# Patient Record
Sex: Male | Born: 1935
Health system: Southern US, Community
[De-identification: ages and names within clinical notes are randomized; demographics above are authoritative.]

## PROBLEM LIST (undated history)

## (undated) DIAGNOSIS — N183 Chronic kidney disease, stage 3 (moderate): Secondary | ICD-10-CM

## (undated) DIAGNOSIS — C61 Malignant neoplasm of prostate: Secondary | ICD-10-CM

## (undated) DIAGNOSIS — J189 Pneumonia, unspecified organism: Secondary | ICD-10-CM

## (undated) DIAGNOSIS — Z8719 Personal history of other diseases of the digestive system: Secondary | ICD-10-CM

## (undated) DIAGNOSIS — K8050 Calculus of bile duct without cholangitis or cholecystitis without obstruction: Secondary | ICD-10-CM

## (undated) DIAGNOSIS — I2510 Atherosclerotic heart disease of native coronary artery without angina pectoris: Secondary | ICD-10-CM

## (undated) DIAGNOSIS — K219 Gastro-esophageal reflux disease without esophagitis: Secondary | ICD-10-CM

## (undated) DIAGNOSIS — I4892 Unspecified atrial flutter: Secondary | ICD-10-CM

## (undated) DIAGNOSIS — I4891 Unspecified atrial fibrillation: Secondary | ICD-10-CM

## (undated) DIAGNOSIS — E039 Hypothyroidism, unspecified: Secondary | ICD-10-CM

## (undated) DIAGNOSIS — M1990 Unspecified osteoarthritis, unspecified site: Secondary | ICD-10-CM

## (undated) DIAGNOSIS — E785 Hyperlipidemia, unspecified: Secondary | ICD-10-CM

## (undated) DIAGNOSIS — I499 Cardiac arrhythmia, unspecified: Secondary | ICD-10-CM

## (undated) DIAGNOSIS — I10 Essential (primary) hypertension: Secondary | ICD-10-CM

## (undated) DIAGNOSIS — N189 Chronic kidney disease, unspecified: Secondary | ICD-10-CM

## (undated) HISTORY — PX: PROSTATE SURGERY: SHX751

## (undated) HISTORY — DX: Unspecified osteoarthritis, unspecified site: M19.90

## (undated) HISTORY — PX: THORACOTOMY: SUR1349

## (undated) HISTORY — DX: Essential (primary) hypertension: I10

## (undated) HISTORY — DX: Gastro-esophageal reflux disease without esophagitis: K21.9

## (undated) HISTORY — PX: COLONOSCOPY: SHX174

## (undated) HISTORY — PX: CATARACT EXTRACTION: SUR2

## (undated) HISTORY — DX: Malignant neoplasm of prostate: C61

## (undated) HISTORY — PX: HERNIA REPAIR: SHX51

## (undated) HISTORY — PX: EYE SURGERY: SHX253

## (undated) HISTORY — DX: Hyperlipidemia, unspecified: E78.5

---

## 1998-11-09 ENCOUNTER — Inpatient Hospital Stay (HOSPITAL_COMMUNITY): Admission: EM | Admit: 1998-11-09 | Discharge: 1998-11-10 | Payer: Self-pay | Admitting: Emergency Medicine

## 1998-11-10 ENCOUNTER — Encounter: Payer: Self-pay | Admitting: *Deleted

## 2002-07-29 ENCOUNTER — Ambulatory Visit (HOSPITAL_COMMUNITY): Admission: RE | Admit: 2002-07-29 | Discharge: 2002-07-29 | Payer: Self-pay | Admitting: Gastroenterology

## 2002-07-29 ENCOUNTER — Encounter: Payer: Self-pay | Admitting: Gastroenterology

## 2011-12-07 ENCOUNTER — Other Ambulatory Visit: Payer: Self-pay | Admitting: Family Medicine

## 2011-12-07 ENCOUNTER — Encounter: Payer: Self-pay | Admitting: Gastroenterology

## 2011-12-07 DIAGNOSIS — R109 Unspecified abdominal pain: Secondary | ICD-10-CM

## 2011-12-07 DIAGNOSIS — R102 Pelvic and perineal pain: Secondary | ICD-10-CM

## 2011-12-13 ENCOUNTER — Ambulatory Visit (HOSPITAL_COMMUNITY)
Admission: RE | Admit: 2011-12-13 | Discharge: 2011-12-13 | Disposition: A | Discharge status: Home / Self Care | Payer: Medicare PPO | Source: Ambulatory Visit | Attending: Family Medicine | Admitting: Family Medicine

## 2011-12-13 DIAGNOSIS — R102 Pelvic and perineal pain: Secondary | ICD-10-CM

## 2011-12-13 DIAGNOSIS — R109 Unspecified abdominal pain: Secondary | ICD-10-CM

## 2011-12-13 DIAGNOSIS — K8020 Calculus of gallbladder without cholecystitis without obstruction: Secondary | ICD-10-CM | POA: Insufficient documentation

## 2012-01-07 ENCOUNTER — Encounter: Payer: Self-pay | Admitting: Gastroenterology

## 2012-01-15 ENCOUNTER — Ambulatory Visit (INDEPENDENT_AMBULATORY_CARE_PROVIDER_SITE_OTHER): Payer: Medicare PPO | Admitting: Gastroenterology

## 2012-01-15 ENCOUNTER — Encounter: Payer: Self-pay | Admitting: Gastroenterology

## 2012-01-15 VITALS — BP 124/60 | HR 62 | Ht 70.0 in | Wt 161.0 lb

## 2012-01-15 DIAGNOSIS — C61 Malignant neoplasm of prostate: Secondary | ICD-10-CM

## 2012-01-15 DIAGNOSIS — Z1211 Encounter for screening for malignant neoplasm of colon: Secondary | ICD-10-CM

## 2012-01-15 DIAGNOSIS — R634 Abnormal weight loss: Secondary | ICD-10-CM | POA: Insufficient documentation

## 2012-01-15 MED ORDER — PEG-KCL-NACL-NASULF-NA ASC-C 100 G PO SOLR: 1.0000 | Freq: Once | ORAL | Status: DC

## 2012-01-15 NOTE — Progress Notes (Signed)
History of Present Illness: [name] is a pleasant 78 year old white male referred at the request of Dr. Christell Constant for evaluation of weight loss. Over the past 3 months he's lost about 12 pounds. This is coincident with the loss of his wife. He was complaining of severe anorexia which  has improved.  He's had mild pyrosis. Recent lab work in April, 2013 was pertinent for normal liver tests and CBC. Last colonoscopy was 10 years ago.    Past Medical History  Diagnosis Date  . Prostate cancer   . GERD (gastroesophageal reflux disease)   . Hypertension   . Hyperlipemia   . Arthritis    Past Surgical History  Procedure Date  . Hernia repair   . Prostate surgery    family history is negative for Colon cancer. Current Outpatient Prescriptions  Medication Sig Dispense Refill  . amLODipine (NORVASC) 5 MG tablet One tablet by mouth once daily      . DEPO-TESTOSTERONE 200 MG/ML injection As directed      . diazepam (VALIUM) 5 MG tablet Take 5 mg by mouth as needed.      . hydrochlorothiazide (HYDRODIURIL) 25 MG tablet Takes one tablet by mouth once daily      . HYDROcodone-acetaminophen (NORCO) 5-325 MG per tablet Takes as directed      . lisinopril (PRINIVIL,ZESTRIL) 40 MG tablet One tablet by mouth once daily      . metoprolol tartrate (LOPRESSOR) 25 MG tablet One tablet by mouth once daily      . omeprazole (PRILOSEC) 40 MG capsule Takes one capsule by mouth once daily      . pramipexole (MIRAPEX) 1 MG tablet Takes as directed      . pravastatin (PRAVACHOL) 40 MG tablet Takes one tablet by mouth once daily      . sertraline (ZOLOFT) 50 MG tablet Takes as directed      . zolpidem (AMBIEN) 10 MG tablet Take 10 mg by mouth at bedtime as needed.       Allergies as of 01/15/2012 - Review Complete 01/15/2012  Allergen Reaction Noted  . Lipitor (atorvastatin)  01/15/2012    reports that he has quit smoking. He has never used smokeless tobacco. He reports that he does not drink alcohol or use  illicit drugs.     Review of Systems: Pertinent positive and negative review of systems were noted in the above HPI section. All other review of systems were otherwise negative.  Vital signs were reviewed in today's medical record Physical Exam: General: Well developed , well nourished, no acute distress Head: Normocephalic and atraumatic Eyes:  sclerae anicteric, EOMI Ears: Normal auditory acuity Mouth: No deformity or lesions Neck: Supple, no masses or thyromegaly Lungs: There were scattered inspiratory and expiratory wheezes Heart: Regular rate and rhythm; no murmurs, rubs or bruits Abdomen: Soft, non tender and non distended. No masses, hepatosplenomegaly or hernias noted. Normal Bowel sounds Rectal:deferred Musculoskeletal: Symmetrical with no gross deformities  Skin: No lesions on visible extremities Pulses:  Normal pulses noted Extremities: No clubbing, cyanosis, edema or deformities noted Neurological: Alert oriented x 4, grossly nonfocal Cervical Nodes:  No significant cervical adenopathy Inguinal Nodes: No significant inguinal adenopathy Psychological:  Alert and cooperative. Normal mood and affect

## 2012-01-15 NOTE — Assessment & Plan Note (Signed)
Weight loss likely is due to 2 depression. He has no specific symptoms that suggest a particular disorder.  Recommendations #1 no further workup for weight loss at this time.

## 2012-01-15 NOTE — Patient Instructions (Signed)
Colonoscopy A colonoscopy is an exam to evaluate your entire colon. In this exam, your colon is cleansed. A long fiberoptic tube is inserted through your rectum and into your colon. The fiberoptic scope (endoscope) is a long bundle of enclosed and very flexible fibers. These fibers transmit light to the area examined and send images from that area to your caregiver. Discomfort is usually minimal. You may be given a drug to help you sleep (sedative) during or prior to the procedure. This exam helps to detect lumps (tumors), polyps, inflammation, and areas of bleeding. Your caregiver may also take a small piece of tissue (biopsy) that will be examined under a microscope. LET YOUR CAREGIVER KNOW ABOUT:   Allergies to food or medicine.   Medicines taken, including vitamins, herbs, eyedrops, over-the-counter medicines, and creams.   Use of steroids (by mouth or creams).   Previous problems with anesthetics or numbing medicines.   History of bleeding problems or blood clots.   Previous surgery.   Other health problems, including diabetes and kidney problems.   Possibility of pregnancy, if this applies.  BEFORE THE PROCEDURE   A clear liquid diet may be required for 2 days before the exam.   Ask your caregiver about changing or stopping your regular medications.   Liquid injections (enemas) or laxatives may be required.   A large amount of electrolyte solution may be given to you to drink over a short period of time. This solution is used to clean out your colon.   You should be present 60 minutes prior to your procedure or as directed by your caregiver.  AFTER THE PROCEDURE   If you received a sedative or pain relieving medication, you will need to arrange for someone to drive you home.   Occasionally, there is a little blood passed with the first bowel movement. Do not be concerned.  FINDING OUT THE RESULTS OF YOUR TEST Not all test results are available during your visit. If your test  results are not back during the visit, make an appointment with your caregiver to find out the results. Do not assume everything is normal if you have not heard from your caregiver or the medical facility. It is important for you to follow up on all of your test results. HOME CARE INSTRUCTIONS   It is not unusual to pass moderate amounts of gas and experience mild abdominal cramping following the procedure. This is due to air being used to inflate your colon during the exam. Walking or a warm pack on your belly (abdomen) may help.   You may resume all normal meals and activities after sedatives and medicines have worn off.   Only take over-the-counter or prescription medicines for pain, discomfort, or fever as directed by your caregiver. Do not use aspirin or blood thinners if a biopsy was taken. Consult your caregiver for medicine usage if biopsies were taken.  SEEK IMMEDIATE MEDICAL CARE IF:   You have a fever.   You pass large blood clots or fill a toilet with blood following the procedure. This may also occur 10 to 14 days following the procedure. This is more likely if a biopsy was taken.   You develop abdominal pain that keeps getting worse and cannot be relieved with medicine.  Document Released: 08/03/2000 Document Revised: 07/26/2011 Document Reviewed: 03/18/2008 [employer] Patient Information 2012 [employer]. 

## 2012-01-15 NOTE — Assessment & Plan Note (Signed)
Plan screening colonoscopy 

## 2012-01-19 HISTORY — PX: ESOPHAGOGASTRODUODENOSCOPY: SHX1529

## 2012-01-21 ENCOUNTER — Telehealth: Payer: Self-pay | Admitting: Gastroenterology

## 2012-01-21 NOTE — Telephone Encounter (Signed)
Patient to come in the office to get free moviprepcoupin

## 2012-01-22 ENCOUNTER — Telehealth: Payer: Self-pay | Admitting: Gastroenterology

## 2012-01-22 NOTE — Telephone Encounter (Signed)
Spoke with patient. Will make coupon to patient today

## 2012-01-29 ENCOUNTER — Encounter: Payer: Self-pay | Admitting: Gastroenterology

## 2012-01-29 ENCOUNTER — Ambulatory Visit (AMBULATORY_SURGERY_CENTER): Payer: Medicare PPO | Admitting: Gastroenterology

## 2012-01-29 VITALS — BP 116/75 | HR 58 | Temp 97.9°F | Resp 15 | Ht 70.0 in | Wt 161.0 lb

## 2012-01-29 DIAGNOSIS — Z1211 Encounter for screening for malignant neoplasm of colon: Secondary | ICD-10-CM

## 2012-01-29 DIAGNOSIS — K5730 Diverticulosis of large intestine without perforation or abscess without bleeding: Secondary | ICD-10-CM

## 2012-01-29 DIAGNOSIS — K5520 Angiodysplasia of colon without hemorrhage: Secondary | ICD-10-CM

## 2012-01-29 DIAGNOSIS — Q2733 Arteriovenous malformation of digestive system vessel: Secondary | ICD-10-CM

## 2012-01-29 DIAGNOSIS — R634 Abnormal weight loss: Secondary | ICD-10-CM

## 2012-01-29 MED ORDER — SODIUM CHLORIDE 0.9 % IV SOLN: 500.0000 mL | INTRAVENOUS | Status: DC

## 2012-01-29 NOTE — Progress Notes (Signed)
Patient did not have preoperative order for IV antibiotic SSI prophylaxis. (G8918)  Patient did not experience any of the following events: a burn prior to discharge; a fall within the facility; wrong site/side/patient/procedure/implant event; or a hospital transfer or hospital admission upon discharge from the facility. (G8907)  

## 2012-01-29 NOTE — Op Note (Signed)
[facility] [address] [place], [place]  [address]  COLONOSCOPY PROCEDURE REPORT  PATIENT:  [name], [name]  MR#:  [id number] BIRTHDATE:  09/24/1926, 84 yrs. old  GENDER:  male ENDOSCOPIST:  Scott Hair. Arlyce Dice, MD REF. BY:  Rudi Heap, M.D. PROCEDURE DATE:  01/29/2012 PROCEDURE:  Diagnostic Colonoscopy ASA CLASS:  Class II INDICATIONS:  weight loss MEDICATIONS:   MAC sedation, administered by CRNA propofol 150mg IV  DESCRIPTION OF PROCEDURE:   After the risks benefits and alternatives of the procedure were thoroughly explained, informed consent was obtained.  Digital rectal exam was performed and revealed no abnormalities.   The LB [id number] endoscope was introduced through the anus and advanced to the cecum, which was identified by both the appendix and ileocecal valve, without limitations.  The quality of the prep was excellent, using MoviPrep.  The instrument was then slowly withdrawn as the colon was fully examined. <<PROCEDUREIMAGES>>  FINDINGS:  Arteriovenous malformations were seen in the in the cecum (see image2). 3mm nonbleeding AVM  Mild diverticulosis was found in the sigmoid colon (see image6).  This was otherwise a normal examination of the colon (see image1 and image7). Retroflexed views in the rectum revealed no abnormalities.    The time to cecum =  1) 4.50  minutes. The scope was then withdrawn in 1) 6.25  minutes from the cecum and the procedure completed. 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 Arlyce Dice, MD  CC:  n. eSIGNED:   [name] at 01/29/2012 10:44 AM  Kandice Moos, [phone]

## 2012-01-29 NOTE — Patient Instructions (Signed)
YOU HAD AN ENDOSCOPIC PROCEDURE TODAY AT THE [facility]: Refer to the procedure report that was given to you for any specific questions about what was found during the examination.  If the procedure report does not answer your questions, please call your gastroenterologist to clarify.  If you requested that your care partner not be given the details of your procedure findings, then the procedure report has been included in a sealed envelope for you to review at your convenience later.  YOU SHOULD EXPECT: Some feelings of bloating in the abdomen. Passage of more gas than usual.  Walking can help get rid of the air that was put into your GI tract during the procedure and reduce the bloating. If you had a lower endoscopy (such as a colonoscopy or flexible sigmoidoscopy) you may notice spotting of blood in your stool or on the toilet paper. If you underwent a bowel prep for your procedure, then you may not have a normal bowel movement for a few days.  DIET: Your first meal following the procedure should be a light meal and then it is ok to progress to your normal diet.  A half-sandwich or bowl of soup is an example of a good first meal.  Heavy or fried foods are harder to digest and may make you feel nauseous or bloated.  Likewise meals heavy in dairy and vegetables can cause extra gas to form and this can also increase the bloating.  Drink plenty of fluids but you should avoid alcoholic beverages for 24 hours.  ACTIVITY: Your care partner should take you home directly after the procedure.  You should plan to take it easy, moving slowly for the rest of the day.  You can resume normal activity the day after the procedure however you should NOT DRIVE or use heavy machinery for 24 hours (because of the sedation medicines used during the test).    SYMPTOMS TO REPORT IMMEDIATELY: A gastroenterologist can be reached at any hour.  During normal business hours, 8:30 AM to 5:00 PM Monday through Friday,  call [phone].  After hours and on weekends, please call the GI answering service at [phone] who will take a message and have the physician on call contact you.   Following lower endoscopy (colonoscopy or flexible sigmoidoscopy):  Excessive amounts of blood in the stool  Significant tenderness or worsening of abdominal pains  Swelling of the abdomen that is new, acute  Fever of 100F or higher    FOLLOW UP: If any biopsies were taken you will be contacted by phone or by letter within the next 1-3 weeks.  Call your gastroenterologist if you have not heard about the biopsies in 3 weeks.  Our staff will call the home number listed on your records the next business day following your procedure to check on you and address any questions or concerns that you may have at that time regarding the information given to you following your procedure. This is a courtesy call and so if there is no answer at the home number and we have not heard from you through the emergency physician on call, we will assume that you have returned to your regular daily activities without incident.  SIGNATURES/CONFIDENTIALITY: You and/or your care partner have signed paperwork which will be entered into your electronic medical record.  These signatures attest to the fact that that the information above on your After Visit Summary has been reviewed and is understood.  Full responsibility of the confidentiality   of this discharge information lies with you and/or your care-partner.     

## 2012-01-30 ENCOUNTER — Telehealth: Payer: Self-pay

## 2012-01-30 NOTE — Telephone Encounter (Signed)
  Follow up Call-  Call back number 01/29/2012  Post procedure Call Back phone  # [phone]  Permission to leave phone message Yes     Patient questions:  Do you have a fever, pain , or abdominal swelling? no Pain Score  0 *  Have you tolerated food without any problems? yes  Have you been able to return to your normal activities? yes  Do you have any questions about your discharge instructions: Diet   no Medications  no Follow up visit  no  Do you have questions or concerns about your Care? no  Actions: * If pain score is 4 or above: No action needed, pain <4.

## 2012-02-04 ENCOUNTER — Ambulatory Visit (AMBULATORY_SURGERY_CENTER): Payer: Medicare PPO | Admitting: *Deleted

## 2012-02-04 ENCOUNTER — Encounter: Payer: Self-pay | Admitting: Gastroenterology

## 2012-02-04 VITALS — Ht 72.0 in | Wt 164.0 lb

## 2012-02-04 DIAGNOSIS — K219 Gastro-esophageal reflux disease without esophagitis: Secondary | ICD-10-CM

## 2012-02-04 DIAGNOSIS — R634 Abnormal weight loss: Secondary | ICD-10-CM

## 2012-02-04 NOTE — Progress Notes (Signed)
Patient c/o dizziness since colonoscopy last week. BP checked 138/70. Instructed patient to contact his primary care physician, he understands.

## 2012-02-06 ENCOUNTER — Encounter: Payer: Self-pay | Admitting: Gastroenterology

## 2012-02-06 ENCOUNTER — Other Ambulatory Visit: Payer: Self-pay | Admitting: *Deleted

## 2012-02-06 ENCOUNTER — Ambulatory Visit (AMBULATORY_SURGERY_CENTER): Payer: Medicare PPO | Admitting: Gastroenterology

## 2012-02-06 VITALS — BP 139/75 | HR 72 | Temp 97.3°F | Resp 15 | Ht 72.0 in | Wt 164.0 lb

## 2012-02-06 DIAGNOSIS — K219 Gastro-esophageal reflux disease without esophagitis: Secondary | ICD-10-CM

## 2012-02-06 DIAGNOSIS — R109 Unspecified abdominal pain: Secondary | ICD-10-CM

## 2012-02-06 DIAGNOSIS — R634 Abnormal weight loss: Secondary | ICD-10-CM

## 2012-02-06 DIAGNOSIS — K449 Diaphragmatic hernia without obstruction or gangrene: Secondary | ICD-10-CM

## 2012-02-06 MED ORDER — SODIUM CHLORIDE 0.9 % IV SOLN: 500.0000 mL | INTRAVENOUS | Status: DC

## 2012-02-06 NOTE — Patient Instructions (Addendum)
YOU HAD AN ENDOSCOPIC PROCEDURE TODAY AT THE [facility]: Refer to the procedure report that was given to you for any specific questions about what was found during the examination.  If the procedure report does not answer your questions, please call your gastroenterologist to clarify.  If you requested that your care partner not be given the details of your procedure findings, then the procedure report has been included in a sealed envelope for you to review at your convenience later.  YOU SHOULD EXPECT: Some feelings of bloating in the abdomen. Passage of more gas than usual.  Walking can help get rid of the air that was put into your GI tract during the procedure and reduce the bloating. If you had a lower endoscopy (such as a colonoscopy or flexible sigmoidoscopy) you may notice spotting of blood in your stool or on the toilet paper. If you underwent a bowel prep for your procedure, then you may not have a normal bowel movement for a few days.  DIET: Your first meal following the procedure should be a light meal and then it is ok to progress to your normal diet.  A half-sandwich or bowl of soup is an example of a good first meal.  Heavy or fried foods are harder to digest and may make you feel nauseous or bloated.  Likewise meals heavy in dairy and vegetables can cause extra gas to form and this can also increase the bloating.  Drink plenty of fluids but you should avoid alcoholic beverages for 24 hours.  ACTIVITY: Your care partner should take you home directly after the procedure.  You should plan to take it easy, moving slowly for the rest of the day.  You can resume normal activity the day after the procedure however you should NOT DRIVE or use heavy machinery for 24 hours (because of the sedation medicines used during the test).    SYMPTOMS TO REPORT IMMEDIATELY: A gastroenterologist can be reached at any hour.  During normal business hours, 8:30 AM to 5:00 PM Monday through Friday,  call [phone].  After hours and on weekends, please call the GI answering service at [phone] who will take a message and have the physician on call contact you.     Following upper endoscopy (EGD)  Vomiting of blood or coffee ground material  New chest pain or pain under the shoulder blades  Painful or persistently difficult swallowing  New shortness of breath  Fever of 100F or higher  Black, tarry-looking stools  FOLLOW UP: If any biopsies were taken you will be contacted by phone or by letter within the next 1-3 weeks.  Call your gastroenterologist if you have not heard about the biopsies in 3 weeks.  Our staff will call the home number listed on your records the next business day following your procedure to check on you and address any questions or concerns that you may have at that time regarding the information given to you following your procedure. This is a courtesy call and so if there is no answer at the home number and we have not heard from you through the emergency physician on call, we will assume that you have returned to your regular daily activities without incident.    Hiatal Hernia information given to you today.  Office visit with Dr. Arlyce Dice in 6 weeks. --August 5th 2013 @ 08:45    SIGNATURES/CONFIDENTIALITY: You and/or your care partner have signed paperwork which will be entered into your electronic  medical record.  These signatures attest to the fact that that the information above on your After Visit Summary has been reviewed and is understood.  Full responsibility of the confidentiality of this discharge information lies with you and/or your care-partner.    Upper gi series to be ordered by r stallings per order dr Armando Reichert June 21st @ 9:30 ,arrive 9:15 @ [facility] . Nothing to eat or drink after midnight June 20th.

## 2012-02-06 NOTE — Progress Notes (Signed)
Patient did not experience any of the following events: a burn prior to discharge; a fall within the facility; wrong site/side/patient/procedure/implant event; or a hospital transfer or hospital admission upon discharge from the facility. (G8907) Patient did not have preoperative order for IV antibiotic SSI prophylaxis. (G8918)  

## 2012-02-06 NOTE — Op Note (Addendum)
[facility] [address] [place], [place]  [address]  ENDOSCOPY PROCEDURE REPORT  PATIENT:  [name], [name]  MR#:  [id number] BIRTHDATE:  1927-04-07, 84 yrs. old  GENDER:  male  ENDOSCOPIST:  Barbette Hair. Arlyce Dice, MD Referred by:  Rudi Heap, M.D.  PROCEDURE DATE:  02/06/2012 PROCEDURE:  EGD, diagnostic 43235 ASA CLASS:  Class III INDICATIONS:  GERD, weight loss  MEDICATIONS:   MAC sedation, administered by CRNA propofol 70mg IV TOPICAL ANESTHETIC:  DESCRIPTION OF PROCEDURE:   After the risks and benefits of the procedure were explained, informed consent was obtained.  The endoscope was introduced through the mouth and advanced to the third portion of the duodenum.  The instrument was slowly withdrawn as the mucosa was fully examined. <<PROCEDUREIMAGES>>  A hiatal hernia was found. 11cm sliding hiatal hernia  Otherwise the examination was normal (see image1, image2, image3, and image4).    Retroflexed views revealed no abnormalities.    The scope was then withdrawn from the patient and the procedure completed.  COMPLICATIONS:  None  ENDOSCOPIC IMPRESSION: 1) Large Hiatal hernia 2) Otherwise normal examination RECOMMENDATIONS: 1) OV in 6 weeks. 2) UGI series to help delineate anatomy  ______________________________ Barbette Hair. Arlyce Dice, MD  CC:  n. REVISED:  02/11/2012 12:37 PM eSIGNED:   Barbette Hair. Rease Swinson at 02/11/2012 12:37 PM  Kandice Moos, [phone]

## 2012-02-07 ENCOUNTER — Telehealth: Payer: Self-pay | Admitting: *Deleted

## 2012-02-07 NOTE — Telephone Encounter (Signed)
  Follow up Call-  Call back number 02/06/2012 01/29/2012  Post procedure Call Back phone  # [phone]  Permission to leave phone message Yes Yes     Patient questions:  Do you have a fever, pain , or abdominal swelling? no Pain Score  0 *  Have you tolerated food without any problems? yes  Have you been able to return to your normal activities? yes  Do you have any questions about your discharge instructions: Diet   no Medications  no Follow up visit  no  Do you have questions or concerns about your Care? yes Pt. Asked about upper GI series tomorrow.  Advised to Report to [facility] as  Scheduled  02/08/12.  Actions: * If pain score is 4 or above: No action needed, pain <4.

## 2012-02-08 ENCOUNTER — Ambulatory Visit (HOSPITAL_COMMUNITY)
Admission: RE | Admit: 2012-02-08 | Discharge: 2012-02-08 | Disposition: A | Discharge status: Home / Self Care | Payer: Medicare PPO | Source: Ambulatory Visit | Attending: Gastroenterology | Admitting: Gastroenterology

## 2012-02-08 DIAGNOSIS — R109 Unspecified abdominal pain: Secondary | ICD-10-CM | POA: Insufficient documentation

## 2012-02-08 DIAGNOSIS — K219 Gastro-esophageal reflux disease without esophagitis: Secondary | ICD-10-CM | POA: Insufficient documentation

## 2012-02-08 DIAGNOSIS — K449 Diaphragmatic hernia without obstruction or gangrene: Secondary | ICD-10-CM | POA: Insufficient documentation

## 2012-02-14 ENCOUNTER — Encounter: Payer: Medicare PPO | Admitting: Gastroenterology

## 2012-02-25 ENCOUNTER — Ambulatory Visit: Payer: Medicare PPO | Admitting: Gastroenterology

## 2012-03-24 ENCOUNTER — Encounter: Payer: Self-pay | Admitting: Gastroenterology

## 2012-03-24 ENCOUNTER — Ambulatory Visit (INDEPENDENT_AMBULATORY_CARE_PROVIDER_SITE_OTHER): Payer: Medicare PPO | Admitting: Gastroenterology

## 2012-03-24 ENCOUNTER — Ambulatory Visit: Payer: Medicare PPO | Admitting: Gastroenterology

## 2012-03-24 VITALS — BP 100/64 | HR 68 | Ht 70.0 in | Wt 163.4 lb

## 2012-03-24 DIAGNOSIS — K449 Diaphragmatic hernia without obstruction or gangrene: Secondary | ICD-10-CM

## 2012-03-24 NOTE — Patient Instructions (Addendum)
If you develop acute chest pain please contact the office immediatly

## 2012-03-24 NOTE — Assessment & Plan Note (Addendum)
He has a large paraesophageal hiatal hernia which currently is asymptomatic. I had alengthy discussion with [name] explaining the risk for incarceration of the stomach and subsequent infarction which could be a medical emergency. Since he's had no symptoms he  does not wish to proceed with hiatal hernia repair,  which was recommended. I carefully instructed him to contact the office immediately if he develops chest pain, or problems with indigestion or dysphagia develop.

## 2012-03-24 NOTE — Progress Notes (Signed)
History of Present Illness:  Mr. Keeter has returned following upper and lower endoscopy. The latter demonstrated an AVM. A large  hiatal hernia measuring over 11 cm was seen at endoscopy. Upper GI series revealed this to be a paraesophageal hiatal hernia. He has no GI complaints including dysphagia, chest pain or pyrosis.  He's gained weight since his last visit.    Review of Systems: Pertinent positive and negative review of systems were noted in the above HPI section. All other review of systems were otherwise negative.    Current Medications, Allergies, Past Medical History, Past Surgical History, Family History and Social History were reviewed in [employer] electronic medical record  Vital signs were reviewed in today's medical record. Physical Exam: General: Well developed , well nourished, no acute distress

## 2012-04-04 ENCOUNTER — Ambulatory Visit (INDEPENDENT_AMBULATORY_CARE_PROVIDER_SITE_OTHER): Payer: Medicare PPO | Admitting: Surgery

## 2012-04-04 VITALS — BP 130/76 | HR 72 | Temp 98.4°F | Resp 18 | Ht 71.0 in | Wt 164.0 lb

## 2012-04-04 DIAGNOSIS — K4090 Unilateral inguinal hernia, without obstruction or gangrene, not specified as recurrent: Secondary | ICD-10-CM

## 2012-04-04 NOTE — Progress Notes (Signed)
Re:   [name] DOB:   1927/01/29 MRN:   [id number]  ASSESSMENT AND PLAN: 1.  Left inguinal hernia  I discussed the indications and complications of hernia surgery with the patient.  I discussed both the laparoscopic and open approach to hernia repair. Because of her prostate surgery, he is not a candidate for laparoscopic surgery. The potential risks of hernia surgery include, but are not limited to, bleeding, infection, open surgery, nerve injury, and recurrence of the hernia.  I provided the patient literature about hernia surgery.  We will schedule this at a time convenient for him.   2.  Hypertension 3.  Bilateral knee problems - has seen Dr. Marlyne Beards in [place] 4.  Prostate cancer 5.  Old right chest wound - healed. 6.  GERD  [Note:  Epic was acting up during this entry.]  REFERRING PHYSICIAN: Rudi Heap, MD  HISTORY OF PRESENT ILLNESS: [name] is a 77 y.o. (DOB: 03/28/1927)  white male whose primary care physician is Rudi Heap, MD and comes to me today for left inguinal hernia.  He has noticed a buldge in his left groin about 1 month ago.  He said it comes out and stings.  It reduces when he lays down.  He thinks I did his right side many years ago.  His prior abdominal surgery was a prostatectomy by Dr. Retta Diones for prostate cancer in about 2001.  He has done well since that time.  He just had a colonoscopy by Dr. Arlyce Dice about 10 days ago. [We have requested the old chart which is not available at this time.]  Past Medical History  Diagnosis Date  . Prostate cancer   . GERD (gastroesophageal reflux disease)   . Hypertension   . Hyperlipemia   . Arthritis   . Inguinal hernia     left     Past Surgical History  Procedure Date  . Hernia repair   . Prostate surgery   . Colonoscopy     Current Outpatient Prescriptions  Medication Sig Dispense Refill  . amLODipine (NORVASC) 5 MG tablet One tablet by mouth once daily      . DEPO-TESTOSTERONE 200 MG/ML  injection As directed      . hydrochlorothiazide (HYDRODIURIL) 25 MG tablet Take 25 mg by mouth daily.      Marland Kitchen lisinopril (PRINIVIL,ZESTRIL) 40 MG tablet One tablet by mouth once daily      . metoprolol tartrate (LOPRESSOR) 25 MG tablet One tablet by mouth once daily      . omeprazole (PRILOSEC) 40 MG capsule Takes one capsule by mouth once daily      . pramipexole (MIRAPEX) 1 MG tablet Takes as directed      . pravastatin (PRAVACHOL) 40 MG tablet Takes one tablet by mouth once daily      . sertraline (ZOLOFT) 50 MG tablet Takes as directed      . zolpidem (AMBIEN) 10 MG tablet Take 10 mg by mouth at bedtime as needed.         Allergies  Allergen Reactions  . Lipitor (Atorvastatin) Other (See Comments)    "feel bad"    REVIEW OF SYSTEMS: Skin:  No history of rash.  No history of abnormal moles. Infection:  No history of hepatitis or HIV.  No history of MRSA. Neurologic:  No history of stroke.  No history of seizure.  No history of headaches. Cardiac:  Hypertension x 10 years. Pulmonary:  Had pneumonia as a child, has  right thoracotomy scar.  Endocrine:  No diabetes. No thyroid disease. Gastrointestinal:  No history of stomach disease.  No history of liver disease.  No history of gall bladder disease.  No history of pancreas disease.  Colonoscopy by Dr. Arlyce Dice just within the last 10 days. Urologic:  Prostate Ca - treated with prostatectomy by Dr. Retta Diones - 2001.  He says that his PSA has been 0. Musculoskeletal:  Bilateral knee pain.  He has seen Dr. Marlyne Beards - [place].  He has decided against surgery so far. Hematologic:  No bleeding disorder.  No history of anemia.  Not anticoagulated. Psycho-social:  The patient is oriented.   The patient has no obvious psychologic or social impairment to understanding our conversation and plan.  SOCIAL and FAMILY HISTORY: Widowed.  His wife died in 11/12/11. He has one son, 59.  Lives in [place].  PHYSICAL EXAM: BP 130/76  Pulse 72  Temp  98.4 F (36.9 C)  Resp 18  Ht 5\' 11"  (1.803 m)  Wt 164 lb (74.39 kg)  BMI 22.87 kg/m2  General: WN WM who is alert and generally healthy appearing.  HEENT: Normal. Pupils equal. Neck: Supple. No mass.  No thyroid mass. Lymph Nodes:  No supraclavicular or cervical nodes. Lungs: Clear to auscultation and symmetric breath sounds.  Right posterior lateral chest wound. Heart:  RRR. No murmur or rub.  Abdomen: Soft. No mass. No tenderness. No hernia. Normal bowel sounds.  Scar right groin.  Medium reducible left inguinal hernia.  Both testicles are unremarkable. Rectal: Not done. Extremities:  Good strength and ROM  in upper and lower extremities. Neurologic:  Grossly intact to motor and sensory function. Psychiatric: Has normal mood and affect. Behavior is normal.   DATA REVIEWED: Notes from Dr. Christell Constant.  Ovidio Kin, MD,  [facility] [address] [place]  [address]   [place], [place]    [address] Phone:  [phone] FAX:  [phone]

## 2012-04-15 DIAGNOSIS — K4090 Unilateral inguinal hernia, without obstruction or gangrene, not specified as recurrent: Secondary | ICD-10-CM

## 2012-04-18 ENCOUNTER — Ambulatory Visit (INDEPENDENT_AMBULATORY_CARE_PROVIDER_SITE_OTHER): Payer: Medicare PPO | Admitting: Surgery

## 2012-04-18 ENCOUNTER — Telehealth (INDEPENDENT_AMBULATORY_CARE_PROVIDER_SITE_OTHER): Payer: Self-pay | Admitting: General Surgery

## 2012-04-18 NOTE — Telephone Encounter (Signed)
Pt called to ask if OK to cover incision, because his clothes are rubbing it.  Instructed him to cover area as needed for comfort.

## 2012-04-29 ENCOUNTER — Ambulatory Visit (INDEPENDENT_AMBULATORY_CARE_PROVIDER_SITE_OTHER): Payer: Medicare PPO | Admitting: Surgery

## 2012-04-29 VITALS — BP 120/82 | HR 60 | Temp 97.0°F | Resp 18 | Wt 165.0 lb

## 2012-04-29 DIAGNOSIS — K4090 Unilateral inguinal hernia, without obstruction or gangrene, not specified as recurrent: Secondary | ICD-10-CM

## 2012-04-29 NOTE — Progress Notes (Signed)
Post Op Note  (see full note 04/04/2012)  He has done well open left inguinal hernia repair on 04/15/2012 at [facility]..  Sore the first week, but better now.  Incision looks good.  Wants to mow his lawn, which is okay.  No lifting > 15 pounds for 2 more weeks.  Return appt PRN.  Ovidio Kin, MD, [facility] Pager: [phone] Office phone:  [phone]

## 2012-05-14 ENCOUNTER — Encounter (INDEPENDENT_AMBULATORY_CARE_PROVIDER_SITE_OTHER): Payer: Self-pay

## 2012-11-11 ENCOUNTER — Ambulatory Visit (INDEPENDENT_AMBULATORY_CARE_PROVIDER_SITE_OTHER): Payer: Medicare PPO | Admitting: *Deleted

## 2012-11-11 DIAGNOSIS — E291 Testicular hypofunction: Secondary | ICD-10-CM

## 2012-11-11 MED ORDER — TESTOSTERONE CYPIONATE 200 MG/ML IM SOLN
300.0000 mg | INTRAMUSCULAR | Status: AC
  Administered 2012-11-11 – 2013-06-25 (×10): 300 mg via INTRAMUSCULAR

## 2012-11-11 NOTE — Progress Notes (Signed)
Pt tolerated testosterone with no problems.

## 2012-11-28 ENCOUNTER — Ambulatory Visit (INDEPENDENT_AMBULATORY_CARE_PROVIDER_SITE_OTHER): Payer: Medicare PPO | Admitting: *Deleted

## 2012-11-28 ENCOUNTER — Other Ambulatory Visit (INDEPENDENT_AMBULATORY_CARE_PROVIDER_SITE_OTHER): Payer: Medicare PPO

## 2012-11-28 DIAGNOSIS — Z1212 Encounter for screening for malignant neoplasm of rectum: Secondary | ICD-10-CM

## 2012-11-28 DIAGNOSIS — E291 Testicular hypofunction: Secondary | ICD-10-CM

## 2012-11-28 NOTE — Progress Notes (Signed)
Patient dropped off fobt 

## 2012-11-29 LAB — FECAL OCCULT BLOOD, IMMUNOCHEMICAL: Fecal Occult Blood: NEGATIVE

## 2012-12-19 ENCOUNTER — Ambulatory Visit (INDEPENDENT_AMBULATORY_CARE_PROVIDER_SITE_OTHER): Payer: Medicare PPO | Admitting: Family Medicine

## 2012-12-19 DIAGNOSIS — E291 Testicular hypofunction: Secondary | ICD-10-CM

## 2013-01-13 ENCOUNTER — Encounter: Payer: Self-pay | Admitting: *Deleted

## 2013-01-20 ENCOUNTER — Ambulatory Visit (INDEPENDENT_AMBULATORY_CARE_PROVIDER_SITE_OTHER): Payer: Medicare PPO | Admitting: Family Medicine

## 2013-01-20 DIAGNOSIS — E291 Testicular hypofunction: Secondary | ICD-10-CM

## 2013-02-04 ENCOUNTER — Ambulatory Visit: Payer: Self-pay | Admitting: Family Medicine

## 2013-02-17 ENCOUNTER — Ambulatory Visit (INDEPENDENT_AMBULATORY_CARE_PROVIDER_SITE_OTHER): Payer: Medicare PPO | Admitting: Family Medicine

## 2013-02-17 ENCOUNTER — Encounter: Payer: Self-pay | Admitting: Family Medicine

## 2013-02-17 ENCOUNTER — Ambulatory Visit (INDEPENDENT_AMBULATORY_CARE_PROVIDER_SITE_OTHER): Payer: Medicare PPO

## 2013-02-17 VITALS — BP 141/75 | HR 55 | Temp 97.5°F | Ht 69.5 in | Wt 163.2 lb

## 2013-02-17 DIAGNOSIS — R5383 Other fatigue: Secondary | ICD-10-CM

## 2013-02-17 DIAGNOSIS — H6120 Impacted cerumen, unspecified ear: Secondary | ICD-10-CM

## 2013-02-17 DIAGNOSIS — M25569 Pain in unspecified knee: Secondary | ICD-10-CM

## 2013-02-17 DIAGNOSIS — H6121 Impacted cerumen, right ear: Secondary | ICD-10-CM

## 2013-02-17 DIAGNOSIS — E349 Endocrine disorder, unspecified: Secondary | ICD-10-CM

## 2013-02-17 DIAGNOSIS — N400 Enlarged prostate without lower urinary tract symptoms: Secondary | ICD-10-CM

## 2013-02-17 DIAGNOSIS — M25562 Pain in left knee: Secondary | ICD-10-CM

## 2013-02-17 DIAGNOSIS — W57XXXA Bitten or stung by nonvenomous insect and other nonvenomous arthropods, initial encounter: Secondary | ICD-10-CM

## 2013-02-17 DIAGNOSIS — E291 Testicular hypofunction: Secondary | ICD-10-CM

## 2013-02-17 DIAGNOSIS — E559 Vitamin D deficiency, unspecified: Secondary | ICD-10-CM

## 2013-02-17 DIAGNOSIS — T148 Other injury of unspecified body region: Secondary | ICD-10-CM

## 2013-02-17 DIAGNOSIS — I10 Essential (primary) hypertension: Secondary | ICD-10-CM

## 2013-02-17 DIAGNOSIS — R5381 Other malaise: Secondary | ICD-10-CM

## 2013-02-17 DIAGNOSIS — E785 Hyperlipidemia, unspecified: Secondary | ICD-10-CM

## 2013-02-17 LAB — POCT CBC
Granulocyte percent: 84 %G — AB (ref 37–80)
Lymph, poc: 1 (ref 0.6–3.4)
MCHC: 34.4 g/dL (ref 31.8–35.4)
MCV: 92.6 fL (ref 80–97)
POC Granulocyte: 6.9 (ref 2–6.9)
Platelet Count, POC: 219 10*3/uL (ref 142–424)
RDW, POC: 13.4 %
WBC: 8.2 10*3/uL (ref 4.6–10.2)

## 2013-02-17 LAB — BASIC METABOLIC PANEL WITH GFR
BUN: 17 mg/dL (ref 6–23)
Calcium: 10.4 mg/dL (ref 8.4–10.5)
Creat: 1.33 mg/dL (ref 0.50–1.35)
GFR, Est African American: 56 mL/min — ABNORMAL LOW
GFR, Est Non African American: 49 mL/min — ABNORMAL LOW
Glucose, Bld: 87 mg/dL (ref 70–99)
Potassium: 4.5 mEq/L (ref 3.5–5.3)

## 2013-02-17 LAB — HEPATIC FUNCTION PANEL
ALT: 14 U/L (ref 0–53)
AST: 21 U/L (ref 0–37)
Albumin: 4.1 g/dL (ref 3.5–5.2)
Bilirubin, Direct: 0.2 mg/dL (ref 0.0–0.3)
Total Bilirubin: 1.1 mg/dL (ref 0.3–1.2)

## 2013-02-17 MED ORDER — DOXYCYCLINE HYCLATE 100 MG PO TABS: 100.0000 mg | ORAL_TABLET | Freq: Two times a day (BID) | ORAL | Status: DC

## 2013-02-17 MED ORDER — OXYBUTYNIN CHLORIDE ER 10 MG PO TB24: 10.0000 mg | ORAL_TABLET | Freq: Every day | ORAL | Status: DC

## 2013-02-17 NOTE — Progress Notes (Signed)
Subjective:    Patient ID: [name], male    DOB: [birth year], 79 y.o.   MRN: [id number]  HPI Patient returns to clinic today for followup of chronic medical problems. These include hypertension, hyperlipidemia, GERD, and history of coronary artery disease. He only thing outstanding on his health maintenance is his need for a shingles shot. He has also seen a urologist, and was started on oxybutynin. This is helped with bladder control. He is he sees the urologist once yearly. He has a form for a right knee brace. We will probably encourage him to see the orthopedist before he gets a brace. He has a history of a prostatectomy for prostate cancer about 16 years ago.   Review of Systems  Constitutional: Positive for fatigue (no energy).  HENT: Negative.   Respiratory: Positive for cough (slightly prod, clear).   Cardiovascular: Negative.   Gastrointestinal: Negative.   Endocrine: Negative.   Genitourinary: Positive for urgency (treated by Dr Retta Diones).  Musculoskeletal: Positive for back pain (LBP) and arthralgias (bilateral knees, L hip).  Skin: Positive for wound (LL leg,tick bite on 6/23, continued redness).  Allergic/Immunologic: Negative.   Neurological: Negative.   Psychiatric/Behavioral: Negative.    Patient also    Objective:   Physical Exam BP 141/75  Pulse 55  Temp(Src) 97.5 F (36.4 C) (Oral)  Ht 5' 9.5" (1.765 m)  Wt 163 lb 3.2 oz (74.027 kg)  BMI 23.76 kg/m2  The patient appeared well nourished and normally developed for his age, alert and oriented to time and place. Speech, behavior and judgement appear normal. Vital signs as documented.  Head exam is unremarkable. No scleral icterus or pallor noted. He has impacted ear cerumen on the right. Throat mouth and nasal passages are clear.  Neck is without jugular venous distension, thyromegally, or carotid bruits. Carotid upstrokes are brisk bilaterally. No cervical adenopathy. Lungs are clear anteriorly and  posteriorly to auscultation. Normal respiratory effort. Cardiac exam reveals regular rate and rhythm at 60 per minute. First and second heart sounds normal.  No murmurs, rubs or gallops.  Abdominal exam reveals normal bowl sounds, no masses, no organomegaly and no aortic enlargement. No inguinal adenopathy. Extremities are nonedematous and both femoral and pedal pulses are normal. There is grating with flexion and extension of the right knee. The left knee seems to be more mobile. Skin without pallor or jaundice.  Warm and dry, without rash except for atopic dermatitis around a tick bite on the left calf posteriorly. Neurologic exam reveals normal deep tendon reflexes and normal sensation.  [facility] reading (PRIMARY) by  Dr. Christell Constant: Right knee  : Severe degenerative changes                                      Assessment & Plan:  1. Fatigue - POCT CBC; Standing - Vitamin D 25 hydroxy; Standing - BASIC METABOLIC PANEL WITH GFR; Standing - POCT CBC - Vitamin D 25 hydroxy - BASIC METABOLIC PANEL WITH GFR  2. Vitamin D deficiency - Vitamin D 25 hydroxy; Standing - Vitamin D 25 hydroxy  3. Hyperlipemia - Hepatic function panel; Standing - NMR Lipoprofile with Lipids; Standing - Hepatic function panel - NMR Lipoprofile with Lipids  4. Hypertension - POCT CBC; Standing - BASIC METABOLIC PANEL WITH GFR; Standing - POCT CBC - BASIC METABOLIC PANEL WITH GFR  5. Knee pain, acute, right - Ambulatory referral to Orthopedic  Surgery -X-ray of right knee  6. Right ear impacted cerumen -Debrox  7. Tick bite - doxycycline (VIBRA-TABS) 100 MG tablet; Take 1 tablet (100 mg total) by mouth 2 (two) times daily.  Dispense: 20 tablet; Refill: 0  8. Testosterone deficiency -He will receive his testosterone injection today  Patient Instructions  Fall precautions discussed Continue current meds and therapeutic lifestyle changes Use Debrox to help soft and ear wax in right ear  [place] Cortisone 10 over-the-counter applied sparingly to area of tick bite a couple times a day for 7-10 day Take meds as directed We will arrange an appointment with orthopedic surgeon to get your right knee   Wood Tick Bite Ticks are insects that attach themselves to the skin. Most tick bites are harmless, but sometimes ticks carry diseases that can make a person quite ill. The chance of getting ill depends on:  The kind of tick that bites you.  Time of year.  How long the tick is attached.  Geographic location. Wood ticks are also called dog ticks. They are generally black. They can have white markings. They live in shrubs and grassy areas. They are larger than deer ticks. Wood ticks are about the size of a watermelon seed. They have a hard body. The most common places for ticks to attach themselves are the scalp, neck, armpits, waist, and groin. Wood ticks may stay attached for up to 2 weeks. TICKS MUST BE REMOVED AS SOON AS POSSIBLE TO HELP PREVENT DISEASES CAUSED BY TICK BITES.  TO REMOVE A TICK: 1. If available, put on latex gloves before trying to remove a tick. 2. Grasp the tick as close to the skin as possible, with curved forceps, fine tweezers or a special tick removal tool. 3. Pull gently with steady pressure until the tick lets go. Do not twist the tick or jerk it suddenly. This may break off the tick's head or mouth parts. 4. Do not crush the tick's body. This could force disease-carrying fluids from the tick into your body. 5. After the tick is removed, wash the bite area and your hands with soap and water or other disinfectant. 6. Apply a small amount of antiseptic cream or ointment to the bite site. 7. Wash and disinfect any instruments that were used. 8. Save the tick in a jar or plastic bag for later identification. Preserve the tick with a bit of alcohol or put it in the freezer. 9. Do not apply a hot match, petroleum jelly, or fingernail polish to the tick. This does  not work and may increase the chances of disease from the tick bite. YOU MAY NEED TO SEE YOUR CAREGIVER FOR A TETANUS SHOT NOW IF:  You have no idea when you had the last one.  You have never had a tetanus shot before. If you need a tetanus shot, and you decide not to get one, there is a rare chance of getting tetanus. Sickness from tetanus can be serious. If you get a tetanus shot, your arm may swell, get red and warm to the touch at the shot site. This is common and not a problem. PREVENTION  Wear protective clothing. Long sleeves and pants are best.  Wear white clothes to see ticks more easily  Tuck your pant legs into your socks.  If walking on trail, stay in the middle of the trail to avoid brushing against bushes.  Put insect repellent on all exposed skin and along boot tops, pant legs and sleeve cuffs  Check clothing, hair and skin repeatedly and before coming inside.  Brush off any ticks that are not attached. SEEK MEDICAL CARE IF:   You cannot remove a tick or part of the tick that is left in the skin.  Unexplained fever.  Redness and swelling in the area of the tick bite.  Tender, swollen lymph glands.  Diarrhea.  Weight loss.  Cough.  Fatigue.  Muscle, joint or bone pain.  Belly pain.  Headache.  Rash. SEEK IMMEDIATE MEDICAL CARE IF:   You develop an oral temperature above 102 F (38.9 C).  You are having trouble walking or moving your legs.  Numbness in the legs.  Shortness of breath.  Confusion.  Repeated vomiting. Document Released: 08/03/2000 Document Revised: 10/29/2011 Document Reviewed: 07/12/2008 [facility] Patient Information 2014 [place], [place].     Nyra Capes MD

## 2013-02-17 NOTE — Patient Instructions (Addendum)
Fall precautions discussed Continue current meds and therapeutic lifestyle changes Use Debrox to help soft and ear wax in right ear canal Cortisone 10 over-the-counter applied sparingly to area of tick bite a couple times a day for 7-10 day Take meds as directed We will arrange an appointment with orthopedic surgeon to get your right knee   Wood Tick Bite Ticks are insects that attach themselves to the skin. Most tick bites are harmless, but sometimes ticks carry diseases that can make a person quite ill. The chance of getting ill depends on:  The kind of tick that bites you.  Time of year.  How long the tick is attached.  Geographic location. Wood ticks are also called dog ticks. They are generally black. They can have white markings. They live in shrubs and grassy areas. They are larger than deer ticks. Wood ticks are about the size of a watermelon seed. They have a hard body. The most common places for ticks to attach themselves are the scalp, neck, armpits, waist, and groin. Wood ticks may stay attached for up to 2 weeks. TICKS MUST BE REMOVED AS SOON AS POSSIBLE TO HELP PREVENT DISEASES CAUSED BY TICK BITES.  TO REMOVE A TICK: 1. If available, put on latex gloves before trying to remove a tick. 2. Grasp the tick as close to the skin as possible, with curved forceps, fine tweezers or a special tick removal tool. 3. Pull gently with steady pressure until the tick lets go. Do not twist the tick or jerk it suddenly. This may break off the tick's head or mouth parts. 4. Do not crush the tick's body. This could force disease-carrying fluids from the tick into your body. 5. After the tick is removed, wash the bite area and your hands with soap and water or other disinfectant. 6. Apply a small amount of antiseptic cream or ointment to the bite site. 7. Wash and disinfect any instruments that were used. 8. Save the tick in a jar or plastic bag for later identification. Preserve the tick with  a bit of alcohol or put it in the freezer. 9. Do not apply a hot match, petroleum jelly, or fingernail polish to the tick. This does not work and may increase the chances of disease from the tick bite. YOU MAY NEED TO SEE YOUR CAREGIVER FOR A TETANUS SHOT NOW IF:  You have no idea when you had the last one.  You have never had a tetanus shot before. If you need a tetanus shot, and you decide not to get one, there is a rare chance of getting tetanus. Sickness from tetanus can be serious. If you get a tetanus shot, your arm may swell, get red and warm to the touch at the shot site. This is common and not a problem. PREVENTION  Wear protective clothing. Long sleeves and pants are best.  Wear white clothes to see ticks more easily  Tuck your pant legs into your socks.  If walking on trail, stay in the middle of the trail to avoid brushing against bushes.  Put insect repellent on all exposed skin and along boot tops, pant legs and sleeve cuffs  Check clothing, hair and skin repeatedly and before coming inside.  Brush off any ticks that are not attached. SEEK MEDICAL CARE IF:   You cannot remove a tick or part of the tick that is left in the skin.  Unexplained fever.  Redness and swelling in the area of the tick bite.  Tender, swollen lymph glands.  Diarrhea.  Weight loss.  Cough.  Fatigue.  Muscle, joint or bone pain.  Belly pain.  Headache.  Rash. SEEK IMMEDIATE MEDICAL CARE IF:   You develop an oral temperature above 102 F (38.9 C).  You are having trouble walking or moving your legs.  Numbness in the legs.  Shortness of breath.  Confusion.  Repeated vomiting. Document Released: 08/03/2000 Document Revised: 10/29/2011 Document Reviewed: 07/12/2008 [facility] Patient Information 2014 [place], [place].

## 2013-02-18 ENCOUNTER — Telehealth: Payer: Self-pay

## 2013-02-18 LAB — NMR LIPOPROFILE WITH LIPIDS
Cholesterol, Total: 123 mg/dL (ref <200)
HDL Size: 9.3 nm (ref >=9.2)
LDL (calc): 59 mg/dL (ref <100)
LDL Particle Number: 1062 nmol/L — ABNORMAL HIGH (ref <1000)
LP-IR Score: 39 (ref <=45)
Large VLDL-P: 1 nmol/L (ref <=2.7)
Small LDL Particle Number: 748 nmol/L — ABNORMAL HIGH (ref <=527)
VLDL Size: 52.5 nm — ABNORMAL HIGH (ref <=46.6)

## 2013-03-02 ENCOUNTER — Other Ambulatory Visit: Payer: Self-pay | Admitting: *Deleted

## 2013-03-02 ENCOUNTER — Telehealth: Payer: Self-pay | Admitting: Family Medicine

## 2013-03-02 DIAGNOSIS — M25561 Pain in right knee: Secondary | ICD-10-CM

## 2013-03-02 MED ORDER — DICLOFENAC SODIUM 75 MG PO TBEC: 75.0000 mg | DELAYED_RELEASE_TABLET | ORAL | Status: DC

## 2013-03-02 NOTE — Progress Notes (Signed)
Pt notified of results RX  for Voltaren sent to [facility] Pt to rtc for repeat labs in 4 weeks

## 2013-03-11 ENCOUNTER — Ambulatory Visit (INDEPENDENT_AMBULATORY_CARE_PROVIDER_SITE_OTHER): Payer: Medicare PPO | Admitting: *Deleted

## 2013-03-11 DIAGNOSIS — R7989 Other specified abnormal findings of blood chemistry: Secondary | ICD-10-CM

## 2013-03-11 DIAGNOSIS — E291 Testicular hypofunction: Secondary | ICD-10-CM

## 2013-03-11 NOTE — Progress Notes (Signed)
Patient tolerated well.

## 2013-03-11 NOTE — Patient Instructions (Signed)
Testosterone injection What is this medicine? TESTOSTERONE (tes TOS ter one) is the main male hormone. It supports normal male development such as muscle growth, facial hair, and deep voice. It is used in males to treat low testosterone levels. This medicine may be used for other purposes; ask your health care provider or pharmacist if you have questions. What should I tell my health care provider before I take this medicine? They need to know if you have any of these conditions: -breast cancer -diabetes -heart disease -kidney disease -liver disease -lung disease -prostate cancer, enlargement -an unusual or allergic reaction to testosterone, other medicines, foods, dyes, or preservatives -pregnant or trying to get pregnant -breast-feeding How should I use this medicine? This medicine is for injection into a muscle. It is usually given by a health care professional in a hospital or clinic setting. Contact your pediatrician regarding the use of this medicine in children. While this medicine may be prescribed for children as young as 12 years of age for selected conditions, precautions do apply. Overdosage: If you think you have taken too much of this medicine contact a poison control center or emergency room at once. NOTE: This medicine is only for you. Do not share this medicine with others. What if I miss a dose? Try not to miss a dose. Your doctor or health care professional will tell you when your next injection is due. Notify the office if you are unable to keep an appointment. What may interact with this medicine? -medicines for diabetes -medicines that treat or prevent blood clots like warfarin -oxyphenbutazone -propranolol -steroid medicines like prednisone or cortisone This list may not describe all possible interactions. Give your health care provider a list of all the medicines, herbs, non-prescription drugs, or dietary supplements you use. Also tell them if you smoke, drink  alcohol, or use illegal drugs. Some items may interact with your medicine. What should I watch for while using this medicine? Visit your doctor or health care professional for regular checks on your progress. They will need to check the level of testosterone in your blood. This medicine may affect blood sugar levels. If you have diabetes, check with your doctor or health care professional before you change your diet or the dose of your diabetic medicine. This drug is banned from use in athletes by most athletic organizations. What side effects may I notice from receiving this medicine? Side effects that you should report to your doctor or health care professional as soon as possible: -allergic reactions like skin rash, itching or hives, swelling of the face, lips, or tongue -breast enlargement -breathing problems -changes in mood, especially anger, depression, or rage -dark urine -general ill feeling or flu-like symptoms -light-colored stools -loss of appetite, nausea -nausea, vomiting -right upper belly pain -stomach pain -swelling of ankles -too frequent or persistent erections -trouble passing urine or change in the amount of urine -unusually weak or tired -yellowing of the eyes or skin Additional side effects that can occur in women include: -deep or hoarse voice -facial hair growth -irregular menstrual periods Side effects that usually do not require medical attention (report to your doctor or health care professional if they continue or are bothersome): -acne -change in sex drive or performance -hair loss -headache This list may not describe all possible side effects. Call your doctor for medical advice about side effects. You may report side effects to FDA at 1-800-FDA-1088. Where should I keep my medicine? Keep out of the reach of children. This medicine   can be abused. Keep your medicine in a safe place to protect it from theft. Do not share this medicine with anyone.  Selling or giving away this medicine is dangerous and against the law. Store at room temperature between 20 and 25 degrees C (68 and 77 degrees F). Do not freeze. Protect from light. Follow the directions for the product you are prescribed. Throw away any unused medicine after the expiration date. NOTE: This sheet is a summary. It may not cover all possible information. If you have questions about this medicine, talk to your doctor, pharmacist, or health care provider.  2012, [employer]. (10/17/2007 4:13:46 PM) 

## 2013-03-17 ENCOUNTER — Other Ambulatory Visit: Payer: Self-pay | Admitting: *Deleted

## 2013-03-17 DIAGNOSIS — M25561 Pain in right knee: Secondary | ICD-10-CM

## 2013-04-01 ENCOUNTER — Ambulatory Visit (INDEPENDENT_AMBULATORY_CARE_PROVIDER_SITE_OTHER): Payer: Medicare PPO | Admitting: Family Medicine

## 2013-04-01 DIAGNOSIS — E291 Testicular hypofunction: Secondary | ICD-10-CM

## 2013-04-22 ENCOUNTER — Ambulatory Visit (INDEPENDENT_AMBULATORY_CARE_PROVIDER_SITE_OTHER): Payer: Medicare PPO

## 2013-04-22 DIAGNOSIS — E291 Testicular hypofunction: Secondary | ICD-10-CM

## 2013-05-13 ENCOUNTER — Ambulatory Visit (INDEPENDENT_AMBULATORY_CARE_PROVIDER_SITE_OTHER): Payer: Medicare PPO | Admitting: *Deleted

## 2013-05-13 DIAGNOSIS — Z23 Encounter for immunization: Secondary | ICD-10-CM

## 2013-05-13 NOTE — Patient Instructions (Signed)
Testosterone This test is used to determine if your testosterone level is abnormal. This could be used to explain difficulty getting an erection (erectile dysfunction), inability of your partner to get pregnant (infertility), premature or delayed puberty if you are male, or the appearance of masculine physical features if you are male. PREPARATION FOR TEST A blood sample is obtained by inserting a needle into a vein in the arm. NORMAL FINDINGS  Free Testosterone: 0.3-2 pg/mL  % Free Testosterone: 0.1%-0.3% Total Testosterone:  7 mos-9 yrs (Tanner Stage I)  Male: Less than 30 ng/dL  Male: Less than 30 ng/dL  10-13 yrs (Tanner Stage II)  Male: Less than 300 ng/dL  Male: Less than 40 ng/dL  14-15 yrs (Tanner Stage III)  Male: 170-540 ng/dL  Male: Less than 60 ng/dL  16-19 yrs (Tanner Stage IV, V)  Male: 250-910 ng/dL  Male: Less than 70 ng/dL  20 yrs and over  Male: 280-1080 ng/dL  Male: Less than 70 ng/dL Ranges for normal findings may vary among different laboratories and hospitals. You should always check with your doctor after having lab work or other tests done to discuss the meaning of your test results and whether your values are considered within normal limits. MEANING OF TEST  Your caregiver will go over the test results with you and discuss the importance and meaning of your results, as well as treatment options and the need for additional tests if necessary. OBTAINING THE TEST RESULTS It is your responsibility to obtain your test results. Ask the lab or department performing the test when and how you will get your results. Document Released: 08/23/2004 Document Revised: 10/29/2011 Document Reviewed: 07/18/2008 [employer] Patient Information 2014 [employer].  

## 2013-05-21 ENCOUNTER — Other Ambulatory Visit: Payer: Self-pay | Admitting: *Deleted

## 2013-05-21 DIAGNOSIS — Z01818 Encounter for other preprocedural examination: Secondary | ICD-10-CM

## 2013-06-03 ENCOUNTER — Ambulatory Visit (INDEPENDENT_AMBULATORY_CARE_PROVIDER_SITE_OTHER): Payer: Medicare PPO | Admitting: *Deleted

## 2013-06-03 ENCOUNTER — Encounter (INDEPENDENT_AMBULATORY_CARE_PROVIDER_SITE_OTHER): Payer: Self-pay

## 2013-06-03 DIAGNOSIS — E291 Testicular hypofunction: Secondary | ICD-10-CM

## 2013-06-10 ENCOUNTER — Ambulatory Visit (INDEPENDENT_AMBULATORY_CARE_PROVIDER_SITE_OTHER): Payer: Medicare PPO | Admitting: Cardiology

## 2013-06-10 ENCOUNTER — Encounter: Payer: Self-pay | Admitting: Cardiology

## 2013-06-10 VITALS — BP 122/79 | HR 84 | Ht 71.0 in | Wt 167.1 lb

## 2013-06-10 DIAGNOSIS — R0600 Dyspnea, unspecified: Secondary | ICD-10-CM | POA: Insufficient documentation

## 2013-06-10 DIAGNOSIS — R0609 Other forms of dyspnea: Secondary | ICD-10-CM

## 2013-06-10 DIAGNOSIS — E7849 Other hyperlipidemia: Secondary | ICD-10-CM | POA: Insufficient documentation

## 2013-06-10 DIAGNOSIS — E785 Hyperlipidemia, unspecified: Secondary | ICD-10-CM

## 2013-06-10 DIAGNOSIS — R011 Cardiac murmur, unspecified: Secondary | ICD-10-CM

## 2013-06-10 DIAGNOSIS — Z136 Encounter for screening for cardiovascular disorders: Secondary | ICD-10-CM

## 2013-06-10 NOTE — Progress Notes (Signed)
Clinical Summary Mr. Bagsby is a 79 y.o.male seen today for the first time for preop evaluation prior to right knee replacement surgery.   1. CAD - diagnosis listed in prior notes - patient denies this history. Denies any history of MI, no history of prior caths. Denies any history of chest pain.  - limited physically because of knee pain. Most active thing he does is splitting wood. Cannot walk long distances because of knee pain.  - describes some DOE with activity x 6 months, example walking back and forth to the mailbox. No orthopnea, no PND, no LE edema.   2. HTN - checks at home occasionally but does not recall numbers - compliant with meds   3. HL - compliant with statin. Reports "feeling bad" on lipitor, tolerates pravastatin well.  - followed by PCP 02/2013 panel: TC 123 TG 108 HDL 29 LDL 59   Past Medical History  Diagnosis Date  . Prostate cancer   . GERD (gastroesophageal reflux disease)   . Hypertension   . Hyperlipemia   . Arthritis   . Inguinal hernia     left     Allergies  Allergen Reactions  . Lipitor [Atorvastatin] Other (See Comments)    "feel bad"     Current Outpatient Prescriptions  Medication Sig Dispense Refill  . amLODipine (NORVASC) 5 MG tablet One tablet by mouth once daily      . DEPO-TESTOSTERONE 200 MG/ML injection As directed      . diclofenac (VOLTAREN) 75 MG EC tablet Take 1 tablet (75 mg total) by mouth every other day.  15 tablet  0  . doxycycline (VIBRA-TABS) 100 MG tablet Take 1 tablet (100 mg total) by mouth 2 (two) times daily.  20 tablet  0  . hydrochlorothiazide (HYDRODIURIL) 25 MG tablet Take 25 mg by mouth daily.      Marland Kitchen lisinopril (PRINIVIL,ZESTRIL) 40 MG tablet One tablet by mouth once daily      . metoprolol tartrate (LOPRESSOR) 25 MG tablet One tablet by mouth once daily      . omeprazole (PRILOSEC) 40 MG capsule Takes one capsule by mouth once daily      . oxybutynin (DITROPAN-XL) 10 MG 24 hr tablet Take 1 tablet  (10 mg total) by mouth daily.  30 tablet  11  . pramipexole (MIRAPEX) 1 MG tablet Takes as directed      . pravastatin (PRAVACHOL) 40 MG tablet Takes one tablet by mouth once daily      . zolpidem (AMBIEN) 10 MG tablet Take 10 mg by mouth at bedtime as needed.       Current Facility-Administered Medications  Medication Dose Route Frequency Provider Last Rate Last Dose  . testosterone cypionate (DEPOTESTOTERONE CYPIONATE) injection 300 mg  300 mg Intramuscular Q21 days Ernestina Penna, MD   300 mg at 06/03/13 4098     Past Surgical History  Procedure Laterality Date  . Hernia repair    . Prostate surgery    . Colonoscopy       Allergies  Allergen Reactions  . Lipitor [Atorvastatin] Other (See Comments)    "feel bad"      Family History  Problem Relation Age of Onset  . Colon cancer Neg Hx      Social History Mr. Humphres reports that he quit smoking about 34 years ago. He has never used smokeless tobacco. Mr. Mihalik reports that he does not drink alcohol.   Review of Systems CONSTITUTIONAL: No weight  loss, fever, chills, weakness or fatigue.  HEENT: Eyes: No visual loss, blurred vision, double vision or yellow sclerae.No hearing loss, sneezing, congestion, runny nose or sore throat.  SKIN: No rash or itching.  CARDIOVASCULAR: per HPI RESPIRATORY: No shortness of breath, cough or sputum.  GASTROINTESTINAL: No anorexia, nausea, vomiting or diarrhea. No abdominal pain or blood.  GENITOURINARY: No burning on urination, no polyuria NEUROLOGICAL: No headache, dizziness, syncope, paralysis, ataxia, numbness or tingling in the extremities. No change in bowel or bladder control.  MUSCULOSKELETAL: chronic knee pain.  LYMPHATICS: No enlarged nodes. No history of splenectomy.  PSYCHIATRIC: No history of depression or anxiety.  ENDOCRINOLOGIC: No reports of sweating, cold or heat intolerance. No polyuria or polydipsia.  Marland Kitchen   Physical Examination p 84 bp 122/79 Wt 167 lbs BMI 23 Gen:  resting comfortably, no acute distress HEENT: no scleral icterus, pupils equal round and reactive, no palptable cervical adenopathy,  CV: RRR, 3/6 systolic murmur RUSB mid peaking, no JVD, no carotid bruits Resp: Clear to auscultation bilaterally GI: abdomen is soft, non-tender, non-distended, normal bowel sounds, no hepatosplenomegaly MSK: extremities are warm, no edema.  Skin: warm, no rash Neuro:  no focal deficits Psych: appropriate affect   Diagnostic Studies 06/10/13 Clinic EKG: SR, LAD, LAE, no ischemic changes   Assessment and Plan  1. Heart murmur - findings suggestive of aortic stenosis, given her symptoms of DOE this may be significant - order 2echo to further evalute  2. CAD - listed in prior clinic notes, patient denies any known history - denies any chest pain, EKG does not show evidence of ischemia - he is having some DOE over the last few months, will follow up echo and potential further ischemic workup after  3. HL - at goal, continue current statin  4. Preoperative evaluation - recommend awaiting completion of cardiac workup prior to proceeding to knee replacement surgery       Antoine Poche, M.D., F.A.C.C.

## 2013-06-10 NOTE — Patient Instructions (Signed)
Your physician recommends that you schedule a follow-up appointment in: 3 weeks after echocardiogram.   Your physician has requested that you have an echocardiogram. Echocardiography is a painless test that uses sound waves to create images of your heart. It provides your doctor with information about the size and shape of your heart and how well your heart's chambers and valves are working. This procedure takes approximately one hour. There are no restrictions for this procedure.  These appointments will be scheduled today.  Your physician recommends that you continue on your current medications as directed. Please refer to the Current Medication list given to you today.

## 2013-06-12 ENCOUNTER — Ambulatory Visit: Payer: Medicare PPO | Admitting: Cardiology

## 2013-06-17 ENCOUNTER — Other Ambulatory Visit: Payer: Self-pay

## 2013-06-17 ENCOUNTER — Other Ambulatory Visit (INDEPENDENT_AMBULATORY_CARE_PROVIDER_SITE_OTHER): Payer: Medicare PPO

## 2013-06-17 DIAGNOSIS — R011 Cardiac murmur, unspecified: Secondary | ICD-10-CM

## 2013-06-17 DIAGNOSIS — R0609 Other forms of dyspnea: Secondary | ICD-10-CM

## 2013-06-17 DIAGNOSIS — I059 Rheumatic mitral valve disease, unspecified: Secondary | ICD-10-CM

## 2013-06-17 DIAGNOSIS — I359 Nonrheumatic aortic valve disorder, unspecified: Secondary | ICD-10-CM

## 2013-06-18 ENCOUNTER — Telehealth: Payer: Self-pay | Admitting: Cardiology

## 2013-06-18 NOTE — Telephone Encounter (Signed)
LM for pt to returncall

## 2013-06-18 NOTE — Telephone Encounter (Signed)
Message copied by Burnice Logan on Thu Jun 18, 2013  2:01 PM ------      Message from: [place] F      Created: Thu Jun 18, 2013  9:40 AM       Please let patient know that the ultrasound of his heart shows normal pumping function. His aortic valve is a little bit thickened, and we will watch it over the next several years, currently it is not a problem and may never be ------

## 2013-06-18 NOTE — Telephone Encounter (Signed)
Pt informed of results.

## 2013-06-25 ENCOUNTER — Ambulatory Visit (INDEPENDENT_AMBULATORY_CARE_PROVIDER_SITE_OTHER): Payer: Medicare PPO | Admitting: *Deleted

## 2013-06-25 DIAGNOSIS — E291 Testicular hypofunction: Secondary | ICD-10-CM

## 2013-06-25 NOTE — Patient Instructions (Signed)
Testosterone injection What is this medicine? TESTOSTERONE (tes TOS ter one) is the main male hormone. It supports normal male development such as muscle growth, facial hair, and deep voice. It is used in males to treat low testosterone levels. This medicine may be used for other purposes; ask your health care provider or pharmacist if you have questions. COMMON BRAND NAME(S): Andro-L.A., Aveed, Delatestryl, Depo-Testosterone, Virilon What should I tell my health care provider before I take this medicine? They need to know if you have any of these conditions: -breast cancer -diabetes -heart disease -kidney disease -liver disease -lung disease -prostate cancer, enlargement -an unusual or allergic reaction to testosterone, other medicines, foods, dyes, or preservatives -pregnant or trying to get pregnant -breast-feeding How should I use this medicine? This medicine is for injection into a muscle. It is usually given by a health care professional in a hospital or clinic setting. Contact your pediatrician regarding the use of this medicine in children. While this medicine may be prescribed for children as young as 12 years of age for selected conditions, precautions do apply. Overdosage: If you think you have taken too much of this medicine contact a poison control center or emergency room at once. NOTE: This medicine is only for you. Do not share this medicine with others. What if I miss a dose? Try not to miss a dose. Your doctor or health care professional will tell you when your next injection is due. Notify the office if you are unable to keep an appointment. What may interact with this medicine? -medicines for diabetes -medicines that treat or prevent blood clots like warfarin -oxyphenbutazone -propranolol -steroid medicines like prednisone or cortisone This list may not describe all possible interactions. Give your health care provider a list of all the medicines, herbs,  non-prescription drugs, or dietary supplements you use. Also tell them if you smoke, drink alcohol, or use illegal drugs. Some items may interact with your medicine. What should I watch for while using this medicine? Visit your doctor or health care professional for regular checks on your progress. They will need to check the level of testosterone in your blood. This medicine may affect blood sugar levels. If you have diabetes, check with your doctor or health care professional before you change your diet or the dose of your diabetic medicine. This drug is banned from use in athletes by most athletic organizations. What side effects may I notice from receiving this medicine? Side effects that you should report to your doctor or health care professional as soon as possible: -allergic reactions like skin rash, itching or hives, swelling of the face, lips, or tongue -breast enlargement -breathing problems -changes in mood, especially anger, depression, or rage -dark urine -general ill feeling or flu-like symptoms -light-colored stools -loss of appetite, nausea -nausea, vomiting -right upper belly pain -stomach pain -swelling of ankles -too frequent or persistent erections -trouble passing urine or change in the amount of urine -unusually weak or tired -yellowing of the eyes or skin Additional side effects that can occur in women include: -deep or hoarse voice -facial hair growth -irregular menstrual periods Side effects that usually do not require medical attention (report to your doctor or health care professional if they continue or are bothersome): -acne -change in sex drive or performance -hair loss -headache This list may not describe all possible side effects. Call your doctor for medical advice about side effects. You may report side effects to FDA at 1-800-FDA-1088. Where should I keep my medicine? Keep   out of the reach of children. This medicine can be abused. Keep your  medicine in a safe place to protect it from theft. Do not share this medicine with anyone. Selling or giving away this medicine is dangerous and against the law. Store at room temperature between 20 and 25 degrees C (68 and 77 degrees F). Do not freeze. Protect from light. Follow the directions for the product you are prescribed. Throw away any unused medicine after the expiration date. NOTE: This sheet is a summary. It may not cover all possible information. If you have questions about this medicine, talk to your doctor, pharmacist, or health care provider.  2014, Elsevier/Gold Standard. (2007-10-17 16:13:46)  

## 2013-06-25 NOTE — Progress Notes (Signed)
Patient tolerated well.

## 2013-07-06 ENCOUNTER — Encounter: Payer: Self-pay | Admitting: Cardiology

## 2013-07-06 ENCOUNTER — Ambulatory Visit (INDEPENDENT_AMBULATORY_CARE_PROVIDER_SITE_OTHER): Payer: Medicare PPO | Admitting: Cardiology

## 2013-07-06 VITALS — BP 150/73 | HR 61 | Ht 71.0 in | Wt 171.8 lb

## 2013-07-06 DIAGNOSIS — R0609 Other forms of dyspnea: Secondary | ICD-10-CM

## 2013-07-06 DIAGNOSIS — R0600 Dyspnea, unspecified: Secondary | ICD-10-CM

## 2013-07-06 NOTE — Patient Instructions (Signed)
Your physician recommends that you schedule a follow-up appointment in: 1 year with Dr. Wyline Mood. You should receive a letter in the mail in 10 months. If you do not receive this letter by September 2015 call our office to schedule this appointment.   Your physician recommends that you continue on your current medications as directed. Please refer to the Current Medication list given to you today.  Your physician has requested that you have a lexiscan myoview. For further information please visit [email or web address] Please follow instruction sheet, as given.

## 2013-07-06 NOTE — Progress Notes (Signed)
Clinical Summary [name] is a 79 y.o.male   [name] is a 79 y.o.male seen today for the first time for preop evaluation prior to right knee replacement surgery.   1. Dyspnea.  - limited physically because of knee pain. Most active thing he does is splitting wood. Cannot walk long distances because of knee pain.  - describes some DOE with activity x 6 months, example walking back and forth to the mailbox. No orthopnea, no PND, no LE edema.  - he is being considered for a possible knee replacement surgery and is seeking cardiac clearance.   Past Medical History  Diagnosis Date  . Prostate cancer   . GERD (gastroesophageal reflux disease)   . Hypertension   . Hyperlipemia   . Arthritis   . Inguinal hernia     left     Allergies  Allergen Reactions  . Lipitor [Atorvastatin] Other (See Comments)    "feel bad"     Current Outpatient Prescriptions  Medication Sig Dispense Refill  . amLODipine (NORVASC) 5 MG tablet One tablet by mouth once daily      . hydrochlorothiazide (HYDRODIURIL) 25 MG tablet Take 25 mg by mouth daily.      Marland Kitchen lisinopril (PRINIVIL,ZESTRIL) 40 MG tablet One tablet by mouth once daily      . metoprolol tartrate (LOPRESSOR) 25 MG tablet One tablet by mouth once daily      . omeprazole (PRILOSEC) 40 MG capsule Takes one capsule by mouth once daily      . pramipexole (MIRAPEX) 1 MG tablet Takes as directed      . pravastatin (PRAVACHOL) 40 MG tablet Takes one tablet by mouth once daily      . zolpidem (AMBIEN) 10 MG tablet Take 10 mg by mouth at bedtime as needed.       Current Facility-Administered Medications  Medication Dose Route Frequency Provider Last Rate Last Dose  . testosterone cypionate (DEPOTESTOTERONE CYPIONATE) injection 300 mg  300 mg Intramuscular Q21 days Ernestina Penna, MD   300 mg at 06/25/13 1040     Past Surgical History  Procedure Laterality Date  . Hernia repair    . Prostate surgery    . Colonoscopy       Allergies    Allergen Reactions  . Lipitor [Atorvastatin] Other (See Comments)    "feel bad"      Family History  Problem Relation Age of Onset  . Colon cancer Neg Hx      Social History Mr. Selmon reports that he quit smoking about 34 years ago. He has never used smokeless tobacco. Mr. Lamarque reports that he does not drink alcohol.   Review of Systems CONSTITUTIONAL: No weight loss, fever, chills, weakness or fatigue.  HEENT: Eyes: No visual loss, blurred vision, double vision or yellow sclerae.No hearing loss, sneezing, congestion, runny nose or sore throat.  SKIN: No rash or itching.  CARDIOVASCULAR: per HPI RESPIRATORY: per HPI GASTROINTESTINAL: No anorexia, nausea, vomiting or diarrhea. No abdominal pain or blood.  GENITOURINARY: No burning on urination, no polyuria NEUROLOGICAL: No headache, dizziness, syncope, paralysis, ataxia, numbness or tingling in the extremities. No change in bowel or bladder control.  MUSCULOSKELETAL: chronic knee pain LYMPHATICS: No enlarged nodes. No history of splenectomy.  PSYCHIATRIC: No history of depression or anxiety.  ENDOCRINOLOGIC: No reports of sweating, cold or heat intolerance. No polyuria or polydipsia.  Marland Kitchen   Physical Examination p 61 bp 138/70 Wt 171 lbs BMI 24 Gen: resting  comfortably, no acute distress HEENT: no scleral icterus, pupils equal round and reactive, no palptable cervical adenopathy,  CV: RRR, 2/6 systolic murmur RUSB, no JVD, no carotid bruits Resp: Clear to auscultation bilaterally GI: abdomen is soft, non-tender, non-distended, normal bowel sounds, no hepatosplenomegaly MSK: extremities are warm, no edema.  Skin: warm, no rash Neuro:  no focal deficits Psych: appropriate affect   Diagnostic Studies 06/17/13 Echo: LVEF 55-60%, indeterminate diastolic function with increased LA pressures, mild aortic stenosis (AVA 1.77 by VTI), mild MR, mild RAE    Assessment and Plan  1. Dyspnea - mild AS murmur on exam with mild  stenosis by echo, does not appear to be the etiology - will obtain a lexiscan to evalute for possibel coronary ischemia as the cause of his symptoms. Patient cannot exercise due to severe chronic knee pain.   2. Preoperative evaluation for knee replacement - patient is being considered for an elective intermediate risk surgery. He has no active acute cardiac conditions. I am unable to assess his cardiac functional capacity by history because of limitations due severe knee pain. He has had a several month history of DOE. To further risk stratify him for this surgery and in the setting of DOE will obtain a lexiscan.   3. Aortic stenosis - mild by echo, continue to follow clinically.      Antoine Poche, M.D., F.A.C.C.

## 2013-07-10 ENCOUNTER — Encounter (HOSPITAL_COMMUNITY): Payer: Self-pay

## 2013-07-10 ENCOUNTER — Encounter (HOSPITAL_COMMUNITY)
Admission: RE | Admit: 2013-07-10 | Discharge: 2013-07-10 | Disposition: A | Discharge status: Home / Self Care | Payer: Medicare PPO | Source: Ambulatory Visit | Attending: Cardiology | Admitting: Cardiology

## 2013-07-10 DIAGNOSIS — R0989 Other specified symptoms and signs involving the circulatory and respiratory systems: Secondary | ICD-10-CM | POA: Insufficient documentation

## 2013-07-10 DIAGNOSIS — R0600 Dyspnea, unspecified: Secondary | ICD-10-CM

## 2013-07-10 DIAGNOSIS — R0609 Other forms of dyspnea: Secondary | ICD-10-CM | POA: Insufficient documentation

## 2013-07-10 DIAGNOSIS — Z01810 Encounter for preprocedural cardiovascular examination: Secondary | ICD-10-CM

## 2013-07-10 MED ORDER — SODIUM CHLORIDE 0.9 % IJ SOLN
INTRAMUSCULAR | Status: AC
  Administered 2013-07-10: 10 mL via INTRAVENOUS
  Filled 2013-07-10: qty 10

## 2013-07-10 MED ORDER — TECHNETIUM TC 99M SESTAMIBI - CARDIOLITE
30.0000 | Freq: Once | INTRAVENOUS | Status: AC | PRN
  Administered 2013-07-10: 09:00:00 30 via INTRAVENOUS

## 2013-07-10 MED ORDER — REGADENOSON 0.4 MG/5ML IV SOLN
INTRAVENOUS | Status: AC
  Administered 2013-07-10: 0.4 mg via INTRAVENOUS
  Filled 2013-07-10: qty 5

## 2013-07-10 MED ORDER — TECHNETIUM TC 99M SESTAMIBI - CARDIOLITE
10.0000 | Freq: Once | INTRAVENOUS | Status: AC | PRN
  Administered 2013-07-10: 08:00:00 10 via INTRAVENOUS

## 2013-07-10 NOTE — Progress Notes (Signed)
Stress Lab Nurses Notes - [name]  [name] 07/10/2013 Reason for doing test: Dyspnea and Surgical Clearance Type of test: [name] Nurse performing test: Parke Poisson, RN Nuclear Medicine Tech: Lyndel Pleasure Echo Tech: Not Applicable MD performing test: Ival Bible / K. Lawrence NP Family MD: Christell Constant Test explained and consent signed: yes IV started: 22g jelco, Saline lock flushed, No redness or edema and Saline lock started in radiology Symptoms: SOB Treatment/Intervention: None Reason test stopped: protocol completed After recovery IV was: Discontinued via X-ray tech and No redness or edema Patient to return to Nuc. Med at : 9:30 Patient discharged: Home Patient's Condition upon discharge was: stable Comments: During test BP 129/77 & HR 81.  Recovery BP 140/77 & HR 73.  Symptoms resolved in recovery. [name] T

## 2013-07-13 ENCOUNTER — Telehealth: Payer: Self-pay | Admitting: Cardiology

## 2013-07-13 NOTE — Telephone Encounter (Signed)
Pt informed and follow up appointment scheduled for 07-27-2013 in [place] office at 2:20 PM. Pt aware and verbalized understanding.

## 2013-07-13 NOTE — Telephone Encounter (Signed)
Message copied by Burnice Logan on Mon Jul 13, 2013  9:59 AM ------      Message from: [place] F      Created: Mon Jul 13, 2013  9:27 AM       The patient's stress test had some technical limitations to it, but does suggest some possible blockages. I'd like to see the patient in 1-2 weeks to discuss the results in details and the need for possible further testing. Can we add him on either to my [place] or [place] schedule sometime next week or the week after, he can be added on during my hospital week as well at [facility].                  Dina Rich MD ------

## 2013-07-24 ENCOUNTER — Other Ambulatory Visit (HOSPITAL_COMMUNITY): Payer: Medicare PPO

## 2013-07-27 ENCOUNTER — Encounter: Payer: Self-pay | Admitting: Family Medicine

## 2013-07-27 ENCOUNTER — Ambulatory Visit (INDEPENDENT_AMBULATORY_CARE_PROVIDER_SITE_OTHER): Payer: Medicare PPO

## 2013-07-27 ENCOUNTER — Encounter: Payer: Self-pay | Admitting: Cardiology

## 2013-07-27 ENCOUNTER — Ambulatory Visit (INDEPENDENT_AMBULATORY_CARE_PROVIDER_SITE_OTHER): Payer: Medicare PPO | Admitting: Cardiology

## 2013-07-27 ENCOUNTER — Telehealth: Payer: Self-pay | Admitting: *Deleted

## 2013-07-27 ENCOUNTER — Ambulatory Visit (INDEPENDENT_AMBULATORY_CARE_PROVIDER_SITE_OTHER): Payer: Medicare PPO | Admitting: Family Medicine

## 2013-07-27 VITALS — BP 130/64 | HR 61 | Temp 97.2°F | Ht 71.0 in | Wt 164.0 lb

## 2013-07-27 VITALS — BP 146/73 | HR 74 | Ht 71.0 in | Wt 166.0 lb

## 2013-07-27 DIAGNOSIS — R079 Chest pain, unspecified: Secondary | ICD-10-CM

## 2013-07-27 DIAGNOSIS — R011 Cardiac murmur, unspecified: Secondary | ICD-10-CM

## 2013-07-27 DIAGNOSIS — M1711 Unilateral primary osteoarthritis, right knee: Secondary | ICD-10-CM | POA: Insufficient documentation

## 2013-07-27 DIAGNOSIS — Z01810 Encounter for preprocedural cardiovascular examination: Secondary | ICD-10-CM

## 2013-07-27 DIAGNOSIS — E785 Hyperlipidemia, unspecified: Secondary | ICD-10-CM

## 2013-07-27 DIAGNOSIS — R0609 Other forms of dyspnea: Secondary | ICD-10-CM

## 2013-07-27 DIAGNOSIS — R0781 Pleurodynia: Secondary | ICD-10-CM

## 2013-07-27 DIAGNOSIS — C61 Malignant neoplasm of prostate: Secondary | ICD-10-CM

## 2013-07-27 DIAGNOSIS — E559 Vitamin D deficiency, unspecified: Secondary | ICD-10-CM

## 2013-07-27 DIAGNOSIS — Z23 Encounter for immunization: Secondary | ICD-10-CM

## 2013-07-27 DIAGNOSIS — R0600 Dyspnea, unspecified: Secondary | ICD-10-CM

## 2013-07-27 DIAGNOSIS — E291 Testicular hypofunction: Secondary | ICD-10-CM

## 2013-07-27 DIAGNOSIS — G4700 Insomnia, unspecified: Secondary | ICD-10-CM | POA: Insufficient documentation

## 2013-07-27 DIAGNOSIS — E349 Endocrine disorder, unspecified: Secondary | ICD-10-CM | POA: Insufficient documentation

## 2013-07-27 DIAGNOSIS — M1710 Unilateral primary osteoarthritis, unspecified knee: Secondary | ICD-10-CM

## 2013-07-27 MED ORDER — TESTOSTERONE CYPIONATE 200 MG/ML IM SOLN
300.0000 mg | INTRAMUSCULAR | Status: DC
  Administered 2013-07-27 – 2013-08-26 (×2): 300 mg via INTRAMUSCULAR

## 2013-07-27 MED ORDER — PRAMIPEXOLE DIHYDROCHLORIDE 1 MG PO TABS: 1.0000 mg | ORAL_TABLET | Freq: Every day | ORAL | Status: DC

## 2013-07-27 MED ORDER — AMLODIPINE BESYLATE 5 MG PO TABS: 5.0000 mg | ORAL_TABLET | Freq: Every day | ORAL | Status: DC

## 2013-07-27 MED ORDER — PRAVASTATIN SODIUM 40 MG PO TABS: 40.0000 mg | ORAL_TABLET | Freq: Every day | ORAL | Status: DC

## 2013-07-27 MED ORDER — TESTOSTERONE CYPIONATE 200 MG/ML IM SOLN: 300.0000 mg | INTRAMUSCULAR | Status: DC

## 2013-07-27 MED ORDER — HYDROCHLOROTHIAZIDE 25 MG PO TABS: 25.0000 mg | ORAL_TABLET | Freq: Every day | ORAL | Status: DC

## 2013-07-27 MED ORDER — LISINOPRIL 40 MG PO TABS: 40.0000 mg | ORAL_TABLET | Freq: Every day | ORAL | Status: DC

## 2013-07-27 MED ORDER — METOPROLOL TARTRATE 25 MG PO TABS: 25.0000 mg | ORAL_TABLET | Freq: Every day | ORAL | Status: DC

## 2013-07-27 MED ORDER — OMEPRAZOLE 40 MG PO CPDR: 40.0000 mg | DELAYED_RELEASE_CAPSULE | Freq: Every day | ORAL | Status: DC

## 2013-07-27 MED ORDER — METOPROLOL TARTRATE 25 MG PO TABS: 25.0000 mg | ORAL_TABLET | Freq: Two times a day (BID) | ORAL | Status: DC

## 2013-07-27 NOTE — Patient Instructions (Addendum)
Continue current medications. Continue good therapeutic lifestyle changes which include good diet and exercise. Fall precautions discussed with patient. Schedule your flu vaccine if you haven't had it yet If you are over 77 years old - you may need Prevnar 13 or the adult Pneumonia vaccine. Follow through with your visit to the cardiologist This winter, use a cool mist humidifier in your home and keep the house cooler

## 2013-07-27 NOTE — Progress Notes (Signed)
   Clinical Summary Mr. Trompeter is a 79 y.o.male seen today for follow up.   1. Dyspnea.  - limited physically because of knee pain. Most active thing he does is splitting wood. Cannot walk long distances because of knee pain.  - describes some mild DOE with activity x 6 months, example walking back and forth to the mailbox notes some dyspnea. No orthopnea, no PND, no LE edema.  - he is being considered for a possible knee replacement surgery and is seeking cardiac clearance. - since last visit notes no change in his symptoms. He has completed his stress MPI which showed evidence of ischemia, but was judged as a low risk study - he specifically denies any chest pain, only notes some mild increase in DOE in activities he used to tolerate well.    Past Medical History  Diagnosis Date  . Prostate cancer   . GERD (gastroesophageal reflux disease)   . Hypertension   . Hyperlipemia   . Arthritis   . Inguinal hernia     left     Allergies  Allergen Reactions  . Lipitor [Atorvastatin] Other (See Comments)    "feel bad"     Current Outpatient Prescriptions  Medication Sig Dispense Refill  . amLODipine (NORVASC) 5 MG tablet Take 1 tablet (5 mg total) by mouth daily. One tablet by mouth once daily  90 tablet  3  . hydrochlorothiazide (HYDRODIURIL) 25 MG tablet Take 1 tablet (25 mg total) by mouth daily.  90 tablet  3  . lisinopril (PRINIVIL,ZESTRIL) 40 MG tablet Take 1 tablet (40 mg total) by mouth daily. One tablet by mouth once daily  90 tablet  3  . metoprolol tartrate (LOPRESSOR) 25 MG tablet Take 1 tablet (25 mg total) by mouth 2 (two) times daily. One tablet by mouth once daily  90 tablet  3  . omeprazole (PRILOSEC) 40 MG capsule Take 1 capsule (40 mg total) by mouth daily. Takes one capsule by mouth once daily  90 capsule  3  . oxybutynin (DITROPAN-XL) 10 MG 24 hr tablet Take 1 tablet by mouth daily.      . pramipexole (MIRAPEX) 1 MG tablet Take 1 tablet (1 mg total) by mouth  daily. Takes as directed  90 tablet  3  . pravastatin (PRAVACHOL) 40 MG tablet Take 1 tablet (40 mg total) by mouth daily. Takes one tablet by mouth once daily  90 tablet  3  . testosterone cypionate (DEPOTESTOTERONE CYPIONATE) 200 MG/ML injection Inject 1.5 mLs (300 mg total) into the muscle every 28 (twenty-eight) days.  10 mL  1  . zolpidem (AMBIEN) 10 MG tablet Take 10 mg by mouth at bedtime as needed.       Current Facility-Administered Medications  Medication Dose Route Frequency Provider Last Rate Last Dose  . testosterone cypionate (DEPOTESTOTERONE CYPIONATE) injection 300 mg  300 mg Intramuscular Q28 days Donald W Moore, MD   300 mg at 07/27/13 1150     Past Surgical History  Procedure Laterality Date  . Hernia repair    . Prostate surgery    . Colonoscopy       Allergies  Allergen Reactions  . Lipitor [Atorvastatin] Other (See Comments)    "feel bad"      Family History  Problem Relation Age of Onset  . Colon cancer Neg Hx      Social History Mr. Rostad reports that he quit smoking about 34 years ago. He has never used smokeless tobacco. Mr.   Heathman reports that he does not drink alcohol.   Review of Systems CONSTITUTIONAL: No weight loss, fever, chills, weakness or fatigue.  HEENT: Eyes: No visual loss, blurred vision, double vision or yellow sclerae.No hearing loss, sneezing, congestion, runny nose or sore throat.  SKIN: No rash or itching.  CARDIOVASCULAR: per HPI RESPIRATORY: per HPI  GASTROINTESTINAL: No anorexia, nausea, vomiting or diarrhea. No abdominal pain or blood.  GENITOURINARY: No burning on urination, no polyuria NEUROLOGICAL: No headache, dizziness, syncope, paralysis, ataxia, numbness or tingling in the extremities. No change in bowel or bladder control.  MUSCULOSKELETAL: No muscle, back pain, joint pain or stiffness.  LYMPHATICS: No enlarged nodes. No history of splenectomy.  PSYCHIATRIC: No history of depression or anxiety.  ENDOCRINOLOGIC: No  reports of sweating, cold or heat intolerance. No polyuria or polydipsia.  .   Physical Examination p 74 bp 146/73 Wt 166 lbs BMI 23 Gen: resting comfortably, no acute distress HEENT: no scleral icterus, pupils equal round and reactive, no palptable cervical adenopathy,  CV: RRR, no m/r/g, no JVD Resp: Clear to auscultation bilaterally GI: abdomen is soft, non-tender, non-distended, normal bowel sounds, no hepatosplenomegaly MSK: extremities are warm, no edema.  Skin: warm, no rash Neuro:  no focal deficits Psych: appropriate affect   Diagnostic Studies 06/17/13 Echo: LVEF 55-60%, indeterminate diastolic function with increased LA pressures, mild aortic stenosis (AVA 1.77 by VTI), mild MR, mild RAE  06/17/13 Echo LVEF 55-60%, indet diastolic dysfunction with evidence of increased LA pressure, aortic sclerosis without stenosis, mild MR, severe LAE  07/13/13 Lexiscan MPI Small to moderate sized apical defect partially reversible, apical anteroseptal Odor partially reversible. LVEF 71%. Technically difficult study.    Assessment and Plan  1. Dyspnea/Preoperative Evaluation - patient referred for moderate risk surgery, knee replacement. I was unable to assess his functional capacity by history because he is limited by severe knee pain. He described some mild dyspnea on exertion that developed approx 6 months ago. Of note he lives a sedentary lifestyle and I suspect he would be even more symptomatic if more active. In this setting, I obtained an echo which showed normal [place] motion and mild AS. To further risk stratify him for this elective surgery I obtained a lexiscan MPI which shows 2 mild territories of ischemia.  - he likely has mild CAD based on all these findings that can be managed medically, however given plans for an upcoming elective surgery I feel its important to clarify his coronary anatomy to rule out any high risk lesions. - will arrange cath, if significant high risk  disease consider bare metal stent to lower waiting time for knee surgery. If low risk anatomy, likely can manage medically including beta blocker which he is already on.           Elias Bordner F. Henryk Ursin, M.D., F.A.C.C.  

## 2013-07-27 NOTE — Progress Notes (Signed)
Subjective:    Patient ID: [name], male    DOB: [birth year], 79 y.o.   MRN: [id number]  HPI Pt here for follow up and management of chronic medical problems. Patient was scheduled to have a knee replacement because of degenerative arthritis. This surgery has been postponed until further evaluation by the cardiologist is done. He will receive a Prevnar vaccine today. The urologist follows for the prostate cancer. All other health maintenance parameters are up-to-date. The patient's weight is stable. It was 163 in July at an office visit here. It is 164 today. The patient has a planned visit again today with the cardiologist.          Patient Active Problem List   Diagnosis Date Noted  . Hyperlipidemia 06/10/2013  . Heart murmur 06/10/2013  . Dyspnea 06/10/2013  . Inguinal hernia, left. 04/04/2012  . Paraesophageal hiatal hernia 03/24/2012  . Weight loss, abnormal 01/15/2012  . Prostate cancer 01/15/2012  . Special screening for malignant neoplasms, colon 01/15/2012   Outpatient Encounter Prescriptions as of 07/27/2013  Medication Sig  . amLODipine (NORVASC) 5 MG tablet One tablet by mouth once daily  . hydrochlorothiazide (HYDRODIURIL) 25 MG tablet Take 25 mg by mouth daily.  Marland Kitchen lisinopril (PRINIVIL,ZESTRIL) 40 MG tablet One tablet by mouth once daily  . metoprolol tartrate (LOPRESSOR) 25 MG tablet One tablet by mouth once daily  . omeprazole (PRILOSEC) 40 MG capsule Takes one capsule by mouth once daily  . oxybutynin (DITROPAN-XL) 10 MG 24 hr tablet Take 1 tablet by mouth daily.  . pramipexole (MIRAPEX) 1 MG tablet Takes as directed  . pravastatin (PRAVACHOL) 40 MG tablet Takes one tablet by mouth once daily  . testosterone cypionate (DEPOTESTOTERONE CYPIONATE) 200 MG/ML injection Inject 300 mg into the muscle every 28 (twenty-eight) days.  Marland Kitchen zolpidem (AMBIEN) 10 MG tablet Take 10 mg by mouth at bedtime as needed.       Review of Systems  Constitutional: Negative.     HENT: Negative.   Eyes: Negative.   Respiratory: Negative.   Cardiovascular: Negative.   Gastrointestinal: Negative.   Endocrine: Negative.   Genitourinary: Negative.   Musculoskeletal: Positive for arthralgias (knee pain- surgury has been postponed).       Left side rib pain- had xray in past  Skin: Negative.   Allergic/Immunologic: Negative.   Neurological: Negative.   Hematological: Negative.   Psychiatric/Behavioral: Negative.        Objective:   Physical Exam  Nursing note and vitals reviewed. Constitutional: He is oriented to person, place, and time. He appears well-developed and well-nourished. No distress.  HENT:  Head: Normocephalic and atraumatic.  Right Ear: External ear normal.  Left Ear: External ear normal.  Mouth/Throat: Oropharynx is clear and moist. No oropharyngeal exudate.  Some nasal congestion bilaterally  Eyes: Conjunctivae and EOM are normal. Pupils are equal, round, and reactive to light. Right eye exhibits no discharge. Left eye exhibits no discharge. No scleral icterus.  Neck: Normal range of motion. Neck supple. No tracheal deviation present. No thyromegaly present.  Cardiovascular: Normal rate, regular rhythm, normal heart sounds and intact distal pulses.  Exam reveals no gallop and no friction rub.   No murmur heard. At 60 per minute  Pulmonary/Chest: Effort normal. No respiratory distress. He has no wheezes. He has no rales. He exhibits no tenderness.  There is a popping cracking-like sound in the left lung base posteriorly  Abdominal: Soft. Bowel sounds are normal. He exhibits no mass. There is  no tenderness. There is no rebound and no guarding.  Patient has a midline incision from his prostatectomy and a left lower quadrant incision from a hernia repair  Musculoskeletal: Normal range of motion. He exhibits no edema and no tenderness.  Some tenderness with flexion and extension of the right knee medially  Lymphadenopathy:    He has no cervical  adenopathy.  Neurological: He is alert and oriented to person, place, and time. He has normal reflexes. No cranial nerve deficit.  Skin: Skin is warm and dry. No rash noted. No erythema. No pallor.  Psychiatric: He has a normal mood and affect. His behavior is normal. Judgment and thought content normal.   BP 130/64  Pulse 61  Temp(Src) 97.2 F (36.2 C) (Oral)  Ht 5\' 11"  (1.803 m)  Wt 164 lb (74.39 kg)  BMI 22.88 kg/m2  [facility] reading (PRIMARY) by  Dr.Moore; chest x-ray with rib detail--no acute disease changes. Patient does have a large hiatal hernia .                                     Assessment & Plan:  1. Heart murmur - POCT CBC; Future  2. Prostate cancer - POCT CBC; Future - testosterone cypionate (DEPOTESTOTERONE CYPIONATE) injection 300 mg; Inject 1.5 mLs (300 mg total) into the muscle every 28 (twenty-eight) days.  3. Hyperlipidemia - BMP8+EGFR; Future - Hepatic function panel; Future - NMR, lipoprofile; Future  4. Insomnia  5. Testosterone deficiency - testosterone cypionate (DEPOTESTOTERONE CYPIONATE) injection 300 mg; Inject 1.5 mLs (300 mg total) into the muscle every 28 (twenty-eight) days.  6. Vitamin D deficiency - Vit D  25 hydroxy (rtn osteoporosis monitoring); Future  7. Need for prophylactic vaccination against Streptococcus pneumoniae (pneumococcus) - Pneumococcal conjugate vaccine 13-valent  8. Rib pain on left side - DG Chest 2 View; Future  9. Osteoarthritis of right knee  Meds ordered this encounter  Medications  . DISCONTD: testosterone cypionate (DEPOTESTOTERONE CYPIONATE) 200 MG/ML injection    Sig: Inject 300 mg into the muscle every 28 (twenty-eight) days.  Marland Kitchen oxybutynin (DITROPAN-XL) 10 MG 24 hr tablet    Sig: Take 1 tablet by mouth daily.  Marland Kitchen amLODipine (NORVASC) 5 MG tablet    Sig: Take 1 tablet (5 mg total) by mouth daily. One tablet by mouth once daily    Dispense:  90 tablet    Refill:  3  . hydrochlorothiazide (HYDRODIURIL)  25 MG tablet    Sig: Take 1 tablet (25 mg total) by mouth daily.    Dispense:  90 tablet    Refill:  3  . lisinopril (PRINIVIL,ZESTRIL) 40 MG tablet    Sig: Take 1 tablet (40 mg total) by mouth daily. One tablet by mouth once daily    Dispense:  90 tablet    Refill:  3  . metoprolol tartrate (LOPRESSOR) 25 MG tablet    Sig: Take 1 tablet (25 mg total) by mouth 2 (two) times daily. One tablet by mouth once daily    Dispense:  90 tablet    Refill:  3  . omeprazole (PRILOSEC) 40 MG capsule    Sig: Take 1 capsule (40 mg total) by mouth daily. Takes one capsule by mouth once daily    Dispense:  90 capsule    Refill:  3  . pramipexole (MIRAPEX) 1 MG tablet    Sig: Take 1 tablet (1 mg total) by  mouth daily. Takes as directed    Dispense:  90 tablet    Refill:  3  . pravastatin (PRAVACHOL) 40 MG tablet    Sig: Take 1 tablet (40 mg total) by mouth daily. Takes one tablet by mouth once daily    Dispense:  90 tablet    Refill:  3  . testosterone cypionate (DEPOTESTOTERONE CYPIONATE) 200 MG/ML injection    Sig: Inject 1.5 mLs (300 mg total) into the muscle every 28 (twenty-eight) days.    Dispense:  10 mL    Refill:  1  . testosterone cypionate (DEPOTESTOTERONE CYPIONATE) injection 300 mg    Sig:    Patient Instructions  Continue current medications. Continue good therapeutic lifestyle changes which include good diet and exercise. Fall precautions discussed with patient. Schedule your flu vaccine if you haven't had it yet If you are over 54 years old - you may need Prevnar 13 or the adult Pneumonia vaccine. Follow through with your visit to the cardiologist This winter, use a cool mist humidifier in your home and keep the house cooler    Nyra Capes MD

## 2013-07-27 NOTE — Telephone Encounter (Signed)
Message copied by Dannielle Huh on Mon Jul 27, 2013  2:34 PM ------      Message from: Ernestina Penna      Created: Mon Jul 27, 2013  1:43 PM       As per his report ------

## 2013-07-27 NOTE — Patient Instructions (Signed)
Your physician recommends that you continue on your current medications as directed. Please refer to the Current Medication list given to you today.  Your physician has requested that you have a cardiac catheterization. Cardiac catheterization is used to diagnose and/or treat various heart conditions. Doctors may recommend this procedure for a number of different reasons. The most common reason is to evaluate chest pain. Chest pain can be a symptom of coronary artery disease (CAD), and cardiac catheterization can show whether plaque is narrowing or blocking your heart's arteries. This procedure is also used to evaluate the valves, as well as measure the blood flow and oxygen levels in different parts of your heart. For further information please visit [email or web address] Please follow instruction sheet, as given.  You will be given a follow up appointment when discharged from hospital.

## 2013-07-27 NOTE — Telephone Encounter (Signed)
Message copied by Dannielle Huh on Mon Jul 27, 2013  4:25 PM ------      Message from: [name]      Created: Mon Jul 27, 2013  1:43 PM       As per his report ------

## 2013-07-28 ENCOUNTER — Other Ambulatory Visit (INDEPENDENT_AMBULATORY_CARE_PROVIDER_SITE_OTHER): Payer: Medicare PPO

## 2013-07-28 ENCOUNTER — Encounter (HOSPITAL_COMMUNITY): Payer: Self-pay | Admitting: Respiratory Therapy

## 2013-07-28 ENCOUNTER — Telehealth: Payer: Self-pay | Admitting: *Deleted

## 2013-07-28 DIAGNOSIS — R011 Cardiac murmur, unspecified: Secondary | ICD-10-CM

## 2013-07-28 DIAGNOSIS — C61 Malignant neoplasm of prostate: Secondary | ICD-10-CM

## 2013-07-28 DIAGNOSIS — E559 Vitamin D deficiency, unspecified: Secondary | ICD-10-CM

## 2013-07-28 DIAGNOSIS — E785 Hyperlipidemia, unspecified: Secondary | ICD-10-CM

## 2013-07-28 LAB — POCT CBC
HCT, POC: 51.5 % (ref 43.5–53.7)
Hemoglobin: 16.8 g/dL (ref 14.1–18.1)
Lymph, poc: 1 (ref 0.6–3.4)
MCH, POC: 32.1 pg — AB (ref 27–31.2)
MCHC: 32.7 g/dL (ref 31.8–35.4)
MCV: 95.4 fL (ref 80–97)
MPV: 8.6 fL (ref 0–99.8)
POC LYMPH PERCENT: 11.9 %L (ref 10–50)
RBC: 5.4 M/uL (ref 4.69–6.13)
RDW, POC: 13 %

## 2013-07-28 MED ORDER — AZITHROMYCIN 250 MG PO TABS: ORAL_TABLET | ORAL | Status: DC

## 2013-07-28 NOTE — Progress Notes (Signed)
Patient came in for labs only.

## 2013-07-28 NOTE — Telephone Encounter (Signed)
Patient wants to know if we can call in a Zpack.

## 2013-07-30 LAB — NMR, LIPOPROFILE
HDL Particle Number: 28.5 umol/L — ABNORMAL LOW (ref >=30.5)
LDL Particle Number: 1103 nmol/L — ABNORMAL HIGH (ref <1000)
LDL Size: 20.2 nm — ABNORMAL LOW (ref >20.5)
LDLC SERPL CALC-MCNC: 57 mg/dL (ref <100)
LP-IR Score: 54 — ABNORMAL HIGH (ref <=45)

## 2013-07-30 LAB — BMP8+EGFR
BUN/Creatinine Ratio: 17 (ref 10–22)
Chloride: 98 mmol/L (ref 97–108)
Creatinine, Ser: 1.33 mg/dL — ABNORMAL HIGH (ref 0.76–1.27)
GFR calc Af Amer: 56 mL/min/{1.73_m2} — ABNORMAL LOW (ref >59)
Potassium: 4.5 mmol/L (ref 3.5–5.2)

## 2013-07-30 LAB — HEPATIC FUNCTION PANEL
AST: 18 IU/L (ref 0–40)
Alkaline Phosphatase: 78 IU/L (ref 39–117)

## 2013-07-30 LAB — VITAMIN D 25 HYDROXY (VIT D DEFICIENCY, FRACTURES): Vit D, 25-Hydroxy: 35.6 ng/mL (ref 30.0–100.0)

## 2013-08-03 ENCOUNTER — Ambulatory Visit (HOSPITAL_COMMUNITY)
Admission: RE | Admit: 2013-08-03 | Discharge: 2013-08-03 | Disposition: A | Discharge status: Home / Self Care | Payer: Medicare PPO | Source: Ambulatory Visit | Attending: Cardiovascular Disease | Admitting: Cardiovascular Disease

## 2013-08-03 ENCOUNTER — Encounter (HOSPITAL_COMMUNITY): Admission: RE | Payer: Self-pay | Source: Ambulatory Visit

## 2013-08-03 ENCOUNTER — Inpatient Hospital Stay (HOSPITAL_COMMUNITY): Admission: RE | Admit: 2013-08-03 | Payer: Medicare PPO | Source: Ambulatory Visit | Admitting: Orthopedic Surgery

## 2013-08-03 ENCOUNTER — Encounter (HOSPITAL_COMMUNITY)
Admission: RE | Disposition: A | Discharge status: Home / Self Care | Payer: Self-pay | Source: Ambulatory Visit | Attending: Cardiovascular Disease

## 2013-08-03 DIAGNOSIS — E785 Hyperlipidemia, unspecified: Secondary | ICD-10-CM | POA: Insufficient documentation

## 2013-08-03 DIAGNOSIS — R0989 Other specified symptoms and signs involving the circulatory and respiratory systems: Secondary | ICD-10-CM | POA: Insufficient documentation

## 2013-08-03 DIAGNOSIS — K4090 Unilateral inguinal hernia, without obstruction or gangrene, not specified as recurrent: Secondary | ICD-10-CM | POA: Insufficient documentation

## 2013-08-03 DIAGNOSIS — Z79899 Other long term (current) drug therapy: Secondary | ICD-10-CM | POA: Insufficient documentation

## 2013-08-03 DIAGNOSIS — C61 Malignant neoplasm of prostate: Secondary | ICD-10-CM | POA: Insufficient documentation

## 2013-08-03 DIAGNOSIS — I10 Essential (primary) hypertension: Secondary | ICD-10-CM | POA: Insufficient documentation

## 2013-08-03 DIAGNOSIS — R0609 Other forms of dyspnea: Secondary | ICD-10-CM | POA: Insufficient documentation

## 2013-08-03 DIAGNOSIS — K219 Gastro-esophageal reflux disease without esophagitis: Secondary | ICD-10-CM | POA: Insufficient documentation

## 2013-08-03 DIAGNOSIS — Z87891 Personal history of nicotine dependence: Secondary | ICD-10-CM | POA: Insufficient documentation

## 2013-08-03 DIAGNOSIS — M25569 Pain in unspecified knee: Secondary | ICD-10-CM | POA: Insufficient documentation

## 2013-08-03 DIAGNOSIS — I2510 Atherosclerotic heart disease of native coronary artery without angina pectoris: Secondary | ICD-10-CM

## 2013-08-03 HISTORY — PX: LEFT HEART CATHETERIZATION WITH CORONARY ANGIOGRAM: SHX5451

## 2013-08-03 LAB — PROTIME-INR
INR: 0.99 (ref 0.00–1.49)
Prothrombin Time: 12.9 seconds (ref 11.6–15.2)

## 2013-08-03 SURGERY — ARTHROPLASTY, KNEE, TOTAL
Anesthesia: General | Laterality: Right

## 2013-08-03 SURGERY — LEFT HEART CATHETERIZATION WITH CORONARY ANGIOGRAM
Anesthesia: LOCAL

## 2013-08-03 MED ORDER — NITROGLYCERIN 0.2 MG/ML ON CALL CATH LAB
INTRAVENOUS | Status: AC
  Filled 2013-08-03: qty 1

## 2013-08-03 MED ORDER — ASPIRIN 81 MG PO CHEW
324.0000 mg | CHEWABLE_TABLET | ORAL | Status: AC
  Administered 2013-08-03: 324 mg via ORAL
  Filled 2013-08-03: qty 4

## 2013-08-03 MED ORDER — HEPARIN SODIUM (PORCINE) 1000 UNIT/ML IJ SOLN
INTRAMUSCULAR | Status: AC
  Filled 2013-08-03: qty 1

## 2013-08-03 MED ORDER — ACETAMINOPHEN 325 MG PO TABS: 650.0000 mg | ORAL_TABLET | ORAL | Status: DC | PRN

## 2013-08-03 MED ORDER — SODIUM CHLORIDE 0.9 % IJ SOLN: 3.0000 mL | INTRAMUSCULAR | Status: DC | PRN

## 2013-08-03 MED ORDER — FENTANYL CITRATE 0.05 MG/ML IJ SOLN
INTRAMUSCULAR | Status: AC
  Filled 2013-08-03: qty 2

## 2013-08-03 MED ORDER — MIDAZOLAM HCL 2 MG/2ML IJ SOLN
INTRAMUSCULAR | Status: AC
  Filled 2013-08-03: qty 2

## 2013-08-03 MED ORDER — SODIUM CHLORIDE 0.9 % IV SOLN
INTRAVENOUS | Status: DC
  Administered 2013-08-03: 1000 mL via INTRAVENOUS

## 2013-08-03 MED ORDER — SODIUM CHLORIDE 0.9 % IV SOLN: 1.0000 mL/kg/h | INTRAVENOUS | Status: DC

## 2013-08-03 MED ORDER — LIDOCAINE HCL (PF) 1 % IJ SOLN
INTRAMUSCULAR | Status: AC
  Filled 2013-08-03: qty 30

## 2013-08-03 MED ORDER — HEPARIN (PORCINE) IN NACL 2-0.9 UNIT/ML-% IJ SOLN
INTRAMUSCULAR | Status: AC
  Filled 2013-08-03: qty 1500

## 2013-08-03 MED ORDER — VERAPAMIL HCL 2.5 MG/ML IV SOLN
INTRAVENOUS | Status: AC
  Filled 2013-08-03: qty 2

## 2013-08-03 MED ORDER — SODIUM CHLORIDE 0.9 % IJ SOLN: 3.0000 mL | Freq: Two times a day (BID) | INTRAMUSCULAR | Status: DC

## 2013-08-03 MED ORDER — ONDANSETRON HCL 4 MG/2ML IJ SOLN: 4.0000 mg | Freq: Four times a day (QID) | INTRAMUSCULAR | Status: DC | PRN

## 2013-08-03 MED ORDER — SODIUM CHLORIDE 0.9 % IV SOLN: 250.0000 mL | INTRAVENOUS | Status: DC | PRN

## 2013-08-03 NOTE — Interval H&P Note (Signed)
History and Physical Interval Note:  08/03/2013 8:51 AM  [name]  has presented today for surgery, with the diagnosis of Chest pain  The various methods of treatment have been discussed with the patient and family. After consideration of risks, benefits and other options for treatment, the patient has consented to  Procedure(s): LEFT HEART CATHETERIZATION WITH CORONARY ANGIOGRAM (N/A) as a surgical intervention .  The patient's history has been reviewed, patient examined, no change in status, stable for surgery.  I have reviewed the patient's chart and labs.  Questions were answered to the patient's satisfaction.    Cath Lab Visit (complete for each Cath Lab visit)  Clinical Evaluation Leading to the Procedure:   ACS: no  Non-ACS:    Anginal Classification: No Symptoms  Anti-ischemic medical therapy: Maximal Therapy (2 or more classes of medications)  Non-Invasive Test Results: Low-risk stress test findings: cardiac mortality <1%/year  Prior CABG: No previous CABG       [name]

## 2013-08-03 NOTE — H&P (View-Only) (Signed)
Clinical Summary Mr. Vanhise is a 79 y.o.male seen today for follow up.   1. Dyspnea.  - limited physically because of knee pain. Most active thing he does is splitting wood. Cannot walk long distances because of knee pain.  - describes some mild DOE with activity x 6 months, example walking back and forth to the mailbox notes some dyspnea. No orthopnea, no PND, no LE edema.  - he is being considered for a possible knee replacement surgery and is seeking cardiac clearance. - since last visit notes no change in his symptoms. He has completed his stress MPI which showed evidence of ischemia, but was judged as a low risk study - he specifically denies any chest pain, only notes some mild increase in DOE in activities he used to tolerate well.    Past Medical History  Diagnosis Date  . Prostate cancer   . GERD (gastroesophageal reflux disease)   . Hypertension   . Hyperlipemia   . Arthritis   . Inguinal hernia     left     Allergies  Allergen Reactions  . Lipitor [Atorvastatin] Other (See Comments)    "feel bad"     Current Outpatient Prescriptions  Medication Sig Dispense Refill  . amLODipine (NORVASC) 5 MG tablet Take 1 tablet (5 mg total) by mouth daily. One tablet by mouth once daily  90 tablet  3  . hydrochlorothiazide (HYDRODIURIL) 25 MG tablet Take 1 tablet (25 mg total) by mouth daily.  90 tablet  3  . lisinopril (PRINIVIL,ZESTRIL) 40 MG tablet Take 1 tablet (40 mg total) by mouth daily. One tablet by mouth once daily  90 tablet  3  . metoprolol tartrate (LOPRESSOR) 25 MG tablet Take 1 tablet (25 mg total) by mouth 2 (two) times daily. One tablet by mouth once daily  90 tablet  3  . omeprazole (PRILOSEC) 40 MG capsule Take 1 capsule (40 mg total) by mouth daily. Takes one capsule by mouth once daily  90 capsule  3  . oxybutynin (DITROPAN-XL) 10 MG 24 hr tablet Take 1 tablet by mouth daily.      . pramipexole (MIRAPEX) 1 MG tablet Take 1 tablet (1 mg total) by mouth  daily. Takes as directed  90 tablet  3  . pravastatin (PRAVACHOL) 40 MG tablet Take 1 tablet (40 mg total) by mouth daily. Takes one tablet by mouth once daily  90 tablet  3  . testosterone cypionate (DEPOTESTOTERONE CYPIONATE) 200 MG/ML injection Inject 1.5 mLs (300 mg total) into the muscle every 28 (twenty-eight) days.  10 mL  1  . zolpidem (AMBIEN) 10 MG tablet Take 10 mg by mouth at bedtime as needed.       Current Facility-Administered Medications  Medication Dose Route Frequency Provider Last Rate Last Dose  . testosterone cypionate (DEPOTESTOTERONE CYPIONATE) injection 300 mg  300 mg Intramuscular Q28 days Ernestina Penna, MD   300 mg at 07/27/13 1150     Past Surgical History  Procedure Laterality Date  . Hernia repair    . Prostate surgery    . Colonoscopy       Allergies  Allergen Reactions  . Lipitor [Atorvastatin] Other (See Comments)    "feel bad"      Family History  Problem Relation Age of Onset  . Colon cancer Neg Hx      Social History Mr. Mitschke reports that he quit smoking about 34 years ago. He has never used smokeless tobacco. Mr.  Slattery reports that he does not drink alcohol.   Review of Systems CONSTITUTIONAL: No weight loss, fever, chills, weakness or fatigue.  HEENT: Eyes: No visual loss, blurred vision, double vision or yellow sclerae.No hearing loss, sneezing, congestion, runny nose or sore throat.  SKIN: No rash or itching.  CARDIOVASCULAR: per HPI RESPIRATORY: per HPI  GASTROINTESTINAL: No anorexia, nausea, vomiting or diarrhea. No abdominal pain or blood.  GENITOURINARY: No burning on urination, no polyuria NEUROLOGICAL: No headache, dizziness, syncope, paralysis, ataxia, numbness or tingling in the extremities. No change in bowel or bladder control.  MUSCULOSKELETAL: No muscle, back pain, joint pain or stiffness.  LYMPHATICS: No enlarged nodes. No history of splenectomy.  PSYCHIATRIC: No history of depression or anxiety.  ENDOCRINOLOGIC: No  reports of sweating, cold or heat intolerance. No polyuria or polydipsia.  Marland Kitchen   Physical Examination p 74 bp 146/73 Wt 166 lbs BMI 23 Gen: resting comfortably, no acute distress HEENT: no scleral icterus, pupils equal round and reactive, no palptable cervical adenopathy,  CV: RRR, no m/r/g, no JVD Resp: Clear to auscultation bilaterally GI: abdomen is soft, non-tender, non-distended, normal bowel sounds, no hepatosplenomegaly MSK: extremities are warm, no edema.  Skin: warm, no rash Neuro:  no focal deficits Psych: appropriate affect   Diagnostic Studies 06/17/13 Echo: LVEF 55-60%, indeterminate diastolic function with increased LA pressures, mild aortic stenosis (AVA 1.77 by VTI), mild MR, mild RAE  06/17/13 Echo LVEF 55-60%, indet diastolic dysfunction with evidence of increased LA pressure, aortic sclerosis without stenosis, mild MR, severe LAE  07/13/13 Lexiscan MPI Small to moderate sized apical defect partially reversible, apical anteroseptal [place] partially reversible. LVEF 71%. Technically difficult study.    Assessment and Plan  1. Dyspnea/Preoperative Evaluation - patient referred for moderate risk surgery, knee replacement. I was unable to assess his functional capacity by history because he is limited by severe knee pain. He described some mild dyspnea on exertion that developed approx 6 months ago. Of note he lives a sedentary lifestyle and I suspect he would be even more symptomatic if more active. In this setting, I obtained an echo which showed normal [place] motion and mild AS. To further risk stratify him for this elective surgery I obtained a lexiscan MPI which shows 2 mild territories of ischemia.  - he likely has mild CAD based on all these findings that can be managed medically, however given plans for an upcoming elective surgery I feel its important to clarify his coronary anatomy to rule out any high risk lesions. - will arrange cath, if significant high risk  disease consider bare metal stent to lower waiting time for knee surgery. If low risk anatomy, likely can manage medically including beta blocker which he is already on.           Antoine Poche, M.D., F.A.C.C.

## 2013-08-03 NOTE — CV Procedure (Signed)
    Cardiac Catheterization Procedure Note  Name: DEADRICK STIDD MRN: [id number] DOB: 28-Jun-1927  Procedure: Left Heart Cath, Selective Coronary Angiography  Indication: Abnormal stress perfusion study, preoperative evaluation   Procedural Details: The right wrist was prepped, draped, and anesthetized with 1% lidocaine. Using the modified Seldinger technique, a 5 French sheath was introduced into the right radial artery. 3 mg of verapamil was administered through the sheath, weight-based unfractionated heparin was administered intravenously. Standard Judkins catheters were used for selective coronary angiography. Catheter exchanges were performed over an exchange length guidewire. There were no immediate procedural complications. A TR band was used for radial hemostasis at the completion of the procedure.  The patient was transferred to the post catheterization recovery area for further monitoring.  Procedural Findings: Hemodynamics: AO 111/54 LV 110/11  Coronary angiography: Coronary dominance: right  Left mainstem: Proximal and mid-LM are patent. There is distal LM disease involving the ostia of the LCx and LAD - appears nonobstructive but hypodense, less than 50%  Left anterior descending (LAD): The ostial LAD has 40-50% stenosis. The mid-LAD just after the first diagonal has 80-90% eccentric stenosis. The first diagonal is patent and moderate in caliber.  Left circumflex (LCx): The LCx is codominant. The entire proximal vessel is diffusely diseased with 75% stenosis. The mid-LCx at the rist OM has 80-90% stenosis. The first OM origin has 75% stenosis. The mid-LCx after the second OM has diffuse 90% stenosis prior to 3 distal OM branches.  Right coronary artery (RCA): Codominant vessel. Ostial 30-40% stenosis, mid 50% stenosis, PDA patent.  Left ventriculography: deferred  Final Conclusions:   1. Severe 2 vessel CAD involving the mid-LAD and entire proximal and mid-LCx 2.  Mild-moderate RCA stenosis 3. Known normal LV function (Myoview scan with LVEF 71%)  Recommendations: Difficult situation in patient with minimal symptoms but high-risk coronary anatomy who need knee replacement. Reviewed films with colleagues and agree his obstructive CAD   [name] 08/03/2013, 9:33 AM

## 2013-08-05 ENCOUNTER — Encounter: Payer: Self-pay | Admitting: Cardiothoracic Surgery

## 2013-08-05 ENCOUNTER — Institutional Professional Consult (permissible substitution) (INDEPENDENT_AMBULATORY_CARE_PROVIDER_SITE_OTHER): Payer: Medicare PPO | Admitting: Cardiothoracic Surgery

## 2013-08-05 VITALS — BP 130/80 | HR 70 | Resp 16 | Ht 71.0 in | Wt 161.0 lb

## 2013-08-05 DIAGNOSIS — I2510 Atherosclerotic heart disease of native coronary artery without angina pectoris: Secondary | ICD-10-CM

## 2013-08-05 NOTE — Progress Notes (Signed)
Patient ID: [name], male   DOB: 1926-11-07, 79 y.o.   MRN: [id number]      [address]       [place] [address]             [phone]        [name] [place] Medical Record #[id number] Date of Birth: 1926/09/14  Referring: Scott Bollman, MD Primary Care: Scott Heap, MD  Chief Complaint:    Chief Complaint  Patient presents with  . Coronary Artery Disease    Surgical eval on 3VD, Cardiac Cath 08/03/13, ECHO 06/17/13   79 year old patient with three-vessel disease and exertional symptoms Coronary angiograms and the echo cardiogram reviewed  History of Present Illness:     I was asked to evaluate this 79 year old Caucasian male nonsmoker for recently diagnosed severe multivessel CAD. The patient has exhibited exertional shortness of breath. The patient was scheduled for right knee total knee replacement and a cardiology clearance was performed. 2-D echocardiogram revealed EF of 50% with mild aortic sclerosis. Myocardial perfusion scan demonstrated ischemia in the anterolateral [name]. Subsequent cardiac catheterization by Dr. Excell Seltzer demonstrated 90% proximal LAD stenosis, 80% stenosis of the OM1 and distal circumflex, 50% stenosis of the RCA. LVEDP was 12 mm mercury.  The patient's knee replacement was canceled. The patient has been recommended by his cardiologist reviewed for multivessel CABG. Review of his coronary angiogram demonstrates adequate targets to the LAD diagonal, OM one distal circumflex, and potentially the distal RCA.  Although the patient is 58 he lives alone, takes care of the home, and uses a wood stove for heating his home. Approximately a  year ago the patient had a left inguinal hernia repair without cardiac complications.  The patient is status post radical prostatectomy for adenocarcinoma prostate without recurrence and 17 years.  The patient's right knee arthritis is fairly severe although he can walk with a limp. He did not use  a cane or walker  The patient denies any resting symptoms of orthopnea PND angina palpitation presyncope. The patient does get short of breath with walking although he cannot walk very far so it is difficult to determine his exact functional impairment or symptom level. Current Activity/ Functional Status: Patient is independent with mobility/ambulation, transfers, ADL's, IADL's. he does have difficulty walking   Zubrod Score: At the time of surgery this patient's most appropriate activity status/level should be described as: []  Normal activity, no symptoms [x]  Symptoms, fully ambulatory []  Symptoms, in bed less than or equal to 50% of the time []  Symptoms, in bed greater than 50% of the time but less than 100% []  Bedridden []  Moribund  Past Medical History  Diagnosis Date  . Prostate cancer   . GERD (gastroesophageal reflux disease)   . Hypertension   . Hyperlipemia   . Arthritis   . Inguinal hernia     left    Past Surgical History  Procedure Laterality Date  . Hernia repair    . Prostate surgery    . Colonoscopy      History  Smoking status  . Former Smoker -- 1.00 packs/day  . Types: Cigarettes  . Quit date: 08/20/1978  Smokeless tobacco  . Never Used   History  Alcohol Use No    History   Social History  . Marital Status: Widowed    Spouse Name: N/A    Number of Children: 1  . Years of Education: N/A   Occupational History  . Retired  Social History Main Topics  . Smoking status: Former Smoker -- 1.00 packs/day    Types: Cigarettes    Quit date: 08/20/1978  . Smokeless tobacco: Never Used  . Alcohol Use: No  . Drug Use: No  . Sexual Activity: Not on file   Other Topics Concern  . Not on file   Social History Narrative   No caffeine     Allergies  Allergen Reactions  . Lipitor [Atorvastatin] Other (See Comments)    "feel bad"    Current Outpatient Prescriptions  Medication Sig Dispense Refill  . amLODipine (NORVASC) 5 MG tablet  Take 1 tablet (5 mg total) by mouth daily. One tablet by mouth once daily  90 tablet  3  . hydrochlorothiazide (HYDRODIURIL) 25 MG tablet Take 1 tablet (25 mg total) by mouth daily.  90 tablet  3  . HYDROcodone-acetaminophen (NORCO/VICODIN) 5-325 MG per tablet Take 1 tablet by mouth every 4 (four) hours as needed.      Marland Kitchen lisinopril (PRINIVIL,ZESTRIL) 40 MG tablet Take 1 tablet (40 mg total) by mouth daily. One tablet by mouth once daily  90 tablet  3  . metoprolol tartrate (LOPRESSOR) 25 MG tablet Take 1 tablet (25 mg total) by mouth daily.  30 tablet  6  . omeprazole (PRILOSEC) 40 MG capsule Take 40 mg by mouth daily.      Marland Kitchen oxybutynin (DITROPAN-XL) 10 MG 24 hr tablet Take 1 tablet by mouth daily.      . pramipexole (MIRAPEX) 1 MG tablet Take 1 tablet (1 mg total) by mouth daily. Takes as directed  90 tablet  3  . pravastatin (PRAVACHOL) 80 MG tablet Take 80 mg by mouth daily.      Marland Kitchen testosterone cypionate (DEPOTESTOTERONE CYPIONATE) 200 MG/ML injection Inject 1.5 mLs (300 mg total) into the muscle every 28 (twenty-eight) days.  10 mL  1  . zolpidem (AMBIEN) 10 MG tablet Take 10 mg by mouth at bedtime as needed.       Current Facility-Administered Medications  Medication Dose Route Frequency Provider Last Rate Last Dose  . testosterone cypionate (DEPOTESTOTERONE CYPIONATE) injection 300 mg  300 mg Intramuscular Q28 days Ernestina Penna, MD   300 mg at 07/27/13 1150     (Not in a hospital admission)  Family History  Problem Relation Age of Onset  . Colon cancer Neg Hx      Review of Systems:   The patient stopped smoking 35 years ago. The patient denies history of thoracic trauma or abnormal chest x-ray The patient is right-hand dominant The patient denies history DVT or varicose veins The patient denies history of TIA or mini stroke The patient denies diabetes    Cardiac Review of Systems: Y or N  Chest Pain [ N.   ]  Resting SOB [   N.] Exertional SOB  [ Y. ]  Orthopnea [ N. ]     Pedal Edema [ N.  ]    Palpitations [ N. ] Syncope  [  ]   Presyncope [ N.  ]  General Review of Systems: [Y] = yes [  ]=no Constitional: recent weight change [N.  ]; anorexia [  ]; fatigue [  ]; nausea [  ]; night sweats [  ]; fever [N.  ]; or chills [  ]  Dental: poor dentition[  ]; Last Dentist visit one year:   Eye : blurred vision [  ]; diplopia [   ]; vision changes [  ];  Amaurosis fugax[  ]; Resp: cough [  ];  wheezing[  ];  hemoptysis[  ]; shortness of breath[  ]; paroxysmal nocturnal dyspnea[  ]; dyspnea on exertion[ Y. ]; or orthopnea[  ];  GI:  gallstones[  ], vomiting[  ];  dysphagia[Y.-history of hiatal hernia  ]; melena[  ];  hematochezia [  ]; heartburn[  ];   Hx of  Colonoscopy[ Y. ]; GU: kidney stones [  ]; hematuria[  ];   dysuria [  ];  nocturia[  ];  history of     obstruction [  ]; urinary frequency [  ]             Skin: rash, swelling[  ];, hair loss[  ];  peripheral edema[  ];  or itching[  ]; Musculosketetal: myalgias[  ];  joint swelling[  ];  joint erythema[  ];  joint pain[  ];  back pain[  ];  Heme/Lymph: bruising[  ];  bleeding[  ];  anemia[  ];  Neuro: TIA[  ];  headaches[  ];  stroke[  ];  vertigo[  ];  seizures[  ];   paresthesias[  ];  difficulty walking[  ];  Psych:depression[  ]; anxiety[  ];  Endocrine: diabetes[  ];  thyroid dysfunction[  ];  Immunizations: Flu [  ]; Pneumococcal[  ];  Other:  Physical Exam: BP 130/80  Pulse 70  Resp 16  Ht 5\' 11"  (1.803 m)  Wt 161 lb (73.029 kg)  BMI 22.46 kg/m2  SpO2 97%  Gen. appearance-elderly Caucasian male accompanied by her son in no acute distress-he has obvious difficulty walking HEENT-normocephalic pupils with arcus senilis, dentition adequate Neck-no JVD mass or carotid bruit Lymphatics-no palpable adenopathy the neck or supraclavicular fossa Thorax-no deformity or tenderness, breath sounds clear bilaterally Cardiac-regular rhythm without  murmur or gallop Abdomen-no pulsatile mass, no tenderness or organomegaly Extremities-no clubbing cyanosis tenderness or edema-mild swelling right knee Vascular-pulses in the extremities, no venous insufficiency Neuro-no focal motor deficit   Diagnostic Studies & Laboratory data:   Angiogram, echocardiogram reviewed  Recent Radiology Findings:   No results found.  Previous chest x-ray showed mild COPD  Recent Lab Findings: Lab Results  Component Value Date   WBC 8.8 07/28/2013   HGB 16.8 07/28/2013   HCT 51.5 07/28/2013   GLUCOSE 89 07/28/2013   CHOL 123 07/28/2013   TRIG 108 02/17/2013   LDLCALC 59 02/17/2013   ALT 20 07/28/2013   AST 18 07/28/2013   NA 142 07/28/2013   K 4.5 07/28/2013   CL 98 07/28/2013   CREATININE 1.33* 07/28/2013   BUN 23 07/28/2013   CO2 27 07/28/2013   INR 0.99 08/03/2013      Assessment / Plan:     79 year old highly functional male well with arthritis of the right knee has a positive perfusion stress test and severe multivessel CAD. I would recommend multivessel CABG with targets to the LAD diagonal, OM1 and distal circumflex, and possibly the RCA. After surgical coronary graduation he would be a candidate for total knee replacement in approximately 12 weeks.  The patient wishes to his options. We will need after the holidays and discuss his decision whether to proceed with surgery.  Return January 7 to discuss and schedule surgery for CAD     @ME1 @ 08/05/2013 12:34  PM        

## 2013-08-20 DIAGNOSIS — N189 Chronic kidney disease, unspecified: Secondary | ICD-10-CM

## 2013-08-20 HISTORY — DX: Chronic kidney disease, unspecified: N18.9

## 2013-08-26 ENCOUNTER — Ambulatory Visit (INDEPENDENT_AMBULATORY_CARE_PROVIDER_SITE_OTHER): Payer: Medicare PPO | Admitting: *Deleted

## 2013-08-26 ENCOUNTER — Encounter: Payer: Self-pay | Admitting: Cardiothoracic Surgery

## 2013-08-26 ENCOUNTER — Ambulatory Visit (INDEPENDENT_AMBULATORY_CARE_PROVIDER_SITE_OTHER): Payer: Medicare PPO | Admitting: Cardiothoracic Surgery

## 2013-08-26 ENCOUNTER — Other Ambulatory Visit: Payer: Self-pay | Admitting: *Deleted

## 2013-08-26 VITALS — BP 165/75 | HR 56 | Resp 16 | Ht 71.0 in | Wt 161.0 lb

## 2013-08-26 DIAGNOSIS — E349 Endocrine disorder, unspecified: Secondary | ICD-10-CM

## 2013-08-26 DIAGNOSIS — E291 Testicular hypofunction: Secondary | ICD-10-CM

## 2013-08-26 DIAGNOSIS — C61 Malignant neoplasm of prostate: Secondary | ICD-10-CM

## 2013-08-26 DIAGNOSIS — I2510 Atherosclerotic heart disease of native coronary artery without angina pectoris: Secondary | ICD-10-CM

## 2013-08-26 NOTE — Progress Notes (Signed)
PCP is [name], MD Referring Provider is Sherren Mocha, MD  Chief Complaint  Patient presents with  . Coronary Artery Disease    f/u to further discuss surgery    HPI: 80 year old with severe multivessel CAD and class III angina returns to discuss potential CABG. Patient was seen in consultation before the Christmas holidays and surgical coronary revascularization was discussed in depth with the patient and family. He is thought about procedure including expected benefits of improved survival and decreased risk of MI as well as the potential risks of stroke renal failure and death. He wishes to proceed with surgery later this month after he can work off the timing with his son who travels with his work. He is now [facility]  The patient denies any resting symptoms of chest pain orthopnea PND. He has dyspnea on exertion. He has fairly severe osteoarthritis of the right knee and is scheduled knee replacement was canceled after cardiology evaluation demonstrated positive stress test with severe three-vessel CAD. By echo is LVEF is well preserved and there are no significant valvular disorders. He is in a sinus rhythm.  Patient had previous suprapubic prostatectomy and a little hernia repairs under general anesthesia without complication   Past Medical History  Diagnosis Date  . Prostate cancer   . GERD (gastroesophageal reflux disease)   . Hypertension   . Hyperlipemia   . Arthritis   . Inguinal hernia     left    Past Surgical History  Procedure Laterality Date  . Hernia repair    . Prostate surgery    . Colonoscopy      Family History  Problem Relation Age of Onset  . Colon cancer Neg Hx     Social History History  Substance Use Topics  . Smoking status: Former Smoker -- 1.00 packs/day    Types: Cigarettes    Quit date: 08/20/1978  . Smokeless tobacco: Never Used  . Alcohol Use: No    Current Outpatient Prescriptions   Medication Sig Dispense Refill  . amLODipine (NORVASC) 5 MG tablet Take 1 tablet (5 mg total) by mouth daily. One tablet by mouth once daily  90 tablet  3  . hydrochlorothiazide (HYDRODIURIL) 25 MG tablet Take 1 tablet (25 mg total) by mouth daily.  90 tablet  3  . HYDROcodone-acetaminophen (NORCO/VICODIN) 5-325 MG per tablet Take 1 tablet by mouth every 4 (four) hours as needed.      Marland Kitchen lisinopril (PRINIVIL,ZESTRIL) 40 MG tablet Take 1 tablet (40 mg total) by mouth daily. One tablet by mouth once daily  90 tablet  3  . metoprolol tartrate (LOPRESSOR) 25 MG tablet Take 1 tablet (25 mg total) by mouth daily.  30 tablet  6  . omeprazole (PRILOSEC) 40 MG capsule Take 40 mg by mouth daily.      Marland Kitchen oxybutynin (DITROPAN-XL) 10 MG 24 hr tablet Take 1 tablet by mouth daily.      . pramipexole (MIRAPEX) 1 MG tablet Take 1 tablet (1 mg total) by mouth daily. Takes as directed  90 tablet  3  . pravastatin (PRAVACHOL) 80 MG tablet Take 80 mg by mouth daily.      Marland Kitchen testosterone cypionate (DEPOTESTOTERONE CYPIONATE) 200 MG/ML injection Inject 1.5 mLs (300 mg total) into the muscle every 28 (twenty-eight) days.  10 mL  1  . zolpidem (AMBIEN) 10 MG tablet Take 10 mg by mouth at bedtime as needed.  Current Facility-Administered Medications  Medication Dose Route Frequency Provider Last Rate Last Dose  . testosterone cypionate (DEPOTESTOTERONE CYPIONATE) injection 300 mg  300 mg Intramuscular Q28 days Chipper Herb, MD   300 mg at 07/27/13 1150    Allergies  Allergen Reactions  . Lipitor [Atorvastatin] Other (See Comments)    "feel bad"    Review of Systems Patient lives alone Weight has been stable No symptoms of viral illness or influenza No change in vision or bowel habits  BP 165/75  Pulse 56  Resp 16  Ht 5\' 11"  (1.803 m)  Wt 161 lb (73.029 kg)  BMI 22.46 kg/m2  SpO2 98% Physical Exam  Alert and oriented no complaints HEENT normocephalic dentition good Neck without JVD or  bruit Thorax without tenderness or deformity, breath sounds clear  Good tidal volume Cardiac regular rhythm with faint 2/6 systolic murmur of aortic sclerosis Abdomen nontender without pulsatile mass, no hernia Extremities without edema tenderness or cyanosis Neuro intact walks with a limp favoring right leg   Diagnostic Tests: Carotid duplex pending Coronary angiogram showed three-vessel disease  Impression: Severe CAD with positive stress test plan CABG January 15 at [facility]  Plan: Return for surgery next week.

## 2013-08-26 NOTE — Progress Notes (Signed)
Testosterone injection given and tolerated well.  

## 2013-08-26 NOTE — Patient Instructions (Signed)
Testosterone injection What is this medicine? TESTOSTERONE (tes TOS ter one) is the main male hormone. It supports normal male development such as muscle growth, facial hair, and deep voice. It is used in males to treat low testosterone levels. This medicine may be used for other purposes; ask your health care provider or pharmacist if you have questions. COMMON BRAND NAME(S): Andro-L.A., Aveed, Delatestryl, Depo-Testosterone, Virilon What should I tell my health care provider before I take this medicine? They need to know if you have any of these conditions: -breast cancer -diabetes -heart disease -kidney disease -liver disease -lung disease -prostate cancer, enlargement -an unusual or allergic reaction to testosterone, other medicines, foods, dyes, or preservatives -pregnant or trying to get pregnant -breast-feeding How should I use this medicine? This medicine is for injection into a muscle. It is usually given by a health care professional in a hospital or clinic setting. Contact your pediatrician regarding the use of this medicine in children. While this medicine may be prescribed for children as young as 32 years of age for selected conditions, precautions do apply. Overdosage: If you think you have taken too much of this medicine contact a poison control center or emergency room at once. NOTE: This medicine is only for you. Do not share this medicine with others. What if I miss a dose? Try not to miss a dose. Your doctor or health care professional will tell you when your next injection is due. Notify the office if you are unable to keep an appointment. What may interact with this medicine? -medicines for diabetes -medicines that treat or prevent blood clots like warfarin -oxyphenbutazone -propranolol -steroid medicines like prednisone or cortisone This list may not describe all possible interactions. Give your health care provider a list of all the medicines, herbs,  non-prescription drugs, or dietary supplements you use. Also tell them if you smoke, drink alcohol, or use illegal drugs. Some items may interact with your medicine. What should I watch for while using this medicine? Visit your doctor or health care professional for regular checks on your progress. They will need to check the level of testosterone in your blood. This medicine may affect blood sugar levels. If you have diabetes, check with your doctor or health care professional before you change your diet or the dose of your diabetic medicine. This drug is banned from use in athletes by most athletic organizations. What side effects may I notice from receiving this medicine? Side effects that you should report to your doctor or health care professional as soon as possible: -allergic reactions like skin rash, itching or hives, swelling of the face, lips, or tongue -breast enlargement -breathing problems -changes in mood, especially anger, depression, or rage -dark urine -general ill feeling or flu-like symptoms -light-colored stools -loss of appetite, nausea -nausea, vomiting -right upper belly pain -stomach pain -swelling of ankles -too frequent or persistent erections -trouble passing urine or change in the amount of urine -unusually weak or tired -yellowing of the eyes or skin Additional side effects that can occur in women include: -deep or hoarse voice -facial hair growth -irregular menstrual periods Side effects that usually do not require medical attention (report to your doctor or health care professional if they continue or are bothersome): -acne -change in sex drive or performance -hair loss -headache This list may not describe all possible side effects. Call your doctor for medical advice about side effects. You may report side effects to FDA at 1-800-FDA-1088. Where should I keep my medicine? Keep  out of the reach of children. This medicine can be abused. Keep your  medicine in a safe place to protect it from theft. Do not share this medicine with anyone. Selling or giving away this medicine is dangerous and against the law. Store at room temperature between 20 and 25 degrees C (68 and 77 degrees F). Do not freeze. Protect from light. Follow the directions for the product you are prescribed. Throw away any unused medicine after the expiration date. NOTE: This sheet is a summary. It may not cover all possible information. If you have questions about this medicine, talk to your doctor, pharmacist, or health care provider.  2014, Elsevier/Gold Standard. (2007-10-17 16:13:46)

## 2013-08-27 ENCOUNTER — Encounter (HOSPITAL_COMMUNITY): Payer: Self-pay

## 2013-09-01 ENCOUNTER — Encounter (HOSPITAL_COMMUNITY): Payer: Self-pay

## 2013-09-01 ENCOUNTER — Encounter (HOSPITAL_COMMUNITY)
Admission: RE | Admit: 2013-09-01 | Discharge: 2013-09-01 | Disposition: A | Discharge status: Home / Self Care | Payer: Medicare PPO | Source: Ambulatory Visit | Attending: Cardiothoracic Surgery | Admitting: Cardiothoracic Surgery

## 2013-09-01 ENCOUNTER — Ambulatory Visit (HOSPITAL_COMMUNITY)
Admission: RE | Admit: 2013-09-01 | Discharge: 2013-09-01 | Disposition: A | Discharge status: Home / Self Care | Payer: Medicare PPO | Source: Ambulatory Visit | Attending: Cardiothoracic Surgery | Admitting: Cardiothoracic Surgery

## 2013-09-01 VITALS — BP 134/78 | HR 64 | Temp 97.8°F | Resp 16 | Ht 71.0 in | Wt 161.0 lb

## 2013-09-01 DIAGNOSIS — I2510 Atherosclerotic heart disease of native coronary artery without angina pectoris: Secondary | ICD-10-CM | POA: Insufficient documentation

## 2013-09-01 DIAGNOSIS — Z01810 Encounter for preprocedural cardiovascular examination: Secondary | ICD-10-CM

## 2013-09-01 DIAGNOSIS — Z01811 Encounter for preprocedural respiratory examination: Secondary | ICD-10-CM | POA: Insufficient documentation

## 2013-09-01 DIAGNOSIS — J988 Other specified respiratory disorders: Secondary | ICD-10-CM | POA: Insufficient documentation

## 2013-09-01 DIAGNOSIS — Z01818 Encounter for other preprocedural examination: Secondary | ICD-10-CM | POA: Insufficient documentation

## 2013-09-01 DIAGNOSIS — Z01812 Encounter for preprocedural laboratory examination: Secondary | ICD-10-CM | POA: Insufficient documentation

## 2013-09-01 LAB — SURGICAL PCR SCREEN
MRSA, PCR: NEGATIVE
Staphylococcus aureus: NEGATIVE

## 2013-09-01 LAB — PULMONARY FUNCTION TEST
DL/VA % pred: 108 %
DL/VA: 4.81 ml/min/mmHg/L
DLCO unc % pred: 63 %
DLCO unc: 18.78 ml/min/mmHg
FEF 25-75 Post: 1.4 L/sec
FEF 25-75 Pre: 0.89 L/sec
FEF2575-%Change-Post: 56 %
FEF2575-%Pred-Post: 89 %
FEF2575-%Pred-Pre: 57 %
FEV1-%Change-Post: 10 %
FEV1-%Pred-Post: 79 %
FEV1-%Pred-Pre: 71 %
FEV1-Post: 1.93 L
FEV1-Pre: 1.74 L
FEV1FVC-%Change-Post: 11 %
FEV1FVC-%Pred-Pre: 95 %
FEV6-%Change-Post: 0 %
FEV6-%Pred-Post: 79 %
FEV6-%Pred-Pre: 78 %
FEV6-Post: 2.55 L
FEV6-Pre: 2.55 L
FEV6FVC-%Change-Post: 0 %
FEV6FVC-%Pred-Post: 108 %
FEV6FVC-%Pred-Pre: 107 %
FVC-%Change-Post: 0 %
FVC-%Pred-Post: 72 %
FVC-%Pred-Pre: 73 %
FVC-Post: 2.55 L
FVC-Pre: 2.57 L
Post FEV1/FVC ratio: 75 %
Post FEV6/FVC ratio: 100 %
Pre FEV1/FVC ratio: 68 %
Pre FEV6/FVC Ratio: 99 %
RV % pred: 104 %
RV: 2.75 L
TLC % pred: 77 %
TLC: 5.2 L

## 2013-09-01 LAB — CBC
HCT: 46 % (ref 39.0–52.0)
Hemoglobin: 15.8 g/dL (ref 13.0–17.0)
MCH: 32.6 pg (ref 26.0–34.0)
MCHC: 34.3 g/dL (ref 30.0–36.0)
MCV: 95 fL (ref 78.0–100.0)
Platelets: 218 10*3/uL (ref 150–400)
RBC: 4.84 MIL/uL (ref 4.22–5.81)
RDW: 13.9 % (ref 11.5–15.5)
WBC: 10.6 10*3/uL — ABNORMAL HIGH (ref 4.0–10.5)

## 2013-09-01 LAB — URINALYSIS, ROUTINE W REFLEX MICROSCOPIC
Bilirubin Urine: NEGATIVE
Glucose, UA: NEGATIVE mg/dL
Hgb urine dipstick: NEGATIVE
Ketones, ur: 15 mg/dL — AB
Leukocytes, UA: NEGATIVE
Nitrite: NEGATIVE
Protein, ur: NEGATIVE mg/dL
Specific Gravity, Urine: 1.022 (ref 1.005–1.030)
Urobilinogen, UA: 0.2 mg/dL (ref 0.0–1.0)
pH: 6 (ref 5.0–8.0)

## 2013-09-01 LAB — COMPREHENSIVE METABOLIC PANEL
ALT: 16 U/L (ref 0–53)
AST: 21 U/L (ref 0–37)
Albumin: 3.7 g/dL (ref 3.5–5.2)
Alkaline Phosphatase: 68 U/L (ref 39–117)
BUN: 16 mg/dL (ref 6–23)
CO2: 22 mEq/L (ref 19–32)
Calcium: 9.5 mg/dL (ref 8.4–10.5)
Chloride: 104 mEq/L (ref 96–112)
Creatinine, Ser: 1.1 mg/dL (ref 0.50–1.35)
GFR calc Af Amer: 69 mL/min — ABNORMAL LOW (ref >90)
GFR calc non Af Amer: 60 mL/min — ABNORMAL LOW (ref >90)
Glucose, Bld: 89 mg/dL (ref 70–99)
Potassium: 4 mEq/L (ref 3.7–5.3)
Sodium: 141 mEq/L (ref 137–147)
Total Bilirubin: 0.4 mg/dL (ref 0.3–1.2)
Total Protein: 6.8 g/dL (ref 6.0–8.3)

## 2013-09-01 LAB — PROTIME-INR
INR: 1.03 (ref 0.00–1.49)
Prothrombin Time: 13.3 seconds (ref 11.6–15.2)

## 2013-09-01 LAB — APTT: aPTT: 28 seconds (ref 24–37)

## 2013-09-01 LAB — HEMOGLOBIN A1C
Hgb A1c MFr Bld: 5.2 % (ref <5.7)
Mean Plasma Glucose: 103 mg/dL (ref <117)

## 2013-09-01 LAB — ABO/RH: ABO/RH(D): AB NEG

## 2013-09-01 MED ORDER — CHLORHEXIDINE GLUCONATE 4 % EX LIQD: 30.0000 mL | CUTANEOUS | Status: DC

## 2013-09-01 MED ORDER — ALBUTEROL SULFATE (2.5 MG/3ML) 0.083% IN NEBU
2.5000 mg | INHALATION_SOLUTION | Freq: Once | RESPIRATORY_TRACT | Status: AC
  Administered 2013-09-01: 2.5 mg via RESPIRATORY_TRACT

## 2013-09-01 NOTE — Pre-Procedure Instructions (Signed)
[name]  09/01/2013   Your procedure is scheduled on:  September 03, 2013  Report to [facility] (Entrance A) at 5:30 AM.  Call this number if you have problems the morning of surgery: [phone]   Remember:   Do not eat food or drink liquids after midnight.   Take these medicines the morning of surgery with A SIP OF WATER: amLODipine (NORVASC), HYDROcodone-acetaminophen (NORCO/VICODIN) as needed, metoprolol  tartrate (LOPRESSOR), omeprazole (PRILOSEC), oxybutynin (DITROPAN-XL), pramipexole (MIRAPEX)    Do not wear jewelry.  Do not wear lotions, powders, or colognes. You may NOT wear deodorant.  Men may shave face and neck only.  Do not bring valuables to the hospital.  [facility] is not responsible for any belongings or valuables.               Contacts, dentures or bridgework may not be worn into surgery.  Leave suitcase in the car. After surgery it may be brought to your room.  For patients admitted to the hospital, discharge time is determined by your                treatment team.               Patients discharged the day of surgery will not be allowed to drive  home.  Name and phone number of your driver:   Special Instructions: Shower using CHG 2 nights before surgery and the night before surgery.  If you shower the day of surgery use CHG.  Use special wash - you have one bottle of CHG for all showers.  You should use approximately 1/3 of the bottle for each shower.   Please read over the following fact sheets that you were given: Pain Booklet, Coughing and Deep Breathing, Blood Transfusion Information and Surgical Site Infection Prevention

## 2013-09-02 MED ORDER — SODIUM CHLORIDE 0.9 % IV SOLN
INTRAVENOUS | Status: AC
  Administered 2013-09-03: 69.8 mL/h via INTRAVENOUS
  Filled 2013-09-02 (×2): qty 40

## 2013-09-02 MED ORDER — PHENYLEPHRINE HCL 10 MG/ML IJ SOLN
30.0000 ug/min | INTRAVENOUS | Status: AC
  Administered 2013-09-03: 25 ug/min via INTRAVENOUS
  Filled 2013-09-02 (×2): qty 2

## 2013-09-02 MED ORDER — METOPROLOL TARTRATE 12.5 MG HALF TABLET: 12.5000 mg | ORAL_TABLET | Freq: Once | ORAL | Status: DC

## 2013-09-02 MED ORDER — POTASSIUM CHLORIDE 2 MEQ/ML IV SOLN
80.0000 meq | INTRAVENOUS | Status: DC
  Filled 2013-09-02 (×2): qty 40

## 2013-09-02 MED ORDER — DEXTROSE 5 % IV SOLN
1.5000 g | INTRAVENOUS | Status: AC
  Administered 2013-09-03: 1.5 g via INTRAVENOUS
  Administered 2013-09-03: .75 g via INTRAVENOUS
  Filled 2013-09-02 (×2): qty 1.5

## 2013-09-02 MED ORDER — MAGNESIUM SULFATE 50 % IJ SOLN
40.0000 meq | INTRAMUSCULAR | Status: DC
  Filled 2013-09-02 (×2): qty 10

## 2013-09-02 MED ORDER — PAPAVERINE HCL 30 MG/ML IJ SOLN
INTRAMUSCULAR | Status: AC
  Administered 2013-09-03: 09:00:00
  Filled 2013-09-02 (×2): qty 2.5

## 2013-09-02 MED ORDER — SODIUM CHLORIDE 0.9 % IV SOLN
INTRAVENOUS | Status: AC
  Administered 2013-09-03: .9 [IU]/h via INTRAVENOUS
  Filled 2013-09-02 (×2): qty 1

## 2013-09-02 MED ORDER — VANCOMYCIN HCL 10 G IV SOLR
1250.0000 mg | INTRAVENOUS | Status: AC
  Administered 2013-09-03: 1250 mg via INTRAVENOUS
  Filled 2013-09-02 (×2): qty 1250

## 2013-09-02 MED ORDER — EPINEPHRINE HCL 1 MG/ML IJ SOLN
0.5000 ug/min | INTRAVENOUS | Status: DC
  Filled 2013-09-02 (×2): qty 4

## 2013-09-02 MED ORDER — DEXTROSE 5 % IV SOLN
750.0000 mg | INTRAVENOUS | Status: DC
  Filled 2013-09-02 (×2): qty 750

## 2013-09-02 MED ORDER — NITROGLYCERIN IN D5W 200-5 MCG/ML-% IV SOLN
2.0000 ug/min | INTRAVENOUS | Status: AC
  Administered 2013-09-03: 5 ug/min via INTRAVENOUS
  Filled 2013-09-02 (×2): qty 250

## 2013-09-02 MED ORDER — SODIUM CHLORIDE 0.9 % IV SOLN
INTRAVENOUS | Status: DC
  Filled 2013-09-02 (×2): qty 30

## 2013-09-02 MED ORDER — DOPAMINE-DEXTROSE 3.2-5 MG/ML-% IV SOLN
2.0000 ug/kg/min | INTRAVENOUS | Status: AC
  Administered 2013-09-03: 2.5 ug/kg/min via INTRAVENOUS
  Filled 2013-09-02: qty 250

## 2013-09-02 MED ORDER — DEXMEDETOMIDINE HCL IN NACL 400 MCG/100ML IV SOLN
0.1000 ug/kg/h | INTRAVENOUS | Status: AC
  Administered 2013-09-03: 0.2 ug/kg/h via INTRAVENOUS
  Filled 2013-09-02 (×2): qty 100

## 2013-09-03 ENCOUNTER — Encounter (HOSPITAL_COMMUNITY)
Admission: RE | Disposition: A | Discharge status: Skilled Nursing Facility | Payer: Medicare PPO | Source: Ambulatory Visit | Attending: Cardiothoracic Surgery

## 2013-09-03 ENCOUNTER — Encounter (HOSPITAL_COMMUNITY): Payer: Medicare PPO | Admitting: Certified Registered Nurse Anesthetist

## 2013-09-03 ENCOUNTER — Inpatient Hospital Stay (HOSPITAL_COMMUNITY): Payer: Medicare PPO

## 2013-09-03 ENCOUNTER — Encounter (HOSPITAL_COMMUNITY): Payer: Self-pay | Admitting: Certified Registered Nurse Anesthetist

## 2013-09-03 ENCOUNTER — Inpatient Hospital Stay (HOSPITAL_COMMUNITY)
Admission: RE | Admit: 2013-09-03 | Discharge: 2013-09-08 | DRG: 236 | Disposition: A | Discharge status: Skilled Nursing Facility | Payer: Medicare PPO | Source: Ambulatory Visit | Attending: Cardiothoracic Surgery | Admitting: Cardiothoracic Surgery

## 2013-09-03 ENCOUNTER — Inpatient Hospital Stay (HOSPITAL_COMMUNITY): Payer: Medicare PPO | Admitting: Certified Registered Nurse Anesthetist

## 2013-09-03 DIAGNOSIS — Z01818 Encounter for other preprocedural examination: Secondary | ICD-10-CM

## 2013-09-03 DIAGNOSIS — Z87891 Personal history of nicotine dependence: Secondary | ICD-10-CM

## 2013-09-03 DIAGNOSIS — D62 Acute posthemorrhagic anemia: Secondary | ICD-10-CM | POA: Diagnosis not present

## 2013-09-03 DIAGNOSIS — K219 Gastro-esophageal reflux disease without esophagitis: Secondary | ICD-10-CM | POA: Diagnosis present

## 2013-09-03 DIAGNOSIS — K449 Diaphragmatic hernia without obstruction or gangrene: Secondary | ICD-10-CM | POA: Diagnosis present

## 2013-09-03 DIAGNOSIS — I10 Essential (primary) hypertension: Secondary | ICD-10-CM | POA: Diagnosis present

## 2013-09-03 DIAGNOSIS — E785 Hyperlipidemia, unspecified: Secondary | ICD-10-CM | POA: Diagnosis present

## 2013-09-03 DIAGNOSIS — G709 Myoneural disorder, unspecified: Secondary | ICD-10-CM | POA: Diagnosis present

## 2013-09-03 DIAGNOSIS — Z01810 Encounter for preprocedural cardiovascular examination: Secondary | ICD-10-CM

## 2013-09-03 DIAGNOSIS — I4891 Unspecified atrial fibrillation: Secondary | ICD-10-CM | POA: Diagnosis not present

## 2013-09-03 DIAGNOSIS — I2510 Atherosclerotic heart disease of native coronary artery without angina pectoris: Secondary | ICD-10-CM

## 2013-09-03 DIAGNOSIS — J90 Pleural effusion, not elsewhere classified: Secondary | ICD-10-CM | POA: Diagnosis not present

## 2013-09-03 DIAGNOSIS — C61 Malignant neoplasm of prostate: Secondary | ICD-10-CM

## 2013-09-03 DIAGNOSIS — E349 Endocrine disorder, unspecified: Secondary | ICD-10-CM

## 2013-09-03 DIAGNOSIS — Z951 Presence of aortocoronary bypass graft: Secondary | ICD-10-CM

## 2013-09-03 DIAGNOSIS — M1710 Unilateral primary osteoarthritis, unspecified knee: Secondary | ICD-10-CM | POA: Diagnosis present

## 2013-09-03 DIAGNOSIS — J9819 Other pulmonary collapse: Secondary | ICD-10-CM | POA: Diagnosis not present

## 2013-09-03 DIAGNOSIS — Z01812 Encounter for preprocedural laboratory examination: Secondary | ICD-10-CM

## 2013-09-03 DIAGNOSIS — E8779 Other fluid overload: Secondary | ICD-10-CM | POA: Diagnosis not present

## 2013-09-03 DIAGNOSIS — Z8546 Personal history of malignant neoplasm of prostate: Secondary | ICD-10-CM

## 2013-09-03 DIAGNOSIS — Z01811 Encounter for preprocedural respiratory examination: Secondary | ICD-10-CM

## 2013-09-03 DIAGNOSIS — I4949 Other premature depolarization: Secondary | ICD-10-CM | POA: Diagnosis not present

## 2013-09-03 DIAGNOSIS — G2581 Restless legs syndrome: Secondary | ICD-10-CM | POA: Diagnosis present

## 2013-09-03 HISTORY — PX: CORONARY ARTERY BYPASS GRAFT: SHX141

## 2013-09-03 HISTORY — PX: INTRAOPERATIVE TRANSESOPHAGEAL ECHOCARDIOGRAM: SHX5062

## 2013-09-03 LAB — POCT I-STAT 4, (NA,K, GLUC, HGB,HCT)
Glucose, Bld: 89 mg/dL (ref 70–99)
Glucose, Bld: 89 mg/dL (ref 70–99)
Glucose, Bld: 94 mg/dL (ref 70–99)
Glucose, Bld: 103 mg/dL — ABNORMAL HIGH (ref 70–99)
Glucose, Bld: 105 mg/dL — ABNORMAL HIGH (ref 70–99)
HCT: 39 % (ref 39.0–52.0)
HCT: 30 % — ABNORMAL LOW (ref 39.0–52.0)
HCT: 36 % — ABNORMAL LOW (ref 39.0–52.0)
HCT: 29 % — ABNORMAL LOW (ref 39.0–52.0)
HCT: 28 % — ABNORMAL LOW (ref 39.0–52.0)
Hemoglobin: 13.3 g/dL (ref 13.0–17.0)
Hemoglobin: 10.2 g/dL — ABNORMAL LOW (ref 13.0–17.0)
Hemoglobin: 12.2 g/dL — ABNORMAL LOW (ref 13.0–17.0)
Hemoglobin: 9.9 g/dL — ABNORMAL LOW (ref 13.0–17.0)
Hemoglobin: 9.5 g/dL — ABNORMAL LOW (ref 13.0–17.0)
Potassium: 3.5 meq/L — ABNORMAL LOW (ref 3.7–5.3)
Potassium: 3.4 meq/L — ABNORMAL LOW (ref 3.7–5.3)
Potassium: 3.6 mEq/L — ABNORMAL LOW (ref 3.7–5.3)
Potassium: 4 meq/L (ref 3.7–5.3)
Potassium: 3.6 meq/L — ABNORMAL LOW (ref 3.7–5.3)
Sodium: 141 meq/L (ref 137–147)
Sodium: 138 meq/L (ref 137–147)
Sodium: 141 mEq/L (ref 137–147)
Sodium: 139 meq/L (ref 137–147)
Sodium: 140 meq/L (ref 137–147)

## 2013-09-03 LAB — CBC
HCT: 33.9 % — ABNORMAL LOW (ref 39.0–52.0)
HCT: 33.4 % — ABNORMAL LOW (ref 39.0–52.0)
HEMOGLOBIN: 11.5 g/dL — AB (ref 13.0–17.0)
Hemoglobin: 11.3 g/dL — ABNORMAL LOW (ref 13.0–17.0)
MCH: 32 pg (ref 26.0–34.0)
MCH: 32.2 pg (ref 26.0–34.0)
MCHC: 33.9 g/dL (ref 30.0–36.0)
MCHC: 33.8 g/dL (ref 30.0–36.0)
MCV: 94.4 fL (ref 78.0–100.0)
MCV: 95.2 fL (ref 78.0–100.0)
Platelets: 187 10*3/uL (ref 150–400)
Platelets: 188 10*3/uL (ref 150–400)
RBC: 3.59 MIL/uL — AB (ref 4.22–5.81)
RBC: 3.51 MIL/uL — ABNORMAL LOW (ref 4.22–5.81)
RDW: 13.7 % (ref 11.5–15.5)
RDW: 13.9 % (ref 11.5–15.5)
WBC: 12.8 10*3/uL — ABNORMAL HIGH (ref 4.0–10.5)
WBC: 14.6 10*3/uL — ABNORMAL HIGH (ref 4.0–10.5)

## 2013-09-03 LAB — POCT I-STAT 3, ART BLOOD GAS (G3+)
Acid-Base Excess: 4 mmol/L — ABNORMAL HIGH (ref 0.0–2.0)
Acid-Base Excess: 3 mmol/L — ABNORMAL HIGH (ref 0.0–2.0)
Acid-Base Excess: 2 mmol/L (ref 0.0–2.0)
Bicarbonate: 28 meq/L — ABNORMAL HIGH (ref 20.0–24.0)
Bicarbonate: 26.9 meq/L — ABNORMAL HIGH (ref 20.0–24.0)
Bicarbonate: 26.6 mEq/L — ABNORMAL HIGH (ref 20.0–24.0)
Bicarbonate: 26.3 mEq/L — ABNORMAL HIGH (ref 20.0–24.0)
Bicarbonate: 25.9 mEq/L — ABNORMAL HIGH (ref 20.0–24.0)
Bicarbonate: 26.6 mEq/L — ABNORMAL HIGH (ref 20.0–24.0)
O2 Saturation: 100 %
O2 Saturation: 100 %
O2 Saturation: 99 %
O2 Saturation: 99 %
O2 Saturation: 98 %
O2 Saturation: 98 %
Patient temperature: 36.3
Patient temperature: 36.4
Patient temperature: 36.4
Patient temperature: 36.4
TCO2: 29 mmol/L (ref 0–100)
TCO2: 28 mmol/L (ref 0–100)
TCO2: 28 mmol/L (ref 0–100)
TCO2: 28 mmol/L (ref 0–100)
TCO2: 27 mmol/L (ref 0–100)
TCO2: 28 mmol/L (ref 0–100)
pCO2 arterial: 41 mmHg (ref 35.0–45.0)
pCO2 arterial: 39.2 mmHg (ref 35.0–45.0)
pCO2 arterial: 38.9 mmHg (ref 35.0–45.0)
pCO2 arterial: 45.7 mmHg — ABNORMAL HIGH (ref 35.0–45.0)
pCO2 arterial: 47.1 mmHg — ABNORMAL HIGH (ref 35.0–45.0)
pCO2 arterial: 50 mmHg — ABNORMAL HIGH (ref 35.0–45.0)
pH, Arterial: 7.443 (ref 7.350–7.450)
pH, Arterial: 7.444 (ref 7.350–7.450)
pH, Arterial: 7.44 (ref 7.350–7.450)
pH, Arterial: 7.366 (ref 7.350–7.450)
pH, Arterial: 7.346 — ABNORMAL LOW (ref 7.350–7.450)
pH, Arterial: 7.331 — ABNORMAL LOW (ref 7.350–7.450)
pO2, Arterial: 364 mmHg — ABNORMAL HIGH (ref 80.0–100.0)
pO2, Arterial: 322 mmHg — ABNORMAL HIGH (ref 80.0–100.0)
pO2, Arterial: 153 mmHg — ABNORMAL HIGH (ref 80.0–100.0)
pO2, Arterial: 127 mmHg — ABNORMAL HIGH (ref 80.0–100.0)
pO2, Arterial: 105 mmHg — ABNORMAL HIGH (ref 80.0–100.0)
pO2, Arterial: 108 mmHg — ABNORMAL HIGH (ref 80.0–100.0)

## 2013-09-03 LAB — PROTIME-INR
INR: 1.44 (ref 0.00–1.49)
PROTHROMBIN TIME: 17.2 s — AB (ref 11.6–15.2)

## 2013-09-03 LAB — POCT I-STAT, CHEM 8
BUN: 11 mg/dL (ref 6–23)
BUN: 12 mg/dL (ref 6–23)
Calcium, Ion: 1.2 mmol/L (ref 1.13–1.30)
Calcium, Ion: 1.18 mmol/L (ref 1.13–1.30)
Chloride: 103 mEq/L (ref 96–112)
Chloride: 101 mEq/L (ref 96–112)
Creatinine, Ser: 1 mg/dL (ref 0.50–1.35)
Creatinine, Ser: 1 mg/dL (ref 0.50–1.35)
Glucose, Bld: 101 mg/dL — ABNORMAL HIGH (ref 70–99)
Glucose, Bld: 134 mg/dL — ABNORMAL HIGH (ref 70–99)
HCT: 35 % — ABNORMAL LOW (ref 39.0–52.0)
HCT: 32 % — ABNORMAL LOW (ref 39.0–52.0)
Hemoglobin: 11.9 g/dL — ABNORMAL LOW (ref 13.0–17.0)
Hemoglobin: 10.9 g/dL — ABNORMAL LOW (ref 13.0–17.0)
Potassium: 3.3 mEq/L — ABNORMAL LOW (ref 3.7–5.3)
Potassium: 4.4 mEq/L (ref 3.7–5.3)
Sodium: 138 mEq/L (ref 137–147)
Sodium: 140 mEq/L (ref 137–147)
TCO2: 26 mmol/L (ref 0–100)
TCO2: 27 mmol/L (ref 0–100)

## 2013-09-03 LAB — CREATININE, SERUM
Creatinine, Ser: 0.97 mg/dL (ref 0.50–1.35)
GFR calc Af Amer: 85 mL/min — ABNORMAL LOW (ref >90)
GFR calc non Af Amer: 74 mL/min — ABNORMAL LOW (ref >90)

## 2013-09-03 LAB — HEMOGLOBIN AND HEMATOCRIT, BLOOD
HCT: 29.4 % — ABNORMAL LOW (ref 39.0–52.0)
Hemoglobin: 10.1 g/dL — ABNORMAL LOW (ref 13.0–17.0)

## 2013-09-03 LAB — GLUCOSE, CAPILLARY
Glucose-Capillary: 103 mg/dL — ABNORMAL HIGH (ref 70–99)
Glucose-Capillary: 100 mg/dL — ABNORMAL HIGH (ref 70–99)
Glucose-Capillary: 104 mg/dL — ABNORMAL HIGH (ref 70–99)
Glucose-Capillary: 108 mg/dL — ABNORMAL HIGH (ref 70–99)
Glucose-Capillary: 142 mg/dL — ABNORMAL HIGH (ref 70–99)

## 2013-09-03 LAB — MAGNESIUM: Magnesium: 2.6 mg/dL — ABNORMAL HIGH (ref 1.5–2.5)

## 2013-09-03 LAB — PLATELET COUNT: Platelets: 140 10*3/uL — ABNORMAL LOW (ref 150–400)

## 2013-09-03 LAB — APTT: aPTT: 37 seconds (ref 24–37)

## 2013-09-03 SURGERY — CORONARY ARTERY BYPASS GRAFTING (CABG)
Anesthesia: General | Site: Chest

## 2013-09-03 MED ORDER — DOCUSATE SODIUM 100 MG PO CAPS
200.0000 mg | ORAL_CAPSULE | Freq: Every day | ORAL | Status: DC
  Administered 2013-09-04 – 2013-09-08 (×5): 200 mg via ORAL
  Filled 2013-09-03 (×5): qty 2

## 2013-09-03 MED ORDER — ASPIRIN 81 MG PO CHEW: 324.0000 mg | CHEWABLE_TABLET | Freq: Every day | ORAL | Status: DC

## 2013-09-03 MED ORDER — POTASSIUM CHLORIDE 10 MEQ/50ML IV SOLN
10.0000 meq | Freq: Once | INTRAVENOUS | Status: AC
  Administered 2013-09-03: 10 meq via INTRAVENOUS

## 2013-09-03 MED ORDER — LACTATED RINGERS IV SOLN
INTRAVENOUS | Status: DC | PRN
  Administered 2013-09-03 (×2): via INTRAVENOUS

## 2013-09-03 MED ORDER — MIDAZOLAM HCL 2 MG/2ML IJ SOLN: 2.0000 mg | INTRAMUSCULAR | Status: DC | PRN

## 2013-09-03 MED ORDER — ZOLPIDEM TARTRATE 5 MG PO TABS: 10.0000 mg | ORAL_TABLET | Freq: Every evening | ORAL | Status: DC | PRN

## 2013-09-03 MED ORDER — ONDANSETRON HCL 4 MG/2ML IJ SOLN: 4.0000 mg | Freq: Four times a day (QID) | INTRAMUSCULAR | Status: DC | PRN

## 2013-09-03 MED ORDER — PROTAMINE SULFATE 10 MG/ML IV SOLN
INTRAVENOUS | Status: DC | PRN
  Administered 2013-09-03: 30 mg via INTRAVENOUS
  Administered 2013-09-03: 70 mg via INTRAVENOUS
  Administered 2013-09-03 (×2): 100 mg via INTRAVENOUS

## 2013-09-03 MED ORDER — HEPARIN SODIUM (PORCINE) 1000 UNIT/ML IJ SOLN
INTRAMUSCULAR | Status: DC | PRN
  Administered 2013-09-03: 5000 [IU] via INTRAVENOUS
  Administered 2013-09-03: 30000 [IU] via INTRAVENOUS

## 2013-09-03 MED ORDER — HEMOSTATIC AGENTS (NO CHARGE) OPTIME
TOPICAL | Status: DC | PRN
  Administered 2013-09-03: 1 via TOPICAL

## 2013-09-03 MED ORDER — SODIUM CHLORIDE 0.9 % IV SOLN: 250.0000 mL | INTRAVENOUS | Status: DC

## 2013-09-03 MED ORDER — BISACODYL 10 MG RE SUPP: 10.0000 mg | Freq: Every day | RECTAL | Status: DC

## 2013-09-03 MED ORDER — PRAMIPEXOLE DIHYDROCHLORIDE 1 MG PO TABS
1.0000 mg | ORAL_TABLET | Freq: Every day | ORAL | Status: DC
  Administered 2013-09-04: 1 mg via ORAL
  Filled 2013-09-03 (×2): qty 1

## 2013-09-03 MED ORDER — POTASSIUM CHLORIDE 10 MEQ/50ML IV SOLN
10.0000 meq | INTRAVENOUS | Status: AC
  Administered 2013-09-03 (×3): 10 meq via INTRAVENOUS
  Filled 2013-09-03: qty 50

## 2013-09-03 MED ORDER — LACTATED RINGERS IV SOLN: INTRAVENOUS | Status: DC

## 2013-09-03 MED ORDER — OXYCODONE HCL 5 MG PO TABS
5.0000 mg | ORAL_TABLET | ORAL | Status: DC | PRN
  Administered 2013-09-03: 5 mg via ORAL
  Administered 2013-09-04 (×2): 10 mg via ORAL
  Administered 2013-09-04: 5 mg via ORAL
  Filled 2013-09-03: qty 2
  Filled 2013-09-03 (×4): qty 1

## 2013-09-03 MED ORDER — DEXMEDETOMIDINE HCL IN NACL 200 MCG/50ML IV SOLN
0.1000 ug/kg/h | INTRAVENOUS | Status: DC
  Filled 2013-09-03: qty 50

## 2013-09-03 MED ORDER — HYDROMORPHONE HCL PF 1 MG/ML IJ SOLN: 0.2500 mg | INTRAMUSCULAR | Status: DC | PRN

## 2013-09-03 MED ORDER — ACETAMINOPHEN 650 MG RE SUPP
650.0000 mg | Freq: Once | RECTAL | Status: AC
  Administered 2013-09-03: 650 mg via RECTAL
  Filled 2013-09-03: qty 1

## 2013-09-03 MED ORDER — PHENYLEPHRINE HCL 10 MG/ML IJ SOLN
10.0000 mg | INTRAVENOUS | Status: DC | PRN
  Administered 2013-09-03: 25 ug/min via INTRAVENOUS

## 2013-09-03 MED ORDER — LIDOCAINE HCL (CARDIAC) 20 MG/ML IV SOLN
INTRAVENOUS | Status: DC | PRN
  Administered 2013-09-03: 40 mg via INTRAVENOUS

## 2013-09-03 MED ORDER — VECURONIUM BROMIDE 10 MG IV SOLR
INTRAVENOUS | Status: DC | PRN
  Administered 2013-09-03 (×4): 5 mg via INTRAVENOUS

## 2013-09-03 MED ORDER — ONDANSETRON HCL 4 MG/2ML IJ SOLN: 4.0000 mg | Freq: Once | INTRAMUSCULAR | Status: AC | PRN

## 2013-09-03 MED ORDER — MORPHINE SULFATE 2 MG/ML IJ SOLN
2.0000 mg | INTRAMUSCULAR | Status: DC | PRN
  Administered 2013-09-04 – 2013-09-05 (×5): 2 mg via INTRAVENOUS
  Filled 2013-09-03 (×6): qty 1

## 2013-09-03 MED ORDER — ZOLPIDEM TARTRATE 5 MG PO TABS
5.0000 mg | ORAL_TABLET | Freq: Every evening | ORAL | Status: DC | PRN
  Administered 2013-09-04: 5 mg via ORAL
  Filled 2013-09-03: qty 1

## 2013-09-03 MED ORDER — PROPOFOL 10 MG/ML IV BOLUS
INTRAVENOUS | Status: DC | PRN
Start: 2013-09-03 — End: 2013-09-03
  Administered 2013-09-03: 70 mg via INTRAVENOUS

## 2013-09-03 MED ORDER — SODIUM CHLORIDE 0.9 % IV SOLN
INTRAVENOUS | Status: DC | PRN
  Administered 2013-09-03: 12:00:00 via INTRAVENOUS

## 2013-09-03 MED ORDER — FENTANYL CITRATE 0.05 MG/ML IJ SOLN
INTRAMUSCULAR | Status: DC | PRN
  Administered 2013-09-03: 100 ug via INTRAVENOUS
  Administered 2013-09-03: 150 ug via INTRAVENOUS
  Administered 2013-09-03 (×3): 100 ug via INTRAVENOUS
  Administered 2013-09-03: 150 ug via INTRAVENOUS
  Administered 2013-09-03: 100 ug via INTRAVENOUS
  Administered 2013-09-03: 250 ug via INTRAVENOUS
  Administered 2013-09-03 (×2): 100 ug via INTRAVENOUS
  Administered 2013-09-03: 250 ug via INTRAVENOUS

## 2013-09-03 MED ORDER — PANTOPRAZOLE SODIUM 40 MG PO TBEC
40.0000 mg | DELAYED_RELEASE_TABLET | Freq: Every day | ORAL | Status: DC
  Administered 2013-09-05 – 2013-09-08 (×4): 40 mg via ORAL
  Filled 2013-09-03 (×4): qty 1

## 2013-09-03 MED ORDER — SIMVASTATIN 40 MG PO TABS: 40.0000 mg | ORAL_TABLET | Freq: Every day | ORAL | Status: DC

## 2013-09-03 MED ORDER — METOCLOPRAMIDE HCL 5 MG/ML IJ SOLN
10.0000 mg | Freq: Four times a day (QID) | INTRAMUSCULAR | Status: AC
  Administered 2013-09-03 – 2013-09-04 (×3): 10 mg via INTRAVENOUS
  Filled 2013-09-03 (×3): qty 2

## 2013-09-03 MED ORDER — MORPHINE SULFATE 2 MG/ML IJ SOLN
1.0000 mg | INTRAMUSCULAR | Status: AC | PRN
  Administered 2013-09-03: 1 mg via INTRAVENOUS
  Administered 2013-09-03: 2 mg via INTRAVENOUS
  Filled 2013-09-03: qty 1

## 2013-09-03 MED ORDER — METOPROLOL TARTRATE 1 MG/ML IV SOLN
2.5000 mg | INTRAVENOUS | Status: DC | PRN
  Administered 2013-09-05: 5 mg via INTRAVENOUS

## 2013-09-03 MED ORDER — BISACODYL 5 MG PO TBEC
10.0000 mg | DELAYED_RELEASE_TABLET | Freq: Every day | ORAL | Status: DC
  Administered 2013-09-04 – 2013-09-08 (×5): 10 mg via ORAL
  Filled 2013-09-03 (×5): qty 2

## 2013-09-03 MED ORDER — ROCURONIUM BROMIDE 100 MG/10ML IV SOLN
INTRAVENOUS | Status: DC | PRN
  Administered 2013-09-03: 50 mg via INTRAVENOUS

## 2013-09-03 MED ORDER — SODIUM CHLORIDE 0.9 % IV SOLN
20.0000 ug | Freq: Once | INTRAVENOUS | Status: AC
  Administered 2013-09-03: 20 ug via INTRAVENOUS
  Filled 2013-09-03: qty 5

## 2013-09-03 MED ORDER — PANTOPRAZOLE SODIUM 40 MG PO TBEC: 80.0000 mg | DELAYED_RELEASE_TABLET | Freq: Every day | ORAL | Status: DC

## 2013-09-03 MED ORDER — TESTOSTERONE CYPIONATE 200 MG/ML IM SOLN: 300.0000 mg | INTRAMUSCULAR | Status: DC

## 2013-09-03 MED ORDER — PRAVASTATIN SODIUM 40 MG PO TABS
80.0000 mg | ORAL_TABLET | Freq: Every day | ORAL | Status: DC
  Administered 2013-09-04 – 2013-09-07 (×4): 80 mg via ORAL
  Filled 2013-09-03 (×6): qty 2

## 2013-09-03 MED ORDER — ACETAMINOPHEN 160 MG/5ML PO SOLN: 650.0000 mg | Freq: Once | ORAL | Status: AC

## 2013-09-03 MED ORDER — ARTIFICIAL TEARS OP OINT
TOPICAL_OINTMENT | OPHTHALMIC | Status: DC | PRN
  Administered 2013-09-03: 1 via OPHTHALMIC

## 2013-09-03 MED ORDER — INSULIN ASPART 100 UNIT/ML ~~LOC~~ SOLN
0.0000 [IU] | SUBCUTANEOUS | Status: DC
  Administered 2013-09-03 – 2013-09-04 (×4): 2 [IU] via SUBCUTANEOUS

## 2013-09-03 MED ORDER — OXYBUTYNIN CHLORIDE ER 10 MG PO TB24
10.0000 mg | ORAL_TABLET | Freq: Every day | ORAL | Status: DC
  Administered 2013-09-04 – 2013-09-08 (×5): 10 mg via ORAL
  Filled 2013-09-03 (×5): qty 1

## 2013-09-03 MED ORDER — SODIUM CHLORIDE 0.9 % IV SOLN
INTRAVENOUS | Status: DC
  Filled 2013-09-03: qty 1

## 2013-09-03 MED ORDER — FAMOTIDINE IN NACL 20-0.9 MG/50ML-% IV SOLN
20.0000 mg | Freq: Two times a day (BID) | INTRAVENOUS | Status: AC
  Administered 2013-09-03: 20 mg via INTRAVENOUS
  Filled 2013-09-03: qty 50

## 2013-09-03 MED ORDER — 0.9 % SODIUM CHLORIDE (POUR BTL) OPTIME
TOPICAL | Status: DC | PRN
  Administered 2013-09-03: 1000 mL

## 2013-09-03 MED ORDER — DEXTROSE 5 % IV SOLN
1.5000 g | Freq: Two times a day (BID) | INTRAVENOUS | Status: AC
  Administered 2013-09-03 – 2013-09-05 (×4): 1.5 g via INTRAVENOUS
  Filled 2013-09-03 (×4): qty 1.5

## 2013-09-03 MED ORDER — PHENYLEPHRINE HCL 10 MG/ML IJ SOLN
0.0000 ug/min | INTRAVENOUS | Status: DC
  Administered 2013-09-04: 10 ug/min via INTRAVENOUS
  Filled 2013-09-03 (×2): qty 2

## 2013-09-03 MED ORDER — SODIUM CHLORIDE 0.9 % IJ SOLN: 3.0000 mL | INTRAMUSCULAR | Status: DC | PRN

## 2013-09-03 MED ORDER — NITROGLYCERIN IN D5W 200-5 MCG/ML-% IV SOLN: 0.0000 ug/min | INTRAVENOUS | Status: DC

## 2013-09-03 MED ORDER — METOPROLOL TARTRATE 12.5 MG HALF TABLET
12.5000 mg | ORAL_TABLET | Freq: Two times a day (BID) | ORAL | Status: DC
  Administered 2013-09-04 – 2013-09-08 (×9): 12.5 mg via ORAL
  Filled 2013-09-03 (×11): qty 1

## 2013-09-03 MED ORDER — ACETAMINOPHEN 500 MG PO TABS
1000.0000 mg | ORAL_TABLET | Freq: Four times a day (QID) | ORAL | Status: DC
  Administered 2013-09-03 – 2013-09-08 (×17): 1000 mg via ORAL
  Filled 2013-09-03 (×20): qty 2

## 2013-09-03 MED ORDER — VANCOMYCIN HCL IN DEXTROSE 1-5 GM/200ML-% IV SOLN
1000.0000 mg | Freq: Once | INTRAVENOUS | Status: AC
  Administered 2013-09-03: 1000 mg via INTRAVENOUS
  Filled 2013-09-03: qty 200

## 2013-09-03 MED ORDER — SODIUM CHLORIDE 0.9 % IV SOLN
INTRAVENOUS | Status: DC
  Administered 2013-09-03: 13:00:00 via INTRAVENOUS

## 2013-09-03 MED ORDER — LACTATED RINGERS IV SOLN
INTRAVENOUS | Status: DC | PRN
  Administered 2013-09-03 (×2): via INTRAVENOUS

## 2013-09-03 MED ORDER — METOPROLOL TARTRATE 25 MG/10 ML ORAL SUSPENSION
12.5000 mg | Freq: Two times a day (BID) | ORAL | Status: DC
  Filled 2013-09-03 (×5): qty 5

## 2013-09-03 MED ORDER — CALCIUM CHLORIDE 10 % IV SOLN
INTRAVENOUS | Status: DC | PRN
  Administered 2013-09-03: 200 mg via INTRAVENOUS

## 2013-09-03 MED ORDER — INSULIN REGULAR BOLUS VIA INFUSION
0.0000 [IU] | Freq: Three times a day (TID) | INTRAVENOUS | Status: DC
  Filled 2013-09-03: qty 10

## 2013-09-03 MED ORDER — MAGNESIUM SULFATE 40 MG/ML IJ SOLN
4.0000 g | Freq: Once | INTRAMUSCULAR | Status: AC
  Administered 2013-09-03: 4 g via INTRAVENOUS
  Filled 2013-09-03: qty 100

## 2013-09-03 MED ORDER — ALBUMIN HUMAN 5 % IV SOLN
250.0000 mL | INTRAVENOUS | Status: AC | PRN
  Administered 2013-09-03: 250 mL via INTRAVENOUS
  Filled 2013-09-03: qty 250

## 2013-09-03 MED ORDER — SODIUM CHLORIDE 0.45 % IV SOLN: INTRAVENOUS | Status: DC

## 2013-09-03 MED ORDER — MIDAZOLAM HCL 5 MG/5ML IJ SOLN
INTRAMUSCULAR | Status: DC | PRN
  Administered 2013-09-03 (×5): 2 mg via INTRAVENOUS

## 2013-09-03 MED ORDER — SODIUM CHLORIDE 0.9 % IJ SOLN
3.0000 mL | Freq: Two times a day (BID) | INTRAMUSCULAR | Status: DC
  Administered 2013-09-04 – 2013-09-05 (×2): 3 mL via INTRAVENOUS

## 2013-09-03 MED ORDER — LACTATED RINGERS IV SOLN: 500.0000 mL | Freq: Once | INTRAVENOUS | Status: AC | PRN

## 2013-09-03 MED ORDER — ASPIRIN EC 325 MG PO TBEC
325.0000 mg | DELAYED_RELEASE_TABLET | Freq: Every day | ORAL | Status: DC
  Administered 2013-09-04 – 2013-09-08 (×5): 325 mg via ORAL
  Filled 2013-09-03 (×5): qty 1

## 2013-09-03 MED ORDER — ACETAMINOPHEN 160 MG/5ML PO SOLN: 1000.0000 mg | Freq: Four times a day (QID) | ORAL | Status: DC

## 2013-09-03 MED ORDER — ALBUMIN HUMAN 5 % IV SOLN
INTRAVENOUS | Status: DC | PRN
  Administered 2013-09-03 (×2): via INTRAVENOUS

## 2013-09-03 MED ORDER — DOPAMINE-DEXTROSE 3.2-5 MG/ML-% IV SOLN
3.0000 ug/kg/min | INTRAVENOUS | Status: DC
  Administered 2013-09-03: 3 ug/kg/min via INTRAVENOUS

## 2013-09-03 SURGICAL SUPPLY — 106 items
ADAPTER CARDIO PERF ANTE/RETRO (ADAPTER) ×4 IMPLANT
ATTRACTOMAT 16X20 MAGNETIC DRP (DRAPES) ×4 IMPLANT
BAG DECANTER FOR FLEXI CONT (MISCELLANEOUS) ×4 IMPLANT
BANDAGE ELASTIC 4 VELCRO ST LF (GAUZE/BANDAGES/DRESSINGS) ×4 IMPLANT
BANDAGE ELASTIC 6 VELCRO ST LF (GAUZE/BANDAGES/DRESSINGS) ×4 IMPLANT
BANDAGE GAUZE ELAST BULKY 4 IN (GAUZE/BANDAGES/DRESSINGS) ×4 IMPLANT
BASKET HEART  (ORDER IN 25'S) (MISCELLANEOUS) ×1
BASKET HEART (ORDER IN 25'S) (MISCELLANEOUS) ×1
BASKET HEART (ORDER IN 25S) (MISCELLANEOUS) ×2 IMPLANT
BLADE STERNUM SYSTEM 6 (BLADE) ×4 IMPLANT
BLADE SURG 11 STRL SS (BLADE) ×4 IMPLANT
BLADE SURG 12 STRL SS (BLADE) ×4 IMPLANT
BLADE SURG ROTATE 9660 (MISCELLANEOUS) IMPLANT
BNDG GAUZE ELAST 4 BULKY (GAUZE/BANDAGES/DRESSINGS) ×4 IMPLANT
CANISTER SUCTION 2500CC (MISCELLANEOUS) ×4 IMPLANT
CANNULA GUNDRY RCSP 15FR (MISCELLANEOUS) ×4 IMPLANT
CANNULA VENOUS LOW PROF 32X40 (CANNULA) IMPLANT
CARDIAC SUCTION (MISCELLANEOUS) ×4 IMPLANT
CATH CPB KIT VANTRIGT (MISCELLANEOUS) ×4 IMPLANT
CATH ROBINSON RED A/P 18FR (CATHETERS) ×12 IMPLANT
CATH THORACIC 36FR RT ANG (CATHETERS) ×4 IMPLANT
CLIP FOGARTY SPRING 6M (CLIP) ×4 IMPLANT
CLIP TI WIDE RED SMALL 24 (CLIP) IMPLANT
COVER SURGICAL LIGHT HANDLE (MISCELLANEOUS) ×4 IMPLANT
CRADLE DONUT ADULT HEAD (MISCELLANEOUS) ×4 IMPLANT
DRAIN CHANNEL 32F RND 10.7 FF (WOUND CARE) ×4 IMPLANT
DRAPE CARDIOVASCULAR INCISE (DRAPES) ×2
DRAPE SLUSH/WARMER DISC (DRAPES) ×4 IMPLANT
DRAPE SRG 135X102X78XABS (DRAPES) ×2 IMPLANT
DRSG AQUACEL AG ADV 3.5X14 (GAUZE/BANDAGES/DRESSINGS) ×4 IMPLANT
ELECT BLADE 4.0 EZ CLEAN MEGAD (MISCELLANEOUS) ×4
ELECT BLADE 6.5 EXT (BLADE) ×4 IMPLANT
ELECT CAUTERY BLADE 6.4 (BLADE) ×4 IMPLANT
ELECT REM PT RETURN 9FT ADLT (ELECTROSURGICAL) ×8
ELECTRODE BLDE 4.0 EZ CLN MEGD (MISCELLANEOUS) ×2 IMPLANT
ELECTRODE REM PT RTRN 9FT ADLT (ELECTROSURGICAL) ×4 IMPLANT
GLOVE BIO SURGEON STRL SZ 6 (GLOVE) ×8 IMPLANT
GLOVE BIO SURGEON STRL SZ 6.5 (GLOVE) ×15 IMPLANT
GLOVE BIO SURGEON STRL SZ7 (GLOVE) ×8 IMPLANT
GLOVE BIO SURGEON STRL SZ7.5 (GLOVE) ×12 IMPLANT
GLOVE BIO SURGEONS STRL SZ 6.5 (GLOVE) ×5
GLOVE BIOGEL PI IND STRL 6.5 (GLOVE) ×8 IMPLANT
GLOVE BIOGEL PI IND STRL 7.0 (GLOVE) ×2 IMPLANT
GLOVE BIOGEL PI INDICATOR 6.5 (GLOVE) ×8
GLOVE BIOGEL PI INDICATOR 7.0 (GLOVE) ×2
GOWN STRL NON-REIN LRG LVL3 (GOWN DISPOSABLE) ×32 IMPLANT
HEMOSTAT POWDER SURGIFOAM 1G (HEMOSTASIS) ×12 IMPLANT
HEMOSTAT SURGICEL 2X14 (HEMOSTASIS) ×4 IMPLANT
INSERT FOGARTY XLG (MISCELLANEOUS) IMPLANT
KIT BASIN OR (CUSTOM PROCEDURE TRAY) ×4 IMPLANT
KIT ROOM TURNOVER OR (KITS) ×4 IMPLANT
KIT SUCTION CATH 14FR (SUCTIONS) ×4 IMPLANT
KIT VASOVIEW W/TROCAR VH 2000 (KITS) ×4 IMPLANT
LEAD PACING MYOCARDI (MISCELLANEOUS) ×4 IMPLANT
MARKER GRAFT CORONARY BYPASS (MISCELLANEOUS) ×12 IMPLANT
NS IRRIG 1000ML POUR BTL (IV SOLUTION) ×20 IMPLANT
PACK OPEN HEART (CUSTOM PROCEDURE TRAY) ×4 IMPLANT
PAD ARMBOARD 7.5X6 YLW CONV (MISCELLANEOUS) ×8 IMPLANT
PAD ELECT DEFIB RADIOL ZOLL (MISCELLANEOUS) ×4 IMPLANT
PENCIL BUTTON HOLSTER BLD 10FT (ELECTRODE) ×4 IMPLANT
PUNCH AORTIC ROTATE 4.0MM (MISCELLANEOUS) IMPLANT
PUNCH AORTIC ROTATE 4.5MM 8IN (MISCELLANEOUS) ×4 IMPLANT
PUNCH AORTIC ROTATE 5MM 8IN (MISCELLANEOUS) IMPLANT
SET CARDIOPLEGIA MPS 5001102 (MISCELLANEOUS) ×4 IMPLANT
SOLUTION ANTI FOG 6CC (MISCELLANEOUS) ×4 IMPLANT
SPONGE GAUZE 4X4 12PLY (GAUZE/BANDAGES/DRESSINGS) ×8 IMPLANT
SPONGE GAUZE 4X4 12PLY STER LF (GAUZE/BANDAGES/DRESSINGS) ×8 IMPLANT
SPONGE LAP 18X18 X RAY DECT (DISPOSABLE) ×4 IMPLANT
SPONGE LAP 4X18 X RAY DECT (DISPOSABLE) ×4 IMPLANT
SURGIFLO W/THROMBIN 8M KIT (HEMOSTASIS) ×4 IMPLANT
SUT BONE WAX W31G (SUTURE) ×4 IMPLANT
SUT MNCRL AB 4-0 PS2 18 (SUTURE) ×8 IMPLANT
SUT PROLENE 3 0 SH DA (SUTURE) IMPLANT
SUT PROLENE 3 0 SH1 36 (SUTURE) IMPLANT
SUT PROLENE 4 0 RB 1 (SUTURE) ×2
SUT PROLENE 4 0 SH DA (SUTURE) ×8 IMPLANT
SUT PROLENE 4-0 RB1 .5 CRCL 36 (SUTURE) ×2 IMPLANT
SUT PROLENE 5 0 C 1 36 (SUTURE) IMPLANT
SUT PROLENE 6 0 C 1 30 (SUTURE) ×4 IMPLANT
SUT PROLENE 6 0 CC (SUTURE) ×20 IMPLANT
SUT PROLENE 7 0 BV 1 (SUTURE) ×4 IMPLANT
SUT PROLENE 8 0 BV175 6 (SUTURE) ×8 IMPLANT
SUT PROLENE BLUE 7 0 (SUTURE) ×4 IMPLANT
SUT SILK  1 MH (SUTURE)
SUT SILK 1 MH (SUTURE) IMPLANT
SUT SILK 2 0 SH CR/8 (SUTURE) IMPLANT
SUT SILK 2 0SH CR/8 30 (SUTURE) ×4 IMPLANT
SUT SILK 3 0 SH CR/8 (SUTURE) IMPLANT
SUT STEEL 6MS V (SUTURE) ×8 IMPLANT
SUT STEEL SZ 6 DBL 3X14 BALL (SUTURE) ×4 IMPLANT
SUT VIC AB 1 CTX 36 (SUTURE) ×6
SUT VIC AB 1 CTX36XBRD ANBCTR (SUTURE) ×6 IMPLANT
SUT VIC AB 2-0 CT1 27 (SUTURE) ×4
SUT VIC AB 2-0 CT1 TAPERPNT 27 (SUTURE) ×4 IMPLANT
SUT VIC AB 2-0 CTX 27 (SUTURE) IMPLANT
SUT VIC AB 3-0 X1 27 (SUTURE) IMPLANT
SUTURE E-PAK OPEN HEART (SUTURE) ×4 IMPLANT
SYSTEM SAHARA CHEST DRAIN ATS (WOUND CARE) ×4 IMPLANT
TAPE CLOTH SURG 4X10 WHT LF (GAUZE/BANDAGES/DRESSINGS) ×4 IMPLANT
TAPE PAPER 2X10 WHT MICROPORE (GAUZE/BANDAGES/DRESSINGS) ×4 IMPLANT
TOWEL OR 17X24 6PK STRL BLUE (TOWEL DISPOSABLE) ×8 IMPLANT
TOWEL OR 17X26 10 PK STRL BLUE (TOWEL DISPOSABLE) ×8 IMPLANT
TRAY FOLEY IC TEMP SENS 14FR (CATHETERS) ×4 IMPLANT
TUBING INSUFFLATION 10FT LAP (TUBING) IMPLANT
UNDERPAD 30X30 INCONTINENT (UNDERPADS AND DIAPERS) ×4 IMPLANT
WATER STERILE IRR 1000ML POUR (IV SOLUTION) ×8 IMPLANT

## 2013-09-03 NOTE — Anesthesia Preprocedure Evaluation (Signed)
Anesthesia Evaluation  Patient identified by MRN, date of birth, ID band Patient awake    Reviewed: Allergy & Precautions, H&P , NPO status , Patient's Chart, lab work & pertinent test results  Airway       Dental   Pulmonary former smoker,          Cardiovascular hypertension, + CAD     Neuro/Psych  Neuromuscular disease    GI/Hepatic GERD-  ,  Endo/Other    Renal/GU      Musculoskeletal   Abdominal   Peds  Hematology   Anesthesia Other Findings   Reproductive/Obstetrics                           Anesthesia Physical Anesthesia Plan  ASA: III  Anesthesia Plan: General   Post-op Pain Management:    Induction: Intravenous  Airway Management Planned: Oral ETT  Additional Equipment: Arterial line, CVP, PA Cath and TEE  Intra-op Plan:   Post-operative Plan: Post-operative intubation/ventilation  Informed Consent: I have reviewed the patients History and Physical, chart, labs and discussed the procedure including the risks, benefits and alternatives for the proposed anesthesia with the patient or authorized representative who has indicated his/her understanding and acceptance.     Plan Discussed with:   Anesthesia Plan Comments:         Anesthesia Quick Evaluation

## 2013-09-03 NOTE — Anesthesia Procedure Notes (Signed)
Procedure Name: Intubation Date/Time: 09/03/2013 7:30 AM Performed by: [place] Pink Pre-anesthesia Checklist: Patient identified, Timeout performed, Emergency Drugs available, Suction available and Patient being monitored Patient Re-evaluated:Patient Re-evaluated prior to inductionOxygen Delivery Method: Circle system utilized Preoxygenation: Pre-oxygenation with 100% oxygen Intubation Type: IV induction Ventilation: Mask ventilation without difficulty Laryngoscope Size: Mac and 3 Grade View: Grade I Tube type: Oral Tube size: 8.0 mm Number of attempts: 1 Airway Equipment and Method: Stylet Placement Confirmation: ETT inserted through vocal cords under direct vision,  positive ETCO2 and breath sounds checked- equal and bilateral Secured at: 22 cm Tube secured with: Tape Dental Injury: Teeth and Oropharynx as per pre-operative assessment

## 2013-09-03 NOTE — OR Nursing (Signed)
2nd call to SICU 1250.

## 2013-09-03 NOTE — Brief Op Note (Signed)
09/03/2013  11:31 AM  PATIENT:  [name]  80 y.o. male  PRE-OPERATIVE DIAGNOSIS:  CAD  POST-OPERATIVE DIAGNOSIS:  coronay artery disease  PROCEDURE:  Procedure(s):  CORONARY ARTERY BYPASS GRAFTING x 4 -LIMA to LAD -SVG to DIAGONAL -SEQUENTIAL SVG to OM1 and OM2  ENDOSCOPIC SAPHENOUS VEIN HARVEST RIGHT LEG AND LEFT PARTIAL THIGH  INTRAOPERATIVE TRANSESOPHAGEAL ECHOCARDIOGRAM (N/A)  SURGEON:  Surgeon(s) and Role:    * Ivin Poot, MD - Primary  PHYSICIAN ASSISTANT: Ellwood Handler PA-C  ANESTHESIA:   general  EBL:  Total I/O In: 1000 [I.V.:1000] Out: 375 [Urine:375]  BLOOD ADMINISTERED: CELLSAVER, 1 FFP and 1 PLTS  DRAINS: Left Pleural Chest tubes, Mediastinal chest drains   LOCAL MEDICATIONS USED:  NONE  SPECIMEN:  No Specimen  DISPOSITION OF SPECIMEN:  N/A  COUNTS:  YES  TOURNIQUET:  * No tourniquets in log *  DICTATION: .Dragon Dictation  PLAN OF CARE: Admit to inpatient   PATIENT DISPOSITION:  ICU - intubated and hemodynamically stable.   Delay start of Pharmacological VTE agent (>24hrs) due to surgical blood loss or risk of bleeding: yes

## 2013-09-03 NOTE — Progress Notes (Signed)
The patient was examined and preop studies reviewed. There has been no change from the prior exam and the patient is ready for surgery.   Plan CABG on [name] today

## 2013-09-03 NOTE — Progress Notes (Signed)
Pt. Has spotty red,raised rash on both legs. Pt. States that he just noticed the rash yesterday it was itching yesterday but not this morning.

## 2013-09-03 NOTE — Anesthesia Postprocedure Evaluation (Signed)
  Anesthesia Post-op Note  Patient: [name]  Procedure(s) Performed: Procedure(s): CORONARY ARTERY BYPASS GRAFTING times four using left internal mammary and bilateral saphenous vein. (N/A) INTRAOPERATIVE TRANSESOPHAGEAL ECHOCARDIOGRAM (N/A)  Patient Location: SICU  Anesthesia Type:General  Level of Consciousness: sedated and Patient remains intubated per anesthesia plan  Airway and Oxygen Therapy: Patient remains intubated per anesthesia plan and Patient placed on Ventilator (see vital sign flow sheet for setting)  Post-op Pain: none  Post-op Assessment: Post-op Vital signs reviewed, Patient's Cardiovascular Status Stable, Respiratory Function Stable, Patent Airway, No signs of Nausea or vomiting and Pain level controlled  Post-op Vital Signs: stable  Complications: No apparent anesthesia complications

## 2013-09-03 NOTE — Progress Notes (Signed)
Called MD with CXR and ABG results.  Orders per MD:  Decrease PEEP to 5 Monitor for bleeding for another hour Wait one hour before weaning patient  Patient is arousble, can open eyes, patient ca squeeze hands bilaterally, can wiggle toes bilaterally, and can stick his tongue to command.  Drips: Neo @ 35, Dop @ 3, Precedex @ 0.2  Will monitor closely

## 2013-09-03 NOTE — Procedures (Signed)
Extubation Procedure Note  Patient Details:   Name: [name] DOB: Dec 13, 1926 MRN: [id number]   Airway Documentation: Patient extubated at 2130 to [facility]. Patient tolerating well at this time. No distress or complications noted at this time. Vitals within normal limits.     Evaluation  O2 sats: stable throughout Complications: No apparent complications Patient did tolerate procedure well. Bilateral Breath Sounds: Clear;Diminished   Yes  Demetria Pore 09/03/2013, 9:52 PM

## 2013-09-03 NOTE — Transfer of Care (Signed)
Immediate Anesthesia Transfer of Care Note  Patient: [name]  Procedure(s) Performed: Procedure(s): CORONARY ARTERY BYPASS GRAFTING times four using left internal mammary and bilateral saphenous vein. (N/A) INTRAOPERATIVE TRANSESOPHAGEAL ECHOCARDIOGRAM (N/A)  Patient Location: ICU  Anesthesia Type:General  Level of Consciousness: sedated and Patient remains intubated per anesthesia plan  Airway & Oxygen Therapy: Patient remains intubated per anesthesia plan and Patient placed on Ventilator (see vital sign flow sheet for setting)  Post-op Assessment: Post -op Vital signs reviewed and stable and report given to ICU RN  Post vital signs: Reviewed and stable  Complications: No apparent anesthesia complications

## 2013-09-03 NOTE — Progress Notes (Signed)
Patient's CT output past hour have been 170cc, paged MD.  Orders received:  Changed PM from AAI @ 80 to AAI @ 90 Increased PEEP to 10 1 unit FFP 1 dose of 63mcg DDAVP (Pb)  Will hold off weaning patient on vent for now, patient is still on 0.53mcg of Precedex, still not awake.  Will continue to monitor.

## 2013-09-03 NOTE — Progress Notes (Signed)
CT surgery p.m. Rounds  Patient waking up on ventilator but not yet extubated  Hemodynamic stable Chest tube output improved after administration of desmopressin 6 hour labs satisfactory Continue renal dose dopamine for renal function

## 2013-09-03 NOTE — Progress Notes (Signed)
MD called, PEEP decreased to 7, per MD will obtain STAT CXR and will get ABG as well, last hour CT output was 50cc. FFP is currently infusing, NEO at 50 mcg, DOP at 3 mcg, Precedex 0.2 mcg, patient still not awake.   Will monitor closely

## 2013-09-03 NOTE — Progress Notes (Signed)
Upon arrival to unit, noticed that precedex drip was not on, restarted drip at 0.5 mcg and then proceeded to titrate it down to 0.4, currently it is at 0.2 mcg. Will monitor

## 2013-09-03 NOTE — Preoperative (Signed)
Beta Blockers   Reason not to administer Beta Blockers:Not Applicable 

## 2013-09-04 ENCOUNTER — Inpatient Hospital Stay (HOSPITAL_COMMUNITY): Payer: Medicare PPO

## 2013-09-04 ENCOUNTER — Encounter (HOSPITAL_COMMUNITY): Payer: Self-pay | Admitting: Cardiothoracic Surgery

## 2013-09-04 LAB — PREPARE PLATELET PHERESIS: Unit division: 0

## 2013-09-04 LAB — POCT I-STAT, CHEM 8
BUN: 17 mg/dL (ref 6–23)
Calcium, Ion: 1.25 mmol/L (ref 1.13–1.30)
Chloride: 95 mEq/L — ABNORMAL LOW (ref 96–112)
Creatinine, Ser: 1.2 mg/dL (ref 0.50–1.35)
Glucose, Bld: 102 mg/dL — ABNORMAL HIGH (ref 70–99)
HCT: 33 % — ABNORMAL LOW (ref 39.0–52.0)
Hemoglobin: 11.2 g/dL — ABNORMAL LOW (ref 13.0–17.0)
Potassium: 4.5 mEq/L (ref 3.7–5.3)
Sodium: 134 mEq/L — ABNORMAL LOW (ref 137–147)
TCO2: 27 mmol/L (ref 0–100)

## 2013-09-04 LAB — MAGNESIUM
MAGNESIUM: 2 mg/dL (ref 1.5–2.5)
Magnesium: 2.2 mg/dL (ref 1.5–2.5)

## 2013-09-04 LAB — GLUCOSE, CAPILLARY
Glucose-Capillary: 100 mg/dL — ABNORMAL HIGH (ref 70–99)
Glucose-Capillary: 100 mg/dL — ABNORMAL HIGH (ref 70–99)
Glucose-Capillary: 119 mg/dL — ABNORMAL HIGH (ref 70–99)
Glucose-Capillary: 122 mg/dL — ABNORMAL HIGH (ref 70–99)
Glucose-Capillary: 134 mg/dL — ABNORMAL HIGH (ref 70–99)
Glucose-Capillary: 147 mg/dL — ABNORMAL HIGH (ref 70–99)
Glucose-Capillary: 159 mg/dL — ABNORMAL HIGH (ref 70–99)

## 2013-09-04 LAB — CBC
HCT: 32.2 % — ABNORMAL LOW (ref 39.0–52.0)
HEMATOCRIT: 32 % — AB (ref 39.0–52.0)
Hemoglobin: 11 g/dL — ABNORMAL LOW (ref 13.0–17.0)
Hemoglobin: 10.9 g/dL — ABNORMAL LOW (ref 13.0–17.0)
MCH: 32.2 pg (ref 26.0–34.0)
MCH: 32.5 pg (ref 26.0–34.0)
MCHC: 34.2 g/dL (ref 30.0–36.0)
MCHC: 34.1 g/dL (ref 30.0–36.0)
MCV: 94.2 fL (ref 78.0–100.0)
MCV: 95.5 fL (ref 78.0–100.0)
PLATELETS: 162 10*3/uL (ref 150–400)
Platelets: 167 10*3/uL (ref 150–400)
RBC: 3.42 MIL/uL — ABNORMAL LOW (ref 4.22–5.81)
RBC: 3.35 MIL/uL — AB (ref 4.22–5.81)
RDW: 13.9 % (ref 11.5–15.5)
RDW: 14 % (ref 11.5–15.5)
WBC: 10 10*3/uL (ref 4.0–10.5)
WBC: 10.8 10*3/uL — ABNORMAL HIGH (ref 4.0–10.5)

## 2013-09-04 LAB — PREPARE FRESH FROZEN PLASMA
Unit division: 0
Unit division: 0

## 2013-09-04 LAB — BASIC METABOLIC PANEL
BUN: 14 mg/dL (ref 6–23)
CALCIUM: 8.2 mg/dL — AB (ref 8.4–10.5)
CO2: 24 mEq/L (ref 19–32)
Chloride: 102 mEq/L (ref 96–112)
Creatinine, Ser: 0.92 mg/dL (ref 0.50–1.35)
GFR calc Af Amer: 87 mL/min — ABNORMAL LOW (ref >90)
GFR calc non Af Amer: 75 mL/min — ABNORMAL LOW (ref >90)
Glucose, Bld: 139 mg/dL — ABNORMAL HIGH (ref 70–99)
Potassium: 4.5 mEq/L (ref 3.7–5.3)
Sodium: 136 mEq/L — ABNORMAL LOW (ref 137–147)

## 2013-09-04 LAB — CREATININE, SERUM
Creatinine, Ser: 1.05 mg/dL (ref 0.50–1.35)
GFR calc non Af Amer: 63 mL/min — ABNORMAL LOW (ref >90)
GFR, EST AFRICAN AMERICAN: 73 mL/min — AB (ref >90)

## 2013-09-04 LAB — TYPE AND SCREEN
ABO/RH(D): AB NEG
Antibody Screen: NEGATIVE

## 2013-09-04 MED ORDER — AMIODARONE HCL IN DEXTROSE 360-4.14 MG/200ML-% IV SOLN
INTRAVENOUS | Status: AC
  Administered 2013-09-05: 30 mg/h via INTRAVENOUS
  Filled 2013-09-04: qty 200

## 2013-09-04 MED ORDER — AMIODARONE HCL IN DEXTROSE 360-4.14 MG/200ML-% IV SOLN
30.0000 mg/h | INTRAVENOUS | Status: DC
  Administered 2013-09-05 (×2): 30 mg/h via INTRAVENOUS
  Filled 2013-09-04 (×3): qty 200

## 2013-09-04 MED ORDER — INSULIN DETEMIR 100 UNIT/ML ~~LOC~~ SOLN
6.0000 [IU] | Freq: Every day | SUBCUTANEOUS | Status: DC
Start: 2013-09-04 — End: 2013-09-04
  Filled 2013-09-04: qty 0.06

## 2013-09-04 MED ORDER — AMIODARONE LOAD VIA INFUSION
150.0000 mg | Freq: Once | INTRAVENOUS | Status: AC
  Administered 2013-09-05: 150 mg via INTRAVENOUS
  Filled 2013-09-04: qty 83.34

## 2013-09-04 MED ORDER — INSULIN DETEMIR 100 UNIT/ML ~~LOC~~ SOLN
6.0000 [IU] | Freq: Every day | SUBCUTANEOUS | Status: DC
  Filled 2013-09-04: qty 0.06

## 2013-09-04 MED ORDER — FUROSEMIDE 10 MG/ML IJ SOLN
20.0000 mg | Freq: Two times a day (BID) | INTRAMUSCULAR | Status: AC
  Administered 2013-09-04 – 2013-09-05 (×4): 20 mg via INTRAVENOUS
  Filled 2013-09-04 (×5): qty 2

## 2013-09-04 MED ORDER — AMIODARONE HCL IN DEXTROSE 360-4.14 MG/200ML-% IV SOLN
60.0000 mg/h | INTRAVENOUS | Status: AC
  Administered 2013-09-05: 60 mg/h via INTRAVENOUS
  Filled 2013-09-04: qty 200

## 2013-09-04 MED ORDER — INSULIN ASPART 100 UNIT/ML ~~LOC~~ SOLN
0.0000 [IU] | SUBCUTANEOUS | Status: DC
  Administered 2013-09-04: 2 [IU] via SUBCUTANEOUS

## 2013-09-04 MED ORDER — POTASSIUM CHLORIDE CRYS ER 20 MEQ PO TBCR
20.0000 meq | EXTENDED_RELEASE_TABLET | Freq: Once | ORAL | Status: AC
  Administered 2013-09-04: 20 meq via ORAL
  Filled 2013-09-04: qty 1

## 2013-09-04 MED ORDER — INSULIN DETEMIR 100 UNIT/ML ~~LOC~~ SOLN
6.0000 [IU] | Freq: Every day | SUBCUTANEOUS | Status: DC
  Administered 2013-09-04: 6 [IU] via SUBCUTANEOUS
  Filled 2013-09-04 (×2): qty 0.06

## 2013-09-04 MED ORDER — PRAMIPEXOLE DIHYDROCHLORIDE 1 MG PO TABS
1.0000 mg | ORAL_TABLET | Freq: Every day | ORAL | Status: DC
  Administered 2013-09-04 – 2013-09-07 (×5): 1 mg via ORAL
  Filled 2013-09-04 (×6): qty 1

## 2013-09-04 MED FILL — Electrolyte-R (PH 7.4) Solution: INTRAVENOUS | Qty: 1000 | Status: AC

## 2013-09-04 MED FILL — Potassium Chloride Inj 2 mEq/ML: INTRAVENOUS | Qty: 20 | Status: AC

## 2013-09-04 MED FILL — Lidocaine HCl IV Inj 20 MG/ML: INTRAVENOUS | Qty: 5 | Status: AC

## 2013-09-04 MED FILL — Sodium Chloride IV Soln 0.9%: INTRAVENOUS | Qty: 2000 | Status: AC

## 2013-09-04 MED FILL — Heparin Sodium (Porcine) Inj 1000 Unit/ML: INTRAMUSCULAR | Qty: 30 | Status: AC

## 2013-09-04 MED FILL — Heparin Sodium (Porcine) Inj 1000 Unit/ML: INTRAMUSCULAR | Qty: 10 | Status: AC

## 2013-09-04 MED FILL — Magnesium Sulfate Inj 50%: INTRAMUSCULAR | Qty: 10 | Status: AC

## 2013-09-04 MED FILL — Mannitol IV Soln 20%: INTRAVENOUS | Qty: 500 | Status: AC

## 2013-09-04 MED FILL — Sodium Bicarbonate IV Soln 8.4%: INTRAVENOUS | Qty: 50 | Status: AC

## 2013-09-04 NOTE — Care Management Note (Signed)
    Page 1 of 1   09/08/2013     4:28:47 PM   CARE MANAGEMENT NOTE 09/08/2013  Patient:  [name], [name]   Account Number:  [id number]  Date Initiated:  09/03/2013  Documentation initiated by:  Luz Lex  Subjective/Objective Assessment:   Post op CABG x4     Action/Plan:   Anticipated DC Date:  09/08/2013   Anticipated DC Plan:  SKILLED NURSING FACILITY  In-house referral  Clinical Social Worker      DC Planning Services  CM consult      Choice offered to / List presented to:             Status of service:  Completed, signed off Medicare Important Message given?   (If response is "NO", the following Medicare IM given date fields will be blank) Date Medicare IM given:   Date Additional Medicare IM given:    Discharge Disposition:  [place]  Per UR Regulation:  Reviewed for med. necessity/level of care/duration of stay  If discussed at [place] of Stay Meetings, dates discussed:    Comments:  Contact:  [name],[name] Sister [phone]                 [name], [name] [phone]                 [name], [name]     [phone]  09/08/13 [place] Nikia Mangino,RN,BSN [phone] PT DISCHARGED TO SNF TODAY, PER CSW ARRANGEMENTS.  09-04-13 [place], RNBSN [phone] Patient sitting up in chair.  States lives at home alone. has looked into [facility] prior to admission and feels he could go there post discharge.  Also said there was an article in the [place] paper about free service with staying with the elderly.  Is going to bring that article in for [place] to look into.  We also talked about HH but would not be with him 24/7 and the there are agencies that he could pay privately to stay with him 24/7.  Wil verify PT/OT ordered, consulted SW and CM will continue to follow for any other needs.

## 2013-09-04 NOTE — Progress Notes (Addendum)
TCTS DAILY ICU PROGRESS NOTE                   [name].[address]            [name],[name] [id number]          [phone]   1 Day Post-Op Procedure(s) (LRB): CORONARY ARTERY BYPASS GRAFTING times four using left internal mammary and bilateral saphenous vein. (N/A) INTRAOPERATIVE TRANSESOPHAGEAL ECHOCARDIOGRAM (N/A)  Total Length of Stay:  LOS: 1 day   Subjective: Patient awake, alert. Requesting medicine for restless leg syndrome.  Objective: Vital signs in last 24 hours: Temp:  [97 F (36.1 C)-98.2 F (36.8 C)] 97.9 F (36.6 C) (01/16 0700) Pulse Rate:  [79-93] 83 (01/16 0700) Cardiac Rhythm:  [-] Atrial paced (01/16 0600) Resp:  [0-22] 14 (01/16 0700) BP: (86-125)/(57-76) 119/68 mmHg (01/16 0700) SpO2:  [96 %-100 %] 97 % (01/16 0700) Arterial Line BP: (77-127)/(53-72) 123/57 mmHg (01/16 0700) FiO2 (%):  [40 %-50 %] 40 % (01/15 2052) Weight:  [73 kg (160 lb 15 oz)-77 kg (169 lb 12.1 oz)] 77 kg (169 lb 12.1 oz) (01/16 0500)  Filed Weights   09/02/13 1300 09/03/13 1600 09/04/13 0500  Weight: 73.029 kg (161 lb) 73 kg (160 lb 15 oz) 77 kg (169 lb 12.1 oz)    Weight change: -0.029 kg (-1 oz)   Hemodynamic parameters for last 24 hours: PAP: (31-41)/(11-23) 37/14 mmHg CO:  [3.2 L/min-6 L/min] 6 L/min CI:  [1.7 L/min/m2-3.1 L/min/m2] 3.1 L/min/m2  Intake/Output from previous day: 01/15 0701 - 01/16 0700 In: 7302.5 [P.O.:400; I.V.:4283; Blood:1419.5; IV Piggyback:1200] Out: 4010 [Urine:2370; Blood:950; Chest Tube:690]  Intake/Output this shift:    Current Meds: Scheduled Meds: . acetaminophen  1,000 mg Oral Q6H   Or  . acetaminophen (TYLENOL) oral liquid 160 mg/5 mL  1,000 mg Per Tube Q6H  . aspirin EC  325 mg Oral Daily   Or  . aspirin  324 mg Per Tube Daily  . bisacodyl  10 mg Oral Daily   Or  . bisacodyl  10 mg Rectal Daily  . cefUROXime (ZINACEF)  IV  1.5 g Intravenous Q12H  . docusate sodium  200 mg Oral Daily  . famotidine (PEPCID) IV  20 mg  Intravenous Q12H  . insulin aspart  0-24 Units Subcutaneous Q4H  . insulin regular  0-10 Units Intravenous TID WC  . metoCLOPramide (REGLAN) injection  10 mg Intravenous Q6H  . metoprolol tartrate  12.5 mg Oral BID   Or  . metoprolol tartrate  12.5 mg Per Tube BID  . oxybutynin  10 mg Oral Daily  . [START ON 09/05/2013] pantoprazole  40 mg Oral Daily  . pantoprazole  80 mg Oral Daily  . pramipexole  1 mg Oral Daily  . pravastatin  80 mg Oral q1800  . sodium chloride  3 mL Intravenous Q12H  . [START ON 09/18/2013] testosterone cypionate  300 mg Intramuscular Q28 days   Continuous Infusions: . sodium chloride 20 mL/hr at 09/04/13 0700  . sodium chloride Stopped (09/03/13 2000)  . sodium chloride    . dexmedetomidine Stopped (09/03/13 1930)  . DOPamine 3 mcg/kg/min (09/04/13 0700)  . insulin (NOVOLIN-R) infusion 0.9 Units/hr (09/03/13 1600)  . lactated ringers Stopped (09/04/13 0600)  . nitroGLYCERIN Stopped (09/03/13 1400)  . phenylephrine (NEO-SYNEPHRINE) Adult infusion Stopped (09/04/13 0500)   PRN Meds:.albumin human, metoprolol, midazolam, morphine injection, ondansetron (ZOFRAN) IV, oxyCODONE, sodium chloride, zolpidem  General appearance: alert, cooperative and no distress Neurologic:  intact Heart: RRR, rub with chest tubes in place Lungs: Diminished at bases Abdomen: soft, non-tender; bowel sounds normal; no masses,  no organomegaly Extremities: Trace bilateral LE edema Wound: Sternal dressing intact;LE dressings are clean and dry  Lab Results: CBC: Recent Labs  09/03/13 1752 09/03/13 1754 09/04/13 0500  WBC 14.6*  --  10.0  HGB 11.3* 10.9* 11.0*  HCT 33.4* 32.0* 32.2*  PLT 188  --  162   BMET:  Recent Labs  09/01/13 1450  09/03/13 1754 09/04/13 0500  NA 141  < > 140 136*  K 4.0  < > 4.4 4.5  CL 104  < > 101 102  CO2 22  --   --  24  GLUCOSE 89  < > 134* 139*  BUN 16  < > 12 14  CREATININE 1.10  < > 1.00 0.92  CALCIUM 9.5  --   --  8.2*  < > = values  in this interval not displayed.  PT/INR:  Recent Labs  09/03/13 1340  LABPROT 17.2*  INR 1.44   Radiology: Dg Chest Portable 1 View In Am  09/04/2013   CLINICAL DATA:  CABG.  EXAM: PORTABLE CHEST - 1 VIEW  COMPARISON:  09/03/2013  FINDINGS: Endotracheal tube and NG tube have been removed. Left chest tube remains in place. No pneumothorax. Swan-Ganz catheter tip in the right lower lobe pulmonary artery is unchanged.  Improved lung volume. Bibasilar atelectasis remains. Small left effusion. Large hiatal hernia.  Negative for edema.  IMPRESSION: Bibasilar atelectasis remains with improved lung volume compared with the prior study.   Electronically Signed   By: Franchot Gallo M.D.   On: 09/04/2013 07:21   Dg Chest Port 1 View  09/03/2013   CLINICAL DATA:  On ventilator.  Postop from CABG.  Prostate cancer.  EXAM: PORTABLE CHEST - 1 VIEW  COMPARISON:  None.  FINDINGS: Support lines and tubes in appropriate position. No pneumothorax identified. Low lung volumes again seen. Left lower lobe atelectasis or infiltrate shows no significant change. Mild right pleural thickening or effusion also stable. Heart size and mediastinal contours are also unchanged. Prior CABG noted.  IMPRESSION: Stable low lung volumes with persistent left basilar atelectasis versus infiltrate. No significant change compared with prior exam.   Electronically Signed   By: Earle Gell M.D.   On: 09/03/2013 17:49   Dg Chest Portable 1 View  09/03/2013   CLINICAL DATA:  Postoperative CABG.  EXAM: PORTABLE CHEST - 1 VIEW  COMPARISON:  Chest radiograph September 01, 2013.  FINDINGS: Status post interval median sternotomy. Swan-Ganz catheter via right internal jugular venous approach with distal tip projecting in right pulmonary artery. Left chest tube with side port projecting within the chest [place]. Mediastinal drain. Endotracheal tube tip projects 3.8 cm above the carina. No pneumothorax. Large hiatal hernia.  Trace blunting of left  costophrenic angle not apparent. The cardiac silhouette appears at least mildly enlarged. Lobulated pleural thickening at right lung base unchanged. Strandy densities in left lung base. Multiple EKG lines overlie the patient and may obscure subtle underlying pathology. Soft tissue planes and included osseous structures are nonsuspicious.  IMPRESSION: Status post interval median sternotomy, with placement of Swan Ganz catheter, mediastinal and left chest tubes as well as intubation, in apparent good position.  Trace left pleural effusion suspected and left lung base atelectasis.   Electronically Signed   By: Elon Alas   On: 09/03/2013 14:02     Assessment/Plan: S/P Procedure(s) (LRB): CORONARY ARTERY  BYPASS GRAFTING times four using left internal mammary and bilateral saphenous vein. (N/A) INTRAOPERATIVE TRANSESOPHAGEAL ECHOCARDIOGRAM (N/A)  1.CV-EKG shows SR. He is having some PVCs this am and HR is 80. CO/CI very good.Wean Dopamine as tolerates. Continue Lopressor 12.5 bid. 2.Pulmonary-Chest tubes with 690 cc of output.Leave for now. CXR shows no pneumothorax, improving lung volumes, bibasilar atelectasis, small left pleural effusion, and large hiatal hernia. Encourage incentive spirometer. 3.Volume overload- 4. ABL anemia-H and H stable at 11 and 32.2 5. CBGs 142/159/147. Will start low dose Levemir to keep off insulin drip. Pre op HGA1C 5.2.  6.Mirapex for RLS 7.Please see progression orders    Arnoldo Lenis 09/04/2013 7:47 AM patient examined and medical record reviewed,agree with above note. VAN TRIGT III,Seirra Kos 09/04/2013

## 2013-09-04 NOTE — Progress Notes (Signed)
TCTS BRIEF SICU PROGRESS NOTE  1 Day Post-Op  S/P Procedure(s) (LRB): CORONARY ARTERY BYPASS GRAFTING times four using left internal mammary and bilateral saphenous vein. (N/A) INTRAOPERATIVE TRANSESOPHAGEAL ECHOCARDIOGRAM (N/A)   Stable day NSR w/ PAC's Stable BP O2 sats 95% on 2 L/min UOP adequate  Plan: Continue current plan  [name] H 09/04/2013 7:45 PM

## 2013-09-05 ENCOUNTER — Inpatient Hospital Stay (HOSPITAL_COMMUNITY): Payer: Medicare PPO

## 2013-09-05 LAB — CBC
HCT: 36.3 % — ABNORMAL LOW (ref 39.0–52.0)
Hemoglobin: 12.4 g/dL — ABNORMAL LOW (ref 13.0–17.0)
MCH: 32.5 pg (ref 26.0–34.0)
MCHC: 34.2 g/dL (ref 30.0–36.0)
MCV: 95.3 fL (ref 78.0–100.0)
PLATELETS: 182 10*3/uL (ref 150–400)
RBC: 3.81 MIL/uL — ABNORMAL LOW (ref 4.22–5.81)
RDW: 14.2 % (ref 11.5–15.5)
WBC: 13.6 10*3/uL — ABNORMAL HIGH (ref 4.0–10.5)

## 2013-09-05 LAB — GLUCOSE, CAPILLARY
Glucose-Capillary: 109 mg/dL — ABNORMAL HIGH (ref 70–99)
Glucose-Capillary: 136 mg/dL — ABNORMAL HIGH (ref 70–99)
Glucose-Capillary: 80 mg/dL (ref 70–99)
Glucose-Capillary: 110 mg/dL — ABNORMAL HIGH (ref 70–99)

## 2013-09-05 LAB — BASIC METABOLIC PANEL
BUN: 17 mg/dL (ref 6–23)
CO2: 23 meq/L (ref 19–32)
CREATININE: 1.09 mg/dL (ref 0.50–1.35)
Calcium: 9 mg/dL (ref 8.4–10.5)
Chloride: 94 mEq/L — ABNORMAL LOW (ref 96–112)
GFR calc Af Amer: 70 mL/min — ABNORMAL LOW (ref >90)
GFR calc non Af Amer: 60 mL/min — ABNORMAL LOW (ref >90)
GLUCOSE: 113 mg/dL — AB (ref 70–99)
Potassium: 4.1 mEq/L (ref 3.7–5.3)
Sodium: 131 mEq/L — ABNORMAL LOW (ref 137–147)

## 2013-09-05 MED ORDER — INSULIN ASPART 100 UNIT/ML ~~LOC~~ SOLN: 0.0000 [IU] | Freq: Every day | SUBCUTANEOUS | Status: DC

## 2013-09-05 MED ORDER — AMIODARONE HCL 200 MG PO TABS
400.0000 mg | ORAL_TABLET | Freq: Two times a day (BID) | ORAL | Status: DC
  Administered 2013-09-05 – 2013-09-06 (×3): 400 mg via ORAL
  Filled 2013-09-05 (×5): qty 2

## 2013-09-05 MED ORDER — TRAMADOL HCL 50 MG PO TABS
50.0000 mg | ORAL_TABLET | Freq: Four times a day (QID) | ORAL | Status: DC | PRN
  Administered 2013-09-06: 50 mg via ORAL
  Filled 2013-09-05: qty 1

## 2013-09-05 MED ORDER — INSULIN ASPART 100 UNIT/ML ~~LOC~~ SOLN
0.0000 [IU] | Freq: Three times a day (TID) | SUBCUTANEOUS | Status: DC
  Administered 2013-09-05: 2 [IU] via SUBCUTANEOUS

## 2013-09-05 NOTE — Progress Notes (Signed)
Upon hourly rounding assessment, pt was noted to be attempting to get OOB without assistance and pt was pulling on CVL. CVL was not out of place, but line was kinked over on itself and was no longer infusing IVF. Line was d/c and occlusive dressing was applied. MD Roxy Manns made aware- order to DC IV amio and convert to PO and DC IV dopamine.   Will monitor. Bed alarm set and sitter requested from house  Lorre Munroe

## 2013-09-05 NOTE — Op Note (Signed)
[name], [name] NO.:  [id number]  MEDICAL RECORD NO.:  [id number]  LOCATION:  2S16C                        FACILITY:  [facility]  PHYSICIAN:  Ivin Poot, M.D.  DATE OF BIRTH:  23-Dec-1926  DATE OF PROCEDURE:  09/04/2013 DATE OF DISCHARGE:                              OPERATIVE REPORT   OPERATION: 1. Coronary artery bypass grafting x4 (left internal mammary artery to     LAD, saphenous vein graft to diagonal, sequential saphenous vein     graft to OM1 and OM2). 2. Endoscopic harvest of right and left leg greater saphenous vein.  PREOPERATIVE DIAGNOSES:  Class III angina, left main stenosis and multivessel coronary artery disease.  POSTOPERATIVE DIAGNOSES:  Class III angina, left main stenosis and multivessel coronary artery disease.  SURGEON:  Ivin Poot, M.D.  ASSISTANT:  Providence Crosby, PA-C  ANESTHESIA:  General by Sharolyn Douglas, M.D.  INDICATIONS:  The patient is an 80 year old Caucasian male, who was being prepared for right total knee replacement for severe degenerative arthritis.  His preoperative screening demonstrated an abnormal EKG. Subsequent stress test was abnormal and cardiac catheterization demonstrated left main stenosis with multivessel CAD.  A 2D echocardiogram showed fairly well-preserved LV systolic function without significant valvular disease.  The patient was referred for evaluation for CABG and was felt to be an appropriate candidate.  I discussed the procedure in detail with the patient and his family including the use of general anesthesia and cardiopulmonary bypass, the location of the surgical incisions, the expected postoperative hospital recovery, and the strategy that after 3 months recovery following heart surgery, he would be in good shape for a total knee replacement.  I also reviewed the risks of CABG with the patient and family including the risks of MI, stroke, bleeding, blood transfusion requirement,  pneumonia or infection, postoperative pleural effusion, postoperative arrhythmias, and death. After reviewing these issues, he demonstrated his understanding and agreed to proceed with surgery under what I felt was an informed consent.  FINDINGS: 1. Calcification and plaque of the ascending aorta. 2. Adequate targets and conduit. 3. Intraoperative coagulopathy after reversal of heparin with     protamine requiring platelets and FFP with improved coagulation and     function.  OPERATIVE PROCEDURE:  The patient was brought to the operating room and placed supine on the operating room table, where general anesthesia was induced under invasive hemodynamic monitoring.  The chest, abdomen, and legs were prepped with Betadine and draped as a sterile field.  A sternal incision was made as the saphenous vein was harvested first from the right leg and then from the left leg with endoscopic vein technique. The left internal mammary artery was harvested as a pedicle graft from its origin at the subclavian vessels and was a good vessel with excellent flow.  The sternal retractor was placed and the pericardium was opened and suspended.  Pursestrings were placed in the ascending aorta and right atrium.  The site of cannulation and site of cross clamp on the aorta was planned and according to avoiding areas of calcification and heavy plaque, especially on the right side of the ascending aorta.  After heparin was administered and the ACT was documented as being therapeutic, the patient was cannulated and placed on cardiopulmonary bypass.  The coronary arteries were identified for grafting and the mammary artery and vein grafts were prepared for the distal anastomoses. Cardioplegic cannulas were placed both antegrade and retrograde cold blood cardioplegia and the patient was cooled to 32 degrees.  The aortic crossclamp was carefully placed in a position to minimize pressure on the plaque and calcium  in the aorta.  A 1 L of cold blood cardioplegia was delivered in split doses between the antegrade aortic and retrograde coronary sinus catheters.  There was good cardioplegic arrest and septal temperature dropped less than 12 degrees.  Cardioplegia was delivered every 20 minutes or less.  The distal coronary anastomoses were then performed.  The first distal anastomosis was to the diagonal branch to the LAD.  This was a 1.4-mm vessel proximal 90% stenosis.  A reverse saphenous vein was sewn end-to- side with running 7-0 Prolene with good flow through the graft. Cardioplegia was redosed.  The second and third distal anastomoses were then constructed using a sequential vein graft.  The second distal anastomosis was to the OM1 branch of the left circumflex.  This was 1.5-mm vessel proximal 90% stenosis.  The vein was sewn side-to-side with running 7-0 Prolene with good flow through the graft.  Cardioplegia was redosed.  The third distal anastomosis was a continuation of the saphenous vein graft to the OM2.  This was a 1.5-mm vessel with proximal 80% stenosis. The end of the vein was sewn end-to-side with running 7-0 Prolene with good flow through the graft.  Cardioplegia was redosed.  A fourth distal anastomosis was to the distal third of the LAD.  It had a proximal 90% stenosis.  The left IMA pedicle was brought through an opening created in the left lateral pericardium, was brought down onto the LAD and sewn end-to-side with running 8-0 Prolene.  There was good flow through the anastomosis after briefly releasing the pedicle bulldog and the mammary artery.  The bulldog was reapplied and the pedicle was secured of the epicardium with 6-0 Prolene.  Cardioplegia was redosed.  While the crossclamp was still in place, 2 proximal vein anastomoses were performed on the ascending aorta with a 4.5 mm punch and running 6- 0 Prolene.  Prior to tying down the final proximal anastomosis, air  was vented from the coronaries with a dose of retrograde warm blood cardioplegia.  The crossclamp was removed.  The heart resumed a spontaneous rhythm.  The vein grafts were de-aired and opened and each had good flow.  Hemostasis was documented to the proximal distal anastomoses.  The patient was rewarmed and reperfused.  Temporary pacing wires were applied.  The lungs were expanded and ventilator was resumed.  When the patient was adequately reperfused and rewarmed, he was weaned from cardiopulmonary bypass on low-dose dopamine.  He separated from bypass easily with stable hemodynamics.  Echo showed preserved LV global function.  Protamine was administered without adverse reaction.  The cannula was removed.  There was still coagulopathy and the patient was given platelets and FFP.  The superior pericardial fat was closed over the aorta.  Anterior mediastinal and left pleural chest tube were placed and brought out through separate incisions.  The sternum was closed with interrupted steel wire.  The pectoralis fascia was closed in a running #1 Vicryl. The subcutaneous and skin layers were closed in running Vicryl and sterile dressings were applied.  Total cardiopulmonary bypass time was 112 minutes.     Ivin Poot, M.D.     PV/MEDQ  D:  09/04/2013  T:  09/05/2013  Job:  [id number]

## 2013-09-05 NOTE — Evaluation (Signed)
Physical Therapy Evaluation Patient Details Name: [name] MRN: [id number] DOB: [birth year] Today's Date: 09/05/2013 Time: 2458-0998 PT Time Calculation (min): 25 min  PT Assessment / Plan / Recommendation History of Present Illness  pt s/p CABG in attempt to prepare himself for TKR  Clinical Impression  Pt admitted for CABG in preparation for TKR soon. Pt currently with functional limitations due to the deficits listed below (see PT Problem List).  Pt will benefit from skilled PT to increase their independence and safety with mobility to allow discharge to the venue listed below.       PT Assessment  Patient needs continued PT services    Follow Up Recommendations  SNF    Does the patient have the potential to tolerate intense rehabilitation      Barriers to Discharge Decreased caregiver support      Equipment Recommendations  Other (comment) (TBA)    Recommendations for Other Services     Frequency Min 3X/week    Precautions / Restrictions Precautions Precautions: Fall (minimal risk due to poor knees) Restrictions Weight Bearing Restrictions: No   Pertinent Vitals/Pain sats on 2L during amb 99%  EHR in the 80'90's      Mobility  Bed Mobility Overal bed mobility: Needs Assistance Bed Mobility: Supine to Sit Supine to sit: Min assist General bed mobility comments: cues to follow sternal precautions Transfers Overall transfer level: Needs assistance Transfers: Sit to/from Stand Sit to Stand: Min assist General transfer comment: cues to follow sternal precautions Ambulation/Gait Ambulation/Gait assistance: Min guard Ambulation Distance (Feet): 330 Feet Assistive device:  (pushing W/C) Gait Pattern/deviations: Step-through pattern Gait velocity: WFL Gait velocity interpretation: at or above normal speed for age/gender    Exercises     PT Diagnosis: Generalized weakness;Other (comment) (decr activity tolerance)  PT Problem List: Decreased strength;Decreased  activity tolerance;Decreased mobility;Decreased knowledge of use of DME;Cardiopulmonary status limiting activity PT Treatment Interventions: DME instruction;Gait training;Functional mobility training;Therapeutic activities;Patient/family education     PT Goals(Current goals can be found in the care plan section) Acute Rehab PT Goals Patient Stated Goal: back home eventually; able to get my knees done PT Goal Formulation: With patient Time For Goal Achievement: 09/12/13 Potential to Achieve Goals: Good  Visit Information  Last PT Received On: 09/05/13 Assistance Needed: +1 History of Present Illness: pt s/p CABG in attempt to prepare himself for TKR       Prior [place] expects to be discharged to:: Skilled nursing facility Living Arrangements: Alone Prior Function Level of Independence: Independent Communication Communication: No difficulties    Cognition  Cognition Arousal/Alertness: Awake/alert Behavior During Therapy: WFL for tasks assessed/performed Overall Cognitive Status: Impaired/Different from baseline    Extremity/Trunk Assessment Upper Extremity Assessment Upper Extremity Assessment: Overall WFL for tasks assessed (limited assessment) Lower Extremity Assessment Lower Extremity Assessment: Generalized weakness;Overall WFL for tasks assessed (bil poor knees)   Balance Balance Overall balance assessment: No apparent balance deficits (not formally assessed)  End of Session PT - End of Session Equipment Utilized During Treatment: Oxygen Activity Tolerance: Patient tolerated treatment well Patient left: in chair;with call bell/phone within reach;with family/visitor present Nurse Communication: Mobility status  GP     Eulon Allnutt, Tessie Fass 09/05/2013, 12:47 PM 09/05/2013  Donnella Sham, [place] [phone]  (pager)

## 2013-09-05 NOTE — Progress Notes (Addendum)
[address]       [place] [address]             [phone]        CARDIOTHORACIC SURGERY PROGRESS NOTE   R2 Days Post-Op Procedure(s) (LRB): CORONARY ARTERY BYPASS GRAFTING times four using left internal mammary and bilateral saphenous vein. (N/A) INTRAOPERATIVE TRANSESOPHAGEAL ECHOCARDIOGRAM (N/A)  Subjective: Feels pretty well.  Reportedly mildly confused earlier, currently alert, oriented and entirely appropriate.  Denies pain, SOB.  Didn't sleep well.  Appetite improving.  Objective: Vital signs: BP Readings from Last 1 Encounters:  09/05/13 121/67   Pulse Readings from Last 1 Encounters:  09/05/13 79   Resp Readings from Last 1 Encounters:  09/05/13 17   Temp Readings from Last 1 Encounters:  09/05/13 98.1 F (36.7 C) Oral    Hemodynamics:    Physical Exam:  Rhythm:   Sinus, some Afib overnight  Breath sounds: clear  Heart sounds:  RRR  Incisions:  Dressing dry, intact  Abdomen:  Soft, non-distended, non-tender  Extremities:  Warm, well-perfused    Intake/Output from previous day: 01/16 0701 - 01/17 0700 In: 1088.6 [P.O.:240; I.V.:798.6; IV Piggyback:50] Out: 2330 [Urine:2080; Chest Tube:250] Intake/Output this shift: Total I/O In: 30.8 [I.V.:30.8] Out: 50 [Urine:50]  Lab Results:  CBC: Recent Labs  09/04/13 1620 09/04/13 1650 09/05/13 0259  WBC 10.8*  --  13.6*  HGB 10.9* 11.2* 12.4*  HCT 32.0* 33.0* 36.3*  PLT 167  --  182    BMET:  Recent Labs  09/04/13 0500  09/04/13 1650 09/05/13 0259  NA 136*  --  134* 131*  K 4.5  --  4.5 4.1  CL 102  --  95* 94*  CO2 24  --   --  23  GLUCOSE 139*  --  102* 113*  BUN 14  --  17 17  CREATININE 0.92  < > 1.20 1.09  CALCIUM 8.2*  --   --  9.0  < > = values in this interval not displayed.   CBG (last 3)   Recent Labs  09/04/13 1537 09/04/13 1934 09/04/13 2304  GLUCAP 122* 100* 100*    ABG    Component Value Date/Time   PHART 7.331* 09/03/2013 2241   PCO2ART 50.0* 09/03/2013 2241   PO2ART 108.0* 09/03/2013 2241   HCO3 26.6* 09/03/2013 2241   TCO2 27 09/04/2013 1650   O2SAT 98.0 09/03/2013 2241    CXR: CLINICAL DATA: Coronary artery disease. Prostate carcinoma. Postop  from CABG.  EXAM:  PORTABLE CHEST - 1 VIEW  COMPARISON: 09/04/2013  FINDINGS:  Swan-Ganz catheter and left chest tube have been removed. No  pneumothorax identified. Left greater than right basilar atelectasis  shows no significant change. Cardiomegaly is stable. No evidence of  congestive heart failure.  IMPRESSION:  Persistent bibasilar atelectasis, left side greater than right. No  pneumothorax identified following chest tube removal.  Electronically Signed  By: Earle Gell M.D.  On: 09/05/2013 07:38    Assessment/Plan: S/P Procedure(s) (LRB): CORONARY ARTERY BYPASS GRAFTING times four using left internal mammary and bilateral saphenous vein. (N/A) INTRAOPERATIVE TRANSESOPHAGEAL ECHOCARDIOGRAM (N/A)  Overall doing well POD2 Expected post op acute blood loss anemia, very mild Expected post op atelectasis, mild Post op Afib, now NSR on IV amiodarone CBG's low with no h/o diabetes and normal Hgb a1c preop   Mobilize  Diuresis  pulm toilet  Continue amiodarone for now  D/C last chest tube  D/C foley  Change  CBG's to ac/hs and stop levemir   [name],[name] 09/05/2013 9:07 AM

## 2013-09-05 NOTE — Progress Notes (Signed)
[facility] BRIEF SICU PROGRESS NOTE  2 Days Post-Op  S/P Procedure(s) (LRB): CORONARY ARTERY BYPASS GRAFTING times four using left internal mammary and bilateral saphenous vein. (N/A) INTRAOPERATIVE TRANSESOPHAGEAL ECHOCARDIOGRAM (N/A)   Stable day although still slightly confused NSR w/ stable BP  Plan: Will d/c morphine and other sedatives, try ultram for pain  Gen Clagg H 09/05/2013 5:53 PM

## 2013-09-06 ENCOUNTER — Inpatient Hospital Stay (HOSPITAL_COMMUNITY): Payer: Medicare PPO

## 2013-09-06 LAB — BASIC METABOLIC PANEL
BUN: 18 mg/dL (ref 6–23)
CALCIUM: 8.5 mg/dL (ref 8.4–10.5)
CO2: 29 meq/L (ref 19–32)
Chloride: 93 mEq/L — ABNORMAL LOW (ref 96–112)
Creatinine, Ser: 1.05 mg/dL (ref 0.50–1.35)
GFR calc Af Amer: 73 mL/min — ABNORMAL LOW (ref >90)
GFR calc non Af Amer: 63 mL/min — ABNORMAL LOW (ref >90)
GLUCOSE: 103 mg/dL — AB (ref 70–99)
Potassium: 3.9 mEq/L (ref 3.7–5.3)
Sodium: 133 mEq/L — ABNORMAL LOW (ref 137–147)

## 2013-09-06 LAB — GLUCOSE, CAPILLARY: Glucose-Capillary: 81 mg/dL (ref 70–99)

## 2013-09-06 LAB — CBC
HCT: 29.3 % — ABNORMAL LOW (ref 39.0–52.0)
HEMOGLOBIN: 10 g/dL — AB (ref 13.0–17.0)
MCH: 32.4 pg (ref 26.0–34.0)
MCHC: 34.1 g/dL (ref 30.0–36.0)
MCV: 94.8 fL (ref 78.0–100.0)
PLATELETS: 159 10*3/uL (ref 150–400)
RBC: 3.09 MIL/uL — ABNORMAL LOW (ref 4.22–5.81)
RDW: 14.4 % (ref 11.5–15.5)
WBC: 9.2 10*3/uL (ref 4.0–10.5)

## 2013-09-06 MED ORDER — AMIODARONE HCL 200 MG PO TABS
200.0000 mg | ORAL_TABLET | Freq: Two times a day (BID) | ORAL | Status: DC
  Administered 2013-09-06 – 2013-09-08 (×4): 200 mg via ORAL
  Filled 2013-09-06 (×5): qty 1

## 2013-09-06 MED ORDER — SODIUM CHLORIDE 0.9 % IJ SOLN
3.0000 mL | INTRAMUSCULAR | Status: DC | PRN
Start: 2013-09-06 — End: 2013-09-08

## 2013-09-06 MED ORDER — MOVING RIGHT ALONG BOOK
Freq: Once | Status: AC
  Administered 2013-09-06: 18:00:00
  Filled 2013-09-06: qty 1

## 2013-09-06 MED ORDER — ENOXAPARIN SODIUM 40 MG/0.4ML ~~LOC~~ SOLN
40.0000 mg | SUBCUTANEOUS | Status: DC
  Administered 2013-09-06 – 2013-09-07 (×2): 40 mg via SUBCUTANEOUS
  Filled 2013-09-06 (×3): qty 0.4

## 2013-09-06 MED ORDER — SODIUM CHLORIDE 0.9 % IJ SOLN
3.0000 mL | Freq: Two times a day (BID) | INTRAMUSCULAR | Status: DC
  Administered 2013-09-06: 3 mL via INTRAVENOUS
  Administered 2013-09-07: 22:00:00 via INTRAVENOUS
  Administered 2013-09-07 – 2013-09-08 (×2): 3 mL via INTRAVENOUS

## 2013-09-06 MED ORDER — FUROSEMIDE 40 MG PO TABS
40.0000 mg | ORAL_TABLET | Freq: Every day | ORAL | Status: DC
  Administered 2013-09-06 – 2013-09-08 (×3): 40 mg via ORAL
  Filled 2013-09-06 (×4): qty 1

## 2013-09-06 MED ORDER — SODIUM CHLORIDE 0.9 % IV SOLN: 250.0000 mL | INTRAVENOUS | Status: DC | PRN

## 2013-09-06 MED ORDER — POTASSIUM CHLORIDE CRYS ER 20 MEQ PO TBCR
20.0000 meq | EXTENDED_RELEASE_TABLET | Freq: Every day | ORAL | Status: DC
  Administered 2013-09-06 – 2013-09-08 (×3): 20 meq via ORAL
  Filled 2013-09-06 (×3): qty 1

## 2013-09-06 NOTE — Progress Notes (Addendum)
      [address]       [place] [address]             [phone]        CARDIOTHORACIC SURGERY PROGRESS NOTE   R3 Days Post-Op Procedure(s) (LRB): CORONARY ARTERY BYPASS GRAFTING times four using left internal mammary and bilateral saphenous vein. (N/A) INTRAOPERATIVE TRANSESOPHAGEAL ECHOCARDIOGRAM (N/A)  Subjective: Looks good and feels well.  Slept well last night.  Confusion resolved, likely related to sedatives  Objective: Vital signs: BP Readings from Last 1 Encounters:  09/06/13 118/72   Pulse Readings from Last 1 Encounters:  09/06/13 72   Resp Readings from Last 1 Encounters:  09/06/13 12   Temp Readings from Last 1 Encounters:  09/06/13 97.7 F (36.5 C) Oral    Hemodynamics:    Physical Exam:  Rhythm:   sinus  Breath sounds: clear  Heart sounds:  RRR  Incisions:  Clean and dry  Abdomen:  Soft, non-distended, non-tender  Extremities:  Warm, well-perfused    Intake/Output from previous day: 01/17 0701 - 01/18 0700 In: 694.8 [P.O.:480; I.V.:214.8] Out: 2015 [Urine:2015] Intake/Output this shift:    Lab Results:  CBC: Recent Labs  09/05/13 0259 09/06/13 0250  WBC 13.6* 9.2  HGB 12.4* 10.0*  HCT 36.3* 29.3*  PLT 182 159    BMET:  Recent Labs  09/05/13 0259 09/06/13 0250  NA 131* 133*  K 4.1 3.9  CL 94* 93*  CO2 23 29  GLUCOSE 113* 103*  BUN 17 18  CREATININE 1.09 1.05  CALCIUM 9.0 8.5     CBG (last 3)   Recent Labs  09/05/13 1145 09/05/13 1644 09/05/13 2141  GLUCAP 136* 80 110*    ABG    Component Value Date/Time   PHART 7.331* 09/03/2013 2241   PCO2ART 50.0* 09/03/2013 2241   PO2ART 108.0* 09/03/2013 2241   HCO3 26.6* 09/03/2013 2241   TCO2 27 09/04/2013 1650   O2SAT 98.0 09/03/2013 2241    CXR: CLINICAL DATA: Followup atelectasis  EXAM:  CHEST 2 VIEW  COMPARISON: One hundred seventeen fifth  FINDINGS:  Large hiatal hernia. Stable mild cardiac silhouette enlargement  status post CABG. No  pneumothorax. Bilateral mild to moderate lower  lobe atelectasis is stable. Small left pleural effusion.  IMPRESSION:  Larger fluid containing structure in the middle mediastinum  consistent with hiatal hernia. Small left effusion. Moderate stable  bilateral atelectasis.  Electronically Signed  By: Skipper Cliche M.D.  On: 09/06/2013 07:43   Assessment/Plan: S/P Procedure(s) (LRB): CORONARY ARTERY BYPASS GRAFTING times four using left internal mammary and bilateral saphenous vein. (N/A) INTRAOPERATIVE TRANSESOPHAGEAL ECHOCARDIOGRAM (N/A)  Overall doing well POD3 Post op confusion, resolved, likely related to sedatives and narcotics  Expected post op acute blood loss anemia, mild, slightly worse  Expected post op atelectasis, mild  Post op Afib, now maintaining NSR on amiodarone  CBG's low normal with no h/o diabetes and normal Hgb a1c preop   Mobilize  Diuresis  pulm toilet  Continue amiodarone for now but decrease dose Stop CBG's and insulin lovenox for DVT prophylaxis Transfer step down  Medina Degraffenreid H 09/06/2013 9:54 AM

## 2013-09-06 NOTE — Progress Notes (Signed)
Patient's cardiac rhythm converted to Atrial Fibrillation at end of ambulating in hall. Pt slightly shob but recovered quickly with rest. He denies CP, lightheadedness, or palpitations. Amiodarone and metoprolol given as ordered. After almost an hour pt still in a-fib, rate 100-120.  BP 109/71.  Dr. Roxy Manns notified. MD states to monitor pt and call back if sustained HR over 120 bpm. Plan explained to pt, instructed to notify nurse for any symptoms.

## 2013-09-06 NOTE — Progress Notes (Signed)
Protocol assessment done on Mr. Spreen.  Score 5.  X rays show some atelectasis.  IS has been ordered and suggest continuing with the IS.  Pt is clear and on no O2.

## 2013-09-06 NOTE — Progress Notes (Signed)
Pt t/x to 2w22 on monitor in wheelchair without event. Pt's partial plate in mouth at time of transfer, Colletta Maryland RN at bedside for pt arrival to unit and telemetry hookup  Lorre Munroe

## 2013-09-06 NOTE — Progress Notes (Signed)
Pt requested his temp to be checked; temp 98.4; pt c/o soreness at L shoulder blade site; pt requested heat pack; heat pack applied; will cont. To monitor.

## 2013-09-07 LAB — CBC
HEMATOCRIT: 29.3 % — AB (ref 39.0–52.0)
Hemoglobin: 10.1 g/dL — ABNORMAL LOW (ref 13.0–17.0)
MCH: 32.4 pg (ref 26.0–34.0)
MCHC: 34.5 g/dL (ref 30.0–36.0)
MCV: 93.9 fL (ref 78.0–100.0)
Platelets: 185 10*3/uL (ref 150–400)
RBC: 3.12 MIL/uL — AB (ref 4.22–5.81)
RDW: 14.4 % (ref 11.5–15.5)
WBC: 9.4 10*3/uL (ref 4.0–10.5)

## 2013-09-07 LAB — BASIC METABOLIC PANEL
BUN: 21 mg/dL (ref 6–23)
CHLORIDE: 96 meq/L (ref 96–112)
CO2: 28 meq/L (ref 19–32)
Calcium: 8.6 mg/dL (ref 8.4–10.5)
Creatinine, Ser: 1.2 mg/dL (ref 0.50–1.35)
GFR calc Af Amer: 62 mL/min — ABNORMAL LOW (ref >90)
GFR, EST NON AFRICAN AMERICAN: 54 mL/min — AB (ref >90)
GLUCOSE: 88 mg/dL (ref 70–99)
Potassium: 4.3 mEq/L (ref 3.7–5.3)
SODIUM: 134 meq/L — AB (ref 137–147)

## 2013-09-07 MED ORDER — DILTIAZEM HCL 100 MG IV SOLR
5.0000 mg/h | INTRAVENOUS | Status: DC
  Filled 2013-09-07: qty 100

## 2013-09-07 MED ORDER — ZOLPIDEM TARTRATE 5 MG PO TABS
2.5000 mg | ORAL_TABLET | Freq: Every evening | ORAL | Status: DC | PRN
  Administered 2013-09-07: 2.5 mg via ORAL
  Filled 2013-09-07: qty 1

## 2013-09-07 NOTE — Progress Notes (Signed)
Ambulated in hallway with patient. Patient on RA. Ambulated pushing wheel chair. Patient walked approx. 300 feet. Tolerated walk well. This completes patients 1st walk for today. Elmarie Shiley R

## 2013-09-07 NOTE — Progress Notes (Signed)
CSW faxed over clinicals to [facility]. Patient has confirmed bed at [facility], but is awaiting insurance auth.  Jeanette Caprice, MSW, [place]

## 2013-09-07 NOTE — Progress Notes (Signed)
CARDIAC REHAB PHASE I   PRE:  Rate/Rhythm: 71 SR    BP: sitting 116/60    SaO2: 96 RA  MODE:  Ambulation: 350 ft   POST:  Rate/Rhythm: 84 sR    BP: sitting 138/77     SaO2: 92 RA   This was pts third walk today. Needed verbal reminders for sternal precautions to scoot to EOB. Stood fairly independently from raised position. Pt likes to be independent (trying to reach his bedroom slippers on floor). Used RW, quick pace. Reminders to keep feet inside RW on turns. Asked pt to stop x1 due to DOE. Sts his biggest c/o is his right knee pain. Very DOE/some wheeze after walk on EOB. SaO2 borderline. Encouraged IS. Return to bed. Instructed pt again on sternal precautions and slowing down his decision making. Will f/u in am.  [id number] Josephina Shih [place] CES, ACSM 09/07/2013 2:10 PM

## 2013-09-07 NOTE — Progress Notes (Signed)
Patient converted from A-fib to NSR at 0819. HR in 70's. MD notified of this change. Will continue to monitor. Elmarie Shiley R

## 2013-09-07 NOTE — Progress Notes (Addendum)
[address]       [place] [address]             [phone]      4 Days Post-Op  Procedure(s) (LRB): CORONARY ARTERY BYPASS GRAFTING times four using left internal mammary and bilateral saphenous vein. (N/A) INTRAOPERATIVE TRANSESOPHAGEAL ECHOCARDIOGRAM (N/A) Subjective: Feels well overall  Objective  Telemetry sinus rhythm currently, did have afib with RVR  Temp:  [97.7 F (36.5 C)-99.2 F (37.3 C)] 98 F (36.7 C) (01/19 0447) Pulse Rate:  [69-96] 96 (01/19 0447) Resp:  [18-20] 18 (01/19 0447) BP: (108-126)/(56-72) 126/69 mmHg (01/19 0447) SpO2:  [92 %-96 %] 95 % (01/19 0447) Weight:  [173 lb 4.8 oz (78.608 kg)] 173 lb 4.8 oz (78.608 kg) (01/19 0447)   Intake/Output Summary (Last 24 hours) at 09/07/13 0825 Last data filed at 09/07/13 0449  Gross per 24 hour  Intake    240 ml  Output    475 ml  Net   -235 ml       General appearance: alert, cooperative and no distress Heart: regular rate and rhythm Lungs: mildly dim in L>R base Abdomen: benign Extremities: no edema Wound: incis healig well  Lab Results:  Recent Labs  09/04/13 1620  09/06/13 0250 09/07/13 0310  NA  --   < > 133* 134*  K  --   < > 3.9 4.3  CL  --   < > 93* 96  CO2  --   < > 29 28  GLUCOSE  --   < > 103* 88  BUN  --   < > 18 21  CREATININE 1.05  < > 1.05 1.20  CALCIUM  --   < > 8.5 8.6  MG 2.2  --   --   --   < > = values in this interval not displayed. No results found for this basename: AST, ALT, ALKPHOS, BILITOT, PROT, ALBUMIN,  in the last 72 hours No results found for this basename: LIPASE, AMYLASE,  in the last 72 hours  Recent Labs  09/06/13 0250 09/07/13 0310  WBC 9.2 9.4  HGB 10.0* 10.1*  HCT 29.3* 29.3*  MCV 94.8 93.9  PLT 159 185   No results found for this basename: CKTOTAL, CKMB, TROPONINI,  in the last 72 hours No components found with this basename: POCBNP,  No results found for this basename: DDIMER,  in the last 72 hours No results  found for this basename: HGBA1C,  in the last 72 hours No results found for this basename: CHOL, HDL, LDLCALC, TRIG, CHOLHDL,  in the last 72 hours No results found for this basename: TSH, T4TOTAL, FREET3, T3FREE, THYROIDAB,  in the last 72 hours No results found for this basename: VITAMINB12, FOLATE, FERRITIN, TIBC, IRON, RETICCTPCT,  in the last 72 hours  Medications: Scheduled . acetaminophen  1,000 mg Oral Q6H  . amiodarone  200 mg Oral BID  . aspirin EC  325 mg Oral Daily  . bisacodyl  10 mg Oral Daily   Or  . bisacodyl  10 mg Rectal Daily  . docusate sodium  200 mg Oral Daily  . enoxaparin (LOVENOX) injection  40 mg Subcutaneous Q24H  . furosemide  40 mg Oral Daily  . metoprolol tartrate  12.5 mg Oral BID  . oxybutynin  10 mg Oral Daily  . pantoprazole  40 mg Oral Daily  . potassium chloride  20 mEq Oral Daily  . pramipexole  1  mg Oral Daily  . pravastatin  80 mg Oral q1800  . sodium chloride  3 mL Intravenous Q12H  . [START ON 09/18/2013] testosterone cypionate  300 mg Intramuscular Q28 days     Radiology/Studies:  Dg Chest 2 View  09/06/2013   CLINICAL DATA:  Followup atelectasis  EXAM: CHEST  2 VIEW  COMPARISON:  One hundred seventeen fifth  FINDINGS: Large hiatal hernia. Stable mild cardiac silhouette enlargement status post CABG. No pneumothorax. Bilateral mild to moderate lower lobe atelectasis is stable. Small left pleural effusion.  IMPRESSION: Larger fluid containing structure in the middle mediastinum consistent with hiatal hernia. Small left effusion. Moderate stable bilateral atelectasis.   Electronically Signed   By: Skipper Cliche M.D.   On: 09/06/2013 07:43    INR: Will add last result for INR, ABG once components are confirmed Will add last 4 CBG results once components are confirmed  Assessment/Plan: S/P Procedure(s) (LRB): CORONARY ARTERY BYPASS GRAFTING times four using left internal mammary and bilateral saphenous vein. (N/A) INTRAOPERATIVE  TRANSESOPHAGEAL ECHOCARDIOGRAM (N/A)   1 rhythm currently sinus, cont to observe on current rx, cancel cardizem order 2 routine rehab/ pulm toilet 3 labs stable/ sugars ok 4 poss home 24-48 hours, will leave epw's with afib so recent as may need further meds adjustment   LOS: 4 days    [name],Scott E 1/19/20158:25 AM  Back in NSR after brief run A fib after walking No coumadin DC EPW's tomorrow if maintains NSR

## 2013-09-07 NOTE — Evaluation (Signed)
Occupational Therapy Evaluation Patient Details Name: [name] MRN: [id number] DOB: 06-Jun-1927 Today's Date: 09/07/2013 Time: 0865-7846 OT Time Calculation (min): 26 min  OT Assessment / Plan / Recommendation History of present illness pt s/p CABG in attempt to prepare himself for TKR   Clinical Impression   Pt admitted with above.  He presents to OT with the below listed deficits and will benefit from continued OT to maximize safety and independence with BADLs.  Currently, he requires min A - supervision with BADLs.  He lives alone and does not have adequate assistance at [facility], so therefore, recommend SNF prior to discharge.    OT Assessment  Patient needs continued OT Services    Follow Up Recommendations  SNF;Supervision/Assistance - 24 hour    Barriers to Discharge Decreased caregiver support    Equipment Recommendations  3 in 1 bedside comode    Recommendations for Other Services    Frequency  Min 2X/week    Precautions / Restrictions Precautions Precautions: Fall;Sternal (minimal risk due to poor knees) Precaution Comments: Pt instructed in sternal precautions - requires min verbal cues Restrictions Weight Bearing Restrictions: No   Pertinent Vitals/Pain     ADL  Eating/Feeding: Independent Where Assessed - Eating/Feeding: Chair Grooming: Wash/dry hands;Wash/dry face;Teeth care;Brushing hair;Supervision/safety Where Assessed - Grooming: Unsupported standing Upper Body Bathing: Set up Where Assessed - Upper Body Bathing: Unsupported sitting Lower Body Bathing: Minimal assistance Where Assessed - Lower Body Bathing: Unsupported sit to stand Upper Body Dressing: Supervision/safety Where Assessed - Upper Body Dressing: Unsupported sitting Lower Body Dressing: Minimal assistance Where Assessed - Lower Body Dressing: Unsupported sit to stand Toilet Transfer: Min guard Toilet Transfer Method: Sit to stand;Stand pivot Science writer: Comfort height  toilet Toileting - Water quality scientist and Hygiene: Min guard Where Assessed - Best boy and Hygiene: Sit to stand from 3-in-1 or toilet Equipment Used: Rolling walker Transfers/Ambulation Related to ADLs: supervision with RW ADL Comments: Pt able to access feet without strain from UEs.  Pt with LOB posteriorly at times with sit to stand    OT Diagnosis: Generalized weakness  OT Problem List: Decreased strength;Decreased knowledge of use of DME or AE;Decreased knowledge of precautions OT Treatment Interventions: Self-care/ADL training;DME and/or AE instruction;Patient/family education;Balance training   OT Goals(Current goals can be found in the care plan section) Acute Rehab OT Goals Patient Stated Goal: To get back home OT Goal Formulation: With patient Time For Goal Achievement: 09/14/13 Potential to Achieve Goals: Good ADL Goals Pt Will Perform Grooming: with modified independence;standing Pt Will Perform Upper Body Bathing: with modified independence;sitting Pt Will Perform Lower Body Bathing: with modified independence;sit to/from stand Pt Will Perform Upper Body Dressing: with modified independence;sitting Pt Will Perform Lower Body Dressing: with modified independence;sit to/from stand Pt Will Transfer to Toilet: with modified independence;regular height toilet;ambulating Pt Will Perform Toileting - Clothing Manipulation and hygiene: with modified independence;sit to/from stand  Visit Information  Last OT Received On: 09/07/13 Assistance Needed: +1 History of Present Illness: pt s/p CABG in attempt to prepare himself for TKR       Prior Functioning     Home Living Family/patient expects to be discharged to:: Skilled nursing facility Living Arrangements: Alone Prior Function Level of Independence: Independent Communication Communication: No difficulties         Vision/Perception     Cognition  Cognition Arousal/Alertness:  Awake/alert Behavior During Therapy: WFL for tasks assessed/performed Overall Cognitive Status: Within Functional Limits for tasks assessed    Extremity/Trunk Assessment  Upper Extremity Assessment Upper Extremity Assessment: Overall WFL for tasks assessed Lower Extremity Assessment Lower Extremity Assessment: Defer to PT evaluation Cervical / Trunk Assessment Cervical / Trunk Assessment: Normal     Mobility Bed Mobility Overal bed mobility: Needs Assistance Bed Mobility: Sit to Sidelying Sit to sidelying: Supervision Transfers Overall transfer level: Needs assistance Equipment used: Rolling walker (2 wheeled) Transfers: Sit to/from [facility] Sit to Stand: Min guard;Min assist Stand pivot transfers: Supervision General transfer comment: Pt requires occasional min a to move sit to stand due to posterior LOB     Exercise     Balance Balance Overall balance assessment: Needs assistance Sitting-balance support: Feet unsupported Sitting balance-Leahy Scale: Normal Standing balance support: No upper extremity supported Standing balance-Leahy Scale: Good   End of Session OT - End of Session Equipment Utilized During Treatment: Rolling walker Activity Tolerance: Patient tolerated treatment well Patient left: in bed;with call bell/phone within reach Nurse Communication: Mobility status  GO     Hooria Gasparini M 09/07/2013, 11:24 AM

## 2013-09-07 NOTE — Progress Notes (Signed)
Clinical Social Work Department BRIEF PSYCHOSOCIAL ASSESSMENT 09/07/2013  Patient:  [name], [name]     Account Number:  [id number]     Admit date:  09/03/2013  Clinical Social Worker:  Megan Salon  Date/Time:  09/07/2013 10:41 AM  Referred by:  RN  Date Referred:  09/07/2013 Referred for  SNF Placement   Other Referral:   Interview type:  Patient Other interview type:    PSYCHOSOCIAL DATA Living Status:  ALONE Admitted from facility:   Level of care:   Primary support name:  Theresa Mulligan Primary support relationship to patient:  SIBLING Degree of support available:   Good, patient lives alone, but has a supportive brother and sister    CURRENT CONCERNS Current Concerns  Post-Acute Placement   Other Concerns:    SOCIAL WORK ASSESSMENT / PLAN Clinical Social Worker received referral for SNF placement at d/c. CSW introduced self and explained reason for visit. CSW explained SNF process to patient. Patient reported he is agreeable for SNF placement and has already spoken with [facility] in [place], [place] and would like to go there. CSW encouraged patient to think about additional SNF options pending availability of preferred facility. Patient appeared content with his choice of [facility]. CSW will complete FL2 for MD's signature and will update patient when bed offers are received.   Assessment/plan status:  Psychosocial Support/Ongoing Assessment of Needs Other assessment/ plan:   Information/referral to community resources:   SNF information    PATIENT'S/FAMILY'S RESPONSE TO PLAN OF CARE: Patient is agreeable to snf placement and prefers [facility].       Jeanette Caprice, MSW, [place]

## 2013-09-07 NOTE — Discharge Instructions (Signed)
Activity: 1.May walk up steps                2.No lifting more than ten pounds for four weeks.                 3.No driving for four weeks.                4.Stop any activity that causes chest pain, shortness of breath, dizziness,sweating or excessive weakness.                5.Avoid straining.                6.Continue with your breathing exercises daily.  Diet: Low fat, Low salt diet   Coronary Artery Bypass Grafting, Care After Refer to this sheet in the next few weeks. These instructions provide you with information on caring for yourself after your procedure. Your health care provider may also give you more specific instructions. Your treatment has been planned according to current medical practices, but problems sometimes occur. Call your health care provider if you have any problems or questions after your procedure. WHAT TO EXPECT AFTER THE PROCEDURE Recovery from surgery will be different for everyone. Some people feel well after 3 or 4 weeks, while for others it takes longer. After your procedure, it is typical to have the following:  Nausea and a lack of appetite.   Constipation.  Weakness and fatigue.   Depression or irritability.   Pain or discomfort at your incision site. HOME CARE INSTRUCTIONS  Only take over-the-counter or prescription medicines as directed by your health care provider. Take all medicines exactly as directed. Do not stop taking medicines or start any new medicines without first checking with your health care provider.   Take your pulse as directed by your health care provider.  Perform deep breathing as directed by your health care provider. If you were given a device called an incentive spirometer, use it to practice deep breathing several times a day. Support your chest with a pillow or your arms when you take deep breaths or cough.  Keep incision areas clean, dry, and protected. Remove or change any bandages (dressings) only as directed by your  health care provider. You may have skin adhesive strips over the incision areas. Do not take the strips off. They will fall off on their own.  Check incision areas daily for any swelling, redness, or drainage.  If incisions were made in your legs, do the following:  Avoid crossing your legs.   Avoid sitting for long periods of time. Change positions every 30 minutes.   Elevate your legs when you are sitting.   Wear compression stockings as directed by your health care provider. These stockings help keep blood clots from forming in your legs.  Take showers once your health care provider approves. Until then, only take sponge baths. Pat incisions dry. Do not rub incisions with a washcloth or towel. Do not take tub baths or go swimming until your health care provider approves.  Eat foods that are high in fiber, such as raw fruits and vegetables, whole grains, beans, and nuts. Meats should be lean cut. Avoid canned, processed, and fried foods.  Drink enough fluids to keep your urine clear or pale yellow.  Weigh yourself every day. This helps identify if you are retaining fluid that may make your heart and lungs work harder.   Rest and limit activity as directed by your health care provider. You may  be instructed to:  Stop any activity at once if you have chest pain, shortness of breath, irregular heartbeats, or dizziness. Get help right away if you have any of these symptoms.  Move around frequently for short periods or take short walks as directed by your health care provider. Increase your activities gradually. You may need physical therapy or cardiac rehabilitation to help strengthen your muscles and build your endurance.  Avoid lifting, pushing, or pulling anything heavier than 10 lb (4.5 kg) for at least 6 weeks after surgery.  Do not drive until your health care provider approves.  Ask your health care provider when you may return to work and resume sexual activity.  Follow  up with your health care provider as directed.  SEEK MEDICAL CARE IF:  You have swelling, redness, increasing pain, or drainage at the site of an incision.   You develop a fever.   You have swelling in your ankles or legs.   You have pain in your legs.   You have weight gain of 2 or more pounds a day.  You are nauseous or vomit.  You have diarrhea. SEEK IMMEDIATE MEDICAL CARE IF:  You have chest pain that goes to your jaw or arms.  You have shortness of breath.   You have a fast or irregular heartbeat.   You notice a "clicking" in your breastbone (sternum) when you move.   You have numbness or weakness in your arms or legs.  You feel dizzy or lightheaded.  MAKE SURE YOU:  Understand these instructions.  Will watch your condition.  Will get help right away if you are not doing well or get worse. Document Released: 02/23/2005 Document Revised: 04/08/2013 Document Reviewed: 01/13/2013 [facility] Patient Information 2014 [place].  Wound Care: May shower.  Clean wounds with mild soap and water daily. Contact the office at [phone] if any problems arise.

## 2013-09-07 NOTE — Progress Notes (Signed)
Clinical Social Work Department CLINICAL SOCIAL WORK PLACEMENT NOTE 09/07/2013  Patient:  [name], [name]  Account Number:  [id number] Admit date:  09/03/2013  Clinical Social Worker:  Megan Salon  Date/time:  09/07/2013 10:40 AM  Clinical Social Work is seeking post-discharge placement for this patient at the following level of care:   SKILLED NURSING   (*CSW will update this form in Epic as items are completed)   09/07/2013  Patient/family provided with [place] Department of Clinical Social Work's list of facilities offering this level of care within the geographic area requested by the patient (or if unable, by the patient's family).  09/07/2013  Patient/family informed of their freedom to choose among providers that offer the needed level of care, that participate in Medicare, Medicaid or managed care program needed by the patient, have an available bed and are willing to accept the patient.  09/07/2013  Patient/family informed of [facility]' ownership interest in [facility], as well as of the fact that they are under no obligation to receive care at this facility.  PASARR submitted to EDS on 09/07/2013 PASARR number received from EDS on 09/07/2013  FL2 transmitted to all facilities in geographic area requested by pt/family on  09/07/2013 FL2 transmitted to all facilities within larger geographic area on   Patient informed that his/her managed care company has contracts with or will negotiate with  certain facilities, including the following:     Patient/family informed of bed offers received:   Patient chooses bed at  Physician recommends and patient chooses bed at    Patient to be transferred to  on   Patient to be transferred to facility by   The following physician request were entered in Epic:   Additional Comments:  Jeanette Caprice, MSW, [place]

## 2013-09-07 NOTE — Discharge Summary (Signed)
Physician Discharge Summary       [name].[address]       [place] [address]             [phone]    Patient ID: [name] MRN: [id number] DOB/AGE: Apr 30, 1927 80 y.o.  Admit date: 09/03/2013 Discharge date: 09/07/2013  Admission Diagnoses: 1. Multivessel CAD 2. History of hypertension 3.History of hyperlipidemia 4. History of tobacco abuse  5. History of GERD 6. History of prostate cancer  Discharge Diagnoses:  1. Multivessel CAD 2. History of hypertension 3.History of hyperlipidemia 4. History of tobacco abuse  5. History of GERD 6. History of prostate cancer 7. ABL anemia 8.Post op a fib with RVR (converted to SR)  Procedure (s):  1.Coronary artery bypass grafting x4 (left internal mammary artery to LAD, saphenous vein graft to diagonal, sequential saphenous vein graft to OM1 and OM2). 2. Endoscopic harvest of right and left leg greater saphenous vein by Dr. Prescott [name] on 09/03/2013.  History of Presenting Illness: This is an 80 year old with severe multivessel CAD and class III angina who returned on 08/26/2013 to discuss potential CABG. Patient was seen in consultation before the Christmas holidays and surgical coronary revascularization was discussed in depth with the patient and family. He has thought about the procedure, including expected benefits of improved survival, decreased risk of MI, as well as the potential risks of stroke renal failure and death. He wishes to proceed with surgery in January after he can work out the timing with his son who travels with his work. The patient denies any resting symptoms of chest pain orthopnea PND. He has dyspnea on exertion. He has fairly severe osteoarthritis of the right knee. He was scheduled for a knee replacement, but this was canceled after cardiology evaluation demonstrated positive stress test with severe three-vessel CAD. By echo, is LVEF is well preserved and there are no significant valvular disorders. He  is in a sinus rhythm. Patient had previous suprapubic prostatectomy and a  hernia repair under general anesthesia without complication. He presented to [facility] on [facility]/2013 in order to undergo a CABG x 4.  Brief Hospital Course:   The patient was extubated late the evening of surgery without difficulty. He remained afebrile and hemodynamically stable. He was weaned off of Dopamine drip.[name], a line, and chest tubes were removed early in the post operative course. Foley was removed on post op day 2 .Lopressor was started and titrated accordingly. He went into a fib with RVR. He was started on an Amiodarone drip. He converted to sinus rhythm and and maintained it. He was volume over loaded and diuresed. He was weaned off the insulin drip. He did have brief confusion which was likely related to sedatives.He did have ABL anemia. He did not require a post op transfusion. His last H and H was 10.1 and 29.3. The patient's HGA1C pre op was 5.1. The patient was felt surgically stable for transfer from the ICU to PCTU for further convalescence on 09/06/2012. He continues to progress with cardiac rehab. He/she was ambulating on room air. He has been tolerating a diet and has had a bowel movement. Epicardial pacing wires and chest tube sutures will be removed prior to discharge. Provided the patient remains afebrile, hemodynamically stable, and pending morning round evaluation, hewill be surgically stable for discharge on 09/08/2012.   Latest Vital Signs: Blood pressure 126/69, pulse 73, temperature 98 F (36.7 C), temperature source Oral, resp. rate 18, weight 78.608 kg (  173 lb 4.8 oz), SpO2 95.00%.  Physical Exam: General appearance: alert, cooperative and no distress  Heart: regular rate and rhythm  Lungs: mildly dim in L>R base  Abdomen: benign  Extremities: no edema  Wound: incis healig well   Discharge Condition:Stable  Recent laboratory studies:  Lab Results  Component Value Date   WBC  9.4 09/07/2013   HGB 10.1* 09/07/2013   HCT 29.3* 09/07/2013   MCV 93.9 09/07/2013   PLT 185 09/07/2013   Lab Results  Component Value Date   NA 134* 09/07/2013   K 4.3 09/07/2013   CL 96 09/07/2013   CO2 28 09/07/2013   CREATININE 1.20 09/07/2013   GLUCOSE 88 09/07/2013      Diagnostic Studies: Dg Chest 2 View  09/06/2013   CLINICAL DATA:  Followup atelectasis  EXAM: CHEST  2 VIEW  COMPARISON:  One hundred seventeen fifth  FINDINGS: Large hiatal hernia. Stable mild cardiac silhouette enlargement status post CABG. No pneumothorax. Bilateral mild to moderate lower lobe atelectasis is stable. Small left pleural effusion.  IMPRESSION: Larger fluid containing structure in the middle mediastinum consistent with hiatal hernia. Small left effusion. Moderate stable bilateral atelectasis.   Electronically Signed   By: Skipper Cliche M.D.   On: 09/06/2013 07:43     Future Appointments Provider Department Dept Phone   09/23/2013 12:30 PM Ivin Poot, MD [facility] [phone]   11/24/2013 8:30 AM Chipper Herb, MD [facility] [phone]      Discharge Medications:  The patient has been discharged on:   1.Beta Blocker:  Yes [   ]                              No   [   ]                              If No, reason:  2.Ace Inhibitor/ARB: Yes [   ]                                     No  [    ]                                     If No, reason:  3.Statin:   Yes [   ]                  No  [   ]                  If No, reason:  4.Ecasa:  Yes  [   ]                  No   [   ]                  If No, reason:      Medication List    STOP taking these medications       amLODipine 5 MG tablet  Commonly known as:  NORVASC     aspirin 81 MG tablet  Replaced by:  aspirin 325 MG EC tablet     hydrochlorothiazide 25 MG tablet  Commonly known as:  HYDRODIURIL     HYDROcodone-acetaminophen 5-325 MG per tablet  Commonly  known as:  NORCO/VICODIN     lisinopril 40 MG tablet  Commonly known as:  PRINIVIL,ZESTRIL      TAKE these medications       amiodarone 200 MG tablet  Commonly known as:  PACERONE  Take 1 tablet (200 mg total) by mouth 2 (two) times daily. For 7 Days, then decrease to once a day     aspirin 325 MG EC tablet  Take 1 tablet (325 mg total) by mouth daily.     beta carotene w/minerals tablet  Take 1 tablet by mouth daily.     furosemide 40 MG tablet  Commonly known as:  LASIX  Take 1 tablet (40 mg total) by mouth daily. For 7 Days     metoprolol tartrate 25 MG tablet  Commonly known as:  LOPRESSOR  Take 0.5 tablets (12.5 mg total) by mouth 2 (two) times daily.     omeprazole 40 MG capsule  Commonly known as:  PRILOSEC  Take 40 mg by mouth daily.     oxybutynin 10 MG 24 hr tablet  Commonly known as:  DITROPAN-XL  Take 1 tablet by mouth daily.     potassium chloride SA 20 MEQ tablet  Commonly known as:  K-DUR,KLOR-CON  Take 1 tablet (20 mEq total) by mouth daily. For 7 Days     pramipexole 1 MG tablet  Commonly known as:  MIRAPEX  Take 1 tablet (1 mg total) by mouth daily. Takes as directed     pravastatin 80 MG tablet  Commonly known as:  PRAVACHOL  Take 80 mg by mouth daily.     testosterone cypionate 200 MG/ML injection  Commonly known as:  DEPOTESTOTERONE CYPIONATE  Inject 1.5 mLs (300 mg total) into the muscle every 28 (twenty-eight) days.     traMADol 50 MG tablet  Commonly known as:  ULTRAM  Take 1 tablet (50 mg total) by mouth every 6 (six) hours as needed for moderate pain.     zolpidem 10 MG tablet  Commonly known as:  AMBIEN  Take 10 mg by mouth at bedtime as needed.          The patient has been discharged on:   1.Beta Blocker:  Yes [x   ]                              No   [   ]                              If No, reason:  2.Ace Inhibitor/ARB: Yes [   ]                                     No  [ x   ]                                     If  No, reason: Elevated creatinine  3.Statin:   Yes [ x  ]                  No  [   ]  If No, reason:  4.Ecasa:  Yes  [ x  ]                  No   [   ]                  If No, reason:  Follow Up Appointments:     Follow-up Information   Follow up with Sherren Mocha, MD. (Appointment time is at)    Specialty:  Cardiology   Contact information:   [address] [place] [address] [phone]       Follow up with Len Childs, MD On 09/23/2013. (PA/LAT CXR to be taken (at [facility] which is in the same building as Dr. Lucianne Lei Trigt's office) on 09/23/2013 at 11:30 am;Appointment with Dr. Prescott [name] is at 12:30 pm)    Specialty:  Cardiothoracic Surgery   Contact information:   [address] [place] [address] [phone]       Signed: Lars Pinks MPA-C 09/07/2013, 9:50 AM

## 2013-09-08 ENCOUNTER — Inpatient Hospital Stay (HOSPITAL_COMMUNITY): Payer: Medicare PPO

## 2013-09-08 LAB — BASIC METABOLIC PANEL
BUN: 18 mg/dL (ref 6–23)
CO2: 29 mEq/L (ref 19–32)
Calcium: 9.1 mg/dL (ref 8.4–10.5)
Chloride: 100 mEq/L (ref 96–112)
Creatinine, Ser: 1.19 mg/dL (ref 0.50–1.35)
GFR calc Af Amer: 63 mL/min — ABNORMAL LOW (ref >90)
GFR calc non Af Amer: 54 mL/min — ABNORMAL LOW (ref >90)
Glucose, Bld: 73 mg/dL (ref 70–99)
Potassium: 4.2 mEq/L (ref 3.7–5.3)
Sodium: 140 mEq/L (ref 137–147)

## 2013-09-08 LAB — CBC
HCT: 32.5 % — ABNORMAL LOW (ref 39.0–52.0)
Hemoglobin: 11 g/dL — ABNORMAL LOW (ref 13.0–17.0)
MCH: 32.1 pg (ref 26.0–34.0)
MCHC: 33.8 g/dL (ref 30.0–36.0)
MCV: 94.8 fL (ref 78.0–100.0)
Platelets: 263 10*3/uL (ref 150–400)
RBC: 3.43 MIL/uL — ABNORMAL LOW (ref 4.22–5.81)
RDW: 14.6 % (ref 11.5–15.5)
WBC: 9.1 10*3/uL (ref 4.0–10.5)

## 2013-09-08 MED ORDER — AMIODARONE HCL 200 MG PO TABS: 200.0000 mg | ORAL_TABLET | Freq: Two times a day (BID) | ORAL | Status: DC

## 2013-09-08 MED ORDER — FUROSEMIDE 40 MG PO TABS: 40.0000 mg | ORAL_TABLET | Freq: Every day | ORAL | Status: DC

## 2013-09-08 MED ORDER — ASPIRIN 325 MG PO TBEC: 325.0000 mg | DELAYED_RELEASE_TABLET | Freq: Every day | ORAL | Status: DC

## 2013-09-08 MED ORDER — METOPROLOL TARTRATE 25 MG PO TABS: 12.5000 mg | ORAL_TABLET | Freq: Two times a day (BID) | ORAL | Status: DC

## 2013-09-08 MED ORDER — TRAMADOL HCL 50 MG PO TABS: 50.0000 mg | ORAL_TABLET | Freq: Four times a day (QID) | ORAL | Status: DC | PRN

## 2013-09-08 MED ORDER — POTASSIUM CHLORIDE CRYS ER 20 MEQ PO TBCR: 20.0000 meq | EXTENDED_RELEASE_TABLET | Freq: Every day | ORAL | Status: DC

## 2013-09-08 NOTE — Progress Notes (Signed)
Patient taken off cardiac monitor. IV d/c'd. Patient transported to [facility] facility via EMS. [name] R

## 2013-09-08 NOTE — Progress Notes (Signed)
Clinical Social Worker facilitated patient discharge by contacting the patient and facility, [facility]. Patient agreeable to this plan and arranging transport via EMS . CSW will sign off, as social work intervention is no longer needed.  Jeanette Caprice, MSW, [place]

## 2013-09-08 NOTE — Progress Notes (Signed)
Report given to nurse at [facility]. [name]

## 2013-09-08 NOTE — Progress Notes (Addendum)
      [address]       [place] [address]             [phone]      5 Days Post-Op Procedure(s) (LRB): CORONARY ARTERY BYPASS GRAFTING times four using left internal mammary and bilateral saphenous vein. (N/A) INTRAOPERATIVE TRANSESOPHAGEAL ECHOCARDIOGRAM (N/A)  Subjective:  Mr. Siddiqi has no new complaints this morning. He is ready to be discharged.   Objective: Vital signs in last 24 hours: Temp:  [97.8 F (36.6 C)-99 F (37.2 C)] 97.8 F (36.6 C) (01/20 0500) Pulse Rate:  [70-81] 75 (01/20 0500) Cardiac Rhythm:  [-] Normal sinus rhythm (01/19 2030) Resp:  [18] 18 (01/20 0500) BP: (116-131)/(60-64) 124/64 mmHg (01/20 0500) SpO2:  [93 %-99 %] 96 % (01/20 0500) Weight:  [170 lb 3.1 oz (77.2 kg)] 170 lb 3.1 oz (77.2 kg) (01/20 0500)  Intake/Output from previous day: 01/19 0701 - 01/20 0700 In: 840 [P.O.:840] Out: 1085 [Urine:1085]  General appearance: alert, cooperative and no distress Heart: regular rate and rhythm Lungs: clear to auscultation bilaterally Abdomen: soft, non-tender; bowel sounds normal; no masses,  no organomegaly Extremities: edema none appreciated Wound: clean and dry  Lab Results:  Recent Labs  09/07/13 0310 09/08/13 0450  WBC 9.4 9.1  HGB 10.1* 11.0*  HCT 29.3* 32.5*  PLT 185 263   BMET:  Recent Labs  09/07/13 0310 09/08/13 0450  NA 134* 140  K 4.3 4.2  CL 96 100  CO2 28 29  GLUCOSE 88 73  BUN 21 18  CREATININE 1.20 1.19  CALCIUM 8.6 9.1    PT/INR: No results found for this basename: LABPROT, INR,  in the last 72 hours ABG    Component Value Date/Time   PHART 7.331* 09/03/2013 2241   HCO3 26.6* 09/03/2013 2241   TCO2 27 09/04/2013 1650   O2SAT 98.0 09/03/2013 2241   CBG (last 3)   Recent Labs  09/05/13 1644 09/05/13 2141 09/06/13 0724  GLUCAP 80 110* 81    Assessment/Plan: S/P Procedure(s) (LRB): CORONARY ARTERY BYPASS GRAFTING times four using left internal mammary and bilateral saphenous vein.  (N/A) INTRAOPERATIVE TRANSESOPHAGEAL ECHOCARDIOGRAM (N/A)  1. CV- brief episode of Atrial Fibrillation, currently NSR good rate and pressure control- Amiodarone, Lopressor 2. Pulm- no acute issues, encouraged use of IS 3. Renal- creatinine stable, mild volume overload continue Lasix 4. Deconditioning- needs SNF 5. Dispo- patient stable, will d/c EPW, plan to d/c to rehab this afternoon if no further A. Fib post wire removal   LOS: 5 days    BARRETT, ERIN 09/08/2013  patient examined and medical record reviewed,agree with above note. VAN TRIGT III,PETER 09/08/2013

## 2013-09-08 NOTE — Progress Notes (Signed)
CARDIAC REHAB PHASE I   Ed completed and pt voiced understanding. Did not discuss CRPII as pt needs TKR and cannot tolerate much ex. Set up [facility] video. [phone]  Josephina Shih [place] CES, ACSM 09/08/2013 10:46 AM

## 2013-09-08 NOTE — Progress Notes (Signed)
Clinical Social Work Department CLINICAL SOCIAL WORK PLACEMENT NOTE 09/08/2013  Patient:  [name], [name]  Account Number:  [id number] Admit date:  09/03/2013  Clinical Social Worker:  Megan Salon  Date/time:  09/07/2013 10:40 AM  Clinical Social Work is seeking post-discharge placement for this patient at the following level of care:   SKILLED NURSING   (*CSW will update this form in Epic as items are completed)   09/07/2013  Patient/family provided with [place] Department of Clinical Social Work's list of facilities offering this level of care within the geographic area requested by the patient (or if unable, by the patient's family).  09/07/2013  Patient/family informed of their freedom to choose among providers that offer the needed level of care, that participate in Medicare, Medicaid or managed care program needed by the patient, have an available bed and are willing to accept the patient.  09/07/2013  Patient/family informed of [facility]' ownership interest in [facility], as well as of the fact that they are under no obligation to receive care at this facility.  PASARR submitted to EDS on 09/07/2013 PASARR number received from EDS on 09/07/2013  FL2 transmitted to all facilities in geographic area requested by pt/family on  09/07/2013 FL2 transmitted to all facilities within larger geographic area on   Patient informed that his/her managed care company has contracts with or will negotiate with  certain facilities, including the following:     Patient/family informed of bed offers received:  09/07/2013 Patient chooses bed at [facility] Physician recommends and patient chooses bed at    Patient to be transferred to [facility] on  09/08/2013 Patient to be transferred to facility by EMS  The following physician request were entered in Epic:   Additional Comments:  Jeanette Caprice, MSW, [place]

## 2013-09-08 NOTE — Progress Notes (Signed)
Removed chest tube sutures and EPW per order. No complications, at this time patient remains in NSR. Bedrest followed per protocol. Will continue to monitor.

## 2013-09-20 NOTE — Discharge Summary (Signed)
patient examined and medical record reviewed,agree with above note. VAN TRIGT III,PETER 09/20/2013   

## 2013-09-22 ENCOUNTER — Other Ambulatory Visit: Payer: Self-pay | Admitting: *Deleted

## 2013-09-22 ENCOUNTER — Encounter: Payer: Medicare PPO | Admitting: Cardiology

## 2013-09-22 DIAGNOSIS — I2510 Atherosclerotic heart disease of native coronary artery without angina pectoris: Secondary | ICD-10-CM

## 2013-09-23 ENCOUNTER — Ambulatory Visit: Payer: Medicare PPO | Admitting: Cardiothoracic Surgery

## 2013-09-28 ENCOUNTER — Ambulatory Visit (INDEPENDENT_AMBULATORY_CARE_PROVIDER_SITE_OTHER): Payer: Self-pay | Admitting: Surgical

## 2013-09-28 ENCOUNTER — Ambulatory Visit
Admission: RE | Admit: 2013-09-28 | Discharge: 2013-09-28 | Disposition: A | Discharge status: Home / Self Care | Payer: Medicare PPO | Source: Ambulatory Visit | Attending: Cardiothoracic Surgery | Admitting: Cardiothoracic Surgery

## 2013-09-28 VITALS — BP 138/76 | HR 75 | Resp 16 | Ht 70.0 in | Wt 160.0 lb

## 2013-09-28 DIAGNOSIS — I2510 Atherosclerotic heart disease of native coronary artery without angina pectoris: Secondary | ICD-10-CM

## 2013-09-28 DIAGNOSIS — Z951 Presence of aortocoronary bypass graft: Secondary | ICD-10-CM

## 2013-09-28 NOTE — Patient Instructions (Signed)
Reviewed instructions Re: lifting , ambulating, and driving

## 2013-09-28 NOTE — Progress Notes (Signed)
[name]       [name],[name]             [phone]                  [name] [facility] #[id number] Date of Birth: Jun 22, 1927  [name], [name] [name], [name]  Chief Complaint:   PostOp Follow Up Visit   History of Present Illness:     80 year old male seen in follow-up after CabgX4 for severe L main/Multivessel CAD with Class III angina. He is doing very well. He is set for discharge from [facility] today. He is asymptomatic except mild left shoulder pain.      History  Smoking status  . Former Smoker -- 1.00 packs/day  . Types: Cigarettes  . Quit date: 08/20/1978  Smokeless tobacco  . Never Used       Allergies  Allergen Reactions  . Lipitor [Atorvastatin] Other (See Comments)    "feel bad"    Current Outpatient Prescriptions  Medication Sig Dispense Refill  . amiodarone (PACERONE) 200 MG tablet Take 1 tablet (200 mg total) by mouth 2 (two) times daily. For 7 Days, then decrease to once a day      . aspirin EC 325 MG EC tablet Take 1 tablet (325 mg total) by mouth daily.  30 tablet  0  . metoprolol tartrate (LOPRESSOR) 25 MG tablet Take 0.5 tablets (12.5 mg total) by mouth 2 (two) times daily.      Marland Kitchen omeprazole (PRILOSEC) 40 MG capsule Take 40 mg by mouth daily.      Marland Kitchen oxybutynin (DITROPAN-XL) 10 MG 24 hr tablet Take 1 tablet by mouth daily.      . pramipexole (MIRAPEX) 1 MG tablet Take 1 tablet (1 mg total) by mouth daily. Takes as directed  90 tablet  3  . pravastatin (PRAVACHOL) 80 MG tablet Take 80 mg by mouth daily.      . Testosterone 25 MG/2.5GM GEL Place 200 mg onto the skin daily.      Marland Kitchen testosterone cypionate (DEPOTESTOTERONE CYPIONATE) 200 MG/ML injection Inject 1.5 mLs (300 mg total) into the muscle every 28 (twenty-eight) days.  10 mL  1  . zolpidem (AMBIEN) 10 MG tablet Take 10 mg by mouth at bedtime as needed.      . beta carotene w/minerals (OCUVITE) tablet Take 1 tablet by mouth daily.       . traMADol (ULTRAM) 50 MG tablet Take 1 tablet (50 mg total) by mouth every 6 (six) hours as needed for moderate pain.  30 tablet  0   No current facility-administered medications for this visit.       Physical Exam: BP 138/76  Pulse 75  Resp 16  Ht 5\' 10"  (1.778 m)  Wt 160 lb (72.576 kg)  BMI 22.96 kg/m2  SpO2 98%  General appearance: alert, cooperative and no distress Heart: regular rate and rhythm Lungs: clear to auscultation bilaterally Abdomen: soft, non-tender; bowel sounds normal; no masses,  no organomegaly Extremities: no edema Wound: incis well healed Wounds:  Diagnostic Studies & Laboratory data:         Recent Radiology Findings: Dg Chest 2 View  09/28/2013   CLINICAL DATA:  Shortness of breath  EXAM: CHEST  2 VIEW  COMPARISON:  09/08/2013  FINDINGS: Cardiac shadow is stable. A large hiatal hernia is again noted. Some pleural thickening is again noted on the right  but reduced in appearance when compare with the prior exam. No focal infiltrate or sizable effusion is seen. Degenerative change of thoracic spine is noted.  IMPRESSION: Pleural thickening on the right which is improved in appearance when compared with the prior exam.  Large hiatal hernia.  No acute abnormality noted.   Electronically Signed   By: Inez Catalina M.D.   On: 09/28/2013 13:28      Recent Labs: Lab Results  Component Value Date   WBC 9.1 09/08/2013   HGB 11.0* 09/08/2013   HCT 32.5* 09/08/2013   PLT 263 09/08/2013   GLUCOSE 73 09/08/2013   CHOL 123 07/28/2013   TRIG 108 02/17/2013   LDLCALC 59 02/17/2013   ALT 16 09/01/2013   AST 21 09/01/2013   NA 140 09/08/2013   K 4.2 09/08/2013   CL 100 09/08/2013   CREATININE 1.19 09/08/2013   BUN 18 09/08/2013   CO2 29 09/08/2013   INR 1.44 09/03/2013   HGBA1C 5.2 09/01/2013      Assessment / Plan:  Doing well, reviewed lifting and driving restrictions.  He has appt to see Dr Harl Bowie for cardiology follow-up. We will see again PRN           GOLD,WAYNE E 09/28/2013 1:59 PM

## 2013-09-29 ENCOUNTER — Telehealth: Payer: Self-pay | Admitting: Cardiology

## 2013-09-29 MED ORDER — ASPIRIN 325 MG PO TBEC
325.0000 mg | DELAYED_RELEASE_TABLET | Freq: Every day | ORAL | Status: DC
Start: 2013-09-29 — End: 2014-02-25

## 2013-09-29 MED ORDER — AMIODARONE HCL 200 MG PO TABS: 200.0000 mg | ORAL_TABLET | Freq: Two times a day (BID) | ORAL | Status: DC

## 2013-09-29 MED ORDER — METOPROLOL TARTRATE 25 MG PO TABS: 12.5000 mg | ORAL_TABLET | Freq: Two times a day (BID) | ORAL | Status: DC

## 2013-09-29 NOTE — Telephone Encounter (Signed)
Amiodarone, Ultram, Lopressor

## 2013-10-01 ENCOUNTER — Encounter: Payer: Self-pay | Admitting: *Deleted

## 2013-10-13 ENCOUNTER — Encounter: Payer: Medicare PPO | Admitting: Cardiology

## 2013-10-13 NOTE — Progress Notes (Signed)
Encounter opened in error

## 2013-10-14 ENCOUNTER — Encounter: Payer: Self-pay | Admitting: Family Medicine

## 2013-10-14 ENCOUNTER — Ambulatory Visit (INDEPENDENT_AMBULATORY_CARE_PROVIDER_SITE_OTHER): Payer: Medicare PPO | Admitting: Family Medicine

## 2013-10-14 VITALS — BP 177/81 | HR 58 | Temp 97.2°F | Ht 70.0 in | Wt 159.0 lb

## 2013-10-14 DIAGNOSIS — E785 Hyperlipidemia, unspecified: Secondary | ICD-10-CM

## 2013-10-14 DIAGNOSIS — E291 Testicular hypofunction: Secondary | ICD-10-CM

## 2013-10-14 DIAGNOSIS — M1990 Unspecified osteoarthritis, unspecified site: Secondary | ICD-10-CM

## 2013-10-14 DIAGNOSIS — I10 Essential (primary) hypertension: Secondary | ICD-10-CM

## 2013-10-14 DIAGNOSIS — I25810 Atherosclerosis of coronary artery bypass graft(s) without angina pectoris: Secondary | ICD-10-CM

## 2013-10-14 LAB — POCT CBC
GRANULOCYTE PERCENT: 83.1 % — AB (ref 37–80)
HEMATOCRIT: 44.5 % (ref 43.5–53.7)
Hemoglobin: 13.5 g/dL — AB (ref 14.1–18.1)
Lymph, poc: 0.9 (ref 0.6–3.4)
MCH, POC: 29 pg (ref 27–31.2)
MCHC: 30.5 g/dL — AB (ref 31.8–35.4)
MCV: 95.4 fL (ref 80–97)
MPV: 7.2 fL (ref 0–99.8)
POC Granulocyte: 6 (ref 2–6.9)
POC LYMPH PERCENT: 12 %L (ref 10–50)
Platelet Count, POC: 318 10*3/uL (ref 142–424)
RBC: 4.7 M/uL (ref 4.69–6.13)
RDW, POC: 13.7 %
WBC: 7.2 10*3/uL (ref 4.6–10.2)

## 2013-10-14 MED ORDER — TESTOSTERONE CYPIONATE 200 MG/ML IM SOLN
300.0000 mg | Freq: Once | INTRAMUSCULAR | Status: AC
  Administered 2013-10-14: 300 mg via INTRAMUSCULAR

## 2013-10-14 NOTE — Progress Notes (Signed)
Subjective:    Patient ID: [name], male    DOB: 25-Aug-1926, 80 y.o.   MRN: [id number]  HPI Patient here today for hospital follow up and rehabilitation. The bypass was done in January 2015. He had a 20 day stay at a long-term care facility. He is alert been receiving care at home by home health nurses. He is living by himself. He indicates he has been doing well. He complains of his mouth being somewhat dry.      Patient Active Problem List   Diagnosis Date Noted  . S/P CABG x 4 09/03/2013  . Insomnia 07/27/2013  . Testosterone deficiency 07/27/2013  . Osteoarthritis of right knee 07/27/2013  . Hyperlipidemia 06/10/2013  . Heart murmur 06/10/2013  . Dyspnea 06/10/2013  . Inguinal hernia, left. 04/04/2012  . Paraesophageal hiatal hernia 03/24/2012  . Prostate cancer 01/15/2012  . Special screening for malignant neoplasms, colon 01/15/2012   Outpatient Encounter Prescriptions as of 10/14/2013  Medication Sig  . amiodarone (PACERONE) 200 MG tablet Take 200 mg by mouth daily. For 7 Days, then decrease to once a day  . aspirin 325 MG EC tablet Take 1 tablet (325 mg total) by mouth daily.  . metoprolol tartrate (LOPRESSOR) 25 MG tablet Take 0.5 tablets (12.5 mg total) by mouth 2 (two) times daily.  Marland Kitchen omeprazole (PRILOSEC) 40 MG capsule Take 40 mg by mouth daily.  Marland Kitchen oxybutynin (DITROPAN-XL) 10 MG 24 hr tablet Take 2 tablets by mouth daily.   . pramipexole (MIRAPEX) 1 MG tablet Take 1 tablet (1 mg total) by mouth daily. Takes as directed  . testosterone cypionate (DEPOTESTOTERONE CYPIONATE) 200 MG/ML injection Inject 1.5 mLs (300 mg total) into the muscle every 28 (twenty-eight) days.  . traMADol (ULTRAM) 50 MG tablet Take 1 tablet (50 mg total) by mouth every 6 (six) hours as needed for moderate pain.  Marland Kitchen zolpidem (AMBIEN) 10 MG tablet Take 10 mg by mouth at bedtime as needed.  . [DISCONTINUED] amiodarone (PACERONE) 200 MG tablet Take 1 tablet (200 mg total) by mouth 2 (two) times  daily. For 7 Days, then decrease to once a day  . [DISCONTINUED] beta carotene w/minerals (OCUVITE) tablet Take 1 tablet by mouth daily.  . [DISCONTINUED] pravastatin (PRAVACHOL) 80 MG tablet Take 80 mg by mouth daily.  . [DISCONTINUED] Testosterone 25 MG/2.5GM GEL Place 200 mg onto the skin daily.    Review of Systems  Constitutional: Negative.   HENT: Positive for congestion.   Eyes: Negative.   Respiratory: Positive for cough.   Cardiovascular: Negative.   Gastrointestinal: Negative.   Endocrine: Negative.   Genitourinary: Negative.   Musculoskeletal: Negative.   Skin: Negative.   Allergic/Immunologic: Negative.   Neurological: Negative.   Hematological: Negative.   Psychiatric/Behavioral: Negative.        Objective:   Physical Exam  Nursing note and vitals reviewed. Constitutional: He is oriented to person, place, and time. He appears well-developed and well-nourished. No distress.  The patient is pleasant and looks extremely well considering having bypass surgery at little over one month ago.  HENT:  Head: Normocephalic and atraumatic.  Right Ear: External ear normal.  Left Ear: External ear normal.  Nose: Nose normal.  Mouth/Throat: Oropharynx is clear and moist. No oropharyngeal exudate.  The tongue is somewhat dry there  Eyes: Conjunctivae and EOM are normal. Pupils are equal, round, and reactive to light. Right eye exhibits no discharge. Left eye exhibits no discharge. No scleral icterus.  Neck: Normal range  of motion. Neck supple. No thyromegaly present.  No carotid bruits  Cardiovascular: Normal rate, regular rhythm, normal heart sounds and intact distal pulses.  Exam reveals no gallop and no friction rub.   No murmur heard. At 72 per minute  Pulmonary/Chest: Effort normal and breath sounds normal. No respiratory distress. He has no wheezes. He has no rales. He exhibits no tenderness.  There were a few rhonchi with initial breathing efforts at first but once he  coughed these rhonchi cleared out.  Abdominal: Soft. Bowel sounds are normal. He exhibits no mass. There is no tenderness. There is no rebound and no guarding.  Musculoskeletal: Normal range of motion. He exhibits no edema and no tenderness.  Lymphadenopathy:    He has no cervical adenopathy.  Neurological: He is alert and oriented to person, place, and time.  Skin: Skin is warm and dry. No rash noted. No erythema. No pallor.  The chest Bornemann appeared well healed and the bypass graft extraction sites also appeared well healed  Psychiatric: He has a normal mood and affect. His behavior is normal. Judgment and thought content normal.   BP 177/81  Pulse 58  Temp(Src) 97.2 F (36.2 C) (Oral)  Ht 5\' 10"  (1.778 m)  Wt 159 lb (72.122 kg)  BMI 22.81 kg/m2        Assessment & Plan:  1. Hypogonadism, male - testosterone cypionate (DEPOTESTOTERONE CYPIONATE) injection 300 mg; Inject 1.5 mLs (300 mg total) into the muscle once.  2. Hyperlipidemia  3. Hypertension  4. CAD (coronary artery disease) of artery bypass graft  5. Osteoarthritis  Patient Instructions  Drink plenty of fluids Be careful and did not put yourself at risk for falling Practice deep breathing exercises several times a day Take Mucinex maximum strength one twice daily with a large glass of water. It is blue and white in color an over-the-counter. Use a cool mist humidifier in her bedroom at nighttime Call back if you start running any fever or coughing up yellow sputum Please consider getting a Lifeline for use at home   Arrie Senate MD

## 2013-10-14 NOTE — Patient Instructions (Addendum)
Drink plenty of fluids Be careful and did not put yourself at risk for falling Practice deep breathing exercises several times a day Take Mucinex maximum strength one twice daily with a large glass of water. It is blue and white in color an over-the-counter. Use a cool mist humidifier in her bedroom at nighttime Call back if you start running any fever or coughing up yellow sputum Please consider getting a Lifeline for use at home

## 2013-10-14 NOTE — Addendum Note (Signed)
Addended by: Pollyann Kennedy F on: 10/14/2013 05:30 PM   Modules accepted: Orders

## 2013-10-15 ENCOUNTER — Ambulatory Visit: Payer: Medicare PPO | Admitting: Family Medicine

## 2013-10-18 ENCOUNTER — Other Ambulatory Visit: Payer: Self-pay | Admitting: Cardiology

## 2013-10-22 ENCOUNTER — Encounter: Payer: Self-pay | Admitting: Cardiology

## 2013-10-22 ENCOUNTER — Ambulatory Visit (INDEPENDENT_AMBULATORY_CARE_PROVIDER_SITE_OTHER): Payer: Medicare PPO | Admitting: Cardiology

## 2013-10-22 VITALS — BP 160/90 | HR 61 | Ht 71.0 in | Wt 161.0 lb

## 2013-10-22 DIAGNOSIS — I10 Essential (primary) hypertension: Secondary | ICD-10-CM

## 2013-10-22 DIAGNOSIS — E785 Hyperlipidemia, unspecified: Secondary | ICD-10-CM

## 2013-10-22 DIAGNOSIS — I4891 Unspecified atrial fibrillation: Secondary | ICD-10-CM

## 2013-10-22 DIAGNOSIS — Z951 Presence of aortocoronary bypass graft: Secondary | ICD-10-CM

## 2013-10-22 NOTE — Patient Instructions (Addendum)
Your physician recommends that you schedule a follow-up appointment in: 4 month with Dr. Harl Bowie. You should receive a letter in the mail in 2 months. If you do not receive this letter by May 2015 call our office to schedule this appointment.   Your physician has recommended you make the following change in your medication:  Stop Amiodarone  Your physician has recommended you monitor and record you blood pressure for 1 week. You may bring, mail, or call office with the results.

## 2013-10-22 NOTE — Progress Notes (Signed)
Clinical Summary [name] is a 80 y.o.male seen today for follow up of the following medical problems.  1. CAD - seen originally preoperative for knee surgery, unable to assess functional capacity by history. He had been having some dyspnea on exertion as well. Referred for stress test to further risk stratify - stress MPI which showed evidence of ischemia, but was judged as a low risk study. LVEF 55-60% by echo. - follow up cath showed 07/2013 showed sevre LAD and LCX disease, mild-mod RCA disease. Overall high risk anatomy, referred to CT surgery.  - 4 vessel CABG 08/2013 (LIMA-LAD, SVG to diag, sequential SVGs to OM1 and OM2.   - sice discharge denies any chest pain, has some SOB at times that is improving.  - compliant with meds  2. Afib - postop afib after CABG, started on amio gtt with conversion to sinus rhythm.  - denies any palpitations, he has been on amio since discharge.   3 . HTN  - checks at home occasionally but does not recall numbers  - compliant with meds   4. HL  - compliant with statin. Reports "feeling bad" on lipitor, tolerates pravastatin well.  - followed by PCP  02/2013 panel: TC 123 TG 108 HDL 29 LDL 59   Past Medical History  Diagnosis Date  . Prostate cancer   . GERD (gastroesophageal reflux disease)   . Hypertension   . Hyperlipemia   . Arthritis   . Inguinal hernia     left     Allergies  Allergen Reactions  . Lipitor [Atorvastatin] Other (See Comments)    "feel bad"     Current Outpatient Prescriptions  Medication Sig Dispense Refill  . amiodarone (PACERONE) 200 MG tablet Take 200 mg by mouth daily. For 7 Days, then decrease to once a day      . amiodarone (PACERONE) 200 MG tablet TAKE 1 TABLET (200 MG TOTAL)  BY MOUTH 2 (TWO) TIMES DAILY. FOR 7DAYS,  THEN DECREASE TO ONE TABLET DAILY  30 tablet  1  . aspirin 325 MG EC tablet Take 1 tablet (325 mg total) by mouth daily.  15 tablet  0  . metoprolol tartrate (LOPRESSOR) 25 MG tablet  Take 0.5 tablets (12.5 mg total) by mouth 2 (two) times daily.  30 tablet  0  . omeprazole (PRILOSEC) 40 MG capsule Take 40 mg by mouth daily.      Marland Kitchen oxybutynin (DITROPAN-XL) 10 MG 24 hr tablet Take 2 tablets by mouth daily.       . pramipexole (MIRAPEX) 1 MG tablet Take 1 tablet (1 mg total) by mouth daily. Takes as directed  90 tablet  3  . testosterone cypionate (DEPOTESTOTERONE CYPIONATE) 200 MG/ML injection Inject 1.5 mLs (300 mg total) into the muscle every 28 (twenty-eight) days.  10 mL  1  . traMADol (ULTRAM) 50 MG tablet Take 1 tablet (50 mg total) by mouth every 6 (six) hours as needed for moderate pain.  30 tablet  0  . zolpidem (AMBIEN) 10 MG tablet Take 10 mg by mouth at bedtime as needed.       No current facility-administered medications for this visit.     Past Surgical History  Procedure Laterality Date  . Hernia repair    . Prostate surgery    . Colonoscopy    . Coronary artery bypass graft N/A 09/03/2013    Procedure: CORONARY ARTERY BYPASS GRAFTING times four using left internal mammary and bilateral saphenous vein.;  Surgeon: Ivin Poot, MD;  Location: [facility];  Service: Open Heart Surgery;  Laterality: N/A;  . Intraoperative transesophageal echocardiogram N/A 09/03/2013    Procedure: INTRAOPERATIVE TRANSESOPHAGEAL ECHOCARDIOGRAM;  Surgeon: Ivin Poot, MD;  Location: [facility];  Service: Open Heart Surgery;  Laterality: N/A;     Allergies  Allergen Reactions  . Lipitor [Atorvastatin] Other (See Comments)    "feel bad"      Family History  Problem Relation Age of Onset  . Colon cancer Neg Hx      Social History [name] reports that he quit smoking about 35 years ago. His smoking use included Cigarettes. He smoked 1.00 pack per day. He has never used smokeless tobacco. [name] reports that he does not drink alcohol.   Review of Systems CONSTITUTIONAL: No weight loss, fever, chills, weakness or fatigue.  HEENT: Eyes: No visual loss, blurred vision,  double vision or yellow sclerae.No hearing loss, sneezing, congestion, runny nose or sore throat.  SKIN: No rash or itching.  CARDIOVASCULAR: per HPI RESPIRATORY: SOB at times GASTROINTESTINAL: No anorexia, nausea, vomiting or diarrhea. No abdominal pain or blood.  GENITOURINARY: No burning on urination, no polyuria NEUROLOGICAL: No headache, dizziness, syncope, paralysis, ataxia, numbness or tingling in the extremities. No change in bowel or bladder control.  MUSCULOSKELETAL: No muscle, back pain, joint pain or stiffness.  LYMPHATICS: No enlarged nodes. No history of splenectomy.  PSYCHIATRIC: No history of depression or anxiety.  ENDOCRINOLOGIC: No reports of sweating, cold or heat intolerance. No polyuria or polydipsia.  Marland Kitchen   Physical Examination p 61 bp 160/90 Wt 161 lbs BMI 22 Gen: resting comfortably, no acute distress HEENT: no scleral icterus, pupils equal round and reactive, no palptable cervical adenopathy,  CV: RRR, no m/r/g, no JVD, no carotid bruits Resp: Clear to auscultation bilaterally GI: abdomen is soft, non-tender, non-distended, normal bowel sounds, no hepatosplenomegaly MSK: extremities are warm, no edema.  Skin: warm, no rash Neuro:  no focal deficits Psych: appropriate affect   Diagnostic Studies 06/17/13 Echo: LVEF 55-60%, indeterminate diastolic function with increased LA pressures, mild aortic stenosis (AVA 1.77 by VTI), mild MR, mild RAE   07/13/13 Lexiscan MPI  Small to moderate sized apical defect partially reversible, apical anteroseptal [name] partially reversible. LVEF 71%. Technically difficult study.   07/2013 [facility] Procedural Findings:  Hemodynamics:  AO 111/54  LV 110/11  Coronary angiography:  Coronary dominance: right  Left mainstem: Proximal and mid-LM are patent. There is distal LM disease involving the ostia of the LCx and LAD - appears nonobstructive but hypodense, less than 50%  Left anterior descending (LAD): The ostial LAD has 40-50%  stenosis. The mid-LAD just after the first diagonal has 80-90% eccentric stenosis. The first diagonal is patent and moderate in caliber.  Left circumflex (LCx): The LCx is codominant. The entire proximal vessel is diffusely diseased with 75% stenosis. The mid-LCx at the rist OM has 80-90% stenosis. The first OM origin has 75% stenosis. The mid-LCx after the second OM has diffuse 90% stenosis prior to 3 distal OM branches.  Right coronary artery (RCA): Codominant vessel. Ostial 30-40% stenosis, mid 50% stenosis, PDA patent.  Left ventriculography: deferred  Final Conclusions:  1. Severe 2 vessel CAD involving the mid-LAD and entire proximal and mid-LCx  2. Mild-moderate RCA stenosis  3. Known normal LV function (Myoview scan with LVEF 71%)  Recommendations: Difficult situation in patient with minimal symptoms but high-risk coronary anatomy who need knee replacement. Reviewed films with colleagues and agree  his obstructive CAD      Assessment and Plan  1. CAD - recent CABG as described above, denies any current symptoms - continue current meds  2. Afib -postop afib, no prior history.  - will stop amiodarone  3. HTN - elevated in clinic today, previously well controlled - pt will keep bp log and return in 1 week.   4. Hyperlipidemia  continue current statin.       Arnoldo Lenis, M.D., F.A.C.C.

## 2013-10-30 ENCOUNTER — Telehealth: Payer: Self-pay | Admitting: Cardiology

## 2013-10-30 NOTE — Telephone Encounter (Signed)
Patient states that he has been taking Amiodarone and he is almost out.  He has no refills on the bottle.  Does he continue to take this medication. If so can we call in refills.

## 2013-10-30 NOTE — Telephone Encounter (Signed)
Called and informed pt to stop amiodarone. When MD seen pt on 10-22-13 he requested pt stop this medicine then. Pt also stated that he wants to know if he can split wood and if he can have his knee operated on?

## 2013-11-01 NOTE — Telephone Encounter (Signed)
CT surgery notes says can be considered for knee surgery 3 months after surgery, that would be April.  From CT surgery note 09/2103 activity limitations were discussed, he needs to get in touch with them if he is unclear on accepted activities.    Carlyle Dolly MD

## 2013-11-02 ENCOUNTER — Telehealth: Payer: Self-pay | Admitting: Cardiology

## 2013-11-02 NOTE — Telephone Encounter (Signed)
Pt informed of below and verbalized understanding.  

## 2013-11-02 NOTE — Telephone Encounter (Signed)
LM for pt to returncall

## 2013-11-02 NOTE — Telephone Encounter (Signed)
Pt brought in BP log. Placed BP log in MD basket

## 2013-11-05 ENCOUNTER — Ambulatory Visit: Payer: Medicare PPO

## 2013-11-11 ENCOUNTER — Telehealth: Payer: Self-pay | Admitting: Cardiology

## 2013-11-11 NOTE — Telephone Encounter (Signed)
Pt informed and verbalized understanding

## 2013-11-11 NOTE — Telephone Encounter (Signed)
Message copied by Lewayne Bunting on Wed Nov 11, 2013 12:11 PM ------      Message from: [place] F      Created: Tue Nov 10, 2013  1:59 PM       Please let patient know that bp log looks good.             Carlyle Dolly MD ------

## 2013-11-24 ENCOUNTER — Ambulatory Visit: Payer: Medicare PPO | Admitting: Family Medicine

## 2013-12-01 ENCOUNTER — Ambulatory Visit (INDEPENDENT_AMBULATORY_CARE_PROVIDER_SITE_OTHER): Payer: Medicare PPO | Admitting: Family Medicine

## 2013-12-01 ENCOUNTER — Ambulatory Visit (INDEPENDENT_AMBULATORY_CARE_PROVIDER_SITE_OTHER): Payer: Medicare PPO

## 2013-12-01 ENCOUNTER — Encounter: Payer: Self-pay | Admitting: Family Medicine

## 2013-12-01 VITALS — BP 160/82 | HR 58 | Temp 97.3°F | Ht 70.0 in | Wt 158.0 lb

## 2013-12-01 DIAGNOSIS — E349 Endocrine disorder, unspecified: Secondary | ICD-10-CM

## 2013-12-01 DIAGNOSIS — L84 Corns and callosities: Secondary | ICD-10-CM

## 2013-12-01 DIAGNOSIS — R42 Dizziness and giddiness: Secondary | ICD-10-CM

## 2013-12-01 DIAGNOSIS — R0602 Shortness of breath: Secondary | ICD-10-CM

## 2013-12-01 DIAGNOSIS — H6123 Impacted cerumen, bilateral: Secondary | ICD-10-CM

## 2013-12-01 DIAGNOSIS — IMO0002 Reserved for concepts with insufficient information to code with codable children: Secondary | ICD-10-CM

## 2013-12-01 DIAGNOSIS — R229 Localized swelling, mass and lump, unspecified: Secondary | ICD-10-CM

## 2013-12-01 DIAGNOSIS — E785 Hyperlipidemia, unspecified: Secondary | ICD-10-CM

## 2013-12-01 DIAGNOSIS — H6120 Impacted cerumen, unspecified ear: Secondary | ICD-10-CM

## 2013-12-01 DIAGNOSIS — E291 Testicular hypofunction: Secondary | ICD-10-CM

## 2013-12-01 DIAGNOSIS — C61 Malignant neoplasm of prostate: Secondary | ICD-10-CM

## 2013-12-01 DIAGNOSIS — E559 Vitamin D deficiency, unspecified: Secondary | ICD-10-CM

## 2013-12-01 MED ORDER — TESTOSTERONE CYPIONATE 200 MG/ML IM SOLN
300.0000 mg | INTRAMUSCULAR | Status: DC
  Administered 2013-12-01: 300 mg via INTRAMUSCULAR

## 2013-12-01 NOTE — Patient Instructions (Addendum)
Medicare Annual Wellness Visit  [facility] and the medical providers at [facility] strive to bring you the best medical care.  In doing so we not only want to address your current medical conditions and concerns but also to detect new conditions early and prevent illness, disease and health-related problems.    Medicare offers a yearly Wellness Visit which allows our clinical staff to assess your need for preventative services including immunizations, lifestyle education, counseling to decrease risk of preventable diseases and screening for fall risk and other medical concerns.    This visit is provided free of charge (no copay) for all Medicare recipients. The clinical pharmacists at [facility] have begun to conduct these Wellness Visits which will also include a thorough review of all your medications.    As you primary medical provider recommend that you make an appointment for your Annual Wellness Visit if you have not done so already this year.  You may set up this appointment before you leave today or you may call back ([phone]) and schedule an appointment.  Please make sure when you call that you mention that you are scheduling your Annual Wellness Visit with the clinical pharmacist so that the appointment may be made for the proper length of time.     Continue current medications. Continue good therapeutic lifestyle changes which include good diet and exercise. Fall precautions discussed with patient. If an FOBT was given today- please return it to our front desk. If you are over 55 years old - you may need Prevnar 94 or the adult Pneumonia vaccine. Use DEBROX drops OTC- -then we will wash ears out in 2 weeks  Make a special effort to try to reduce Ambien 2-1/2 of a pill nightly as this medication has a lot of side effects for people as they get older  We will attempt to arrange a visit with a cardiologist here rather  than in [place] at patient's request We will also schedule you for an evaluation of the clot in the leg and recheck this in 2 weeks Use a corn pad on the bottom of the left foot for the callus

## 2013-12-01 NOTE — Progress Notes (Signed)
Subjective:    Patient ID: [name], male    DOB: [birth year], 80 y.o.   MRN: [id number]  HPI Pt here for follow up and management of chronic medical problems. The patient does complain of some problems in his left knee. He requested that we sign a handicap placard form.: Health maintenance issues he will be due to get his lab work and a couple of weeks. He will be given FOBT to return at that time. He is also due for a cardiovascular visit recheck. He is also on testosterone injections 300 mg every 28 days. The patient does complain of some dizziness and shortness of breath. He also has a history of wax. He is scheduled to be visit the cardiologist sometime in July but would like to have a closer appointment if possible he will receive his testosterone injection today.         Patient Active Problem List   Diagnosis Date Noted  . S/P CABG x 4 09/03/2013  . Insomnia 07/27/2013  . Testosterone deficiency 07/27/2013  . Osteoarthritis of right knee 07/27/2013  . Hyperlipidemia 06/10/2013  . Heart murmur 06/10/2013  . Dyspnea 06/10/2013  . Inguinal hernia, left. 04/04/2012  . Paraesophageal hiatal hernia 03/24/2012  . Prostate cancer 01/15/2012  . Special screening for malignant neoplasms, colon 01/15/2012   Outpatient Encounter Prescriptions as of 12/01/2013  Medication Sig  . aspirin 325 MG EC tablet Take 1 tablet (325 mg total) by mouth daily.  . metoprolol tartrate (LOPRESSOR) 25 MG tablet Take 0.5 tablets (12.5 mg total) by mouth 2 (two) times daily.  Marland Kitchen omeprazole (PRILOSEC) 40 MG capsule Take 40 mg by mouth daily.  Marland Kitchen oxybutynin (DITROPAN-XL) 10 MG 24 hr tablet Take 2 tablets by mouth daily.   . pramipexole (MIRAPEX) 1 MG tablet Take 1 tablet (1 mg total) by mouth daily. Takes as directed  . testosterone cypionate (DEPOTESTOTERONE CYPIONATE) 200 MG/ML injection Inject 1.5 mLs (300 mg total) into the muscle every 28 (twenty-eight) days.  Marland Kitchen zolpidem (AMBIEN) 10 MG tablet Take 10  mg by mouth at bedtime as needed.  . [DISCONTINUED] traMADol (ULTRAM) 50 MG tablet Take 1 tablet (50 mg total) by mouth every 6 (six) hours as needed for moderate pain.    Review of Systems  Constitutional: Negative.   HENT: Negative.   Eyes: Negative.   Respiratory: Negative.   Cardiovascular: Negative.   Gastrointestinal: Negative.   Endocrine: Negative.   Genitourinary: Negative.   Musculoskeletal: Positive for arthralgias (knee pain  AND knot near incison on inner left leg).  Skin: Negative.   Allergic/Immunologic: Negative.   Neurological: Negative.   Hematological: Negative.   Psychiatric/Behavioral: Negative.        Objective:   Physical Exam  Nursing note and vitals reviewed. Constitutional: He is oriented to person, place, and time. He appears well-developed and well-nourished. No distress.  HENT:  Head: Normocephalic and atraumatic.  Mouth/Throat: Oropharynx is clear and moist. No oropharyngeal exudate.  Nasal congestion left greater than right Bilateral ear cerumen, unable to remove with curette due to the depth of the cerumen  Eyes: Conjunctivae and EOM are normal. Pupils are equal, round, and reactive to light. Right eye exhibits no discharge. Left eye exhibits no discharge. No scleral icterus.  Neck: Normal range of motion. Neck supple. No thyromegaly present.  No carotid bruits  Cardiovascular: Normal rate, regular rhythm, normal heart sounds and intact distal pulses.  Exam reveals no gallop and no friction rub.   No  murmur heard. Regular rate and rhythm at 60 per minute  Pulmonary/Chest: Effort normal and breath sounds normal. No respiratory distress. He has no wheezes. He has no rales.  No axillary nodes. Good breath sounds anteriorly and posteriorly  Abdominal: Soft. Bowel sounds are normal. He exhibits no mass. There is no tenderness. There is no rebound and no guarding.  No inguinal nodes  Musculoskeletal: Normal range of motion. He exhibits no edema and  no tenderness.  There is a 1 inch diameter lot above the medial joint line of the left knee proximal to the incision where the bypass graft was removed. This is mobile and there is minimal tenderness. There is no erythema. Homans sign is negative.  Lymphadenopathy:    He has no cervical adenopathy.  Neurological: He is alert and oriented to person, place, and time. He has normal reflexes. No cranial nerve deficit.  Skin: Skin is warm and dry. No rash noted. No erythema. No pallor.  Lump As mentioned above under musculoskeletal  Psychiatric: He has a normal mood and affect. His behavior is normal. Judgment and thought content normal.   BP 160/82  Pulse 58  Temp(Src) 97.3 F (36.3 C) (Oral)  Ht '5\' 10"'  (1.778 m)  Wt 158 lb (71.668 kg)  BMI 22.67 kg/m2   [facility] reading (PRIMARY) by  Dr.Kathyrn Warmuth-chest x-ray-  kyphosis and recent CABG surgery, otherwise no active disease                                    Assessment & Plan:  1. Hyperlipidemia - POCT CBC; Future - BMP8+EGFR; Future - Hepatic function panel; Future - NMR, lipoprofile; Future  2. Prostate cancer - POCT CBC; Future - POCT UA - Microscopic Only; Future - POCT urinalysis dipstick; Future - Testosterone,Free and Total; Future  3. Testosterone deficiency - POCT CBC; Future - POCT UA - Microscopic Only; Future - POCT urinalysis dipstick; Future - Testosterone,Free and Total; Future - testosterone cypionate (DEPOTESTOTERONE CYPIONATE) injection 300 mg; Inject 1.5 mLs (300 mg total) into the muscle every 28 (twenty-eight) days.  4. Vitamin D deficiency - Vit D  25 hydroxy (rtn osteoporosis monitoring); Future  5. SOB (shortness of breath) - Brain natriuretic peptide - DG Chest 2 View; Future  6. Callus of foot -Use corn pad  7. Dizziness and giddiness -Continue to monitor  8. Impacted cerumen of both ears -debrox  9. Lump, medial left knee -Radiological evaluation and recheck in 2 weeks  Patient Instructions                        Medicare Annual Wellness Visit  [facility] and the medical providers at [facility] strive to bring you the best medical care.  In doing so we not only want to address your current medical conditions and concerns but also to detect new conditions early and prevent illness, disease and health-related problems.    Medicare offers a yearly Wellness Visit which allows our clinical staff to assess your need for preventative services including immunizations, lifestyle education, counseling to decrease risk of preventable diseases and screening for fall risk and other medical concerns.    This visit is provided free of charge (no copay) for all Medicare recipients. The clinical pharmacists at [facility] have begun to conduct these Wellness Visits which will also include a thorough review of all your medications.  As you primary medical provider recommend that you make an appointment for your Annual Wellness Visit if you have not done so already this year.  You may set up this appointment before you leave today or you may call back ([phone]) and schedule an appointment.  Please make sure when you call that you mention that you are scheduling your Annual Wellness Visit with the clinical pharmacist so that the appointment may be made for the proper length of time.     Continue current medications. Continue good therapeutic lifestyle changes which include good diet and exercise. Fall precautions discussed with patient. If an FOBT was given today- please return it to our front desk. If you are over 41 years old - you may need Prevnar 56 or the adult Pneumonia vaccine. Use DEBROX drops OTC- -then we will wash ears out in 2 weeks  Make a special effort to try to reduce Ambien 2-1/2 of a pill nightly as this medication has a lot of side effects for people as they get older  We will attempt to arrange a visit with a cardiologist here rather  than in [place] at patient's request We will also schedule you for an evaluation of the clot in the leg and recheck this in 2 weeks Use a corn pad on the bottom of the left foot for the callus   Arrie Senate MD

## 2013-12-02 LAB — BRAIN NATRIURETIC PEPTIDE: BNP: 82.6 pg/mL (ref 0.0–100.0)

## 2013-12-04 ENCOUNTER — Telehealth: Payer: Self-pay | Admitting: Family Medicine

## 2013-12-04 NOTE — Telephone Encounter (Signed)
Aware of results. 

## 2013-12-04 NOTE — Telephone Encounter (Signed)
Message copied by Cline Crock on Fri Dec 04, 2013 10:43 AM ------      Message from: Chipper Herb      Created: Wed Dec 02, 2013  2:39 PM       The BNP is within normal limits and this basis shortness of breath is most likely not related to any congestive heart failure ------

## 2013-12-04 NOTE — Telephone Encounter (Signed)
Message copied by Shelbie Ammons on Fri Dec 04, 2013  4:04 PM ------      Message from: Chipper Herb      Created: Wed Dec 02, 2013  2:39 PM       The BNP is within normal limits and this basis shortness of breath is most likely not related to any congestive heart failure ------

## 2013-12-09 ENCOUNTER — Other Ambulatory Visit: Payer: Medicare PPO

## 2013-12-09 NOTE — Progress Notes (Signed)
Patient dropped off fobt 

## 2013-12-10 ENCOUNTER — Ambulatory Visit (HOSPITAL_COMMUNITY)
Admission: RE | Admit: 2013-12-10 | Discharge: 2013-12-10 | Disposition: A | Discharge status: Home / Self Care | Payer: Medicare PPO | Source: Ambulatory Visit | Attending: Family Medicine | Admitting: Family Medicine

## 2013-12-10 DIAGNOSIS — IMO0002 Reserved for concepts with insufficient information to code with codable children: Secondary | ICD-10-CM

## 2013-12-10 DIAGNOSIS — M7120 Synovial cyst of popliteal space [Baker], unspecified knee: Secondary | ICD-10-CM | POA: Insufficient documentation

## 2013-12-10 DIAGNOSIS — R229 Localized swelling, mass and lump, unspecified: Secondary | ICD-10-CM | POA: Insufficient documentation

## 2013-12-11 LAB — FECAL OCCULT BLOOD, IMMUNOCHEMICAL: FECAL OCCULT BLD: POSITIVE — AB

## 2013-12-14 ENCOUNTER — Other Ambulatory Visit (INDEPENDENT_AMBULATORY_CARE_PROVIDER_SITE_OTHER): Payer: Medicare PPO

## 2013-12-14 ENCOUNTER — Other Ambulatory Visit: Payer: Self-pay | Admitting: *Deleted

## 2013-12-14 DIAGNOSIS — E349 Endocrine disorder, unspecified: Secondary | ICD-10-CM

## 2013-12-14 DIAGNOSIS — E559 Vitamin D deficiency, unspecified: Secondary | ICD-10-CM

## 2013-12-14 DIAGNOSIS — E785 Hyperlipidemia, unspecified: Secondary | ICD-10-CM

## 2013-12-14 DIAGNOSIS — C61 Malignant neoplasm of prostate: Secondary | ICD-10-CM

## 2013-12-14 DIAGNOSIS — M7120 Synovial cyst of popliteal space [Baker], unspecified knee: Secondary | ICD-10-CM

## 2013-12-14 DIAGNOSIS — E291 Testicular hypofunction: Secondary | ICD-10-CM

## 2013-12-14 LAB — POCT CBC
Granulocyte percent: 85.3 %G — AB (ref 37–80)
HCT, POC: 46.6 % (ref 43.5–53.7)
Hemoglobin: 14.5 g/dL (ref 14.1–18.1)
LYMPH, POC: 1.1 (ref 0.6–3.4)
MCH, POC: 28.2 pg (ref 27–31.2)
MCHC: 31.1 g/dL — AB (ref 31.8–35.4)
MCV: 90.7 fL (ref 80–97)
MPV: 7.8 fL (ref 0–99.8)
PLATELET COUNT, POC: 271 10*3/uL (ref 142–424)
POC Granulocyte: 6.7 (ref 2–6.9)
POC LYMPH PERCENT: 13.9 %L (ref 10–50)
RBC: 5.1 M/uL (ref 4.69–6.13)
RDW, POC: 14.9 %
WBC: 7.9 10*3/uL (ref 4.6–10.2)

## 2013-12-14 LAB — POCT URINALYSIS DIPSTICK
BILIRUBIN UA: NEGATIVE
Blood, UA: NEGATIVE
GLUCOSE UA: NEGATIVE
Ketones, UA: NEGATIVE
LEUKOCYTES UA: NEGATIVE
NITRITE UA: NEGATIVE
Protein, UA: NEGATIVE
Spec Grav, UA: 1.01
Urobilinogen, UA: NEGATIVE
pH, UA: 7.5

## 2013-12-14 LAB — POCT UA - MICROSCOPIC ONLY
Bacteria, U Microscopic: NEGATIVE
Casts, Ur, LPF, POC: NEGATIVE
Crystals, Ur, HPF, POC: NEGATIVE
EPITHELIAL CELLS, URINE PER MICROSCOPY: NEGATIVE
Mucus, UA: NEGATIVE
RBC, urine, microscopic: NEGATIVE
WBC, UR, HPF, POC: NEGATIVE
YEAST UA: NEGATIVE

## 2013-12-14 NOTE — Progress Notes (Signed)
Pt came in for labs only 

## 2013-12-15 LAB — BMP8+EGFR
BUN/Creatinine Ratio: 10 (ref 10–22)
BUN: 12 mg/dL (ref 8–27)
CALCIUM: 9.8 mg/dL (ref 8.6–10.2)
CO2: 23 mmol/L (ref 18–29)
CREATININE: 1.25 mg/dL (ref 0.76–1.27)
Chloride: 98 mmol/L (ref 97–108)
GFR calc Af Amer: 61 mL/min/{1.73_m2} (ref >59)
GFR calc non Af Amer: 53 mL/min/{1.73_m2} — ABNORMAL LOW (ref >59)
GLUCOSE: 80 mg/dL (ref 65–99)
Potassium: 4.4 mmol/L (ref 3.5–5.2)
SODIUM: 137 mmol/L (ref 134–144)

## 2013-12-15 LAB — NMR, LIPOPROFILE
Cholesterol: 175 mg/dL (ref <200)
HDL CHOLESTEROL BY NMR: 42 mg/dL (ref >=40)
HDL Particle Number: 26.2 umol/L — ABNORMAL LOW (ref >=30.5)
LDL Particle Number: 1192 nmol/L — ABNORMAL HIGH (ref <1000)
LDL Size: 21 nm (ref >20.5)
LDLC SERPL CALC-MCNC: 105 mg/dL — ABNORMAL HIGH (ref <100)
LP-IR Score: <25 (ref <=45)
Small LDL Particle Number: 369 nmol/L (ref <=527)
Triglycerides by NMR: 141 mg/dL (ref <150)

## 2013-12-15 LAB — TESTOSTERONE,FREE AND TOTAL
Testosterone, Free: 21.3 pg/mL — ABNORMAL HIGH (ref 6.6–18.1)
Testosterone: 1304 ng/dL — ABNORMAL HIGH (ref 348–1197)

## 2013-12-15 LAB — HEPATIC FUNCTION PANEL
ALT: 11 IU/L (ref 0–44)
AST: 15 IU/L (ref 0–40)
Albumin: 4.2 g/dL (ref 3.5–4.7)
Alkaline Phosphatase: 100 IU/L (ref 39–117)
BILIRUBIN TOTAL: 0.6 mg/dL (ref 0.0–1.2)
Bilirubin, Direct: 0.21 mg/dL (ref 0.00–0.40)
Total Protein: 6.6 g/dL (ref 6.0–8.5)

## 2013-12-15 LAB — VITAMIN D 25 HYDROXY (VIT D DEFICIENCY, FRACTURES): Vit D, 25-Hydroxy: 27.9 ng/mL — ABNORMAL LOW (ref 30.0–100.0)

## 2013-12-16 ENCOUNTER — Ambulatory Visit (INDEPENDENT_AMBULATORY_CARE_PROVIDER_SITE_OTHER): Payer: Medicare PPO | Admitting: Family Medicine

## 2013-12-16 ENCOUNTER — Encounter: Payer: Self-pay | Admitting: Family Medicine

## 2013-12-16 ENCOUNTER — Telehealth: Payer: Self-pay | Admitting: Family Medicine

## 2013-12-16 VITALS — BP 153/82 | HR 87 | Temp 96.8°F | Ht 70.0 in | Wt 159.0 lb

## 2013-12-16 DIAGNOSIS — I10 Essential (primary) hypertension: Secondary | ICD-10-CM

## 2013-12-16 DIAGNOSIS — H6123 Impacted cerumen, bilateral: Secondary | ICD-10-CM

## 2013-12-16 DIAGNOSIS — I25810 Atherosclerosis of coronary artery bypass graft(s) without angina pectoris: Secondary | ICD-10-CM

## 2013-12-16 DIAGNOSIS — R5383 Other fatigue: Secondary | ICD-10-CM

## 2013-12-16 DIAGNOSIS — R5381 Other malaise: Secondary | ICD-10-CM

## 2013-12-16 DIAGNOSIS — R0602 Shortness of breath: Secondary | ICD-10-CM

## 2013-12-16 DIAGNOSIS — H6120 Impacted cerumen, unspecified ear: Secondary | ICD-10-CM

## 2013-12-16 NOTE — Telephone Encounter (Signed)
Message copied by Waverly Ferrari on Wed Dec 16, 2013 11:47 AM ------      Message from: Chipper Herb      Created: Tue Dec 15, 2013  6:28 PM       The blood sugar was good at 80. The creatinine, the most important kidney function test is within normal limits but at the upper end of the normal range. The GFR slightly decreased. Electrolytes including potassium are within normal limits.      All liver function tests are within normal limit      The LDL particle number is elevated at 1192. This is slightly more elevated than 4 months ago. The LDL C. is more elevated also at 105. Triglycerides are within normal limits. The good cholesterol, or the HDL particle number is low.------ continue to try to do better with exercise and diet      Testosterone and free direct testosterone levels are both elevated------ important--please confirm with patient when his last testosterone injection was done in relation to this lab work++++++ the testosterone levels should be drawn at least 2 weeks after the last testosterone injection      The vitamin D level is low at 27.9. Please make sure that the patient is taking vitamin D3, 1000 day ------

## 2013-12-16 NOTE — Telephone Encounter (Signed)
Message copied by Waverly Ferrari on Wed Dec 16, 2013 11:47 AM ------      Message from: Chipper Herb      Created: Sun Dec 13, 2013  1:42 PM       Bernie Covey 1 week and repeat the FOBT and get another CBC. If the hemoglobin remains low and the FOBT is positive he should be referred to the gastroenterologist. This is very important ++++++ ------

## 2013-12-16 NOTE — Progress Notes (Signed)
Subjective:    Patient ID: [name], male    DOB: 08/13/1927, 80 y.o.   MRN: [id number]  HPI Patient here today for 2 week recheck of SOB and ears impaction. Patient states that his SOB is about the same today. O2 level on room air today is 97%. A recent chest x-ray that was done, was reviewed, and basically showed a large hiatal hernia but no other significant findings. He did have an echocardiogram done in the late fall and his ejection fraction was good. He is scheduled for myocardial perfusion testing as noted in future orders. The patient has a knot above the medial left knee which he thinks is getting smaller. This is where the vein was harvested from to do his bypass surgery.        Patient Active Problem List   Diagnosis Date Noted  . S/P CABG x 4 09/03/2013  . Insomnia 07/27/2013  . Testosterone deficiency 07/27/2013  . Osteoarthritis of right knee 07/27/2013  . Hyperlipidemia 06/10/2013  . Heart murmur 06/10/2013  . Dyspnea 06/10/2013  . Inguinal hernia, left. 04/04/2012  . Paraesophageal hiatal hernia 03/24/2012  . Prostate cancer 01/15/2012  . Special screening for malignant neoplasms, colon 01/15/2012   Outpatient Encounter Prescriptions as of 12/16/2013  Medication Sig  . aspirin 325 MG EC tablet Take 1 tablet (325 mg total) by mouth daily.  Marland Kitchen oxybutynin (DITROPAN-XL) 10 MG 24 hr tablet Take 2 tablets by mouth daily.   . pramipexole (MIRAPEX) 1 MG tablet Take 1 tablet (1 mg total) by mouth daily. Takes as directed  . testosterone cypionate (DEPOTESTOTERONE CYPIONATE) 200 MG/ML injection Inject 1.5 mLs (300 mg total) into the muscle every 28 (twenty-eight) days.  . [DISCONTINUED] metoprolol tartrate (LOPRESSOR) 25 MG tablet Take 0.5 tablets (12.5 mg total) by mouth 2 (two) times daily.  . [DISCONTINUED] omeprazole (PRILOSEC) 40 MG capsule Take 40 mg by mouth daily.  . [DISCONTINUED] zolpidem (AMBIEN) 10 MG tablet Take 10 mg by mouth at bedtime as needed.  .  [DISCONTINUED] testosterone cypionate (DEPOTESTOTERONE CYPIONATE) injection 300 mg     Review of Systems  Constitutional: Negative.   HENT: Negative.  Ear discharge: ears stopped up.   Eyes: Negative.   Respiratory: Positive for shortness of breath.   Cardiovascular: Negative.   Gastrointestinal: Negative.   Endocrine: Negative.   Genitourinary: Negative.   Musculoskeletal: Positive for arthralgias (knee pain).  Skin: Negative.   Allergic/Immunologic: Negative.   Neurological: Negative.   Hematological: Negative.   Psychiatric/Behavioral: Negative.        Objective:   Physical Exam  Nursing note and vitals reviewed. Constitutional: He is oriented to person, place, and time. He appears well-developed and well-nourished. No distress.  HENT:  Head: Normocephalic and atraumatic.  Right Ear: External ear normal.  Left Ear: External ear normal.  Nose: Nose normal.  Mouth/Throat: Oropharynx is clear and moist. No oropharyngeal exudate.  Eyes: Conjunctivae and EOM are normal. Pupils are equal, round, and reactive to light. Right eye exhibits no discharge. Left eye exhibits no discharge. No scleral icterus.  Neck: Normal range of motion. Neck supple. No tracheal deviation present. No thyromegaly present.  No carotid bruit  Cardiovascular: Normal rate, regular rhythm, normal heart sounds and intact distal pulses.  Exam reveals no gallop and no friction rub.   No murmur heard. At 72 per minute  Pulmonary/Chest: Effort normal and breath sounds normal. No respiratory distress. He has no wheezes. He has no rales. He exhibits no tenderness.  Abdominal: Soft. Bowel sounds are normal. He exhibits no mass. There is no tenderness. There is no rebound and no guarding.  Musculoskeletal: Normal range of motion. He exhibits no edema and no tenderness.  Lymphadenopathy:    He has no cervical adenopathy.  Neurological: He is alert and oriented to person, place, and time.  Skin: Skin is warm and  dry. No rash noted. No erythema. No pallor.  There is a small knot proximal to the vein harvest medial to the left knee   Psychiatric: He has a normal mood and affect. His behavior is normal. Judgment and thought content normal.   BP 153/82  Pulse 87  Temp(Src) 96.8 F (36 C) (Oral)  Ht 5\' 10"  (1.778 m)  Wt 159 lb (72.122 kg)  BMI 22.81 kg/m2  SpO2 97%        Assessment & Plan:  1. SOB (shortness of breath)  2. Impacted cerumen of both ears -Left ear irrigation successful today  3. CAD (coronary artery disease) of artery bypass graft -Follow up with cardiologist as planned  4. Fatigue  5. Hypertension -Continue with current medication  Patient Instructions  Try to exercise regularly Drink plenty of water Monitor blood pressures and bring those for review and further to 5 week Also take a copy of the blood pressure readings on your visit to the cardiologist Watch sodium intake Shortness of breath continues we will have the pulmonologist to evaluate you further, but for now, we will wait until you see the cardiologist.   Arrie Senate MD

## 2013-12-16 NOTE — Patient Instructions (Addendum)
Try to exercise regularly Drink plenty of water Monitor blood pressures and bring those for review and further to 5 week Also take a copy of the blood pressure readings on your visit to the cardiologist Watch sodium intake Shortness of breath continues we will have the pulmonologist to evaluate you further, but for now, we will wait until you see the cardiologist.

## 2013-12-17 NOTE — Telephone Encounter (Signed)
Pt aware of lab results 

## 2013-12-28 ENCOUNTER — Telehealth: Payer: Self-pay | Admitting: Family Medicine

## 2013-12-28 ENCOUNTER — Other Ambulatory Visit (HOSPITAL_COMMUNITY): Payer: Medicare PPO

## 2013-12-28 ENCOUNTER — Other Ambulatory Visit: Payer: Medicare PPO

## 2013-12-28 DIAGNOSIS — Z1212 Encounter for screening for malignant neoplasm of rectum: Secondary | ICD-10-CM

## 2013-12-28 NOTE — Telephone Encounter (Signed)
Pt will come into clinic for rck

## 2013-12-31 LAB — FECAL OCCULT BLOOD, IMMUNOCHEMICAL: Fecal Occult Bld: NEGATIVE

## 2014-01-13 ENCOUNTER — Ambulatory Visit (INDEPENDENT_AMBULATORY_CARE_PROVIDER_SITE_OTHER): Payer: Medicare PPO | Admitting: Cardiology

## 2014-01-13 ENCOUNTER — Encounter: Payer: Self-pay | Admitting: Cardiology

## 2014-01-13 VITALS — BP 167/89 | HR 62 | Ht 70.0 in | Wt 158.0 lb

## 2014-01-13 DIAGNOSIS — Z951 Presence of aortocoronary bypass graft: Secondary | ICD-10-CM

## 2014-01-13 NOTE — Progress Notes (Signed)
HPI The patient presents as a new patient for me.  He saw Dr. Harl Bowie but he lives here.  He is s/p CABG earlier this year.  He reports that he has been doing well.  He has none of the dyspnea that he was having.  He is doing some activities but he is limited by his knees.  He is anxious to get these fixed.  The patient denies any new symptoms such as chest discomfort, neck or arm discomfort. There has been no new shortness of breath, PND or orthopnea. There have been no reported palpitations, presyncope or syncope.   Allergies  Allergen Reactions  . Lipitor [Atorvastatin] Other (See Comments)    "feel bad"    Current Outpatient Prescriptions  Medication Sig Dispense Refill  . aspirin 325 MG EC tablet Take 1 tablet (325 mg total) by mouth daily.  15 tablet  0  . Cholecalciferol (VITAMIN D3) 1000 UNITS CAPS Take by mouth.      . hydrochlorothiazide (HYDRODIURIL) 25 MG tablet Take 25 mg by mouth. Take 1/2 daily      . lisinopril (PRINIVIL,ZESTRIL) 20 MG tablet Take 20 mg by mouth daily.      . metoprolol succinate (TOPROL-XL) 25 MG 24 hr tablet Take 25 mg by mouth. Take 1/2 daily      . omeprazole (PRILOSEC) 40 MG capsule Take 40 mg by mouth daily.      Marland Kitchen oxybutynin (DITROPAN-XL) 10 MG 24 hr tablet Take 2 tablets by mouth daily.       . pramipexole (MIRAPEX) 1 MG tablet Take 1 tablet (1 mg total) by mouth daily. Takes as directed  90 tablet  3  . testosterone cypionate (DEPOTESTOTERONE CYPIONATE) 200 MG/ML injection Inject 1.5 mLs (300 mg total) into the muscle every 28 (twenty-eight) days.  10 mL  1   No current facility-administered medications for this visit.    Past Medical History  Diagnosis Date  . Prostate cancer   . GERD (gastroesophageal reflux disease)   . Hypertension   . Hyperlipemia   . Arthritis   . Inguinal hernia     left    Past Surgical History  Procedure Laterality Date  . Hernia repair    . Prostate surgery    . Colonoscopy    . Coronary artery bypass  graft N/A 09/03/2013    Procedure: CORONARY ARTERY BYPASS GRAFTING times four using left internal mammary and bilateral saphenous vein.;  Surgeon: Ivin Poot, MD;  Location: [facility];  Service: Open Heart Surgery;  Laterality: N/A;  . Intraoperative transesophageal echocardiogram N/A 09/03/2013    Procedure: INTRAOPERATIVE TRANSESOPHAGEAL ECHOCARDIOGRAM;  Surgeon: Ivin Poot, MD;  Location: [facility];  Service: Open Heart Surgery;  Laterality: N/A;    ROS:  As stated in the HPI and negative for all other systems.  PHYSICAL EXAM BP 167/89  Pulse 62  Ht 5\' 10"  (1.778 m)  Wt 158 lb (71.668 kg)  BMI 22.67 kg/m2 GENERAL:  Well appearing HEENT:  Pupils equal round and reactive, fundi not visualized, oral mucosa unremarkable NECK:  No jugular venous distention, waveform within normal limits, carotid upstroke brisk and symmetric, no bruits, no thyromegaly LYMPHATICS:  No cervical, inguinal adenopathy LUNGS:  Clear to auscultation bilaterally BACK:  No CVA tenderness CHEST: Well healed sternotomy scar. HEART:  PMI not displaced or sustained,S1 and S2 within normal limits, no S3, no S4, no clicks, no rubs, no murmurs ABD:  Flat, positive bowel sounds normal in frequency  in pitch, no bruits, no rebound, no guarding, no midline pulsatile mass, no hepatomegaly, no splenomegaly EXT:  2 plus pulses throughout, no edema, no cyanosis no clubbing SKIN:  No rashes no nodules NEURO:  Cranial nerves II through XII grossly intact, motor grossly intact throughout PSYCH:  Cognitively intact, oriented to person place and time   ASSESSMENT AND PLAN  CAD:   The patient has no new sypmtoms.  No further cardiovascular testing is indicated.  We will continue with aggressive risk reduction and meds as listed.  HTN:  His BP is elevated today.  However, it is well controlled at home.  I reviewed the BP diary.  No change in therapy is indicated.   KNEE PAIN:  I have suggested that it would be OK for him to have  surgery and he will follow up with his orthopedist.

## 2014-01-13 NOTE — Patient Instructions (Signed)
The current medical regimen is effective;  continue present plan and medications.  Follow up in 6 months with Dr Hochrein.  You will receive a letter in the mail 2 months before you are due.  Please call us when you receive this letter to schedule your follow up appointment.  

## 2014-01-14 ENCOUNTER — Telehealth: Payer: Self-pay | Admitting: Cardiology

## 2014-01-14 NOTE — Telephone Encounter (Signed)
Left message on voicemail office note that includes OK for surgery faxed to Dr Archie Endo office.

## 2014-01-14 NOTE — Telephone Encounter (Signed)
New message     Patient saw Dr Percival Spanish yesterday in [place].  He told the pt that he can have his knee fixed (knee replacement).  [facility] need clearance in writing.  Per pt, please fax clearance to them

## 2014-01-18 ENCOUNTER — Ambulatory Visit (INDEPENDENT_AMBULATORY_CARE_PROVIDER_SITE_OTHER): Payer: Medicare PPO | Admitting: *Deleted

## 2014-01-18 ENCOUNTER — Other Ambulatory Visit: Payer: Medicare PPO

## 2014-01-18 DIAGNOSIS — I10 Essential (primary) hypertension: Secondary | ICD-10-CM

## 2014-01-18 DIAGNOSIS — R5383 Other fatigue: Secondary | ICD-10-CM

## 2014-01-18 DIAGNOSIS — E785 Hyperlipidemia, unspecified: Secondary | ICD-10-CM

## 2014-01-18 DIAGNOSIS — R7989 Other specified abnormal findings of blood chemistry: Secondary | ICD-10-CM

## 2014-01-18 DIAGNOSIS — E291 Testicular hypofunction: Secondary | ICD-10-CM

## 2014-01-18 DIAGNOSIS — E559 Vitamin D deficiency, unspecified: Secondary | ICD-10-CM

## 2014-01-18 MED ORDER — TESTOSTERONE CYPIONATE 200 MG/ML IM SOLN
300.0000 mg | INTRAMUSCULAR | Status: AC
  Administered 2014-01-18 – 2014-09-21 (×8): 300 mg via INTRAMUSCULAR

## 2014-01-19 ENCOUNTER — Telehealth: Payer: Self-pay | Admitting: Family Medicine

## 2014-01-19 DIAGNOSIS — M25569 Pain in unspecified knee: Secondary | ICD-10-CM

## 2014-01-19 LAB — TESTOSTERONE,FREE AND TOTAL
TESTOSTERONE FREE: 5.3 pg/mL — AB (ref 6.6–18.1)
Testosterone: 438 ng/dL (ref 348–1197)

## 2014-01-20 ENCOUNTER — Telehealth: Payer: Self-pay | Admitting: Cardiology

## 2014-01-20 NOTE — Telephone Encounter (Signed)
Spoke with Dr. Archie Endo office.  He recognizes that Dr. Percival Spanish cleared patient even though he is only 5 month s/p cardiac surgery. Dr. Noemi Chapel is not comfortable performing the surgery until patient is 12 months post op even with the cardiac clearance.

## 2014-01-20 NOTE — Telephone Encounter (Signed)
Pt called to see if Dr. Percival Spanish know a good surgeon to do his knee surgery. His now surgeon Lilliahna Schubring/[facility] would like to wait a year from heart surgery. Pt states he needs his knee fixed soon, because his knee hurts badly.

## 2014-01-20 NOTE — Telephone Encounter (Signed)
Will contact Dr. Archie Endo office for more information.

## 2014-01-20 NOTE — Telephone Encounter (Signed)
Please refer patient to Dr. Maureen Ralphs

## 2014-01-20 NOTE — Telephone Encounter (Signed)
New message     Saw Dr Percival Spanish in [place] last week.  Dr Percival Spanish said he could get his knee fixed but [facility] said he needed to wait a year from heart surgery.  Pt said he cannot wait that long because his knee hurt---it needs to be replaced.  Do you know anyone who will fix his knee or have any suggestions?

## 2014-01-21 NOTE — Telephone Encounter (Signed)
Will forward to Dr Hochrein for recommendations. 

## 2014-01-21 NOTE — Telephone Encounter (Signed)
I will do a referral but a second opinion isn't easy to obtain.

## 2014-01-22 NOTE — Telephone Encounter (Signed)
Pt aware of names of MDs that do surgeries on knees in the [place] area

## 2014-01-25 ENCOUNTER — Other Ambulatory Visit (HOSPITAL_COMMUNITY): Payer: Medicare PPO

## 2014-02-02 ENCOUNTER — Other Ambulatory Visit: Payer: Self-pay | Admitting: Orthopaedic Surgery

## 2014-02-12 ENCOUNTER — Ambulatory Visit (INDEPENDENT_AMBULATORY_CARE_PROVIDER_SITE_OTHER): Payer: Medicare PPO | Admitting: *Deleted

## 2014-02-12 DIAGNOSIS — E291 Testicular hypofunction: Secondary | ICD-10-CM

## 2014-02-12 NOTE — Progress Notes (Signed)
Testosterone given and tolerated well

## 2014-02-12 NOTE — Patient Instructions (Signed)
Testosterone injection What is this medicine? TESTOSTERONE (tes TOS ter one) is the main male hormone. It supports normal male development such as muscle growth, facial hair, and deep voice. It is used in males to treat low testosterone levels. This medicine may be used for other purposes; ask your health care provider or pharmacist if you have questions. COMMON BRAND NAME(S): Andro-L.A., Aveed, Delatestryl, Depo-Testosterone, Virilon What should I tell my health care provider before I take this medicine? They need to know if you have any of these conditions: -breast cancer -diabetes -heart disease -kidney disease -liver disease -lung disease -prostate cancer, enlargement -an unusual or allergic reaction to testosterone, other medicines, foods, dyes, or preservatives -pregnant or trying to get pregnant -breast-feeding How should I use this medicine? This medicine is for injection into a muscle. It is usually given by a health care professional in a hospital or clinic setting. Contact your pediatrician regarding the use of this medicine in children. While this medicine may be prescribed for children as young as 32 years of age for selected conditions, precautions do apply. Overdosage: If you think you have taken too much of this medicine contact a poison control center or emergency room at once. NOTE: This medicine is only for you. Do not share this medicine with others. What if I miss a dose? Try not to miss a dose. Your doctor or health care professional will tell you when your next injection is due. Notify the office if you are unable to keep an appointment. What may interact with this medicine? -medicines for diabetes -medicines that treat or prevent blood clots like warfarin -oxyphenbutazone -propranolol -steroid medicines like prednisone or cortisone This list may not describe all possible interactions. Give your health care provider a list of all the medicines, herbs,  non-prescription drugs, or dietary supplements you use. Also tell them if you smoke, drink alcohol, or use illegal drugs. Some items may interact with your medicine. What should I watch for while using this medicine? Visit your doctor or health care professional for regular checks on your progress. They will need to check the level of testosterone in your blood. This medicine may affect blood sugar levels. If you have diabetes, check with your doctor or health care professional before you change your diet or the dose of your diabetic medicine. This drug is banned from use in athletes by most athletic organizations. What side effects may I notice from receiving this medicine? Side effects that you should report to your doctor or health care professional as soon as possible: -allergic reactions like skin rash, itching or hives, swelling of the face, lips, or tongue -breast enlargement -breathing problems -changes in mood, especially anger, depression, or rage -dark urine -general ill feeling or flu-like symptoms -light-colored stools -loss of appetite, nausea -nausea, vomiting -right upper belly pain -stomach pain -swelling of ankles -too frequent or persistent erections -trouble passing urine or change in the amount of urine -unusually weak or tired -yellowing of the eyes or skin Additional side effects that can occur in women include: -deep or hoarse voice -facial hair growth -irregular menstrual periods Side effects that usually do not require medical attention (report to your doctor or health care professional if they continue or are bothersome): -acne -change in sex drive or performance -hair loss -headache This list may not describe all possible side effects. Call your doctor for medical advice about side effects. You may report side effects to FDA at 1-800-FDA-1088. Where should I keep my medicine? Keep  out of the reach of children. This medicine can be abused. Keep your  medicine in a safe place to protect it from theft. Do not share this medicine with anyone. Selling or giving away this medicine is dangerous and against the law. Store at room temperature between 20 and 25 degrees C (68 and 77 degrees F). Do not freeze. Protect from light. Follow the directions for the product you are prescribed. Throw away any unused medicine after the expiration date. NOTE: This sheet is a summary. It may not cover all possible information. If you have questions about this medicine, talk to your doctor, pharmacist, or health care provider.  2015, Elsevier/Gold Standard. (2007-10-17 16:13:46)

## 2014-02-25 ENCOUNTER — Encounter (HOSPITAL_COMMUNITY): Payer: Self-pay | Admitting: Pharmacy Technician

## 2014-02-27 NOTE — Pre-Procedure Instructions (Signed)
Chais Fehringer Elsberry  02/27/2014   Your procedure is scheduled on:  July 23  Report to [facility] Admitting at 05:30 AM.  Call this number if you have problems the morning of surgery: [phone]   Remember:   Do not eat food or drink liquids after midnight.   Take these medicines the morning of surgery with A SIP OF WATER: Metoprolol, Omeprazole, Oxybutynin,    STOP Aspirin, Vitamin D today   STOP/ Do not take Aspirin, Aleve, Naproxen, Advil, Ibuprofen, Motrin, Vitamins, Herbs, or Supplements starting today   Do not wear jewelry, make-up or nail polish.  Do not wear lotions, powders, or perfumes. You may wear deodorant.  Do not shave 48 hours prior to surgery. Men may shave face and neck.  Do not bring valuables to the hospital.  [facility] is not responsible for any belongings or valuables.               Contacts, dentures or bridgework may not be worn into surgery.  Leave suitcase in the car. After surgery it may be brought to your room.  For patients admitted to the hospital, discharge time is determined by your treatment team.               Special Instructions: See [facility] Preparing For Surgery   Please read over the following fact sheets that you were given: Pain Booklet, Coughing and Deep Breathing, Blood Transfusion Information, Total Joint Packet and Surgical Site Infection Prevention

## 2014-03-01 ENCOUNTER — Encounter (HOSPITAL_COMMUNITY)
Admission: RE | Admit: 2014-03-01 | Discharge: 2014-03-01 | Disposition: A | Discharge status: Home / Self Care | Payer: Medicare PPO | Source: Ambulatory Visit | Attending: Orthopaedic Surgery | Admitting: Orthopaedic Surgery

## 2014-03-01 ENCOUNTER — Encounter (HOSPITAL_COMMUNITY): Payer: Self-pay

## 2014-03-01 DIAGNOSIS — Z01812 Encounter for preprocedural laboratory examination: Secondary | ICD-10-CM | POA: Insufficient documentation

## 2014-03-01 DIAGNOSIS — Z01818 Encounter for other preprocedural examination: Secondary | ICD-10-CM | POA: Insufficient documentation

## 2014-03-01 HISTORY — DX: Atherosclerotic heart disease of native coronary artery without angina pectoris: I25.10

## 2014-03-01 HISTORY — DX: Pneumonia, unspecified organism: J18.9

## 2014-03-01 HISTORY — DX: Personal history of other diseases of the digestive system: Z87.19

## 2014-03-01 HISTORY — DX: Cardiac arrhythmia, unspecified: I49.9

## 2014-03-01 LAB — CBC WITH DIFFERENTIAL/PLATELET
BASOS ABS: 0 10*3/uL (ref 0.0–0.1)
Basophils Relative: 0 % (ref 0–1)
EOS ABS: 0.2 10*3/uL (ref 0.0–0.7)
EOS PCT: 2 % (ref 0–5)
HCT: 43.6 % (ref 39.0–52.0)
Hemoglobin: 14.1 g/dL (ref 13.0–17.0)
LYMPHS ABS: 1.1 10*3/uL (ref 0.7–4.0)
LYMPHS PCT: 12 % (ref 12–46)
MCH: 29.7 pg (ref 26.0–34.0)
MCHC: 32.3 g/dL (ref 30.0–36.0)
MCV: 91.8 fL (ref 78.0–100.0)
Monocytes Absolute: 0.6 10*3/uL (ref 0.1–1.0)
Monocytes Relative: 7 % (ref 3–12)
NEUTROS PCT: 79 % — AB (ref 43–77)
Neutro Abs: 6.8 10*3/uL (ref 1.7–7.7)
PLATELETS: 277 10*3/uL (ref 150–400)
RBC: 4.75 MIL/uL (ref 4.22–5.81)
RDW: 15.5 % (ref 11.5–15.5)
WBC: 8.7 10*3/uL (ref 4.0–10.5)

## 2014-03-01 LAB — BASIC METABOLIC PANEL
Anion gap: 13 (ref 5–15)
BUN: 16 mg/dL (ref 6–23)
CO2: 27 mEq/L (ref 19–32)
Calcium: 9.8 mg/dL (ref 8.4–10.5)
Chloride: 97 mEq/L (ref 96–112)
Creatinine, Ser: 1.29 mg/dL (ref 0.50–1.35)
GFR, EST AFRICAN AMERICAN: 57 mL/min — AB (ref >90)
GFR, EST NON AFRICAN AMERICAN: 49 mL/min — AB (ref >90)
Glucose, Bld: 116 mg/dL — ABNORMAL HIGH (ref 70–99)
POTASSIUM: 3.8 meq/L (ref 3.7–5.3)
SODIUM: 137 meq/L (ref 137–147)

## 2014-03-01 LAB — TYPE AND SCREEN
ABO/RH(D): AB NEG
Antibody Screen: NEGATIVE

## 2014-03-01 LAB — URINALYSIS, ROUTINE W REFLEX MICROSCOPIC
BILIRUBIN URINE: NEGATIVE
Glucose, UA: NEGATIVE mg/dL
HGB URINE DIPSTICK: NEGATIVE
KETONES UR: NEGATIVE mg/dL
Leukocytes, UA: NEGATIVE
NITRITE: NEGATIVE
PH: 6.5 (ref 5.0–8.0)
Protein, ur: NEGATIVE mg/dL
SPECIFIC GRAVITY, URINE: 1.013 (ref 1.005–1.030)
Urobilinogen, UA: 0.2 mg/dL (ref 0.0–1.0)

## 2014-03-01 LAB — SURGICAL PCR SCREEN
MRSA, PCR: NEGATIVE
STAPHYLOCOCCUS AUREUS: NEGATIVE

## 2014-03-01 LAB — APTT: APTT: 35 s (ref 24–37)

## 2014-03-01 LAB — PROTIME-INR
INR: 1.01 (ref 0.00–1.49)
PROTHROMBIN TIME: 13.3 s (ref 11.6–15.2)

## 2014-03-01 NOTE — Progress Notes (Signed)
PCP is Dr. [name] Cardiologist is Dr Percival Spanish Echo noted in epic 06-17-13 Stress test noted in epic 01-28-14 EKG and CXR also noted in epic from March and April of this year

## 2014-03-02 NOTE — Progress Notes (Signed)
Anesthesia Chart Review:  Patient is a 80 year old male scheduled for right TKA on 03/11/14 by Dr. Rhona Raider.    History includes former smoker, CAD s/p CABG (LIMA to LAD, SVG to DIAG, Sequential SVG to OM1 and OM2) 09/04/13, dysrhythmia (not specified), mild AS by 05/2013 echo, SOB, HTN, HLD, GERD, hiatal hernia, prostate cancer s/p prostatectomy > 15 years ago, left IHR repair ~ 2013, cataract extraction. Cardiologist is Dr. Percival Spanish [facility], [facility] [place] office) who felt it would be okay for him to proceed with knee surgery without further testing. PCP is Dr. [name].  EKG on 10/22/13 showed SB, LAFB, non-specific T wave abnormality.  His last cardiac cath was pre-CABG on 08/03/13 and showed severe 2V CAD involving the mid-LAD and the proximal and mid CX. Mild to moderate RCA stenosis.  EF 71% by Myoview scan.  Echo on 06/17/13 showed:  - Study data: Technically adequate study. - Left ventricle: The cavity size was normal. Emerick thickness was normal. Systolic function was normal. The estimated ejection fraction was in the range of 55% to 60%. Indeterminate diastolic function, there is evidence of increased left atrial pressure. - Aortic valve: Mildly to moderately calcified annulus.Uncertain number of leaflets,mildly thickened, moderately calcified leaflets. There was mild stenosis. Valve area: 1.77cm^2(VTI). Valve area: 1.92cm^2 (Vmax).  - Mitral valve: Calcified annulus. Mildly thickened leaflets. Mild regurgitation. - Left atrium: The atrium was severely dilated. - Right atrium: The atrium was mildly dilated.  Carotid duplex on 09/01/13 showed: 1-39% bilateral ICA stenosis, antegrade vertebral flow.  CXR on 12/01/13 showed: No active cardiopulmonary disease.  S/P CABG. Large hiatal hernia again noted.  Preoperative labs noted.   Patient is s/p CABG within the past year, but was cleared by his cardiologist for surgery.  He will be further evaluated by his assigned anesthesiologist on the day of  surgery, but if no acute changes then I anticipate that he can proceed as planned.  George [facility] Short Stay Center/Anesthesiology Phone [phone] 03/02/2014 5:32 PM

## 2014-03-03 NOTE — H&P (Signed)
TOTAL KNEE ADMISSION H&P  Patient is being admitted for right total knee arthroplasty.  Subjective:  Chief Complaint:right knee pain.  HPI: [name], 80 y.o. male, has a history of pain and functional disability in the right knee due to arthritis and has failed non-surgical conservative treatments for greater than 12 weeks to includeNSAID's and/or analgesics, corticosteriod injections, flexibility and strengthening excercises, weight reduction as appropriate and activity modification.  Onset of symptoms was gradual, starting 5 years ago with gradually worsening course since that time. The patient noted no past surgery on the right knee(s).  Patient currently rates pain in the right knee(s) at 10 out of 10 with activity. Patient has night pain, worsening of pain with activity and weight bearing, pain that interferes with activities of daily living, crepitus and joint swelling.  Patient has evidence of subchondral cysts, subchondral sclerosis, periarticular osteophytes and joint space narrowing by imaging studies. There is no active infection.  Patient Active Problem List   Diagnosis Date Noted  . S/P CABG x 4 09/03/2013  . Insomnia 07/27/2013  . Testosterone deficiency 07/27/2013  . Osteoarthritis of right knee 07/27/2013  . Hyperlipidemia 06/10/2013  . Heart murmur 06/10/2013  . Dyspnea 06/10/2013  . Inguinal hernia, left. 04/04/2012  . Paraesophageal hiatal hernia 03/24/2012  . Prostate cancer 01/15/2012  . Special screening for malignant neoplasms, colon 01/15/2012   Past Medical History  Diagnosis Date  . Prostate cancer   . GERD (gastroesophageal reflux disease)   . Hypertension   . Hyperlipemia   . Arthritis   . Inguinal hernia     left  . Coronary artery disease   . Dysrhythmia   . Shortness of breath   . Pneumonia   . H/O hiatal hernia     Past Surgical History  Procedure Laterality Date  . Hernia repair    . Prostate surgery    . Colonoscopy    . Coronary artery  bypass graft N/A 09/03/2013    Procedure: CORONARY ARTERY BYPASS GRAFTING times four using left internal mammary and bilateral saphenous vein.;  Surgeon: Ivin Poot, MD;  Location: [place];  Service: Open Heart Surgery;  Laterality: N/A;  . Intraoperative transesophageal echocardiogram N/A 09/03/2013    Procedure: INTRAOPERATIVE TRANSESOPHAGEAL ECHOCARDIOGRAM;  Surgeon: Ivin Poot, MD;  Location: [place];  Service: Open Heart Surgery;  Laterality: N/A;  . Coronary angioplasty    . Cataract extraction Bilateral     No prescriptions prior to admission   Allergies  Allergen Reactions  . Lipitor [Atorvastatin] Other (See Comments)    "feel bad"    History  Substance Use Topics  . Smoking status: Former Smoker -- 1.00 packs/day    Types: Cigarettes    Quit date: 08/20/1978  . Smokeless tobacco: Never Used  . Alcohol Use: No    Family History  Problem Relation Age of Onset  . Colon cancer Neg Hx   . Cancer Mother     bone marrow     Review of Systems  Musculoskeletal: Positive for joint pain.       Right knee    Objective:  Physical Exam  Constitutional: He is oriented to person, place, and time. He appears well-developed and well-nourished.  HENT:  Head: Normocephalic and atraumatic.  Eyes: Pupils are equal, round, and reactive to light.  Neck: Normal range of motion.  Cardiovascular: Normal rate and regular rhythm.   Respiratory: Effort normal.  GI: Soft.  Musculoskeletal:  Right knee motion is 10-95.  Opposite knee goes about 5-95.  He has moderate varus deformities on both sides.  He has medial joint line pain and crepitation.   Hip motion is full and pain free and SLR is negative on both sides.  There is no palpable LAD behind either knee.  Sensation and motor function are intact on both sides and there are palpable pulses on both sides  Neurological: He is alert and oriented to person, place, and time.  Skin: Skin is warm and dry.  Psychiatric: He has a normal  mood and affect. His behavior is normal. Judgment and thought content normal.    Vital signs in last 24 hours:    Labs:   Estimated body mass index is 22.67 kg/(m^2) as calculated from the following:   Height as of 01/13/14: 5\' 10"  (1.778 m).   Weight as of 01/13/14: 71.668 kg (158 lb).   Imaging Review Plain radiographs demonstrate severe degenerative joint disease of the right knee(s). The overall alignment isneutral. The bone quality appears to be good for age and reported activity level.  Assessment/Plan:  End stage arthritis, right knee   The patient history, physical examination, clinical judgment of the provider and imaging studies are consistent with end stage degenerative joint disease of the right knee(s) and total knee arthroplasty is deemed medically necessary. The treatment options including medical management, injection therapy arthroscopy and arthroplasty were discussed at length. The risks and benefits of total knee arthroplasty were presented and reviewed. The risks due to aseptic loosening, infection, stiffness, patella tracking problems, thromboembolic complications and other imponderables were discussed. The patient acknowledged the explanation, agreed to proceed with the plan and consent was signed. Patient is being admitted for inpatient treatment for surgery, pain control, PT, OT, prophylactic antibiotics, VTE prophylaxis, progressive ambulation and ADL's and discharge planning. The patient is planning to be discharged home with home health services

## 2014-03-10 MED ORDER — CEFAZOLIN SODIUM-DEXTROSE 2-3 GM-% IV SOLR
2.0000 g | INTRAVENOUS | Status: AC
  Administered 2014-03-11 (×2): 2 g via INTRAVENOUS
  Filled 2014-03-10: qty 50

## 2014-03-11 ENCOUNTER — Inpatient Hospital Stay (HOSPITAL_COMMUNITY)
Admission: RE | Admit: 2014-03-11 | Discharge: 2014-03-15 | DRG: 470 | Disposition: A | Discharge status: Skilled Nursing Facility | Payer: Medicare PPO | Source: Ambulatory Visit | Attending: Orthopaedic Surgery | Admitting: Orthopaedic Surgery

## 2014-03-11 ENCOUNTER — Encounter (HOSPITAL_COMMUNITY): Payer: Self-pay | Admitting: *Deleted

## 2014-03-11 ENCOUNTER — Encounter (HOSPITAL_COMMUNITY)
Admission: RE | Disposition: A | Discharge status: Skilled Nursing Facility | Payer: Self-pay | Source: Ambulatory Visit | Attending: Orthopaedic Surgery

## 2014-03-11 ENCOUNTER — Inpatient Hospital Stay (HOSPITAL_COMMUNITY): Payer: Medicare PPO | Admitting: Certified Registered Nurse Anesthetist

## 2014-03-11 ENCOUNTER — Encounter (HOSPITAL_COMMUNITY): Payer: Medicare PPO | Admitting: Vascular Surgery

## 2014-03-11 DIAGNOSIS — E785 Hyperlipidemia, unspecified: Secondary | ICD-10-CM | POA: Diagnosis present

## 2014-03-11 DIAGNOSIS — Z9861 Coronary angioplasty status: Secondary | ICD-10-CM | POA: Diagnosis not present

## 2014-03-11 DIAGNOSIS — I2510 Atherosclerotic heart disease of native coronary artery without angina pectoris: Secondary | ICD-10-CM | POA: Diagnosis present

## 2014-03-11 DIAGNOSIS — M1710 Unilateral primary osteoarthritis, unspecified knee: Secondary | ICD-10-CM | POA: Diagnosis present

## 2014-03-11 DIAGNOSIS — Z951 Presence of aortocoronary bypass graft: Secondary | ICD-10-CM | POA: Diagnosis not present

## 2014-03-11 DIAGNOSIS — M1711 Unilateral primary osteoarthritis, right knee: Secondary | ICD-10-CM | POA: Diagnosis present

## 2014-03-11 DIAGNOSIS — K219 Gastro-esophageal reflux disease without esophagitis: Secondary | ICD-10-CM | POA: Diagnosis present

## 2014-03-11 DIAGNOSIS — Z87891 Personal history of nicotine dependence: Secondary | ICD-10-CM | POA: Diagnosis not present

## 2014-03-11 DIAGNOSIS — I10 Essential (primary) hypertension: Secondary | ICD-10-CM | POA: Diagnosis present

## 2014-03-11 DIAGNOSIS — M25569 Pain in unspecified knee: Secondary | ICD-10-CM | POA: Diagnosis present

## 2014-03-11 DIAGNOSIS — Z8546 Personal history of malignant neoplasm of prostate: Secondary | ICD-10-CM | POA: Diagnosis not present

## 2014-03-11 HISTORY — PX: TOTAL KNEE ARTHROPLASTY: SHX125

## 2014-03-11 SURGERY — ARTHROPLASTY, KNEE, TOTAL
Anesthesia: Regional | Site: Knee | Laterality: Right

## 2014-03-11 MED ORDER — ASPIRIN EC 325 MG PO TBEC
325.0000 mg | DELAYED_RELEASE_TABLET | Freq: Two times a day (BID) | ORAL | Status: DC
  Administered 2014-03-12 – 2014-03-15 (×7): 325 mg via ORAL
  Filled 2014-03-11 (×9): qty 1

## 2014-03-11 MED ORDER — HYDROCHLOROTHIAZIDE 25 MG PO TABS
12.5000 mg | ORAL_TABLET | Freq: Every day | ORAL | Status: DC
  Administered 2014-03-11 – 2014-03-14 (×4): 12.5 mg via ORAL
  Filled 2014-03-11 (×5): qty 0.5

## 2014-03-11 MED ORDER — ACETAMINOPHEN 650 MG RE SUPP: 650.0000 mg | Freq: Four times a day (QID) | RECTAL | Status: DC | PRN

## 2014-03-11 MED ORDER — BUPIVACAINE LIPOSOME 1.3 % IJ SUSP
20.0000 mL | INTRAMUSCULAR | Status: AC
  Administered 2014-03-11: 20 mL
  Filled 2014-03-11: qty 20

## 2014-03-11 MED ORDER — PROPOFOL 10 MG/ML IV BOLUS
INTRAVENOUS | Status: AC
  Filled 2014-03-11: qty 20

## 2014-03-11 MED ORDER — PHENYLEPHRINE HCL 10 MG/ML IJ SOLN
INTRAMUSCULAR | Status: DC | PRN
  Administered 2014-03-11 (×7): 100 ug via INTRAVENOUS

## 2014-03-11 MED ORDER — METOPROLOL SUCCINATE 12.5 MG HALF TABLET
12.5000 mg | ORAL_TABLET | Freq: Every day | ORAL | Status: DC
  Administered 2014-03-12 – 2014-03-15 (×4): 12.5 mg via ORAL
  Filled 2014-03-11 (×4): qty 1

## 2014-03-11 MED ORDER — LACTATED RINGERS IV SOLN
INTRAVENOUS | Status: DC | PRN
  Administered 2014-03-11 (×2): via INTRAVENOUS

## 2014-03-11 MED ORDER — ONDANSETRON HCL 4 MG/2ML IJ SOLN
INTRAMUSCULAR | Status: DC | PRN
  Administered 2014-03-11: 4 mg via INTRAVENOUS

## 2014-03-11 MED ORDER — PRAMIPEXOLE DIHYDROCHLORIDE 1 MG PO TABS
1.0000 mg | ORAL_TABLET | Freq: Every day | ORAL | Status: DC
  Administered 2014-03-11 – 2014-03-14 (×4): 1 mg via ORAL
  Filled 2014-03-11 (×5): qty 1

## 2014-03-11 MED ORDER — VITAMIN D 1000 UNITS PO TABS
1000.0000 [IU] | ORAL_TABLET | Freq: Every day | ORAL | Status: DC
  Administered 2014-03-12 – 2014-03-14 (×3): 1000 [IU] via ORAL
  Filled 2014-03-11 (×4): qty 1

## 2014-03-11 MED ORDER — DIPHENHYDRAMINE HCL 12.5 MG/5ML PO ELIX: 12.5000 mg | ORAL_SOLUTION | ORAL | Status: DC | PRN

## 2014-03-11 MED ORDER — PHENOL 1.4 % MT LIQD: 1.0000 | OROMUCOSAL | Status: DC | PRN

## 2014-03-11 MED ORDER — SODIUM CHLORIDE 0.9 % IR SOLN
Status: DC | PRN
  Administered 2014-03-11: 3000 mL

## 2014-03-11 MED ORDER — LACTATED RINGERS IV SOLN: INTRAVENOUS | Status: DC

## 2014-03-11 MED ORDER — ONDANSETRON HCL 4 MG/2ML IJ SOLN: 4.0000 mg | Freq: Four times a day (QID) | INTRAMUSCULAR | Status: DC | PRN

## 2014-03-11 MED ORDER — LACTATED RINGERS IV SOLN
INTRAVENOUS | Status: DC
  Administered 2014-03-12: 04:00:00 via INTRAVENOUS

## 2014-03-11 MED ORDER — OXYBUTYNIN CHLORIDE ER 10 MG PO TB24
10.0000 mg | ORAL_TABLET | Freq: Every day | ORAL | Status: DC
  Administered 2014-03-12 – 2014-03-15 (×4): 10 mg via ORAL
  Filled 2014-03-11 (×4): qty 1

## 2014-03-11 MED ORDER — LISINOPRIL 20 MG PO TABS
20.0000 mg | ORAL_TABLET | Freq: Every day | ORAL | Status: DC
  Administered 2014-03-11 – 2014-03-15 (×5): 20 mg via ORAL
  Filled 2014-03-11 (×5): qty 1

## 2014-03-11 MED ORDER — EPHEDRINE SULFATE 50 MG/ML IJ SOLN
INTRAMUSCULAR | Status: DC | PRN
  Administered 2014-03-11 (×2): 10 mg via INTRAVENOUS
  Administered 2014-03-11: 5 mg via INTRAVENOUS

## 2014-03-11 MED ORDER — BISACODYL 5 MG PO TBEC
5.0000 mg | DELAYED_RELEASE_TABLET | Freq: Every day | ORAL | Status: DC | PRN
  Administered 2014-03-13: 5 mg via ORAL
  Filled 2014-03-11 (×2): qty 1

## 2014-03-11 MED ORDER — MENTHOL 3 MG MT LOZG
1.0000 | LOZENGE | OROMUCOSAL | Status: DC | PRN
  Administered 2014-03-13: 3 mg via ORAL
  Filled 2014-03-11 (×4): qty 9

## 2014-03-11 MED ORDER — PROPOFOL 10 MG/ML IV BOLUS
INTRAVENOUS | Status: DC | PRN
  Administered 2014-03-11: 20 mg via INTRAVENOUS
  Administered 2014-03-11: 40 mg via INTRAVENOUS
  Administered 2014-03-11 (×3): 20 mg via INTRAVENOUS
  Administered 2014-03-11: 40 mg via INTRAVENOUS
  Administered 2014-03-11: 20 mg via INTRAVENOUS
  Administered 2014-03-11: 40 mg via INTRAVENOUS

## 2014-03-11 MED ORDER — ONDANSETRON HCL 4 MG/2ML IJ SOLN: 4.0000 mg | Freq: Once | INTRAMUSCULAR | Status: DC | PRN

## 2014-03-11 MED ORDER — DOCUSATE SODIUM 100 MG PO CAPS
100.0000 mg | ORAL_CAPSULE | Freq: Two times a day (BID) | ORAL | Status: DC
  Administered 2014-03-11 – 2014-03-15 (×8): 100 mg via ORAL
  Filled 2014-03-11 (×9): qty 1

## 2014-03-11 MED ORDER — METOCLOPRAMIDE HCL 5 MG/ML IJ SOLN: 5.0000 mg | Freq: Three times a day (TID) | INTRAMUSCULAR | Status: DC | PRN

## 2014-03-11 MED ORDER — HYDROMORPHONE HCL PF 1 MG/ML IJ SOLN
0.5000 mg | INTRAMUSCULAR | Status: DC | PRN
  Administered 2014-03-11: 0.5 mg via INTRAVENOUS
  Filled 2014-03-11: qty 1

## 2014-03-11 MED ORDER — ACETAMINOPHEN 325 MG PO TABS
650.0000 mg | ORAL_TABLET | Freq: Four times a day (QID) | ORAL | Status: DC | PRN
  Filled 2014-03-11: qty 2

## 2014-03-11 MED ORDER — ALUM & MAG HYDROXIDE-SIMETH 200-200-20 MG/5ML PO SUSP: 30.0000 mL | ORAL | Status: DC | PRN

## 2014-03-11 MED ORDER — ONDANSETRON HCL 4 MG PO TABS: 4.0000 mg | ORAL_TABLET | Freq: Four times a day (QID) | ORAL | Status: DC | PRN

## 2014-03-11 MED ORDER — HYDROCODONE-ACETAMINOPHEN 5-325 MG PO TABS
1.0000 | ORAL_TABLET | ORAL | Status: DC | PRN
  Administered 2014-03-11 – 2014-03-15 (×7): 2 via ORAL
  Administered 2014-03-15: 1 via ORAL
  Filled 2014-03-11 (×9): qty 2

## 2014-03-11 MED ORDER — CHLORHEXIDINE GLUCONATE 4 % EX LIQD
60.0000 mL | Freq: Once | CUTANEOUS | Status: DC
  Filled 2014-03-11: qty 60

## 2014-03-11 MED ORDER — METHOCARBAMOL 500 MG PO TABS
ORAL_TABLET | ORAL | Status: AC
  Filled 2014-03-11: qty 1

## 2014-03-11 MED ORDER — FENTANYL CITRATE 0.05 MG/ML IJ SOLN
INTRAMUSCULAR | Status: AC
  Filled 2014-03-11: qty 5

## 2014-03-11 MED ORDER — 0.9 % SODIUM CHLORIDE (POUR BTL) OPTIME
TOPICAL | Status: DC | PRN
  Administered 2014-03-11: 1000 mL

## 2014-03-11 MED ORDER — HYDROMORPHONE HCL PF 1 MG/ML IJ SOLN
0.2500 mg | INTRAMUSCULAR | Status: DC | PRN
  Administered 2014-03-11 (×2): 0.5 mg via INTRAVENOUS

## 2014-03-11 MED ORDER — METOCLOPRAMIDE HCL 5 MG PO TABS
5.0000 mg | ORAL_TABLET | Freq: Three times a day (TID) | ORAL | Status: DC | PRN
  Filled 2014-03-11: qty 2

## 2014-03-11 MED ORDER — METHOCARBAMOL 500 MG PO TABS
500.0000 mg | ORAL_TABLET | Freq: Four times a day (QID) | ORAL | Status: DC | PRN
  Administered 2014-03-11 – 2014-03-13 (×3): 500 mg via ORAL
  Filled 2014-03-11 (×4): qty 1

## 2014-03-11 MED ORDER — HYDROMORPHONE HCL PF 1 MG/ML IJ SOLN
INTRAMUSCULAR | Status: AC
  Filled 2014-03-11: qty 1

## 2014-03-11 MED ORDER — CEFAZOLIN SODIUM-DEXTROSE 2-3 GM-% IV SOLR
2.0000 g | Freq: Four times a day (QID) | INTRAVENOUS | Status: AC
  Administered 2014-03-11 (×2): 2 g via INTRAVENOUS
  Filled 2014-03-11 (×2): qty 50

## 2014-03-11 MED ORDER — PANTOPRAZOLE SODIUM 40 MG PO TBEC
80.0000 mg | DELAYED_RELEASE_TABLET | Freq: Every day | ORAL | Status: DC
  Administered 2014-03-12 – 2014-03-15 (×4): 80 mg via ORAL
  Filled 2014-03-11 (×4): qty 2

## 2014-03-11 MED ORDER — FENTANYL CITRATE 0.05 MG/ML IJ SOLN
INTRAMUSCULAR | Status: DC | PRN
  Administered 2014-03-11 (×2): 50 ug via INTRAVENOUS

## 2014-03-11 MED ORDER — METHOCARBAMOL 1000 MG/10ML IJ SOLN
500.0000 mg | Freq: Four times a day (QID) | INTRAMUSCULAR | Status: DC | PRN
  Filled 2014-03-11: qty 5

## 2014-03-11 SURGICAL SUPPLY — 66 items
BANDAGE ELASTIC 4 VELCRO ST LF (GAUZE/BANDAGES/DRESSINGS) IMPLANT
BANDAGE ESMARK 6X9 LF (GAUZE/BANDAGES/DRESSINGS) IMPLANT
BANDAGE GAUZE ELAST BULKY 4 IN (GAUZE/BANDAGES/DRESSINGS) IMPLANT
BENZOIN TINCTURE PRP APPL 2/3 (GAUZE/BANDAGES/DRESSINGS) ×2 IMPLANT
BLADE SAGITTAL 25.0X1.19X90 (BLADE) ×2 IMPLANT
BLADE SURG ROTATE 9660 (MISCELLANEOUS) IMPLANT
BNDG ELASTIC 6X10 VLCR STRL LF (GAUZE/BANDAGES/DRESSINGS) ×2 IMPLANT
BNDG ESMARK 6X9 LF (GAUZE/BANDAGES/DRESSINGS)
BNDG GAUZE ELAST 4 BULKY (GAUZE/BANDAGES/DRESSINGS) ×4 IMPLANT
BOWL SMART MIX CTS (DISPOSABLE) ×2 IMPLANT
CAPT RP KNEE ×2 IMPLANT
CEMENT HV SMART SET (Cement) ×4 IMPLANT
COVER SURGICAL LIGHT HANDLE (MISCELLANEOUS) ×2 IMPLANT
CUFF TOURNIQUET SINGLE 34IN LL (TOURNIQUET CUFF) ×2 IMPLANT
CUFF TOURNIQUET SINGLE 44IN (TOURNIQUET CUFF) IMPLANT
DRAPE EXTREMITY T 121X128X90 (DRAPE) ×2 IMPLANT
DRAPE PROXIMA HALF (DRAPES) ×2 IMPLANT
DRAPE U-SHAPE 47X51 STRL (DRAPES) ×2 IMPLANT
DRSG ADAPTIC 3X8 NADH LF (GAUZE/BANDAGES/DRESSINGS) ×2 IMPLANT
DRSG PAD ABDOMINAL 8X10 ST (GAUZE/BANDAGES/DRESSINGS) ×2 IMPLANT
DURAPREP 26ML APPLICATOR (WOUND CARE) ×2 IMPLANT
ELECT REM PT RETURN 9FT ADLT (ELECTROSURGICAL) ×2
ELECTRODE REM PT RTRN 9FT ADLT (ELECTROSURGICAL) ×1 IMPLANT
FLUID NSS /IRRIG 3000 ML XXX (IV SOLUTION) ×2 IMPLANT
GLOVE BIO SURGEON STRL SZ8 (GLOVE) ×4 IMPLANT
GLOVE BIOGEL PI IND STRL 7.0 (GLOVE) ×1 IMPLANT
GLOVE BIOGEL PI IND STRL 8 (GLOVE) ×2 IMPLANT
GLOVE BIOGEL PI INDICATOR 7.0 (GLOVE) ×1
GLOVE BIOGEL PI INDICATOR 8 (GLOVE) ×2
GLOVE ECLIPSE 6.5 STRL STRAW (GLOVE) ×2 IMPLANT
GLOVE ECLIPSE 7.0 STRL STRAW (GLOVE) ×2 IMPLANT
GOWN STRL REUS W/ TWL LRG LVL3 (GOWN DISPOSABLE) ×1 IMPLANT
GOWN STRL REUS W/ TWL XL LVL3 (GOWN DISPOSABLE) ×3 IMPLANT
GOWN STRL REUS W/TWL 2XL LVL3 (GOWN DISPOSABLE) IMPLANT
GOWN STRL REUS W/TWL LRG LVL3 (GOWN DISPOSABLE) ×1
GOWN STRL REUS W/TWL XL LVL3 (GOWN DISPOSABLE) ×3
HANDPIECE INTERPULSE COAX TIP (DISPOSABLE) ×1
HOOD PEEL AWAY FACE SHEILD DIS (HOOD) ×6 IMPLANT
IMMOBILIZER KNEE 20 (SOFTGOODS) IMPLANT
IMMOBILIZER KNEE 22 UNIV (SOFTGOODS) ×2 IMPLANT
IMMOBILIZER KNEE 24 THIGH 36 (MISCELLANEOUS) IMPLANT
IMMOBILIZER KNEE 24 UNIV (MISCELLANEOUS)
KIT BASIN OR (CUSTOM PROCEDURE TRAY) ×2 IMPLANT
KIT ROOM TURNOVER OR (KITS) ×2 IMPLANT
MANIFOLD NEPTUNE II (INSTRUMENTS) ×2 IMPLANT
NEEDLE HYPO 21X1 ECLIPSE (NEEDLE) ×2 IMPLANT
NS IRRIG 1000ML POUR BTL (IV SOLUTION) ×2 IMPLANT
PACK TOTAL JOINT (CUSTOM PROCEDURE TRAY) ×2 IMPLANT
PAD ARMBOARD 7.5X6 YLW CONV (MISCELLANEOUS) ×4 IMPLANT
SET HNDPC FAN SPRY TIP SCT (DISPOSABLE) ×1 IMPLANT
SPONGE GAUZE 4X4 12PLY (GAUZE/BANDAGES/DRESSINGS) ×2 IMPLANT
STAPLER VISISTAT 35W (STAPLE) IMPLANT
STRIP CLOSURE SKIN 1/2X4 (GAUZE/BANDAGES/DRESSINGS) ×2 IMPLANT
SUCTION FRAZIER TIP 10 FR DISP (SUCTIONS) IMPLANT
SUT MNCRL AB 3-0 PS2 18 (SUTURE) ×2 IMPLANT
SUT VIC AB 0 CT1 27 (SUTURE) ×1
SUT VIC AB 0 CT1 27XBRD ANBCTR (SUTURE) ×1 IMPLANT
SUT VIC AB 2-0 CT1 27 (SUTURE) ×2
SUT VIC AB 2-0 CT1 TAPERPNT 27 (SUTURE) ×2 IMPLANT
SUT VIC AB 3-0 SH 8-18 (SUTURE) ×2 IMPLANT
SUT VLOC 180 0 24IN GS25 (SUTURE) ×2 IMPLANT
SYR 50ML LL SCALE MARK (SYRINGE) ×2 IMPLANT
TOWEL OR 17X24 6PK STRL BLUE (TOWEL DISPOSABLE) ×2 IMPLANT
TOWEL OR 17X26 10 PK STRL BLUE (TOWEL DISPOSABLE) ×2 IMPLANT
TRAY FOLEY CATH 14FR (SET/KITS/TRAYS/PACK) IMPLANT
WATER STERILE IRR 1000ML POUR (IV SOLUTION) IMPLANT

## 2014-03-11 NOTE — Progress Notes (Signed)
Utilization Review Completed.Donne Anon T7/23/2015

## 2014-03-11 NOTE — Op Note (Signed)
PREOP DIAGNOSIS: DJD RIGHT KNEE POSTOP DIAGNOSIS: same PROCEDURE: RIGHT TKR ANESTHESIA: Spinal and MAC ATTENDING SURGEON: Janaiah Vetrano G ASSISTANT: Loni Dolly PA  INDICATIONS FOR PROCEDURE: [name] is a 80 y.o. male who has struggled for a long time with pain due to degenerative arthritis of the right knee.  The patient has failed many conservative non-operative measures and at this point has pain which limits the ability to sleep and walk.  The patient is offered total knee replacement.  Informed operative consent was obtained after discussion of possible risks of anesthesia, infection, neurovascular injury, DVT, and death.  The importance of the post-operative rehabilitation protocol to optimize result was stressed extensively with the patient.  SUMMARY OF FINDINGS AND PROCEDURE:  [name] was taken to the operative suite where under the above anesthesia a right knee replacement was performed.  There were advanced degenerative changes and the bone quality was excellent.  We used the DePuy LCS system and placed size large femur, 6 tibia, 38 mm all polyethylene patella, and a size 15 mm spacer.  Loni Dolly PA-C assisted throughout and was invaluable to the completion of the case in that he helped retract and maintain exposure while I placed components.  He also helped close thereby minimizing OR time.  The patient was admitted for appropriate post-op care to include perioperative antibiotics and mechanical and pharmacologic measures for DVT prophylaxis.  DESCRIPTION OF PROCEDURE:  [name] was taken to the operative suite where the above anesthesia was applied.  The patient was positioned supine and prepped and draped in normal sterile fashion.  An appropriate time out was performed.  After the administration of Kefzol pre-op antibiotic the leg was elevated and exsanguinated and a tourniquet inflated. A standard longitudinal incision was made on the anterior knee.  Dissection was carried  down to the extensor mechanism.  All appropriate anti-infective measures were used including the pre-operative antibiotic, betadine impregnated drape, and closed hooded exhaust systems for each member of the surgical team.  A medial parapatellar incision was made in the extensor mechanism and the knee cap flipped and the knee flexed.  Some residual meniscal tissues were removed along with any remaining ACL/PCL tissue.  A guide was placed on the tibia and a flat cut was made on it's superior surface.  An intramedullary guide was placed in the femur and was utilized to make anterior and posterior cuts creating an appropriate flexion gap.  A second intramedullary guide was placed in the femur to make a distal cut properly balancing the knee with an extension gap equal to the flexion gap.  The three bones sized to the above mentioned sizes and the appropriate guides were placed and utilized.  A trial reduction was done and the knee easily came to full extension and the patella tracked well on flexion.  The trial components were removed and all bones were cleaned with pulsatile lavage and then dried thoroughly.  Cement was mixed and was pressurized onto the bones followed by placement of the aforementioned components.  Excess cement was trimmed and pressure was held on the components until the cement had hardened.  The tourniquet was deflated and a small amount of bleeding was controlled with cautery and pressure.  The knee was irrigated thoroughly.  The extensor mechanism was re-approximated with V-loc suture in running fashion.  The knee was flexed and the repair was solid.  The subcutaneous tissues were re-approximated with #0 and #2-0 vicryl and the skin closed with a  subcuticular stitch and steristrips.  A sterile dressing was applied.  Intraoperative fluids, EBL, and tourniquet time can be obtained from anesthesia records.  DISPOSITION:  The patient was taken to recovery room in stable condition and admitted for  appropriate post-op care to include peri-operative antibiotic and DVT prophylaxis with mechanical and pharmacologic measures.  Stevon Gough G 03/11/2014, 9:15 AM

## 2014-03-11 NOTE — Interval H&P Note (Signed)
History and Physical Interval Note:  03/11/2014 7:17 AM  [name]  has presented today for surgery, with the diagnosis of RIGHT KNEE DEGENERATIVE JOINT DISEASE  The various methods of treatment have been discussed with the patient and family. After consideration of risks, benefits and other options for treatment, the patient has consented to  Procedure(s): TOTAL KNEE ARTHROPLASTY (Right) as a surgical intervention .  The patient's history has been reviewed, patient examined, no change in status, stable for surgery.  I have reviewed the patient's chart and labs.  Questions were answered to the patient's satisfaction.     [name]

## 2014-03-11 NOTE — Plan of Care (Signed)
Problem: Consults Goal: Diagnosis- Total Joint Replacement Outcome: Completed/Met Date Met:  03/11/14 Primary Total Knee Right

## 2014-03-11 NOTE — Anesthesia Preprocedure Evaluation (Signed)
Anesthesia Evaluation  Patient identified by MRN, date of birth, ID band Patient awake    Reviewed: Allergy & Precautions, H&P , NPO status , Patient's Chart, lab work & pertinent test results  Airway       Dental   Pulmonary former smoker,          Cardiovascular hypertension, + CAD and + CABG + dysrhythmias     Neuro/Psych    GI/Hepatic hiatal hernia, GERD-  ,  Endo/Other    Renal/GU      Musculoskeletal  (+) Arthritis -,   Abdominal   Peds  Hematology   Anesthesia Other Findings   Reproductive/Obstetrics                           Anesthesia Physical Anesthesia Plan  ASA: III  Anesthesia Plan: General and Regional   Post-op Pain Management:    Induction: Intravenous  Airway Management Planned:   Additional Equipment:   Intra-op Plan:   Post-operative Plan:   Informed Consent: I have reviewed the patients History and Physical, chart, labs and discussed the procedure including the risks, benefits and alternatives for the proposed anesthesia with the patient or authorized representative who has indicated his/her understanding and acceptance.     Plan Discussed with: Surgeon, Anesthesiologist and CRNA  Anesthesia Plan Comments:         Anesthesia Quick Evaluation

## 2014-03-11 NOTE — Anesthesia Postprocedure Evaluation (Signed)
  Anesthesia Post-op Note  Patient: [name]  Procedure(s) Performed: Procedure(s): TOTAL KNEE ARTHROPLASTY (Right)  Patient Location: PACU  Anesthesia Type:Spinal  Level of Consciousness: awake, alert , oriented and patient cooperative  Airway and Oxygen Therapy: Patient Spontanous Breathing  Post-op Pain: none  Post-op Assessment: Post-op Vital signs reviewed, Patient's Cardiovascular Status Stable, Respiratory Function Stable, Patent Airway and No signs of Nausea or vomiting  Post-op Vital Signs: stable  Last Vitals:  Filed Vitals:   03/11/14 1115  BP: 132/77  Pulse: 56  Temp:   Resp: 16    Complications: No apparent anesthesia complications

## 2014-03-11 NOTE — Anesthesia Procedure Notes (Addendum)
Anesthesia Regional Block:   Narrative:    Spinal  Patient location during procedure: OR Start time: 03/11/2014 7:15 AM End time: 03/11/2014 7:25 AM Preanesthetic Checklist Completed: patient identified, site marked, surgical consent, pre-op evaluation, timeout performed, IV checked, risks and benefits discussed and monitors and equipment checked Spinal Block Patient position: right lateral decubitus Prep: Betadine Patient monitoring: heart rate, cardiac monitor, continuous pulse ox and blood pressure Approach: right paramedian Location: L3-4 Injection technique: single-shot Needle Needle type: Quincke  Needle gauge: 22 G Needle length: 9 cm Needle insertion depth: 2.5 cm Assessment Sensory level: T8 Additional Notes Pt accepts procedure w/ risks. 7.5 mg Marcaine w/ dextrose w/o difficulty ( slow flow CSF w/o blood ). GES

## 2014-03-11 NOTE — Progress Notes (Signed)
Orthopedic Tech Progress Note Patient Details:  [name] 06-05-1927 [id number]  CPM Right Knee CPM Right Knee: On Right Knee Flexion (Degrees): 60 Right Knee Extension (Degrees): 0   [name] 03/11/2014, 11:02 AM

## 2014-03-11 NOTE — Transfer of Care (Signed)
Immediate Anesthesia Transfer of Care Note  Patient: [name]  Procedure(s) Performed: Procedure(s): TOTAL KNEE ARTHROPLASTY (Right)  Patient Location: PACU  Anesthesia Type:Spinal  Level of Consciousness: awake, alert , oriented, patient cooperative and responds to stimulation  Airway & Oxygen Therapy: Patient Spontanous Breathing and Patient connected to nasal cannula oxygen  Post-op Assessment: Post -op Vital signs reviewed and stable  Post vital signs: Reviewed and stable  Complications: No apparent anesthesia complications

## 2014-03-12 ENCOUNTER — Encounter (HOSPITAL_COMMUNITY): Payer: Self-pay | Admitting: Orthopaedic Surgery

## 2014-03-12 LAB — BASIC METABOLIC PANEL
Anion gap: 9 (ref 5–15)
BUN: 16 mg/dL (ref 6–23)
CO2: 28 meq/L (ref 19–32)
Calcium: 9 mg/dL (ref 8.4–10.5)
Chloride: 96 mEq/L (ref 96–112)
Creatinine, Ser: 1.15 mg/dL (ref 0.50–1.35)
GFR calc Af Amer: 65 mL/min — ABNORMAL LOW (ref >90)
GFR calc non Af Amer: 56 mL/min — ABNORMAL LOW (ref >90)
Glucose, Bld: 104 mg/dL — ABNORMAL HIGH (ref 70–99)
Potassium: 4.7 mEq/L (ref 3.7–5.3)
Sodium: 133 mEq/L — ABNORMAL LOW (ref 137–147)

## 2014-03-12 LAB — CBC
HCT: 36.8 % — ABNORMAL LOW (ref 39.0–52.0)
Hemoglobin: 11.9 g/dL — ABNORMAL LOW (ref 13.0–17.0)
MCH: 29.4 pg (ref 26.0–34.0)
MCHC: 32.3 g/dL (ref 30.0–36.0)
MCV: 90.9 fL (ref 78.0–100.0)
Platelets: 231 10*3/uL (ref 150–400)
RBC: 4.05 MIL/uL — AB (ref 4.22–5.81)
RDW: 14.9 % (ref 11.5–15.5)
WBC: 10 10*3/uL (ref 4.0–10.5)

## 2014-03-12 MED ORDER — METHOCARBAMOL 500 MG PO TABS: 500.0000 mg | ORAL_TABLET | Freq: Four times a day (QID) | ORAL | Status: DC | PRN

## 2014-03-12 MED ORDER — ASPIRIN EC 325 MG PO TBEC: 325.0000 mg | DELAYED_RELEASE_TABLET | Freq: Two times a day (BID) | ORAL | Status: DC

## 2014-03-12 MED ORDER — HYDROCODONE-ACETAMINOPHEN 5-325 MG PO TABS: 1.0000 | ORAL_TABLET | ORAL | Status: DC | PRN

## 2014-03-12 NOTE — Discharge Summary (Signed)
Patient ID: [name] MRN: [id number] DOB/AGE: 1927/03/18 80 y.o.  Admit date: 03/11/2014 Discharge date: 03/15/14  Admission Diagnoses:  Principal Problem:   Right knee DJD   Discharge Diagnoses:  Same  Past Medical History  Diagnosis Date  . Prostate cancer   . GERD (gastroesophageal reflux disease)   . Hypertension   . Hyperlipemia   . Arthritis   . Inguinal hernia     left  . Coronary artery disease   . Dysrhythmia   . Shortness of breath   . Pneumonia   . H/O hiatal hernia     Surgeries: Procedure(s): TOTAL KNEE ARTHROPLASTY on 03/11/2014   Consultants:    Discharged Condition: Improved  Hospital Course: RAFFAEL BUGARIN is an 80 y.o. male who was admitted 03/11/2014 for operative treatment ofRight knee DJD. Patient has severe unremitting pain that affects sleep, daily activities, and work/hobbies. After pre-op clearance the patient was taken to the operating room on 03/11/2014 and underwent  Procedure(s): TOTAL KNEE ARTHROPLASTY.    Patient was given perioperative antibiotics: Anti-infectives   Start     Dose/Rate Route Frequency Ordered Stop   03/11/14 1530  ceFAZolin (ANCEF) IVPB 2 g/50 mL premix     2 g 100 mL/hr over 30 Minutes Intravenous Every 6 hours 03/11/14 1457 03/11/14 2134   03/11/14 0600  ceFAZolin (ANCEF) IVPB 2 g/50 mL premix     2 g 100 mL/hr over 30 Minutes Intravenous On call to O.R. 03/10/14 1452 03/11/14 0740       Patient was given sequential compression devices, early ambulation, and chemoprophylaxis to prevent DVT.  Patient benefited maximally from hospital stay and there were no complications.    Recent vital signs: Patient Vitals for the past 24 hrs:  BP Temp Temp src Pulse Resp SpO2  03/12/14 1316 120/48 mmHg 97.3 F (36.3 C) Oral 62 - 95 %  03/12/14 0600 123/66 mmHg 98.2 F (36.8 C) - 73 16 94 %  03/12/14 0400 - - - - 16 95 %  03/12/14 0140 115/55 mmHg 98.2 F (36.8 C) Oral 70 16 95 %  03/12/14 0000 - - - - 16 97 %  03/11/14  2100 135/59 mmHg 98.2 F (36.8 C) Oral 81 16 97 %  03/11/14 2000 - - - - 16 94 %  03/11/14 1505 162/73 mmHg 97.3 F (36.3 C) - 74 16 98 %     Recent laboratory studies:  Recent Labs  03/12/14 0600  WBC 10.0  HGB 11.9*  HCT 36.8*  PLT 231  NA 133*  K 4.7  CL 96  CO2 28  BUN 16  CREATININE 1.15  GLUCOSE 104*  CALCIUM 9.0     Discharge Medications:     Medication List         aspirin EC 325 MG tablet  Take 1 tablet (325 mg total) by mouth 2 (two) times daily at 8 am and 10 pm.     hydrochlorothiazide 25 MG tablet  Commonly known as:  HYDRODIURIL  Take 12.5 mg by mouth daily.     HYDROcodone-acetaminophen 5-325 MG per tablet  Commonly known as:  NORCO/VICODIN  Take 1-2 tablets by mouth every 4 (four) hours as needed (breakthrough pain).     lisinopril 20 MG tablet  Commonly known as:  PRINIVIL,ZESTRIL  Take 20 mg by mouth daily.     methocarbamol 500 MG tablet  Commonly known as:  ROBAXIN  Take 1 tablet (500 mg total) by mouth every 6 (six)  hours as needed for muscle spasms.     metoprolol succinate 25 MG 24 hr tablet  Commonly known as:  TOPROL-XL  Take 12.5 mg by mouth daily.     omeprazole 40 MG capsule  Commonly known as:  PRILOSEC  Take 40 mg by mouth daily.     oxybutynin 10 MG 24 hr tablet  Commonly known as:  DITROPAN-XL  Take 10 mg by mouth daily.     pramipexole 1 MG tablet  Commonly known as:  MIRAPEX  Take 1 mg by mouth at bedtime.     testosterone cypionate 200 MG/ML injection  Commonly known as:  DEPOTESTOTERONE CYPIONATE  Inject 300 mg into the muscle every 28 (twenty-eight) days.     Vitamin D3 1000 UNITS Caps  Take 1,000 Units by mouth daily.        Diagnostic Studies: No results found.  Disposition: 03-Skilled Nursing Facility      Discharge Instructions   Call MD / Call 911    Complete by:  As directed   If you experience chest pain or shortness of breath, CALL 911 and be transported to the hospital emergency room.  If  you develope a fever above 101 F, pus (white drainage) or increased drainage or redness at the wound, or calf pain, call your surgeon's office.     Constipation Prevention    Complete by:  As directed   Drink plenty of fluids.  Prune juice may be helpful.  You may use a stool softener, such as Colace (over the counter) 100 mg twice a day.  Use MiraLax (over the counter) for constipation as needed.     Diet - low sodium heart healthy    Complete by:  As directed      Increase activity slowly as tolerated    Complete by:  As directed            Follow-up Information   Follow up with Hessie Dibble, MD. Call in 2 weeks.   Specialty:  Orthopedic Surgery   Contact information:   [address] [place] [address] [phone]        Signed: NATION, CRADLE 03/12/2014, 2:42 PM

## 2014-03-12 NOTE — Evaluation (Signed)
Physical Therapy Evaluation Patient Details Name: [name] MRN: [id number] DOB: 1926-10-13 Today's Date: 03/12/2014   History of Present Illness  RTKR with PMHx of CABG in Jan in prep for TKR  Clinical Impression  Pt very motivated to work on amb and ROM.  Plan for pt to D/C to SNF prior to returning to home to maximize independence.  Will continue to follow.      Follow Up Recommendations SNF    Equipment Recommendations   (TBD)    Recommendations for Other Services       Precautions / Restrictions Precautions Precautions: Fall;Knee Required Braces or Orthoses: Knee Immobilizer - Right Knee Immobilizer - Right: On when out of bed or walking;Discontinue once straight leg raise with < 10 degree lag Restrictions Weight Bearing Restrictions: Yes RLE Weight Bearing: Weight bearing as tolerated      Mobility  Bed Mobility Overal bed mobility: Needs Assistance Bed Mobility: Supine to Sit     Supine to sit: Min assist     General bed mobility comments: A for RLE and VCs to sequence through steps  Transfers Overall transfer level: Needs assistance Equipment used: Rolling walker (2 wheeled) Transfers: Sit to/from Stand Sit to Stand: Min assist         General transfer comment: cues to control descent to sitting.    Ambulation/Gait Ambulation/Gait assistance: Min guard Ambulation Distance (Feet): 15 Feet Assistive device: Rolling walker (2 wheeled) Gait Pattern/deviations: Step-to pattern;Decreased step length - left;Decreased stance time - right     General Gait Details: pt fatigues easily and needs cueing for safe use of RW and upright posture.    Stairs            Wheelchair Mobility    Modified Rankin (Stroke Patients Only)       Balance Overall balance assessment: Needs assistance Sitting-balance support: Feet supported;No upper extremity supported Sitting balance-Leahy Scale: Good     Standing balance support: Bilateral upper extremity  supported Standing balance-Leahy Scale: Poor                               Pertinent Vitals/Pain 6/10 with ROM.  Premedicated.      Home Living Family/patient expects to be discharged to:: Skilled nursing facility                      Prior Function Level of Independence: Independent               Hand Dominance   Dominant Hand: Right    Extremity/Trunk Assessment   Upper Extremity Assessment: Defer to OT evaluation           Lower Extremity Assessment: RLE deficits/detail RLE Deficits / Details: Post-op weakness.         Communication   Communication: No difficulties  Cognition Arousal/Alertness: Awake/alert Behavior During Therapy: WFL for tasks assessed/performed Overall Cognitive Status: Within Functional Limits for tasks assessed                      General Comments      Exercises Total Joint Exercises Ankle Circles/Pumps: AROM;Both;10 reps Quad Sets: AROM;Right;10 reps Long Arc Quad: AAROM;Right;10 reps Knee Flexion: AAROM;Right;10 reps Goniometric ROM: ~10 - 75      Assessment/Plan    PT Assessment Patient needs continued PT services  PT Diagnosis Abnormality of gait;Acute pain   PT Problem List Decreased strength;Decreased range of  motion;Decreased activity tolerance;Decreased balance;Decreased mobility;Decreased knowledge of use of DME  PT Treatment Interventions DME instruction;Gait training;Functional mobility training;Stair training;Therapeutic activities;Therapeutic exercise;Balance training;Patient/family education   PT Goals (Current goals can be found in the Care Plan section) Acute Rehab PT Goals Patient Stated Goal: to rehab then home PT Goal Formulation: With patient Time For Goal Achievement: 03/26/14 Potential to Achieve Goals: Good    Frequency 7X/week   Barriers to discharge        Co-evaluation               End of Session Equipment Utilized During Treatment: Gait belt;Right  knee immobilizer Activity Tolerance: Patient tolerated treatment well Patient left: in chair;with call bell/phone within reach Nurse Communication: Mobility status         Time: 9574-7340 PT Time Calculation (min): 28 min   Charges:   PT Evaluation $Initial PT Evaluation Tier I: 1 Procedure PT Treatments $Gait Training: 8-22 mins $Therapeutic Exercise: 8-22 mins   PT G CodesCatarina Hartshorn, [place] 03/12/2014, 1:02 PM

## 2014-03-12 NOTE — Clinical Social Work Placement (Signed)
Clinical Social Work Department CLINICAL SOCIAL WORK PLACEMENT NOTE 03/12/2014  Patient:  [name], [name]  Account Number:  [id number] Admit date:  03/11/2014  Clinical Social Worker:  Barbette Or, LCSW  Date/time:  03/12/2014 03:30 PM  Clinical Social Work is seeking post-discharge placement for this patient at the following level of care:   SKILLED NURSING   (*CSW will update this form in Epic as items are completed)   03/12/2014  Patient/family provided with [facility] Department of Clinical Social Work's list of facilities offering this level of care within the geographic area requested by the patient (or if unable, by the patient's family).  03/12/2014  Patient/family informed of their freedom to choose among providers that offer the needed level of care, that participate in Medicare, Medicaid or managed care program needed by the patient, have an available bed and are willing to accept the patient.  03/12/2014  Patient/family informed of [facility]' ownership interest in [facility], as well as of the fact that they are under no obligation to receive care at this facility.  PASARR submitted to EDS on  PASARR number received on   FL2 transmitted to all facilities in geographic area requested by pt/family on  03/12/2014 FL2 transmitted to all facilities within larger geographic area on   Patient informed that his/her managed care company has contracts with or will negotiate with  certain facilities, including the following:     Patient/family informed of bed offers received:  03/12/2014 Patient chooses bed at [place] Physician recommends and patient chooses bed at    Patient to be transferred to [facility] on  03/15/2014 Patient to be transferred to facility by  Patient and family notified of transfer on  Name of family member notified:    The following physician request were entered in Epic:   Additional Comments: 07/24 - Patient with pre  existing Pasarr

## 2014-03-12 NOTE — Evaluation (Signed)
Occupational Therapy Evaluation and Discharge Patient Details Name: [name] MRN: [id number] DOB: 1926/12/29 Today's Date: 03/12/2014    History of Present Illness RTKR with PMHx of CABG in Jan in prep for TKR   Clinical Impression   This 80 yo male admitted and underwent above presents to acute OT with decreased balance, decreased mobility, increased pain, decreased ROM RLE all affecting pt's ability to care for himself. Pt will benefit from continued OT at SNF.   Follow Up Recommendations  SNF    Equipment Recommendations   (TBD at next venue)       Precautions / Restrictions Precautions Precautions: Fall;Knee Required Braces or Orthoses: Knee Immobilizer - Right Knee Immobilizer - Right: On except when in CPM Restrictions Weight Bearing Restrictions: No RLE Weight Bearing: Weight bearing as tolerated      Mobility Bed Mobility Overal bed mobility: Needs Assistance Bed Mobility: Supine to Sit     Supine to sit: Min assist     General bed mobility comments: A for RLE and VCs to sequence through steps  Transfers Overall transfer level: Needs assistance Equipment used: Rolling walker (2 wheeled) Transfers: Sit to/from Stand Sit to Stand: Min assist         General transfer comment: VCs for safe hand placement    Balance Overall balance assessment: Needs assistance Sitting-balance support: Feet supported;No upper extremity supported Sitting balance-Leahy Scale: Good     Standing balance support: Bilateral upper extremity supported Standing balance-Leahy Scale: Poor                              ADL Overall ADL's : Needs assistance/impaired Eating/Feeding: Independent;Sitting   Grooming: Set up;Sitting   Upper Body Bathing: Set up;Sitting   Lower Body Bathing: Moderate assistance;Sit to/from stand   Upper Body Dressing : Set up;Sitting   Lower Body Dressing: Maximal assistance;Sit to/from stand   Toilet Transfer: Minimal  assistance;Ambulation;RW (Bed>couch to sit in recliner behind him)   Toileting- Water quality scientist and Hygiene: Minimal assistance;Sit to/from stand                         Pertinent Vitals/Pain 5/10 in knee; with activity; repositioned, ice applied, RN made aware     Hand Dominance Right   Extremity/Trunk Assessment Upper Extremity Assessment Upper Extremity Assessment: Overall WFL for tasks assessed   Lower Extremity Assessment Lower Extremity Assessment: Defer to PT evaluation       Communication Communication Communication: No difficulties   Cognition Arousal/Alertness: Awake/alert Behavior During Therapy: WFL for tasks assessed/performed Overall Cognitive Status: Within Functional Limits for tasks assessed                                Home Living Family/patient expects to be discharged to:: Skilled nursing facility                                        Prior Functioning/Environment Level of Independence: Independent                OT Problem List: Decreased range of motion;Decreased strength;Pain;Decreased knowledge of use of DME or AE;Impaired balance (sitting and/or standing)      OT Goals(Current goals can be found in the care plan section) Acute Rehab OT Goals  Patient Stated Goal: to rehab then home  OT Frequency:                End of Session Equipment Utilized During Treatment: Gait belt;Rolling walker;Right knee immobilizer Nurse Communication: Patient requests pain meds;Mobility status  Activity Tolerance: Patient tolerated treatment well Patient left: in chair;with call bell/phone within reach;with family/visitor present   Time: 4037-0964 OT Time Calculation (min): 27 min Charges:  OT General Charges $OT Visit: 1 Procedure OT Evaluation $Initial OT Evaluation Tier I: 1 Procedure OT Treatments $Self Care/Home Management : 8-22 mins  Almon Register [phone] 03/12/2014, 9:51 AM

## 2014-03-12 NOTE — Clinical Social Work Note (Signed)
Clinical Social Work Department BRIEF PSYCHOSOCIAL ASSESSMENT 03/12/2014  Patient:  [name], [name]     Account Number:  [id number]     Admit date:  03/11/2014  Clinical Social Worker:  Myles Lipps  Date/Time:  03/12/2014 03:30 PM  Referred by:  Care Management  Date Referred:  03/12/2014 Referred for  SNF Placement   Other Referral:   Interview type:  Patient Other interview type:   No family/friends at bedside    PSYCHOSOCIAL DATA Living Status:  ALONE Admitted from facility:   Level of care:   Primary support name:  Rhea, Thrun  [phone] Primary support relationship to patient:  CHILD, ADULT Degree of support available:   Adequate    CURRENT CONCERNS Current Concerns  Post-Acute Placement   Other Concerns:    SOCIAL WORK ASSESSMENT / PLAN Clinical Social Worker met with patient at bedside to offer support and discuss patient needs at discharge.  Patient states that he has made arrangements and pre-registered for placement at [facility] following his surgery. Patient lives at home alone and have very little local assistance.  CSW has submitted all clinical information to facility to initiate [facility], however approval not yet provided.  Patient is not safe to return home alone.  CSW offered patient the opportunity for private pay over the weekend and/or another facility choice.  Patient states that he cannot afford private pay rate and will not go to a facility in [place] away from his family and friends.    CSW to remain available for support and to facilitate patient discharge needs once insurance authorization is obtained.  Patient will likely discharge Monday (03/15/14) with [facility] approval.   Assessment/plan status:  Psychosocial Support/Ongoing Assessment of Needs Other assessment/ plan:   Information/referral to community resources:   CSW offered patient private pay opportunity and/or alternative facility choice, however neither one  acceptable to patient.    PATIENT'S/FAMILY'S RESPONSE TO PLAN OF CARE: Patient alert and oriented x3 laying in bed.  Patient is furious regarding the [employer] and inability to discharge over the weekend.  Patient requested insurance customer service numbers to file formal complaint - numbers provided.  Patient states that he made all arrangements prior to surgery for a reason and there is not excuse that he should not be able to go.  Patient very appreciative of CSW support and assistance, however remains frustrated at the situation.

## 2014-03-12 NOTE — Progress Notes (Signed)
Subjective: 1 Day Post-Op Procedure(s) (LRB): TOTAL KNEE ARTHROPLASTY (Right)  Activity level:  wbat Diet tolerance:  eating Voiding:  ok Patient reports pain as mild.    Objective: Vital signs in last 24 hours: Temp:  [97 F (36.1 C)-98.2 F (36.8 C)] 98.2 F (36.8 C) (07/24 0600) Pulse Rate:  [48-81] 73 (07/24 0600) Resp:  [11-24] 16 (07/24 0600) BP: (115-162)/(48-82) 123/66 mmHg (07/24 0600) SpO2:  [94 %-100 %] 94 % (07/24 0600)  Labs:  Recent Labs  03/12/14 0600  HGB 11.9*    Recent Labs  03/12/14 0600  WBC 10.0  RBC 4.05*  HCT 36.8*  PLT 231    Recent Labs  03/12/14 0600  NA 133*  K 4.7  CL 96  CO2 28  BUN 16  CREATININE 1.15  GLUCOSE 104*  CALCIUM 9.0   No results found for this basename: LABPT, INR,  in the last 72 hours  Physical Exam:  Neurologically intact ABD soft Neurovascular intact Sensation intact distally Intact pulses distally Dorsiflexion/Plantar flexion intact Incision: dressing C/D/I No cellulitis present Compartment soft  Assessment/Plan:  1 Day Post-Op Procedure(s) (LRB): TOTAL KNEE ARTHROPLASTY (Right) Advance diet Up with therapy D/C IV fluids Plan for discharge tomorrow Discharge to SNF in [place] change to mepilex. Continue on ASA 325mg  BID x 2 weeks Follow up in office 2 weeks post op.     Sira Adsit, CINCH ORMOND 03/12/2014, 8:12 AM

## 2014-03-13 LAB — CBC
HCT: 36.8 % — ABNORMAL LOW (ref 39.0–52.0)
Hemoglobin: 12 g/dL — ABNORMAL LOW (ref 13.0–17.0)
MCH: 29.6 pg (ref 26.0–34.0)
MCHC: 32.6 g/dL (ref 30.0–36.0)
MCV: 90.6 fL (ref 78.0–100.0)
PLATELETS: 230 10*3/uL (ref 150–400)
RBC: 4.06 MIL/uL — ABNORMAL LOW (ref 4.22–5.81)
RDW: 15.1 % (ref 11.5–15.5)
WBC: 11.3 10*3/uL — ABNORMAL HIGH (ref 4.0–10.5)

## 2014-03-13 NOTE — Progress Notes (Signed)
Per [facility] admissions coordinator at [place] has no been revived and she will contact [employer] on Monday 03/15/14. Debbie reported they [facility] does not take LOG's. CSW will continue to follow and assist as needed.   Blima Rich, [place] Weekend CSW [phone]

## 2014-03-13 NOTE — Progress Notes (Signed)
Subjective: 2 Days Post-Op Procedure(s) (LRB): TOTAL KNEE ARTHROPLASTY (Right)  Activity level:  wbat Diet tolerance:  eating Voiding:  ok Patient reports pain as mild.    Objective: Vital signs in last 24 hours: Temp:  [97.3 F (36.3 C)-98.5 F (36.9 C)] 98.4 F (36.9 C) (07/25 0644) Pulse Rate:  [62-85] 85 (07/25 0644) Resp:  [16-18] 18 (07/25 0644) BP: (119-124)/(48-62) 124/62 mmHg (07/25 0644) SpO2:  [95 %-97 %] 97 % (07/25 0644)  Labs:  Recent Labs  03/12/14 0600 03/13/14 0458  HGB 11.9* 12.0*    Recent Labs  03/12/14 0600 03/13/14 0458  WBC 10.0 11.3*  RBC 4.05* 4.06*  HCT 36.8* 36.8*  PLT 231 230    Recent Labs  03/12/14 0600  NA 133*  K 4.7  CL 96  CO2 28  BUN 16  CREATININE 1.15  GLUCOSE 104*  CALCIUM 9.0   No results found for this basename: LABPT, INR,  in the last 72 hours  Physical Exam:  Neurologically intact ABD soft Neurovascular intact Sensation intact distally Intact pulses distally Dorsiflexion/Plantar flexion intact Incision: dressing C/D/I No cellulitis present Compartment soft  Assessment/Plan:  2 Days Post-Op Procedure(s) (LRB): TOTAL KNEE ARTHROPLASTY (Right) Apparently insurance has been slow to authorize SNF in [place], so although patient is medically stable and ready for d/c, plan on-hold until auth received. Continue on ASA 325mg  BID x 2 weeks Follow up in office 2 weeks post op.     Chevie Birkhead A. 03/13/2014, 8:26 AM

## 2014-03-13 NOTE — Progress Notes (Signed)
Physical Therapy Treatment Patient Details Name: ASER NYLUND MRN: [id number] DOB: [birth year] Today's Date: 03/13/2014    History of Present Illness RTKR with PMHx of CABG in Jan in prep for TKR    PT Comments    Patient progressing with overall mobility. Able to increase therex this morning with no increase in pain. Patient awaiting SNF placement potentially for Monday at [facility] in [place]  Follow Up Recommendations  SNF     Equipment Recommendations       Recommendations for Other Services       Precautions / Restrictions Precautions Precautions: Fall;Knee Required Braces or Orthoses: Knee Immobilizer - Right Knee Immobilizer - Right: On when out of bed or walking;Discontinue once straight leg raise with < 10 degree lag Restrictions Weight Bearing Restrictions: Yes RLE Weight Bearing: Weight bearing as tolerated    Mobility  Bed Mobility               General bed mobility comments: Patient up in recliner upon entering room  Transfers Overall transfer level: Needs assistance Equipment used: Rolling walker (2 wheeled)   Sit to Stand: Min assist         General transfer comment: A for balance. Cues for safe technique  Ambulation/Gait Ambulation/Gait assistance: Min guard Ambulation Distance (Feet): 90 Feet Assistive device: Rolling walker (2 wheeled) Gait Pattern/deviations: Step-through pattern;Decreased stride length     General Gait Details: Cues for RW management and safety. Patient starting to ambulate with step through pattern   Stairs            Wheelchair Mobility    Modified Rankin (Stroke Patients Only)       Balance                                    Cognition Arousal/Alertness: Awake/alert Behavior During Therapy: WFL for tasks assessed/performed Overall Cognitive Status: Within Functional Limits for tasks assessed                      Exercises Total Joint Exercises Quad Sets: AROM;Right;10  reps Short Arc QuadSinclair Ship;Right;10 reps Heel Slides: AAROM;Right;10 reps Hip ABduction/ADduction: AAROM;Right;10 reps Straight Leg Raises: AAROM;Right;10 reps Knee Flexion: AAROM;Right;10 reps    General Comments        Pertinent Vitals/Pain 4/10 L knee pain. patient repositioned for comfort     Home Living                      Prior Function            PT Goals (current goals can now be found in the care plan section) Progress towards PT goals: Progressing toward goals    Frequency  7X/week    PT Plan Current plan remains appropriate    Co-evaluation             End of Session Equipment Utilized During Treatment: Gait belt;Right knee immobilizer Activity Tolerance: Patient tolerated treatment well Patient left: in chair;with call bell/phone within reach     Time: 1015-1040 PT Time Calculation (min): 25 min  Charges:  $Gait Training: 8-22 mins $Therapeutic Exercise: 8-22 mins                    G Codes:      Jacqualyn Posey 03/13/2014, 10:45 AM  03/13/2014 Jacqualyn Posey PTA [phone] pager [phone] office

## 2014-03-13 NOTE — Progress Notes (Signed)
Orthopedic Tech Progress Note Patient Details:  [name] November 01, 1926 [id number] On cpm at 7:40 pm RLE  Patient ID: [name], male   DOB: Dec 18, 1926, 80 y.o.   MRN: [id number]   [name] 03/13/2014, 7:45 PM

## 2014-03-13 NOTE — Care Management Note (Signed)
CARE MANAGEMENT NOTE 03/13/2014  Patient:  HAVOC, SANLUIS   Account Number:  [id number]  Date Initiated:  03/13/2014  Documentation initiated by:  Ricki Miller  Subjective/Objective Assessment:   80 yr old male s/p right total knee arthroplasty.     Action/Plan:   Patient is for shortterm rehab at [facility]. Wants to go to [facility] [place]. Social worker is aware.   Anticipated DC Date:  03/15/2014   Anticipated DC Plan:  SKILLED NURSING FACILITY  In-house referral  Clinical Social Worker      DC Planning Services  CM consult      Choice offered to / List presented to:     DME arranged  NA        [place] arranged  NA      Status of service:  Completed, signed off Medicare Important Message given?  YES (If response is "NO", the following Medicare IM given date fields will be blank) Date Medicare IM given:  03/13/2014 Medicare IM given by:  Ricki Miller Date Additional Medicare IM given:   Additional Medicare IM given by:    Discharge Disposition:  [place]  Per UR Regulation:  Reviewed for med. necessity/level of care/duration of stay

## 2014-03-14 LAB — CBC
HCT: 35.4 % — ABNORMAL LOW (ref 39.0–52.0)
Hemoglobin: 11.6 g/dL — ABNORMAL LOW (ref 13.0–17.0)
MCH: 29.5 pg (ref 26.0–34.0)
MCHC: 32.8 g/dL (ref 30.0–36.0)
MCV: 90.1 fL (ref 78.0–100.0)
Platelets: 240 10*3/uL (ref 150–400)
RBC: 3.93 MIL/uL — ABNORMAL LOW (ref 4.22–5.81)
RDW: 15.1 % (ref 11.5–15.5)
WBC: 8 10*3/uL (ref 4.0–10.5)

## 2014-03-14 NOTE — Progress Notes (Signed)
Subjective: 3 Days Post-Op Procedure(s) (LRB): TOTAL KNEE ARTHROPLASTY (Right)  Activity level:  wbat Diet tolerance:  eating Voiding:  ok Patient reports pain as mild, sitting in chair, fully dressed.  Objective: Vital signs in last 24 hours: Temp:  [97.8 F (36.6 C)-98.1 F (36.7 C)] 97.9 F (36.6 C) (07/26 0545) Pulse Rate:  [78-102] 83 (07/26 0545) Resp:  [16-20] 16 (07/26 0800) BP: (117-139)/(61-69) 117/62 mmHg (07/26 0545) SpO2:  [97 %-99 %] 99 % (07/26 0800)  Labs:  Recent Labs  03/12/14 0600 03/13/14 0458 03/14/14 0413  HGB 11.9* 12.0* 11.6*    Recent Labs  03/13/14 0458 03/14/14 0413  WBC 11.3* 8.0  RBC 4.06* 3.93*  HCT 36.8* 35.4*  PLT 230 240    Recent Labs  03/12/14 0600  NA 133*  K 4.7  CL 96  CO2 28  BUN 16  CREATININE 1.15  GLUCOSE 104*  CALCIUM 9.0   No results found for this basename: LABPT, INR,  in the last 72 hours  Physical Exam:  Neurologically intact ABD soft Neurovascular intact Sensation intact distally Intact pulses distally Dorsiflexion/Plantar flexion intact Incision: dressing C/D/I No cellulitis present Compartment soft  Assessment/Plan:  3 Days Post-Op Procedure(s) (LRB): TOTAL KNEE ARTHROPLASTY (Right) Apparently insurance has not authorized SNF in [place], so although patient is medically stable and ready for d/c, plan on-hold until auth received. Continue on ASA 325mg  BID x 2 weeks Follow up in office 2 weeks post op.     Stephon Weathers A. 03/14/2014, 10:53 AM

## 2014-03-14 NOTE — Progress Notes (Signed)
Physical Therapy Treatment Patient Details Name: AB LEAMING MRN: [id number] DOB: 1926/11/14 Today's Date: 03/14/2014    History of Present Illness RTKR with PMHx of CABG in Jan in prep for TKR    PT Comments    Pt progressing towards physical therapy goals, ambulating up to 115 feet with min assist-min guard today. No instances of knee buckling during ambulatory bout and is tolerating therapeutic exercises well. Patient will continue to benefit from skilled physical therapy services at SNF to further improve independence with functional mobility.   Follow Up Recommendations  SNF     Equipment Recommendations   (TBD)    Recommendations for Other Services       Precautions / Restrictions Precautions Precautions: Fall;Knee Required Braces or Orthoses: Knee Immobilizer - Right Knee Immobilizer - Right: On when out of bed or walking;Discontinue once straight leg raise with < 10 degree lag Restrictions Weight Bearing Restrictions: Yes RLE Weight Bearing: Weight bearing as tolerated    Mobility  Bed Mobility Overal bed mobility: Modified Independent             General bed mobility comments: Requires extra time. No physical assist needed  Transfers Overall transfer level: Needs assistance Equipment used: Rolling walker (2 wheeled) Transfers: Sit to/from Stand Sit to Stand: Min guard         General transfer comment: Min guard for safety. Frequent VC for hand and walker placement when performing sit<>stand. Performed from lowest bed setting x 2 and recliner.  Ambulation/Gait Ambulation/Gait assistance: Min assist;Min guard Ambulation Distance (Feet): 115 Feet (x2) Assistive device: Rolling walker (2 wheeled) Gait Pattern/deviations: Step-through pattern;Decreased step length - left;Decreased stance time - right;Antalgic;Trunk flexed     General Gait Details: Intermittent VC for upright posture and RW placement within base of support. Educated on safe DME use.  Progressed from [place] assist to close min guard during second bout. Requires standing rest break half-way through distance during both bouts.   Stairs            Wheelchair Mobility    Modified Rankin (Stroke Patients Only)       Balance                                    Cognition Arousal/Alertness: Awake/alert Behavior During Therapy: WFL for tasks assessed/performed Overall Cognitive Status: Within Functional Limits for tasks assessed                      Exercises Total Joint Exercises Ankle Circles/Pumps: AROM;Both;10 reps;Seated Quad Sets: AROM;Right;10 reps;Seated Heel Slides: AAROM;Right;10 reps;Seated Long Arc Quad: AAROM;Right;10 reps;Seated Goniometric ROM: 4-90 degrees right knee flexion in seated position    General Comments        Pertinent Vitals/Pain Pt reports pain as "soreness and tight" no numerical value given. Patient repositioned in chair for comfort.     Home Living                      Prior Function            PT Goals (current goals can now be found in the care plan section) Acute Rehab PT Goals PT Goal Formulation: With patient Time For Goal Achievement: 03/26/14 Potential to Achieve Goals: Good Progress towards PT goals: Progressing toward goals    Frequency  7X/week    PT Plan Current plan remains appropriate  Co-evaluation             End of Session   Activity Tolerance: Patient tolerated treatment well Patient left: in chair;with call bell/phone within reach     Time: 7282-0601 PT Time Calculation (min): 20 min  Charges:  $Gait Training: 8-22 mins                    G Codes:      Elayne Snare, [place]  Ellouise Newer 03/14/2014, 2:45 PM

## 2014-03-15 ENCOUNTER — Encounter (HOSPITAL_COMMUNITY): Payer: Self-pay | Admitting: General Practice

## 2014-03-15 MED ORDER — HYDROCHLOROTHIAZIDE 12.5 MG PO CAPS
12.5000 mg | ORAL_CAPSULE | Freq: Every day | ORAL | Status: DC
  Administered 2014-03-15: 12.5 mg via ORAL
  Filled 2014-03-15: qty 1

## 2014-03-15 NOTE — Progress Notes (Signed)
CSW spoke with pt/pt.'s family regarding discharge today. Pt has been accepted at [facility]. Pt has not received authorization thru [facility] as of yet. Letter of Guarantee given and accepted by facility. Pt.'s son will transport. Packet prepared (FL2,prescriptions, H&P) and given to pt.'s son. No further CSW intervention needed.   [address] [place]

## 2014-03-15 NOTE — Progress Notes (Signed)
Physical Therapy Treatment Patient Details Name: [name] MRN: [id number] DOB: 09/01/1926 Today's Date: 03/15/2014    History of Present Illness RTKR with PMHx of CABG in Jan in prep for TKR    PT Comments    Patient progressing well with ambulation this session. Minor cues for use of RW. Patient able to increase therex and SLR with no assistance. Planning to DC to SNF later today.   Follow Up Recommendations  SNF     Equipment Recommendations       Recommendations for Other Services       Precautions / Restrictions Precautions Precautions: Fall;Knee Required Braces or Orthoses: Knee Immobilizer - Right Knee Immobilizer - Right: On when out of bed or walking;Discontinue once straight leg raise with < 10 degree lag Restrictions RLE Weight Bearing: Weight bearing as tolerated    Mobility  Bed Mobility               General bed mobility comments: Paitent up in recliner upon entering  Transfers Overall transfer level: Needs assistance Equipment used: Rolling walker (2 wheeled)   Sit to Stand: Supervision         General transfer comment: Cues for hand placement and positoning prior to sitting and not to leave the RW behind  Ambulation/Gait Ambulation/Gait assistance: Min guard Ambulation Distance (Feet): 200 Feet Assistive device: Rolling walker (2 wheeled) Gait Pattern/deviations: Step-through pattern;Decreased stride length     General Gait Details: Cues to not rely and lean onto RW and to maintain upright posture. Required standing rest break   Stairs            Wheelchair Mobility    Modified Rankin (Stroke Patients Only)       Balance                                    Cognition Arousal/Alertness: Awake/alert Behavior During Therapy: WFL for tasks assessed/performed Overall Cognitive Status: Within Functional Limits for tasks assessed                      Exercises Total Joint Exercises Quad Sets:  AROM;Right;Seated;15 reps Heel Slides: AAROM;Right;Seated;15 reps Hip ABduction/ADduction: Right;15 reps;AAROM Straight Leg Raises: Right;15 reps;AROM Long Arc Quad: Right;Seated;15 reps;AROM Goniometric ROM: 95 degrees knee flexion in seated position    General Comments        Pertinent Vitals/Pain no apparent distress     Home Living                      Prior Function            PT Goals (current goals can now be found in the care plan section) Progress towards PT goals: Progressing toward goals    Frequency  7X/week    PT Plan Current plan remains appropriate    Co-evaluation             End of Session Equipment Utilized During Treatment: Gait belt Activity Tolerance: Patient tolerated treatment well Patient left: in chair;with call bell/phone within reach     Time: 1159-1226 PT Time Calculation (min): 27 min  Charges:  $Gait Training: 8-22 mins $Therapeutic Exercise: 8-22 mins                    G Codes:      Jacqualyn Posey 03/15/2014, 1:01 PM  03/15/2014 [place], Tonia Brooms  PTA [phone] pager [phone] office

## 2014-03-15 NOTE — Progress Notes (Signed)
CSW received call from [facility] at [place] who reported that [facility] is requesting more information before a determination can be made. CSW faxed information over. Awaiting reponse.   Juliann Mule [phone]

## 2014-03-15 NOTE — Progress Notes (Signed)
Subjective: 4 Days Post-Op Procedure(s) (LRB): TOTAL KNEE ARTHROPLASTY (Right)  Activity level:  wbat Diet tolerance:  Eating well Voiding:  ok Patient reports pain as mild.    Objective: Vital signs in last 24 hours: Temp:  [98.1 F (36.7 C)-99.2 F (37.3 C)] 98.2 F (36.8 C) (07/27 0627) Pulse Rate:  [71-110] 71 (07/27 0627) Resp:  [18] 18 (07/27 0800) BP: (128-149)/(65-72) 128/65 mmHg (07/27 0627) SpO2:  [96 %-98 %] 96 % (07/27 0627)  Labs:  Recent Labs  03/13/14 0458 03/14/14 0413  HGB 12.0* 11.6*    Recent Labs  03/13/14 0458 03/14/14 0413  WBC 11.3* 8.0  RBC 4.06* 3.93*  HCT 36.8* 35.4*  PLT 230 240   No results found for this basename: NA, K, CL, CO2, BUN, CREATININE, GLUCOSE, CALCIUM,  in the last 72 hours No results found for this basename: LABPT, INR,  in the last 72 hours  Physical Exam:  Neurologically intact ABD soft Neurovascular intact Sensation intact distally Intact pulses distally Dorsiflexion/Plantar flexion intact Incision: dressing C/D/I No cellulitis present Compartment soft  Assessment/Plan:  4 Days Post-Op Procedure(s) (LRB): TOTAL KNEE ARTHROPLASTY (Right) Up with therapy Discharge to SNF in [place] today. Continue on ASA 325mg  BID x 2 weeks post op for DVT prevention. Follow up in office 2 weeks post op.     [name], [name] 03/15/2014, 8:38 AM

## 2014-03-16 NOTE — Addendum Note (Signed)
Addendum created 03/16/14 1040 by Kate Sable, MD   Modules edited: Anesthesia Events

## 2014-03-22 ENCOUNTER — Telehealth: Payer: Self-pay | Admitting: Family Medicine

## 2014-03-22 NOTE — Telephone Encounter (Signed)
Patient has appt with Laurance Flatten on aug 25 at 9:30 and said that he will just keep that appt

## 2014-03-30 ENCOUNTER — Telehealth: Payer: Self-pay | Admitting: Cardiology

## 2014-03-30 NOTE — Telephone Encounter (Signed)
° °  JUST A FYI!  Kendra with [facility] calling with a BP of 150/90. Any questions please call Tillie Rung.

## 2014-04-13 ENCOUNTER — Encounter: Payer: Self-pay | Admitting: Family Medicine

## 2014-04-13 ENCOUNTER — Ambulatory Visit (INDEPENDENT_AMBULATORY_CARE_PROVIDER_SITE_OTHER): Payer: Medicare PPO | Admitting: Family Medicine

## 2014-04-13 VITALS — BP 164/84 | HR 52 | Temp 97.1°F | Ht 70.0 in | Wt 161.0 lb

## 2014-04-13 DIAGNOSIS — Z96651 Presence of right artificial knee joint: Secondary | ICD-10-CM

## 2014-04-13 DIAGNOSIS — E785 Hyperlipidemia, unspecified: Secondary | ICD-10-CM

## 2014-04-13 DIAGNOSIS — E559 Vitamin D deficiency, unspecified: Secondary | ICD-10-CM

## 2014-04-13 DIAGNOSIS — C61 Malignant neoplasm of prostate: Secondary | ICD-10-CM

## 2014-04-13 DIAGNOSIS — E291 Testicular hypofunction: Secondary | ICD-10-CM

## 2014-04-13 DIAGNOSIS — E039 Hypothyroidism, unspecified: Secondary | ICD-10-CM

## 2014-04-13 DIAGNOSIS — E349 Endocrine disorder, unspecified: Secondary | ICD-10-CM

## 2014-04-13 DIAGNOSIS — Z96659 Presence of unspecified artificial knee joint: Secondary | ICD-10-CM

## 2014-04-13 DIAGNOSIS — I10 Essential (primary) hypertension: Secondary | ICD-10-CM

## 2014-04-13 LAB — POCT CBC
Granulocyte percent: 86.7 %G — AB (ref 37–80)
HCT, POC: 43.4 % — AB (ref 43.5–53.7)
HEMOGLOBIN: 13.9 g/dL — AB (ref 14.1–18.1)
LYMPH, POC: 1 (ref 0.6–3.4)
MCH: 29.4 pg (ref 27–31.2)
MCHC: 32.2 g/dL (ref 31.8–35.4)
MCV: 91.4 fL (ref 80–97)
MPV: 7.8 fL (ref 0–99.8)
PLATELET COUNT, POC: 266 10*3/uL (ref 142–424)
POC Granulocyte: 7.5 — AB (ref 2–6.9)
POC LYMPH %: 11.8 % (ref 10–50)
RBC: 4.7 M/uL (ref 4.69–6.13)
RDW, POC: 14.8 %
WBC: 8.6 10*3/uL (ref 4.6–10.2)

## 2014-04-13 LAB — POCT UA - MICROSCOPIC ONLY
Bacteria, U Microscopic: NEGATIVE
CASTS, UR, LPF, POC: NEGATIVE
Crystals, Ur, HPF, POC: NEGATIVE
Mucus, UA: NEGATIVE
RBC, urine, microscopic: NEGATIVE
WBC, Ur, HPF, POC: NEGATIVE
YEAST UA: NEGATIVE

## 2014-04-13 LAB — POCT UA - MICROALBUMIN: Microalbumin Ur, POC: NEGATIVE mg/L

## 2014-04-13 NOTE — Progress Notes (Signed)
Subjective:    Patient ID: [name], male    DOB: December 06, 1926, 80 y.o.   MRN: [id number]  HPI Pt here for follow up and management of chronic medical problems. The patient has recently had his knee replaced and he is doing well with this. He has a history of a prostatectomy in 1998. He has been taking testosterone injections because of fatigue and tiredness and the cause of his low testosterone. He will get lab work today. We will also get a rectal exam today. Following his knee surgery he was placed on thyroid medication while he was in a treatment facility. He stopped this medication and has not had it since the end of July.          Patient Active Problem List   Diagnosis Date Noted  . Right knee DJD 03/11/2014  . S/P CABG x 4 09/03/2013  . Insomnia 07/27/2013  . Testosterone deficiency 07/27/2013  . Osteoarthritis of right knee 07/27/2013  . Hyperlipidemia 06/10/2013  . Heart murmur 06/10/2013  . Dyspnea 06/10/2013  . Inguinal hernia, left. 04/04/2012  . Paraesophageal hiatal hernia 03/24/2012  . Prostate cancer 01/15/2012  . Special screening for malignant neoplasms, colon 01/15/2012   Outpatient Encounter Prescriptions as of 04/13/2014  Medication Sig  . aspirin EC 325 MG tablet Take 1 tablet (325 mg total) by mouth 2 (two) times daily at 8 am and 10 pm.  . Cholecalciferol (VITAMIN D3) 1000 UNITS CAPS Take 1,000 Units by mouth daily.   . hydrochlorothiazide (HYDRODIURIL) 25 MG tablet Take 12.5 mg by mouth daily.   Marland Kitchen lisinopril (PRINIVIL,ZESTRIL) 20 MG tablet Take 20 mg by mouth daily.  . metoprolol succinate (TOPROL-XL) 25 MG 24 hr tablet Take 12.5 mg by mouth daily.   Marland Kitchen omeprazole (PRILOSEC) 40 MG capsule Take 40 mg by mouth daily.  Marland Kitchen oxybutynin (DITROPAN-XL) 10 MG 24 hr tablet Take 10 mg by mouth daily.   . pramipexole (MIRAPEX) 1 MG tablet Take 1 mg by mouth at bedtime.  Marland Kitchen testosterone cypionate (DEPOTESTOTERONE CYPIONATE) 200 MG/ML injection Inject 300 mg into the  muscle every 28 (twenty-eight) days.  . [DISCONTINUED] HYDROcodone-acetaminophen (NORCO/VICODIN) 5-325 MG per tablet Take 1-2 tablets by mouth every 4 (four) hours as needed (breakthrough pain).  . [DISCONTINUED] methocarbamol (ROBAXIN) 500 MG tablet Take 1 tablet (500 mg total) by mouth every 6 (six) hours as needed for muscle spasms.    Review of Systems  Constitutional: Negative.   HENT: Negative.   Eyes: Negative.   Respiratory: Negative.   Cardiovascular: Negative.   Gastrointestinal: Negative.   Endocrine: Negative.   Genitourinary: Negative.   Musculoskeletal: Negative.        Knee replaced - doing well  Skin: Negative.   Allergic/Immunologic: Negative.   Neurological: Negative.   Hematological: Negative.   Psychiatric/Behavioral: Negative.        Objective:   Physical Exam  Nursing note and vitals reviewed. Constitutional: He is oriented to person, place, and time. He appears well-developed and well-nourished.  Alert cooperative and doing well following his knee replacement  HENT:  Head: Normocephalic and atraumatic.  Right Ear: External ear normal.  Left Ear: External ear normal.  Nose: Nose normal.  Mouth/Throat: Oropharynx is clear and moist. No oropharyngeal exudate.  Eyes: Conjunctivae and EOM are normal. Pupils are equal, round, and reactive to light. Right eye exhibits no discharge. Left eye exhibits no discharge. No scleral icterus.  Neck: Normal range of motion. Neck supple. No thyromegaly present.  Cardiovascular: Normal rate, regular rhythm, normal heart sounds and intact distal pulses.  Exam reveals no gallop and no friction rub.   No murmur heard. At 72 per minute. midline CABG scar  Pulmonary/Chest: Effort normal and breath sounds normal. No respiratory distress. He has no wheezes. He has no rales. He exhibits no tenderness.  Minimal basilar congestion posteriorly bilaterally   Abdominal: Soft. Bowel sounds are normal. He exhibits no mass. There is no  tenderness. There is no rebound and no guarding.  Genitourinary: Rectum normal and penis normal.   There were no rectal masses. The prostate vault was empty. There were no inguinal hernias bilaterally   Musculoskeletal: Normal range of motion. He exhibits no edema and no tenderness.  The right knee he has recently been replaced and appears to be healing well  Lymphadenopathy:    He has no cervical adenopathy.  Neurological: He is alert and oriented to person, place, and time. He has normal reflexes. No cranial nerve deficit.  Skin: Skin is warm and dry. No rash noted. No erythema. No pallor.  Psychiatric: He has a normal mood and affect. His behavior is normal. Judgment and thought content normal.   BP 171/75  Pulse 52  Temp(Src) 97.1 F (36.2 C) (Oral)  Ht '5\' 10"'  (1.778 m)  Wt 161 lb (73.029 kg)  BMI 23.10 kg/m2  Repeat blood pressure 164/84 right arm--- the patient indicates that his blood pressures by nurses at home have been excellent.      Assessment & Plan:  1. Hyperlipidemia - POCT CBC - NMR, lipoprofile  2. Prostate cancer - POCT CBC - PSA, total and free - POCT UA - Microscopic Only - POCT UA - Microalbumin  3. Testosterone deficiency - POCT CBC - PSA, total and free - POCT UA - Microscopic Only - POCT UA - Microalbumin  4. Essential hypertension - POCT CBC - BMP8+EGFR - Hepatic function panel  5. Vitamin D deficiency - Vit D  25 hydroxy (rtn osteoporosis monitoring)  6. Unspecified hypothyroidism - Thyroid Panel With TSH  7. Status post right knee replacement  No orders of the defined types were placed in this encounter.   Patient Instructions                       Medicare Annual Wellness Visit  [facility] and the medical providers at [facility] strive to bring you the best medical care.  In doing so we not only want to address your current medical conditions and concerns but also to detect new conditions early and prevent  illness, disease and health-related problems.    Medicare offers a yearly Wellness Visit which allows our clinical staff to assess your need for preventative services including immunizations, lifestyle education, counseling to decrease risk of preventable diseases and screening for fall risk and other medical concerns.    This visit is provided free of charge (no copay) for all Medicare recipients. The clinical pharmacists at [facility] have begun to conduct these Wellness Visits which will also include a thorough review of all your medications.    As you primary medical provider recommend that you make an appointment for your Annual Wellness Visit if you have not done so already this year.  You may set up this appointment before you leave today or you may call back ([phone]) and schedule an appointment.  Please make sure when you call that you mention that you are scheduling your Annual  Wellness Visit with the clinical pharmacist so that the appointment may be made for the proper length of time.     Continue current medications. Continue good therapeutic lifestyle changes which include good diet and exercise. Fall precautions discussed with patient. If an FOBT was given today- please return it to our front desk. If you are over 19 years old - you may need Prevnar 71 or the adult Pneumonia vaccine.  Flu Shots will be available at our office starting mid- September. Please call and schedule a FLU CLINIC APPOINTMENT.   For now, we will continue the testosterone injections. At the beginning of the new year we will consider tapering off of the testosterone injections and if you feel okay and are not having increased fatigue we will discontinue them. Call you with the results of today's lab work is as those results are unavailable Continue followup with the orthopedic surgeon Continue followup with the cardiologist Come by the office in a couple weeks and bring home blood  pressure readings with you for a blood pressure check here   Arrie Senate MD

## 2014-04-13 NOTE — Patient Instructions (Addendum)
Medicare Annual Wellness Visit  [facility] and the medical providers at [facility] strive to bring you the best medical care.  In doing so we not only want to address your current medical conditions and concerns but also to detect new conditions early and prevent illness, disease and health-related problems.    Medicare offers a yearly Wellness Visit which allows our clinical staff to assess your need for preventative services including immunizations, lifestyle education, counseling to decrease risk of preventable diseases and screening for fall risk and other medical concerns.    This visit is provided free of charge (no copay) for all Medicare recipients. The clinical pharmacists at [facility] have begun to conduct these Wellness Visits which will also include a thorough review of all your medications.    As you primary medical provider recommend that you make an appointment for your Annual Wellness Visit if you have not done so already this year.  You may set up this appointment before you leave today or you may call back ([phone]) and schedule an appointment.  Please make sure when you call that you mention that you are scheduling your Annual Wellness Visit with the clinical pharmacist so that the appointment may be made for the proper length of time.     Continue current medications. Continue good therapeutic lifestyle changes which include good diet and exercise. Fall precautions discussed with patient. If an FOBT was given today- please return it to our front desk. If you are over 35 years old - you may need Prevnar 40 or the adult Pneumonia vaccine.  Flu Shots will be available at our office starting mid- September. Please call and schedule a FLU CLINIC APPOINTMENT.   For now, we will continue the testosterone injections. At the beginning of the new year we will consider tapering off of the testosterone injections and  if you feel okay and are not having increased fatigue we will discontinue them. Call you with the results of today's lab work is as those results are unavailable Continue followup with the orthopedic surgeon Continue followup with the cardiologist Come by the office in a couple weeks and bring home blood pressure readings with you for a blood pressure check here

## 2014-04-14 LAB — THYROID PANEL WITH TSH
FREE THYROXINE INDEX: 2.1 (ref 1.2–4.9)
T3 Uptake Ratio: 36 % (ref 24–39)
T4 TOTAL: 5.9 ug/dL (ref 4.5–12.0)
TSH: 7.5 u[IU]/mL — ABNORMAL HIGH (ref 0.450–4.500)

## 2014-04-14 LAB — BMP8+EGFR
BUN / CREAT RATIO: 10 (ref 10–22)
BUN: 13 mg/dL (ref 8–27)
CO2: 26 mmol/L (ref 18–29)
CREATININE: 1.26 mg/dL (ref 0.76–1.27)
Calcium: 9.8 mg/dL (ref 8.6–10.2)
Chloride: 96 mmol/L — ABNORMAL LOW (ref 97–108)
GFR calc Af Amer: 60 mL/min/{1.73_m2} (ref >59)
GFR, EST NON AFRICAN AMERICAN: 52 mL/min/{1.73_m2} — AB (ref >59)
Glucose: 77 mg/dL (ref 65–99)
POTASSIUM: 4.6 mmol/L (ref 3.5–5.2)
SODIUM: 137 mmol/L (ref 134–144)

## 2014-04-14 LAB — HEPATIC FUNCTION PANEL
ALT: 9 IU/L (ref 0–44)
AST: 18 IU/L (ref 0–40)
Albumin: 4.1 g/dL (ref 3.5–4.7)
Alkaline Phosphatase: 105 IU/L (ref 39–117)
BILIRUBIN DIRECT: 0.21 mg/dL (ref 0.00–0.40)
BILIRUBIN TOTAL: 0.8 mg/dL (ref 0.0–1.2)
Total Protein: 6.8 g/dL (ref 6.0–8.5)

## 2014-04-14 LAB — NMR, LIPOPROFILE
Cholesterol: 164 mg/dL (ref 100–199)
HDL Cholesterol by NMR: 37 mg/dL — ABNORMAL LOW (ref >39)
HDL Particle Number: 21.2 umol/L — ABNORMAL LOW (ref >=30.5)
LDL Particle Number: 1299 nmol/L — ABNORMAL HIGH (ref <1000)
LDL SIZE: 20 nm (ref >20.5)
LDLC SERPL CALC-MCNC: 89 mg/dL (ref 0–99)
LP-IR SCORE: 30 (ref <=45)
SMALL LDL PARTICLE NUMBER: 851 nmol/L — AB (ref <=527)
TRIGLYCERIDES BY NMR: 189 mg/dL — AB (ref 0–149)

## 2014-04-14 LAB — VITAMIN D 25 HYDROXY (VIT D DEFICIENCY, FRACTURES): VIT D 25 HYDROXY: 37.1 ng/mL (ref 30.0–100.0)

## 2014-04-14 LAB — PSA, TOTAL AND FREE: PSA FREE: 0.01 ng/mL

## 2014-04-15 ENCOUNTER — Other Ambulatory Visit: Payer: Self-pay | Admitting: *Deleted

## 2014-04-15 MED ORDER — LEVOTHYROXINE SODIUM 25 MCG PO TABS: 25.0000 ug | ORAL_TABLET | Freq: Every day | ORAL | Status: DC

## 2014-04-20 ENCOUNTER — Ambulatory Visit (INDEPENDENT_AMBULATORY_CARE_PROVIDER_SITE_OTHER): Payer: Medicare PPO | Admitting: *Deleted

## 2014-04-20 DIAGNOSIS — E291 Testicular hypofunction: Secondary | ICD-10-CM

## 2014-05-10 ENCOUNTER — Other Ambulatory Visit (INDEPENDENT_AMBULATORY_CARE_PROVIDER_SITE_OTHER): Payer: Medicare PPO

## 2014-05-10 ENCOUNTER — Other Ambulatory Visit: Payer: Self-pay | Admitting: *Deleted

## 2014-05-10 DIAGNOSIS — R7989 Other specified abnormal findings of blood chemistry: Secondary | ICD-10-CM

## 2014-05-10 MED ORDER — OXYBUTYNIN CHLORIDE ER 5 MG PO TB24: 10.0000 mg | ORAL_TABLET | Freq: Two times a day (BID) | ORAL | Status: DC

## 2014-05-11 LAB — THYROID PANEL WITH TSH
Free Thyroxine Index: 1.6 (ref 1.2–4.9)
T3 Uptake Ratio: 34 % (ref 24–39)
T4, Total: 4.7 ug/dL (ref 4.5–12.0)
TSH: 4.88 u[IU]/mL — AB (ref 0.450–4.500)

## 2014-05-20 ENCOUNTER — Ambulatory Visit (INDEPENDENT_AMBULATORY_CARE_PROVIDER_SITE_OTHER): Payer: Medicare PPO | Admitting: *Deleted

## 2014-05-20 DIAGNOSIS — E291 Testicular hypofunction: Secondary | ICD-10-CM

## 2014-05-20 DIAGNOSIS — E349 Endocrine disorder, unspecified: Secondary | ICD-10-CM

## 2014-05-20 DIAGNOSIS — Z23 Encounter for immunization: Secondary | ICD-10-CM

## 2014-05-20 NOTE — Patient Instructions (Signed)

## 2014-05-20 NOTE — Progress Notes (Signed)
Patient ID: [name], male   DOB: Jul 09, 1927, 80 y.o.   MRN: [id number] Testosterone given and tolerated well

## 2014-06-10 ENCOUNTER — Ambulatory Visit (INDEPENDENT_AMBULATORY_CARE_PROVIDER_SITE_OTHER): Payer: Medicare PPO | Admitting: Nurse Practitioner

## 2014-06-10 ENCOUNTER — Other Ambulatory Visit: Payer: Self-pay | Admitting: Family Medicine

## 2014-06-10 VITALS — BP 185/77 | HR 58 | Temp 98.7°F | Ht 70.0 in | Wt 163.0 lb

## 2014-06-10 DIAGNOSIS — R079 Chest pain, unspecified: Secondary | ICD-10-CM | POA: Insufficient documentation

## 2014-06-10 NOTE — Progress Notes (Signed)
   Subjective:    Patient ID: [name], male    DOB: November 13, 1926, 80 y.o.   MRN: [id number]  HPI Patient is here today complaining of heart problems. He reports feeling like his heart was "beating to hard". He reports not feeling well for two days. He reports dizziness, denies sweating and vision changes, denies any chest tightness.    Review of Systems  Cardiovascular:       Bradycardia.   All other systems reviewed and are negative.      Objective:   Physical Exam  Constitutional: He is oriented to person, place, and time. He appears well-developed and well-nourished.  HENT:  Head: Normocephalic.  Eyes: Pupils are equal, round, and reactive to light.  Neck: Normal range of motion.  Cardiovascular: Normal rate.   Murmur (3/6 systolic) heard. Pulmonary/Chest: Effort normal.  Abdominal: Soft.  Neurological: He is alert and oriented to person, place, and time.  Skin: Skin is warm.  Psychiatric: He has a normal mood and affect.   BP 185/77  Pulse 58  Temp(Src) 98.7 F (37.1 C) (Oral)  Ht 5\' 10"  (1.778 m)  Wt 163 lb (73.936 kg)  BMI 23.39 kg/m2  EKG. Sinus brady-Scott Hassell Done, FNP       Assessment & Plan:  1. Chest pain, unspecified chest pain type  - EKG 12-Lead  Information about sign and symptoms of chest pain given to patient Pt instructed to return to office if sign and symptoms persist  RTO prn  Scott Hassell Done, FNP

## 2014-06-10 NOTE — Patient Instructions (Signed)

## 2014-06-11 ENCOUNTER — Telehealth: Payer: Self-pay | Admitting: Cardiology

## 2014-06-11 NOTE — Telephone Encounter (Signed)
New message    Patient calling C/O heart beating hard. Went to PCP on yesterday in [place].

## 2014-06-11 NOTE — Telephone Encounter (Signed)
Spoke with pt who saw Stanton [facility]- [facility] yesterday.  BP 185/77 HR 58 - he is on Metoprolol 25 mg 1/2 tablet daily.  He c/o yesterday of heart beating hard.  No changes were made at that time.  He was instructed to call her back if he continued to have problems.  He has not done this.  He reports that "it feels a little better now."  No other complaints.  He will call back if s/s worsen.  Advised to continue current medications.  He states understanding.

## 2014-06-12 ENCOUNTER — Ambulatory Visit (INDEPENDENT_AMBULATORY_CARE_PROVIDER_SITE_OTHER): Payer: Medicare PPO | Admitting: General Practice

## 2014-06-12 ENCOUNTER — Emergency Department (HOSPITAL_COMMUNITY)
Admission: EM | Admit: 2014-06-12 | Discharge: 2014-06-12 | Disposition: A | Discharge status: Home / Self Care | Payer: Medicare PPO | Attending: Emergency Medicine | Admitting: Emergency Medicine

## 2014-06-12 ENCOUNTER — Encounter (HOSPITAL_COMMUNITY): Payer: Self-pay | Admitting: Emergency Medicine

## 2014-06-12 ENCOUNTER — Emergency Department (HOSPITAL_COMMUNITY): Payer: Medicare PPO

## 2014-06-12 VITALS — BP 155/112 | HR 77 | Temp 96.7°F | Ht 70.0 in | Wt 161.8 lb

## 2014-06-12 DIAGNOSIS — Z951 Presence of aortocoronary bypass graft: Secondary | ICD-10-CM | POA: Insufficient documentation

## 2014-06-12 DIAGNOSIS — M1990 Unspecified osteoarthritis, unspecified site: Secondary | ICD-10-CM | POA: Diagnosis not present

## 2014-06-12 DIAGNOSIS — Z8639 Personal history of other endocrine, nutritional and metabolic disease: Secondary | ICD-10-CM | POA: Diagnosis not present

## 2014-06-12 DIAGNOSIS — Z7982 Long term (current) use of aspirin: Secondary | ICD-10-CM | POA: Insufficient documentation

## 2014-06-12 DIAGNOSIS — Z8546 Personal history of malignant neoplasm of prostate: Secondary | ICD-10-CM | POA: Diagnosis not present

## 2014-06-12 DIAGNOSIS — I10 Essential (primary) hypertension: Secondary | ICD-10-CM | POA: Diagnosis not present

## 2014-06-12 DIAGNOSIS — R000 Tachycardia, unspecified: Secondary | ICD-10-CM

## 2014-06-12 DIAGNOSIS — I2510 Atherosclerotic heart disease of native coronary artery without angina pectoris: Secondary | ICD-10-CM | POA: Diagnosis not present

## 2014-06-12 DIAGNOSIS — I4891 Unspecified atrial fibrillation: Secondary | ICD-10-CM

## 2014-06-12 DIAGNOSIS — K219 Gastro-esophageal reflux disease without esophagitis: Secondary | ICD-10-CM | POA: Insufficient documentation

## 2014-06-12 DIAGNOSIS — Z79899 Other long term (current) drug therapy: Secondary | ICD-10-CM | POA: Insufficient documentation

## 2014-06-12 DIAGNOSIS — Z87891 Personal history of nicotine dependence: Secondary | ICD-10-CM | POA: Diagnosis not present

## 2014-06-12 DIAGNOSIS — Z8701 Personal history of pneumonia (recurrent): Secondary | ICD-10-CM | POA: Insufficient documentation

## 2014-06-12 DIAGNOSIS — I4892 Unspecified atrial flutter: Secondary | ICD-10-CM | POA: Diagnosis not present

## 2014-06-12 LAB — CBC
HCT: 44.4 % (ref 39.0–52.0)
Hemoglobin: 14.7 g/dL (ref 13.0–17.0)
MCH: 29.4 pg (ref 26.0–34.0)
MCHC: 33.1 g/dL (ref 30.0–36.0)
MCV: 88.8 fL (ref 78.0–100.0)
PLATELETS: 346 10*3/uL (ref 150–400)
RBC: 5 MIL/uL (ref 4.22–5.81)
RDW: 14.1 % (ref 11.5–15.5)
WBC: 8.9 10*3/uL (ref 4.0–10.5)

## 2014-06-12 LAB — BASIC METABOLIC PANEL
ANION GAP: 10 (ref 5–15)
BUN: 15 mg/dL (ref 6–23)
CHLORIDE: 97 meq/L (ref 96–112)
CO2: 29 mEq/L (ref 19–32)
Calcium: 10 mg/dL (ref 8.4–10.5)
Creatinine, Ser: 1.28 mg/dL (ref 0.50–1.35)
GFR calc Af Amer: 57 mL/min — ABNORMAL LOW (ref >90)
GFR, EST NON AFRICAN AMERICAN: 49 mL/min — AB (ref >90)
Glucose, Bld: 98 mg/dL (ref 70–99)
POTASSIUM: 4.4 meq/L (ref 3.7–5.3)
Sodium: 136 mEq/L — ABNORMAL LOW (ref 137–147)

## 2014-06-12 LAB — I-STAT TROPONIN, ED: TROPONIN I, POC: 0.08 ng/mL (ref 0.00–0.08)

## 2014-06-12 LAB — PROTIME-INR
INR: 1.13 (ref 0.00–1.49)
PROTHROMBIN TIME: 14.7 s (ref 11.6–15.2)

## 2014-06-12 LAB — APTT: aPTT: 38 seconds — ABNORMAL HIGH (ref 24–37)

## 2014-06-12 MED ORDER — SODIUM CHLORIDE 0.9 % IV SOLN
1000.0000 mL | INTRAVENOUS | Status: DC
  Administered 2014-06-12: 1000 mL via INTRAVENOUS

## 2014-06-12 MED ORDER — DILTIAZEM HCL 25 MG/5ML IV SOLN: 15.0000 mg | Freq: Once | INTRAVENOUS | Status: DC

## 2014-06-12 MED ORDER — METOPROLOL SUCCINATE ER 25 MG PO TB24: 25.0000 mg | ORAL_TABLET | Freq: Every day | ORAL | Status: DC

## 2014-06-12 MED ORDER — APIXABAN 2.5 MG PO TABS: 2.5000 mg | ORAL_TABLET | Freq: Two times a day (BID) | ORAL | Status: DC

## 2014-06-12 NOTE — Discharge Instructions (Signed)
Atrial Flutter °Atrial flutter is a heart rhythm that can cause the heart to beat very fast (tachycardia). It originates in the upper chambers of the heart (atria). In atrial flutter, the top chambers of the heart (atria) often beat much faster than the bottom chambers of the heart (ventricles). Atrial flutter has a regular "saw toothed" appearance in an EKG readout. An EKG is a test that records the electrical activity of the heart. Atrial flutter can cause the heart to beat up to 150 beats per minute (BPM). Atrial flutter can either be short lived (paroxysmal) or permanent.  °CAUSES  °Causes of atrial flutter can be many. Some of these include: °· Heart related issues: °¨ Heart attack (myocardial infarction). °¨ Heart failure. °¨ Heart valve problems. °¨ Poorly controlled high blood pressure (hypertension). °¨ After open heart surgery. °· Lung related issues: °¨ A blood clot in the lungs (pulmonary embolism). °¨ Chronic obstructive pulmonary disease (COPD). Medications used to treat COPD can attribute to atrial flutter. °· Other related causes: °¨ Hyperthyroidism. °¨ Caffeine. °¨ Some decongestant cold medications. °¨ Low electrolyte levels such as potassium or magnesium. °¨ Cocaine. °SYMPTOMS °· An awareness of your heart beating rapidly (palpitations). °· Shortness of breath. °· Chest pain. °· Low blood pressure (hypotension). °· Dizziness or fainting. °DIAGNOSIS  °Different tests can be performed to diagnose atrial flutter.  °· An EKG. °· Holter monitor. This is a 24-hour recording of your heart rhythm. You will also be given a diary. Write down all symptoms that you have and what you were doing at the time you experienced symptoms. °· Cardiac event monitor. This small device can be worn for up to 30 days. When you have heart symptoms, you will push a button on the device. This will then record your heart rhythm. °· Echocardiogram. This is an imaging test to look at your heart. Your caregiver will look at your  heart valves and the ventricles. °· Stress test. This test can help determine if the atrial flutter is related to exercise or if coronary artery disease is present. °· Laboratory studies will look at certain blood levels like: °¨ Complete blood count (CBC). °¨ Potassium. °¨ Magnesium. °¨ Thyroid function. °TREATMENT  °Treatment of atrial flutter varies. A combination of therapies may be used or sometimes atrial flutter may need only 1 type of treatment.  °Lab work: °If your blood work, such as your electrolytes (potassium, magnesium) or your thyroid function tests, are abnormal, your caregiver will treat them accordingly.  °Medication:  °There are several different types of medications that can convert your heart to a normal rhythm and prevent atrial flutter from reoccurring.  °Nonsurgical procedures: °Nonsurgical techniques may be used to control atrial flutter. Some examples include: °· Cardioversion. This technique uses either drugs or an electrical shock to restore a normal heart rhythm: °¨ Cardioversion drugs may be given through an intravenous (IV) line to help "reset" the heart rhythm. °¨ In electrical cardioversion, your caregiver shocks your heart with electrical energy. This helps to reset the heartbeat to a normal rhythm. °· Ablation. If atrial flutter is a persistent problem, an ablation may be needed. This procedure is done under mild sedation. High frequency radio-wave energy is used to destroy the area of heart tissue responsible for atrial flutter. °SEEK IMMEDIATE MEDICAL CARE IF:  °You have: °· Dizziness. °· Near fainting or fainting. °· Shortness of breath. °· Chest pain or pressure. °· Sudden nausea or vomiting. °· Profuse sweating. °If you have the above symptoms,   call your local emergency service immediately! Do not drive yourself to the hospital. °MAKE SURE YOU:  °· Understand these instructions. °· Will watch your condition. °· Will get help right away if you are not doing well or get  worse. °Document Released: 12/23/2008 Document Revised: 12/21/2013 Document Reviewed: 12/23/2008 °ExitCare® Patient Information ©2015 [employer]. This information is not intended to replace advice given to you by your health care provider. Make sure you discuss any questions you have with your health care provider. ° °

## 2014-06-12 NOTE — ED Notes (Signed)
He reports an episode yesterday and today where he felt his heart was racing. He reports both times he felt his heart racing for about an hour. He was resting at onset. He states it does not feel like his heart is racing now. He denies pain. Hes A&Ox4, breathing easily

## 2014-06-12 NOTE — ED Notes (Signed)
Dr.Knapp at bedside  

## 2014-06-12 NOTE — Progress Notes (Signed)
   Subjective:    Patient ID: [name], male    DOB: 10-04-1926, 80 y.o.   MRN: [id number]  HPI Patient presents today with complaints of "heart racing" intermittently for past 2 days. Reports is seems to have gradually worsened. Reports medication compliance. Denies chest pain, shortness of breath, dizziness, or change in mental status. [name] was seen with similar symptoms on 06/10/14. He denies seeking emergency treatment for these symptoms.     Review of Systems  Constitutional: Negative for fever and chills.  Respiratory: Negative for chest tightness and shortness of breath.   Cardiovascular: Positive for palpitations. Negative for chest pain and leg swelling.  Gastrointestinal: Negative for abdominal pain.  Neurological: Negative for dizziness and headaches.       Objective:   Physical Exam  Constitutional: He is oriented to person, place, and time. He appears well-developed and well-nourished.  HENT:  Head: Normocephalic and atraumatic.  Cardiovascular: An irregularly irregular rhythm present.  Pulmonary/Chest: Effort normal and breath sounds normal. No respiratory distress. He exhibits no tenderness.  Abdominal: Soft. He exhibits no distension.  Neurological: He is alert and oriented to person, place, and time.  Skin: Skin is warm and dry.  Psychiatric: He has a normal mood and affect.    EKG, Atrial Fibrillation     Assessment & Plan:  1. Atrial fibrillation, unspecified  2. Rapid heart rate - EKG 12-Lead -Discussed atrial fibrillation with patient and transport via EMS to hospital facility. Patient refused to be transported via EMS, despite medical advice.  -Patient signed AMA and brother (who verbalized understanding risks) agrees to transport to [facility] (patient choice).  Scott Pian, FNP-C

## 2014-06-12 NOTE — Patient Instructions (Signed)
Rejection of Medical Treatment, Against Medical Advice Medical examination, treatment, or testing has been recommended for me. I have decided to reject further treatment or medical evaluation, and will leave the facility. I am rejecting medical care of my own choice, and contrary to the instructions and wishes of __________________________________________________, the treating physician. I understand that permanent harm, or even death, can occur from failing to follow the recommendations of the physician. I have had an opportunity to ask questions and fully understand my medical condition. I have been advised that I may return at any time to continue medical evaluation or treatment.  Because I am rejecting recommended medical care, I agree to absolve and release the physician and the medical facility from any and all liabilities for damages arising from any current medical condition. I accept all risk associated with my medical condition, both known and unknown. I assume all responsibility for this action. I agree that I will make no claim of any nature against this medical facility or the physician under any circumstances. __________________________________________________ Name of Patient __________________________________________________ Signature of Patient or Guardian of Same __________________________________________________ Date Document Released: 08/06/2005 Document Revised: 10/29/2011 Document Reviewed: 03/25/2008 ExitCare Patient Information 2015 [place], [place]. This information is not intended to replace advice given to you by your health care provider. Make sure you discuss any questions you have with your health care provider.

## 2014-06-12 NOTE — ED Provider Notes (Signed)
CSN: [id number]     Arrival date & time 06/12/14  1144 History   First MD Initiated Contact with Patient 06/12/14 1154     Chief Complaint  Patient presents with  . Tachycardia     HPI Patient presents to the emergency room with complaints of tachycardia and palpitations. Patient has a history of coronary artery disease status post bypass surgery in January. Over the last couple of days he's noticed intermittent episodes of palpitations with his heart racing. The episodes are generally brief and will resolve on their own. Today the episode has lasted longer maybe up to an hour. He has Not had any trouble with chest pain or shortness of breath. He denies any syncope. Past Medical History  Diagnosis Date  . Prostate cancer   . GERD (gastroesophageal reflux disease)   . Hypertension   . Hyperlipemia   . Arthritis   . Inguinal hernia     left  . Coronary artery disease   . Dysrhythmia   . Shortness of breath   . Pneumonia   . H/O hiatal hernia    Past Surgical History  Procedure Laterality Date  . Hernia repair    . Prostate surgery    . Colonoscopy    . Coronary artery bypass graft N/A 09/03/2013    Procedure: CORONARY ARTERY BYPASS GRAFTING times four using left internal mammary and bilateral saphenous vein.;  Surgeon: Ivin Poot, MD;  Location: [place];  Service: Open Heart Surgery;  Laterality: N/A;  . Intraoperative transesophageal echocardiogram N/A 09/03/2013    Procedure: INTRAOPERATIVE TRANSESOPHAGEAL ECHOCARDIOGRAM;  Surgeon: Ivin Poot, MD;  Location: [place];  Service: Open Heart Surgery;  Laterality: N/A;  . Coronary angioplasty    . Cataract extraction Bilateral   . Total knee arthroplasty Right 03/11/2014    Procedure: TOTAL KNEE ARTHROPLASTY;  Surgeon: Hessie Dibble, MD;  Location: [place];  Service: Orthopedics;  Laterality: Right;   Family History  Problem Relation Age of Onset  . Colon cancer Neg Hx   . Cancer Mother     bone marrow   History   Substance Use Topics  . Smoking status: Former Smoker -- 1.00 packs/day    Types: Cigarettes    Quit date: 08/20/1978  . Smokeless tobacco: Never Used  . Alcohol Use: No    Review of Systems  All other systems reviewed and are negative.     Allergies  Lipitor  Home Medications   Prior to Admission medications   Medication Sig Start Date End Date Taking? Authorizing Provider  aspirin EC 325 MG tablet Take 1 tablet (325 mg total) by mouth 2 (two) times daily at 8 am and 10 pm. 03/12/14   Rich Fuchs, PA-C  Cholecalciferol (VITAMIN D3) 1000 UNITS CAPS Take 1,000 Units by mouth daily.     Historical Provider, MD  hydrochlorothiazide (HYDRODIURIL) 25 MG tablet Take 12.5 mg by mouth daily.     Historical Provider, MD  levothyroxine (SYNTHROID, LEVOTHROID) 25 MCG tablet TAKE ONE TABLET BY MOUTH ONE TIME DAILY BEFORE BREAKFAST 06/11/14   Wardell Honour, MD  lisinopril (PRINIVIL,ZESTRIL) 20 MG tablet Take 20 mg by mouth daily.    Historical Provider, MD  metoprolol succinate (TOPROL-XL) 25 MG 24 hr tablet Take 12.5 mg by mouth daily.     Historical Provider, MD  omeprazole (PRILOSEC) 40 MG capsule Take 40 mg by mouth daily.    Historical Provider, MD  oxybutynin (DITROPAN-XL) 5 MG 24 hr tablet Take  2 tablets (10 mg total) by mouth 2 (two) times daily. 05/10/14   Chipper Herb, MD  pramipexole (MIRAPEX) 1 MG tablet Take 1 mg by mouth at bedtime.    Historical Provider, MD  testosterone cypionate (DEPOTESTOTERONE CYPIONATE) 200 MG/ML injection Inject 300 mg into the muscle every 28 (twenty-eight) days.    Historical Provider, MD   BP 135/77  Pulse 125  Temp(Src) 97.7 F (36.5 C) (Oral)  Resp 16  SpO2 98% Physical Exam  Nursing note and vitals reviewed. Constitutional: He appears well-developed and well-nourished. No distress.  HENT:  Head: Normocephalic and atraumatic.  Right Ear: External ear normal.  Left Ear: External ear normal.  Eyes: Conjunctivae are normal. Right eye  exhibits no discharge. Left eye exhibits no discharge. No scleral icterus.  Neck: Neck supple. No tracheal deviation present.  Cardiovascular: Intact distal pulses.  An irregularly irregular rhythm present. Tachycardia present.   Pulmonary/Chest: Effort normal and breath sounds normal. No stridor. No respiratory distress. He has no wheezes. He has no rales.  Abdominal: Soft. Bowel sounds are normal. He exhibits no distension. There is no tenderness. There is no rebound and no guarding.  Musculoskeletal: He exhibits no edema and no tenderness.  Neurological: He is alert. He has normal strength. No cranial nerve deficit (no facial droop, extraocular movements intact, no slurred speech) or sensory deficit. He exhibits normal muscle tone. He displays no seizure activity. Coordination normal.  Skin: Skin is warm and dry. No rash noted.  Psychiatric: He has a normal mood and affect.    ED Course  Procedures (including critical care time) Labs Review Labs Reviewed  BASIC METABOLIC PANEL - Abnormal; Notable for the following:    Sodium 136 (*)    GFR calc non Af Amer 49 (*)    GFR calc Af Amer 57 (*)    All other components within normal limits  APTT - Abnormal; Notable for the following:    aPTT 38 (*)    All other components within normal limits  CBC  PROTIME-INR  I-STAT TROPOININ, ED    Imaging Review Dg Chest Portable 1 View  06/12/2014   CLINICAL DATA:  Tachycardia.  Prior CABG.  EXAM: PORTABLE CHEST - 1 VIEW  COMPARISON:  12/01/2013 and 07/27/2013  FINDINGS: Stable cardiomegaly. Prior median sternotomy for CABG. Added density projecting over the heart in the midline chest appears similar to prior examinations is most compatible with the patient's known large hiatal hernia. Probable left nipple shadow noted. Pulmonary vascularity is normal. No airspace disease or pleural effusion. Negative for pneumothorax.  IMPRESSION: Stable cardiomegaly.  No acute findings in the chest.  Hiatal hernia.    Electronically Signed   By: Curlene Dolphin M.D.   On: 06/12/2014 13:27       EKG Interpretation Date/Time:  Saturday June 12 2014 11:48:42 EDT Ventricular Rate:  131 PR Interval:  146 QRS Duration: 90 QT Interval:  290 QTC Calculation: 428 R Axis:   -46 Text Interpretation:  undetermined rhtythm, sinus tach vs a flutter Left  axis deviation Pulmonary disease pattern Abnormal ECG Confirmed by Wilver Tignor   MD-J, Jarquavious Fentress ([id number]) on 06/12/2014 12:20:51 PM      EKG Interpretation  Date/Time:  Saturday June 12 2014 12:13:42 EDT Ventricular Rate:  59 PR Interval:  199 QRS Duration: 84 QT Interval:  410 QTC Calculation: 406 R Axis:   -32 Text Interpretation:  Sinus rhythm Probable left ventricular hypertrophy tachycardia resolved since last tracing Confirmed by Jamell Laymon  MD-J,  Kauan Kloosterman [phone]) on 06/12/2014 12:21:39 PM      Patient was monitored in the emergency department for several hours. He had no further episodes of tachycardia.  MDM   Final diagnoses:  Atrial flutter with rapid ventricular response   Patient's EKG was consistent with atrial flutter versus atrial fibrillation. He spontaneously converted back to a sinus rhythm in the emergency department. I discussed the case with Dr. Wynonia Lawman. The patient's chads 2 vasc score indicates that anticoagulation is indicated.  Patient will be started on a course 2.5 mg twice daily. He'll discontinue his aspirin. We will increase his metoprolol to 25 mg daily. Patient will follow up with his cardiologist later this week.  Findings and plan were discussed with the patient and  he is comfortable with going home with outpatient follow-up    Dorie Rank, MD 06/12/14 1527

## 2014-06-16 ENCOUNTER — Ambulatory Visit (INDEPENDENT_AMBULATORY_CARE_PROVIDER_SITE_OTHER): Payer: Medicare PPO | Admitting: Cardiology

## 2014-06-16 ENCOUNTER — Encounter: Payer: Self-pay | Admitting: Cardiology

## 2014-06-16 VITALS — BP 140/80 | HR 104 | Ht 71.0 in | Wt 163.0 lb

## 2014-06-16 DIAGNOSIS — R002 Palpitations: Secondary | ICD-10-CM

## 2014-06-16 DIAGNOSIS — I4892 Unspecified atrial flutter: Secondary | ICD-10-CM

## 2014-06-16 MED ORDER — APIXABAN 5 MG PO TABS
5.0000 mg | ORAL_TABLET | Freq: Two times a day (BID) | ORAL | Status: DC
Start: 2014-06-16 — End: 2014-06-28

## 2014-06-16 MED ORDER — METOPROLOL SUCCINATE ER 50 MG PO TB24: 50.0000 mg | ORAL_TABLET | Freq: Every day | ORAL | Status: DC

## 2014-06-16 NOTE — Progress Notes (Signed)
HPI The patient presents as a new patient for me.  He is s/p CABG earlier this year.  He had been doing well.  However, on Thursday he noticed a rapid heart rate. He went to Dr. Tawanna Sat office was in sinus. On Saturday he had a recurrence of this rhythm and was noted to be in atrial flutter. He went to the emergency room. He converted to sinus rhythm spontaneously. He was started on file is less. He thinks he's had some palpitations since then but not as sustained. He was told to take a higher dose of beta blocker. He's felt a little fatigued and slightly short of breath but this is not different than previous since bypass.  He has not had any chest pressure, neck or arm discomfort. He's not had any PND or orthopnea. He's had no weight gain or edema.  I reviewed ER records, labs, office records from last week and all the labs.  Allergies  Allergen Reactions  . Lipitor [Atorvastatin] Other (See Comments)    "feel bad"    Current Outpatient Prescriptions  Medication Sig Dispense Refill  . apixaban (ELIQUIS) 2.5 MG TABS tablet Take 1 tablet (2.5 mg total) by mouth 2 (two) times daily.  60 tablet  0  . Cholecalciferol (VITAMIN D3) 1000 UNITS CAPS Take 1,000 Units by mouth daily.       . hydrochlorothiazide (HYDRODIURIL) 25 MG tablet Take 12.5 mg by mouth daily.       Marland Kitchen levothyroxine (SYNTHROID, LEVOTHROID) 25 MCG tablet TAKE ONE TABLET BY MOUTH ONE TIME DAILY BEFORE BREAKFAST  30 tablet  2  . lisinopril (PRINIVIL,ZESTRIL) 20 MG tablet Take 20 mg by mouth daily.      . metoprolol succinate (TOPROL-XL) 25 MG 24 hr tablet Take 1 tablet (25 mg total) by mouth daily.  30 tablet  0  . omeprazole (PRILOSEC) 40 MG capsule Take 40 mg by mouth daily.      . pramipexole (MIRAPEX) 1 MG tablet Take 1 mg by mouth at bedtime.      Marland Kitchen testosterone cypionate (DEPOTESTOTERONE CYPIONATE) 200 MG/ML injection Inject 300 mg into the muscle every 28 (twenty-eight) days.      Marland Kitchen oxybutynin (DITROPAN) 5 MG tablet Take 5  mg by mouth daily.       Current Facility-Administered Medications  Medication Dose Route Frequency Provider Last Rate Last Dose  . testosterone cypionate (DEPOTESTOTERONE CYPIONATE) injection 300 mg  300 mg Intramuscular Q28 days Chipper Herb, MD   300 mg at 05/20/14 1017    Past Medical History  Diagnosis Date  . Prostate cancer   . GERD (gastroesophageal reflux disease)   . Hypertension   . Hyperlipemia   . Arthritis   . Inguinal hernia     left  . Coronary artery disease   . Dysrhythmia   . Shortness of breath   . Pneumonia   . H/O hiatal hernia     Past Surgical History  Procedure Laterality Date  . Hernia repair    . Prostate surgery    . Colonoscopy    . Coronary artery bypass graft N/A 09/03/2013    Procedure: CORONARY ARTERY BYPASS GRAFTING times four using left internal mammary and bilateral saphenous vein.;  Surgeon: Ivin Poot, MD;  Location: [place];  Service: Open Heart Surgery;  Laterality: N/A;  . Intraoperative transesophageal echocardiogram N/A 09/03/2013    Procedure: INTRAOPERATIVE TRANSESOPHAGEAL ECHOCARDIOGRAM;  Surgeon: Ivin Poot, MD;  Location: [place];  Service:  Open Heart Surgery;  Laterality: N/A;  . Coronary angioplasty    . Cataract extraction Bilateral   . Total knee arthroplasty Right 03/11/2014    Procedure: TOTAL KNEE ARTHROPLASTY;  Surgeon: Hessie Dibble, MD;  Location: [place];  Service: Orthopedics;  Laterality: Right;    ROS:  As stated in the HPI and negative for all other systems.  PHYSICAL EXAM BP 140/80  Pulse 104  Ht 5\' 11"  (1.803 m)  Wt 163 lb (73.936 kg)  BMI 22.74 kg/m2 GENERAL:  Well appearing HEENT:  Pupils equal round and reactive, fundi not visualized, oral mucosa unremarkable NECK:  No jugular venous distention, waveform within normal limits, carotid upstroke brisk and symmetric, no bruits, no thyromegaly LYMPHATICS:  No cervical, inguinal adenopathy LUNGS:  Clear to auscultation bilaterally BACK:  No CVA  tenderness CHEST: Well healed sternotomy scar. HEART:  PMI not displaced or sustained,S1 and S2 within normal limits, no S3, no S4, no clicks, no rubs, no murmurs ABD:  Flat, positive bowel sounds normal in frequency in pitch, no bruits, no rebound, no guarding, no midline pulsatile mass, no hepatomegaly, no splenomegaly EXT:  2 plus pulses throughout, no edema, no cyanosis no clubbing SKIN:  No rashes no nodules NEURO:  Cranial nerves II through XII grossly intact, motor grossly intact throughout PSYCH:  Cognitively intact, oriented to person place and time  EKG:  Sinus rhythm, rate 92, left axis deviation, no acute ST-T wave changes. 06/16/2014  ASSESSMENT AND PLAN  ATRIAL FLUTTER:  The patient should be on 5 mg twice a day of Eliquis and he will increase this. He weighs greater than 60 kg and his creatinine is 1.28.  His creatinine clearance is 44. I will increase his beta blocker to 50 mg twice a day.   I will see him back in about a month.  Between now and then I will put him on an event monitor to see if he's having recurrent flutter although I suspect this. I will have a low threshold to send him for ablation consideration.  CAD:   The patient has no new sypmtoms.  No further cardiovascular testing is indicated.  We will continue with aggressive risk reduction and meds as listed.  HTN:  His BP is at target.  I will make changes as above.

## 2014-06-16 NOTE — Patient Instructions (Signed)
Please increase your Metoprolol to 50 mg a day. Increase you Eliquis to 5 mg twice a day. Continue all other medications as listed.  Your physician has recommended that you wear an event monitor for 21 days. Event monitors are medical devices that record the heart's electrical activity. Doctors most often [place] these monitors to diagnose arrhythmias. Arrhythmias are problems with the speed or rhythm of the heartbeat. The monitor is a small, portable device. You can wear one while you do your normal daily activities. This is usually used to diagnose what is causing palpitations/syncope (passing out).  Follow up with Dr Percival Spanish in 6 to 8 weeks in [place].

## 2014-06-22 ENCOUNTER — Ambulatory Visit: Payer: Medicare PPO | Admitting: *Deleted

## 2014-06-22 ENCOUNTER — Other Ambulatory Visit (INDEPENDENT_AMBULATORY_CARE_PROVIDER_SITE_OTHER): Payer: Medicare PPO

## 2014-06-22 DIAGNOSIS — E349 Endocrine disorder, unspecified: Secondary | ICD-10-CM

## 2014-06-22 DIAGNOSIS — R799 Abnormal finding of blood chemistry, unspecified: Secondary | ICD-10-CM

## 2014-06-22 DIAGNOSIS — E291 Testicular hypofunction: Secondary | ICD-10-CM

## 2014-06-22 NOTE — Progress Notes (Signed)
Lab only 

## 2014-06-23 ENCOUNTER — Ambulatory Visit: Payer: Medicare PPO | Admitting: Cardiology

## 2014-06-23 LAB — THYROID PANEL WITH TSH
FREE THYROXINE INDEX: 2.2 (ref 1.2–4.9)
T3 UPTAKE RATIO: 34 % (ref 24–39)
T4, Total: 6.4 ug/dL (ref 4.5–12.0)
TSH: 3.82 u[IU]/mL (ref 0.450–4.500)

## 2014-06-24 ENCOUNTER — Telehealth: Payer: Self-pay | Admitting: *Deleted

## 2014-06-24 NOTE — Telephone Encounter (Signed)
-----   Message from Chipper Herb, MD sent at 06/23/2014  6:57 PM EST ----- All thyroid function tests are within normal limits----continue current treatment with thyroid medication and we will continue to monitor this in the future

## 2014-06-24 NOTE — Telephone Encounter (Signed)
Patient called Dr.Hochrein received monitor strips from [employer] which revealed intermittent atrial flutter.Advised to continue current medications.Advised to keep appointment with him 07/28/14 at 1:00 pm.

## 2014-06-28 ENCOUNTER — Telehealth: Payer: Self-pay | Admitting: Cardiology

## 2014-06-28 DIAGNOSIS — R002 Palpitations: Secondary | ICD-10-CM

## 2014-06-28 DIAGNOSIS — I4892 Unspecified atrial flutter: Secondary | ICD-10-CM

## 2014-06-28 MED ORDER — APIXABAN 5 MG PO TABS: 5.0000 mg | ORAL_TABLET | Freq: Two times a day (BID) | ORAL | Status: DC

## 2014-06-28 NOTE — Telephone Encounter (Signed)
New message  Pt called... Two scripts were written mailed them to [facility].[name] states that they havent heard from the Dr. Loni Muse fax was sent but no one has followed up. Pt says he will need a refill of Eliquis and metoprolol by Wednesday 06/30/2014. Please call back to discuss//sr

## 2014-06-28 NOTE — Telephone Encounter (Signed)
Spoke with pt. He has mailed in prescription for Eliquis and Metoprolol. States [employer] order has questions they need answered. [employer] number is [phone]. Spoke with [employer] and question was regarding dosage of metoprolol. Chart reviewed and pt was given prescription for Toprol 50 mg daily. Tech confirms they have received this prescription.  I was transferred to pharmacist and clarified with them that pt is on Toprol 50 mg daily. [employer] has approved Eliquis.  I spoke with pt and gave him information. He has supply of Toprol until he receives mail order supply.  He only has enough Eliquis to last until Wednesday.  We have no samples in office.  Will send prescription to [facility] in [place] and have Pam take free 30 day trial card to [place] on Wednesday.  Pt will come to [facility] office on Wednesday to pick up card.

## 2014-06-29 ENCOUNTER — Other Ambulatory Visit: Payer: Self-pay | Admitting: *Deleted

## 2014-06-29 MED ORDER — PRAMIPEXOLE DIHYDROCHLORIDE 1 MG PO TABS: 1.0000 mg | ORAL_TABLET | Freq: Every day | ORAL | Status: DC

## 2014-06-29 MED ORDER — LISINOPRIL 40 MG PO TABS
20.0000 mg | ORAL_TABLET | Freq: Every day | ORAL | Status: DC
Start: 2014-06-29 — End: 2014-11-02

## 2014-06-29 MED ORDER — HYDROCHLOROTHIAZIDE 25 MG PO TABS: 12.5000 mg | ORAL_TABLET | Freq: Every day | ORAL | Status: DC

## 2014-06-29 NOTE — Telephone Encounter (Signed)
Last ov 10/15. Verified with pt he wants meds from mail order. Pt states he is taking Lisinopril 40mg , 1/2 tablet daily and HCTZ 25mg , 1/2 tablet daily. Please review.

## 2014-06-29 NOTE — Telephone Encounter (Signed)
Thank you - will give the card to the patient with instruction when he comes to the [place] office.

## 2014-06-30 ENCOUNTER — Ambulatory Visit: Payer: Medicare PPO | Admitting: Cardiology

## 2014-07-05 ENCOUNTER — Encounter: Payer: Self-pay | Admitting: Cardiology

## 2014-07-19 ENCOUNTER — Ambulatory Visit (INDEPENDENT_AMBULATORY_CARE_PROVIDER_SITE_OTHER): Payer: Medicare PPO | Admitting: *Deleted

## 2014-07-19 DIAGNOSIS — E291 Testicular hypofunction: Secondary | ICD-10-CM

## 2014-07-19 DIAGNOSIS — E349 Endocrine disorder, unspecified: Secondary | ICD-10-CM

## 2014-07-22 ENCOUNTER — Other Ambulatory Visit: Payer: Self-pay | Admitting: *Deleted

## 2014-07-22 DIAGNOSIS — I4892 Unspecified atrial flutter: Secondary | ICD-10-CM

## 2014-07-22 DIAGNOSIS — R002 Palpitations: Secondary | ICD-10-CM

## 2014-07-22 MED ORDER — APIXABAN 2.5 MG PO TABS: 2.5000 mg | ORAL_TABLET | Freq: Two times a day (BID) | ORAL | Status: DC

## 2014-07-28 ENCOUNTER — Encounter: Payer: Self-pay | Admitting: Cardiology

## 2014-07-28 ENCOUNTER — Other Ambulatory Visit (INDEPENDENT_AMBULATORY_CARE_PROVIDER_SITE_OTHER): Payer: Medicare PPO

## 2014-07-28 ENCOUNTER — Ambulatory Visit (INDEPENDENT_AMBULATORY_CARE_PROVIDER_SITE_OTHER): Payer: Medicare PPO | Admitting: Cardiology

## 2014-07-28 VITALS — BP 148/80 | HR 68 | Ht 71.0 in | Wt 168.0 lb

## 2014-07-28 DIAGNOSIS — Z79899 Other long term (current) drug therapy: Secondary | ICD-10-CM

## 2014-07-28 DIAGNOSIS — I4892 Unspecified atrial flutter: Secondary | ICD-10-CM

## 2014-07-28 NOTE — Progress Notes (Signed)
HPI The patient presents as a new patient for me.  He is s/p CABG earlier this year.  He had been doing well.  He did have paroxysmal atrial flutter. He has been on anticoagulation. I did place an event monitor which did demonstrate paroxysmal flutter and fibrillation.   Since I last saw him he has not noticed any palpitations, presyncope or syncope. Other than briefly when he was wearing the monitor. He's had some occasional mild dizziness with changing positions in his head or bending over but no presyncope or syncope. He has a little shoulder discomfort but he was able to split wood yesterday without problems. He denies any chest pressure, neck or arm discomfort otherwise. He's not had any new shortness of breath though he's a little dyspneic with significant exertion. He's not having any PND or orthopnea.  Allergies  Allergen Reactions  . Lipitor [Atorvastatin] Other (See Comments)    "feel bad"    Current Outpatient Prescriptions  Medication Sig Dispense Refill  . apixaban (ELIQUIS) 2.5 MG TABS tablet Take 1 tablet (2.5 mg total) by mouth 2 (two) times daily. 60 tablet 2  . Cholecalciferol (VITAMIN D3) 1000 UNITS CAPS Take 1,000 Units by mouth daily.     . hydrochlorothiazide (HYDRODIURIL) 25 MG tablet Take 0.5 tablets (12.5 mg total) by mouth daily. 90 tablet 0  . levothyroxine (SYNTHROID, LEVOTHROID) 25 MCG tablet TAKE ONE TABLET BY MOUTH ONE TIME DAILY BEFORE BREAKFAST 30 tablet 2  . lisinopril (PRINIVIL,ZESTRIL) 40 MG tablet Take 0.5 tablets (20 mg total) by mouth daily. 90 tablet 0  . metoprolol succinate (TOPROL-XL) 50 MG 24 hr tablet Take 1 tablet (50 mg total) by mouth daily. 90 tablet 3  . omeprazole (PRILOSEC) 40 MG capsule Take 40 mg by mouth daily.    Marland Kitchen oxybutynin (DITROPAN) 5 MG tablet Take 5 mg by mouth daily.    . pramipexole (MIRAPEX) 1 MG tablet Take 1 tablet (1 mg total) by mouth at bedtime. 90 tablet 0  . testosterone cypionate (DEPOTESTOTERONE CYPIONATE) 200 MG/ML  injection Inject 300 mg into the muscle every 28 (twenty-eight) days.     Current Facility-Administered Medications  Medication Dose Route Frequency Provider Last Rate Last Dose  . testosterone cypionate (DEPOTESTOTERONE CYPIONATE) injection 300 mg  300 mg Intramuscular Q28 days Scott Herb, MD   300 mg at 07/19/14 1125    Past Medical History  Diagnosis Date  . Prostate cancer   . GERD (gastroesophageal reflux disease)   . Hypertension   . Hyperlipemia   . Arthritis   . Inguinal hernia     left  . Coronary artery disease   . Dysrhythmia   . Shortness of breath   . Pneumonia   . H/O hiatal hernia     Past Surgical History  Procedure Laterality Date  . Hernia repair    . Prostate surgery    . Colonoscopy    . Coronary artery bypass graft N/A 09/03/2013    Procedure: CORONARY ARTERY BYPASS GRAFTING times four using left internal mammary and bilateral saphenous vein.;  Surgeon: Ivin Poot, MD;  Location: [facility];  Service: Open Heart Surgery;  Laterality: N/A;  . Intraoperative transesophageal echocardiogram N/A 09/03/2013    Procedure: INTRAOPERATIVE TRANSESOPHAGEAL ECHOCARDIOGRAM;  Surgeon: Ivin Poot, MD;  Location: [facility];  Service: Open Heart Surgery;  Laterality: N/A;  . Coronary angioplasty    . Cataract extraction Bilateral   . Total knee arthroplasty Right 03/11/2014  Procedure: TOTAL KNEE ARTHROPLASTY;  Surgeon: Hessie Dibble, MD;  Location: [facility];  Service: Orthopedics;  Laterality: Right;    ROS:  As stated in the HPI and negative for all other systems.  PHYSICAL EXAM BP 148/80 mmHg  Pulse 68  Ht 5\' 11"  (1.803 m)  Wt 168 lb (76.204 kg)  BMI 23.44 kg/m2 GENERAL:  Well appearing HEENT:  Pupils equal round and reactive, fundi not visualized, oral mucosa unremarkable NECK:  No jugular venous distention, waveform within normal limits, carotid upstroke brisk and symmetric, no bruits, no thyromegaly LUNGS:  Clear to auscultation bilaterally CHEST: Well  healed sternotomy scar. HEART:  PMI not displaced or sustained,S1 and S2 within normal limits, no S3, no S4, no clicks, no rubs, no murmurs ABD:  Flat, positive bowel sounds normal in frequency in pitch, no bruits, no rebound, no guarding, no midline pulsatile mass, no hepatomegaly, no splenomegaly (of note he has mild hyperactive bowel sounds with expiration his chest it is very curious likely related to his large hiatal hernia). EXT:  2 plus pulses throughout, no edema, no cyanosis no clubbing   EKG:  Sinus rhythm, rate 68, left axis deviation, no acute ST-T wave changes. 07/28/2014  ASSESSMENT AND PLAN  ATRIAL FLUTTER/FIB:  Reviewing the monitor he had not only flutter but some fibrillation. Therefore, I don't think ablation is indicated. I will continue his anticoagulation.  I will order a CBC.  CAD:   The patient has no new sypmtoms.  No further cardiovascular testing is indicated.  We will continue with aggressive risk reduction and meds as listed.  HTN:  His BP is at target.  I will make changes as above.    RISK REDUCTION:   He did not tolerate Lipitor.  He has a good lipid profile.  I will defer to [name], MD  Lab Results  Component Value Date   CHOL 164 04/13/2014   TRIG 189* 04/13/2014   HDL 37* 04/13/2014   LDLCALC 89 04/13/2014

## 2014-07-28 NOTE — Patient Instructions (Signed)
The current medical regimen is effective;  continue present plan and medications.  Please have blood work (CBC)  Follow up in 6 months with Dr. Percival Spanish.  You will receive a letter in the mail 2 months before you are due.  Please call us when you receive this letter to schedule your follow up appointment.

## 2014-07-29 ENCOUNTER — Encounter (HOSPITAL_COMMUNITY): Payer: Self-pay | Admitting: Cardiovascular Disease

## 2014-07-29 LAB — CBC WITH DIFFERENTIAL
Basophils Absolute: 0 10*3/uL (ref 0.0–0.2)
Basos: 0 %
EOS: 3 %
Eosinophils Absolute: 0.2 10*3/uL (ref 0.0–0.4)
HCT: 43.5 % (ref 37.5–51.0)
Hemoglobin: 13.7 g/dL (ref 12.6–17.7)
IMMATURE GRANS (ABS): 0 10*3/uL (ref 0.0–0.1)
Immature Granulocytes: 0 %
LYMPHS: 17 %
Lymphocytes Absolute: 1.3 10*3/uL (ref 0.7–3.1)
MCH: 28.9 pg (ref 26.6–33.0)
MCHC: 31.5 g/dL (ref 31.5–35.7)
MCV: 92 fL (ref 79–97)
Monocytes Absolute: 0.9 10*3/uL (ref 0.1–0.9)
Monocytes: 11 %
NEUTROS ABS: 5.4 10*3/uL (ref 1.4–7.0)
NEUTROS PCT: 69 %
Platelets: 318 10*3/uL (ref 150–379)
RBC: 4.74 x10E6/uL (ref 4.14–5.80)
RDW: 14.8 % (ref 12.3–15.4)
WBC: 7.9 10*3/uL (ref 3.4–10.8)

## 2014-08-05 NOTE — Telephone Encounter (Signed)
Close encounter 

## 2014-08-11 ENCOUNTER — Telehealth: Payer: Self-pay | Admitting: Family Medicine

## 2014-08-11 NOTE — Telephone Encounter (Signed)
Wants a Z pack called to [facility]. Please advise

## 2014-08-12 MED ORDER — AZITHROMYCIN 250 MG PO TABS: ORAL_TABLET | ORAL | Status: DC

## 2014-08-12 NOTE — Telephone Encounter (Signed)
z pak sent to pharmacy- patient has sinus infection and no appointment available.

## 2014-08-17 ENCOUNTER — Encounter: Payer: Self-pay | Admitting: Family Medicine

## 2014-08-17 ENCOUNTER — Ambulatory Visit (INDEPENDENT_AMBULATORY_CARE_PROVIDER_SITE_OTHER): Payer: Medicare PPO | Admitting: Family Medicine

## 2014-08-17 VITALS — BP 180/92 | HR 55 | Temp 96.8°F | Ht 71.0 in | Wt 163.0 lb

## 2014-08-17 DIAGNOSIS — E785 Hyperlipidemia, unspecified: Secondary | ICD-10-CM

## 2014-08-17 DIAGNOSIS — C61 Malignant neoplasm of prostate: Secondary | ICD-10-CM

## 2014-08-17 DIAGNOSIS — E349 Endocrine disorder, unspecified: Secondary | ICD-10-CM

## 2014-08-17 DIAGNOSIS — K432 Incisional hernia without obstruction or gangrene: Secondary | ICD-10-CM

## 2014-08-17 DIAGNOSIS — I10 Essential (primary) hypertension: Secondary | ICD-10-CM

## 2014-08-17 DIAGNOSIS — E291 Testicular hypofunction: Secondary | ICD-10-CM

## 2014-08-17 DIAGNOSIS — I4891 Unspecified atrial fibrillation: Secondary | ICD-10-CM

## 2014-08-17 DIAGNOSIS — E559 Vitamin D deficiency, unspecified: Secondary | ICD-10-CM

## 2014-08-17 LAB — POCT CBC
Granulocyte percent: 78.8 %G (ref 37–80)
HCT, POC: 47.6 % (ref 43.5–53.7)
Hemoglobin: 14.8 g/dL (ref 14.1–18.1)
Lymph, poc: 1.5 (ref 0.6–3.4)
MCH, POC: 27.4 pg (ref 27–31.2)
MCHC: 31 g/dL — AB (ref 31.8–35.4)
MCV: 88.4 fL (ref 80–97)
MPV: 8.1 fL (ref 0–99.8)
POC GRANULOCYTE: 7.4 — AB (ref 2–6.9)
POC LYMPH %: 16.1 % (ref 10–50)
Platelet Count, POC: 261 10*3/uL (ref 142–424)
RBC: 5.4 M/uL (ref 4.69–6.13)
RDW, POC: 15 %
WBC: 9.4 10*3/uL (ref 4.6–10.2)

## 2014-08-17 LAB — POCT URINALYSIS DIPSTICK
Bilirubin, UA: NEGATIVE
GLUCOSE UA: NEGATIVE
Ketones, UA: NEGATIVE
Leukocytes, UA: NEGATIVE
Nitrite, UA: NEGATIVE
PH UA: 5
Protein, UA: NEGATIVE
RBC UA: NEGATIVE
SPEC GRAV UA: 1.015
Urobilinogen, UA: NEGATIVE

## 2014-08-17 LAB — POCT UA - MICROSCOPIC ONLY
BACTERIA, U MICROSCOPIC: NEGATIVE
CASTS, UR, LPF, POC: NEGATIVE
Crystals, Ur, HPF, POC: NEGATIVE
MUCUS UA: NEGATIVE
RBC, urine, microscopic: NEGATIVE
WBC, UR, HPF, POC: NEGATIVE
Yeast, UA: NEGATIVE

## 2014-08-17 NOTE — Patient Instructions (Addendum)
Medicare Annual Wellness Visit  [facility] and the medical providers at [facility] strive to bring you the best medical care.  In doing so we not only want to address your current medical conditions and concerns but also to detect new conditions early and prevent illness, disease and health-related problems.    Medicare offers a yearly Wellness Visit which allows our clinical staff to assess your need for preventative services including immunizations, lifestyle education, counseling to decrease risk of preventable diseases and screening for fall risk and other medical concerns.    This visit is provided free of charge (no copay) for all Medicare recipients. The clinical pharmacists at [facility] have begun to conduct these Wellness Visits which will also include a thorough review of all your medications.    As you primary medical provider recommend that you make an appointment for your Annual Wellness Visit if you have not done so already this year.  You may set up this appointment before you leave today or you may call back ([phone]) and schedule an appointment.  Please make sure when you call that you mention that you are scheduling your Annual Wellness Visit with the clinical pharmacist so that the appointment may be made for the proper length of time.     Continue current medications. Continue good therapeutic lifestyle changes which include good diet and exercise. Fall precautions discussed with patient. If an FOBT was given today- please return it to our front desk. If you are over 42 years old - you may need Prevnar 11 or the adult Pneumonia vaccine.  Flu Shots will be available at our office starting mid- September. Please call and schedule a FLU CLINIC APPOINTMENT.   Continue regular follow-ups with cardiology Monitor blood pressures closely over the next couple weeks and bring these readings in for review and have a  blood pressure check by the nurse in the office. Continue to watch sodium intake closely We'll call you with your cholesterol numbers, we will most likely restart your pravastatin medication for cholesterol

## 2014-08-17 NOTE — Progress Notes (Signed)
Subjective:    Patient ID: [name], male    DOB: Nov 05, 1926, 80 y.o.   MRN: [id number]  HPI Pt here for follow up and management of chronic medical problems. The patient complains today complains of a possible hernia on his abdominal [place] beneath the sternum. This is been noticed for about 1 month. He does not have any pain with this. Over the past year, the patient has had bypass surgery and right knee replacement. On a recent visit to the cardiologist, the patient had a blood pressure 148/80. He is taking Eliquis because of atrial fib/ flutter. A repeat blood pressure here was 185/82. The patient sees the cardiologist every 6 months on a regular basis. Is important to note that the patient is not taking his problems statin regularly. Somehow this was discontinued and we are not sure why. We will readdress this once we get his cholesterol numbers back. The patient is currently getting 300 mg of testosterone monthly and we will recheck a testosterone level today. His last injection was November 30. Systems he denies any specific complaints and notes that he has been out cutting wood recently. He could not give me what his home blood pressure readings are and he says he does not use a lot of sodium in his diet but does have a lot of canned foods.         Patient Active Problem List   Diagnosis Date Noted  . Atrial flutter 07/28/2014  . Palpitation 06/16/2014  . Pain in the chest 06/10/2014  . Right knee DJD 03/11/2014  . S/P CABG x 4 09/03/2013  . Insomnia 07/27/2013  . Testosterone deficiency 07/27/2013  . Osteoarthritis of right knee 07/27/2013  . Hyperlipidemia 06/10/2013  . Heart murmur 06/10/2013  . Dyspnea 06/10/2013  . Inguinal hernia, left. 04/04/2012  . Paraesophageal hiatal hernia 03/24/2012  . Prostate cancer 01/15/2012  . Special screening for malignant neoplasms, colon 01/15/2012   Outpatient Encounter Prescriptions as of 08/17/2014  Medication Sig  . apixaban  (ELIQUIS) 2.5 MG TABS tablet Take 1 tablet (2.5 mg total) by mouth 2 (two) times daily.  . Cholecalciferol (VITAMIN D3) 1000 UNITS CAPS Take 1,000 Units by mouth daily.   . hydrochlorothiazide (HYDRODIURIL) 25 MG tablet Take 0.5 tablets (12.5 mg total) by mouth daily.  Marland Kitchen levothyroxine (SYNTHROID, LEVOTHROID) 25 MCG tablet TAKE ONE TABLET BY MOUTH ONE TIME DAILY BEFORE BREAKFAST  . lisinopril (PRINIVIL,ZESTRIL) 40 MG tablet Take 0.5 tablets (20 mg total) by mouth daily.  . metoprolol succinate (TOPROL-XL) 50 MG 24 hr tablet Take 1 tablet (50 mg total) by mouth daily.  Marland Kitchen omeprazole (PRILOSEC) 40 MG capsule Take 40 mg by mouth daily.  Marland Kitchen oxybutynin (DITROPAN) 5 MG tablet Take 5 mg by mouth daily.  . pramipexole (MIRAPEX) 1 MG tablet Take 1 tablet (1 mg total) by mouth at bedtime.  Marland Kitchen testosterone cypionate (DEPOTESTOTERONE CYPIONATE) 200 MG/ML injection Inject 300 mg into the muscle every 28 (twenty-eight) days.  . [DISCONTINUED] azithromycin (ZITHROMAX Z-PAK) 250 MG tablet A sdirected    Review of Systems  Constitutional: Negative.   HENT: Negative.   Eyes: Negative.   Respiratory: Negative.   Cardiovascular: Negative.   Gastrointestinal: Negative.        Possible hernia  Endocrine: Negative.   Genitourinary: Negative.   Musculoskeletal: Negative.   Skin: Negative.   Allergic/Immunologic: Negative.   Neurological: Negative.   Hematological: Negative.   Psychiatric/Behavioral: Negative.        Objective:  Physical Exam  Constitutional: He is oriented to person, place, and time. He appears well-developed and well-nourished. No distress.  HENT:  Head: Normocephalic and atraumatic.  Right Ear: External ear normal.  Left Ear: External ear normal.  Nose: Nose normal.  Mouth/Throat: Oropharynx is clear and moist. No oropharyngeal exudate.  Eyes: Conjunctivae and EOM are normal. Pupils are equal, round, and reactive to light. Right eye exhibits no discharge. Left eye exhibits no  discharge. No scleral icterus.  Neck: Normal range of motion. Neck supple. No thyromegaly present.  Cardiovascular: Normal rate, regular rhythm, normal heart sounds and intact distal pulses.   No murmur heard. The heart was regular today at 60/m  Pulmonary/Chest: Effort normal and breath sounds normal. No respiratory distress. He has no wheezes. He has no rales. He exhibits no tenderness.  Abdominal: Soft. Bowel sounds are normal. He exhibits no mass. There is no tenderness. There is no rebound and no guarding.  There is an incisional hernia at the inferior margin of his bypass surgical scar.  Genitourinary: Rectum normal and penis normal.  The patient has incontinence and wears a pad in his underwear. The rectal exam revealed that the prostate vault was empty without any masses. There were no rectal masses. The external genitalia were normal. There was no hernia palpated. There were no inguinal nodes.  Musculoskeletal: Normal range of motion. He exhibits no edema or tenderness.  . Extremity movement was good following right knee replacement.  Lymphadenopathy:    He has no cervical adenopathy.  Neurological: He is alert and oriented to person, place, and time. He has normal reflexes. No cranial nerve deficit.  Skin: Skin is warm and dry. No rash noted. No erythema. No pallor.  Psychiatric: He has a normal mood and affect. His behavior is normal. Judgment and thought content normal.  Nursing note and vitals reviewed.  BP 196/79 mmHg  Pulse 55  Temp(Src) 96.8 F (36 C) (Oral)  Ht '5\' 11"'  (1.803 m)  Wt 163 lb (73.936 kg)  BMI 22.74 kg/m2        Assessment & Plan:  1. Testosterone deficiency - POCT CBC - Testosterone,Free and Total - POCT urinalysis dipstick - POCT UA - Microscopic Only - PSA, total and free  2. Atrial fibrillation, unspecified - POCT CBC  3. Hyperlipidemia - POCT CBC - NMR, lipoprofile  4. Prostate cancer - POCT CBC - POCT urinalysis dipstick - POCT UA -  Microscopic Only - PSA, total and free  5. Essential hypertension - POCT CBC - BMP8+EGFR - Hepatic function panel  6. Vitamin D deficiency - POCT CBC - Vit D  25 hydroxy (rtn osteoporosis monitoring)  7. Incisional hernia, without obstruction or gangrene  Patient Instructions                       Medicare Annual Wellness Visit  [facility] and the medical providers at [facility] strive to bring you the best medical care.  In doing so we not only want to address your current medical conditions and concerns but also to detect new conditions early and prevent illness, disease and health-related problems.    Medicare offers a yearly Wellness Visit which allows our clinical staff to assess your need for preventative services including immunizations, lifestyle education, counseling to decrease risk of preventable diseases and screening for fall risk and other medical concerns.    This visit is provided free of charge (no copay) for all Medicare recipients. The  clinical pharmacists at [facility] have begun to conduct these Wellness Visits which will also include a thorough review of all your medications.    As you primary medical provider recommend that you make an appointment for your Annual Wellness Visit if you have not done so already this year.  You may set up this appointment before you leave today or you may call back ([phone]) and schedule an appointment.  Please make sure when you call that you mention that you are scheduling your Annual Wellness Visit with the clinical pharmacist so that the appointment may be made for the proper length of time.     Continue current medications. Continue good therapeutic lifestyle changes which include good diet and exercise. Fall precautions discussed with patient. If an FOBT was given today- please return it to our front desk. If you are over 27 years old - you may need Prevnar 31 or the adult  Pneumonia vaccine.  Flu Shots will be available at our office starting mid- September. Please call and schedule a FLU CLINIC APPOINTMENT.   Continue regular follow-ups with cardiology Monitor blood pressures closely over the next couple weeks and bring these readings in for review and have a blood pressure check by the nurse in the office. Continue to watch sodium intake closely We'll call you with your cholesterol numbers, we will most likely restart your pravastatin medication for cholesterol   Arrie Senate MD

## 2014-08-19 ENCOUNTER — Other Ambulatory Visit: Payer: Self-pay | Admitting: *Deleted

## 2014-08-19 ENCOUNTER — Telehealth: Payer: Self-pay | Admitting: Family Medicine

## 2014-08-19 LAB — NMR, LIPOPROFILE
Cholesterol: 193 mg/dL (ref 100–199)
HDL CHOLESTEROL BY NMR: 40 mg/dL (ref >39)
HDL PARTICLE NUMBER: 28.5 umol/L — AB (ref >=30.5)
LDL Particle Number: 1464 nmol/L — ABNORMAL HIGH (ref <1000)
LDL Size: 20.1 nm (ref >20.5)
LDL-C: 106 mg/dL — ABNORMAL HIGH (ref 0–99)
LP-IR SCORE: 52 — AB (ref <=45)
Small LDL Particle Number: 744 nmol/L — ABNORMAL HIGH (ref <=527)
TRIGLYCERIDES BY NMR: 237 mg/dL — AB (ref 0–149)

## 2014-08-19 LAB — BMP8+EGFR
BUN / CREAT RATIO: 15 (ref 10–22)
BUN: 18 mg/dL (ref 8–27)
CALCIUM: 10.1 mg/dL (ref 8.6–10.2)
CO2: 27 mmol/L (ref 18–29)
CREATININE: 1.21 mg/dL (ref 0.76–1.27)
Chloride: 96 mmol/L — ABNORMAL LOW (ref 97–108)
GFR, EST AFRICAN AMERICAN: 63 mL/min/{1.73_m2} (ref >59)
GFR, EST NON AFRICAN AMERICAN: 54 mL/min/{1.73_m2} — AB (ref >59)
Glucose: 80 mg/dL (ref 65–99)
POTASSIUM: 4.7 mmol/L (ref 3.5–5.2)
SODIUM: 135 mmol/L (ref 134–144)

## 2014-08-19 LAB — HEPATIC FUNCTION PANEL
ALT: 13 IU/L (ref 0–44)
AST: 24 IU/L (ref 0–40)
Albumin: 4.4 g/dL (ref 3.5–4.7)
Alkaline Phosphatase: 90 IU/L (ref 39–117)
BILIRUBIN TOTAL: 0.6 mg/dL (ref 0.0–1.2)
Bilirubin, Direct: 0.14 mg/dL (ref 0.00–0.40)
Total Protein: 7 g/dL (ref 6.0–8.5)

## 2014-08-19 LAB — PSA, TOTAL AND FREE
PSA, Free: 0.01 ng/mL
PSA: <0.1 ng/mL (ref 0.0–4.0)

## 2014-08-19 LAB — TESTOSTERONE,FREE AND TOTAL
Testosterone, Free: 6.6 pg/mL (ref 6.6–18.1)
Testosterone: 460 ng/dL (ref 348–1197)

## 2014-08-19 LAB — VITAMIN D 25 HYDROXY (VIT D DEFICIENCY, FRACTURES): VIT D 25 HYDROXY: 31.2 ng/mL (ref 30.0–100.0)

## 2014-08-19 MED ORDER — PRAVASTATIN SODIUM 40 MG PO TABS: 40.0000 mg | ORAL_TABLET | Freq: Every day | ORAL | Status: DC

## 2014-08-19 NOTE — Telephone Encounter (Signed)
Pt aware to start at 40mg  and will adjust accordingly

## 2014-08-25 ENCOUNTER — Other Ambulatory Visit: Payer: Self-pay | Admitting: *Deleted

## 2014-08-25 DIAGNOSIS — R002 Palpitations: Secondary | ICD-10-CM

## 2014-08-25 DIAGNOSIS — I4892 Unspecified atrial flutter: Secondary | ICD-10-CM

## 2014-08-25 MED ORDER — APIXABAN 2.5 MG PO TABS: 2.5000 mg | ORAL_TABLET | Freq: Two times a day (BID) | ORAL | Status: DC

## 2014-08-27 ENCOUNTER — Other Ambulatory Visit: Payer: Self-pay | Admitting: Family Medicine

## 2014-08-30 ENCOUNTER — Ambulatory Visit (INDEPENDENT_AMBULATORY_CARE_PROVIDER_SITE_OTHER): Payer: Medicare PPO | Admitting: *Deleted

## 2014-08-30 VITALS — BP 174/79 | HR 55

## 2014-08-30 DIAGNOSIS — I10 Essential (primary) hypertension: Secondary | ICD-10-CM

## 2014-08-30 NOTE — Progress Notes (Signed)
Patient came in today for a BP check. Patients BP 174/79 mmHg  Pulse 55 Patient advised I would send reading over to Dr. Laurance Flatten to review and we will  call him if any changes needed to be made. Patient verbalizes understanding.

## 2014-08-30 NOTE — Patient Instructions (Signed)

## 2014-08-31 ENCOUNTER — Ambulatory Visit (INDEPENDENT_AMBULATORY_CARE_PROVIDER_SITE_OTHER): Payer: Medicare PPO | Admitting: *Deleted

## 2014-08-31 VITALS — BP 194/84 | HR 68

## 2014-08-31 DIAGNOSIS — I10 Essential (primary) hypertension: Secondary | ICD-10-CM

## 2014-08-31 NOTE — Progress Notes (Signed)
Patient aware and will come back this week to compare BP readings.

## 2014-08-31 NOTE — Progress Notes (Signed)
Pt came in for BP recheck Elevated BP with both machines meds reviewed with pt Pt stated he is taking all meds Denies chest pain Will come in for OV with Dr Laurance Flatten on 09/01/2014

## 2014-09-01 ENCOUNTER — Ambulatory Visit (INDEPENDENT_AMBULATORY_CARE_PROVIDER_SITE_OTHER): Payer: Medicare PPO | Admitting: *Deleted

## 2014-09-01 ENCOUNTER — Encounter: Payer: Self-pay | Admitting: Family Medicine

## 2014-09-01 VITALS — BP 179/81 | HR 58 | Temp 98.0°F | Ht 71.0 in | Wt 174.0 lb

## 2014-09-01 DIAGNOSIS — I10 Essential (primary) hypertension: Secondary | ICD-10-CM

## 2014-09-01 MED ORDER — AMLODIPINE BESYLATE 5 MG PO TABS: 5.0000 mg | ORAL_TABLET | Freq: Every day | ORAL | Status: DC

## 2014-09-01 NOTE — Progress Notes (Signed)
   Subjective:    Patient ID: [name], male    DOB: 01/19/1927, 81 y.o.   MRN: [id number]  HPI Patient here today for recheck on elevated Blood pressure. He has been keeping a record at home with his monitor and he has had our nurses here check it periodically as well. His current medication have been taken regularly.         Patient Active Problem List   Diagnosis Date Noted  . Incisional hernia, without obstruction or gangrene 08/17/2014  . Atrial flutter 07/28/2014  . Palpitation 06/16/2014  . Pain in the chest 06/10/2014  . Right knee DJD 03/11/2014  . S/P CABG x 4 09/03/2013  . Insomnia 07/27/2013  . Testosterone deficiency 07/27/2013  . Osteoarthritis of right knee 07/27/2013  . Hyperlipidemia 06/10/2013  . Heart murmur 06/10/2013  . Dyspnea 06/10/2013  . Inguinal hernia, left. 04/04/2012  . Paraesophageal hiatal hernia 03/24/2012  . Prostate cancer 01/15/2012  . Special screening for malignant neoplasms, colon 01/15/2012   Outpatient Encounter Prescriptions as of 09/01/2014  Medication Sig  . apixaban (ELIQUIS) 2.5 MG TABS tablet Take 1 tablet (2.5 mg total) by mouth 2 (two) times daily.  . Cholecalciferol (VITAMIN D3) 1000 UNITS CAPS Take 1,000 Units by mouth daily.   . hydrochlorothiazide (HYDRODIURIL) 25 MG tablet Take 0.5 tablets (12.5 mg total) by mouth daily.  Marland Kitchen levothyroxine (SYNTHROID, LEVOTHROID) 25 MCG tablet TAKE ONE TABLET BY MOUTH ONE TIME DAILY BEFORE BREAKFAST  . lisinopril (PRINIVIL,ZESTRIL) 40 MG tablet Take 0.5 tablets (20 mg total) by mouth daily.  . metoprolol succinate (TOPROL-XL) 50 MG 24 hr tablet Take 1 tablet (50 mg total) by mouth daily.  Marland Kitchen omeprazole (PRILOSEC) 40 MG capsule Take 40 mg by mouth daily.  Marland Kitchen oxybutynin (DITROPAN) 5 MG tablet Take 5 mg by mouth daily.  . pramipexole (MIRAPEX) 1 MG tablet TAKE 1 TABLET AT BEDTIME  . pravastatin (PRAVACHOL) 40 MG tablet Take 1 tablet (40 mg total) by mouth daily.  Marland Kitchen testosterone cypionate  (DEPOTESTOTERONE CYPIONATE) 200 MG/ML injection Inject 300 mg into the muscle every 28 (twenty-eight) days.    Review of Systems  Constitutional: Negative.   HENT: Negative.   Eyes: Negative.   Respiratory: Negative.   Cardiovascular: Negative.        Elevated BP   Gastrointestinal: Negative.   Endocrine: Negative.   Genitourinary: Negative.   Musculoskeletal: Negative.   Skin: Negative.   Allergic/Immunologic: Negative.   Neurological: Negative.   Hematological: Negative.   Psychiatric/Behavioral: Negative.        Objective:   Physical Exam BP 179/81 mmHg  Pulse 58  Temp(Src) 98 F (36.7 C) (Oral)  Ht 5\' 11"  (1.803 m)  Wt 174 lb (78.926 kg)  BMI 24.28 kg/m2        Assessment & Plan:  Per Dr.Moore - we will add on Amlodipine 5 mg that he used to take ( he already has a bottle of this) We will recheck BMP and BP in 2 weeks - for nurse visit  He will also bring in his home cuff and his home readings at this time - for [facility] to review.

## 2014-09-01 NOTE — Patient Instructions (Signed)
Come by our office in 2 weeks    BRING :  YOUR BP MONITOR AND BP HOME READINGS WE WILL DO A bmp (LAB) WE WILL DO A BP THIS DAY

## 2014-09-05 ENCOUNTER — Other Ambulatory Visit: Payer: Self-pay | Admitting: Family Medicine

## 2014-09-21 ENCOUNTER — Ambulatory Visit (INDEPENDENT_AMBULATORY_CARE_PROVIDER_SITE_OTHER): Payer: Medicare PPO | Admitting: *Deleted

## 2014-09-21 DIAGNOSIS — E291 Testicular hypofunction: Secondary | ICD-10-CM | POA: Diagnosis not present

## 2014-09-21 DIAGNOSIS — E349 Endocrine disorder, unspecified: Secondary | ICD-10-CM

## 2014-09-21 NOTE — Progress Notes (Signed)
Testosterone given and tolerated well.

## 2014-10-29 ENCOUNTER — Ambulatory Visit (INDEPENDENT_AMBULATORY_CARE_PROVIDER_SITE_OTHER): Payer: Medicare PPO | Admitting: *Deleted

## 2014-10-29 DIAGNOSIS — E291 Testicular hypofunction: Secondary | ICD-10-CM

## 2014-10-29 DIAGNOSIS — E349 Endocrine disorder, unspecified: Secondary | ICD-10-CM

## 2014-10-29 MED ORDER — TESTOSTERONE CYPIONATE 200 MG/ML IM SOLN
300.0000 mg | INTRAMUSCULAR | Status: DC
  Administered 2014-10-29 – 2014-12-28 (×2): 300 mg via INTRAMUSCULAR

## 2014-10-29 NOTE — Patient Instructions (Signed)

## 2014-10-29 NOTE — Progress Notes (Signed)
Testosterone given and tolerated well.

## 2014-11-02 ENCOUNTER — Other Ambulatory Visit: Payer: Self-pay

## 2014-11-02 MED ORDER — LISINOPRIL 40 MG PO TABS: 20.0000 mg | ORAL_TABLET | Freq: Every day | ORAL | Status: DC

## 2014-11-02 MED ORDER — HYDROCHLOROTHIAZIDE 25 MG PO TABS: 12.5000 mg | ORAL_TABLET | Freq: Every day | ORAL | Status: DC

## 2014-12-28 ENCOUNTER — Encounter: Payer: Self-pay | Admitting: Family Medicine

## 2014-12-28 ENCOUNTER — Ambulatory Visit (INDEPENDENT_AMBULATORY_CARE_PROVIDER_SITE_OTHER): Payer: Medicare PPO | Admitting: Family Medicine

## 2014-12-28 VITALS — BP 131/71 | HR 58 | Temp 97.0°F | Ht 71.0 in | Wt 160.0 lb

## 2014-12-28 DIAGNOSIS — R002 Palpitations: Secondary | ICD-10-CM | POA: Diagnosis not present

## 2014-12-28 DIAGNOSIS — I4891 Unspecified atrial fibrillation: Secondary | ICD-10-CM | POA: Diagnosis not present

## 2014-12-28 DIAGNOSIS — I10 Essential (primary) hypertension: Secondary | ICD-10-CM | POA: Diagnosis not present

## 2014-12-28 DIAGNOSIS — E785 Hyperlipidemia, unspecified: Secondary | ICD-10-CM | POA: Diagnosis not present

## 2014-12-28 DIAGNOSIS — E349 Endocrine disorder, unspecified: Secondary | ICD-10-CM

## 2014-12-28 DIAGNOSIS — H6123 Impacted cerumen, bilateral: Secondary | ICD-10-CM | POA: Diagnosis not present

## 2014-12-28 DIAGNOSIS — I4892 Unspecified atrial flutter: Secondary | ICD-10-CM

## 2014-12-28 DIAGNOSIS — E291 Testicular hypofunction: Secondary | ICD-10-CM | POA: Diagnosis not present

## 2014-12-28 DIAGNOSIS — E559 Vitamin D deficiency, unspecified: Secondary | ICD-10-CM

## 2014-12-28 DIAGNOSIS — C61 Malignant neoplasm of prostate: Secondary | ICD-10-CM

## 2014-12-28 DIAGNOSIS — L989 Disorder of the skin and subcutaneous tissue, unspecified: Secondary | ICD-10-CM

## 2014-12-28 LAB — POCT CBC
Granulocyte percent: 76.1 %G (ref 37–80)
HCT, POC: 44.6 % (ref 43.5–53.7)
Hemoglobin: 14.3 g/dL (ref 14.1–18.1)
LYMPH, POC: 1.5 (ref 0.6–3.4)
MCH, POC: 29.1 pg (ref 27–31.2)
MCHC: 32 g/dL (ref 31.8–35.4)
MCV: 90.9 fL (ref 80–97)
MPV: 8 fL (ref 0–99.8)
PLATELET COUNT, POC: 244 10*3/uL (ref 142–424)
POC GRANULOCYTE: 5.9 (ref 2–6.9)
POC LYMPH PERCENT: 18.7 %L (ref 10–50)
RBC: 4.9 M/uL (ref 4.69–6.13)
RDW, POC: 14.2 %
WBC: 7.8 10*3/uL (ref 4.6–10.2)

## 2014-12-28 MED ORDER — OMEPRAZOLE 40 MG PO CPDR: 40.0000 mg | DELAYED_RELEASE_CAPSULE | Freq: Every day | ORAL | Status: DC

## 2014-12-28 MED ORDER — PRAMIPEXOLE DIHYDROCHLORIDE 1 MG PO TABS: 1.0000 mg | ORAL_TABLET | Freq: Every day | ORAL | Status: DC

## 2014-12-28 MED ORDER — OXYBUTYNIN CHLORIDE 5 MG PO TABS: 5.0000 mg | ORAL_TABLET | Freq: Every day | ORAL | Status: DC

## 2014-12-28 MED ORDER — PRAVASTATIN SODIUM 40 MG PO TABS: 40.0000 mg | ORAL_TABLET | Freq: Every day | ORAL | Status: DC

## 2014-12-28 MED ORDER — METOPROLOL SUCCINATE ER 50 MG PO TB24: 50.0000 mg | ORAL_TABLET | Freq: Every day | ORAL | Status: DC

## 2014-12-28 MED ORDER — LEVOTHYROXINE SODIUM 25 MCG PO TABS: 25.0000 ug | ORAL_TABLET | Freq: Every day | ORAL | Status: DC

## 2014-12-28 MED ORDER — AMLODIPINE BESYLATE 5 MG PO TABS: 5.0000 mg | ORAL_TABLET | Freq: Every day | ORAL | Status: DC

## 2014-12-28 MED ORDER — LISINOPRIL 40 MG PO TABS: 20.0000 mg | ORAL_TABLET | Freq: Every day | ORAL | Status: DC

## 2014-12-28 MED ORDER — HYDROCHLOROTHIAZIDE 25 MG PO TABS: 12.5000 mg | ORAL_TABLET | Freq: Every day | ORAL | Status: DC

## 2014-12-28 NOTE — Addendum Note (Signed)
Addended by: Zannie Cove on: 12/28/2014 10:07 AM   Modules accepted: Orders

## 2014-12-28 NOTE — Progress Notes (Signed)
Subjective:    Patient ID: [name], male    DOB: 06-16-1927, 81 y.o.   MRN: [id number]  HPI Pt here for follow up and management of chronic medical problems which includes hypertension and hyperlipidemia. He is taking medications regularly. The patient denies chest pain or shortness of breath. His GI tract is giving him no problems. He was called by the urologist but did not call the urologist back about following up with his prostate cancer. He has appointment scheduled with the cardiologist soon. He denies chest pain. He denies any gastrointestinal problems including bleeding problems or dark tarry stools. He has Debrox eardrops at home to use for the ear. He does complain of some multiple skin tags around the neck and the irritation he has with these with shaving.      Patient Active Problem List   Diagnosis Date Noted  . Incisional hernia, without obstruction or gangrene 08/17/2014  . Atrial flutter 07/28/2014  . Palpitation 06/16/2014  . Pain in the chest 06/10/2014  . Right knee DJD 03/11/2014  . S/P CABG x 4 09/03/2013  . Insomnia 07/27/2013  . Testosterone deficiency 07/27/2013  . Osteoarthritis of right knee 07/27/2013  . Hyperlipidemia 06/10/2013  . Heart murmur 06/10/2013  . Dyspnea 06/10/2013  . Inguinal hernia, left. 04/04/2012  . Paraesophageal hiatal hernia 03/24/2012  . Prostate cancer 01/15/2012  . Special screening for malignant neoplasms, colon 01/15/2012   Outpatient Encounter Prescriptions as of 12/28/2014  Medication Sig  . amLODipine (NORVASC) 5 MG tablet Take 1 tablet (5 mg total) by mouth daily.  Marland Kitchen apixaban (ELIQUIS) 2.5 MG TABS tablet Take 1 tablet (2.5 mg total) by mouth 2 (two) times daily. (Patient taking differently: Take 5 mg by mouth 2 (two) times daily. )  . Cholecalciferol (VITAMIN D3) 1000 UNITS CAPS Take 1,000 Units by mouth daily.   . hydrochlorothiazide (HYDRODIURIL) 25 MG tablet Take 0.5 tablets (12.5 mg total) by mouth daily.  Marland Kitchen  levothyroxine (SYNTHROID, LEVOTHROID) 25 MCG tablet Take 1 tablet (25 mcg total) by mouth daily before breakfast.  . lisinopril (PRINIVIL,ZESTRIL) 40 MG tablet Take 0.5 tablets (20 mg total) by mouth daily.  . metoprolol succinate (TOPROL-XL) 50 MG 24 hr tablet Take 1 tablet (50 mg total) by mouth daily.  Marland Kitchen omeprazole (PRILOSEC) 40 MG capsule Take 40 mg by mouth daily.  Marland Kitchen oxybutynin (DITROPAN) 5 MG tablet Take 5 mg by mouth daily.  . pramipexole (MIRAPEX) 1 MG tablet TAKE 1 TABLET AT BEDTIME  . pravastatin (PRAVACHOL) 40 MG tablet Take 1 tablet (40 mg total) by mouth daily.  Marland Kitchen testosterone cypionate (DEPOTESTOTERONE CYPIONATE) 200 MG/ML injection Inject 300 mg into the muscle every 28 (twenty-eight) days.  . [DISCONTINUED] testosterone cypionate (DEPOTESTOTERONE CYPIONATE) injection 300 mg    No facility-administered encounter medications on file as of 12/28/2014.      Review of Systems  Constitutional: Positive for fatigue.  HENT: Negative.   Eyes: Negative.   Respiratory: Negative.   Cardiovascular: Negative.   Gastrointestinal: Negative.   Endocrine: Negative.   Genitourinary: Negative.   Musculoskeletal: Positive for arthralgias (left ankle pain from a fall).  Skin: Negative.   Allergic/Immunologic: Negative.   Neurological: Negative.   Hematological: Negative.   Psychiatric/Behavioral: Negative.        Objective:   Physical Exam  Constitutional: He is oriented to person, place, and time. He appears well-developed and well-nourished. No distress.  The patient is a pleasant and alert widowed white male.  HENT:  Head:  Normocephalic and atraumatic.  Right Ear: External ear normal.  Left Ear: External ear normal.  Nose: Nose normal.  Mouth/Throat: Oropharynx is clear and moist. No oropharyngeal exudate.  Eyes: Conjunctivae and EOM are normal. Pupils are equal, round, and reactive to light. Right eye exhibits no discharge. Left eye exhibits no discharge. No scleral icterus.    Neck: Normal range of motion. Neck supple. No thyromegaly present.  There are no bruits thyromegaly or anterior cervical adenopathy There are skin tags and actinic keratoses around the neck which gets irritated with shaving.  Cardiovascular: Normal rate, regular rhythm, normal heart sounds and intact distal pulses.  Exam reveals no gallop and no friction rub.   No murmur heard. At 60/m with a regular rate and rhythm  Pulmonary/Chest: Effort normal and breath sounds normal. No respiratory distress. He has no wheezes. He has no rales. He exhibits no tenderness.  There is no axillary adenopathy. Lungs are clear anteriorly and posteriorly  Abdominal: Soft. Bowel sounds are normal. He exhibits no mass. There is no tenderness. There is no rebound and no guarding.  Abdomen is nontender without masses or bruits or inguinal adenopathy  Musculoskeletal: Normal range of motion. He exhibits no edema or tenderness.  Patient has high arches in his feet. The left ankle is swollen and bruised with minimal tenderness. The patient indicates he got up from a sitting position and the ankle gave way. It is not painful.  Lymphadenopathy:    He has no cervical adenopathy.  Neurological: He is alert and oriented to person, place, and time. He has normal reflexes. No cranial nerve deficit.  Skin: Skin is warm and dry. No rash noted. No erythema. No pallor.  There is one large skin tag on the right side of the neck and there is some actinic keratoses on the left side of his neck  Psychiatric: He has a normal mood and affect. His behavior is normal. Judgment and thought content normal.  Nursing note and vitals reviewed.  BP 131/71 mmHg  Pulse 58  Temp(Src) 97 F (36.1 C) (Oral)  Ht '5\' 11"'  (1.803 m)  Wt 160 lb (72.576 kg)  BMI 22.33 kg/m2        Assessment & Plan:  1. Testosterone deficiency -The patient continues to take testosterone injections. He does complain of some fatigue. - POCT CBC  2. Essential  hypertension -The blood pressure today is 131/71 and is under good control. - POCT CBC - BMP8+EGFR - Hepatic function panel  3. Prostate cancer -The urologist office has call the patient and he is not return the call. He will call and see if he needs to see them again because of his history of prostate cancer. - POCT CBC - NMR, lipoprofile  4. Hyperlipidemia -Patient will continue to take his current cholesterol medicine pending results of lab work - POCT CBC  5. Vitamin D deficiency -The patient will continue his current dose of vitamin D pending results of lab work - POCT CBC - Vit D  25 hydroxy (rtn osteoporosis monitoring)  6. Atrial fibrillation, unspecified -The heart rhythm today was regular and he will follow-up as planned with his cardiologist - POCT CBC - BMP8+EGFR - Hepatic function panel - NMR, lipoprofile  7. Excessive cerumen in ear canal, bilateral -He will use Debrox eardrops for a couple weeks and return to the office for ear irrigation  8. Skin lesion -He will return to the office at the same time and have cryotherapy done on the skin  tag and the actinic keratosis on the left side of his neck  All of the patient's medications will be refilled today  Patient Instructions                       Medicare Annual Wellness Visit  [facility] and the medical providers at [facility] strive to bring you the best medical care.  In doing so we not only want to address your current medical conditions and concerns but also to detect new conditions early and prevent illness, disease and health-related problems.    Medicare offers a yearly Wellness Visit which allows our clinical staff to assess your need for preventative services including immunizations, lifestyle education, counseling to decrease risk of preventable diseases and screening for fall risk and other medical concerns.    This visit is provided free of charge (no copay) for all Medicare  recipients. The clinical pharmacists at [facility] have begun to conduct these Wellness Visits which will also include a thorough review of all your medications.    As you primary medical provider recommend that you make an appointment for your Annual Wellness Visit if you have not done so already this year.  You may set up this appointment before you leave today or you may call back ([phone]) and schedule an appointment.  Please make sure when you call that you mention that you are scheduling your Annual Wellness Visit with the clinical pharmacist so that the appointment may be made for the proper length of time.     Continue current medications. Continue good therapeutic lifestyle changes which include good diet and exercise. Fall precautions discussed with patient. If an FOBT was given today- please return it to our front desk. If you are over 37 years old - you may need Prevnar 24 or the adult Pneumonia vaccine.  Flu Shots are still available at our office. If you still haven't had one please call to set up a nurse visit to get one.   After your visit with [place] today you will receive a survey in the mail or online from [employer] regarding your care with [place]. Please take a moment to fill this out. Your feedback is very important to [place] as you can help [place] better understand your patient needs as well as improve your experience and satisfaction. WE CARE ABOUT YOU!!!   Use Debrox eardrops 3-4 drops each ear nightly for 3 nights, weight 1 week and repeat Return to clinic in about 3 weeks and let her nurse irrigate the cerumen out of both ear canals and we will also let one of our providers do cryotherapy on the skin lesions on your neck Elevate the ankle that you have sprain and be more careful for a few days until the swelling is gone Keep appointments with cardiology and urology      Arrie Senate MD

## 2014-12-28 NOTE — Patient Instructions (Addendum)
Medicare Annual Wellness Visit  [facility] and the medical providers at [facility] strive to bring you the best medical care.  In doing so we not only want to address your current medical conditions and concerns but also to detect new conditions early and prevent illness, disease and health-related problems.    Medicare offers a yearly Wellness Visit which allows our clinical staff to assess your need for preventative services including immunizations, lifestyle education, counseling to decrease risk of preventable diseases and screening for fall risk and other medical concerns.    This visit is provided free of charge (no copay) for all Medicare recipients. The clinical pharmacists at [facility] have begun to conduct these Wellness Visits which will also include a thorough review of all your medications.    As you primary medical provider recommend that you make an appointment for your Annual Wellness Visit if you have not done so already this year.  You may set up this appointment before you leave today or you may call back ([phone]) and schedule an appointment.  Please make sure when you call that you mention that you are scheduling your Annual Wellness Visit with the clinical pharmacist so that the appointment may be made for the proper length of time.     Continue current medications. Continue good therapeutic lifestyle changes which include good diet and exercise. Fall precautions discussed with patient. If an FOBT was given today- please return it to our front desk. If you are over 57 years old - you may need Prevnar 76 or the adult Pneumonia vaccine.  Flu Shots are still available at our office. If you still haven't had one please call to set up a nurse visit to get one.   After your visit with [place] today you will receive a survey in the mail or online from [employer] regarding your care with [place]. Please take a moment to  fill this out. Your feedback is very important to [place] as you can help [place] better understand your patient needs as well as improve your experience and satisfaction. WE CARE ABOUT YOU!!!   Use Debrox eardrops 3-4 drops each ear nightly for 3 nights, weight 1 week and repeat Return to clinic in about 3 weeks and let her nurse irrigate the cerumen out of both ear canals and we will also let one of our providers do cryotherapy on the skin lesions on your neck Elevate the ankle that you have sprain and be more careful for a few days until the swelling is gone Keep appointments with cardiology and urology

## 2014-12-29 LAB — HEPATIC FUNCTION PANEL
ALT: 17 IU/L (ref 0–44)
AST: 20 IU/L (ref 0–40)
Albumin: 4.5 g/dL (ref 3.5–4.7)
Alkaline Phosphatase: 101 IU/L (ref 39–117)
BILIRUBIN TOTAL: 0.8 mg/dL (ref 0.0–1.2)
Bilirubin, Direct: 0.22 mg/dL (ref 0.00–0.40)
Total Protein: 6.8 g/dL (ref 6.0–8.5)

## 2014-12-29 LAB — BMP8+EGFR
BUN/Creatinine Ratio: 17 (ref 10–22)
BUN: 22 mg/dL (ref 8–27)
CALCIUM: 10.4 mg/dL — AB (ref 8.6–10.2)
CHLORIDE: 96 mmol/L — AB (ref 97–108)
CO2: 27 mmol/L (ref 18–29)
Creatinine, Ser: 1.3 mg/dL — ABNORMAL HIGH (ref 0.76–1.27)
GFR calc Af Amer: 58 mL/min/{1.73_m2} — ABNORMAL LOW (ref >59)
GFR calc non Af Amer: 50 mL/min/{1.73_m2} — ABNORMAL LOW (ref >59)
Glucose: 84 mg/dL (ref 65–99)
Potassium: 4.7 mmol/L (ref 3.5–5.2)
Sodium: 138 mmol/L (ref 134–144)

## 2014-12-29 LAB — NMR, LIPOPROFILE
Cholesterol: 134 mg/dL (ref 100–199)
HDL Cholesterol by NMR: 41 mg/dL (ref >39)
HDL Particle Number: 26.3 umol/L — ABNORMAL LOW (ref >=30.5)
LDL PARTICLE NUMBER: 937 nmol/L (ref <1000)
LDL SIZE: 20 nm (ref >20.5)
LDL-C: 66 mg/dL (ref 0–99)
LP-IR SCORE: 36 (ref <=45)
Small LDL Particle Number: 634 nmol/L — ABNORMAL HIGH (ref <=527)
Triglycerides by NMR: 133 mg/dL (ref 0–149)

## 2014-12-29 LAB — VITAMIN D 25 HYDROXY (VIT D DEFICIENCY, FRACTURES): VIT D 25 HYDROXY: 44.4 ng/mL (ref 30.0–100.0)

## 2015-01-12 ENCOUNTER — Ambulatory Visit (INDEPENDENT_AMBULATORY_CARE_PROVIDER_SITE_OTHER): Payer: Medicare PPO | Admitting: Physician Assistant

## 2015-01-12 ENCOUNTER — Encounter: Payer: Self-pay | Admitting: Physician Assistant

## 2015-01-12 VITALS — BP 165/73 | HR 57 | Temp 97.2°F | Ht 71.0 in | Wt 165.0 lb

## 2015-01-12 DIAGNOSIS — Z1212 Encounter for screening for malignant neoplasm of rectum: Secondary | ICD-10-CM | POA: Diagnosis not present

## 2015-01-12 DIAGNOSIS — Z23 Encounter for immunization: Secondary | ICD-10-CM | POA: Diagnosis not present

## 2015-01-12 DIAGNOSIS — L820 Inflamed seborrheic keratosis: Secondary | ICD-10-CM | POA: Diagnosis not present

## 2015-01-12 DIAGNOSIS — H6123 Impacted cerumen, bilateral: Secondary | ICD-10-CM

## 2015-01-12 DIAGNOSIS — L570 Actinic keratosis: Secondary | ICD-10-CM | POA: Diagnosis not present

## 2015-01-12 DIAGNOSIS — X32XXXA Exposure to sunlight, initial encounter: Principal | ICD-10-CM

## 2015-01-12 NOTE — Addendum Note (Signed)
Addended by: Marylin Crosby on: 01/12/2015 09:31 AM   Modules accepted: Orders

## 2015-01-12 NOTE — Progress Notes (Signed)
   Subjective:    Patient ID: [name], male    DOB: 01/23/1927, 81 y.o.   MRN: [id number]  HPI 81 y/o male presents for wash out of ears and new appearing skin lesions on his neck that are inflamed and irritated by clothing.     Review of Systems  HENT:       Feels like his ears are "full" and has decreased hearing        Objective:   Physical Exam  Constitutional: He is oriented to person, place, and time. He appears well-developed and well-nourished. No distress.  HENT:  Cerumen impaction on bilateral ear canal  Cardiovascular: Normal rate.   Pulmonary/Chest: Effort normal.  Neurological: He is alert and oriented to person, place, and time.  Skin: He is not diaphoretic.  Inflamed hyperkeratotic seborrheic keratosis on neck Areas of actinic changes on BUE  Psychiatric: He has a normal mood and affect.  Nursing note and vitals reviewed.         Assessment & Plan:  1. Screening for malignant neoplasm of the rectum Patient turned in stool card. Will report results when obtained - Fecal occult blood, imunochemical  2. Actinic keratosis due to exposure to sunlight Areas on BUE treated with cryosurgery x 9. Advised patient to rtc if lesions recur for biopsy to r/o BCC/SCC  3. Inflamed seborrheic keratosis -lesions on neck treated with cryosurgery x 2.   4. Cerumen impaction bilateral - Removed via ear lavage and manual currettage   Continue all meds Labs pending Health Maintenance reviewed Diet and exercise encouraged RTO prn   Antonios Ostrow A. Benjamin Stain PA-C

## 2015-01-12 NOTE — Patient Instructions (Signed)
Seborrheic Keratosis Seborrheic keratosis is a common, noncancerous (benign) skin growth that can occur anywhere on the skin.It looks like "stuck-on," waxy, rough, tan, brown, or black spots on the skin. These skin growths can be flat or raised.They are often called "barnacles" because of their pasted-on appearance.Usually, these skin growths appear in adulthood, around age 79, and increase in number as you age. They may also develop during pregnancy or following estrogen therapy. Many people may only have one growth appear in their lifetime, while some people may develop many growths. CAUSES It is unknown what causes these skin growths, but they appear to run in families. SYMPTOMS Seborrheic keratosis is often located on the face, chest, shoulders, back, or other areas. These growths are:  Usually painless, but may become irritated and itchy.  Yellow, brown, black, or other colors.  Slightly raised or have a flat surface.  Sometimes rough or wart-like in texture.  Often waxy on the surface.  Round or oval-shaped.  Sometimes "stuck-on" in appearance.  Sometimes single, but there are usually many growths. Any growth that bleeds, itches on a regular basis, becomes inflamed, or becomes irritated needs to be evaluated by a skin specialist (dermatologist). DIAGNOSIS Diagnosis is mainly based on the way the growths appear. In some cases, it can be difficult to tell this type of skin growth from skin cancer. A skin growth tissue sample (biopsy) may be used to confirm the diagnosis. TREATMENT Most often, treatment is not needed because the skin growths are benign.If the skin growth is irritated easily by clothing or jewelry, causing it to scab or bleed, treatment may be recommended. Patients may also choose to have the growths removed because they do not like their appearance. Most commonly, these growths are treated with cryosurgery. In cryosurgery, liquid nitrogen is applied to "freeze" the  growth. The growth usually falls off within a matter of days. A blister may form and dry into a scab that will also fall off. After the growth or scab falls off, it may leave a dark or light spot on the skin. This color may fade over time, or it may remain permanent on the skin. HOME CARE INSTRUCTIONS If the skin growths are treated with cryosurgery, the treated area needs to be kept clean with water and soap. SEEK MEDICAL CARE IF:  You have questions about these growths or other skin problems.  You develop new symptoms, including:  A change in the appearance of the skin growth.  New growths.  Any bleeding, itching, or pain in the growths.  A skin growth that looks similar to seborrheic keratosis. Document Released: 09/08/2010 Document Revised: 10/29/2011 Document Reviewed: 09/08/2010 ExitCare Patient Information 2015 [employer]. This information is not intended to replace advice given to you by your health care provider. Make sure you discuss any questions you have with your health care provider.  

## 2015-01-15 LAB — FECAL OCCULT BLOOD, IMMUNOCHEMICAL: Fecal Occult Bld: NEGATIVE

## 2015-01-26 ENCOUNTER — Encounter: Payer: Self-pay | Admitting: Cardiology

## 2015-01-26 ENCOUNTER — Ambulatory Visit (INDEPENDENT_AMBULATORY_CARE_PROVIDER_SITE_OTHER): Payer: Medicare PPO | Admitting: Cardiology

## 2015-01-26 VITALS — BP 138/70 | HR 56 | Ht 71.0 in | Wt 163.0 lb

## 2015-01-26 DIAGNOSIS — I4892 Unspecified atrial flutter: Secondary | ICD-10-CM

## 2015-01-26 MED ORDER — APIXABAN 2.5 MG PO TABS: 2.5000 mg | ORAL_TABLET | Freq: Two times a day (BID) | ORAL | Status: DC

## 2015-01-26 NOTE — Progress Notes (Signed)
HPI The patient presents as a new patient for me.  He is s/p CABG in 2014.  He did have paroxysmal atrial flutter post up I did place an event monitor which did demonstrate paroxysmal flutter and fibrillation.  He has been on anticoagulation.  Today he was raking hay.  He did well with this.  The patient denies any new symptoms such as chest discomfort, neck or arm discomfort. There has been no new shortness of breath, PND or orthopnea. There have been no reported palpitations, presyncope or syncope.    Allergies  Allergen Reactions  . Lipitor [Atorvastatin] Other (See Comments)    "feel bad"  . Penicillins Itching    Current Outpatient Prescriptions  Medication Sig Dispense Refill  . amLODipine (NORVASC) 5 MG tablet Take 1 tablet (5 mg total) by mouth daily. 90 tablet 1  . apixaban (ELIQUIS) 5 MG TABS tablet Take 5 mg by mouth 2 (two) times daily.    . Cholecalciferol (VITAMIN D3) 1000 UNITS CAPS Take 1,000 Units by mouth daily.     . hydrochlorothiazide (HYDRODIURIL) 25 MG tablet Take 0.5 tablets (12.5 mg total) by mouth daily. 90 tablet 3  . levothyroxine (SYNTHROID, LEVOTHROID) 25 MCG tablet Take 1 tablet (25 mcg total) by mouth daily before breakfast. 30 tablet 9  . lisinopril (PRINIVIL,ZESTRIL) 40 MG tablet Take 0.5 tablets (20 mg total) by mouth daily. 90 tablet 3  . metoprolol succinate (TOPROL-XL) 50 MG 24 hr tablet Take 1 tablet (50 mg total) by mouth daily. 90 tablet 3  . omeprazole (PRILOSEC) 40 MG capsule Take 1 capsule (40 mg total) by mouth daily. 90 capsule 3  . oxybutynin (DITROPAN) 5 MG tablet Take 1 tablet (5 mg total) by mouth daily. 90 tablet 3  . pramipexole (MIRAPEX) 1 MG tablet Take 1 tablet (1 mg total) by mouth at bedtime. 90 tablet 3  . pravastatin (PRAVACHOL) 40 MG tablet Take 1 tablet (40 mg total) by mouth daily. 90 tablet 3  . testosterone cypionate (DEPOTESTOTERONE CYPIONATE) 200 MG/ML injection Inject 300 mg into the muscle every 28 (twenty-eight) days.      No current facility-administered medications for this visit.    Past Medical History  Diagnosis Date  . Prostate cancer   . GERD (gastroesophageal reflux disease)   . Hypertension   . Hyperlipemia   . Arthritis   . Inguinal hernia     left  . Coronary artery disease   . Dysrhythmia   . Shortness of breath   . Pneumonia   . H/O hiatal hernia     Past Surgical History  Procedure Laterality Date  . Hernia repair    . Prostate surgery    . Colonoscopy    . Coronary artery bypass graft N/A 09/03/2013    Procedure: CORONARY ARTERY BYPASS GRAFTING times four using left internal mammary and bilateral saphenous vein.;  Surgeon: Ivin Poot, MD;  Location: [facility];  Service: Open Heart Surgery;  Laterality: N/A;  . Intraoperative transesophageal echocardiogram N/A 09/03/2013    Procedure: INTRAOPERATIVE TRANSESOPHAGEAL ECHOCARDIOGRAM;  Surgeon: Ivin Poot, MD;  Location: [facility];  Service: Open Heart Surgery;  Laterality: N/A;  . Coronary angioplasty    . Cataract extraction Bilateral   . Total knee arthroplasty Right 03/11/2014    Procedure: TOTAL KNEE ARTHROPLASTY;  Surgeon: Hessie Dibble, MD;  Location: [facility];  Service: Orthopedics;  Laterality: Right;  . Left heart catheterization with coronary angiogram N/A 08/03/2013    Procedure:  LEFT HEART CATHETERIZATION WITH CORONARY ANGIOGRAM;  Surgeon: Blane Ohara, MD;  Location[facility] CATH LAB;  Service: Cardiovascular;  Laterality: N/A;    ROS:  As stated in the HPI and negative for all other systems.  PHYSICAL EXAM BP 138/70 mmHg  Pulse 56  Ht 5\' 11"  (1.803 m)  Wt 163 lb (73.936 kg)  BMI 22.74 kg/m2 GENERAL:  Well appearing HEENT:  Pupils equal round and reactive, fundi not visualized, oral mucosa unremarkable NECK:  No jugular venous distention, waveform within normal limits, carotid upstroke brisk and symmetric, no bruits, no thyromegaly LUNGS:  Clear to auscultation bilaterally CHEST: Well healed sternotomy  scar. HEART:  PMI not displaced or sustained,S1 and S2 within normal limits, no S3, no S4, no clicks, no rubs, 3/6 apical systolic murmur early peaking and radiating out the aortic outflow tract, no diastolic murmurs ABD:  Flat, positive bowel sounds normal in frequency in pitch, no bruits, no rebound, no guarding, no midline pulsatile mass, no hepatomegaly, no splenomegaly (of note he has mild hyperactive bowel sounds with expiration his chest it is very curious likely related to his large hiatal hernia). EXT:  2 plus pulses throughout, no edema, no cyanosis no clubbing   EKG:  Sinus rhythm, rate 56, left axis deviation, no acute ST-T wave changes. 01/26/2015  ASSESSMENT AND PLAN  ATRIAL FLUTTER/FIB:   He will continue with anticoagulation. He's had paroxysms of fibrillation and flutter. He's on a borderline with a lower dose of Eliquis and I will have him start 2-1/2 twice a day. He's been taking the higher dose. However, his creatinine has been elevated recently and he is almost 81 years old.  CAD:   The patient has no new sypmtoms.  No further cardiovascular testing is indicated.  We will continue with aggressive risk reduction and meds as listed.  HTN:  His BP is at target.  I will make changes as above.    RISK REDUCTION:    Per Dr. [name], MD

## 2015-01-26 NOTE — Patient Instructions (Signed)
Medication Instructions:  Your physician recommends that you continue on your current medications as directed. Please refer to the Current Medication list given to you today.  Follow-Up: Follow up in 1 year with Dr. Skains.  You will receive a letter in the mail 2 months before you are due.  Please call us when you receive this letter to schedule your follow up appointment.  Thank you for choosing [facility]!!       

## 2015-02-28 ENCOUNTER — Ambulatory Visit (INDEPENDENT_AMBULATORY_CARE_PROVIDER_SITE_OTHER): Payer: Medicare PPO | Admitting: Nurse Practitioner

## 2015-02-28 ENCOUNTER — Encounter: Payer: Self-pay | Admitting: Nurse Practitioner

## 2015-02-28 VITALS — BP 96/65 | HR 84 | Ht 71.0 in | Wt 159.2 lb

## 2015-02-28 DIAGNOSIS — R002 Palpitations: Secondary | ICD-10-CM

## 2015-02-28 NOTE — Patient Instructions (Signed)
Palpitations  A palpitation is the feeling that your heartbeat is irregular. It may feel like your heart is fluttering or skipping a beat. It may also feel like your heart is beating faster than normal. This is usually not a serious problem. In some cases, you may need more medical tests.  HOME CARE  · Avoid:  ¨ Caffeine in coffee, tea, soft drinks, diet pills, and energy drinks.  ¨ Chocolate.  ¨ Alcohol.  · Stop smoking if you smoke.  · Reduce your stress and anxiety. Try:  ¨ A method that measures bodily functions so you can learn to control them (biofeedback).  ¨ Yoga.  ¨ Meditation.  ¨ Physical activity such as swimming, jogging, or walking.  · Get plenty of rest and sleep.  GET HELP IF:  · Your fast or irregular heartbeat continues after 24 hours.  · Your palpitations occur more often.  GET HELP RIGHT AWAY IF:   · You have chest pain.  · You feel short of breath.  · You have a very bad headache.  · You feel dizzy or pass out (faint).  MAKE SURE YOU:   · Understand these instructions.  · Will watch your condition.  · Will get help right away if you are not doing well or get worse.  Document Released: 05/15/2008 Document Revised: 12/21/2013 Document Reviewed: 10/05/2011  ExitCare® Patient Information ©2015 [employer]. This information is not intended to replace advice given to you by your health care provider. Make sure you discuss any questions you have with your health care provider.

## 2015-02-28 NOTE — Progress Notes (Signed)
   Subjective:    Patient ID: [name], male    DOB: 1926/12/24, 81 y.o.   MRN: [id number]  HPI Patient woke up this morning with right shoulder pain- put heating pad on it and it got better. An hour later it felt like his heart was racing. Has history  Of atrial flutter- lasted about about 1-2 hours. Currently feeling okay.     Review of Systems  Constitutional: Negative.   HENT: Negative.   Respiratory: Negative.   Cardiovascular: Negative.   Gastrointestinal: Negative.   Genitourinary: Negative.   Neurological: Negative.   Psychiatric/Behavioral: Negative.   All other systems reviewed and are negative.      Objective:   Physical Exam  Constitutional: He is oriented to person, place, and time. He appears well-developed and well-nourished.  Cardiovascular: Normal rate and regular rhythm.   Murmur (3/6 systolic murmur) heard. Pulmonary/Chest: Effort normal and breath sounds normal.  Neurological: He is alert and oriented to person, place, and time.  Skin: Skin is warm and dry.  Psychiatric: He has a normal mood and affect. His behavior is normal. Judgment and thought content normal.    BP 96/65 mmHg  Pulse 84  Ht 5\' 11"  (1.803 m)  Wt 159 lb 3.2 oz (72.213 kg)  BMI 22.21 kg/m2  EKG- unchanged from previous 1 month ago      Assessment & Plan:   1. Palpitations    Continue all meds Keep diary of episodes for cardiologist Avoid caffeine RTO prn  Mary-Margaret Hassell Done, FNP

## 2015-03-01 ENCOUNTER — Telehealth: Payer: Self-pay | Admitting: Cardiology

## 2015-03-01 NOTE — Telephone Encounter (Signed)
Spoke with pt about his s/s - states he just retook his BP/HR and it was 95/66 HR 63.  He does not feel like he is in an abnormal rhythm.  Advised to make sure he is drinking plenty of water to stay dehydrated and to avoid stimulates with any caffeine including soda, tea, coffee, chocolate and OTC medications..  He stated understanding.  He will keep a check on his BP and HR and let us know if the irregular heartbeat reoccurs.  He is on Eliquis 2.5 mg twice a day.  Had normal EKG yesterday at Dr [facility] office.

## 2015-03-01 NOTE — Telephone Encounter (Signed)
New message      Patient c/o Palpitations:  High priority if patient c/o lightheadedness and shortness of breath.  1. How long have you been having palpitations? Heart started beating fast last fri-------HR 133 highest  2. Are you currently experiencing lightheadedness and shortness of breath?no 3. Have you checked your BP and heart rate? (document readings)  Now HR is 127 and bp is 110/84   4. Are you experiencing any other symptoms? No Patient saw the NP (at his PCP office) yesterday---ekg was normal at that time.  Fast HR comes and goes.  Please advise

## 2015-03-28 ENCOUNTER — Emergency Department (HOSPITAL_COMMUNITY): Payer: Medicare PPO

## 2015-03-28 ENCOUNTER — Inpatient Hospital Stay (HOSPITAL_COMMUNITY)
Admission: EM | Admit: 2015-03-28 | Discharge: 2015-04-08 | DRG: 853 | Disposition: A | Discharge status: Skilled Nursing Facility | Payer: Medicare PPO | Attending: Internal Medicine | Admitting: Internal Medicine

## 2015-03-28 ENCOUNTER — Encounter (HOSPITAL_COMMUNITY): Payer: Self-pay | Admitting: Emergency Medicine

## 2015-03-28 ENCOUNTER — Inpatient Hospital Stay (HOSPITAL_COMMUNITY): Payer: Medicare PPO

## 2015-03-28 DIAGNOSIS — A4151 Sepsis due to Escherichia coli [E. coli]: Secondary | ICD-10-CM | POA: Diagnosis present

## 2015-03-28 DIAGNOSIS — A419 Sepsis, unspecified organism: Secondary | ICD-10-CM | POA: Diagnosis not present

## 2015-03-28 DIAGNOSIS — Z79899 Other long term (current) drug therapy: Secondary | ICD-10-CM

## 2015-03-28 DIAGNOSIS — Z8546 Personal history of malignant neoplasm of prostate: Secondary | ICD-10-CM | POA: Diagnosis not present

## 2015-03-28 DIAGNOSIS — I509 Heart failure, unspecified: Secondary | ICD-10-CM | POA: Diagnosis not present

## 2015-03-28 DIAGNOSIS — R7989 Other specified abnormal findings of blood chemistry: Secondary | ICD-10-CM

## 2015-03-28 DIAGNOSIS — N170 Acute kidney failure with tubular necrosis: Secondary | ICD-10-CM | POA: Diagnosis present

## 2015-03-28 DIAGNOSIS — R791 Abnormal coagulation profile: Secondary | ICD-10-CM | POA: Diagnosis present

## 2015-03-28 DIAGNOSIS — I484 Atypical atrial flutter: Secondary | ICD-10-CM | POA: Diagnosis not present

## 2015-03-28 DIAGNOSIS — D696 Thrombocytopenia, unspecified: Secondary | ICD-10-CM | POA: Diagnosis present

## 2015-03-28 DIAGNOSIS — M1711 Unilateral primary osteoarthritis, right knee: Secondary | ICD-10-CM | POA: Diagnosis present

## 2015-03-28 DIAGNOSIS — Z7901 Long term (current) use of anticoagulants: Secondary | ICD-10-CM | POA: Diagnosis not present

## 2015-03-28 DIAGNOSIS — Z87891 Personal history of nicotine dependence: Secondary | ICD-10-CM

## 2015-03-28 DIAGNOSIS — K219 Gastro-esophageal reflux disease without esophagitis: Secondary | ICD-10-CM | POA: Diagnosis present

## 2015-03-28 DIAGNOSIS — I4892 Unspecified atrial flutter: Secondary | ICD-10-CM | POA: Diagnosis present

## 2015-03-28 DIAGNOSIS — E7849 Other hyperlipidemia: Secondary | ICD-10-CM | POA: Diagnosis present

## 2015-03-28 DIAGNOSIS — Z951 Presence of aortocoronary bypass graft: Secondary | ICD-10-CM

## 2015-03-28 DIAGNOSIS — K449 Diaphragmatic hernia without obstruction or gangrene: Secondary | ICD-10-CM | POA: Diagnosis not present

## 2015-03-28 DIAGNOSIS — I272 Other secondary pulmonary hypertension: Secondary | ICD-10-CM | POA: Diagnosis present

## 2015-03-28 DIAGNOSIS — Z880 Allergy status to penicillin: Secondary | ICD-10-CM

## 2015-03-28 DIAGNOSIS — R6521 Severe sepsis with septic shock: Secondary | ICD-10-CM | POA: Diagnosis present

## 2015-03-28 DIAGNOSIS — K8020 Calculus of gallbladder without cholecystitis without obstruction: Secondary | ICD-10-CM

## 2015-03-28 DIAGNOSIS — G9341 Metabolic encephalopathy: Secondary | ICD-10-CM | POA: Diagnosis present

## 2015-03-28 DIAGNOSIS — J9600 Acute respiratory failure, unspecified whether with hypoxia or hypercapnia: Secondary | ICD-10-CM

## 2015-03-28 DIAGNOSIS — N183 Chronic kidney disease, stage 3 (moderate): Secondary | ICD-10-CM | POA: Diagnosis present

## 2015-03-28 DIAGNOSIS — R748 Abnormal levels of other serum enzymes: Secondary | ICD-10-CM | POA: Diagnosis not present

## 2015-03-28 DIAGNOSIS — J9601 Acute respiratory failure with hypoxia: Secondary | ICD-10-CM | POA: Insufficient documentation

## 2015-03-28 DIAGNOSIS — R6520 Severe sepsis without septic shock: Secondary | ICD-10-CM | POA: Diagnosis not present

## 2015-03-28 DIAGNOSIS — K830 Cholangitis: Secondary | ICD-10-CM | POA: Diagnosis not present

## 2015-03-28 DIAGNOSIS — K8033 Calculus of bile duct with acute cholangitis with obstruction: Secondary | ICD-10-CM | POA: Diagnosis present

## 2015-03-28 DIAGNOSIS — R7881 Bacteremia: Secondary | ICD-10-CM | POA: Diagnosis not present

## 2015-03-28 DIAGNOSIS — Z809 Family history of malignant neoplasm, unspecified: Secondary | ICD-10-CM

## 2015-03-28 DIAGNOSIS — R0602 Shortness of breath: Secondary | ICD-10-CM | POA: Diagnosis present

## 2015-03-28 DIAGNOSIS — R17 Unspecified jaundice: Secondary | ICD-10-CM

## 2015-03-28 DIAGNOSIS — N179 Acute kidney failure, unspecified: Secondary | ICD-10-CM | POA: Diagnosis not present

## 2015-03-28 DIAGNOSIS — D649 Anemia, unspecified: Secondary | ICD-10-CM | POA: Diagnosis present

## 2015-03-28 DIAGNOSIS — K8050 Calculus of bile duct without cholangitis or cholecystitis without obstruction: Secondary | ICD-10-CM

## 2015-03-28 DIAGNOSIS — R32 Unspecified urinary incontinence: Secondary | ICD-10-CM | POA: Diagnosis not present

## 2015-03-28 DIAGNOSIS — N472 Paraphimosis: Secondary | ICD-10-CM | POA: Diagnosis present

## 2015-03-28 DIAGNOSIS — E162 Hypoglycemia, unspecified: Secondary | ICD-10-CM | POA: Diagnosis present

## 2015-03-28 DIAGNOSIS — I10 Essential (primary) hypertension: Secondary | ICD-10-CM | POA: Diagnosis not present

## 2015-03-28 DIAGNOSIS — J9602 Acute respiratory failure with hypercapnia: Secondary | ICD-10-CM | POA: Diagnosis not present

## 2015-03-28 DIAGNOSIS — R1013 Epigastric pain: Secondary | ICD-10-CM | POA: Insufficient documentation

## 2015-03-28 DIAGNOSIS — E872 Acidosis: Secondary | ICD-10-CM | POA: Diagnosis present

## 2015-03-28 DIAGNOSIS — E785 Hyperlipidemia, unspecified: Secondary | ICD-10-CM | POA: Diagnosis present

## 2015-03-28 DIAGNOSIS — I482 Chronic atrial fibrillation: Secondary | ICD-10-CM | POA: Diagnosis present

## 2015-03-28 DIAGNOSIS — E876 Hypokalemia: Secondary | ICD-10-CM | POA: Diagnosis not present

## 2015-03-28 DIAGNOSIS — I129 Hypertensive chronic kidney disease with stage 1 through stage 4 chronic kidney disease, or unspecified chronic kidney disease: Secondary | ICD-10-CM | POA: Diagnosis present

## 2015-03-28 DIAGNOSIS — R740 Nonspecific elevation of levels of transaminase and lactic acid dehydrogenase [LDH]: Secondary | ICD-10-CM | POA: Diagnosis not present

## 2015-03-28 DIAGNOSIS — J189 Pneumonia, unspecified organism: Secondary | ICD-10-CM | POA: Diagnosis present

## 2015-03-28 DIAGNOSIS — R932 Abnormal findings on diagnostic imaging of liver and biliary tract: Secondary | ICD-10-CM | POA: Diagnosis not present

## 2015-03-28 DIAGNOSIS — E871 Hypo-osmolality and hyponatremia: Secondary | ICD-10-CM | POA: Diagnosis present

## 2015-03-28 DIAGNOSIS — Z96651 Presence of right artificial knee joint: Secondary | ICD-10-CM | POA: Diagnosis present

## 2015-03-28 DIAGNOSIS — K5900 Constipation, unspecified: Secondary | ICD-10-CM | POA: Diagnosis present

## 2015-03-28 DIAGNOSIS — R0902 Hypoxemia: Secondary | ICD-10-CM | POA: Insufficient documentation

## 2015-03-28 DIAGNOSIS — R945 Abnormal results of liver function studies: Secondary | ICD-10-CM | POA: Insufficient documentation

## 2015-03-28 DIAGNOSIS — Z888 Allergy status to other drugs, medicaments and biological substances status: Secondary | ICD-10-CM

## 2015-03-28 DIAGNOSIS — I2510 Atherosclerotic heart disease of native coronary artery without angina pectoris: Secondary | ICD-10-CM | POA: Diagnosis present

## 2015-03-28 DIAGNOSIS — Z4659 Encounter for fitting and adjustment of other gastrointestinal appliance and device: Secondary | ICD-10-CM

## 2015-03-28 DIAGNOSIS — E039 Hypothyroidism, unspecified: Secondary | ICD-10-CM | POA: Diagnosis present

## 2015-03-28 DIAGNOSIS — K7200 Acute and subacute hepatic failure without coma: Secondary | ICD-10-CM | POA: Diagnosis not present

## 2015-03-28 DIAGNOSIS — I483 Typical atrial flutter: Secondary | ICD-10-CM | POA: Diagnosis not present

## 2015-03-28 DIAGNOSIS — A4150 Gram-negative sepsis, unspecified: Secondary | ICD-10-CM | POA: Diagnosis not present

## 2015-03-28 DIAGNOSIS — K8309 Other cholangitis: Secondary | ICD-10-CM | POA: Insufficient documentation

## 2015-03-28 HISTORY — DX: Calculus of bile duct without cholangitis or cholecystitis without obstruction: K80.50

## 2015-03-28 HISTORY — DX: Chronic kidney disease, unspecified: N18.9

## 2015-03-28 LAB — COMPREHENSIVE METABOLIC PANEL
ALK PHOS: 118 U/L (ref 38–126)
ALT: 521 U/L — ABNORMAL HIGH (ref 17–63)
ANION GAP: 12 (ref 5–15)
AST: 454 U/L — AB (ref 15–41)
Albumin: 2.9 g/dL — ABNORMAL LOW (ref 3.5–5.0)
BILIRUBIN TOTAL: 6.1 mg/dL — AB (ref 0.3–1.2)
BUN: 31 mg/dL — AB (ref 6–20)
CO2: 20 mmol/L — ABNORMAL LOW (ref 22–32)
Calcium: 8.5 mg/dL — ABNORMAL LOW (ref 8.9–10.3)
Chloride: 97 mmol/L — ABNORMAL LOW (ref 101–111)
Creatinine, Ser: 2.32 mg/dL — ABNORMAL HIGH (ref 0.61–1.24)
GFR calc Af Amer: 28 mL/min — ABNORMAL LOW (ref >60)
GFR, EST NON AFRICAN AMERICAN: 24 mL/min — AB (ref >60)
Glucose, Bld: 132 mg/dL — ABNORMAL HIGH (ref 65–99)
Potassium: 3.5 mmol/L (ref 3.5–5.1)
SODIUM: 129 mmol/L — AB (ref 135–145)
Total Protein: 5.6 g/dL — ABNORMAL LOW (ref 6.5–8.1)

## 2015-03-28 LAB — CBC WITH DIFFERENTIAL/PLATELET
Basophils Absolute: 0 10*3/uL (ref 0.0–0.1)
Basophils Relative: 0 % (ref 0–1)
EOS PCT: 0 % (ref 0–5)
Eosinophils Absolute: 0 10*3/uL (ref 0.0–0.7)
HEMATOCRIT: 31.9 % — AB (ref 39.0–52.0)
Hemoglobin: 11.3 g/dL — ABNORMAL LOW (ref 13.0–17.0)
Lymphocytes Relative: 1 % — ABNORMAL LOW (ref 12–46)
Lymphs Abs: 0.2 10*3/uL — ABNORMAL LOW (ref 0.7–4.0)
MCH: 32 pg (ref 26.0–34.0)
MCHC: 35.4 g/dL (ref 30.0–36.0)
MCV: 90.4 fL (ref 78.0–100.0)
MONOS PCT: 5 % (ref 3–12)
Monocytes Absolute: 1.1 10*3/uL — ABNORMAL HIGH (ref 0.1–1.0)
Neutro Abs: 21.5 10*3/uL — ABNORMAL HIGH (ref 1.7–7.7)
Neutrophils Relative %: 94 % — ABNORMAL HIGH (ref 43–77)
PLATELETS: 152 10*3/uL (ref 150–400)
RBC: 3.53 MIL/uL — ABNORMAL LOW (ref 4.22–5.81)
RDW: 13.7 % (ref 11.5–15.5)
WBC Morphology: INCREASED
WBC: 22.8 10*3/uL — ABNORMAL HIGH (ref 4.0–10.5)

## 2015-03-28 LAB — PROTIME-INR
INR: 2.59 — AB (ref 0.00–1.49)
Prothrombin Time: 27.4 seconds — ABNORMAL HIGH (ref 11.6–15.2)

## 2015-03-28 LAB — CBG MONITORING, ED
GLUCOSE-CAPILLARY: 110 mg/dL — AB (ref 65–99)
Glucose-Capillary: 72 mg/dL (ref 65–99)
Glucose-Capillary: 45 mg/dL — ABNORMAL LOW (ref 65–99)
Glucose-Capillary: 161 mg/dL — ABNORMAL HIGH (ref 65–99)

## 2015-03-28 LAB — I-STAT CG4 LACTIC ACID, ED
LACTIC ACID, VENOUS: 4.56 mmol/L — AB (ref 0.5–2.0)
LACTIC ACID, VENOUS: 3.75 mmol/L — AB (ref 0.5–2.0)

## 2015-03-28 LAB — I-STAT TROPONIN, ED: TROPONIN I, POC: 0.03 ng/mL (ref 0.00–0.08)

## 2015-03-28 LAB — URINALYSIS, ROUTINE W REFLEX MICROSCOPIC
Glucose, UA: NEGATIVE mg/dL
Ketones, ur: 15 mg/dL — AB
Nitrite: POSITIVE — AB
PROTEIN: 30 mg/dL — AB
SPECIFIC GRAVITY, URINE: 1.014 (ref 1.005–1.030)
Urobilinogen, UA: 1 mg/dL (ref 0.0–1.0)
pH: 5.5 (ref 5.0–8.0)

## 2015-03-28 LAB — LACTIC ACID, PLASMA
LACTIC ACID, VENOUS: 3.9 mmol/L — AB (ref 0.5–2.0)
LACTIC ACID, VENOUS: 2.7 mmol/L — AB (ref 0.5–2.0)

## 2015-03-28 LAB — URINE MICROSCOPIC-ADD ON

## 2015-03-28 LAB — PROCALCITONIN: Procalcitonin: 42.82 ng/mL

## 2015-03-28 LAB — APTT: aPTT: 47 seconds — ABNORMAL HIGH (ref 24–37)

## 2015-03-28 LAB — LIPASE, BLOOD: Lipase: 12 U/L — ABNORMAL LOW (ref 22–51)

## 2015-03-28 LAB — POC OCCULT BLOOD, ED: Fecal Occult Bld: NEGATIVE

## 2015-03-28 LAB — TSH: TSH: 2.505 u[IU]/mL (ref 0.350–4.500)

## 2015-03-28 LAB — BRAIN NATRIURETIC PEPTIDE: B Natriuretic Peptide: 1040.3 pg/mL — ABNORMAL HIGH (ref 0.0–100.0)

## 2015-03-28 MED ORDER — LEVOFLOXACIN IN D5W 750 MG/150ML IV SOLN
750.0000 mg | Freq: Once | INTRAVENOUS | Status: AC
  Administered 2015-03-28: 750 mg via INTRAVENOUS
  Filled 2015-03-28: qty 150

## 2015-03-28 MED ORDER — IOHEXOL 300 MG/ML  SOLN
25.0000 mL | Freq: Once | INTRAMUSCULAR | Status: AC | PRN
  Administered 2015-03-28: 25 mL via ORAL

## 2015-03-28 MED ORDER — SODIUM CHLORIDE 0.9 % IV BOLUS (SEPSIS)
1000.0000 mL | Freq: Once | INTRAVENOUS | Status: AC
  Administered 2015-03-28: 1000 mL via INTRAVENOUS

## 2015-03-28 MED ORDER — DEXTROSE 5 % IV SOLN
1.0000 g | Freq: Three times a day (TID) | INTRAVENOUS | Status: DC
  Filled 2015-03-28 (×2): qty 1

## 2015-03-28 MED ORDER — SODIUM CHLORIDE 0.9 % IV BOLUS (SEPSIS)
500.0000 mL | INTRAVENOUS | Status: AC
  Administered 2015-03-28: 500 mL via INTRAVENOUS

## 2015-03-28 MED ORDER — SODIUM CHLORIDE 0.9 % IV BOLUS (SEPSIS)
1000.0000 mL | INTRAVENOUS | Status: AC
  Administered 2015-03-28 (×2): 1000 mL via INTRAVENOUS

## 2015-03-28 MED ORDER — METRONIDAZOLE IN NACL 5-0.79 MG/ML-% IV SOLN
500.0000 mg | Freq: Three times a day (TID) | INTRAVENOUS | Status: DC
  Administered 2015-03-28 – 2015-03-31 (×9): 500 mg via INTRAVENOUS
  Filled 2015-03-28 (×11): qty 100

## 2015-03-28 MED ORDER — LEVOFLOXACIN IN D5W 750 MG/150ML IV SOLN: 750.0000 mg | Freq: Once | INTRAVENOUS | Status: DC

## 2015-03-28 MED ORDER — SODIUM CHLORIDE 0.9 % IV SOLN
INTRAVENOUS | Status: DC
Start: 2015-03-28 — End: 2015-03-31
  Administered 2015-03-28 – 2015-03-30 (×4): via INTRAVENOUS

## 2015-03-28 MED ORDER — METRONIDAZOLE IN NACL 5-0.79 MG/ML-% IV SOLN: 500.0000 mg | Freq: Once | INTRAVENOUS | Status: DC

## 2015-03-28 MED ORDER — DEXTROSE 5 % IV SOLN
1.0000 g | Freq: Three times a day (TID) | INTRAVENOUS | Status: DC
  Administered 2015-03-28 – 2015-03-31 (×8): 1 g via INTRAVENOUS
  Filled 2015-03-28 (×11): qty 1

## 2015-03-28 MED ORDER — VANCOMYCIN HCL 10 G IV SOLR
1500.0000 mg | INTRAVENOUS | Status: AC
  Administered 2015-03-28: 1500 mg via INTRAVENOUS
  Filled 2015-03-28: qty 1500

## 2015-03-28 MED ORDER — DEXTROSE 50 % IV SOLN
1.0000 | Freq: Once | INTRAVENOUS | Status: AC
  Administered 2015-03-28: 50 mL via INTRAVENOUS

## 2015-03-28 MED ORDER — IOHEXOL 300 MG/ML  SOLN
25.0000 mL | INTRAMUSCULAR | Status: AC
Start: 2015-03-28 — End: 2015-03-28
  Administered 2015-03-28 (×2): 25 mL via ORAL

## 2015-03-28 MED ORDER — DEXTROSE 50 % IV SOLN
INTRAVENOUS | Status: AC
  Administered 2015-03-28: 50 mL via INTRAVENOUS
  Filled 2015-03-28: qty 50

## 2015-03-28 MED ORDER — APIXABAN 2.5 MG PO TABS
2.5000 mg | ORAL_TABLET | Freq: Two times a day (BID) | ORAL | Status: DC
  Administered 2015-03-29 (×2): 2.5 mg via ORAL
  Filled 2015-03-28 (×4): qty 1

## 2015-03-28 MED ORDER — VANCOMYCIN HCL IN DEXTROSE 750-5 MG/150ML-% IV SOLN: 750.0000 mg | INTRAVENOUS | Status: DC

## 2015-03-28 MED ORDER — LEVOTHYROXINE SODIUM 25 MCG PO TABS
25.0000 ug | ORAL_TABLET | Freq: Every day | ORAL | Status: DC
  Administered 2015-03-28 – 2015-04-08 (×10): 25 ug via ORAL
  Filled 2015-03-28 (×14): qty 1

## 2015-03-28 MED ORDER — PRAVASTATIN SODIUM 40 MG PO TABS
40.0000 mg | ORAL_TABLET | Freq: Every day | ORAL | Status: DC
  Administered 2015-03-28 – 2015-03-29 (×2): 40 mg via ORAL
  Filled 2015-03-28 (×3): qty 1

## 2015-03-28 MED ORDER — LEVOFLOXACIN IN D5W 500 MG/100ML IV SOLN: 500.0000 mg | INTRAVENOUS | Status: DC

## 2015-03-28 MED ORDER — SODIUM CHLORIDE 0.9 % IJ SOLN
3.0000 mL | Freq: Two times a day (BID) | INTRAMUSCULAR | Status: DC
  Administered 2015-03-29 – 2015-04-07 (×13): 3 mL via INTRAVENOUS

## 2015-03-28 MED ORDER — DEXTROSE 5 % IV SOLN
2.0000 g | Freq: Once | INTRAVENOUS | Status: AC
  Administered 2015-03-28: 2 g via INTRAVENOUS
  Filled 2015-03-28: qty 2

## 2015-03-28 MED ORDER — VANCOMYCIN HCL IN DEXTROSE 1-5 GM/200ML-% IV SOLN: 1000.0000 mg | Freq: Once | INTRAVENOUS | Status: DC

## 2015-03-28 MED ORDER — MORPHINE SULFATE 2 MG/ML IJ SOLN: 2.0000 mg | INTRAMUSCULAR | Status: DC | PRN

## 2015-03-28 MED ORDER — VANCOMYCIN HCL IN DEXTROSE 750-5 MG/150ML-% IV SOLN
750.0000 mg | INTRAVENOUS | Status: DC
  Filled 2015-03-28 (×2): qty 150

## 2015-03-28 NOTE — ED Notes (Signed)
Son's phone number [employer] [place]) [phone]

## 2015-03-28 NOTE — ED Notes (Signed)
Attempted x2 to page admitting MD about lactic acid results. No callback. Pt in NAD.

## 2015-03-28 NOTE — ED Notes (Signed)
3.9 Lactic Acid Critical lab value.

## 2015-03-28 NOTE — H&P (Addendum)
[facility] History and Physical  LAKSH HINNERS VZC:[id number] DOB: 09-Oct-1926 DOA: 03/28/2015  Referring physician: Emergency department PCP: [name], MD  Specialists:   Chief Complaint: Sepsis  HPI: [name] is a 81 y.o. male  With a hx of prostate cancer, HTN, arthritis, CAD who presented with progressively worsening epigastric abd pain and subjective fevers. In the ED, pt was found to have fevers as high as 101F with WBC of 22.8k, tachycardia, hypotension and lactate over 4. LFT's were also elevated also with bili of 6. Abd [place] with findings of contraction of GB with Guimaraes thickening and nodularity Pt's BP improved with IVF boluses. Pt was started on empiric broad spectrum abx, pan cultures obtained. Hospitalist consulted for consideration for admission.  Review of Systems:  Review of Systems  Constitutional: Positive for fever and malaise/fatigue.  HENT: Negative for ear discharge and tinnitus.   Eyes: Negative for pain and discharge.  Respiratory: Negative for sputum production and wheezing.   Cardiovascular: Negative for claudication.  Gastrointestinal: Positive for abdominal pain. Negative for heartburn.  Genitourinary: Negative for frequency and flank pain.  Musculoskeletal: Negative for falls and neck pain.  Neurological: Negative for tingling, tremors and loss of consciousness.  Psychiatric/Behavioral: Negative for memory loss and substance abuse.     Past Medical History  Diagnosis Date  . Prostate cancer   . GERD (gastroesophageal reflux disease)   . Hypertension   . Hyperlipemia   . Arthritis   . Inguinal hernia     left  . Coronary artery disease   . Dysrhythmia   . Shortness of breath   . Pneumonia   . H/O hiatal hernia    Past Surgical History  Procedure Laterality Date  . Hernia repair    . Prostate surgery    . Colonoscopy    . Coronary artery bypass graft N/A 09/03/2013    Procedure: CORONARY ARTERY BYPASS GRAFTING times four using left  internal mammary and bilateral saphenous vein.;  Surgeon: Ivin Poot, MD;  Location: [place];  Service: Open Heart Surgery;  Laterality: N/A;  . Intraoperative transesophageal echocardiogram N/A 09/03/2013    Procedure: INTRAOPERATIVE TRANSESOPHAGEAL ECHOCARDIOGRAM;  Surgeon: Ivin Poot, MD;  Location: [place];  Service: Open Heart Surgery;  Laterality: N/A;  . Coronary angioplasty    . Cataract extraction Bilateral   . Total knee arthroplasty Right 03/11/2014    Procedure: TOTAL KNEE ARTHROPLASTY;  Surgeon: Hessie Dibble, MD;  Location: [place];  Service: Orthopedics;  Laterality: Right;  . Left heart catheterization with coronary angiogram N/A 08/03/2013    Procedure: LEFT HEART CATHETERIZATION WITH CORONARY ANGIOGRAM;  Surgeon: Blane Ohara, MD;  Location[facility] C[facility] LAB;  Service: Cardiovascular;  Laterality: N/A;   Social History:  reports that he quit smoking about 36 years ago. His smoking use included Cigarettes. He smoked 1.00 pack per day. He has never used smokeless tobacco. He reports that he does not drink alcohol or use illicit drugs.  where does patient live--home, ALF, SNF? and with whom if at home?  Can patient participate in ADLs?  Allergies  Allergen Reactions  . Lipitor [Atorvastatin] Other (See Comments)    "feel bad"  . Penicillins Itching    Family History  Problem Relation Age of Onset  . Colon cancer Neg Hx   . Cancer Mother     bone marrow    (be sure to complete)  Prior to Admission medications   Medication Sig Start Date End Date  Taking? Authorizing Provider  amLODipine (NORVASC) 5 MG tablet Take 1 tablet (5 mg total) by mouth daily. 12/28/14  Yes Chipper Herb, MD  apixaban (ELIQUIS) 2.5 MG TABS tablet Take 1 tablet (2.5 mg total) by mouth 2 (two) times daily. 01/26/15  Yes Minus Breeding, MD  Cholecalciferol (VITAMIN D3) 1000 UNITS CAPS Take 1,000 Units by mouth daily.    Yes Historical Provider, MD  hydrochlorothiazide (HYDRODIURIL) 25 MG tablet  Take 0.5 tablets (12.5 mg total) by mouth daily. 12/28/14  Yes Chipper Herb, MD  levothyroxine (SYNTHROID, LEVOTHROID) 25 MCG tablet Take 1 tablet (25 mcg total) by mouth daily before breakfast. 12/28/14  Yes Chipper Herb, MD  lisinopril (PRINIVIL,ZESTRIL) 40 MG tablet Take 0.5 tablets (20 mg total) by mouth daily. 12/28/14  Yes Chipper Herb, MD  metoprolol succinate (TOPROL-XL) 50 MG 24 hr tablet Take 1 tablet (50 mg total) by mouth daily. 12/28/14  Yes Chipper Herb, MD  omeprazole (PRILOSEC) 40 MG capsule Take 1 capsule (40 mg total) by mouth daily. 12/28/14  Yes Chipper Herb, MD  oxybutynin (DITROPAN) 5 MG tablet Take 1 tablet (5 mg total) by mouth daily. 12/28/14  Yes Chipper Herb, MD  pramipexole (MIRAPEX) 1 MG tablet Take 1 tablet (1 mg total) by mouth at bedtime. 12/28/14  Yes Chipper Herb, MD  pravastatin (PRAVACHOL) 40 MG tablet Take 1 tablet (40 mg total) by mouth daily. 12/28/14  Yes Chipper Herb, MD  testosterone cypionate (DEPOTESTOTERONE CYPIONATE) 200 MG/ML injection Inject 300 mg into the muscle every 28 (twenty-eight) days.    Historical Provider, MD   Physical Exam: Filed Vitals:   03/28/15 1548 03/28/15 1600 03/28/15 1615 03/28/15 1650  BP: 107/85 110/67 108/66 139/68  Pulse: 74 72 71 80  Temp:      Resp: 23 26 18 22   Weight:      SpO2: 98% 96% 97% 100%     General:  Awake, in nad  Eyes: PERRL B  ENT: membranes moist, dentition fair  Neck: trachea midline, neck supple  Cardiovascular: regular, s1, s2  Respiratory: normal resp effort, no wheezing  Abdomen: soft, decreased BS, generally tender  Skin: normal skin turgor, no abnormal skin lesions seen  Musculoskeletal: perfused, no clubbing  Psychiatric: mood/affect normal// no auditory/visual hallucinations   Neurologic: cn2-12 grossly intact, strength/sensation intact  Labs on Admission:  Basic Metabolic Panel:  Recent Labs Lab 03/28/15 1205  NA 129*  K 3.5  CL 97*  CO2 20*  GLUCOSE 132*   BUN 31*  CREATININE 2.32*  CALCIUM 8.5*   Liver Function Tests:  Recent Labs Lab 03/28/15 1205  AST 454*  ALT 521*  ALKPHOS 118  BILITOT 6.1*  PROT 5.6*  ALBUMIN 2.9*    Recent Labs Lab 03/28/15 1205  LIPASE 12*   No results for input(s): AMMONIA in the last 168 hours. CBC:  Recent Labs Lab 03/28/15 1205  WBC 22.8*  NEUTROABS 21.5*  HGB 11.3*  HCT 31.9*  MCV 90.4  PLT 152   Cardiac Enzymes: No results for input(s): CKTOTAL, CKMB, CKMBINDEX, TROPONINI in the last 168 hours.  BNP (last 3 results)  Recent Labs  03/28/15 1200  BNP 1040.3*    ProBNP (last 3 results) No results for input(s): PROBNP in the last 8760 hours.  CBG:  Recent Labs Lab 03/28/15 1113  GLUCAP 110*    Radiological Exams on Admission: US Abdomen Complete  03/28/2015   CLINICAL DATA:  Elevated liver function studies,  elevated lactate level, epigastric pain  EXAM: ULTRASOUND ABDOMEN COMPLETE  COMPARISON:  Abdominal ultrasound of December 13, 2011  FINDINGS: Gallbladder: The gallbladder is contracted and its Zanella is irregular. Stones have been described in the past but discrete stones are not observed today.  Common bile duct: Diameter: 8.5 mm  Liver: The liver exhibits no focal mass nor ductal dilation. The surface contour is normal.  IVC: No abnormality visualized.  Pancreas: The pancreas was obscured by bowel gas.  Spleen: Size and appearance within normal limits.  Right Kidney: Length: 10.5 cm. Echogenicity within normal limits. No mass or hydronephrosis visualized.  Left Kidney: Length: 10.5 cm. Echogenicity within normal limits. No mass or hydronephrosis visualized.  Abdominal aorta: No aneurysm visualized.  Other findings: No ascites is demonstrated.  IMPRESSION: 1. Abnormal appearance of the gallbladder with contraction an Grether thickening and nodularity. Stones may be present but are not discretely demonstrated. 2. No acute abnormality of the liver, spleen, or kidneys. The pancreas was  obscured by bowel gas. 3. Given the patient's laboratory findings and chief complaint, further evaluation with abdominal and pelvic CT scanning would be useful.   Electronically Signed   By: David  Martinique M.D.   On: 03/28/2015 15:53   Dg Chest Portable 1 View  03/28/2015   CLINICAL DATA:  Shortness of breath today  EXAM: PORTABLE CHEST - 1 VIEW  COMPARISON:  03/27/2015  FINDINGS: Cardiac shadow is stable. A large hiatal hernia is again identified. Postsurgical changes are again seen. No focal infiltrate or sizable effusion is noted.  IMPRESSION: Stable large hiatal hernia.  No acute abnormality noted.   Electronically Signed   By: Inez Catalina M.D.   On: 03/28/2015 11:45    Assessment/Plan Principal Problem:   Sepsis Active Problems:   Hyperlipidemia   Osteoarthritis of right knee   Atrial flutter   1. Severe sepsis with shock from likely abd source 1. Fevers, tachycardia, lactate of over 4 2. Pt presented hypotensive, responded well to IVF hydration 3. Will cont on empiric levaquin, flagyl per [facility] protocol 4. Pt has already received doses of aztreonam and vanc 5. Follow pan cultures 6. Will check abd/pelvic CT 7. ED to discuss case with GI 8. Admit to stepdown 9. Follow serial lactate levels 2. HTN 1. Presented hypotensive 2. BP improved with IVF 3. HLD 1. Cont statin 4. Hx prostate cancer 1. Seems stable 5. ARF 1. Likely secondary to acute illness 2. Cont aggressive IVF as tolerated 6. DVT prophylaxis 1. On eliquis 7. Hypothyroid 1. Will check TSH 8. Elevated LFT's 1. Abd [place] with findings of GB Broady thickening 2. CT abd w/o contrast ordered, pending 3. Discussed with General Surgery who recommends consulting GI for ERCP 4. Will consult GI   Code Status: Full (must indicate code status--if unknown or must be presumed, indicate so) Family Communication: Pt in room (indicate person spoken with, if applicable, with phone number if by telephone) Disposition Plan: Admit  stepdown (indicate anticipated LOS)   Adelyn Roscher, [facility] Pager [phone]  If 7PM-7AM, please contact night-coverage [email or web address] Password [email or web address] 03/28/2015, 5:03 PM

## 2015-03-28 NOTE — ED Notes (Signed)
Pt returned from US

## 2015-03-28 NOTE — Progress Notes (Signed)
ANTIBIOTIC CONSULT NOTE - INITIAL  Pharmacy Consult for vancomycin + aztreonam + levaquin Indication: rule out sepsis  Allergies  Allergen Reactions  . Lipitor [Atorvastatin] Other (See Comments)    "feel bad"  . Penicillins Itching    Patient Measurements: Weight: 159 lb 2.8 oz (72.2 kg) Adjusted Body Weight:   Vital Signs: Temp: 97.3 F (36.3 C) (08/08 1050) BP: 83/67 mmHg (08/08 1345) Pulse Rate: 71 (08/08 1345) Intake/Output from previous day:   Intake/Output from this shift:    Labs:  Recent Labs  03/28/15 1205  WBC 22.8*  HGB 11.3*  PLT 152  CREATININE 2.32*   Estimated Creatinine Clearance: 23.3 mL/min (by C-G formula based on Cr of 2.32). No results for input(s): VANCOTROUGH, VANCOPEAK, VANCORANDOM, GENTTROUGH, GENTPEAK, GENTRANDOM, TOBRATROUGH, TOBRAPEAK, TOBRARND, AMIKACINPEAK, AMIKACINTROU, AMIKACIN in the last 72 hours.   Microbiology: Recent Results (from the past 720 hour(s))  Blood Culture (routine x 2)     Status: None (Preliminary result)   Collection Time: 03/28/15 12:54 PM  Result Value Ref Range Status   Specimen Description BLOOD RIGHT HAND  Final   Special Requests BOTTLES DRAWN AEROBIC AND ANAEROBIC 3CC  Final   Culture PENDING  Incomplete   Report Status PENDING  Incomplete  Urine culture     Status: None (Preliminary result)   Collection Time: 03/28/15  1:00 PM  Result Value Ref Range Status   Specimen Description URINE, CATHETERIZED  Final   Special Requests NONE  Final   Culture PENDING  Incomplete   Report Status PENDING  Incomplete    Medical History: Past Medical History  Diagnosis Date  . Prostate cancer   . GERD (gastroesophageal reflux disease)   . Hypertension   . Hyperlipemia   . Arthritis   . Inguinal hernia     left  . Coronary artery disease   . Dysrhythmia   . Shortness of breath   . Pneumonia   . H/O hiatal hernia     Medications:  Anti-infectives    Start     Dose/Rate Route Frequency Ordered Stop   03/30/15 1300  levofloxacin (LEVAQUIN) IVPB 500 mg     500 mg 100 mL/hr over 60 Minutes Intravenous Every 48 hours 03/28/15 1407     03/29/15 1500  vancomycin (VANCOCIN) IVPB 750 mg/150 ml premix     750 mg 150 mL/hr over 60 Minutes Intravenous Every 24 hours 03/28/15 1407     03/28/15 2100  aztreonam (AZACTAM) 1 g in dextrose 5 % 50 mL IVPB     1 g 100 mL/hr over 30 Minutes Intravenous Every 8 hours 03/28/15 1407     03/28/15 1245  vancomycin (VANCOCIN) 1,500 mg in sodium chloride 0.9 % 500 mL IVPB     1,500 mg 250 mL/hr over 120 Minutes Intravenous STAT 03/28/15 1235 03/29/15 1245   03/28/15 1230  levofloxacin (LEVAQUIN) IVPB 750 mg     750 mg 100 mL/hr over 90 Minutes Intravenous  Once 03/28/15 1228     03/28/15 1230  aztreonam (AZACTAM) 2 g in dextrose 5 % 50 mL IVPB     2 g 100 mL/hr over 30 Minutes Intravenous  Once 03/28/15 1228 03/28/15 1354   03/28/15 1230  vancomycin (VANCOCIN) IVPB 1000 mg/200 mL premix  Status:  Discontinued     1,000 mg 200 mL/hr over 60 Minutes Intravenous  Once 03/28/15 1228 03/28/15 1234     Assessment: 56 yom presented to the ED with generalized weakness and hypoglycemia. Pt is afebrile  but WBC is elevated at 22.8. Scr is elevated at 2.32 and lactic acid is also elevated at 4.56.  Levaquin 8/8>> Aztreo 8/8>> Vanc 8/8>>  Goal of Therapy:  Vancomycin trough level 15-20 mcg/ml  Plan:  - Vanc 1500mg  IV x 1 then 750mg  IV Q24H - Aztreonam 1gm IV Q8H - Levaquin 750mg  IV x 1 then 500mg  IV Q48H - F/u renal fxn, C&S, clinical status and trough at [place], Rande Lawman 03/28/2015,2:08 PM

## 2015-03-28 NOTE — ED Notes (Signed)
Pt aware of urine sample needed. Pt attempting to use urinal, pt unable to go at this time.

## 2015-03-28 NOTE — ED Notes (Signed)
Dr.Knapp notified patients fluids are done and VSS.

## 2015-03-28 NOTE — ED Notes (Signed)
Received pt from home with c/o generalized weakness and low blood sugar. Pt seen at [facility] urgent care for epigastric pain yesterday Pt CBG for ems 59 and initial BP 77/53. EMS gave pt 1 liter bolus of NSS and 1 amp of D50. CBG increased to 142 and BP increased to 90/53.

## 2015-03-28 NOTE — ED Notes (Signed)
CT notified that pt is done with contrast.

## 2015-03-28 NOTE — ED Notes (Signed)
Pt states hes having trouble breathing and taking a deep breath, lung sounds fine crackles in lower fields. Saturations 99%.

## 2015-03-28 NOTE — ED Notes (Signed)
Pt. Given food.

## 2015-03-28 NOTE — ED Notes (Signed)
Pt pt still 80/45, Dr. Acquanetta Belling notified. denies any symptoms, states he feels fine, denies dizziness, SOB, CP, headache. Pt denies pain.

## 2015-03-28 NOTE — ED Notes (Signed)
Pt ok to have ice chips per Dr Lavena Bullion

## 2015-03-28 NOTE — ED Notes (Signed)
NOTIFIED DR. V. NAGPAL FOR PATIENTS LAB RESULTS OF CG4+LACTIC ACID @12 :19PM.

## 2015-03-28 NOTE — ED Notes (Signed)
Pt's CBG checked and is Jeffers notified and Admitting Dr paged

## 2015-03-28 NOTE — ED Notes (Signed)
Dr. Tomi Bamberger notified of patients BP

## 2015-03-28 NOTE — ED Notes (Signed)
CHECKED CBG 72

## 2015-03-28 NOTE — ED Provider Notes (Signed)
CSN: [id number]     Arrival date & time 03/28/15  1047 History   First MD Initiated Contact with Patient 03/28/15 1048     Chief Complaint  Patient presents with  . Hypoglycemia  . Weakness     (Consider location/radiation/quality/duration/timing/severity/associated sxs/prior Treatment) Patient is a 81 y.o. male presenting with hypoglycemia and weakness. The history is provided by the patient, the EMS personnel and a relative (Brother).  Hypoglycemia Initial blood sugar:  59 by EMS Associated symptoms: weakness (General weakness. Starting today and worsening.)   Associated symptoms: no shortness of breath, no sweats and no vomiting   Weakness This is a new problem. The problem occurs constantly. The problem has been gradually worsening. Associated symptoms include abdominal pain (patient relates she has had epigastric pain more on the right side than the left over the last 2 days.) and weakness (General weakness. Starting today and worsening.). Pertinent negatives include no anorexia, chest pain, chills, congestion, coughing, diaphoresis, fever, headaches, nausea, neck pain, numbness, rash, urinary symptoms or vomiting. Nothing aggravates the symptoms. He has tried nothing for the symptoms.    Past Medical History  Diagnosis Date  . Prostate cancer   . GERD (gastroesophageal reflux disease)   . Hypertension   . Hyperlipemia   . Arthritis   . Inguinal hernia     left  . Coronary artery disease   . Dysrhythmia   . Shortness of breath   . Pneumonia   . H/O hiatal hernia    Past Surgical History  Procedure Laterality Date  . Hernia repair    . Prostate surgery    . Colonoscopy    . Coronary artery bypass graft N/A 09/03/2013    Procedure: CORONARY ARTERY BYPASS GRAFTING times four using left internal mammary and bilateral saphenous vein.;  Surgeon: Ivin Poot, MD;  Location: [place];  Service: Open Heart Surgery;  Laterality: N/A;  . Intraoperative transesophageal  echocardiogram N/A 09/03/2013    Procedure: INTRAOPERATIVE TRANSESOPHAGEAL ECHOCARDIOGRAM;  Surgeon: Ivin Poot, MD;  Location: [place];  Service: Open Heart Surgery;  Laterality: N/A;  . Coronary angioplasty    . Cataract extraction Bilateral   . Total knee arthroplasty Right 03/11/2014    Procedure: TOTAL KNEE ARTHROPLASTY;  Surgeon: Hessie Dibble, MD;  Location: [place];  Service: Orthopedics;  Laterality: Right;  . Left heart catheterization with coronary angiogram N/A 08/03/2013    Procedure: LEFT HEART CATHETERIZATION WITH CORONARY ANGIOGRAM;  Surgeon: Blane Ohara, MD;  Location: [facility] CATH LAB;  Service: Cardiovascular;  Laterality: N/A;   Family History  Problem Relation Age of Onset  . Colon cancer Neg Hx   . Cancer Mother     bone marrow   History  Substance Use Topics  . Smoking status: Former Smoker -- 1.00 packs/day    Types: Cigarettes    Quit date: 08/20/1978  . Smokeless tobacco: Never Used  . Alcohol Use: No    Review of Systems  Constitutional: Negative for fever, chills and diaphoresis.  HENT: Negative for congestion and rhinorrhea.   Eyes: Negative for photophobia and visual disturbance.  Respiratory: Negative for cough, shortness of breath and wheezing.   Cardiovascular: Negative for chest pain and palpitations.  Gastrointestinal: Positive for abdominal pain (patient relates she has had epigastric pain more on the right side than the left over the last 2 days.). Negative for nausea, vomiting and anorexia.  Genitourinary: Negative for dysuria and difficulty urinating.  Musculoskeletal: Negative for back pain and neck  pain.  Skin: Negative for pallor and rash.  Neurological: Positive for weakness (General weakness. Starting today and worsening.). Negative for light-headedness, numbness and headaches.  Psychiatric/Behavioral: Negative for confusion and agitation.  All other systems reviewed and are negative.     Allergies  Lipitor and  Penicillins  Home Medications   Prior to Admission medications   Medication Sig Start Date End Date Taking? Authorizing Provider  amLODipine (NORVASC) 5 MG tablet Take 1 tablet (5 mg total) by mouth daily. 12/28/14  Yes Chipper Herb, MD  apixaban (ELIQUIS) 2.5 MG TABS tablet Take 1 tablet (2.5 mg total) by mouth 2 (two) times daily. 01/26/15  Yes Minus Breeding, MD  Cholecalciferol (VITAMIN D3) 1000 UNITS CAPS Take 1,000 Units by mouth daily.    Yes Historical Provider, MD  hydrochlorothiazide (HYDRODIURIL) 25 MG tablet Take 0.5 tablets (12.5 mg total) by mouth daily. 12/28/14  Yes Chipper Herb, MD  levothyroxine (SYNTHROID, LEVOTHROID) 25 MCG tablet Take 1 tablet (25 mcg total) by mouth daily before breakfast. 12/28/14  Yes Chipper Herb, MD  lisinopril (PRINIVIL,ZESTRIL) 40 MG tablet Take 0.5 tablets (20 mg total) by mouth daily. 12/28/14  Yes Chipper Herb, MD  metoprolol succinate (TOPROL-XL) 50 MG 24 hr tablet Take 1 tablet (50 mg total) by mouth daily. 12/28/14  Yes Chipper Herb, MD  omeprazole (PRILOSEC) 40 MG capsule Take 1 capsule (40 mg total) by mouth daily. 12/28/14  Yes Chipper Herb, MD  oxybutynin (DITROPAN) 5 MG tablet Take 1 tablet (5 mg total) by mouth daily. 12/28/14  Yes Chipper Herb, MD  pramipexole (MIRAPEX) 1 MG tablet Take 1 tablet (1 mg total) by mouth at bedtime. 12/28/14  Yes Chipper Herb, MD  pravastatin (PRAVACHOL) 40 MG tablet Take 1 tablet (40 mg total) by mouth daily. 12/28/14  Yes Chipper Herb, MD  testosterone cypionate (DEPOTESTOTERONE CYPIONATE) 200 MG/ML injection Inject 300 mg into the muscle every 28 (twenty-eight) days.    Historical Provider, MD   BP 116/78 mmHg  Pulse 70  Temp(Src) 97.3 F (36.3 C)  Resp 21  Wt 159 lb 2.8 oz (72.2 kg)  SpO2 90% Physical Exam  Constitutional: He is oriented to person, place, and time. He appears distressed (mild).  Frail and elderly appearing  HENT:  Head: Normocephalic and atraumatic.  Eyes: Conjunctivae  and EOM are normal.  Neck: Normal range of motion. Neck supple.  Cardiovascular: Normal rate and regular rhythm.   Pulmonary/Chest: He is in respiratory distress (Mild). He has no wheezes. He has no rales.  Abdominal: Soft. There is no tenderness.  Genitourinary: Rectal exam shows anal tone abnormal.  Musculoskeletal: Normal range of motion. He exhibits no edema or tenderness.  Neurological: He is alert and oriented to person, place, and time.  Skin: Skin is warm and dry. He is not diaphoretic.  Psychiatric: He has a normal mood and affect. His behavior is normal.    ED Course  Procedures (including critical care time) Labs Review Labs Reviewed  CBC WITH DIFFERENTIAL/PLATELET - Abnormal; Notable for the following:    WBC 22.8 (*)    RBC 3.53 (*)    Hemoglobin 11.3 (*)    HCT 31.9 (*)    Neutrophils Relative % 94 (*)    Lymphocytes Relative 1 (*)    Neutro Abs 21.5 (*)    Lymphs Abs 0.2 (*)    Monocytes Absolute 1.1 (*)    All other components within normal limits  COMPREHENSIVE METABOLIC  PANEL - Abnormal; Notable for the following:    Sodium 129 (*)    Chloride 97 (*)    CO2 20 (*)    Glucose, Bld 132 (*)    BUN 31 (*)    Creatinine, Ser 2.32 (*)    Calcium 8.5 (*)    Total Protein 5.6 (*)    Albumin 2.9 (*)    AST 454 (*)    ALT 521 (*)    Total Bilirubin 6.1 (*)    GFR calc non Af Amer 24 (*)    GFR calc Af Amer 28 (*)    All other components within normal limits  LIPASE, BLOOD - Abnormal; Notable for the following:    Lipase 12 (*)    All other components within normal limits  URINALYSIS, ROUTINE W REFLEX MICROSCOPIC (NOT AT [facility]) - Abnormal; Notable for the following:    Color, Urine AMBER (*)    Hgb urine dipstick TRACE (*)    Bilirubin Urine MODERATE (*)    Ketones, ur 15 (*)    Protein, ur 30 (*)    Nitrite POSITIVE (*)    Leukocytes, UA TRACE (*)    All other components within normal limits  BRAIN NATRIURETIC PEPTIDE - Abnormal; Notable for the  following:    B Natriuretic Peptide 1040.3 (*)    All other components within normal limits  URINE MICROSCOPIC-ADD ON - Abnormal; Notable for the following:    Squamous Epithelial / LPF FEW (*)    Bacteria, UA FEW (*)    All other components within normal limits  LACTIC ACID, PLASMA - Abnormal; Notable for the following:    Lactic Acid, Venous 3.9 (*)    All other components within normal limits  LACTIC ACID, PLASMA - Abnormal; Notable for the following:    Lactic Acid, Venous 2.7 (*)    All other components within normal limits  PROTIME-INR - Abnormal; Notable for the following:    Prothrombin Time 27.4 (*)    INR 2.59 (*)    All other components within normal limits  APTT - Abnormal; Notable for the following:    aPTT 47 (*)    All other components within normal limits  CBG MONITORING, ED - Abnormal; Notable for the following:    Glucose-Capillary 110 (*)    All other components within normal limits  I-STAT CG4 LACTIC ACID, ED - Abnormal; Notable for the following:    Lactic Acid, Venous 4.56 (*)    All other components within normal limits  I-STAT CG4 LACTIC ACID, ED - Abnormal; Notable for the following:    Lactic Acid, Venous 3.75 (*)    All other components within normal limits  CBG MONITORING, ED - Abnormal; Notable for the following:    Glucose-Capillary 45 (*)    All other components within normal limits  CBG MONITORING, ED - Abnormal; Notable for the following:    Glucose-Capillary 161 (*)    All other components within normal limits  CULTURE, BLOOD (ROUTINE X 2)  CULTURE, BLOOD (ROUTINE X 2)  URINE CULTURE  PROCALCITONIN  TSH  COMPREHENSIVE METABOLIC PANEL  CBC  POC OCCULT BLOOD, ED  I-STAT TROPOININ, ED  I-STAT CG4 LACTIC ACID, ED  I-STAT CG4 LACTIC ACID, ED  CBG MONITORING, ED    Imaging Review Ct Abdomen Pelvis Wo Contrast  03/28/2015   CLINICAL DATA:  81 year old male with generalized weakness, low blood sugar and epigastric pain  EXAM: CT ABDOMEN AND  PELVIS WITHOUT CONTRAST  TECHNIQUE:  Multidetector CT imaging of the abdomen and pelvis was performed following the standard protocol without IV contrast.  COMPARISON:  Abdominal ultrasound performed earlier today  FINDINGS: Lower Chest: Massive hiatal hernia. Ingested oral contrast material lays within the partially intra thoracic stomach which is significantly dilated with gas. The heart is within normal limits for size. Atherosclerotic calcifications present throughout the visualized coronary arteries. No pericardial effusion. Chronic atelectasis in the bilateral lower lungs worse on the left than the right around the large hiatal hernia. Otherwise, the visualized lower lungs are essentially clear. Focal subpleural steatosis adjacent to the right middle lobe.  Abdomen: Unenhanced CT was performed per clinician order. Lack of IV contrast limits sensitivity and specificity, especially for evaluation of abdominal/pelvic solid viscera. Mild gaseous distension of the intra-abdominal portion of the stomach. Unremarkable CT appearance of the spleen, adrenal glands and pancreas. The gallbladder is contracted. There is a mild biliary ductal dilatation. A 6 x 8 mm high attenuation structure either within or immediately adjacent to the distal common bile duct. It is unclear if this represents a small periampullary duodenum diverticulum or choledocholithiasis.  The liver is otherwise normal in size and contour. No definite liver lesion given the limitations of absent IV contrast. Nonspecific perinephric stranding bilaterally. No hydronephrosis. No nephrolithiasis. No free fluid or suspicious adenopathy. No evidence of a bowel obstruction or focal bowel [place] thickening. Moderate volume of formed stool suggests constipation. Colonic diverticular disease without CT evidence of active inflammation.  Pelvis: The bladder is decompressed. Surgical changes of prior prostatectomy. Trace free fluid layers dependently within the pelvis.   Bones/Soft Tissues: No acute fracture or aggressive appearing lytic or blastic osseous lesion. Advanced multilevel degenerative disc disease. Small left fat containing inguinal hernia. Dextro convex scoliosis of the lumbar spine.  Vascular: Scattered atherosclerotic vascular calcifications without evidence of aneurysm. Otherwise, evaluation significantly limited in the absence of intravenous contrast.  IMPRESSION: 1. The patient's mid epigastric pain may be related to gaseous distension of the stomach including the large hiatal hernia and intra thoracic portion of the stomach. As the stomach is incompletely imaged, a component of acute or chronic gastric volvulus is difficult to exclude entirely although is clear there is not complete obstruction given passage of ingested oral contrast material into the distal bowel. 2. Small high attenuation structure in the region of the distal common bile duct may represent choledocholithiasis or a small periampullary duodenum diverticulum. A small duodenum diverticulum is favored. Recommend clinical correlation for evidence of a biliary obstruction or right upper quadrant pain. If clinically warranted, MRCP could further evaluate. 3. Nonspecific perinephric stranding bilaterally. 4. Colonic diverticular disease without CT evidence of active inflammation. 5. Small volume free fluid layers within the pelvis. This is abnormal in a male patient has and is likely secondary to an underlying infectious or inflammatory process. 6. Advanced multilevel degenerative disc disease with dextro convex scoliosis. 7. Scattered atherosclerotic vascular calcifications.   Electronically Signed   By: Jacqulynn Cadet M.D.   On: 03/28/2015 20:15   US Abdomen Complete  03/28/2015   CLINICAL DATA:  Elevated liver function studies, elevated lactate level, epigastric pain  EXAM: ULTRASOUND ABDOMEN COMPLETE  COMPARISON:  Abdominal ultrasound of December 13, 2011  FINDINGS: Gallbladder: The gallbladder is  contracted and its [place] is irregular. Stones have been described in the past but discrete stones are not observed today.  Common bile duct: Diameter: 8.5 mm  Liver: The liver exhibits no focal mass nor ductal dilation. The surface contour is  normal.  IVC: No abnormality visualized.  Pancreas: The pancreas was obscured by bowel gas.  Spleen: Size and appearance within normal limits.  Right Kidney: Length: 10.5 cm. Echogenicity within normal limits. No mass or hydronephrosis visualized.  Left Kidney: Length: 10.5 cm. Echogenicity within normal limits. No mass or hydronephrosis visualized.  Abdominal aorta: No aneurysm visualized.  Other findings: No ascites is demonstrated.  IMPRESSION: 1. Abnormal appearance of the gallbladder with contraction an [place] thickening and nodularity. Stones may be present but are not discretely demonstrated. 2. No acute abnormality of the liver, spleen, or kidneys. The pancreas was obscured by bowel gas. 3. Given the patient's laboratory findings and chief complaint, further evaluation with abdominal and pelvic CT scanning would be useful.   Electronically Signed   By: David  Martinique M.D.   On: 03/28/2015 15:53   Dg Chest Port 1 View  03/28/2015   CLINICAL DATA:  Shortness of Breath  EXAM: PORTABLE CHEST - 1 VIEW  COMPARISON:  Study obtained earlier in the day  FINDINGS: Large hiatal type hernia is again identified with most of the stomach above the diaphragm. The degree of inspiration shallow. There is no frank edema or consolidation. No appreciable joint effusions. Heart is mildly prominent with pulmonary vascularity within normal limits. Patient is status post coronary artery bypass grafting. No adenopathy.  IMPRESSION: Large hiatal type hernia. No edema or consolidation. Degree of inspiration shallow. No new opacity. No change in cardiac silhouette.   Electronically Signed   By: Lowella Grip III M.D.   On: 03/28/2015 18:36   Dg Chest Portable 1 View  03/28/2015   CLINICAL DATA:   Shortness of breath today  EXAM: PORTABLE CHEST - 1 VIEW  COMPARISON:  03/27/2015  FINDINGS: Cardiac shadow is stable. A large hiatal hernia is again identified. Postsurgical changes are again seen. No focal infiltrate or sizable effusion is noted.  IMPRESSION: Stable large hiatal hernia.  No acute abnormality noted.   Electronically Signed   By: Inez Catalina M.D.   On: 03/28/2015 11:45     EKG Interpretation   Date/Time:  Monday March 28 2015 10:54:08 EDT Ventricular Rate:  74 PR Interval:  261 QRS Duration: 97 QT Interval:  434 QTC Calculation: 481 R Axis:   -44 Text Interpretation:  Sinus rhythm Prolonged PR interval Left axis  deviation Borderline prolonged QT interval No significant change since  last tracing Confirmed by KNAPP  MD-J, JON ([id number]) on 03/28/2015 11:01:25 AM      MDM   Final diagnoses:  Shortness of breath   30 y M PMH CAD, prostate cancer presents today with generalized weakness, epigastric pain and hypoglycemia.   -Patient is notably hypotensive on arrival and appears pale. Concern for symptoms today is infectious etiology. Obtained CBC, CMP, Lipase, CBG, UA, Blood cultures X2, Lactic acid -30 cc/kg bolus ordered.  -Initial lactate is greater than 4, concerning for severe sepsis, especially coupled with hypotension. -Pt with good response to IVF and BP does improve. -CBC concerning for WBC of 22 and CMP is concerning for elevated LFT and T. Bili -Obtained abdominal ultrasound which showed findings that could be consistent with gallbladder [place] thickening, although definitive stones are not obvious. -Started on broad spectrum Abx. -Discussed with medicine who will plan on admitted the patient, relate they may want CT imaging later but at this point will hold off on it.  Discussed with GI over phone: May need hepatitis profile, possibly MRCP which may clarify. State medicine can consult  if they feel GI consultation is indicated.   Pt condition improving but  still critical at time of transfer to internal medicine.   Theodosia Quay, MD 03/29/15 [phone]

## 2015-03-28 NOTE — ED Provider Notes (Signed)
Pt presents to the ED with weakness.  Denies fevers or coughing.  No abdominal pain.  Some chest pain earlier but no pain now.  No blood in stool.  No melena. EMS found him to be hypotensive.  Given IV fluids. Physical Exam  BP 88/56 mmHg  Pulse 71  Temp(Src) 97.3 F (36.3 C)  Resp 18  Wt 159 lb 2.8 oz (72.2 kg)  SpO2 98%  Physical Exam  Constitutional: He appears well-developed and well-nourished. No distress.  HENT:  Head: Normocephalic and atraumatic.  Right Ear: External ear normal.  Left Ear: External ear normal.  Eyes: Conjunctivae are normal. Right eye exhibits no discharge. Left eye exhibits no discharge. No scleral icterus.  Neck: Neck supple. No tracheal deviation present.  Cardiovascular: Normal rate, regular rhythm and normal heart sounds.   Pulmonary/Chest: Effort normal and breath sounds normal. No stridor. No respiratory distress. He has no wheezes.  Abdominal: Soft. Bowel sounds are normal. He exhibits no distension and no pulsatile midline mass. There is no tenderness. There is no rebound and no guarding.  Musculoskeletal: He exhibits no edema.  Neurological: He is alert. Cranial nerve deficit: no gross deficits.  Skin: Skin is warm and dry. No rash noted.  Psychiatric: He has a normal mood and affect.  Nursing note and vitals reviewed.   ED Course  Procedures  EKG Interpretation  Date/Time:  Monday March 28 2015 10:54:08 EDT Ventricular Rate:  74 PR Interval:  261 QRS Duration: 97 QT Interval:  434 QTC Calculation: 481 R Axis:   -44 Text Interpretation:  Sinus rhythm Prolonged PR interval Left axis deviation Borderline prolonged QT interval No significant change since last tracing Confirmed by Jashaun Penrose  MD-J, Josselyne Onofrio ([id number]) on 03/28/2015 11:01:25 AM       MDM Pt is hypotensive.   Did not take his BP meds this am.  DDX includes sepsis, CV etiology, GI bleed.  Will plan on labs, CT abdomen and pelvis.  CXR.  Continue to monitor closely.  Elevated lfts and bili.   Concerning for possible choledocholithiasis, cholangitis, cholecystitis.  BP improved with treatment.  Plan on admission, will need gi consultation, possible surgical consultation.  Remains critically ill.  Will require close observation.     Dorie Rank, MD 03/29/15 [phone]

## 2015-03-28 NOTE — ED Notes (Signed)
RN gave Pt amp of D50 CBG checked just now and is registering at 161. Christina RN notified

## 2015-03-28 NOTE — ED Notes (Signed)
Lactic acid 2.7

## 2015-03-28 NOTE — ED Notes (Signed)
NOTIFIED  CATRNA ,PA-C FOR PATIENTS LAB RESULTS OF CG4+LACTIC ACID @16 :18 PM.

## 2015-03-28 NOTE — Consult Note (Signed)
Reason for Consult:elevated LFTs Referring Physician: [name] is an 81 y.o. male.  HPI: [name] developed epigastric abdominal pain about 3 days ago. He has had decreased appetite but no nausea or vomiting. He reports he was evaluated by an urgent care at [facility] but they did not know what was going on. He continued to feel badly and came to the [facility] emergency department. He was found to have signs of sepsis. Laboratory studies revealed elevated liver function tests and elevated white blood cell count. Ultrasound showed a contracted thickened gallbladder and common bile duct diameter was 8.5 mm. He also complains of some shortness of breath.  Past Medical History  Diagnosis Date  . Prostate cancer   . GERD (gastroesophageal reflux disease)   . Hypertension   . Hyperlipemia   . Arthritis   . Inguinal hernia     left  . Coronary artery disease   . Dysrhythmia   . Shortness of breath   . Pneumonia   . H/O hiatal hernia     Past Surgical History  Procedure Laterality Date  . Hernia repair    . Prostate surgery    . Colonoscopy    . Coronary artery bypass graft N/A 09/03/2013    Procedure: CORONARY ARTERY BYPASS GRAFTING times four using left internal mammary and bilateral saphenous vein.;  Surgeon: Ivin Poot, MD;  Location: [place];  Service: Open Heart Surgery;  Laterality: N/A;  . Intraoperative transesophageal echocardiogram N/A 09/03/2013    Procedure: INTRAOPERATIVE TRANSESOPHAGEAL ECHOCARDIOGRAM;  Surgeon: Ivin Poot, MD;  Location: [place];  Service: Open Heart Surgery;  Laterality: N/A;  . Coronary angioplasty    . Cataract extraction Bilateral   . Total knee arthroplasty Right 03/11/2014    Procedure: TOTAL KNEE ARTHROPLASTY;  Surgeon: Hessie Dibble, MD;  Location: [place];  Service: Orthopedics;  Laterality: Right;  . Left heart catheterization with coronary angiogram N/A 08/03/2013    Procedure: LEFT HEART CATHETERIZATION WITH CORONARY ANGIOGRAM;   Surgeon: Blane Ohara, MD;  Location: [facility] CATH LAB;  Service: Cardiovascular;  Laterality: N/A;    Family History  Problem Relation Age of Onset  . Colon cancer Neg Hx   . Cancer Mother     bone marrow    Social History:  reports that he quit smoking about 36 years ago. His smoking use included Cigarettes. He smoked 1.00 pack per day. He has never used smokeless tobacco. He reports that he does not drink alcohol or use illicit drugs.  Allergies:  Allergies  Allergen Reactions  . Lipitor [Atorvastatin] Other (See Comments)    "feel bad"  . Penicillins Itching    Medications: Prior to Admission:  (Not in a hospital admission)  Results for orders placed or performed during the hospital encounter of 03/28/15 (from the past 48 hour(s))  POC CBG, ED     Status: Abnormal   Collection Time: 03/28/15 11:13 AM  Result Value Ref Range   Glucose-Capillary 110 (H) 65 - 99 mg/dL  POC occult blood, ED Provider will collect     Status: None   Collection Time: 03/28/15 11:20 AM  Result Value Ref Range   Fecal Occult Bld NEGATIVE NEGATIVE  Brain natriuretic peptide     Status: Abnormal   Collection Time: 03/28/15 12:00 PM  Result Value Ref Range   B Natriuretic Peptide 1040.3 (H) 0.0 - 100.0 pg/mL  CBC with Differential     Status: Abnormal   Collection Time: 03/28/15  12:05 PM  Result Value Ref Range   WBC 22.8 (H) 4.0 - 10.5 K/uL   RBC 3.53 (L) 4.22 - 5.81 MIL/uL   Hemoglobin 11.3 (L) 13.0 - 17.0 g/dL   HCT 31.9 (L) 39.0 - 52.0 %   MCV 90.4 78.0 - 100.0 fL   MCH 32.0 26.0 - 34.0 pg   MCHC 35.4 30.0 - 36.0 g/dL   RDW 13.7 11.5 - 15.5 %   Platelets 152 150 - 400 K/uL   Neutrophils Relative % 94 (H) 43 - 77 %   Lymphocytes Relative 1 (L) 12 - 46 %   Monocytes Relative 5 3 - 12 %   Eosinophils Relative 0 0 - 5 %   Basophils Relative 0 0 - 1 %   Neutro Abs 21.5 (H) 1.7 - 7.7 K/uL   Lymphs Abs 0.2 (L) 0.7 - 4.0 K/uL   Monocytes Absolute 1.1 (H) 0.1 - 1.0 K/uL   Eosinophils  Absolute 0.0 0.0 - 0.7 K/uL   Basophils Absolute 0.0 0.0 - 0.1 K/uL   RBC Morphology BURR CELLS    WBC Morphology INCREASED BANDS (>20% BANDS)     Comment: MILD LEFT SHIFT (1-5% METAS, OCC MYELO, OCC BANDS) VACUOLATED NEUTROPHILS   Comprehensive metabolic panel     Status: Abnormal   Collection Time: 03/28/15 12:05 PM  Result Value Ref Range   Sodium 129 (L) 135 - 145 mmol/L   Potassium 3.5 3.5 - 5.1 mmol/L   Chloride 97 (L) 101 - 111 mmol/L   CO2 20 (L) 22 - 32 mmol/L   Glucose, Bld 132 (H) 65 - 99 mg/dL   BUN 31 (H) 6 - 20 mg/dL   Creatinine, Ser 2.32 (H) 0.61 - 1.24 mg/dL   Calcium 8.5 (L) 8.9 - 10.3 mg/dL   Total Protein 5.6 (L) 6.5 - 8.1 g/dL   Albumin 2.9 (L) 3.5 - 5.0 g/dL   AST 454 (H) 15 - 41 U/L   ALT 521 (H) 17 - 63 U/L   Alkaline Phosphatase 118 38 - 126 U/L   Total Bilirubin 6.1 (H) 0.3 - 1.2 mg/dL   GFR calc non Af Amer 24 (L) >60 mL/min   GFR calc Af Amer 28 (L) >60 mL/min    Comment: (NOTE) The eGFR has been calculated using the CKD EPI equation. This calculation has not been validated in all clinical situations. eGFR's persistently <60 mL/min signify possible Chronic Kidney Disease.    Anion gap 12 5 - 15  Lipase, blood     Status: Abnormal   Collection Time: 03/28/15 12:05 PM  Result Value Ref Range   Lipase 12 (L) 22 - 51 U/L  I-stat troponin, ED     Status: None   Collection Time: 03/28/15 12:06 PM  Result Value Ref Range   Troponin i, poc 0.03 0.00 - 0.08 ng/mL   Comment 3            Comment: Due to the release kinetics of cTnI, a negative result within the first hours of the onset of symptoms does not rule out myocardial infarction with certainty. If myocardial infarction is still suspected, repeat the test at appropriate intervals.   I-Stat CG4 Lactic Acid, ED     Status: Abnormal   Collection Time: 03/28/15 12:09 PM  Result Value Ref Range   Lactic Acid, Venous 4.56 (HH) 0.5 - 2.0 mmol/L  Blood Culture (routine x 2)     Status: None  (Preliminary result)   Collection Time: 03/28/15  12:54 PM  Result Value Ref Range   Specimen Description BLOOD RIGHT HAND    Special Requests BOTTLES DRAWN AEROBIC AND ANAEROBIC 3CC    Culture PENDING    Report Status PENDING   Urinalysis, Routine w reflex microscopic (not at [facility])     Status: Abnormal   Collection Time: 03/28/15  1:00 PM  Result Value Ref Range   Color, Urine AMBER (A) YELLOW    Comment: BIOCHEMICALS MAY BE AFFECTED BY COLOR   APPearance CLEAR CLEAR   Specific Gravity, Urine 1.014 1.005 - 1.030   pH 5.5 5.0 - 8.0   Glucose, UA NEGATIVE NEGATIVE mg/dL   Hgb urine dipstick TRACE (A) NEGATIVE   Bilirubin Urine MODERATE (A) NEGATIVE   Ketones, ur 15 (A) NEGATIVE mg/dL   Protein, ur 30 (A) NEGATIVE mg/dL   Urobilinogen, UA 1.0 0.0 - 1.0 mg/dL   Nitrite POSITIVE (A) NEGATIVE   Leukocytes, UA TRACE (A) NEGATIVE  Urine culture     Status: None (Preliminary result)   Collection Time: 03/28/15  1:00 PM  Result Value Ref Range   Specimen Description URINE, CATHETERIZED    Special Requests NONE    Culture PENDING    Report Status PENDING   Urine microscopic-add on     Status: Abnormal   Collection Time: 03/28/15  1:00 PM  Result Value Ref Range   Squamous Epithelial / LPF FEW (A) RARE   WBC, UA 7-10 <3 WBC/hpf   RBC / HPF 0-2 <3 RBC/hpf   Bacteria, UA FEW (A) RARE  I-Stat CG4 Lactic Acid, ED  (not at  [facility])     Status: Abnormal   Collection Time: 03/28/15  4:13 PM  Result Value Ref Range   Lactic Acid, Venous 3.75 (HH) 0.5 - 2.0 mmol/L   Comment NOTIFIED PHYSICIAN   CBG monitoring, ED     Status: None   Collection Time: 03/28/15  5:39 PM  Result Value Ref Range   Glucose-Capillary 72 65 - 99 mg/dL   Comment 1 Notify RN     US Abdomen Complete  03/28/2015   CLINICAL DATA:  Elevated liver function studies, elevated lactate level, epigastric pain  EXAM: ULTRASOUND ABDOMEN COMPLETE  COMPARISON:  Abdominal ultrasound of December 13, 2011  FINDINGS: Gallbladder: The  gallbladder is contracted and its Eisenhower is irregular. Stones have been described in the past but discrete stones are not observed today.  Common bile duct: Diameter: 8.5 mm  Liver: The liver exhibits no focal mass nor ductal dilation. The surface contour is normal.  IVC: No abnormality visualized.  Pancreas: The pancreas was obscured by bowel gas.  Spleen: Size and appearance within normal limits.  Right Kidney: Length: 10.5 cm. Echogenicity within normal limits. No mass or hydronephrosis visualized.  Left Kidney: Length: 10.5 cm. Echogenicity within normal limits. No mass or hydronephrosis visualized.  Abdominal aorta: No aneurysm visualized.  Other findings: No ascites is demonstrated.  IMPRESSION: 1. Abnormal appearance of the gallbladder with contraction an Mura thickening and nodularity. Stones may be present but are not discretely demonstrated. 2. No acute abnormality of the liver, spleen, or kidneys. The pancreas was obscured by bowel gas. 3. Given the patient's laboratory findings and chief complaint, further evaluation with abdominal and pelvic CT scanning would be useful.   Electronically Signed   By: David  Martinique M.D.   On: 03/28/2015 15:53   Dg Chest Portable 1 View  03/28/2015   CLINICAL DATA:  Shortness of breath today  EXAM: PORTABLE CHEST -  1 VIEW  COMPARISON:  03/27/2015  FINDINGS: Cardiac shadow is stable. A large hiatal hernia is again identified. Postsurgical changes are again seen. No focal infiltrate or sizable effusion is noted.  IMPRESSION: Stable large hiatal hernia.  No acute abnormality noted.   Electronically Signed   By: Inez Catalina M.D.   On: 03/28/2015 11:45    Review of Systems  Constitutional: Positive for malaise/fatigue.  HENT: Negative.   Eyes: Negative.   Respiratory: Positive for shortness of breath and wheezing.   Cardiovascular: Negative.   Gastrointestinal: Positive for abdominal pain. Negative for nausea and vomiting.  Genitourinary: Negative.    Musculoskeletal: Negative.   Skin: Negative.   Neurological: Negative.   Endo/Heme/Allergies: Negative.   Psychiatric/Behavioral: Negative.    Blood pressure 134/94, pulse 74, temperature 97.3 F (36.3 C), resp. rate 20, weight 72.2 kg (159 lb 2.8 oz), SpO2 98 %. Physical Exam  Constitutional: He is oriented to person, place, and time. He appears well-developed and well-nourished.  HENT:  Head: Normocephalic and atraumatic.  Right Ear: External ear normal.  Left Ear: External ear normal.  Nose: Nose normal.  Mouth/Throat: Oropharynx is clear and moist.  Eyes: Pupils are equal, round, and reactive to light.  Scleral icterus is faint  Neck: Normal range of motion. Neck supple. No tracheal deviation present.  Cardiovascular: Normal rate and normal heart sounds.   Decreased distal pulses  Respiratory: No stridor. He has wheezes. He has no rales. He exhibits no tenderness.  Moderately increased respiratory rate  GI: Soft. He exhibits no distension. There is tenderness. There is no rebound and no guarding.  Mild epigastric tenderness, no right upper quadrant tenderness, old lower midline scar  Musculoskeletal: Normal range of motion.  Neurological: He is alert and oriented to person, place, and time.  Skin: Skin is warm.  Psychiatric: He has a normal mood and affect.    Assessment/Plan: Epigastric pain and elevated LFTs with signs of sepsis consistent with cholangitis. Agree with CCM evaluation for resuscitation and IV antibiotics. Recommend GI consultation for consideration of ERCP. We will consider cholecystectomy prior to discharge. We will follow.  [name] E 03/28/2015, 6:12 PM

## 2015-03-28 NOTE — ED Notes (Signed)
Dr. Wyline Copas notified of patients respiratory status.

## 2015-03-28 NOTE — ED Notes (Signed)
Pt cbg 110

## 2015-03-28 NOTE — ED Notes (Signed)
Phlebotomy enroute to draw Lactic acid

## 2015-03-28 NOTE — Progress Notes (Signed)
ANTIBIOTIC CONSULT NOTE - INITIAL  Pharmacy Consult for vancomycin + aztreonam + levaquin Indication: rule out sepsis  Allergies  Allergen Reactions  . Lipitor [Atorvastatin] Other (See Comments)    "feel bad"  . Penicillins Itching    Patient Measurements: Weight: 159 lb 2.8 oz (72.2 kg) Adjusted Body Weight:   Vital Signs: Temp: 97.3 F (36.3 C) (08/08 1050) BP: 139/68 mmHg (08/08 1650) Pulse Rate: 80 (08/08 1650) Intake/Output from previous day:   Intake/Output from this shift:    Labs:  Recent Labs  03/28/15 1205  WBC 22.8*  HGB 11.3*  PLT 152  CREATININE 2.32*   Estimated Creatinine Clearance: 23.3 mL/min (by C-G formula based on Cr of 2.32). No results for input(s): VANCOTROUGH, VANCOPEAK, VANCORANDOM, GENTTROUGH, GENTPEAK, GENTRANDOM, TOBRATROUGH, TOBRAPEAK, TOBRARND, AMIKACINPEAK, AMIKACINTROU, AMIKACIN in the last 72 hours.   Microbiology: Recent Results (from the past 720 hour(s))  Blood Culture (routine x 2)     Status: None (Preliminary result)   Collection Time: 03/28/15 12:54 PM  Result Value Ref Range Status   Specimen Description BLOOD RIGHT HAND  Final   Special Requests BOTTLES DRAWN AEROBIC AND ANAEROBIC 3CC  Final   Culture PENDING  Incomplete   Report Status PENDING  Incomplete  Urine culture     Status: None (Preliminary result)   Collection Time: 03/28/15  1:00 PM  Result Value Ref Range Status   Specimen Description URINE, CATHETERIZED  Final   Special Requests NONE  Final   Culture PENDING  Incomplete   Report Status PENDING  Incomplete    Medical History: Past Medical History  Diagnosis Date  . Prostate cancer   . GERD (gastroesophageal reflux disease)   . Hypertension   . Hyperlipemia   . Arthritis   . Inguinal hernia     left  . Coronary artery disease   . Dysrhythmia   . Shortness of breath   . Pneumonia   . H/O hiatal hernia     Medications:  Anti-infectives    Start     Dose/Rate Route Frequency Ordered Stop   03/30/15 1300  levofloxacin (LEVAQUIN) IVPB 500 mg  Status:  Discontinued     500 mg 100 mL/hr over 60 Minutes Intravenous Every 48 hours 03/28/15 1407 03/28/15 1645   03/29/15 1500  vancomycin (VANCOCIN) IVPB 750 mg/150 ml premix  Status:  Discontinued     750 mg 150 mL/hr over 60 Minutes Intravenous Every 24 hours 03/28/15 1407 03/28/15 1645   03/29/15 1230  levofloxacin (LEVAQUIN) IVPB 750 mg     750 mg 100 mL/hr over 90 Minutes Intravenous  Once 03/28/15 1656     03/28/15 2100  aztreonam (AZACTAM) 1 g in dextrose 5 % 50 mL IVPB  Status:  Discontinued     1 g 100 mL/hr over 30 Minutes Intravenous Every 8 hours 03/28/15 1407 03/28/15 1645   03/28/15 1700  levofloxacin (LEVAQUIN) IVPB 750 mg  Status:  Discontinued     750 mg 100 mL/hr over 90 Minutes Intravenous  Once 03/28/15 1645 03/28/15 1656   03/28/15 1700  metroNIDAZOLE (FLAGYL) IVPB 500 mg  Status:  Discontinued     500 mg 100 mL/hr over 60 Minutes Intravenous  Once 03/28/15 1645 03/28/15 1658   03/28/15 1700  metroNIDAZOLE (FLAGYL) IVPB 500 mg     500 mg 100 mL/hr over 60 Minutes Intravenous Every 8 hours 03/28/15 1658     03/28/15 1245  vancomycin (VANCOCIN) 1,500 mg in sodium chloride 0.9 % 500 mL  IVPB     1,500 mg 250 mL/hr over 120 Minutes Intravenous STAT 03/28/15 1235 03/28/15 1649   03/28/15 1230  levofloxacin (LEVAQUIN) IVPB 750 mg     750 mg 100 mL/hr over 90 Minutes Intravenous  Once 03/28/15 1228 03/28/15 1440   03/28/15 1230  aztreonam (AZACTAM) 2 g in dextrose 5 % 50 mL IVPB     2 g 100 mL/hr over 30 Minutes Intravenous  Once 03/28/15 1228 03/28/15 1354   03/28/15 1230  vancomycin (VANCOCIN) IVPB 1000 mg/200 mL premix  Status:  Discontinued     1,000 mg 200 mL/hr over 60 Minutes Intravenous  Once 03/28/15 1228 03/28/15 1234     Assessment: 67 yom presented to the ED with generalized weakness and hypoglycemia. Pt is afebrile but WBC is elevated at 22.8. Scr is elevated at 2.32 and lactic acid is also elevated  at 4.56.  Flagyl added for intra-adominal infection.  Levaquin 8/8>> Aztreo 8/8>> Vanc 8/8>> Flagyl 8/8 >>  Goal of Therapy:  Vancomycin trough level 15-20 mcg/ml  Plan:  - Vanc 1500mg  IV x 1 then 750mg  IV Q24H - Aztreonam 1gm IV Q8H - Levaquin 750mg  IV x 1 then 500mg  IV Q48H - Flagyl 500 mg IV q8h - F/u renal fxn, C&S, clinical status and trough at [facility], PharmD Clinical Pharmacy Resident Pager # [phone] 03/28/2015 5:00 PM

## 2015-03-29 ENCOUNTER — Inpatient Hospital Stay (HOSPITAL_COMMUNITY): Payer: Medicare PPO

## 2015-03-29 ENCOUNTER — Encounter (HOSPITAL_COMMUNITY): Payer: Self-pay | Admitting: Physician Assistant

## 2015-03-29 DIAGNOSIS — J9601 Acute respiratory failure with hypoxia: Secondary | ICD-10-CM | POA: Insufficient documentation

## 2015-03-29 DIAGNOSIS — A419 Sepsis, unspecified organism: Secondary | ICD-10-CM | POA: Insufficient documentation

## 2015-03-29 DIAGNOSIS — R6520 Severe sepsis without septic shock: Secondary | ICD-10-CM

## 2015-03-29 DIAGNOSIS — R7881 Bacteremia: Secondary | ICD-10-CM | POA: Insufficient documentation

## 2015-03-29 DIAGNOSIS — R6521 Severe sepsis with septic shock: Secondary | ICD-10-CM

## 2015-03-29 DIAGNOSIS — K449 Diaphragmatic hernia without obstruction or gangrene: Secondary | ICD-10-CM | POA: Insufficient documentation

## 2015-03-29 DIAGNOSIS — R740 Nonspecific elevation of levels of transaminase and lactic acid dehydrogenase [LDH]: Secondary | ICD-10-CM

## 2015-03-29 DIAGNOSIS — N179 Acute kidney failure, unspecified: Secondary | ICD-10-CM

## 2015-03-29 DIAGNOSIS — R17 Unspecified jaundice: Secondary | ICD-10-CM

## 2015-03-29 LAB — BLOOD GAS, ARTERIAL
Acid-base deficit: 14.3 mmol/L — ABNORMAL HIGH (ref 0.0–2.0)
Acid-base deficit: 12.6 mmol/L — ABNORMAL HIGH (ref 0.0–2.0)
BICARBONATE: 16.1 meq/L — AB (ref 20.0–24.0)
Bicarbonate: 13.1 mEq/L — ABNORMAL LOW (ref 20.0–24.0)
DRAWN BY: 12971
Drawn by: 36529
FIO2: 1
FIO2: 100
LHR: 28 {breaths}/min
MECHVT: 600 mL
O2 Saturation: 91.2 %
O2 Saturation: 99.3 %
PEEP/CPAP: 5 cmH2O
PH ART: 7.261 — AB (ref 7.350–7.450)
Patient temperature: 98.6
Patient temperature: 98.6
TCO2: 18.5 mmol/L (ref 0–100)
TCO2: 14 mmol/L (ref 0–100)
pCO2 arterial: 78.3 mmHg (ref 35.0–45.0)
pCO2 arterial: 30.2 mmHg — ABNORMAL LOW (ref 35.0–45.0)
pH, Arterial: 6.944 — CL (ref 7.350–7.450)
pO2, Arterial: 87.7 mmHg (ref 80.0–100.0)
pO2, Arterial: 241 mmHg — ABNORMAL HIGH (ref 80.0–100.0)

## 2015-03-29 LAB — COMPREHENSIVE METABOLIC PANEL
ALK PHOS: 98 U/L (ref 38–126)
ALT: 352 U/L — ABNORMAL HIGH (ref 17–63)
ANION GAP: 10 (ref 5–15)
AST: 236 U/L — ABNORMAL HIGH (ref 15–41)
Albumin: 2.6 g/dL — ABNORMAL LOW (ref 3.5–5.0)
BILIRUBIN TOTAL: 6 mg/dL — AB (ref 0.3–1.2)
BUN: 37 mg/dL — AB (ref 6–20)
CO2: 17 mmol/L — ABNORMAL LOW (ref 22–32)
CREATININE: 2.08 mg/dL — AB (ref 0.61–1.24)
Calcium: 7.2 mg/dL — ABNORMAL LOW (ref 8.9–10.3)
Chloride: 101 mmol/L (ref 101–111)
GFR calc Af Amer: 32 mL/min — ABNORMAL LOW (ref >60)
GFR calc non Af Amer: 27 mL/min — ABNORMAL LOW (ref >60)
GLUCOSE: 56 mg/dL — AB (ref 65–99)
Potassium: 5.2 mmol/L — ABNORMAL HIGH (ref 3.5–5.1)
SODIUM: 128 mmol/L — AB (ref 135–145)
Total Protein: 5.2 g/dL — ABNORMAL LOW (ref 6.5–8.1)

## 2015-03-29 LAB — TROPONIN I: Troponin I: 0.19 ng/mL — ABNORMAL HIGH (ref <0.031)

## 2015-03-29 LAB — CBC
HCT: 31.4 % — ABNORMAL LOW (ref 39.0–52.0)
HCT: 32.2 % — ABNORMAL LOW (ref 39.0–52.0)
Hemoglobin: 10.8 g/dL — ABNORMAL LOW (ref 13.0–17.0)
Hemoglobin: 10.6 g/dL — ABNORMAL LOW (ref 13.0–17.0)
MCH: 31.8 pg (ref 26.0–34.0)
MCH: 30.7 pg (ref 26.0–34.0)
MCHC: 34.4 g/dL (ref 30.0–36.0)
MCHC: 32.9 g/dL (ref 30.0–36.0)
MCV: 92.4 fL (ref 78.0–100.0)
MCV: 93.3 fL (ref 78.0–100.0)
Platelets: 107 10*3/uL — ABNORMAL LOW (ref 150–400)
Platelets: 99 10*3/uL — ABNORMAL LOW (ref 150–400)
RBC: 3.4 MIL/uL — AB (ref 4.22–5.81)
RBC: 3.45 MIL/uL — AB (ref 4.22–5.81)
RDW: 14.1 % (ref 11.5–15.5)
RDW: 14.3 % (ref 11.5–15.5)
WBC: 20.6 10*3/uL — AB (ref 4.0–10.5)
WBC: 15.1 10*3/uL — AB (ref 4.0–10.5)

## 2015-03-29 LAB — GLUCOSE, CAPILLARY
GLUCOSE-CAPILLARY: 72 mg/dL (ref 65–99)
Glucose-Capillary: 78 mg/dL (ref 65–99)
Glucose-Capillary: 135 mg/dL — ABNORMAL HIGH (ref 65–99)

## 2015-03-29 LAB — BASIC METABOLIC PANEL
ANION GAP: 13 (ref 5–15)
BUN: 43 mg/dL — ABNORMAL HIGH (ref 6–20)
CALCIUM: 7.4 mg/dL — AB (ref 8.9–10.3)
CO2: 16 mmol/L — ABNORMAL LOW (ref 22–32)
Chloride: 102 mmol/L (ref 101–111)
Creatinine, Ser: 2.53 mg/dL — ABNORMAL HIGH (ref 0.61–1.24)
GFR calc Af Amer: 25 mL/min — ABNORMAL LOW (ref >60)
GFR calc non Af Amer: 21 mL/min — ABNORMAL LOW (ref >60)
Glucose, Bld: 73 mg/dL (ref 65–99)
Potassium: 4.5 mmol/L (ref 3.5–5.1)
SODIUM: 131 mmol/L — AB (ref 135–145)

## 2015-03-29 LAB — MRSA PCR SCREENING: MRSA by PCR: NEGATIVE

## 2015-03-29 LAB — LACTIC ACID, PLASMA: Lactic Acid, Venous: 2.9 mmol/L (ref 0.5–2.0)

## 2015-03-29 MED ORDER — CETYLPYRIDINIUM CHLORIDE 0.05 % MT LIQD
7.0000 mL | Freq: Two times a day (BID) | OROMUCOSAL | Status: DC
  Administered 2015-03-29 (×2): 7 mL via OROMUCOSAL

## 2015-03-29 MED ORDER — FENTANYL CITRATE (PF) 100 MCG/2ML IJ SOLN
50.0000 ug | INTRAMUSCULAR | Status: DC | PRN
  Administered 2015-03-29: 50 ug via INTRAVENOUS
  Filled 2015-03-29: qty 2

## 2015-03-29 MED ORDER — ETOMIDATE 2 MG/ML IV SOLN: 20.0000 mg | Freq: Once | INTRAVENOUS | Status: AC

## 2015-03-29 MED ORDER — CETYLPYRIDINIUM CHLORIDE 0.05 % MT LIQD
7.0000 mL | Freq: Two times a day (BID) | OROMUCOSAL | Status: DC
  Administered 2015-03-29 – 2015-03-31 (×5): 7 mL via OROMUCOSAL

## 2015-03-29 MED ORDER — ALBUTEROL SULFATE (2.5 MG/3ML) 0.083% IN NEBU: 2.5000 mg | INHALATION_SOLUTION | RESPIRATORY_TRACT | Status: DC | PRN

## 2015-03-29 MED ORDER — ROCURONIUM BROMIDE 50 MG/5ML IV SOLN
100.0000 mg | Freq: Once | INTRAVENOUS | Status: AC
  Filled 2015-03-29: qty 10

## 2015-03-29 MED ORDER — PANTOPRAZOLE SODIUM 40 MG IV SOLR
40.0000 mg | INTRAVENOUS | Status: DC
  Administered 2015-03-29 – 2015-03-30 (×2): 40 mg via INTRAVENOUS
  Filled 2015-03-29 (×3): qty 40

## 2015-03-29 MED ORDER — MIDAZOLAM HCL 2 MG/2ML IJ SOLN
0.5000 mg | INTRAMUSCULAR | Status: DC | PRN
  Administered 2015-03-29 – 2015-03-30 (×2): 1 mg via INTRAVENOUS
  Filled 2015-03-29 (×2): qty 2

## 2015-03-29 MED ORDER — CHLORHEXIDINE GLUCONATE 0.12 % MT SOLN
15.0000 mL | Freq: Two times a day (BID) | OROMUCOSAL | Status: DC
  Administered 2015-03-29 – 2015-03-30 (×3): 15 mL via OROMUCOSAL
  Filled 2015-03-29: qty 15

## 2015-03-29 NOTE — Consult Note (Signed)
[facility] Consult: 11:57 AM 03/29/2015  LOS: 1 day    Referring Provider: Dr Wyline Copas Primary Care Physician:  [name], MD Primary Gastroenterologist:  Dr. Deatra Ina    Reason for Consultation:  Abnormal LFTs and abdominal pain   HPI: [name] is a 81 y.o. male.  PMH of CAD, CABG 2014, PAF and flutter on Eliquis.  Prostate cancer status post prostatectomy., HTN.  As a child, the patient underwent thoracotomy for pneumonia.  01/2012 EGD.  For weight loss and GERD. 11 cm, sliding HH.   01/2012 Colonoscopy. Non-bleeding cecal AVMs (not ablated). Moderate sigmoid tics.  2003 Colonoscopy: descending and sigmoid tics.     Has been having epigastric pain for about 3 days. This is associated with anorexia, some nausea but no vomiting. Pain does not radiate into the back or chest He was seen at [facility] urgent care but no diagnosis was given to the patient.  He was sent home with a 5 count prescription for what is probably a narcotic, however he never got this filled. Symptoms continued and he presented last night to the common emergency room. Pain was worsened with deep breathing and he felt short of breath yesterday..  Fever measuring 101 F.  Tachycardic with hypotension and lactate over 4.  White blood cells to 22.8 Labs with sodium of 128. AKI.  AST/ALT 454/521. Total bilirubin 6.1. Alkaline phosphatase 118. Lipase 12. Non-contrastedCT abdomen and pelvis. Large, but non-obstructing, HH with intrathoracic stomach. Mild CBD dilatation with 6 x 8 mm structure either in on adjacent to CBD ( periampullary diverticulum vs choledocholithiasis?).  Liver normal.  Formed stool in colon.  Colon diverticulosis. Peri-nephric stranding. Pelvic free fluid. Scattered atherosclerosis. DDD/scoliosis.   CXR also confirms presence of a  large HH. ULtrasound abdomen: 8.1mm CBD.  GB contracted with irregular thickened and nodular [place].  Levaquin, vancomycin, azactam and Flagyl are concurrently in use. Last dose of Eliquis was at 66 today Dr. Grandville Silos, general surgeon has seen the patient and concurs with diagnosis of cholangitis. He will consider cholecystectomy prior to discharge.  Her general GI review of systems, the patient denies dysphagia, anorexia prior to the last few days. Stable bowel movements about every other day. No blood per rectum, no acholic or black stools.   Past Medical History  Diagnosis Date  . Prostate cancer   . GERD (gastroesophageal reflux disease)   . Hypertension   . Hyperlipemia   . Arthritis   . Inguinal hernia     left  . Coronary artery disease   . Dysrhythmia   . Shortness of breath   . Pneumonia   . H/O hiatal hernia     Past Surgical History  Procedure Laterality Date  . Hernia repair    . Prostate surgery    . Colonoscopy    . Coronary artery bypass graft N/A 09/03/2013    Procedure: CORONARY ARTERY BYPASS GRAFTING times four using left internal mammary and bilateral saphenous vein.;  Surgeon: Ivin Poot, MD;  Location: [facility];  Service: Open Heart Surgery;  Laterality: N/A;  . Intraoperative transesophageal echocardiogram N/A 09/03/2013    Procedure: INTRAOPERATIVE TRANSESOPHAGEAL ECHOCARDIOGRAM;  Surgeon: Ivin Poot, MD;  Location: [place];  Service: Open Heart Surgery;  Laterality: N/A;  . Coronary angioplasty    . Cataract extraction Bilateral   . Total knee arthroplasty Right 03/11/2014    Procedure: TOTAL KNEE ARTHROPLASTY;  Surgeon: Hessie Dibble, MD;  Location: [facility];  Service: Orthopedics;  Laterality: Right;  . Left heart catheterization with coronary angiogram N/A 08/03/2013    Procedure: LEFT HEART CATHETERIZATION WITH CORONARY ANGIOGRAM;  Surgeon: Blane Ohara, MD;  Location: [facility] CATH LAB;  Service: Cardiovascular;  Laterality: N/A;    Prior to  Admission medications   Medication Sig Start Date End Date Taking? Authorizing Provider  amLODipine (NORVASC) 5 MG tablet Take 1 tablet (5 mg total) by mouth daily. 12/28/14  Yes Chipper Herb, MD  apixaban (ELIQUIS) 2.5 MG TABS tablet Take 1 tablet (2.5 mg total) by mouth 2 (two) times daily. 01/26/15  Yes Minus Breeding, MD  Cholecalciferol (VITAMIN D3) 1000 UNITS CAPS Take 1,000 Units by mouth daily.    Yes Historical Provider, MD  hydrochlorothiazide (HYDRODIURIL) 25 MG tablet Take 0.5 tablets (12.5 mg total) by mouth daily. 12/28/14  Yes Chipper Herb, MD  levothyroxine (SYNTHROID, LEVOTHROID) 25 MCG tablet Take 1 tablet (25 mcg total) by mouth daily before breakfast. 12/28/14  Yes Chipper Herb, MD  lisinopril (PRINIVIL,ZESTRIL) 40 MG tablet Take 0.5 tablets (20 mg total) by mouth daily. 12/28/14  Yes Chipper Herb, MD  metoprolol succinate (TOPROL-XL) 50 MG 24 hr tablet Take 1 tablet (50 mg total) by mouth daily. 12/28/14  Yes Chipper Herb, MD  omeprazole (PRILOSEC) 40 MG capsule Take 1 capsule (40 mg total) by mouth daily. 12/28/14  Yes Chipper Herb, MD  oxybutynin (DITROPAN) 5 MG tablet Take 1 tablet (5 mg total) by mouth daily. 12/28/14  Yes Chipper Herb, MD  pramipexole (MIRAPEX) 1 MG tablet Take 1 tablet (1 mg total) by mouth at bedtime. 12/28/14  Yes Chipper Herb, MD  pravastatin (PRAVACHOL) 40 MG tablet Take 1 tablet (40 mg total) by mouth daily. 12/28/14  Yes Chipper Herb, MD  testosterone cypionate (DEPOTESTOTERONE CYPIONATE) 200 MG/ML injection Inject 300 mg into the muscle every 28 (twenty-eight) days.    Historical Provider, MD    Scheduled Meds: . antiseptic oral rinse  7 mL Mouth Rinse BID  . aztreonam  1 g Intravenous 3 times per day  . [START ON 03/30/2015] levofloxacin (LEVAQUIN) IV  500 mg Intravenous Q48H  . levothyroxine  25 mcg Oral QAC breakfast  . metronidazole  500 mg Intravenous Q8H  . pravastatin  40 mg Oral Daily  . sodium chloride  3 mL Intravenous Q12H  .  vancomycin  750 mg Intravenous Q24H   Infusions: . sodium chloride 150 mL/hr at 03/29/15 1145   PRN Meds: morphine injection   Allergies as of 03/28/2015 - Review Complete 03/28/2015  Allergen Reaction Noted  . Lipitor [atorvastatin] Other (See Comments) 01/15/2012  . Penicillins Itching 01/26/2015    Family History  Problem Relation Age of Onset  . Colon cancer Neg Hx   . Cancer Mother     bone marrow    History   Social History  . Marital Status: Widowed    Spouse Name: N/A  . Number of Children: 1  . Years of Education: N/A   Occupational History  . Retired  Social History Main Topics  . Smoking status: Former Smoker -- 1.00 packs/day    Types: Cigarettes    Quit date: 08/20/1978  . Smokeless tobacco: Never Used  . Alcohol Use: No  . Drug Use: No  . Sexual Activity: Not on file   Other Topics Concern  . Not on file   Social History Narrative   No caffeine     REVIEW OF SYSTEMS: Constitutional:  Generally the patient is pretty strong and vigorous. As recently as last week he was out using a chain saw to cut up wood ENT:  No nose bleeds Pulm:  Some issues with dyspnea on exertion if he climbs a hill or multiple stairs. Has occasional cough usually producing just clear/frothy sputum. Today he is coughing up yellow, purulent-appearing sputum. CV:  No palpitations, no LE edema. No chest pain GU:  No hematuria, no frequency GI:  Per HPI Heme:  No issues with unusual bleeding or excessive bruising.   Transfusions:  Got FFP and platelets in 08/2013 Neuro:  No headaches, no peripheral tingling or numbness Derm:  No itching, no rash or sores.  Endocrine:  No sweats or chills.  No polyuria or dysuria Immunization:  Not queried. Travel:  None beyond local counties in last few months.    PHYSICAL EXAM: Vital signs in last 24 hours: Filed Vitals:   03/29/15 0801  BP: 95/47  Pulse: 61  Temp: 97.7 F (36.5 C)  Resp: 19   Wt Readings from Last 3  Encounters:  03/29/15 171 lb 1.2 oz (77.6 kg)  02/28/15 159 lb 3.2 oz (72.213 kg)  01/26/15 163 lb (73.936 kg)   General: Pleasant, comfortable, somewhat frail appearing elderly WM.  does not look toxic. Head:  No facial edema. Other then his nose, the face is symmetric. No signs of head trauma.  Eyes:  Bilateral arcus senilis.  No scleral icterus. No conjunctival pallor. Ears:  Slightly HOH.  Nose:  No discharge or congestion Mouth:  Moist, clear MM.  Upper denture. Extensive fillings in the lower jaw. Significant plaque buildup. Neck:  No JVD, no TMG, no masses. Lungs:  Reduced bilaterally but much more so on the left. Crackles bilaterally but more so on the right. Some dyspnea with prolonged speaking.  Yellow, purulent sputum seen in at bedside. No cough during our conversation. There is a thoracotomy scar on the right, posterior, lower thoracic region. Heart: RRR. No MRG. NSR on monitor. Abdomen:  ND, NT.  Bowel sounds hypoactive. No HSM. No masses.  Lower midline scar consistent with his prostate surgery is well-healed.  Rectal: Deferred   Musc/Skeltl: TKR scar on right knee.No joint erythema or edema.  Extremities:  No CCE.  Neurologic:  Patient alert, oriented 3. His speech and mentation are a bit slow but accurate. Moves all 4 limbs, strength not tested. No asterixis or tremor.  Skin:  Hand. No sores or rashes. No obvious jaundice.  Tattoos:  None  Nodes:  No cervical or inguinal adenopathy.   Psych:  Calm, relaxed, cooperative.  Intake/Output from previous day: 08/08 0701 - 08/09 0700 In: 803 [I.V.:503; IV Piggyback:300] Out: 50 [Urine:50] Intake/Output this shift: Total I/O In: 50 [P.O.:50] Out: -   LAB RESULTS:  Recent Labs  03/28/15 1205 03/29/15 0550  WBC 22.8* 20.6*  HGB 11.3* 10.8*  HCT 31.9* 31.4*  PLT 152 107*   BMET Lab Results  Component Value Date   NA 128* 03/29/2015   NA 129* 03/28/2015   NA 138  12/28/2014   K 5.2* 03/29/2015   K 3.5  03/28/2015   K 4.7 12/28/2014   CL 101 03/29/2015   CL 97* 03/28/2015   CL 96* 12/28/2014   CO2 17* 03/29/2015   CO2 20* 03/28/2015   CO2 27 12/28/2014   GLUCOSE 56* 03/29/2015   GLUCOSE 132* 03/28/2015   GLUCOSE 84 12/28/2014   BUN 37* 03/29/2015   BUN 31* 03/28/2015   BUN 22 12/28/2014   CREATININE 2.08* 03/29/2015   CREATININE 2.32* 03/28/2015   CREATININE 1.30* 12/28/2014   CALCIUM 7.2* 03/29/2015   CALCIUM 8.5* 03/28/2015   CALCIUM 10.4* 12/28/2014   LFT  Recent Labs  03/28/15 1205 03/29/15 0550  PROT 5.6* 5.2*  ALBUMIN 2.9* 2.6*  AST 454* 236*  ALT 521* 352*  ALKPHOS 118 98  BILITOT 6.1* 6.0*   PT/INR Lab Results  Component Value Date   INR 2.59* 03/28/2015   INR 1.13 06/12/2014   INR 1.01 03/01/2014   Hepatitis Panel No results for input(s): HEPBSAG, HCVAB, HEPAIGM, HEPBIGM in the last 72 hours. C-Diff No components found for: CDIFF Lipase     Component Value Date/Time   LIPASE 12* 03/28/2015 1205    Drugs of Abuse  No results found for: LABOPIA, COCAINSCRNUR, LABBENZ, AMPHETMU, THCU, LABBARB   RADIOLOGY STUDIES: Ct Abdomen Pelvis Wo Contrast  03/28/2015   CLINICAL DATA:  81 year old male with generalized weakness, low blood sugar and epigastric pain  EXAM: CT ABDOMEN AND PELVIS WITHOUT CONTRAST  TECHNIQUE: Multidetector CT imaging of the abdomen and pelvis was performed following the standard protocol without IV contrast.  COMPARISON:  Abdominal ultrasound performed earlier today  FINDINGS: Lower Chest: Massive hiatal hernia. Ingested oral contrast material lays within the partially intra thoracic stomach which is significantly dilated with gas. The heart is within normal limits for size. Atherosclerotic calcifications present throughout the visualized coronary arteries. No pericardial effusion. Chronic atelectasis in the bilateral lower lungs worse on the left than the right around the large hiatal hernia. Otherwise, the visualized lower lungs are  essentially clear. Focal subpleural steatosis adjacent to the right middle lobe.  Abdomen: Unenhanced CT was performed per clinician order. Lack of IV contrast limits sensitivity and specificity, especially for evaluation of abdominal/pelvic solid viscera. Mild gaseous distension of the intra-abdominal portion of the stomach. Unremarkable CT appearance of the spleen, adrenal glands and pancreas. The gallbladder is contracted. There is a mild biliary ductal dilatation. A 6 x 8 mm high attenuation structure either within or immediately adjacent to the distal common bile duct. It is unclear if this represents a small periampullary duodenum diverticulum or choledocholithiasis.  The liver is otherwise normal in size and contour. No definite liver lesion given the limitations of absent IV contrast. Nonspecific perinephric stranding bilaterally. No hydronephrosis. No nephrolithiasis. No free fluid or suspicious adenopathy. No evidence of a bowel obstruction or focal bowel Pesci thickening. Moderate volume of formed stool suggests constipation. Colonic diverticular disease without CT evidence of active inflammation.  Pelvis: The bladder is decompressed. Surgical changes of prior prostatectomy. Trace free fluid layers dependently within the pelvis.  Bones/Soft Tissues: No acute fracture or aggressive appearing lytic or blastic osseous lesion. Advanced multilevel degenerative disc disease. Small left fat containing inguinal hernia. Dextro convex scoliosis of the lumbar spine.  Vascular: Scattered atherosclerotic vascular calcifications without evidence of aneurysm. Otherwise, evaluation significantly limited in the absence of intravenous contrast.  IMPRESSION: 1. The patient's mid epigastric pain may be related to gaseous distension of the stomach including  the large hiatal hernia and intra thoracic portion of the stomach. As the stomach is incompletely imaged, a component of acute or chronic gastric volvulus is difficult to  exclude entirely although is clear there is not complete obstruction given passage of ingested oral contrast material into the distal bowel. 2. Small high attenuation structure in the region of the distal common bile duct may represent choledocholithiasis or a small periampullary duodenum diverticulum. A small duodenum diverticulum is favored. Recommend clinical correlation for evidence of a biliary obstruction or right upper quadrant pain. If clinically warranted, MRCP could further evaluate. 3. Nonspecific perinephric stranding bilaterally. 4. Colonic diverticular disease without CT evidence of active inflammation. 5. Small volume free fluid layers within the pelvis. This is abnormal in a male patient has and is likely secondary to an underlying infectious or inflammatory process. 6. Advanced multilevel degenerative disc disease with dextro convex scoliosis. 7. Scattered atherosclerotic vascular calcifications.   Electronically Signed   By: Jacqulynn Cadet M.D.   On: 03/28/2015 20:15   US Abdomen Complete  03/28/2015   CLINICAL DATA:  Elevated liver function studies, elevated lactate level, epigastric pain  EXAM: ULTRASOUND ABDOMEN COMPLETE  COMPARISON:  Abdominal ultrasound of December 13, 2011  FINDINGS: Gallbladder: The gallbladder is contracted and its [place] is irregular. Stones have been described in the past but discrete stones are not observed today.  Common bile duct: Diameter: 8.5 mm  Liver: The liver exhibits no focal mass nor ductal dilation. The surface contour is normal.  IVC: No abnormality visualized.  Pancreas: The pancreas was obscured by bowel gas.  Spleen: Size and appearance within normal limits.  Right Kidney: Length: 10.5 cm. Echogenicity within normal limits. No mass or hydronephrosis visualized.  Left Kidney: Length: 10.5 cm. Echogenicity within normal limits. No mass or hydronephrosis visualized.  Abdominal aorta: No aneurysm visualized.  Other findings: No ascites is demonstrated.   IMPRESSION: 1. Abnormal appearance of the gallbladder with contraction an Jerry thickening and nodularity. Stones may be present but are not discretely demonstrated. 2. No acute abnormality of the liver, spleen, or kidneys. The pancreas was obscured by bowel gas. 3. Given the patient's laboratory findings and chief complaint, further evaluation with abdominal and pelvic CT scanning would be useful.   Electronically Signed   By: David  Martinique M.D.   On: 03/28/2015 15:53   Dg Chest Port 1 View  03/28/2015   CLINICAL DATA:  Shortness of Breath  EXAM: PORTABLE CHEST - 1 VIEW  COMPARISON:  Study obtained earlier in the day  FINDINGS: Large hiatal type hernia is again identified with most of the stomach above the diaphragm. The degree of inspiration shallow. There is no frank edema or consolidation. No appreciable joint effusions. Heart is mildly prominent with pulmonary vascularity within normal limits. Patient is status post coronary artery bypass grafting. No adenopathy.  IMPRESSION: Large hiatal type hernia. No edema or consolidation. Degree of inspiration shallow. No new opacity. No change in cardiac silhouette.   Electronically Signed   By: Lowella Grip III M.D.   On: 03/28/2015 18:36   Dg Chest Portable 1 View  03/28/2015   CLINICAL DATA:  Shortness of breath today  EXAM: PORTABLE CHEST - 1 VIEW  COMPARISON:  03/27/2015  FINDINGS: Cardiac shadow is stable. A large hiatal hernia is again identified. Postsurgical changes are again seen. No focal infiltrate or sizable effusion is noted.  IMPRESSION: Stable large hiatal hernia.  No acute abnormality noted.   Electronically Signed   By: Elta Gu[place]  Lukens M.D.   On: 03/28/2015 11:45    ENDOSCOPIC STUDIES: Per HPI  IMPRESSION:   *  Epigastric pain with markedly abnormal transaminases and total bilirubin.  Sepsis picture. Possible choledocholithiasis per CT imaging.  Likely cholangitis, on broad antibiotic coverage. No evidence of pancreatitis either by  noncontrast CT or ultrasound. LFTs are trending down in less than 24 hours.  *  Large hiatal hernia with intrathoracic component.  *  Coagulopathy.  Patient's Eliquis is currently on hold.  *  Normocytic anemia.  *  Acute thrombocytopenia.   PLAN:     *  Per Dr Carlean Purl. Repeat LFTs in AM.  ? If we should fine tune the abx regimen.  ? MRCP, tomorrow?   *  Ok to trial [facility] diet as ordered. But may need to stick to clears. Azucena Freed  03/29/2015, 11:57 AM Pager: [phone]     [facility] Attending  I have also seen and assessed the patient and agree with the advanced practitioner's assessment and plan. My Hx and PE same w/ additions...  He had acute respiratory failure and severe metabolic acidosis. Intubated and on vent.  NG tube placed by me - red jello fluid aspirated.  I reviewed CT and pxray images - he does not appear to have a CBD stone but contrast filled diverticulum in duodenum. Bile ducts do not appear that dilated - i am less suspicious of cholangitis. Large chronic hiatal hernia - walls of stomach not thickened as would suspect with ischemia.  Will do f/u abd gfilms re: NG tube placement ? If he has ischemia of upper gut or elsewhere in gut.  Will check [place] for better liver and bile duct imaging.  Will discuss w/ son when he arrives - flying from [place] and not available at present. EGD could be needed to assess further.

## 2015-03-29 NOTE — Progress Notes (Signed)
CRITICAL VALUE ALERT  Critical value received:  Lactic acid-2.9  Date of notification:  03/29/2015  Time of notification:  1500  Critical value read back:yes  Nurse who received alert:  Otho Najjar, RN  MD notified (1st page):  Dr. Lake Bells  Time of first page:  1501  Responding MD:  Dr. Lake Bells  Time MD responded:  1501

## 2015-03-29 NOTE — Progress Notes (Addendum)
   Discussed situation with son. Explained source of sepsis not yet clear. I think it could be gallbladder - he had an [place] on admit that I did not comment on with contracted GB - was difficult to tell more about it - did not suggest any biliary dilation. I had ordered US today and do think repeating it worthwhile to reassess gallbladder re: ? Stones and signs of cholecystitis possibly not seen on other exams.  Son reports his father was out sawing wood last week and has been active and very functional prior to admission.  Gatha Mayer, MD, [facility] [phone] (pager) 03/29/2015 10:24 PM

## 2015-03-29 NOTE — Consult Note (Addendum)
PULMONARY / CRITICAL CARE MEDICINE   Name: [name] MRN: [id number] DOB: [birth year]    ADMISSION DATE:  03/28/2015 CONSULTATION DATE:  03/29/15  REFERRING MD :  Dr. Wyline Copas  CHIEF COMPLAINT:  Acute Respiratory Failure  INITIAL PRESENTATION: 81 y/o M admitted on 8/8 with abdominal pain, severe sepsis. Developed respiratory failure and GNR bacteremia on 8/9 requiring intubation.  STUDIES:  8/08  [place] ABD >> abnormal appearance of the gallbladder with contraction, Hardwick thickening and nodularity, no discrete stones, no other acute abnormality.  CBD 8.5 mm 8/08  CT ABD/Pelvis >> gaseous distention of the stomach, large hiatal hernia, small high attenuation structure in the distal common bile duct (? Choledocholithiasis or small periampullary duodenum diverticulum), non-specific perinephric stranding, small volume free fluid within the pelvis   SIGNIFICANT EVENTS: 8/08  Admitted with abdominal pain, hypotension.     HISTORY OF PRESENT ILLNESS:  81 y/o M, former smoker, with PMH of GERD, inguinal / hiatal hernia, HTN, HLD, CAD s/p CABG, PAF/AF on Eliquis, CKD and prostate cancer who presented to [facility] on 8/8 with complaints of generalized weakness.  He had been seen at an urgent care the day prior for epigastric pain.    On EMS arrival to the home, the patient was found to be hypotensive and hypoglycemic which responded to IVF's and dextrose.  The patient reported of approximately 48 hours of epigastric pain, malaise and fevers.   Initial work up showed temp 101, WBC 22.8, tachycardia, hypotension, AST/ALT 454/521 / bilirubin 6.1, sr cr 2.32, Na 129 and a lactic acid of 4.56.  Initial CXR showed a large hiatal hernia but no edema or consolidation.  An ultrasound of the abdomen was obtained and revealed an abnormal appearance of the gallbladder with contraction, Cygan thickening and nodularity without discrete.  Follow up CT of the abdomen was assessed and showed an large, non-obstructing hiatal hernia with  intrathoracic stomach dilatation, colon diverticulosis, peri-nephric stranding and free fluid in the pelvis.  The patient was admitted per [facility].  He was also evaluated by GI and CCS who both feel findings are consistent with cholangitis and sepsis.  The patient was volume resuscitated, treated with abx (aztreonam/vanco in ER, then to levaquin / flagyl), pan cultured and monitored on SDU.  Unfortunately, the afternoon of 8/9, he deteriorated and required emergent intubation with transfer to ICU.  PCCM consulted for evaluation.      PAST MEDICAL HISTORY :   has a past medical history of Prostate cancer; GERD (gastroesophageal reflux disease); Hypertension; Hyperlipemia; Arthritis; Inguinal hernia; Coronary artery disease; Dysrhythmia; Shortness of breath; Pneumonia; H/O hiatal hernia; and CKD (chronic kidney disease) (1.2015).  has past surgical history that includes Hernia repair; Prostate surgery; Colonoscopy (2003, 01/2012); Coronary artery bypass graft (N/A, 09/03/2013); Intraoprative transesophageal echocardiogram (N/A, 09/03/2013); Coronary angioplasty; Cataract extraction (Bilateral); Total knee arthroplasty (Right, 03/11/2014); left heart catheterization with coronary angiogram (N/A, 08/03/2013); Right thoracotomy (As a young child, ? 34 years old); and Esophagogastroduodenoscopy (01/2012).   Prior to Admission medications   Medication Sig Start Date End Date Taking? Authorizing Provider  amLODipine (NORVASC) 5 MG tablet Take 1 tablet (5 mg total) by mouth daily. 12/28/14  Yes Chipper Herb, MD  apixaban (ELIQUIS) 2.5 MG TABS tablet Take 1 tablet (2.5 mg total) by mouth 2 (two) times daily. 01/26/15  Yes Minus Breeding, MD  Cholecalciferol (VITAMIN D3) 1000 UNITS CAPS Take 1,000 Units by mouth daily.    Yes Historical Provider, MD  hydrochlorothiazide (HYDRODIURIL) 25 MG tablet  Take 0.5 tablets (12.5 mg total) by mouth daily. 12/28/14  Yes Chipper Herb, MD  levothyroxine (SYNTHROID, LEVOTHROID) 25 MCG  tablet Take 1 tablet (25 mcg total) by mouth daily before breakfast. 12/28/14  Yes Chipper Herb, MD  lisinopril (PRINIVIL,ZESTRIL) 40 MG tablet Take 0.5 tablets (20 mg total) by mouth daily. 12/28/14  Yes Chipper Herb, MD  metoprolol succinate (TOPROL-XL) 50 MG 24 hr tablet Take 1 tablet (50 mg total) by mouth daily. 12/28/14  Yes Chipper Herb, MD  omeprazole (PRILOSEC) 40 MG capsule Take 1 capsule (40 mg total) by mouth daily. 12/28/14  Yes Chipper Herb, MD  oxybutynin (DITROPAN) 5 MG tablet Take 1 tablet (5 mg total) by mouth daily. 12/28/14  Yes Chipper Herb, MD  pramipexole (MIRAPEX) 1 MG tablet Take 1 tablet (1 mg total) by mouth at bedtime. 12/28/14  Yes Chipper Herb, MD  pravastatin (PRAVACHOL) 40 MG tablet Take 1 tablet (40 mg total) by mouth daily. 12/28/14  Yes Chipper Herb, MD  testosterone cypionate (DEPOTESTOTERONE CYPIONATE) 200 MG/ML injection Inject 300 mg into the muscle every 28 (twenty-eight) days.    Historical Provider, MD   Allergies  Allergen Reactions  . Lipitor [Atorvastatin] Other (See Comments)    "feel bad"  . Penicillins Itching    FAMILY HISTORY:  indicated that his mother is deceased. He indicated that his father is deceased.    SOCIAL HISTORY:  reports that he quit smoking about 36 years ago. His smoking use included Cigarettes. He smoked 1.00 pack per day. He has never used smokeless tobacco. He reports that he does not drink alcohol or use illicit drugs.  REVIEW OF SYSTEMS:  Unable to complete as patient is altered on mechanical ventilation.   SUBJECTIVE:   VITAL SIGNS: Temp:  [95.8 F (35.4 C)-97.9 F (36.6 C)] 95.8 F (35.4 C) (08/09 1242) Pulse Rate:  [45-118] 118 (08/09 1500) Resp:  [11-26] 22 (08/09 1500) BP: (79-163)/(46-99) 111/82 mmHg (08/09 1500) SpO2:  [90 %-100 %] 94 % (08/09 1500) FiO2 (%):  [100 %] 100 % (08/09 1453) Weight:  [171 lb 1.2 oz (77.6 kg)] 171 lb 1.2 oz (77.6 kg) (08/09 0200)   HEMODYNAMICS:     VENTILATOR  SETTINGS: Vent Mode:  [-] PRVC FiO2 (%):  [100 %] 100 % Set Rate:  [28 bmp] 28 bmp Vt Set:  [600 mL] 600 mL PEEP:  [5 cmH20] 5 cmH20 Plateau Pressure:  [21 cmH20] 21 cmH20   INTAKE / OUTPUT:  Intake/Output Summary (Last 24 hours) at 03/29/15 1508 Last data filed at 03/29/15 9892  Gross per 24 hour  Intake    853 ml  Output     50 ml  Net    803 ml    PHYSICAL EXAMINATION: General:  Elderly male on vent Neuro:  Sedate, no distress  HEENT:  MM pink/moist, OETT Cardiovascular:  s1s2 rrr, no m/r/g Lungs:  resp's even/non-labored, lungs bilaterally coarse, no wheeze Abdomen:  Obese, distended, bsx4 hypoactive  Musculoskeletal:  No acute deformities  Skin:  Warm/dry, mottled prior to intubation, improved / pink  LABS:  CBC  Recent Labs Lab 03/28/15 1205 03/29/15 0550  WBC 22.8* 20.6*  HGB 11.3* 10.8*  HCT 31.9* 31.4*  PLT 152 107*   Coag's  Recent Labs Lab 03/28/15 1834  APTT 47*  INR 2.59*   BMET  Recent Labs Lab 03/28/15 1205 03/29/15 0550  NA 129* 128*  K 3.5 5.2*  CL 97* 101  CO2 20* 17*  BUN 31* 37*  CREATININE 2.32* 2.08*  GLUCOSE 132* 56*   Electrolytes  Recent Labs Lab 03/28/15 1205 03/29/15 0550  CALCIUM 8.5* 7.2*   Sepsis Markers  Recent Labs Lab 03/28/15 1613 03/28/15 1834 03/28/15 2243  LATICACIDVEN 3.75* 3.9* 2.7*  PROCALCITON  --  42.82  --    ABG  Recent Labs Lab 03/29/15 1425  PHART 6.944*  PCO2ART 78.3*  PO2ART 87.7   Liver Enzymes  Recent Labs Lab 03/28/15 1205 03/29/15 0550  AST 454* 236*  ALT 521* 352*  ALKPHOS 118 98  BILITOT 6.1* 6.0*  ALBUMIN 2.9* 2.6*   Cardiac Enzymes No results for input(s): TROPONINI, PROBNP in the last 168 hours.   Glucose  Recent Labs Lab 03/28/15 1739 03/28/15 2107 03/28/15 2130 03/29/15 0259 03/29/15 0622 03/29/15 1424  GLUCAP 72 45* 161* 78 72 135*    Imaging Ct Abdomen Pelvis Wo Contrast  03/28/2015   CLINICAL DATA:  81 year old male with generalized  weakness, low blood sugar and epigastric pain  EXAM: CT ABDOMEN AND PELVIS WITHOUT CONTRAST  TECHNIQUE: Multidetector CT imaging of the abdomen and pelvis was performed following the standard protocol without IV contrast.  COMPARISON:  Abdominal ultrasound performed earlier today  FINDINGS: Lower Chest: Massive hiatal hernia. Ingested oral contrast material lays within the partially intra thoracic stomach which is significantly dilated with gas. The heart is within normal limits for size. Atherosclerotic calcifications present throughout the visualized coronary arteries. No pericardial effusion. Chronic atelectasis in the bilateral lower lungs worse on the left than the right around the large hiatal hernia. Otherwise, the visualized lower lungs are essentially clear. Focal subpleural steatosis adjacent to the right middle lobe.  Abdomen: Unenhanced CT was performed per clinician order. Lack of IV contrast limits sensitivity and specificity, especially for evaluation of abdominal/pelvic solid viscera. Mild gaseous distension of the intra-abdominal portion of the stomach. Unremarkable CT appearance of the spleen, adrenal glands and pancreas. The gallbladder is contracted. There is a mild biliary ductal dilatation. A 6 x 8 mm high attenuation structure either within or immediately adjacent to the distal common bile duct. It is unclear if this represents a small periampullary duodenum diverticulum or choledocholithiasis.  The liver is otherwise normal in size and contour. No definite liver lesion given the limitations of absent IV contrast. Nonspecific perinephric stranding bilaterally. No hydronephrosis. No nephrolithiasis. No free fluid or suspicious adenopathy. No evidence of a bowel obstruction or focal bowel Bree thickening. Moderate volume of formed stool suggests constipation. Colonic diverticular disease without CT evidence of active inflammation.  Pelvis: The bladder is decompressed. Surgical changes of prior  prostatectomy. Trace free fluid layers dependently within the pelvis.  Bones/Soft Tissues: No acute fracture or aggressive appearing lytic or blastic osseous lesion. Advanced multilevel degenerative disc disease. Small left fat containing inguinal hernia. Dextro convex scoliosis of the lumbar spine.  Vascular: Scattered atherosclerotic vascular calcifications without evidence of aneurysm. Otherwise, evaluation significantly limited in the absence of intravenous contrast.  IMPRESSION: 1. The patient's mid epigastric pain may be related to gaseous distension of the stomach including the large hiatal hernia and intra thoracic portion of the stomach. As the stomach is incompletely imaged, a component of acute or chronic gastric volvulus is difficult to exclude entirely although is clear there is not complete obstruction given passage of ingested oral contrast material into the distal bowel. 2. Small high attenuation structure in the region of the distal common bile duct may represent choledocholithiasis or a small  periampullary duodenum diverticulum. A small duodenum diverticulum is favored. Recommend clinical correlation for evidence of a biliary obstruction or right upper quadrant pain. If clinically warranted, MRCP could further evaluate. 3. Nonspecific perinephric stranding bilaterally. 4. Colonic diverticular disease without CT evidence of active inflammation. 5. Small volume free fluid layers within the pelvis. This is abnormal in a male patient has and is likely secondary to an underlying infectious or inflammatory process. 6. Advanced multilevel degenerative disc disease with dextro convex scoliosis. 7. Scattered atherosclerotic vascular calcifications.   Electronically Signed   By: Jacqulynn Cadet M.D.   On: 03/28/2015 20:15   US Abdomen Complete  03/28/2015   CLINICAL DATA:  Elevated liver function studies, elevated lactate level, epigastric pain  EXAM: ULTRASOUND ABDOMEN COMPLETE  COMPARISON:  Abdominal  ultrasound of December 13, 2011  FINDINGS: Gallbladder: The gallbladder is contracted and its Nunn is irregular. Stones have been described in the past but discrete stones are not observed today.  Common bile duct: Diameter: 8.5 mm  Liver: The liver exhibits no focal mass nor ductal dilation. The surface contour is normal.  IVC: No abnormality visualized.  Pancreas: The pancreas was obscured by bowel gas.  Spleen: Size and appearance within normal limits.  Right Kidney: Length: 10.5 cm. Echogenicity within normal limits. No mass or hydronephrosis visualized.  Left Kidney: Length: 10.5 cm. Echogenicity within normal limits. No mass or hydronephrosis visualized.  Abdominal aorta: No aneurysm visualized.  Other findings: No ascites is demonstrated.  IMPRESSION: 1. Abnormal appearance of the gallbladder with contraction an Marcello thickening and nodularity. Stones may be present but are not discretely demonstrated. 2. No acute abnormality of the liver, spleen, or kidneys. The pancreas was obscured by bowel gas. 3. Given the patient's laboratory findings and chief complaint, further evaluation with abdominal and pelvic CT scanning would be useful.   Electronically Signed   By: David  Martinique M.D.   On: 03/28/2015 15:53   Dg Chest Port 1 View  03/29/2015   CLINICAL DATA:  Intubation  EXAM: PORTABLE CHEST - 1 VIEW  COMPARISON:  03/28/2015  FINDINGS: Endotracheal tube just above the carina. Recommend withdrawal of 3 cm.  Hypoventilation. Elevated left hemidiaphragm with left lower lobe atelectasis unchanged. Mild right lower lobe atelectasis and small bilateral effusions.  Improved aeration of the lungs compared with earlier today  IMPRESSION: Endotracheal tube at the carina, recommend withdrawal 3 cm.  Improved aeration in the lung bases. Persistent bibasilar atelectasis has improved. Small bilateral pleural effusions.   Electronically Signed   By: Franchot Gallo M.D.   On: 03/29/2015 14:59   Dg Chest Port 1 View  03/28/2015    CLINICAL DATA:  Shortness of Breath  EXAM: PORTABLE CHEST - 1 VIEW  COMPARISON:  Study obtained earlier in the day  FINDINGS: Large hiatal type hernia is again identified with most of the stomach above the diaphragm. The degree of inspiration shallow. There is no frank edema or consolidation. No appreciable joint effusions. Heart is mildly prominent with pulmonary vascularity within normal limits. Patient is status post coronary artery bypass grafting. No adenopathy.  IMPRESSION: Large hiatal type hernia. No edema or consolidation. Degree of inspiration shallow. No new opacity. No change in cardiac silhouette.   Electronically Signed   By: Lowella Grip III M.D.   On: 03/28/2015 18:36     ASSESSMENT / PLAN:  PULMONARY OETT 8/9 >> A: Acute Hypercarbic Respiratory Failure  P:   Now intubation  PRVC 8cc/kg Wean PEEP / FiO2 for  sats > 92% Follow up ABG and CXR post intubation  PRN albuterol  Daily SBT / WUA   CARDIOVASCULAR CVL A:  Severe Sepsis - suspected cholangitis  PAF/AF - on Eliquis Hx HTN, CAD s/p CABG P:  ICU monitoring  NS @ 150 ml/hr See ID  Hold Eliquis Hold home norvasc, HCTZ, lisinopril, toprol Assess ECHO  Repeat troponin   RENAL A:   Metabolic / Lactic Acidosis - in setting of sepsis  Acute on Chronic CKD - baseline sr cr ~ 1.3  P:   Trend BMP / UOP  Replace electrolytes as indicated  Trend lactic acid, repeat now   GASTROINTESTINAL A:   Large Hiatal Hernia  ? Cholangitis  P:   PPI for SUP NPO GI / CCS Following  ? EGD vs MRCP  Trend LFT's   HEMATOLOGIC A:   Anemia  Thrombocytopenia - in setting of severe sepsis  Elevated PT P:  Trend CBC  SCD's for DVT Monitor PT Hold Eliquis for now until decisions regarding further interventions  INFECTIOUS A:   Severe Sepsis / GNR Bacteremia - in setting of suspected abdominal source infection, ? Cholangitis  P:   BCx2 8/8 >> GNR 2/2 >>  UC 8/8 >>  Sputum 8/9 >>   Levaquin, start date 8/8,  day 2//2, d/c 8/9 Flagyl, start date 8/8, day 2/x Azactam, start date 8/8, day 2/x Vanco, start date 8/9, day 1/x  ENDOCRINE A:   Hypoglycemia  P:   CBG's Q4 while NPO May need dextrose added to maintenance IVF  NEUROLOGIC A:   Acute Metabolic Encephalopathy - in setting of severe sepsis P:   RASS goal: -1 Fentanyl PRN for pain Versed PRN for sedation    FAMILY  - Updates:  Attempted to call Son, unable to reach via phone.  Sister updated at bedside.    Letcher Schweikert - son, [phone]  - Inter-disciplinary family meet or Palliative Care meeting due by:  8/16.      Noe Gens, NP-C [facility] Pgr: [phone] or if no answer [phone] 03/29/2015, 3:08 PM    Attending:  I have seen and examined the patient with nurse practitioner/resident and agree with the note above.   Mr. Matney was admitted last night after several days of epigastric pain, found to have elevated LFTs, large hiatal hernia, and felt to have cholangitis on admission.  He received antibiotics and IVF resuscitation overnight but developed worsening respiratory failure on 8/9 and PCCM was consulted.  On exam: he was cyanotic, in marked respiratory distress  CT images personally reviewed> very large hiatal hernia, contrast in duodenum, atelectasis left base due to large hiatal hernia, some peritoneal fluid  LFTs show elevated AST/ALT and bili (unfractionated)  Impression/Plan: Acute respiratory failure with hypercarbia> uncertain etiology > unable to keep up with metabolic acidosis? No clear oxygenation problem, not wheezing; intubated, full vent support, VAP bundle, repeat ABG, maingtain high minute vent strategy for now given acidosis Metabolic acidosis> presumably due to sepsis and renal failure, still has some metabolic acidosis, but lactic acid was improving; needs repeat BMET and lactic acid now to help sort out if still has metabolic acidosis Severe Sepsis> abdominal source with GNR  bacteremia, picture worrisome for cholangitis but he does not clearly have an obstructing stone, GI has seen, ddx includes gastric volvulus, plan repeat RUQ U/S, NPO, endoscopy minimum to evaluate gastric volvulus if family consents (son on way now from [place]). AKI> improving, repeat BMET  My cc  time 90 minutes  Roselie Awkward, MD [facility] Pager: [phone] Cell: [phone] After 3pm or if no response, call [phone]

## 2015-03-29 NOTE — Progress Notes (Signed)
Utilization Review Completed.  

## 2015-03-29 NOTE — Progress Notes (Signed)
[facility] PROGRESS NOTE  DEAVIN FORST PPI:[id number] DOB: [birth year] DOA: 03/28/2015 PCP: [name], MD  Assessment/Plan: 1. Severe sepsis with shock from likely abd source with gm neg bacteremia 1. Pt with presenting fevers, tachycardia, lactate of over 4 2. Pt presented hypotensive, initially responded well to IVF hydration 3. Pt has been cont on empiric levaquin, flagyl per [facility] protocol 4. Pt receive one dose of aztreonam and vanc in the ED 5. Blood cultures have grown 2/2 gm neg organisms 6. Abd/pelvic CT with findings of possible gallstones with severe hiatal hernia. Ultimately when hopefully better, Surgery recs for cholecystectomy prior to d/c. GI has recommended possible MRCP tomorow 7. Lactate had gradually improved overnight with aggressive IVF 8. On the afternoon of 8/9, pt noted to be markedly sob with increased WOB. STAT CXR, ABG, repeat lactate ordered. Have discussed with Critical Care who is in room planning to intubate patient for airway protection 2. HTN 1. Presented hypotensive, in shock 2. BP improved with IVF 3. HLD 1. Cont statin 4. Hx prostate cancer 1. Seems stable 5. ARF 1. Likely secondary to acute illness/severe sepsis 2. Cont aggressive IVF as tolerated 3. Renal function slightly improved 6. DVT prophylaxis 1. On eliquis 7. Hypothyroid 1. TSH normal 8. Elevated LFT's 1. Abd [place] with findings of GB Lye thickening 2. CT abd w/o contrast with findings suggestive of possible gallstone 3. Discussed with General Surgery who recommends consulting GI for ERCP 4. Will consult GI  Code Status: Full, confirmed with patient and son, Sheamus Hasting Family Communication: Raylene Miyamoto (son), [phone] Disposition Plan: Transfer to ICU   Consultants:  Critical Care  GI   General Surgery  Procedures:  Intubation 8/9  Antibiotics:  Levaquin 8/8>>>  Flagyl 8/8>>>  HPI/Subjective: This AM reported feeling better and "I feel  stronger today." In afternoon, received call that pt is much more sob and dyspneic.   Objective: Filed Vitals:   03/29/15 0436 03/29/15 0616 03/29/15 0801 03/29/15 1242  BP: 87/50 111/59 95/47 106/70  Pulse:   61 68  Temp:   97.7 F (36.5 C) 95.8 F (35.4 C)  TempSrc:   Oral Axillary  Resp: 13 11 19 21   Height:      Weight:      SpO2:   97% 98%    Intake/Output Summary (Last 24 hours) at 03/29/15 1448 Last data filed at 03/29/15 0928  Gross per 24 hour  Intake    853 ml  Output     50 ml  Net    803 ml   Filed Weights   03/28/15 1050 03/29/15 0200  Weight: 72.2 kg (159 lb 2.8 oz) 77.6 kg (171 lb 1.2 oz)    Exam:   General:  Awake, in respiratory distress  Cardiovascular: tachycardic,s1, s2  Respiratory: increased resp effort, coarse BS  Abdomen: obese, pos BS  Musculoskeletal: appears cyanotic, no clubbing   Data Reviewed: Basic Metabolic Panel:  Recent Labs Lab 03/28/15 1205 03/29/15 0550  NA 129* 128*  K 3.5 5.2*  CL 97* 101  CO2 20* 17*  GLUCOSE 132* 56*  BUN 31* 37*  CREATININE 2.32* 2.08*  CALCIUM 8.5* 7.2*   Liver Function Tests:  Recent Labs Lab 03/28/15 1205 03/29/15 0550  AST 454* 236*  ALT 521* 352*  ALKPHOS 118 98  BILITOT 6.1* 6.0*  PROT 5.6* 5.2*  ALBUMIN 2.9* 2.6*    Recent Labs Lab 03/28/15 1205  LIPASE 12*   No results for input(s): AMMONIA in the  last 168 hours. CBC:  Recent Labs Lab 03/28/15 1205 03/29/15 0550  WBC 22.8* 20.6*  NEUTROABS 21.5*  --   HGB 11.3* 10.8*  HCT 31.9* 31.4*  MCV 90.4 92.4  PLT 152 107*   Cardiac Enzymes: No results for input(s): CKTOTAL, CKMB, CKMBINDEX, TROPONINI in the last 168 hours. BNP (last 3 results)  Recent Labs  03/28/15 1200  BNP 1040.3*    ProBNP (last 3 results) No results for input(s): PROBNP in the last 8760 hours.  CBG:  Recent Labs Lab 03/28/15 1739 03/28/15 2107 03/28/15 2130 03/29/15 0259 03/29/15 0622  GLUCAP 72 45* 161* 78 72    Recent  Results (from the past 240 hour(s))  Blood Culture (routine x 2)     Status: None (Preliminary result)   Collection Time: 03/28/15 12:40 PM  Result Value Ref Range Status   Specimen Description BLOOD RIGHT ANTECUBITAL  Final   Special Requests BOTTLES DRAWN AEROBIC AND ANAEROBIC 5CC  Final   Culture  Setup Time   Final    GRAM NEGATIVE COCCOBACILLI IN BOTH AEROBIC AND ANAEROBIC BOTTLES CRITICAL RESULT CALLED TO, READ BACK BY AND VERIFIED WITH: C GOSS RN 2300 03/28/15 A BROWNING    Culture GRAM NEGATIVE RODS  Final   Report Status PENDING  Incomplete  Blood Culture (routine x 2)     Status: None (Preliminary result)   Collection Time: 03/28/15 12:54 PM  Result Value Ref Range Status   Specimen Description BLOOD RIGHT HAND  Final   Special Requests BOTTLES DRAWN AEROBIC AND ANAEROBIC 3CC  Final   Culture  Setup Time   Final    GRAM NEGATIVE RODS IN BOTH AEROBIC AND ANAEROBIC BOTTLES CRITICAL RESULT CALLED TO, READ BACK BY AND VERIFIED WITH: B LEAP,RN [id number] [place]    Culture GRAM NEGATIVE RODS  Final   Report Status PENDING  Incomplete  Urine culture     Status: None (Preliminary result)   Collection Time: 03/28/15  1:00 PM  Result Value Ref Range Status   Specimen Description URINE, CATHETERIZED  Final   Special Requests NONE  Final   Culture PENDING  Incomplete   Report Status PENDING  Incomplete  MRSA PCR Screening     Status: None   Collection Time: 03/29/15  3:18 AM  Result Value Ref Range Status   MRSA by PCR NEGATIVE NEGATIVE Final    Comment:        The GeneXpert MRSA Assay (FDA approved for NASAL specimens only), is one component of a comprehensive MRSA colonization surveillance program. It is not intended to diagnose MRSA infection nor to guide or monitor treatment for MRSA infections.      Studies: Ct Abdomen Pelvis Wo Contrast  03/28/2015   CLINICAL DATA:  81 year old male with generalized weakness, low blood sugar and epigastric pain  EXAM: CT ABDOMEN  AND PELVIS WITHOUT CONTRAST  TECHNIQUE: Multidetector CT imaging of the abdomen and pelvis was performed following the standard protocol without IV contrast.  COMPARISON:  Abdominal ultrasound performed earlier today  FINDINGS: Lower Chest: Massive hiatal hernia. Ingested oral contrast material lays within the partially intra thoracic stomach which is significantly dilated with gas. The heart is within normal limits for size. Atherosclerotic calcifications present throughout the visualized coronary arteries. No pericardial effusion. Chronic atelectasis in the bilateral lower lungs worse on the left than the right around the large hiatal hernia. Otherwise, the visualized lower lungs are essentially clear. Focal subpleural steatosis adjacent to the right middle lobe.  Abdomen:  Unenhanced CT was performed per clinician order. Lack of IV contrast limits sensitivity and specificity, especially for evaluation of abdominal/pelvic solid viscera. Mild gaseous distension of the intra-abdominal portion of the stomach. Unremarkable CT appearance of the spleen, adrenal glands and pancreas. The gallbladder is contracted. There is a mild biliary ductal dilatation. A 6 x 8 mm high attenuation structure either within or immediately adjacent to the distal common bile duct. It is unclear if this represents a small periampullary duodenum diverticulum or choledocholithiasis.  The liver is otherwise normal in size and contour. No definite liver lesion given the limitations of absent IV contrast. Nonspecific perinephric stranding bilaterally. No hydronephrosis. No nephrolithiasis. No free fluid or suspicious adenopathy. No evidence of a bowel obstruction or focal bowel Grisanti thickening. Moderate volume of formed stool suggests constipation. Colonic diverticular disease without CT evidence of active inflammation.  Pelvis: The bladder is decompressed. Surgical changes of prior prostatectomy. Trace free fluid layers dependently within the  pelvis.  Bones/Soft Tissues: No acute fracture or aggressive appearing lytic or blastic osseous lesion. Advanced multilevel degenerative disc disease. Small left fat containing inguinal hernia. Dextro convex scoliosis of the lumbar spine.  Vascular: Scattered atherosclerotic vascular calcifications without evidence of aneurysm. Otherwise, evaluation significantly limited in the absence of intravenous contrast.  IMPRESSION: 1. The patient's mid epigastric pain may be related to gaseous distension of the stomach including the large hiatal hernia and intra thoracic portion of the stomach. As the stomach is incompletely imaged, a component of acute or chronic gastric volvulus is difficult to exclude entirely although is clear there is not complete obstruction given passage of ingested oral contrast material into the distal bowel. 2. Small high attenuation structure in the region of the distal common bile duct may represent choledocholithiasis or a small periampullary duodenum diverticulum. A small duodenum diverticulum is favored. Recommend clinical correlation for evidence of a biliary obstruction or right upper quadrant pain. If clinically warranted, MRCP could further evaluate. 3. Nonspecific perinephric stranding bilaterally. 4. Colonic diverticular disease without CT evidence of active inflammation. 5. Small volume free fluid layers within the pelvis. This is abnormal in a male patient has and is likely secondary to an underlying infectious or inflammatory process. 6. Advanced multilevel degenerative disc disease with dextro convex scoliosis. 7. Scattered atherosclerotic vascular calcifications.   Electronically Signed   By: Jacqulynn Cadet M.D.   On: 03/28/2015 20:15   US Abdomen Complete  03/28/2015   CLINICAL DATA:  Elevated liver function studies, elevated lactate level, epigastric pain  EXAM: ULTRASOUND ABDOMEN COMPLETE  COMPARISON:  Abdominal ultrasound of December 13, 2011  FINDINGS: Gallbladder: The  gallbladder is contracted and its Mclinden is irregular. Stones have been described in the past but discrete stones are not observed today.  Common bile duct: Diameter: 8.5 mm  Liver: The liver exhibits no focal mass nor ductal dilation. The surface contour is normal.  IVC: No abnormality visualized.  Pancreas: The pancreas was obscured by bowel gas.  Spleen: Size and appearance within normal limits.  Right Kidney: Length: 10.5 cm. Echogenicity within normal limits. No mass or hydronephrosis visualized.  Left Kidney: Length: 10.5 cm. Echogenicity within normal limits. No mass or hydronephrosis visualized.  Abdominal aorta: No aneurysm visualized.  Other findings: No ascites is demonstrated.  IMPRESSION: 1. Abnormal appearance of the gallbladder with contraction an Cieslak thickening and nodularity. Stones may be present but are not discretely demonstrated. 2. No acute abnormality of the liver, spleen, or kidneys. The pancreas was obscured by bowel  gas. 3. Given the patient's laboratory findings and chief complaint, further evaluation with abdominal and pelvic CT scanning would be useful.   Electronically Signed   By: David  Martinique M.D.   On: 03/28/2015 15:53   Dg Chest Port 1 View  03/28/2015   CLINICAL DATA:  Shortness of Breath  EXAM: PORTABLE CHEST - 1 VIEW  COMPARISON:  Study obtained earlier in the day  FINDINGS: Large hiatal type hernia is again identified with most of the stomach above the diaphragm. The degree of inspiration shallow. There is no frank edema or consolidation. No appreciable joint effusions. Heart is mildly prominent with pulmonary vascularity within normal limits. Patient is status post coronary artery bypass grafting. No adenopathy.  IMPRESSION: Large hiatal type hernia. No edema or consolidation. Degree of inspiration shallow. No new opacity. No change in cardiac silhouette.   Electronically Signed   By: Lowella Grip III M.D.   On: 03/28/2015 18:36   Dg Chest Portable 1 View  03/28/2015    CLINICAL DATA:  Shortness of breath today  EXAM: PORTABLE CHEST - 1 VIEW  COMPARISON:  03/27/2015  FINDINGS: Cardiac shadow is stable. A large hiatal hernia is again identified. Postsurgical changes are again seen. No focal infiltrate or sizable effusion is noted.  IMPRESSION: Stable large hiatal hernia.  No acute abnormality noted.   Electronically Signed   By: Inez Catalina M.D.   On: 03/28/2015 11:45    Scheduled Meds: . antiseptic oral rinse  7 mL Mouth Rinse BID  . aztreonam  1 g Intravenous 3 times per day  . [START ON 03/30/2015] levofloxacin (LEVAQUIN) IV  500 mg Intravenous Q48H  . levothyroxine  25 mcg Oral QAC breakfast  . metronidazole  500 mg Intravenous Q8H  . pravastatin  40 mg Oral Daily  . sodium chloride  3 mL Intravenous Q12H  . vancomycin  750 mg Intravenous Q24H   Continuous Infusions: . sodium chloride 150 mL/hr at 03/29/15 1145    Principal Problem:   Sepsis Active Problems:   Hyperlipidemia   Osteoarthritis of right knee   Atrial flutter    Temitope Griffing K  [facility] Pager [phone]. If 7PM-7AM, please contact night-coverage at [email or web address], password [email or web address] 03/29/2015, 2:48 PM  LOS: 1 day

## 2015-03-29 NOTE — Significant Event (Signed)
Rapid Response Event Note  Overview:  Called to assist with patient with respiratory distress Time Called: 1411 Arrival Time: 1413    Initial Focused Assessment:  On arrival patient sitting up in bed - eyes open - arouses to name - can speak few words.  Skin cool and moist.  Mucous membranes blue - blue coloring bil neck to nipples - mottling of knees noted - on NRB mask - O2 sats 95% - resps very labored - audible rhonchi noted - RT Merry Proud present in room - just suctioned oral airway - some clearing - moving very little air bilateral - upper airway wheeze and rhonchi - using accessory muscles = rate 28 - abd round soft - BP 145/78 HR 85 CBG 135.  Dr. Wyline Copas discussing situation with family.    Interventions:  Stat ABG drawn per order.  Stat call for PCXR.  Resps becoming more labored and decreasing LOC.  Dr. Wyline Copas to room stat - has consulted PCCM.  Prepared for intubation - BVM to room .  IV flushed - stat call for RSI kit.  Dr. Lake Bells and Noe Gens NP to room - 20 mg Etomidate and 100 mg Rocuronium IV given per order.  Patient intubated by Dr. Lake Bells without complication bil BS present - ETCO2 color change-  NS wide open. Initial BP post meds 83/50 HR 114 - bagging - post NS BP recovered 119/69.  PCXR done - Dr. Lake Bells viewing.  Stat transfer to 3M06.  Handoff to [employer].     Event Summary: Name of Physician Notified: Dr. Wyline Copas at  (PTA [facility])  Name of Consulting Physician Notified: Dr. Lake Bells at  (by Dr. Wyline Copas)  Outcome: Transferred (Comment) (58m6)  Event End Time: 1500 (handoff to [place]  BQuin Hoop

## 2015-03-29 NOTE — Procedures (Signed)
Intubation Procedure Note [name] [id number] September 12, 1926  Procedure: Intubation Indications: Respiratory insufficiency  Procedure Details Consent: Unable to obtain consent because of emergent medical necessity. Time Out: Verified patient identification, verified procedure, site/side was marked, verified correct patient position, special equipment/implants available, medications/allergies/relevent history reviewed, required imaging and test results available.  Performed  Drugs Etomidate 20mg , Rocuronium 100mg  IV DL x 1 with GS 3 blade Grade 1 view 7.5 tube passed through cords under direct visualization > some difficulty with passing through tube below glottis, required twisting motion in order to allow tube to pass  Placement confirmed with bilateral breath sounds, positive EtCO2 change and smoke in tube   Evaluation Hemodynamic Status: BP stable throughout; O2 sats: stable throughout Patient's Current Condition: stable Complications: No apparent complications Patient did tolerate procedure well. Chest X-ray ordered to verify placement.  CXR: tube position low-repostitioned.   Roney Youtz 03/29/2015

## 2015-03-29 NOTE — Progress Notes (Signed)
Son arrived from [place]. Paged Dr. Carlean Purl. Dr Carlean Purl spoke with son regarding plan of care. Will continue to monitor.

## 2015-03-29 NOTE — Progress Notes (Signed)
Patient ID: [name], male   DOB: [birth year], 81 y.o.   MRN: [id number]    Subjective: Pt feels well today.  Says he's better.  Objective: Vital signs in last 24 hours: Temp:  [97.3 F (36.3 C)-97.9 F (36.6 C)] 97.7 F (36.5 C) (08/09 0801) Pulse Rate:  [45-80] 61 (08/09 0801) Resp:  [11-26] 19 (08/09 0801) BP: (72-163)/(46-99) 95/47 mmHg (08/09 0801) SpO2:  [90 %-100 %] 97 % (08/09 0801) Weight:  [72.2 kg (159 lb 2.8 oz)-77.6 kg (171 lb 1.2 oz)] 77.6 kg (171 lb 1.2 oz) (08/09 0200) Last BM Date: 03/29/15  Intake/Output from previous day: 08/08 0701 - 08/09 0700 In: 803 [I.V.:503; IV Piggyback:300] Out: 50 [Urine:50] Intake/Output this shift:    PE: Abd: soft, NT, ND, obese, +BS Heart: regular Lungs: expiratory wheeze noted  Lab Results:   Recent Labs  03/28/15 1205 03/29/15 0550  WBC 22.8* 20.6*  HGB 11.3* 10.8*  HCT 31.9* 31.4*  PLT 152 107*   BMET  Recent Labs  03/28/15 1205 03/29/15 0550  NA 129* 128*  K 3.5 5.2*  CL 97* 101  CO2 20* 17*  GLUCOSE 132* 56*  BUN 31* 37*  CREATININE 2.32* 2.08*  CALCIUM 8.5* 7.2*   PT/INR  Recent Labs  03/28/15 1834  LABPROT 27.4*  INR 2.59*   CMP     Component Value Date/Time   NA 128* 03/29/2015 0550   NA 138 12/28/2014 0952   K 5.2* 03/29/2015 0550   CL 101 03/29/2015 0550   CO2 17* 03/29/2015 0550   GLUCOSE 56* 03/29/2015 0550   GLUCOSE 84 12/28/2014 0952   BUN 37* 03/29/2015 0550   BUN 22 12/28/2014 0952   CREATININE 2.08* 03/29/2015 0550   CREATININE 1.33 02/17/2013 1108   CALCIUM 7.2* 03/29/2015 0550   PROT 5.2* 03/29/2015 0550   PROT 6.8 12/28/2014 0952   ALBUMIN 2.6* 03/29/2015 0550   AST 236* 03/29/2015 0550   ALT 352* 03/29/2015 0550   ALKPHOS 98 03/29/2015 0550   BILITOT 6.0* 03/29/2015 0550   BILITOT 0.8 12/28/2014 0952   GFRNONAA 27* 03/29/2015 0550   GFRNONAA 49* 02/17/2013 1108   GFRAA 32* 03/29/2015 0550   GFRAA 56* 02/17/2013 1108   Lipase     Component Value Date/Time    LIPASE 12* 03/28/2015 1205       Studies/Results: Ct Abdomen Pelvis Wo Contrast  03/28/2015   CLINICAL DATA:  81 year old male with generalized weakness, low blood sugar and epigastric pain  EXAM: CT ABDOMEN AND PELVIS WITHOUT CONTRAST  TECHNIQUE: Multidetector CT imaging of the abdomen and pelvis was performed following the standard protocol without IV contrast.  COMPARISON:  Abdominal ultrasound performed earlier today  FINDINGS: Lower Chest: Massive hiatal hernia. Ingested oral contrast material lays within the partially intra thoracic stomach which is significantly dilated with gas. The heart is within normal limits for size. Atherosclerotic calcifications present throughout the visualized coronary arteries. No pericardial effusion. Chronic atelectasis in the bilateral lower lungs worse on the left than the right around the large hiatal hernia. Otherwise, the visualized lower lungs are essentially clear. Focal subpleural steatosis adjacent to the right middle lobe.  Abdomen: Unenhanced CT was performed per clinician order. Lack of IV contrast limits sensitivity and specificity, especially for evaluation of abdominal/pelvic solid viscera. Mild gaseous distension of the intra-abdominal portion of the stomach. Unremarkable CT appearance of the spleen, adrenal glands and pancreas. The gallbladder is contracted. There is a mild biliary ductal dilatation. A 6 x  8 mm high attenuation structure either within or immediately adjacent to the distal common bile duct. It is unclear if this represents a small periampullary duodenum diverticulum or choledocholithiasis.  The liver is otherwise normal in size and contour. No definite liver lesion given the limitations of absent IV contrast. Nonspecific perinephric stranding bilaterally. No hydronephrosis. No nephrolithiasis. No free fluid or suspicious adenopathy. No evidence of a bowel obstruction or focal bowel Vigeant thickening. Moderate volume of formed stool  suggests constipation. Colonic diverticular disease without CT evidence of active inflammation.  Pelvis: The bladder is decompressed. Surgical changes of prior prostatectomy. Trace free fluid layers dependently within the pelvis.  Bones/Soft Tissues: No acute fracture or aggressive appearing lytic or blastic osseous lesion. Advanced multilevel degenerative disc disease. Small left fat containing inguinal hernia. Dextro convex scoliosis of the lumbar spine.  Vascular: Scattered atherosclerotic vascular calcifications without evidence of aneurysm. Otherwise, evaluation significantly limited in the absence of intravenous contrast.  IMPRESSION: 1. The patient's mid epigastric pain may be related to gaseous distension of the stomach including the large hiatal hernia and intra thoracic portion of the stomach. As the stomach is incompletely imaged, a component of acute or chronic gastric volvulus is difficult to exclude entirely although is clear there is not complete obstruction given passage of ingested oral contrast material into the distal bowel. 2. Small high attenuation structure in the region of the distal common bile duct may represent choledocholithiasis or a small periampullary duodenum diverticulum. A small duodenum diverticulum is favored. Recommend clinical correlation for evidence of a biliary obstruction or right upper quadrant pain. If clinically warranted, MRCP could further evaluate. 3. Nonspecific perinephric stranding bilaterally. 4. Colonic diverticular disease without CT evidence of active inflammation. 5. Small volume free fluid layers within the pelvis. This is abnormal in a male patient has and is likely secondary to an underlying infectious or inflammatory process. 6. Advanced multilevel degenerative disc disease with dextro convex scoliosis. 7. Scattered atherosclerotic vascular calcifications.   Electronically Signed   By: Jacqulynn Cadet M.D.   On: 03/28/2015 20:15   US Abdomen  Complete  03/28/2015   CLINICAL DATA:  Elevated liver function studies, elevated lactate level, epigastric pain  EXAM: ULTRASOUND ABDOMEN COMPLETE  COMPARISON:  Abdominal ultrasound of December 13, 2011  FINDINGS: Gallbladder: The gallbladder is contracted and its [place] is irregular. Stones have been described in the past but discrete stones are not observed today.  Common bile duct: Diameter: 8.5 mm  Liver: The liver exhibits no focal mass nor ductal dilation. The surface contour is normal.  IVC: No abnormality visualized.  Pancreas: The pancreas was obscured by bowel gas.  Spleen: Size and appearance within normal limits.  Right Kidney: Length: 10.5 cm. Echogenicity within normal limits. No mass or hydronephrosis visualized.  Left Kidney: Length: 10.5 cm. Echogenicity within normal limits. No mass or hydronephrosis visualized.  Abdominal aorta: No aneurysm visualized.  Other findings: No ascites is demonstrated.  IMPRESSION: 1. Abnormal appearance of the gallbladder with contraction an Calvin thickening and nodularity. Stones may be present but are not discretely demonstrated. 2. No acute abnormality of the liver, spleen, or kidneys. The pancreas was obscured by bowel gas. 3. Given the patient's laboratory findings and chief complaint, further evaluation with abdominal and pelvic CT scanning would be useful.   Electronically Signed   By: David  Martinique M.D.   On: 03/28/2015 15:53   Dg Chest Port 1 View  03/28/2015   CLINICAL DATA:  Shortness of Breath  EXAM:  PORTABLE CHEST - 1 VIEW  COMPARISON:  Study obtained earlier in the day  FINDINGS: Large hiatal type hernia is again identified with most of the stomach above the diaphragm. The degree of inspiration shallow. There is no frank edema or consolidation. No appreciable joint effusions. Heart is mildly prominent with pulmonary vascularity within normal limits. Patient is status post coronary artery bypass grafting. No adenopathy.  IMPRESSION: Large hiatal type hernia.  No edema or consolidation. Degree of inspiration shallow. No new opacity. No change in cardiac silhouette.   Electronically Signed   By: Lowella Grip III M.D.   On: 03/28/2015 18:36   Dg Chest Portable 1 View  03/28/2015   CLINICAL DATA:  Shortness of breath today  EXAM: PORTABLE CHEST - 1 VIEW  COMPARISON:  03/27/2015  FINDINGS: Cardiac shadow is stable. A large hiatal hernia is again identified. Postsurgical changes are again seen. No focal infiltrate or sizable effusion is noted.  IMPRESSION: Stable large hiatal hernia.  No acute abnormality noted.   Electronically Signed   By: Inez Catalina M.D.   On: 03/28/2015 11:45    Anti-infectives: Anti-infectives    Start     Dose/Rate Route Frequency Ordered Stop   03/30/15 1300  levofloxacin (LEVAQUIN) IVPB 500 mg  Status:  Discontinued     500 mg 100 mL/hr over 60 Minutes Intravenous Every 48 hours 03/28/15 1407 03/28/15 1645   03/30/15 1230  levofloxacin (LEVAQUIN) IVPB 500 mg     500 mg 100 mL/hr over 60 Minutes Intravenous Every 48 hours 03/28/15 1707     03/29/15 1500  vancomycin (VANCOCIN) IVPB 750 mg/150 ml premix  Status:  Discontinued     750 mg 150 mL/hr over 60 Minutes Intravenous Every 24 hours 03/28/15 1407 03/28/15 1645   03/29/15 1400  vancomycin (VANCOCIN) IVPB 750 mg/150 ml premix     750 mg 150 mL/hr over 60 Minutes Intravenous Every 24 hours 03/28/15 1713     03/29/15 1230  levofloxacin (LEVAQUIN) IVPB 750 mg  Status:  Discontinued     750 mg 100 mL/hr over 90 Minutes Intravenous  Once 03/28/15 1656 03/28/15 1707   03/28/15 2100  aztreonam (AZACTAM) 1 g in dextrose 5 % 50 mL IVPB  Status:  Discontinued     1 g 100 mL/hr over 30 Minutes Intravenous Every 8 hours 03/28/15 1407 03/28/15 1645   03/28/15 2100  aztreonam (AZACTAM) 1 g in dextrose 5 % 50 mL IVPB     1 g 100 mL/hr over 30 Minutes Intravenous 3 times per day 03/28/15 1713     03/28/15 1700  levofloxacin (LEVAQUIN) IVPB 750 mg  Status:  Discontinued     750  mg 100 mL/hr over 90 Minutes Intravenous  Once 03/28/15 1645 03/28/15 1656   03/28/15 1700  metroNIDAZOLE (FLAGYL) IVPB 500 mg  Status:  Discontinued     500 mg 100 mL/hr over 60 Minutes Intravenous  Once 03/28/15 1645 03/28/15 1658   03/28/15 1700  metroNIDAZOLE (FLAGYL) IVPB 500 mg     500 mg 100 mL/hr over 60 Minutes Intravenous Every 8 hours 03/28/15 1658     03/28/15 1245  vancomycin (VANCOCIN) 1,500 mg in sodium chloride 0.9 % 500 mL IVPB     1,500 mg 250 mL/hr over 120 Minutes Intravenous STAT 03/28/15 1235 03/28/15 1649   03/28/15 1230  levofloxacin (LEVAQUIN) IVPB 750 mg     750 mg 100 mL/hr over 90 Minutes Intravenous  Once 03/28/15 1228 03/28/15 1440  03/28/15 1230  aztreonam (AZACTAM) 2 g in dextrose 5 % 50 mL IVPB     2 g 100 mL/hr over 30 Minutes Intravenous  Once 03/28/15 1228 03/28/15 1354   03/28/15 1230  vancomycin (VANCOCIN) IVPB 1000 mg/200 mL premix  Status:  Discontinued     1,000 mg 200 mL/hr over 60 Minutes Intravenous  Once 03/28/15 1228 03/28/15 1234       Assessment/Plan  1. Abdominal pain/elevated LFTs -CT suggests the possibility of a CBD stone and his LFTs are elevated c/w CBD obstruction.  GI needs to evaluate the patient for ERCP.   -no evidence of cholecystitis on his imaging.  He will need lap chole prior to discharge. -we will follow.   LOS: 1 day    Bently Morath E 03/29/2015, 8:23 AM Pager: [phone]

## 2015-03-29 NOTE — Progress Notes (Signed)
RT note- ETT pulled back from 24 to 23cm per Dr. Lake Bells.

## 2015-03-29 NOTE — Progress Notes (Signed)
Upon RN arrival into pt room O2 sat 79% on 5l [place]. Applied face mask with O2 sat 98%. Appears mottled and increased work of breathing. Pt confused and pulling at face mask. Rapid response and respiratory called to room. Pt intubated and transferred to 58M. Family members notified by MD

## 2015-03-30 ENCOUNTER — Inpatient Hospital Stay (HOSPITAL_COMMUNITY): Payer: Medicare PPO

## 2015-03-30 ENCOUNTER — Encounter (HOSPITAL_COMMUNITY): Payer: Self-pay | Admitting: Physician Assistant

## 2015-03-30 ENCOUNTER — Ambulatory Visit (HOSPITAL_COMMUNITY): Payer: Medicare PPO

## 2015-03-30 DIAGNOSIS — K830 Cholangitis: Secondary | ICD-10-CM

## 2015-03-30 DIAGNOSIS — I509 Heart failure, unspecified: Secondary | ICD-10-CM

## 2015-03-30 DIAGNOSIS — R932 Abnormal findings on diagnostic imaging of liver and biliary tract: Secondary | ICD-10-CM

## 2015-03-30 LAB — POCT I-STAT 3, ART BLOOD GAS (G3+)
Acid-base deficit: 11 mmol/L — ABNORMAL HIGH (ref 0.0–2.0)
Acid-base deficit: 12 mmol/L — ABNORMAL HIGH (ref 0.0–2.0)
BICARBONATE: 14 meq/L — AB (ref 20.0–24.0)
Bicarbonate: 13.9 mEq/L — ABNORMAL LOW (ref 20.0–24.0)
O2 SAT: 97 %
O2 Saturation: 96 %
PO2 ART: 84 mmHg (ref 80.0–100.0)
PO2 ART: 92 mmHg (ref 80.0–100.0)
Patient temperature: 97.3
Patient temperature: 97.3
TCO2: 15 mmol/L (ref 0–100)
TCO2: 15 mmol/L (ref 0–100)
pCO2 arterial: 28.6 mmHg — ABNORMAL LOW (ref 35.0–45.0)
pCO2 arterial: 29 mmHg — ABNORMAL LOW (ref 35.0–45.0)
pH, Arterial: 7.295 — ABNORMAL LOW (ref 7.350–7.450)
pH, Arterial: 7.284 — ABNORMAL LOW (ref 7.350–7.450)

## 2015-03-30 LAB — BLOOD GAS, ARTERIAL
Acid-base deficit: 10.7 mmol/L — ABNORMAL HIGH (ref 0.0–2.0)
BICARBONATE: 13.3 meq/L — AB (ref 20.0–24.0)
Drawn by: 31814
FIO2: 0.4
MECHVT: 600 mL
O2 Saturation: 98.1 %
PCO2 ART: 21.5 mmHg — AB (ref 35.0–45.0)
PEEP/CPAP: 5 cmH2O
PH ART: 7.405 (ref 7.350–7.450)
Patient temperature: 97.4
RATE: 28 resp/min
TCO2: 14 mmol/L (ref 0–100)
pO2, Arterial: 94.5 mmHg (ref 80.0–100.0)

## 2015-03-30 LAB — BASIC METABOLIC PANEL
ANION GAP: 10 (ref 5–15)
Anion gap: 13 (ref 5–15)
BUN: 49 mg/dL — ABNORMAL HIGH (ref 6–20)
BUN: 50 mg/dL — ABNORMAL HIGH (ref 6–20)
CALCIUM: 7.7 mg/dL — AB (ref 8.9–10.3)
CO2: 15 mmol/L — AB (ref 22–32)
CO2: 17 mmol/L — ABNORMAL LOW (ref 22–32)
CREATININE: 3.34 mg/dL — AB (ref 0.61–1.24)
Calcium: 7.4 mg/dL — ABNORMAL LOW (ref 8.9–10.3)
Chloride: 103 mmol/L (ref 101–111)
Chloride: 105 mmol/L (ref 101–111)
Creatinine, Ser: 2.78 mg/dL — ABNORMAL HIGH (ref 0.61–1.24)
GFR calc Af Amer: 22 mL/min — ABNORMAL LOW (ref >60)
GFR calc Af Amer: 18 mL/min — ABNORMAL LOW (ref >60)
GFR, EST NON AFRICAN AMERICAN: 19 mL/min — AB (ref >60)
GFR, EST NON AFRICAN AMERICAN: 15 mL/min — AB (ref >60)
GLUCOSE: 38 mg/dL — AB (ref 65–99)
Glucose, Bld: 83 mg/dL (ref 65–99)
POTASSIUM: 3.8 mmol/L (ref 3.5–5.1)
Potassium: 3.6 mmol/L (ref 3.5–5.1)
Sodium: 131 mmol/L — ABNORMAL LOW (ref 135–145)
Sodium: 132 mmol/L — ABNORMAL LOW (ref 135–145)

## 2015-03-30 LAB — CULTURE, BLOOD (ROUTINE X 2)

## 2015-03-30 LAB — HEPATIC FUNCTION PANEL
ALBUMIN: 2.3 g/dL — AB (ref 3.5–5.0)
ALK PHOS: 110 U/L (ref 38–126)
ALK PHOS: 109 U/L (ref 38–126)
ALT: 234 U/L — ABNORMAL HIGH (ref 17–63)
ALT: 248 U/L — ABNORMAL HIGH (ref 17–63)
ALT: 221 U/L — ABNORMAL HIGH (ref 17–63)
AST: 129 U/L — ABNORMAL HIGH (ref 15–41)
AST: 135 U/L — AB (ref 15–41)
AST: 109 U/L — AB (ref 15–41)
Albumin: 2.2 g/dL — ABNORMAL LOW (ref 3.5–5.0)
Albumin: 2.3 g/dL — ABNORMAL LOW (ref 3.5–5.0)
Alkaline Phosphatase: 132 U/L — ABNORMAL HIGH (ref 38–126)
BILIRUBIN DIRECT: 4.2 mg/dL — AB (ref 0.1–0.5)
BILIRUBIN INDIRECT: 2.2 mg/dL — AB (ref 0.3–0.9)
BILIRUBIN INDIRECT: 2.3 mg/dL — AB (ref 0.3–0.9)
Bilirubin, Direct: 4.4 mg/dL — ABNORMAL HIGH (ref 0.1–0.5)
Bilirubin, Direct: 4.1 mg/dL — ABNORMAL HIGH (ref 0.1–0.5)
Indirect Bilirubin: 2.1 mg/dL — ABNORMAL HIGH (ref 0.3–0.9)
TOTAL PROTEIN: 4.8 g/dL — AB (ref 6.5–8.1)
TOTAL PROTEIN: 5.1 g/dL — AB (ref 6.5–8.1)
TOTAL PROTEIN: 5.1 g/dL — AB (ref 6.5–8.1)
Total Bilirubin: 6.2 mg/dL — ABNORMAL HIGH (ref 0.3–1.2)
Total Bilirubin: 6.6 mg/dL — ABNORMAL HIGH (ref 0.3–1.2)
Total Bilirubin: 6.5 mg/dL — ABNORMAL HIGH (ref 0.3–1.2)

## 2015-03-30 LAB — URINE CULTURE: Culture: NO GROWTH

## 2015-03-30 LAB — PROTIME-INR
INR: 1.86 — ABNORMAL HIGH (ref 0.00–1.49)
PROTHROMBIN TIME: 21.3 s — AB (ref 11.6–15.2)

## 2015-03-30 LAB — GLUCOSE, CAPILLARY
GLUCOSE-CAPILLARY: 96 mg/dL (ref 65–99)
Glucose-Capillary: 41 mg/dL — CL (ref 65–99)
Glucose-Capillary: 60 mg/dL — ABNORMAL LOW (ref 65–99)
Glucose-Capillary: 47 mg/dL — ABNORMAL LOW (ref 65–99)
Glucose-Capillary: 82 mg/dL (ref 65–99)
Glucose-Capillary: 71 mg/dL (ref 65–99)
Glucose-Capillary: 68 mg/dL (ref 65–99)
Glucose-Capillary: 61 mg/dL — ABNORMAL LOW (ref 65–99)
Glucose-Capillary: 100 mg/dL — ABNORMAL HIGH (ref 65–99)

## 2015-03-30 LAB — CBC
HCT: 29.7 % — ABNORMAL LOW (ref 39.0–52.0)
HEMOGLOBIN: 10.2 g/dL — AB (ref 13.0–17.0)
MCH: 31 pg (ref 26.0–34.0)
MCHC: 34.3 g/dL (ref 30.0–36.0)
MCV: 90.3 fL (ref 78.0–100.0)
PLATELETS: 93 10*3/uL — AB (ref 150–400)
RBC: 3.29 MIL/uL — AB (ref 4.22–5.81)
RDW: 14.2 % (ref 11.5–15.5)
WBC: 14.1 10*3/uL — ABNORMAL HIGH (ref 4.0–10.5)

## 2015-03-30 LAB — PHOSPHORUS: PHOSPHORUS: 3.1 mg/dL (ref 2.5–4.6)

## 2015-03-30 LAB — LACTIC ACID, PLASMA: Lactic Acid, Venous: 1.7 mmol/L (ref 0.5–2.0)

## 2015-03-30 LAB — MAGNESIUM: Magnesium: 1.5 mg/dL — ABNORMAL LOW (ref 1.7–2.4)

## 2015-03-30 LAB — TROPONIN I
Troponin I: 0.19 ng/mL — ABNORMAL HIGH (ref <0.031)
Troponin I: 0.14 ng/mL — ABNORMAL HIGH (ref <0.031)

## 2015-03-30 LAB — PROCALCITONIN: Procalcitonin: 50.56 ng/mL

## 2015-03-30 MED ORDER — DEXTROSE 50 % IV SOLN
INTRAVENOUS | Status: AC
  Filled 2015-03-30: qty 50

## 2015-03-30 MED ORDER — DEXTROSE 50 % IV SOLN
25.0000 mL | Freq: Once | INTRAVENOUS | Status: AC
  Administered 2015-03-30: 25 mL via INTRAVENOUS

## 2015-03-30 MED ORDER — VITAL HIGH PROTEIN PO LIQD: 1000.0000 mL | ORAL | Status: DC

## 2015-03-30 MED ORDER — DEXTROSE 10 % IV SOLN
INTRAVENOUS | Status: DC
  Administered 2015-03-30 – 2015-03-31 (×2): via INTRAVENOUS

## 2015-03-30 MED ORDER — MAGNESIUM SULFATE 2 GM/50ML IV SOLN
2.0000 g | Freq: Once | INTRAVENOUS | Status: AC
  Administered 2015-03-30: 2 g via INTRAVENOUS
  Filled 2015-03-30: qty 50

## 2015-03-30 MED ORDER — VITAL AF 1.2 CAL PO LIQD
1000.0000 mL | ORAL | Status: DC
  Filled 2015-03-30 (×4): qty 1000

## 2015-03-30 MED ORDER — DEXTROSE 50 % IV SOLN
INTRAVENOUS | Status: AC
Start: 2015-03-30 — End: 2015-03-30
  Filled 2015-03-30: qty 50

## 2015-03-30 MED ORDER — FUROSEMIDE 10 MG/ML IJ SOLN
20.0000 mg | Freq: Once | INTRAMUSCULAR | Status: AC
  Administered 2015-03-30: 20 mg via INTRAVENOUS
  Filled 2015-03-30: qty 2

## 2015-03-30 MED ORDER — SODIUM CHLORIDE 0.9 % IV BOLUS (SEPSIS)
1000.0000 mL | Freq: Once | INTRAVENOUS | Status: AC
  Administered 2015-03-30: 1000 mL via INTRAVENOUS

## 2015-03-30 MED ORDER — DILTIAZEM HCL 30 MG PO TABS
30.0000 mg | ORAL_TABLET | Freq: Two times a day (BID) | ORAL | Status: DC
  Administered 2015-03-30 – 2015-04-02 (×6): 30 mg via ORAL
  Filled 2015-03-30 (×7): qty 1

## 2015-03-30 MED ORDER — VANCOMYCIN HCL IN DEXTROSE 1-5 GM/200ML-% IV SOLN
1000.0000 mg | INTRAVENOUS | Status: DC
Start: 2015-03-30 — End: 2015-03-31
  Administered 2015-03-30: 1000 mg via INTRAVENOUS
  Filled 2015-03-30 (×2): qty 200

## 2015-03-30 NOTE — Progress Notes (Signed)
ANTIBIOTIC CONSULT NOTE - Follow-up  Pharmacy Consult for vancomycin + aztreonam + flagyl Indication: rule out sepsis  Allergies  Allergen Reactions  . Lipitor [Atorvastatin] Other (See Comments)    "feel bad"  . Penicillins Itching    Patient Measurements: Height: 5\' 10"  (177.8 cm) Weight: 174 lb 2.6 oz (79 kg) IBW/kg (Calculated) : 73  Vital Signs: Temp: 98.5 F (36.9 C) (08/10 0400) Temp Source: Oral (08/10 0400) BP: 104/54 mmHg (08/10 0700) Pulse Rate: 60 (08/10 0700) Intake/Output from previous day: 08/09 0701 - 08/10 0700 In: 2750 [P.O.:50; I.V.:2400; IV Piggyback:300] Out: 750 [Urine:750] Intake/Output from this shift:    Labs:  Recent Labs  03/29/15 0550 03/29/15 1717 03/30/15 0236  WBC 20.6* 15.1* 14.1*  HGB 10.8* 10.6* 10.2*  PLT 107* 99* 93*  CREATININE 2.08* 2.53* 2.78*   Estimated Creatinine Clearance: 19.7 mL/min (by C-G formula based on Cr of 2.78). No results for input(s): VANCOTROUGH, VANCOPEAK, VANCORANDOM, GENTTROUGH, GENTPEAK, GENTRANDOM, TOBRATROUGH, TOBRAPEAK, TOBRARND, AMIKACINPEAK, AMIKACINTROU, AMIKACIN in the last 72 hours.   Microbiology: Recent Results (from the past 720 hour(s))  Blood Culture (routine x 2)     Status: None (Preliminary result)   Collection Time: 03/28/15 12:40 PM  Result Value Ref Range Status   Specimen Description BLOOD RIGHT ANTECUBITAL  Final   Special Requests BOTTLES DRAWN AEROBIC AND ANAEROBIC 5CC  Final   Culture  Setup Time   Final    GRAM NEGATIVE COCCOBACILLI IN BOTH AEROBIC AND ANAEROBIC BOTTLES CRITICAL RESULT CALLED TO, READ BACK BY AND VERIFIED WITH: C GOSS RN 2300 03/28/15 A BROWNING    Culture GRAM NEGATIVE RODS  Final   Report Status PENDING  Incomplete  Blood Culture (routine x 2)     Status: None (Preliminary result)   Collection Time: 03/28/15 12:54 PM  Result Value Ref Range Status   Specimen Description BLOOD RIGHT HAND  Final   Special Requests BOTTLES DRAWN AEROBIC AND ANAEROBIC 3CC   Final   Culture  Setup Time   Final    GRAM NEGATIVE RODS IN BOTH AEROBIC AND ANAEROBIC BOTTLES CRITICAL RESULT CALLED TO, READ BACK BY AND VERIFIED WITH: B LEAP,RN [id number] 0157 [place]    Culture GRAM NEGATIVE RODS  Final   Report Status PENDING  Incomplete  Urine culture     Status: None (Preliminary result)   Collection Time: 03/28/15  1:00 PM  Result Value Ref Range Status   Specimen Description URINE, CATHETERIZED  Final   Special Requests NONE  Final   Culture NO GROWTH 1 DAY  Final   Report Status PENDING  Incomplete  MRSA PCR Screening     Status: None   Collection Time: 03/29/15  3:18 AM  Result Value Ref Range Status   MRSA by PCR NEGATIVE NEGATIVE Final    Comment:        The GeneXpert MRSA Assay (FDA approved for NASAL specimens only), is one component of a comprehensive MRSA colonization surveillance program. It is not intended to diagnose MRSA infection nor to guide or monitor treatment for MRSA infections.     Medications:  Anti-infectives    Start     Dose/Rate Route Frequency Ordered Stop   03/30/15 1400  vancomycin (VANCOCIN) IVPB 1000 mg/200 mL premix     1,000 mg 200 mL/hr over 60 Minutes Intravenous Every 48 hours 03/30/15 0751     03/30/15 1300  levofloxacin (LEVAQUIN) IVPB 500 mg  Status:  Discontinued     500 mg 100 mL/hr over  60 Minutes Intravenous Every 48 hours 03/28/15 1407 03/28/15 1645   03/30/15 1230  levofloxacin (LEVAQUIN) IVPB 500 mg  Status:  Discontinued     500 mg 100 mL/hr over 60 Minutes Intravenous Every 48 hours 03/28/15 1707 03/29/15 1619   03/29/15 1500  vancomycin (VANCOCIN) IVPB 750 mg/150 ml premix  Status:  Discontinued     750 mg 150 mL/hr over 60 Minutes Intravenous Every 24 hours 03/28/15 1407 03/28/15 1645   03/29/15 1400  vancomycin (VANCOCIN) IVPB 750 mg/150 ml premix  Status:  Discontinued     750 mg 150 mL/hr over 60 Minutes Intravenous Every 24 hours 03/28/15 1713 03/30/15 0750   03/29/15 1230  levofloxacin  (LEVAQUIN) IVPB 750 mg  Status:  Discontinued     750 mg 100 mL/hr over 90 Minutes Intravenous  Once 03/28/15 1656 03/28/15 1707   03/28/15 2100  aztreonam (AZACTAM) 1 g in dextrose 5 % 50 mL IVPB  Status:  Discontinued     1 g 100 mL/hr over 30 Minutes Intravenous Every 8 hours 03/28/15 1407 03/28/15 1645   03/28/15 2100  aztreonam (AZACTAM) 1 g in dextrose 5 % 50 mL IVPB     1 g 100 mL/hr over 30 Minutes Intravenous 3 times per day 03/28/15 1713     03/28/15 1700  levofloxacin (LEVAQUIN) IVPB 750 mg  Status:  Discontinued     750 mg 100 mL/hr over 90 Minutes Intravenous  Once 03/28/15 1645 03/28/15 1656   03/28/15 1700  metroNIDAZOLE (FLAGYL) IVPB 500 mg  Status:  Discontinued     500 mg 100 mL/hr over 60 Minutes Intravenous  Once 03/28/15 1645 03/28/15 1658   03/28/15 1700  metroNIDAZOLE (FLAGYL) IVPB 500 mg     500 mg 100 mL/hr over 60 Minutes Intravenous Every 8 hours 03/28/15 1658     03/28/15 1245  vancomycin (VANCOCIN) 1,500 mg in sodium chloride 0.9 % 500 mL IVPB     1,500 mg 250 mL/hr over 120 Minutes Intravenous STAT 03/28/15 1235 03/28/15 1649   03/28/15 1230  levofloxacin (LEVAQUIN) IVPB 750 mg     750 mg 100 mL/hr over 90 Minutes Intravenous  Once 03/28/15 1228 03/28/15 1440   03/28/15 1230  aztreonam (AZACTAM) 2 g in dextrose 5 % 50 mL IVPB     2 g 100 mL/hr over 30 Minutes Intravenous  Once 03/28/15 1228 03/28/15 1354   03/28/15 1230  vancomycin (VANCOCIN) IVPB 1000 mg/200 mL premix  Status:  Discontinued     1,000 mg 200 mL/hr over 60 Minutes Intravenous  Once 03/28/15 1228 03/28/15 1234     Assessment: 9 yom presented to the ED with generalized weakness and hypoglycemia. Pt continues on broad-spectrum antibiotics for sepsis and gram negative bacteremia. Pt is afebrile and WBC is elevated at 14.1. Scr is above baseline and increased from yesterday morning to 2.78. Lactic acid is elevated at 2.9 and PCT is elevated at 42.82. Vancomycin dose never given yesterday.    Levaquin 8/8>>8/9 Aztreo 8/8>> Vanc 8/8>> Flagyl 8/8 >>  8/8 Blood - GNR, GN coccobacilli 8/8 Urine - NGTD  Goal of Therapy:  Vancomycin trough level 15-20 mcg/ml  Plan:  - Change vancomycin to 1gm IV Q48H - Continue flagyl 500mg  IV Q8H - Continue aztreonam 1gm IV Q8H - F/u renal fxn, C&S, clinical status and trough at [facility], PharmD, BCPS Pager # [phone] 03/30/2015 7:54 AM

## 2015-03-30 NOTE — Procedures (Signed)
Extubation Procedure Note  Patient Details:   Name: [name] DOB: 1926/08/26 MRN: [id number]   Airway Documentation:  Airway (Active)     Airway 7.5 mm (Active)  Secured at (cm) 23 cm 03/30/2015  1:20 PM  Measured From Lips 03/30/2015  1:20 PM  Secured Location Right 03/30/2015  1:20 PM  Secured By [employer] 03/30/2015  1:20 PM  Tube Holder Repositioned Yes 03/30/2015 11:03 AM  Cuff Pressure (cm H2O) 24 cm H2O 03/30/2015  9:00 AM  Site Condition Dry 03/30/2015  1:20 PM    Evaluation  O2 sats: stable throughout Complications: No apparent complications Patient did tolerate procedure well. Bilateral Breath Sounds: Clear, Diminished Suctioning: Oral Yes  Patient was extubated per MD order. Breathing mechanics were obtained prior to extubation and the results were: Nif- -40, Vital Capacity- .8 of a Liter, and a positive leak test was noted. Patient was extubated to 6lpm. Breath sounds are diminished and clear with no stridor present. Patient able to speak well post extubation and is in no apparent distress. Rt will continue to monitor.   Melina Schools 03/30/2015, [phone]

## 2015-03-30 NOTE — Progress Notes (Signed)
Gallbladder contracted at this point.  It does not explain elevated LFT's since no gallstones noted in GB. If pt passed gallstone into CBD,  ERCP  Less invasive option. He would not tolerated general anesthesia at this point.   If cholecystitis a concern,  Can have IR evaluate for drainage at this point but it is small ,  Contracted and no visible stones in GB noted. Unlikely removing GB will help  LFT's and drainage would treat cholecystitis. Not in any condition for trip to OR at this point.  Gastric  Volvulus less likely at this point.

## 2015-03-30 NOTE — Progress Notes (Signed)
  Echocardiogram 2D Echocardiogram has been performed.  Darlina Sicilian M 03/30/2015, 2:23 PM

## 2015-03-30 NOTE — Progress Notes (Signed)
eLink Physician-Brief Progress Note Patient Name: [name] DOB: 07-05-1927 MRN: [id number]   Date of Service  03/30/2015  HPI/Events of Note  Multiple issues: 1. Nurse says patient can be extubated per Dr. Chase Caller who told her to call [place] for orders and 2 Patient in AFIB with ventricular rate = 130's. Patient has Hx of PAFib/Flutter on Elaquis. Patient has been on PS5/P 5 for the last hour, however, he has been on PS 10/P 5 prior to that since 11 AM. Patient awake, alert and follows commands.   eICU Interventions  Will order: 1. ABG and weaning parameters now.  2. Cardizem 30 mg PO now and Q 12 hours. Hold for SBP < 100 or HR < 60.     Intervention Category Major Interventions: Respiratory failure - evaluation and management  Chidiebere Wynn Eugene 03/30/2015, 3:21 PM

## 2015-03-30 NOTE — Progress Notes (Signed)
Patient ID: [name], male   DOB: 07-25-1927, 81 y.o.   MRN: [id number]    Subjective: Pt declined overnight and had to be intubated.  Awake on vent this morning and not complaining of abdominal pain.  Objective: Vital signs in last 24 hours: Temp:  [95.7 F (35.4 C)-98.5 F (36.9 C)] 98 F (36.7 C) (08/10 0822) Pulse Rate:  [57-118] 63 (08/10 0900) Resp:  [21-28] 28 (08/10 0900) BP: (92-145)/(53-82) 111/59 mmHg (08/10 0900) SpO2:  [94 %-100 %] 100 % (08/10 0900) FiO2 (%):  [40 %-100 %] 40 % (08/10 0900) Weight:  [79 kg (174 lb 2.6 oz)] 79 kg (174 lb 2.6 oz) (08/09 1600) Last BM Date: 03/29/15  Intake/Output from previous day: 08/09 0701 - 08/10 0700 In: 2750 [P.O.:50; I.V.:2400; IV Piggyback:300] Out: 750 [Urine:750] Intake/Output this shift: Total I/O In: 200 [I.V.:150; IV Piggyback:50] Out: 20 [Urine:60]  PE: Abd: soft, NT, ND, +BS, NGT with no output Heart: regular Lungs: CTAB  Lab Results:   Recent Labs  03/29/15 1717 03/30/15 0236  WBC 15.1* 14.1*  HGB 10.6* 10.2*  HCT 32.2* 29.7*  PLT 99* 93*   BMET  Recent Labs  03/29/15 1717 03/30/15 0236  NA 131* 131*  K 4.5 3.8  CL 102 103  CO2 16* 15*  GLUCOSE 73 38*  BUN 43* 49*  CREATININE 2.53* 2.78*  CALCIUM 7.4* 7.4*   PT/INR  Recent Labs  03/28/15 1834  LABPROT 27.4*  INR 2.59*   CMP     Component Value Date/Time   NA 131* 03/30/2015 0236   NA 138 12/28/2014 0952   K 3.8 03/30/2015 0236   CL 103 03/30/2015 0236   CO2 15* 03/30/2015 0236   GLUCOSE 38* 03/30/2015 0236   GLUCOSE 84 12/28/2014 0952   BUN 49* 03/30/2015 0236   BUN 22 12/28/2014 0952   CREATININE 2.78* 03/30/2015 0236   CREATININE 1.33 02/17/2013 1108   CALCIUM 7.4* 03/30/2015 0236   PROT 4.8* 03/30/2015 0236   PROT 6.8 12/28/2014 0952   ALBUMIN 2.3* 03/30/2015 0236   AST 135* 03/30/2015 0236   ALT 248* 03/30/2015 0236   ALKPHOS 109 03/30/2015 0236   BILITOT 6.6* 03/30/2015 0236   BILITOT 0.8 12/28/2014 0952    GFRNONAA 19* 03/30/2015 0236   GFRNONAA 49* 02/17/2013 1108   GFRAA 22* 03/30/2015 0236   GFRAA 56* 02/17/2013 1108   Lipase     Component Value Date/Time   LIPASE 12* 03/28/2015 1205       Studies/Results: Ct Abdomen Pelvis Wo Contrast  03/28/2015   CLINICAL DATA:  81 year old male with generalized weakness, low blood sugar and epigastric pain  EXAM: CT ABDOMEN AND PELVIS WITHOUT CONTRAST  TECHNIQUE: Multidetector CT imaging of the abdomen and pelvis was performed following the standard protocol without IV contrast.  COMPARISON:  Abdominal ultrasound performed earlier today  FINDINGS: Lower Chest: Massive hiatal hernia. Ingested oral contrast material lays within the partially intra thoracic stomach which is significantly dilated with gas. The heart is within normal limits for size. Atherosclerotic calcifications present throughout the visualized coronary arteries. No pericardial effusion. Chronic atelectasis in the bilateral lower lungs worse on the left than the right around the large hiatal hernia. Otherwise, the visualized lower lungs are essentially clear. Focal subpleural steatosis adjacent to the right middle lobe.  Abdomen: Unenhanced CT was performed per clinician order. Lack of IV contrast limits sensitivity and specificity, especially for evaluation of abdominal/pelvic solid viscera. Mild gaseous distension of the intra-abdominal portion  of the stomach. Unremarkable CT appearance of the spleen, adrenal glands and pancreas. The gallbladder is contracted. There is a mild biliary ductal dilatation. A 6 x 8 mm high attenuation structure either within or immediately adjacent to the distal common bile duct. It is unclear if this represents a small periampullary duodenum diverticulum or choledocholithiasis.  The liver is otherwise normal in size and contour. No definite liver lesion given the limitations of absent IV contrast. Nonspecific perinephric stranding bilaterally. No hydronephrosis. No  nephrolithiasis. No free fluid or suspicious adenopathy. No evidence of a bowel obstruction or focal bowel Strzelecki thickening. Moderate volume of formed stool suggests constipation. Colonic diverticular disease without CT evidence of active inflammation.  Pelvis: The bladder is decompressed. Surgical changes of prior prostatectomy. Trace free fluid layers dependently within the pelvis.  Bones/Soft Tissues: No acute fracture or aggressive appearing lytic or blastic osseous lesion. Advanced multilevel degenerative disc disease. Small left fat containing inguinal hernia. Dextro convex scoliosis of the lumbar spine.  Vascular: Scattered atherosclerotic vascular calcifications without evidence of aneurysm. Otherwise, evaluation significantly limited in the absence of intravenous contrast.  IMPRESSION: 1. The patient's mid epigastric pain may be related to gaseous distension of the stomach including the large hiatal hernia and intra thoracic portion of the stomach. As the stomach is incompletely imaged, a component of acute or chronic gastric volvulus is difficult to exclude entirely although is clear there is not complete obstruction given passage of ingested oral contrast material into the distal bowel. 2. Small high attenuation structure in the region of the distal common bile duct may represent choledocholithiasis or a small periampullary duodenum diverticulum. A small duodenum diverticulum is favored. Recommend clinical correlation for evidence of a biliary obstruction or right upper quadrant pain. If clinically warranted, MRCP could further evaluate. 3. Nonspecific perinephric stranding bilaterally. 4. Colonic diverticular disease without CT evidence of active inflammation. 5. Small volume free fluid layers within the pelvis. This is abnormal in a male patient has and is likely secondary to an underlying infectious or inflammatory process. 6. Advanced multilevel degenerative disc disease with dextro convex scoliosis.  7. Scattered atherosclerotic vascular calcifications.   Electronically Signed   By: Jacqulynn Cadet M.D.   On: 03/28/2015 20:15   US Abdomen Complete  03/28/2015   CLINICAL DATA:  Elevated liver function studies, elevated lactate level, epigastric pain  EXAM: ULTRASOUND ABDOMEN COMPLETE  COMPARISON:  Abdominal ultrasound of December 13, 2011  FINDINGS: Gallbladder: The gallbladder is contracted and its Berringer is irregular. Stones have been described in the past but discrete stones are not observed today.  Common bile duct: Diameter: 8.5 mm  Liver: The liver exhibits no focal mass nor ductal dilation. The surface contour is normal.  IVC: No abnormality visualized.  Pancreas: The pancreas was obscured by bowel gas.  Spleen: Size and appearance within normal limits.  Right Kidney: Length: 10.5 cm. Echogenicity within normal limits. No mass or hydronephrosis visualized.  Left Kidney: Length: 10.5 cm. Echogenicity within normal limits. No mass or hydronephrosis visualized.  Abdominal aorta: No aneurysm visualized.  Other findings: No ascites is demonstrated.  IMPRESSION: 1. Abnormal appearance of the gallbladder with contraction an Vanderpool thickening and nodularity. Stones may be present but are not discretely demonstrated. 2. No acute abnormality of the liver, spleen, or kidneys. The pancreas was obscured by bowel gas. 3. Given the patient's laboratory findings and chief complaint, further evaluation with abdominal and pelvic CT scanning would be useful.   Electronically Signed   By: Shanon Brow  [place] M.D.   On: 03/28/2015 15:53   Dg Chest Port 1 View  03/29/2015   CLINICAL DATA:  Evaluate endotracheal tube position.  EXAM: PORTABLE CHEST - 1 VIEW  COMPARISON:  03/29/2015.  FINDINGS: Support apparatus: Endotracheal tube 3 cm from the carina. Enteric tube is present with the tip not visible. Side port is in the proximal gastric fundus. Monitoring leads project over the chest.  Cardiomediastinal Silhouette:  Unchanged.  Lungs:  Bilateral basilar atelectasis, greater on the LEFT when compared to the RIGHT. No pneumothorax.  Effusions: RIGHT-greater-than-LEFT pleural effusions, likely partially loculated on the RIGHT.  Other:  Large hiatal hernia with intrathoracic gastric air bubble.  IMPRESSION: 1. Endotracheal tube tip 3 cm from the carina. 2. Nasogastric tube in the stomach. 3. Cardiomegaly with bilateral pleural effusions. 4. Large hiatal hernia.   Electronically Signed   By: Dereck Ligas M.D.   On: 03/29/2015 18:40   Dg Chest Port 1 View  03/29/2015   CLINICAL DATA:  Intubation  EXAM: PORTABLE CHEST - 1 VIEW  COMPARISON:  03/28/2015  FINDINGS: Endotracheal tube just above the carina. Recommend withdrawal of 3 cm.  Hypoventilation. Elevated left hemidiaphragm with left lower lobe atelectasis unchanged. Mild right lower lobe atelectasis and small bilateral effusions.  Improved aeration of the lungs compared with earlier today  IMPRESSION: Endotracheal tube at the carina, recommend withdrawal 3 cm.  Improved aeration in the lung bases. Persistent bibasilar atelectasis has improved. Small bilateral pleural effusions.   Electronically Signed   By: Franchot Gallo M.D.   On: 03/29/2015 14:59   Dg Chest Port 1 View  03/28/2015   CLINICAL DATA:  Shortness of Breath  EXAM: PORTABLE CHEST - 1 VIEW  COMPARISON:  Study obtained earlier in the day  FINDINGS: Large hiatal type hernia is again identified with most of the stomach above the diaphragm. The degree of inspiration shallow. There is no frank edema or consolidation. No appreciable joint effusions. Heart is mildly prominent with pulmonary vascularity within normal limits. Patient is status post coronary artery bypass grafting. No adenopathy.  IMPRESSION: Large hiatal type hernia. No edema or consolidation. Degree of inspiration shallow. No new opacity. No change in cardiac silhouette.   Electronically Signed   By: Lowella Grip III M.D.   On: 03/28/2015 18:36   Dg Chest Portable 1  View  03/28/2015   CLINICAL DATA:  Shortness of breath today  EXAM: PORTABLE CHEST - 1 VIEW  COMPARISON:  03/27/2015  FINDINGS: Cardiac shadow is stable. A large hiatal hernia is again identified. Postsurgical changes are again seen. No focal infiltrate or sizable effusion is noted.  IMPRESSION: Stable large hiatal hernia.  No acute abnormality noted.   Electronically Signed   By: Inez Catalina M.D.   On: 03/28/2015 11:45   Dg Abd Portable 1v  03/29/2015   CLINICAL DATA:  Check NG tube position  EXAM: PORTABLE ABDOMEN - 1 VIEW  COMPARISON:  03/29/2015 at 1527 hours  FINDINGS: Enteric tube terminates below the diaphragm in the gastric body.  Nonobstructive bowel gas pattern.  Nodule contrast in the distal colon.  IMPRESSION: Enteric tube terminates below the diaphragm in the gastric body.   Electronically Signed   By: Julian Hy M.D.   On: 03/29/2015 16:55   Dg Abd Portable 1v  03/29/2015   CLINICAL DATA:  NG tube placement.  EXAM: PORTABLE ABDOMEN - 1 VIEW  COMPARISON:  CT scan 03/28/2015.  FINDINGS: The NG tube tip is in the lower aspect  of the chest. This is likely in the distal esophagus or possibly the large hiatal hernia. The proximal port is in the mid esophagus.  The stomach demonstrates gaseous distention. There is contrast in the bowel.  IMPRESSION: The NG tube tip is in the distal esophagus or large hiatal hernia.   Electronically Signed   By: Marijo Sanes M.D.   On: 03/29/2015 15:55    Anti-infectives: Anti-infectives    Start     Dose/Rate Route Frequency Ordered Stop   03/30/15 1400  vancomycin (VANCOCIN) IVPB 1000 mg/200 mL premix     1,000 mg 200 mL/hr over 60 Minutes Intravenous Every 48 hours 03/30/15 0751     03/30/15 1300  levofloxacin (LEVAQUIN) IVPB 500 mg  Status:  Discontinued     500 mg 100 mL/hr over 60 Minutes Intravenous Every 48 hours 03/28/15 1407 03/28/15 1645   03/30/15 1230  levofloxacin (LEVAQUIN) IVPB 500 mg  Status:  Discontinued     500 mg 100 mL/hr over  60 Minutes Intravenous Every 48 hours 03/28/15 1707 03/29/15 1619   03/29/15 1500  vancomycin (VANCOCIN) IVPB 750 mg/150 ml premix  Status:  Discontinued     750 mg 150 mL/hr over 60 Minutes Intravenous Every 24 hours 03/28/15 1407 03/28/15 1645   03/29/15 1400  vancomycin (VANCOCIN) IVPB 750 mg/150 ml premix  Status:  Discontinued     750 mg 150 mL/hr over 60 Minutes Intravenous Every 24 hours 03/28/15 1713 03/30/15 0750   03/29/15 1230  levofloxacin (LEVAQUIN) IVPB 750 mg  Status:  Discontinued     750 mg 100 mL/hr over 90 Minutes Intravenous  Once 03/28/15 1656 03/28/15 1707   03/28/15 2100  aztreonam (AZACTAM) 1 g in dextrose 5 % 50 mL IVPB  Status:  Discontinued     1 g 100 mL/hr over 30 Minutes Intravenous Every 8 hours 03/28/15 1407 03/28/15 1645   03/28/15 2100  aztreonam (AZACTAM) 1 g in dextrose 5 % 50 mL IVPB     1 g 100 mL/hr over 30 Minutes Intravenous 3 times per day 03/28/15 1713     03/28/15 1700  levofloxacin (LEVAQUIN) IVPB 750 mg  Status:  Discontinued     750 mg 100 mL/hr over 90 Minutes Intravenous  Once 03/28/15 1645 03/28/15 1656   03/28/15 1700  metroNIDAZOLE (FLAGYL) IVPB 500 mg  Status:  Discontinued     500 mg 100 mL/hr over 60 Minutes Intravenous  Once 03/28/15 1645 03/28/15 1658   03/28/15 1700  metroNIDAZOLE (FLAGYL) IVPB 500 mg     500 mg 100 mL/hr over 60 Minutes Intravenous Every 8 hours 03/28/15 1658     03/28/15 1245  vancomycin (VANCOCIN) 1,500 mg in sodium chloride 0.9 % 500 mL IVPB     1,500 mg 250 mL/hr over 120 Minutes Intravenous STAT 03/28/15 1235 03/28/15 1649   03/28/15 1230  levofloxacin (LEVAQUIN) IVPB 750 mg     750 mg 100 mL/hr over 90 Minutes Intravenous  Once 03/28/15 1228 03/28/15 1440   03/28/15 1230  aztreonam (AZACTAM) 2 g in dextrose 5 % 50 mL IVPB     2 g 100 mL/hr over 30 Minutes Intravenous  Once 03/28/15 1228 03/28/15 1354   03/28/15 1230  vancomycin (VANCOCIN) IVPB 1000 mg/200 mL premix  Status:  Discontinued     1,000  mg 200 mL/hr over 60 Minutes Intravenous  Once 03/28/15 1228 03/28/15 1234       Assessment/Plan   1. Abdominal pain/elevated LFTs -CT suggests the  possibility of a CBD stone and his LFTs are elevated c/w CBD obstruction.  -TB increased yesterday to 6.6 and direct bilirubin elevated.   -GI evaluated and did not feel he had a CBD stone that was suggested on his CT scan -they are evaluating with a repeat US and possible endo to evaluate his hiatal hernia.  Doubtful his hiatal hernia has caused his elevation in LFTs.  He doesn't have pain or emesis so doubt he has volvulus or obstruction from this. -no evidence of cholecystitis on his imaging. -we will follow.   LOS: 2 days    Camauri Craton E 03/30/2015, 10:03 AM Pager: [phone]

## 2015-03-30 NOTE — Progress Notes (Signed)
PULMONARY / CRITICAL CARE MEDICINE   Name: [name] MRN: [id number] DOB: 01-Aug-1927    ADMISSION DATE:  03/28/2015 CONSULTATION DATE:  03/29/15  REFERRING MD :  Dr. 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  He had been seen at an urgent care the day prior for epigastric pain.    On EMS arrival to the home, the patient was found to be hypotensive and hypoglycemic which responded to IVF's and dextrose.  The patient reported of approximately 48 hours of epigastric pain, malaise and fevers.   Initial work up showed temp 101, WBC 22.8, tachycardia, hypotension, AST/ALT 454/521 / bilirubin 6.1, sr cr 2.32, Na 129 and a lactic acid of 4.56.  Initial CXR showed a large hiatal hernia but no edema or consolidation.  An ultrasound of the abdomen was obtained and revealed an abnormal appearance of the gallbladder with contraction, Bouley thickening and nodularity without discrete.  Follow up CT of the abdomen was assessed and showed an large, non-obstructing hiatal hernia with intrathoracic stomach dilatation, colon diverticulosis, peri-nephric stranding and free fluid in the pelvis.  The patient was admitted per [facility].  He was also evaluated by GI and CCS who both feel findings are consistent with cholangitis and sepsis.  The patient was volume resuscitated, treated with abx (aztreonam/vanco in ER, then to levaquin / flagyl), pan cultured and monitored on SDU.  Unfortunately, the afternoon of 8/9, he deteriorated and required emergent intubation with transfer to ICU.  PCCM consulted for evaluation.       STUDIES:/EVENTS 8/08  Admitted with abdominal pain, hypotension. 8/08  [place] ABD >> abnormal appearance of the  gallbladder with contraction, Carandang thickening and nodularity, no discrete stones, no other acute abnormality.  CBD 8.5 mm 8/08  CT ABD/Pelvis >> gaseous distention of the stomach, large hiatal hernia, small high attenuation structure in the distal common bile duct (? Choledocholithiasis or small periampullary duodenum diverticulum), non-specific perinephric stranding, small volume free fluid within the pelvis  03/29/15: intubatted. Moved to ICU      HISTORY OF PRESENT ILLNESS:   03/30/15: Not on pressors. Remains intubated but following commands. No SBT yet. Repeated transient asymptomatic hypoglycemia per RN. He has denued abd pain to CCS and GI. CCS suspecting CBD stone with obstruction based on LFT but d/w DR Carlean Purl of GI - he reviewed CT with radiology - CBD stone/dilatation presence not convincing. GI  Blood culture growing Gram Negativ Coccobacilli    SUBJECTIVE:   VITAL SIGNS: Temp:  [95.7 F (35.4 C)-98.5 F (36.9 C)] 98 F (36.7 C) (08/10 0822) Pulse Rate:  [57-118] 61 (08/10 1000) Resp:  [21-29] 29 (08/10 1000) BP: (92-145)/(53-82) 98/59 mmHg (08/10 1000) SpO2:  [94 %-100 %] 99 % (08/10 1000) FiO2 (%):  [40 %-100 %] 40 % (08/10 1000) Weight:  [79 kg (174 lb 2.6 oz)] 79 kg (174 lb 2.6 oz) (08/09 1600)   HEMODYNAMICS:     VENTILATOR SETTINGS: Vent Mode:  [-] PRVC FiO2 (%):  [40 %-100 %] 40 % Set Rate:  [28 bmp] 28 bmp Vt Set:  [600 mL] 600 mL PEEP:  [5 cmH20] 5 cmH20 Plateau Pressure:  [21 JJO84-16 cmH20] 24 cmH20   INTAKE /  OUTPUT:  Intake/Output Summary (Last 24 hours) at 03/30/15 1037 Last data filed at 03/30/15 1005  Gross per 24 hour  Intake 3312.5 ml  Output    810 ml  Net 2502.5 ml    PHYSICAL EXAMINATION: General:  Elderly male on vent Neuro:  Sedate, no distress . CAM-ICU neg for delirium. RASS 0. Moves all 4s.  HEENT:  MM pink/moist, OETT Cardiovascular:  s1s2 rrr, no m/r/g Lungs:  resp's even/non-labored, lungs bilaterally coarse, no wheeze Abdomen:   Obese, distended, bsx4 hypoactive  Musculoskeletal:  No acute deformities  Skin:  Warm/dry, mottled prior to intubation, improved / pink  LABS: PULMONARY  Recent Labs Lab 03/29/15 1425 03/29/15 1635 03/30/15 0437  PHART 6.944* 7.261* 7.405  PCO2ART 78.3* 30.2* 21.5*  PO2ART 87.7 241* 94.5  HCO3 16.1* 13.1* 13.3*  TCO2 18.5 14.0 14.0  O2SAT 91.2 99.3 98.1    CBC  Recent Labs Lab 03/29/15 0550 03/29/15 1717 03/30/15 0236  HGB 10.8* 10.6* 10.2*  HCT 31.4* 32.2* 29.7*  WBC 20.6* 15.1* 14.1*  PLT 107* 99* 93*    COAGULATION  Recent Labs Lab 03/28/15 1834  INR 2.59*    CARDIAC   Recent Labs Lab 03/29/15 1735  TROPONINI 0.19*   No results for input(s): PROBNP in the last 168 hours.   CHEMISTRY  Recent Labs Lab 03/28/15 1205 03/29/15 0550 03/29/15 1717 03/30/15 0236  NA 129* 128* 131* 131*  K 3.5 5.2* 4.5 3.8  CL 97* 101 102 103  CO2 20* 17* 16* 15*  GLUCOSE 132* 56* 73 38*  BUN 31* 37* 43* 49*  CREATININE 2.32* 2.08* 2.53* 2.78*  CALCIUM 8.5* 7.2* 7.4* 7.4*  MG  --   --   --  1.5*  PHOS  --   --   --  3.1   Estimated Creatinine Clearance: 19.7 mL/min (by C-G formula based on Cr of 2.78).   LIVER  Recent Labs Lab 03/28/15 1205 03/28/15 1834 03/29/15 0550 03/30/15 0236  AST 454*  --  236* 135*  ALT 521*  --  352* 248*  ALKPHOS 118  --  98 109  BILITOT 6.1*  --  6.0* 6.6*  PROT 5.6*  --  5.2* 4.8*  ALBUMIN 2.9*  --  2.6* 2.3*  INR  --  2.59*  --   --      INFECTIOUS  Recent Labs Lab 03/28/15 1834 03/28/15 2243 03/29/15 1611  LATICACIDVEN 3.9* 2.7* 2.9*  PROCALCITON 42.82  --   --      ENDOCRINE CBG (last 3)   Recent Labs  03/30/15 0652 03/30/15 0821 03/30/15 0848  GLUCAP 60* 47* 82         IMAGING x48h  - image(s) personally visualized  -   highlighted in bold Ct Abdomen Pelvis Wo Contrast  03/28/2015   CLINICAL DATA:  81 year old male with generalized weakness, low blood sugar and epigastric pain  EXAM: CT  ABDOMEN AND PELVIS WITHOUT CONTRAST  TECHNIQUE: Multidetector CT imaging of the abdomen and pelvis was performed following the standard protocol without IV contrast.  COMPARISON:  Abdominal ultrasound performed earlier today  FINDINGS: Lower Chest: Massive hiatal hernia. Ingested oral contrast material lays within the partially intra thoracic stomach which is significantly dilated with gas. The heart is within normal limits for size. Atherosclerotic calcifications present throughout the visualized coronary arteries. No pericardial effusion. Chronic atelectasis in the bilateral lower lungs worse on the left than the right around the large hiatal hernia. Otherwise, the visualized lower  lungs are essentially clear. Focal subpleural steatosis adjacent to the right middle lobe.  Abdomen: Unenhanced CT was performed per clinician order. Lack of IV contrast limits sensitivity and specificity, especially for evaluation of abdominal/pelvic solid viscera. Mild gaseous distension of the intra-abdominal portion of the stomach. Unremarkable CT appearance of the spleen, adrenal glands and pancreas. The gallbladder is contracted. There is a mild biliary ductal dilatation. A 6 x 8 mm high attenuation structure either within or immediately adjacent to the distal common bile duct. It is unclear if this represents a small periampullary duodenum diverticulum or choledocholithiasis.  The liver is otherwise normal in size and contour. No definite liver lesion given the limitations of absent IV contrast. Nonspecific perinephric stranding bilaterally. No hydronephrosis. No nephrolithiasis. No free fluid or suspicious adenopathy. No evidence of a bowel obstruction or focal bowel Soltys thickening. Moderate volume of formed stool suggests constipation. Colonic diverticular disease without CT evidence of active inflammation.  Pelvis: The bladder is decompressed. Surgical changes of prior prostatectomy. Trace free fluid layers dependently within  the pelvis.  Bones/Soft Tissues: No acute fracture or aggressive appearing lytic or blastic osseous lesion. Advanced multilevel degenerative disc disease. Small left fat containing inguinal hernia. Dextro convex scoliosis of the lumbar spine.  Vascular: Scattered atherosclerotic vascular calcifications without evidence of aneurysm. Otherwise, evaluation significantly limited in the absence of intravenous contrast.  IMPRESSION: 1. The patient's mid epigastric pain may be related to gaseous distension of the stomach including the large hiatal hernia and intra thoracic portion of the stomach. As the stomach is incompletely imaged, a component of acute or chronic gastric volvulus is difficult to exclude entirely although is clear there is not complete obstruction given passage of ingested oral contrast material into the distal bowel. 2. Small high attenuation structure in the region of the distal common bile duct may represent choledocholithiasis or a small periampullary duodenum diverticulum. A small duodenum diverticulum is favored. Recommend clinical correlation for evidence of a biliary obstruction or right upper quadrant pain. If clinically warranted, MRCP could further evaluate. 3. Nonspecific perinephric stranding bilaterally. 4. Colonic diverticular disease without CT evidence of active inflammation. 5. Small volume free fluid layers within the pelvis. This is abnormal in a male patient has and is likely secondary to an underlying infectious or inflammatory process. 6. Advanced multilevel degenerative disc disease with dextro convex scoliosis. 7. Scattered atherosclerotic vascular calcifications.   Electronically Signed   By: Jacqulynn Cadet M.D.   On: 03/28/2015 20:15   US Abdomen Complete  03/28/2015   CLINICAL DATA:  Elevated liver function studies, elevated lactate level, epigastric pain  EXAM: ULTRASOUND ABDOMEN COMPLETE  COMPARISON:  Abdominal ultrasound of December 13, 2011  FINDINGS: Gallbladder: The  gallbladder is contracted and its Walters is irregular. Stones have been described in the past but discrete stones are not observed today.  Common bile duct: Diameter: 8.5 mm  Liver: The liver exhibits no focal mass nor ductal dilation. The surface contour is normal.  IVC: No abnormality visualized.  Pancreas: The pancreas was obscured by bowel gas.  Spleen: Size and appearance within normal limits.  Right Kidney: Length: 10.5 cm. Echogenicity within normal limits. No mass or hydronephrosis visualized.  Left Kidney: Length: 10.5 cm. Echogenicity within normal limits. No mass or hydronephrosis visualized.  Abdominal aorta: No aneurysm visualized.  Other findings: No ascites is demonstrated.  IMPRESSION: 1. Abnormal appearance of the gallbladder with contraction an Hursey thickening and nodularity. Stones may be present but are not discretely demonstrated. 2.  No acute abnormality of the liver, spleen, or kidneys. The pancreas was obscured by bowel gas. 3. Given the patient's laboratory findings and chief complaint, further evaluation with abdominal and pelvic CT scanning would be useful.   Electronically Signed   By: David  Martinique M.D.   On: 03/28/2015 15:53   Dg Chest Port 1 View  03/29/2015   CLINICAL DATA:  Evaluate endotracheal tube position.  EXAM: PORTABLE CHEST - 1 VIEW  COMPARISON:  03/29/2015.  FINDINGS: Support apparatus: Endotracheal tube 3 cm from the carina. Enteric tube is present with the tip not visible. Side port is in the proximal gastric fundus. Monitoring leads project over the chest.  Cardiomediastinal Silhouette:  Unchanged.  Lungs: Bilateral basilar atelectasis, greater on the LEFT when compared to the RIGHT. No pneumothorax.  Effusions: RIGHT-greater-than-LEFT pleural effusions, likely partially loculated on the RIGHT.  Other:  Large hiatal hernia with intrathoracic gastric air bubble.  IMPRESSION: 1. Endotracheal tube tip 3 cm from the carina. 2. Nasogastric tube in the stomach. 3. Cardiomegaly  with bilateral pleural effusions. 4. Large hiatal hernia.   Electronically Signed   By: Dereck Ligas M.D.   On: 03/29/2015 18:40   Dg Chest Port 1 View  03/29/2015   CLINICAL DATA:  Intubation  EXAM: PORTABLE CHEST - 1 VIEW  COMPARISON:  03/28/2015  FINDINGS: Endotracheal tube just above the carina. Recommend withdrawal of 3 cm.  Hypoventilation. Elevated left hemidiaphragm with left lower lobe atelectasis unchanged. Mild right lower lobe atelectasis and small bilateral effusions.  Improved aeration of the lungs compared with earlier today  IMPRESSION: Endotracheal tube at the carina, recommend withdrawal 3 cm.  Improved aeration in the lung bases. Persistent bibasilar atelectasis has improved. Small bilateral pleural effusions.   Electronically Signed   By: Franchot Gallo M.D.   On: 03/29/2015 14:59   Dg Chest Port 1 View  03/28/2015   CLINICAL DATA:  Shortness of Breath  EXAM: PORTABLE CHEST - 1 VIEW  COMPARISON:  Study obtained earlier in the day  FINDINGS: Large hiatal type hernia is again identified with most of the stomach above the diaphragm. The degree of inspiration shallow. There is no frank edema or consolidation. No appreciable joint effusions. Heart is mildly prominent with pulmonary vascularity within normal limits. Patient is status post coronary artery bypass grafting. No adenopathy.  IMPRESSION: Large hiatal type hernia. No edema or consolidation. Degree of inspiration shallow. No new opacity. No change in cardiac silhouette.   Electronically Signed   By: Lowella Grip III M.D.   On: 03/28/2015 18:36   Dg Chest Portable 1 View  03/28/2015   CLINICAL DATA:  Shortness of breath today  EXAM: PORTABLE CHEST - 1 VIEW  COMPARISON:  03/27/2015  FINDINGS: Cardiac shadow is stable. A large hiatal hernia is again identified. Postsurgical changes are again seen. No focal infiltrate or sizable effusion is noted.  IMPRESSION: Stable large hiatal hernia.  No acute abnormality noted.   Electronically  Signed   By: Inez Catalina M.D.   On: 03/28/2015 11:45   Dg Abd Portable 1v  03/29/2015   CLINICAL DATA:  Check NG tube position  EXAM: PORTABLE ABDOMEN - 1 VIEW  COMPARISON:  03/29/2015 at 1527 hours  FINDINGS: Enteric tube terminates below the diaphragm in the gastric body.  Nonobstructive bowel gas pattern.  Nodule contrast in the distal colon.  IMPRESSION: Enteric tube terminates below the diaphragm in the gastric body.   Electronically Signed   By: Henderson Newcomer.D.  On: 03/29/2015 16:55   Dg Abd Portable 1v  03/29/2015   CLINICAL DATA:  NG tube placement.  EXAM: PORTABLE ABDOMEN - 1 VIEW  COMPARISON:  CT scan 03/28/2015.  FINDINGS: The NG tube tip is in the lower aspect of the chest. This is likely in the distal esophagus or possibly the large hiatal hernia. The proximal port is in the mid esophagus.  The stomach demonstrates gaseous distention. There is contrast in the bowel.  IMPRESSION: The NG tube tip is in the distal esophagus or large hiatal hernia.   Electronically Signed   By: Marijo Sanes M.D.   On: 03/29/2015 15:55          ASSESSMENT / PLAN:  PULMONARY OETT 8/9 >> A: Acute Hypercarbic Respiratory Failure    - meets SBT critteria 03/30/2015   P:   Full vent support -r educe RR tto 14 Try SBT but no extubation 03/30/2015 in anticipation of procedure 03/30/2015  Wean PEEP / FiO2 for sats > 92% Follow up ABG and CXR post intubation  PRN albuterol  Daily SBT / WUA   CARDIOVASCULAR CVL A:  #[place]  - PAF/AF - on Eliquis Hx HTN, CAD s/p CABG  #AT admit Severe Sepsis -GNR septicemia - Source ? Gallbladder but GI not convinced  P:  ICU monitoring  NS @ 150 ml/hr + fluid bolus Hold Eliquis and Hold home norvasc, HCTZ, lisinopril, toprol Assess ECHO  - results pending Repeat troponin   RENAL A:   Metabolic / Lactic Acidosis - in setting of sepsis  Acute on Chronic CKD - baseline sr cr ~ 1.3    - creat getting worse - AKI worsenning  P:   FLuid bolus  and repeat BMET at 1pm Trend BMP / UOP  Replace electrolytes as indicated  Trend lactic acid, repeat now   GASTROINTESTINAL A:   Large Hiatal Hernia  ? Cholangitis    - DR Carlean Purl not sure if primary gall bladder issue  P:   PPI for SUP NPO GI / CCS Following  - d./w DR Anda Kraft Trend LFT's  STAT  AXR Stat   HEMATOLOGIC A:   Anemia  Thrombocytopenia - in setting of severe sepsis  Elevated PT P:  Trend CBC  SCD's for DVT Monitor PT Hold Eliquis for now until decisions regarding further interventions  INFECTIOUS A:   Severe Sepsis / GNR Bacteremia - in setting of suspected abdominal source infection, ? Cholangitis  P:   BCx2 8/8 >> GNR 2/2 >>  UC 8/8 >>  Sputum 8/9 >>   Levaquin, start date 8/8, day 2//2, d/c 8/9 Flagyl, start date 8/8, day 2/x Azactam, start date 8/8, day 2/x Vanco, start date 8/9, day 1/x  ENDOCRINE A:   Hypoglycemia  P:   CBG's Q4 while NPO Start d10  NEUROLOGIC A:   Acute Metabolic Encephalopathy - in setting of severe sepsis   - resolved P:   RASS goal: -1 Fentanyl PRN for pain Versed PRN for sedation    FAMILY  - Updates:  Myka Lukins - son, [phone]. Updated at bedside 03/30/2015   - Inter-disciplinary family meet or Palliative Care meeting due by:  8/16.     SUMMARY GRN sepsis ? Gallbladder source - GI opinion pending. Worsening AKI.  Stat lft, trop,lactate rdered. No extubation 03/30/2015 till GI eval complete. Enceph resolved and doing SBT   The patient is critically ill with multiple organ systems failure and requires high complexity decision making for assessment and support,  frequent evaluation and titration of therapies, application of advanced monitoring technologies and extensive interpretation of multiple databases.   Critical Care Time devoted to patient care services described in this note is  45  Minutes. This time reflects time of care of this signee Dr Brand Males. This critical care time does not  reflect procedure time, or teaching time or supervisory time of PA/NP/Med student/Med Resident etc but could involve care discussion time    Dr. Brand Males, M.D., [facility].C.P Pulmonary and Critical Care Medicine Staff Physician [facility] Pager: [phone], If no answer or between  15:00h - 7:00h: call [phone]  03/30/2015 11:17 AM

## 2015-03-30 NOTE — Progress Notes (Signed)
eLink Physician-Brief Progress Note Patient Name: ZALE MARCOTTE DOB: Feb 12, 1927 MRN: [id number]   Date of Service  03/30/2015  HPI/Events of Note  ABG on 40%/PS 5/P 5 = 7.28/29/92/13. NIF = -40. Spontaneous TV = 692 mL.  eICU Interventions  Will order: 1. Extubate to 6 L/min [place] O2. Keep sats > 92%. 2. Incentive Spirometry Q 1 hour while awake.      Intervention Category Major Interventions: Respiratory failure - evaluation and management  Mechel Schutter [place] 03/30/2015, 4:57 PM

## 2015-03-30 NOTE — Progress Notes (Signed)
CRITICAL VALUE ALERT  Critical value received:  Blood Glucose 38  Date of notification:  03/30/2015  Time of notification:  2811  Critical value read back:Yes.    Nurse who received alert:  Therese Sarah  MD notified (1st page):  Hypoglycemia Protocol  Time of first page:  [phone]  MD notified (2nd page):  Time of second page:  Responding MD:  Protocol, N/A  Time MD responded:  [phone]

## 2015-03-30 NOTE — Progress Notes (Addendum)
Daily Rounding Note  03/30/2015, 8:16 AM  LOS: 2 days   SUBJECTIVE:       Pt uncomfortable with NGT and ETT in place.  Denies nausea or abdominal pain.   OBJECTIVE:         Vital signs in last 24 hours:    Temp:  [95.7 F (35.4 C)-98.5 F (36.9 C)] 98.5 F (36.9 C) (08/10 0400) Pulse Rate:  [57-118] 60 (08/10 0700) Resp:  [21-28] 28 (08/10 0700) BP: (92-145)/(53-82) 104/54 mmHg (08/10 0700) SpO2:  [94 %-100 %] 99 % (08/10 0700) FiO2 (%):  [40 %-100 %] 40 % (08/10 0600) Weight:  [174 lb 2.6 oz (79 kg)] 174 lb 2.6 oz (79 kg) (08/09 1600) Last BM Date: 03/29/15 Filed Weights   03/28/15 1050 03/29/15 0200 03/29/15 1600  Weight: 159 lb 2.8 oz (72.2 kg) 171 lb 1.2 oz (77.6 kg) 174 lb 2.6 oz (79 kg)   General: alert, not agitated.  Intubated.   Eyes: Icteric  Heart: RRR Chest: clear bil.  Thick, blood tinged but clear sputum draining from side of mouth.  Abdomen: soft, NT, ND.  BS absent.   Extremities: no CCE.  Feet warm Neuro/Psych:  Moves all 4s, following commands. No tremor.  Appropriate.   Intake/Output from previous day: 08/09 0701 - 08/10 0700 In: 2750 [P.O.:50; I.V.:2400; IV Piggyback:300] Out: 750 [Urine:750]   Lab Results:  Recent Labs  03/29/15 0550 03/29/15 1717 03/30/15 0236  WBC 20.6* 15.1* 14.1*  HGB 10.8* 10.6* 10.2*  HCT 31.4* 32.2* 29.7*  PLT 107* 99* 93*   BMET  Recent Labs  03/29/15 0550 03/29/15 1717 03/30/15 0236  NA 128* 131* 131*  K 5.2* 4.5 3.8  CL 101 102 103  CO2 17* 16* 15*  GLUCOSE 56* 73 38*  BUN 37* 43* 49*  CREATININE 2.08* 2.53* 2.78*  CALCIUM 7.2* 7.4* 7.4*   LFT  Recent Labs  03/28/15 1205 03/29/15 0550 03/30/15 0236  PROT 5.6* 5.2* 4.8*  ALBUMIN 2.9* 2.6* 2.3*  AST 454* 236* 135*  ALT 521* 352* 248*  ALKPHOS 118 98 109  BILITOT 6.1* 6.0* 6.6*  BILIDIR  --   --  4.4*  IBILI  --   --  2.2*   PT/INR Lab Results  Component Value Date   INR 1.86*  03/30/2015   INR 2.59* 03/28/2015   INR 1.13 06/12/2014   CXR and AXR today viewd (with son) and stomach decompressed and ling aeration improved  ASSESMENT:   *  Epigastric pain.  Elevated LFTs.  Sepsis. GRAM NEGATIVE COCCOBACILLI on 1 blood clx, GNR on second blood clx. ? Choledocholithiasis vs contrast filled duodenal diverticulum.  Non-dilated bile ducts. ? Cholangitis.  ? Ischemia of gut? On Azactam, Flagyl, Vancomycin.   WBCs improved.  Transaminases improved.  T bili increased.   *  Large, intrathoracic, HH. On once daily IV Protonix.   *  Acute resp failure, aspiration. Required ETT/vent yesterday afternoon. Rare pairs of GPC on trach aspirate.   *  Hx PAF/flutter. Eliquis discontinued.  Last dose 1040 on 03/29/15.    *  Thrombocytopenia. Acute.   *  AKI.  Baseline stage 3 CKD.   *  Hyponatremia, improved.  Hypomagnesemia.    PLAN   *  Repeat abd ultrasound ordered for today.    *  Vent mgt per CCM   Azucena Freed  03/30/2015, 8:16 AM    GI Attending  I have also seen  and assessed the patient and agree with the advanced practitioner's assessment and plan. My hx and PE same.  After a bit of a confusing presentation here are my thoughts.  He is much better today but still critically ill.  Likely has biliary sepsis but I suspect GB origin. It seems very unlikely that there is a CBD stone though it dopes remain possible original and second radiologist and I do not think radiopaque density is a stone. Also original [place] no ductal dilation. Also probably has an element of schock liver so rising bili wiith declining transaminases fits that.  Plan for repeat US. Cont Abx Wean from vent as you are Start low vol tube feeds - discussed w/ dietitian No plans for endoscopy at present All reviewed with son and patient and previously discussed with Dr. Cresenciano Lick, MD, [facility] [phone] (pager) 03/30/2015 12:34 PM

## 2015-03-30 NOTE — Progress Notes (Signed)
Initial Nutrition Assessment   INTERVENTION:   Initiate Vital AF 1.2 @ 20 ml/hr via NG tube (tip in stomach) and increase by 10 ml every 4 hours to goal rate of 60 ml/hr.   Tube feeding regimen provides 1728 kcal (102% of needs), 108 grams of protein, and 1167 ml of H2O.    NUTRITION DIAGNOSIS:   Inadequate oral intake related to inability to eat as evidenced by NPO status.   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  Pt with severe sepsis and required intubation 8/9. Remains on vent. Spoke with Dr Carlean Purl, ok to start TF.   Patient is currently intubated on ventilator support MV: 12.5 L/min Temp (24hrs), Avg:97.4 F (36.3 C), Min:95.7 F (35.4 C), Max:98.5 F (36.9 C)   Medications reviewed and include: flagyl Labs reviewed: sodium, magnesium low BUN/Cr elevated, CBG's: 47-96 NG placed by Dr Carlean Purl with tip in stomach  Diet Order:    NPO  Skin:  Reviewed, no issues  Last BM:  8/10  Height:   Ht Readings from Last 1 Encounters:  03/29/15 5\' 10"  (1.778 m)    Weight:   Wt Readings from Last 1 Encounters:  03/29/15 174 lb 2.6 oz (79 kg)  Admission weight 72.2 kg   Ideal Body Weight:  75.4 kg  BMI:  Body mass index is 24.99 kg/(m^2).  Estimated Nutritional Needs:   Kcal:  9798  Protein:  90-105 grams  Fluid:  > 1.7 L/day  EDUCATION NEEDS:   No education needs identified at this time  [place], [place], [place] Pager [phone] After Hours Pager'

## 2015-03-31 ENCOUNTER — Inpatient Hospital Stay (HOSPITAL_COMMUNITY): Payer: Medicare PPO

## 2015-03-31 LAB — GLUCOSE, CAPILLARY
GLUCOSE-CAPILLARY: 112 mg/dL — AB (ref 65–99)
GLUCOSE-CAPILLARY: 38 mg/dL — AB (ref 65–99)
GLUCOSE-CAPILLARY: 68 mg/dL (ref 65–99)
Glucose-Capillary: 84 mg/dL (ref 65–99)
Glucose-Capillary: 72 mg/dL (ref 65–99)
Glucose-Capillary: 103 mg/dL — ABNORMAL HIGH (ref 65–99)
Glucose-Capillary: 103 mg/dL — ABNORMAL HIGH (ref 65–99)

## 2015-03-31 LAB — HEPATIC FUNCTION PANEL
ALT: 174 U/L — AB (ref 17–63)
AST: 58 U/L — AB (ref 15–41)
Albumin: 2.4 g/dL — ABNORMAL LOW (ref 3.5–5.0)
Alkaline Phosphatase: 210 U/L — ABNORMAL HIGH (ref 38–126)
BILIRUBIN INDIRECT: 2.5 mg/dL — AB (ref 0.3–0.9)
Bilirubin, Direct: 5.2 mg/dL — ABNORMAL HIGH (ref 0.1–0.5)
TOTAL PROTEIN: 5.4 g/dL — AB (ref 6.5–8.1)
Total Bilirubin: 7.7 mg/dL — ABNORMAL HIGH (ref 0.3–1.2)

## 2015-03-31 LAB — CBC WITH DIFFERENTIAL/PLATELET
BASOS ABS: 0 10*3/uL (ref 0.0–0.1)
Basophils Relative: 0 % (ref 0–1)
EOS ABS: 0 10*3/uL (ref 0.0–0.7)
Eosinophils Relative: 0 % (ref 0–5)
HEMATOCRIT: 28.1 % — AB (ref 39.0–52.0)
Hemoglobin: 9.9 g/dL — ABNORMAL LOW (ref 13.0–17.0)
LYMPHS ABS: 0.4 10*3/uL — AB (ref 0.7–4.0)
Lymphocytes Relative: 2 % — ABNORMAL LOW (ref 12–46)
MCH: 31.3 pg (ref 26.0–34.0)
MCHC: 35.2 g/dL (ref 30.0–36.0)
MCV: 88.9 fL (ref 78.0–100.0)
Monocytes Absolute: 0.9 10*3/uL (ref 0.1–1.0)
Monocytes Relative: 5 % (ref 3–12)
NEUTROS ABS: 16.4 10*3/uL — AB (ref 1.7–7.7)
Neutrophils Relative %: 93 % — ABNORMAL HIGH (ref 43–77)
Platelets: 94 10*3/uL — ABNORMAL LOW (ref 150–400)
RBC: 3.16 MIL/uL — ABNORMAL LOW (ref 4.22–5.81)
RDW: 14.1 % (ref 11.5–15.5)
WBC: 17.7 10*3/uL — ABNORMAL HIGH (ref 4.0–10.5)

## 2015-03-31 LAB — MAGNESIUM
MAGNESIUM: 2.1 mg/dL (ref 1.7–2.4)
Magnesium: 2.1 mg/dL (ref 1.7–2.4)

## 2015-03-31 LAB — LACTIC ACID, PLASMA: LACTIC ACID, VENOUS: 1 mmol/L (ref 0.5–2.0)

## 2015-03-31 LAB — BASIC METABOLIC PANEL
Anion gap: 10 (ref 5–15)
Anion gap: 13 (ref 5–15)
BUN: 50 mg/dL — AB (ref 6–20)
BUN: 50 mg/dL — AB (ref 6–20)
CALCIUM: 7.8 mg/dL — AB (ref 8.9–10.3)
CO2: 17 mmol/L — ABNORMAL LOW (ref 22–32)
CO2: 14 mmol/L — AB (ref 22–32)
CREATININE: 3.35 mg/dL — AB (ref 0.61–1.24)
Calcium: 8.1 mg/dL — ABNORMAL LOW (ref 8.9–10.3)
Chloride: 105 mmol/L (ref 101–111)
Chloride: 103 mmol/L (ref 101–111)
Creatinine, Ser: 3.46 mg/dL — ABNORMAL HIGH (ref 0.61–1.24)
GFR calc Af Amer: 18 mL/min — ABNORMAL LOW (ref >60)
GFR calc Af Amer: 17 mL/min — ABNORMAL LOW (ref >60)
GFR, EST NON AFRICAN AMERICAN: 15 mL/min — AB (ref >60)
GFR, EST NON AFRICAN AMERICAN: 15 mL/min — AB (ref >60)
GLUCOSE: 100 mg/dL — AB (ref 65–99)
GLUCOSE: 103 mg/dL — AB (ref 65–99)
Potassium: 3.3 mmol/L — ABNORMAL LOW (ref 3.5–5.1)
Potassium: 4.7 mmol/L (ref 3.5–5.1)
SODIUM: 132 mmol/L — AB (ref 135–145)
SODIUM: 130 mmol/L — AB (ref 135–145)

## 2015-03-31 LAB — CULTURE, RESPIRATORY W GRAM STAIN

## 2015-03-31 LAB — TROPONIN I: Troponin I: 0.1 ng/mL — ABNORMAL HIGH (ref <0.031)

## 2015-03-31 LAB — PROCALCITONIN: Procalcitonin: 36.28 ng/mL

## 2015-03-31 LAB — PHOSPHORUS: PHOSPHORUS: 3.4 mg/dL (ref 2.5–4.6)

## 2015-03-31 MED ORDER — DEXTROSE 50 % IV SOLN
INTRAVENOUS | Status: AC
  Filled 2015-03-31: qty 50

## 2015-03-31 MED ORDER — PRAMIPEXOLE DIHYDROCHLORIDE 1 MG PO TABS
1.0000 mg | ORAL_TABLET | Freq: Every day | ORAL | Status: DC
  Administered 2015-03-31 – 2015-04-07 (×8): 1 mg via ORAL
  Filled 2015-03-31 (×10): qty 1

## 2015-03-31 MED ORDER — PANTOPRAZOLE SODIUM 40 MG PO TBEC
40.0000 mg | DELAYED_RELEASE_TABLET | Freq: Every day | ORAL | Status: DC
  Administered 2015-03-31 – 2015-04-08 (×9): 40 mg via ORAL
  Filled 2015-03-31 (×9): qty 1

## 2015-03-31 MED ORDER — POTASSIUM CHLORIDE CRYS ER 20 MEQ PO TBCR
40.0000 meq | EXTENDED_RELEASE_TABLET | Freq: Once | ORAL | Status: AC
  Administered 2015-03-31: 40 meq via ORAL
  Filled 2015-03-31: qty 2

## 2015-03-31 MED ORDER — DEXTROSE-NACL 5-0.45 % IV SOLN
INTRAVENOUS | Status: DC
  Administered 2015-03-31: 17:00:00 via INTRAVENOUS

## 2015-03-31 MED ORDER — DEXTROSE 50 % IV SOLN
25.0000 mL | Freq: Once | INTRAVENOUS | Status: AC
  Administered 2015-03-31: 25 mL via INTRAVENOUS
  Filled 2015-03-31: qty 50

## 2015-03-31 MED ORDER — CIPROFLOXACIN IN D5W 400 MG/200ML IV SOLN
400.0000 mg | INTRAVENOUS | Status: DC
  Administered 2015-03-31 – 2015-04-04 (×5): 400 mg via INTRAVENOUS
  Filled 2015-03-31 (×5): qty 200

## 2015-03-31 NOTE — Progress Notes (Signed)
PULMONARY / CRITICAL CARE MEDICINE   Name: [name] MRN: [id number] DOB: 1927/01/05    ADMISSION DATE:  03/28/2015 CONSULTATION DATE:  03/29/15  REFERRING MD :  Dr. 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  He had been seen at an urgent care the day prior for epigastric pain.    On EMS arrival to the home, the patient was found to be hypotensive and hypoglycemic which responded to IVF's and dextrose.  The patient reported of approximately 48 hours of epigastric pain, malaise and fevers.   Initial work up showed temp 101, WBC 22.8, tachycardia, hypotension, AST/ALT 454/521 / bilirubin 6.1, sr cr 2.32, Na 129 and a lactic acid of 4.56.  Initial CXR showed a large hiatal hernia but no edema or consolidation.  An ultrasound of the abdomen was obtained and revealed an abnormal appearance of the gallbladder with contraction, Feeny thickening and nodularity without discrete.  Follow up CT of the abdomen was assessed and showed an large, non-obstructing hiatal hernia with intrathoracic stomach dilatation, colon diverticulosis, peri-nephric stranding and free fluid in the pelvis.  The patient was admitted per [facility].  He was also evaluated by GI and CCS who both feel findings are consistent with cholangitis and sepsis.  The patient was volume resuscitated, treated with abx (aztreonam/vanco in ER, then to levaquin / flagyl), pan cultured and monitored on SDU.  Unfortunately, the afternoon of 8/9, he deteriorated and required emergent intubation with transfer to ICU.  PCCM consulted for evaluation.       STUDIES:/EVENTS 8/08  Admitted with abdominal pain, hypotension. 8/08  [place] ABD >> abnormal appearance of the  gallbladder with contraction, Vint thickening and nodularity, no discrete stones, no other acute abnormality.  CBD 8.5 mm 8/08  CT ABD/Pelvis >> gaseous distention of the stomach, large hiatal hernia, small high attenuation structure in the distal common bile duct (? Choledocholithiasis or small periampullary duodenum diverticulum), non-specific perinephric stranding, small volume free fluid within the pelvis  03/29/15: intubated. Moved to ICU      HISTORY OF PRESENT ILLNESS:   03/30/15: Not on pressors. Remains intubated but following commands. No SBT yet. Repeated transient asymptomatic hypoglycemia per RN. He has denued abd pain to CCS and GI. CCS suspecting CBD stone with obstruction based on LFT but d/w DR Carlean Purl of GI - he reviewed CT with radiology - CBD stone/dilatation presence not convincing. GI  Blood culture growing Gram Negativ Coccobacilli    SUBJECTIVE / Interval events:   VITAL SIGNS: Temp:  [97 F (36.1 C)-97.6 F (36.4 C)] 97.3 F (36.3 C) (08/11 0810) Pulse Rate:  [60-87] 76 (08/11 0900) Resp:  [10-29] 19 (08/11 0900) BP: (93-149)/(56-90) 136/60 mmHg (08/11 0900) SpO2:  [91 %-100 %] 96 % (08/11 0900) FiO2 (%):  [40 %] 40 % (08/10 1600) Weight:  [82.1 kg (181 lb)] 82.1 kg (181 lb) (08/11 0500)   HEMODYNAMICS:     VENTILATOR SETTINGS: Vent Mode:  [-] CPAP;PSV FiO2 (%):  [40 %] 40 % Set Rate:  [14 bmp] 14 bmp Vt Set:  [600 mL] 600 mL PEEP:  [5 cmH20] 5 cmH20 Pressure Support:  [10 cmH20] 10 cmH20   INTAKE / OUTPUT:  Intake/Output Summary (  Last 24 hours) at 03/31/15 8099 Last data filed at 03/31/15 0900  Gross per 24 hour  Intake 2117.83 ml  Output   3165 ml  Net -1047.17 ml    PHYSICAL EXAMINATION: General:  Elderly male, NAD Neuro:  Awake, alert, moves all ext HEENT:  MM pink/moist, Cardiovascular:  s1s2 rrr, no m/r/g Lungs:  resp's even/non-labored, lungs bilaterally coarse, no wheeze Abdomen:  Obese, distended, bsx4 hypoactive  Musculoskeletal:  No  acute deformities  Skin:  Warm/dry, mottled prior to intubation, improved / pink  LABS: PULMONARY  Recent Labs Lab 03/29/15 1425 03/29/15 1635 03/30/15 0437 03/30/15 1619 03/30/15 1633  PHART 6.944* 7.261* 7.405 7.295* 7.284*  PCO2ART 78.3* 30.2* 21.5* 28.6* 29.0*  PO2ART 87.7 241* 94.5 84.0 92.0  HCO3 16.1* 13.1* 13.3* 14.0* 13.9*  TCO2 18.5 14.0 14.0 15 15  O2SAT 91.2 99.3 98.1 96.0 97.0    CBC  Recent Labs Lab 03/29/15 1717 03/30/15 0236 03/31/15 0400  HGB 10.6* 10.2* 9.9*  HCT 32.2* 29.7* 28.1*  WBC 15.1* 14.1* 17.7*  PLT 99* 93* 94*    COAGULATION  Recent Labs Lab 03/28/15 1834 03/30/15 1127  INR 2.59* 1.86*    CARDIAC    Recent Labs Lab 03/29/15 1735 03/30/15 1127 03/30/15 1856 03/31/15 0400  TROPONINI 0.19* 0.19* 0.14* 0.10*   No results for input(s): PROBNP in the last 168 hours.   CHEMISTRY  Recent Labs Lab 03/29/15 0550 03/29/15 1717 03/30/15 0236 03/30/15 2154 03/31/15 0400  NA 128* 131* 131* 132* 132*  K 5.2* 4.5 3.8 3.6 3.3*  CL 101 102 103 105 105  CO2 17* 16* 15* 17* 17*  GLUCOSE 56* 73 38* 83 100*  BUN 37* 43* 49* 50* 50*  CREATININE 2.08* 2.53* 2.78* 3.34* 3.35*  CALCIUM 7.2* 7.4* 7.4* 7.7* 7.8*  MG  --   --  1.5*  --  2.1  PHOS  --   --  3.1  --  3.4   Estimated Creatinine Clearance: 16.3 mL/min (by C-G formula based on Cr of 3.35).   LIVER  Recent Labs Lab 03/28/15 1205 03/28/15 1834 03/29/15 0550 03/30/15 0236 03/30/15 1127  AST 454*  --  236* 129*  135* 109*  ALT 521*  --  352* 234*  248* 221*  ALKPHOS 118  --  98 110  109 132*  BILITOT 6.1*  --  6.0* 6.2*  6.6* 6.5*  PROT 5.6*  --  5.2* 5.1*  4.8* 5.1*  ALBUMIN 2.9*  --  2.6* 2.2*  2.3* 2.3*  INR  --  2.59*  --   --  1.86*     INFECTIOUS  Recent Labs Lab 03/28/15 1834  03/29/15 1611 03/30/15 1127 03/31/15 0312 03/31/15 0400  LATICACIDVEN 3.9*  < > 2.9* 1.7 1.0  --   PROCALCITON 42.82  --   --  50.56  --  36.28  < > = values in  this interval not displayed.   ENDOCRINE CBG (last 3)   Recent Labs  03/31/15 0042 03/31/15 0345 03/31/15 0809  GLUCAP 68 84 72    IMAGING x48h  - image(s) personally visualized Dg Chest Port 1 View  03/30/2015   CLINICAL DATA:  Acute respiratory failure.  EXAM: PORTABLE CHEST - 1 VIEW  COMPARISON:  03/29/2015.  FINDINGS: 1116 hours. Endotracheal tube tip is 3.5 cm above the carina. Nasogastric tube projects below the diaphragm, tip not visualized. There is interval improved aeration of the lung bases with mild residual bibasilar atelectasis. No confluent airspace  opacity, edema or significant pleural effusion. Cardiomegaly and a large hiatal hernia are again noted post CABG.  IMPRESSION: Interval improved aeration of the lung bases. Stable support system.   Electronically Signed   By: Richardean Sale M.D.   On: 03/30/2015 11:44   Dg Chest Port 1 View  03/29/2015   CLINICAL DATA:  Evaluate endotracheal tube position.  EXAM: PORTABLE CHEST - 1 VIEW  COMPARISON:  03/29/2015.  FINDINGS: Support apparatus: Endotracheal tube 3 cm from the carina. Enteric tube is present with the tip not visible. Side port is in the proximal gastric fundus. Monitoring leads project over the chest.  Cardiomediastinal Silhouette:  Unchanged.  Lungs: Bilateral basilar atelectasis, greater on the LEFT when compared to the RIGHT. No pneumothorax.  Effusions: RIGHT-greater-than-LEFT pleural effusions, likely partially loculated on the RIGHT.  Other:  Large hiatal hernia with intrathoracic gastric air bubble.  IMPRESSION: 1. Endotracheal tube tip 3 cm from the carina. 2. Nasogastric tube in the stomach. 3. Cardiomegaly with bilateral pleural effusions. 4. Large hiatal hernia.   Electronically Signed   By: Dereck Ligas M.D.   On: 03/29/2015 18:40   Dg Chest Port 1 View  03/29/2015   CLINICAL DATA:  Intubation  EXAM: PORTABLE CHEST - 1 VIEW  COMPARISON:  03/28/2015  FINDINGS: Endotracheal tube just above the carina.  Recommend withdrawal of 3 cm.  Hypoventilation. Elevated left hemidiaphragm with left lower lobe atelectasis unchanged. Mild right lower lobe atelectasis and small bilateral effusions.  Improved aeration of the lungs compared with earlier today  IMPRESSION: Endotracheal tube at the carina, recommend withdrawal 3 cm.  Improved aeration in the lung bases. Persistent bibasilar atelectasis has improved. Small bilateral pleural effusions.   Electronically Signed   By: Franchot Gallo M.D.   On: 03/29/2015 14:59   Dg Abd Portable 1v  03/30/2015   CLINICAL DATA:  Abnormal LFT  EXAM: PORTABLE ABDOMEN - 1 VIEW  COMPARISON:  03/29/2015  FINDINGS: NG tube in the stomach. Normal bowel gas pattern. Contrast in the rectum from prior CT. Surgical clips in the pelvis.  Lumbar scoliosis and degenerative change.  No renal calculi.  IMPRESSION: Normal bowel gas pattern.  NG tube in the stomach.   Electronically Signed   By: Franchot Gallo M.D.   On: 03/30/2015 11:46   Dg Abd Portable 1v  03/29/2015   CLINICAL DATA:  Check NG tube position  EXAM: PORTABLE ABDOMEN - 1 VIEW  COMPARISON:  03/29/2015 at 1527 hours  FINDINGS: Enteric tube terminates below the diaphragm in the gastric body.  Nonobstructive bowel gas pattern.  Nodule contrast in the distal colon.  IMPRESSION: Enteric tube terminates below the diaphragm in the gastric body.   Electronically Signed   By: Julian Hy M.D.   On: 03/29/2015 16:55   Dg Abd Portable 1v  03/29/2015   CLINICAL DATA:  NG tube placement.  EXAM: PORTABLE ABDOMEN - 1 VIEW  COMPARISON:  CT scan 03/28/2015.  FINDINGS: The NG tube tip is in the lower aspect of the chest. This is likely in the distal esophagus or possibly the large hiatal hernia. The proximal port is in the mid esophagus.  The stomach demonstrates gaseous distention. There is contrast in the bowel.  IMPRESSION: The NG tube tip is in the distal esophagus or large hiatal hernia.   Electronically Signed   By: Marijo Sanes M.D.   On:  03/29/2015 15:55   US Abdomen Limited Ruq  03/31/2015   CLINICAL DATA:  Questionable choledocholithiasis  on recent CT  EXAM: US ABDOMEN LIMITED - RIGHT UPPER QUADRANT  COMPARISON:  Abdominal ultrasound and CT abdomen and pelvis March 28, 2015  FINDINGS: Gallbladder:  The gallbladder is somewhat contracted. There are nonshadowing echogenic foci in the gallbladder similar to 1 day prior. The gallbladder Agard is upper normal in thickness. There is no pericholecystic fluid. No sonographic Murphy sign noted.  Common bile duct:  Diameter: 6 mm. There is no mass or calculus seen in the visualized portions of the common bile duct. Portions of the common bile duct are obscured by gas.  Liver:  No focal lesion identified. Within normal limits in parenchymal echogenicity.  IMPRESSION: Note that the study is less than optimal due to patient's inability to suspend respiration significantly. No choledocholithiasis is seen by ultrasound. Portions of the common bile duct, however, are not well visualized due to the patient's inability to suspend respiration and overlying gas. There is upper normal gallbladder Cavenaugh thickness with several non shadowing echogenic foci in the gallbladder. Question non shadowing gallstones versus small foci of sludge.  MRCP would be the optimal study to further evaluate the common bile duct. If patient becomes able to suspend respiration for a significant period of time, consideration may wish to be given to MRCP for further assessment.   Electronically Signed   By: Lowella Grip III M.D.   On: 03/31/2015 02:25      ASSESSMENT / PLAN:  PULMONARY OETT 8/9 >> 8/10 A: Acute Hypercarbic Respiratory Failure  P:   Push pulm hygiene  PRN albuterol   CARDIOVASCULAR CVL A:  #Baseline  - PAF/AF - on Eliquis Hx HTN, CAD s/p CABG  #AT admit Severe Sepsis - Ecoli bacteremia - Source ? Gallbladder vs bowel translocation  P:  ICU monitoring  Change IVF to d5 0.45 NS at 50cc/h Hold  Eliquis and Hold home norvasc, HCTZ, lisinopril, toprol Assess ECHO  - results pending Repeat troponin   RENAL A:   Metabolic / Lactic Acidosis - in setting of sepsis  Acute on Chronic CKD - baseline sr cr ~ 1.3  P:   Trend BMP / UOP, anticipate renal recovery as UOP increasing Replace electrolytes as indicated   GASTROINTESTINAL A:   Large Hiatal Hernia  ? Transient biliary obstruction and cholecystitis Possible bowel bacterial translocation, E coli P:   PPI for SUP, change to PO when tolerating diet OK to start clear diet on 8/11 GI / CCS Following; discussed case with Dr Carlean Purl on 8/11 Trend LFT's  HEMATOLOGIC A:   Anemia  Thrombocytopenia - in setting of severe sepsis  Elevated PT P:  Trend CBC  SCD's for DVT Monitor PT Hold Eliquis for now   INFECTIOUS A:   Severe Sepsis / E coli Bacteremia - in setting of suspected abdominal source infection, ? Cholecystitis  P:   BCx2 8/8 >> E coli (R unasyn, I zosyn) UC 8/8 >> negative Sputum 8/9 >> normal flora  Levaquin, 8/8 >> 8/9 Flagyl 8/8 >> 8/11 Azactam 8/8 >> 8/11 Vanco 8/9 >> 8/11 cipro 8/11 >>   No clear indication for ERCP or GB drainage at this time.   ENDOCRINE A:   Hypoglycemia  P:   Follow CBG d5 0.45 NS  NEUROLOGIC A:   Acute Metabolic Encephalopathy - in setting of severe sepsis, improved P:   RASS goal: 0 Fentanyl PRN for pain   FAMILY  - Updates:  Koleton Duchemin - son, [phone]. Updated at bedside 03/30/15  - Inter-disciplinary family meet  or Palliative Care meeting due by:  8/16.     Independent CC time 35 minutes  Baltazar Apo, MD, PhD 03/31/2015, 9:31 AM [facility] [phone] or if no answer [phone]

## 2015-03-31 NOTE — Progress Notes (Addendum)
Daily Rounding Note  03/31/2015, 8:17 AM  LOS: 3 days   SUBJECTIVE:       Extubated at 1700 yesterday.   OBJECTIVE:         Vital signs in last 24 hours:    Temp:  [97 F (36.1 C)-98 F (36.7 C)] 97.3 F (36.3 C) (08/11 0810) Pulse Rate:  [60-87] 65 (08/11 0600) Resp:  [11-29] 11 (08/11 0600) BP: (93-149)/(56-90) 131/66 mmHg (08/11 0600) SpO2:  [91 %-100 %] 99 % (08/11 0600) FiO2 (%):  [40 %] 40 % (08/10 1600) Weight:  [181 lb (82.1 kg)] 181 lb (82.1 kg) (08/11 0500) Last BM Date: 03/29/15 Filed Weights   03/29/15 0200 03/29/15 1600 03/31/15 0500  Weight: 171 lb 1.2 oz (77.6 kg) 174 lb 2.6 oz (79 kg) 181 lb (82.1 kg)   General: looks weak and ill but not toxic   Heart: RRR. Chest: still with ronchi in bases and overall reduced BS Abdomen: soft, NT, active BS, ND  Extremities: no CCE.  Feet warm Neuro/Psych:  Oriented x 3.  Appropriate.  Moves all 4s.  No gross deficits.   Intake/Output from previous day: 08/10 0701 - 08/11 0700 In: 2407.8 [I.V.:707.8; IV Piggyback:1700] Out: 6767 [Urine:3225]      Lab Results:  Recent Labs  03/29/15 1717 03/30/15 0236 03/31/15 0400  WBC 15.1* 14.1* 17.7*  HGB 10.6* 10.2* 9.9*  HCT 32.2* 29.7* 28.1*  PLT 99* 93* 94*   BMET  Recent Labs  03/30/15 0236 03/30/15 2154 03/31/15 0400  NA 131* 132* 132*  K 3.8 3.6 3.3*  CL 103 105 105  CO2 15* 17* 17*  GLUCOSE 38* 83 100*  BUN 49* 50* 50*  CREATININE 2.78* 3.34* 3.35*  CALCIUM 7.4* 7.7* 7.8*     Recent Labs  03/28/15 1834 03/30/15 1127  LABPROT 27.4* 21.3*  INR 2.59* 1.86*      Studies/Results:   US Abdomen Limited Ruq 03/31/2015   COMPARISON:  Abdominal ultrasound and CT abdomen and pelvis March 28, 2015  FINDINGS: Gallbladder:  The gallbladder is somewhat contracted. There are nonshadowing echogenic foci in the gallbladder similar to 1 day prior. The gallbladder Champney is upper normal in thickness.  There is no pericholecystic fluid. No sonographic Murphy sign noted.  Common bile duct:  Diameter: 6 mm. There is no mass or calculus seen in the visualized portions of the common bile duct. Portions of the common bile duct are obscured by gas.  Liver:  No focal lesion identified. Within normal limits in parenchymal echogenicity.  IMPRESSION: Note that the study is less than optimal due to patient's inability to suspend respiration significantly. No choledocholithiasis is seen by ultrasound. Portions of the common bile duct, however, are not well visualized due to the patient's inability to suspend respiration and overlying gas. There is upper normal gallbladder Schaben thickness with several non shadowing echogenic foci in the gallbladder. Question non shadowing gallstones versus small foci of sludge.  MRCP would be the optimal study to further evaluate the common bile duct. If patient becomes able to suspend respiration for a significant period of time, consideration may wish to be given to MRCP for further assessment.   Electronically Signed   By: Lowella Grip III M.D.   On: 03/31/2015 02:25    ASSESMENT:   * Epigastric pain. Elevated LFTs. Sepsis.  E coli bacteremia (on 1, GNR on second).  ? Choledocholithiasis vs contrast filled duodenal diverticulum. Yesterday's ultrasound #  2 with no obvious CBD stone but suboptimal study and sludge vs non-shadowing gallstones but no cholecystitis. Non-dilated bile ducts. ? Cholangitis. ? Ischemia of gut? Abx adjusted to Cipro only.  WBCs rose last 24 hours but overall improved. LFTs pending.  Do not think pt would be able to hold his breath long enough for optimal MRCP.   * Large, intrathoracic, HH. On once daily IV Protonix.   * Acute resp failure, aspiration. Required ETT/vent yesterday afternoon. Rare pairs of GPC on trach aspirate. On vent 8/9 - 8/10.   *  Elevated Troponins. Max 0.19.  2 D echo 03/30/15: LVEF 55 to 65%. Grade 2 diastolic  dysfunction. Mild to moderate multi-valvular regurgitation.  Moderated pulmonary htn.   * Hx PAF/flutter. Eliquis discontinued. Last dose 1040 on 03/29/15.   * Thrombocytopenia. Acute, stable. .   * AKI. Baseline stage 3 CKD.   * Hyponatremia, hypokalemia, hypomagnesemia.     PLAN   *  Remove NGT.  Restart on po Protonix.  Continue abx.  *  Allow full liquids and advance as tolerated.  Upright for all po.     Azucena Freed  03/31/2015, 8:17 AM Pager: [phone]  [facility] Attending  I have also seen and assessed the patient and agree with the advanced practitioner's assessment and plan. My Hx and PE same.  My index of suspicion of CBD stone low at this point and would not try for an MRCP but might make sense when respiratory status better. He is improving overall and would continue Abx and support. His creatinine is rising and I think he has had shock kidney and shock liver, he has cholelithiasis and we have not definitively R/O cbd stone(s) Will still follow for now.  Gatha Mayer, MD, [facility] [phone] (pager) 03/31/2015 11:25 AM

## 2015-03-31 NOTE — Progress Notes (Signed)
eLink Physician-Brief Progress Note Patient Name: [name] DOB: 01/27/1927 MRN: [id number]   Date of Service  03/31/2015  HPI/Events of Note  Back in AFIB with ventricular response of 110's to 140's. Patient has a Hx of PAF. Currently on Cardizem PO.   eICU Interventions  Will check a BMP and Mg++ level now.      Intervention Category Intermediate Interventions: Arrhythmia - evaluation and management  Sommer,Steven Eugene 03/31/2015, 5:18 PM

## 2015-03-31 NOTE — Progress Notes (Signed)
Pt Blood Sugar 68. Noted day shift treated blood sugar with D50 x2.  MD called, notified of previous events and current medications. Orders received. Pt updated. Will continue to monitor.

## 2015-03-31 NOTE — Progress Notes (Signed)
eLink Physician-Brief Progress Note Patient Name: [name] DOB: [birth year] MRN: [id number]   Date of Service  03/31/2015  HPI/Events of Note  Patient request home Mirapex 1 mg PO Q HS for RLS.  eICU Interventions  Will order Mirapex 1 mg PO Q HS.     Intervention Category Minor Interventions: Routine modifications to care plan (e.g. PRN medications for pain, fever)  [name] 03/31/2015, 8:18 PM

## 2015-03-31 NOTE — Progress Notes (Signed)
Patient ID: [name], male   DOB: September 23, 1926, 81 y.o.   MRN: [id number]    Subjective: Pt extubated yesterday.  No pain today.  Hungry.  Objective: Vital signs in last 24 hours: Temp:  [97 F (36.1 C)-97.6 F (36.4 C)] 97.3 F (36.3 C) (08/11 0810) Pulse Rate:  [60-87] 76 (08/11 0900) Resp:  [10-26] 19 (08/11 0900) BP: (93-149)/(56-90) 136/60 mmHg (08/11 0900) SpO2:  [91 %-100 %] 96 % (08/11 0900) FiO2 (%):  [40 %] 40 % (08/10 1600) Weight:  [82.1 kg (181 lb)] 82.1 kg (181 lb) (08/11 0500) Last BM Date: 03/29/15  Intake/Output from previous day: 08/10 0701 - 08/11 0700 In: 2427.8 [I.V.:727.8; IV Piggyback:1700] Out: 3570 [Urine:3225] Intake/Output this shift: Total I/O In: 40 [I.V.:40] Out: -   PE: Abd: soft, NT, ND, +BS Heart: regular Lungs: CTAB  Lab Results:   Recent Labs  03/30/15 0236 03/31/15 0400  WBC 14.1* 17.7*  HGB 10.2* 9.9*  HCT 29.7* 28.1*  PLT 93* 94*   BMET  Recent Labs  03/30/15 2154 03/31/15 0400  NA 132* 132*  K 3.6 3.3*  CL 105 105  CO2 17* 17*  GLUCOSE 83 100*  BUN 50* 50*  CREATININE 3.34* 3.35*  CALCIUM 7.7* 7.8*   PT/INR  Recent Labs  03/28/15 1834 03/30/15 1127  LABPROT 27.4* 21.3*  INR 2.59* 1.86*   CMP     Component Value Date/Time   NA 132* 03/31/2015 0400   NA 138 12/28/2014 0952   K 3.3* 03/31/2015 0400   CL 105 03/31/2015 0400   CO2 17* 03/31/2015 0400   GLUCOSE 100* 03/31/2015 0400   GLUCOSE 84 12/28/2014 0952   BUN 50* 03/31/2015 0400   BUN 22 12/28/2014 0952   CREATININE 3.35* 03/31/2015 0400   CREATININE 1.33 02/17/2013 1108   CALCIUM 7.8* 03/31/2015 0400   PROT 5.1* 03/30/2015 1127   PROT 6.8 12/28/2014 0952   ALBUMIN 2.3* 03/30/2015 1127   AST 109* 03/30/2015 1127   ALT 221* 03/30/2015 1127   ALKPHOS 132* 03/30/2015 1127   BILITOT 6.5* 03/30/2015 1127   BILITOT 0.8 12/28/2014 0952   GFRNONAA 15* 03/31/2015 0400   GFRNONAA 49* 02/17/2013 1108   GFRAA 18* 03/31/2015 0400   GFRAA 56*  02/17/2013 1108   Lipase     Component Value Date/Time   LIPASE 12* 03/28/2015 1205       Studies/Results: Dg Chest Port 1 View  03/30/2015   CLINICAL DATA:  Acute respiratory failure.  EXAM: PORTABLE CHEST - 1 VIEW  COMPARISON:  03/29/2015.  FINDINGS: 1116 hours. Endotracheal tube tip is 3.5 cm above the carina. Nasogastric tube projects below the diaphragm, tip not visualized. There is interval improved aeration of the lung bases with mild residual bibasilar atelectasis. No confluent airspace opacity, edema or significant pleural effusion. Cardiomegaly and a large hiatal hernia are again noted post CABG.  IMPRESSION: Interval improved aeration of the lung bases. Stable support system.   Electronically Signed   By: Richardean Sale M.D.   On: 03/30/2015 11:44   Dg Chest Port 1 View  03/29/2015   CLINICAL DATA:  Evaluate endotracheal tube position.  EXAM: PORTABLE CHEST - 1 VIEW  COMPARISON:  03/29/2015.  FINDINGS: Support apparatus: Endotracheal tube 3 cm from the carina. Enteric tube is present with the tip not visible. Side port is in the proximal gastric fundus. Monitoring leads project over the chest.  Cardiomediastinal Silhouette:  Unchanged.  Lungs: Bilateral basilar atelectasis, greater on the LEFT  when compared to the RIGHT. No pneumothorax.  Effusions: RIGHT-greater-than-LEFT pleural effusions, likely partially loculated on the RIGHT.  Other:  Large hiatal hernia with intrathoracic gastric air bubble.  IMPRESSION: 1. Endotracheal tube tip 3 cm from the carina. 2. Nasogastric tube in the stomach. 3. Cardiomegaly with bilateral pleural effusions. 4. Large hiatal hernia.   Electronically Signed   By: Dereck Ligas M.D.   On: 03/29/2015 18:40   Dg Chest Port 1 View  03/29/2015   CLINICAL DATA:  Intubation  EXAM: PORTABLE CHEST - 1 VIEW  COMPARISON:  03/28/2015  FINDINGS: Endotracheal tube just above the carina. Recommend withdrawal of 3 cm.  Hypoventilation. Elevated left hemidiaphragm with  left lower lobe atelectasis unchanged. Mild right lower lobe atelectasis and small bilateral effusions.  Improved aeration of the lungs compared with earlier today  IMPRESSION: Endotracheal tube at the carina, recommend withdrawal 3 cm.  Improved aeration in the lung bases. Persistent bibasilar atelectasis has improved. Small bilateral pleural effusions.   Electronically Signed   By: Franchot Gallo M.D.   On: 03/29/2015 14:59   Dg Abd Portable 1v  03/30/2015   CLINICAL DATA:  Abnormal LFT  EXAM: PORTABLE ABDOMEN - 1 VIEW  COMPARISON:  03/29/2015  FINDINGS: NG tube in the stomach. Normal bowel gas pattern. Contrast in the rectum from prior CT. Surgical clips in the pelvis.  Lumbar scoliosis and degenerative change.  No renal calculi.  IMPRESSION: Normal bowel gas pattern.  NG tube in the stomach.   Electronically Signed   By: Franchot Gallo M.D.   On: 03/30/2015 11:46   Dg Abd Portable 1v  03/29/2015   CLINICAL DATA:  Check NG tube position  EXAM: PORTABLE ABDOMEN - 1 VIEW  COMPARISON:  03/29/2015 at 1527 hours  FINDINGS: Enteric tube terminates below the diaphragm in the gastric body.  Nonobstructive bowel gas pattern.  Nodule contrast in the distal colon.  IMPRESSION: Enteric tube terminates below the diaphragm in the gastric body.   Electronically Signed   By: Julian Hy M.D.   On: 03/29/2015 16:55   Dg Abd Portable 1v  03/29/2015   CLINICAL DATA:  NG tube placement.  EXAM: PORTABLE ABDOMEN - 1 VIEW  COMPARISON:  CT scan 03/28/2015.  FINDINGS: The NG tube tip is in the lower aspect of the chest. This is likely in the distal esophagus or possibly the large hiatal hernia. The proximal port is in the mid esophagus.  The stomach demonstrates gaseous distention. There is contrast in the bowel.  IMPRESSION: The NG tube tip is in the distal esophagus or large hiatal hernia.   Electronically Signed   By: Marijo Sanes M.D.   On: 03/29/2015 15:55   US Abdomen Limited Ruq  03/31/2015   CLINICAL DATA:   Questionable choledocholithiasis on recent CT  EXAM: US ABDOMEN LIMITED - RIGHT UPPER QUADRANT  COMPARISON:  Abdominal ultrasound and CT abdomen and pelvis March 28, 2015  FINDINGS: Gallbladder:  The gallbladder is somewhat contracted. There are nonshadowing echogenic foci in the gallbladder similar to 1 day prior. The gallbladder Hinojosa is upper normal in thickness. There is no pericholecystic fluid. No sonographic Murphy sign noted.  Common bile duct:  Diameter: 6 mm. There is no mass or calculus seen in the visualized portions of the common bile duct. Portions of the common bile duct are obscured by gas.  Liver:  No focal lesion identified. Within normal limits in parenchymal echogenicity.  IMPRESSION: Note that the study is less than optimal  due to patient's inability to suspend respiration significantly. No choledocholithiasis is seen by ultrasound. Portions of the common bile duct, however, are not well visualized due to the patient's inability to suspend respiration and overlying gas. There is upper normal gallbladder Cangel[place] thickness with several non shadowing echogenic foci in the gallbladder. Question non shadowing gallstones versus small foci of sludge.  MRCP would be the optimal study to further evaluate the common bile duct. If patient becomes able to suspend respiration for a significant period of time, consideration may wish to be given to MRCP for further assessment.   Electronically Signed   By: Lowella Grip III M.D.   On: 03/31/2015 02:25    Anti-infectives: Anti-infectives    Start     Dose/Rate Route Frequency Ordered Stop   03/31/15 1000  ciprofloxacin (CIPRO) IVPB 400 mg     400 mg 200 mL/hr over 60 Minutes Intravenous Every 24 hours 03/31/15 0933     03/30/15 1400  vancomycin (VANCOCIN) IVPB 1000 mg/200 mL premix  Status:  Discontinued     1,000 mg 200 mL/hr over 60 Minutes Intravenous Every 48 hours 03/30/15 0751 03/31/15 0933   03/30/15 1300  levofloxacin (LEVAQUIN) IVPB 500 mg   Status:  Discontinued     500 mg 100 mL/hr over 60 Minutes Intravenous Every 48 hours 03/28/15 1407 03/28/15 1645   03/30/15 1230  levofloxacin (LEVAQUIN) IVPB 500 mg  Status:  Discontinued     500 mg 100 mL/hr over 60 Minutes Intravenous Every 48 hours 03/28/15 1707 03/29/15 1619   03/29/15 1500  vancomycin (VANCOCIN) IVPB 750 mg/150 ml premix  Status:  Discontinued     750 mg 150 mL/hr over 60 Minutes Intravenous Every 24 hours 03/28/15 1407 03/28/15 1645   03/29/15 1400  vancomycin (VANCOCIN) IVPB 750 mg/150 ml premix  Status:  Discontinued     750 mg 150 mL/hr over 60 Minutes Intravenous Every 24 hours 03/28/15 1713 03/30/15 0750   03/29/15 1230  levofloxacin (LEVAQUIN) IVPB 750 mg  Status:  Discontinued     750 mg 100 mL/hr over 90 Minutes Intravenous  Once 03/28/15 1656 03/28/15 1707   03/28/15 2100  aztreonam (AZACTAM) 1 g in dextrose 5 % 50 mL IVPB  Status:  Discontinued     1 g 100 mL/hr over 30 Minutes Intravenous Every 8 hours 03/28/15 1407 03/28/15 1645   03/28/15 2100  aztreonam (AZACTAM) 1 g in dextrose 5 % 50 mL IVPB  Status:  Discontinued     1 g 100 mL/hr over 30 Minutes Intravenous 3 times per day 03/28/15 1713 03/31/15 0933   03/28/15 1700  levofloxacin (LEVAQUIN) IVPB 750 mg  Status:  Discontinued     750 mg 100 mL/hr over 90 Minutes Intravenous  Once 03/28/15 1645 03/28/15 1656   03/28/15 1700  metroNIDAZOLE (FLAGYL) IVPB 500 mg  Status:  Discontinued     500 mg 100 mL/hr over 60 Minutes Intravenous  Once 03/28/15 1645 03/28/15 1658   03/28/15 1700  metroNIDAZOLE (FLAGYL) IVPB 500 mg  Status:  Discontinued     500 mg 100 mL/hr over 60 Minutes Intravenous Every 8 hours 03/28/15 1658 03/31/15 0940   03/28/15 1245  vancomycin (VANCOCIN) 1,500 mg in sodium chloride 0.9 % 500 mL IVPB     1,500 mg 250 mL/hr over 120 Minutes Intravenous STAT 03/28/15 1235 03/28/15 1649   03/28/15 1230  levofloxacin (LEVAQUIN) IVPB 750 mg     750 mg 100 mL/hr over 90 Minutes  Intravenous  Once 03/28/15 1228 03/28/15 1440   03/28/15 1230  aztreonam (AZACTAM) 2 g in dextrose 5 % 50 mL IVPB     2 g 100 mL/hr over 30 Minutes Intravenous  Once 03/28/15 1228 03/28/15 1354   03/28/15 1230  vancomycin (VANCOCIN) IVPB 1000 mg/200 mL premix  Status:  Discontinued     1,000 mg 200 mL/hr over 60 Minutes Intravenous  Once 03/28/15 1228 03/28/15 1234       Assessment/Plan   1. Abdominal pain/elevated LFTs -repeat US shows some gallstones, but still a contracted gallbladder and no pericholecystic fluid or evidence of cholecystitis.  -LFTs are pending today, TB and alk phos were increasing yesterday with a direct bilirubinemia -I discussed the patient and his imaging, labs, etc with IR today.  Given his gallbladder is contracted, he feels this in and of itself goes against cholecystitis.  He feels that given his elevated TB, a HIDA scan will likely give a false positive.  He does not feel that a perc chole drain is indicated in this patient.   -radiologist also concerned about patient's ability to complete MRCP given he had trouble with respirations just with [place]. -given no acute evidence for cholecystitis and his respiratory failure, etc we would not plan on a lap chole this admission.  He will need this at some point in the future though, given his stones and these events this admission. -follow LFTs -agree with clear liquids. -will follow   LOS: 3 days    Hadas Jessop E 03/31/2015, 10:18 AM Pager: [phone]

## 2015-03-31 NOTE — Progress Notes (Signed)
Pt had incontinent episode, while the tech and I were cleaning we noticed a fluid pocket under penis. Pt stated not tender. Upon rechecking it an hour later I noticed the fluid pocket seemed bigger. Notified MD since was something new. Pt Foley was discontinue this morning around 1130. Pt is voiding and still stating that its non tender. Will continue to monitor

## 2015-04-01 DIAGNOSIS — R945 Abnormal results of liver function studies: Secondary | ICD-10-CM | POA: Insufficient documentation

## 2015-04-01 DIAGNOSIS — R7989 Other specified abnormal findings of blood chemistry: Secondary | ICD-10-CM

## 2015-04-01 DIAGNOSIS — K8309 Other cholangitis: Secondary | ICD-10-CM | POA: Insufficient documentation

## 2015-04-01 LAB — COMPREHENSIVE METABOLIC PANEL
ALBUMIN: 2.2 g/dL — AB (ref 3.5–5.0)
ALK PHOS: 210 U/L — AB (ref 38–126)
ALT: 135 U/L — ABNORMAL HIGH (ref 17–63)
AST: 41 U/L (ref 15–41)
Anion gap: 8 (ref 5–15)
BILIRUBIN TOTAL: 8.2 mg/dL — AB (ref 0.3–1.2)
BUN: 46 mg/dL — ABNORMAL HIGH (ref 6–20)
CHLORIDE: 107 mmol/L (ref 101–111)
CO2: 18 mmol/L — ABNORMAL LOW (ref 22–32)
Calcium: 8.4 mg/dL — ABNORMAL LOW (ref 8.9–10.3)
Creatinine, Ser: 3.61 mg/dL — ABNORMAL HIGH (ref 0.61–1.24)
GFR calc Af Amer: 16 mL/min — ABNORMAL LOW (ref >60)
GFR calc non Af Amer: 14 mL/min — ABNORMAL LOW (ref >60)
Glucose, Bld: 84 mg/dL (ref 65–99)
POTASSIUM: 3.4 mmol/L — AB (ref 3.5–5.1)
Sodium: 133 mmol/L — ABNORMAL LOW (ref 135–145)
TOTAL PROTEIN: 5 g/dL — AB (ref 6.5–8.1)

## 2015-04-01 LAB — POCT I-STAT 3, ART BLOOD GAS (G3+)
ACID-BASE DEFICIT: 8 mmol/L — AB (ref 0.0–2.0)
BICARBONATE: 17.1 meq/L — AB (ref 20.0–24.0)
O2 SAT: 96 %
Patient temperature: 97.5
TCO2: 18 mmol/L (ref 0–100)
pCO2 arterial: 31.3 mmHg — ABNORMAL LOW (ref 35.0–45.0)
pH, Arterial: 7.342 — ABNORMAL LOW (ref 7.350–7.450)
pO2, Arterial: 86 mmHg (ref 80.0–100.0)

## 2015-04-01 LAB — PHOSPHORUS: Phosphorus: 3.3 mg/dL (ref 2.5–4.6)

## 2015-04-01 LAB — CBC
HCT: 28.4 % — ABNORMAL LOW (ref 39.0–52.0)
HEMOGLOBIN: 10.2 g/dL — AB (ref 13.0–17.0)
MCH: 31.7 pg (ref 26.0–34.0)
MCHC: 35.9 g/dL (ref 30.0–36.0)
MCV: 88.2 fL (ref 78.0–100.0)
Platelets: 86 10*3/uL — ABNORMAL LOW (ref 150–400)
RBC: 3.22 MIL/uL — AB (ref 4.22–5.81)
RDW: 14.3 % (ref 11.5–15.5)
WBC: 13.7 10*3/uL — ABNORMAL HIGH (ref 4.0–10.5)

## 2015-04-01 LAB — PROCALCITONIN: Procalcitonin: 19.9 ng/mL

## 2015-04-01 MED ORDER — ENSURE ENLIVE PO LIQD
237.0000 mL | Freq: Two times a day (BID) | ORAL | Status: DC
  Administered 2015-04-01 – 2015-04-08 (×7): 237 mL via ORAL

## 2015-04-01 NOTE — Progress Notes (Signed)
Daily Rounding Note  04/01/2015, 8:52 AM  LOS: 4 days   SUBJECTIVE:       Recurrent a fib this AM, rate in 110s to 140s.  Feels SOB.  Not much coughing. No pain meds used since 8/9 (then Fentanyl for agitation while on vent).  Last BM was 8/11.    OBJECTIVE:         Vital signs in last 24 hours:    Temp:  [97.4 F (36.3 C)-97.8 F (36.6 C)] 97.5 F (36.4 C) (08/12 0800) Pulse Rate:  [57-151] 57 (08/12 0600) Resp:  [10-21] 11 (08/12 0600) BP: (109-159)/(56-87) 120/62 mmHg (08/12 0600) SpO2:  [93 %-100 %] 100 % (08/12 0600) Weight:  [176 lb 5.9 oz (80 kg)] 176 lb 5.9 oz (80 kg) (08/12 0600) Last BM Date: 03/29/15 Filed Weights   03/29/15 1600 03/31/15 0500 04/01/15 0600  Weight: 174 lb 2.6 oz (79 kg) 181 lb (82.1 kg) 176 lb 5.9 oz (80 kg)   General: comfortable.  Non-toxic but looks unwell and frail   Heart: irreg, irreg Chest: some fine crackles in R>L bases.  Ineffective cough. Abdomen: soft,  NT, ND.  BS present.   Extremities: no CCE Neuro/Psych:  Oriented x 3.  Psychomotor slowing.   Intake/Output from previous day: 08/11 0701 - 08/12 0700 In: 1840 [I.V.:990] Out: 1000 [Urine:1000]  Intake/Output this shift: Total I/O In: 50 [I.V.:50] Out: 200 [Urine:200]  Lab Results:  Recent Labs  03/30/15 0236 03/31/15 0400 04/01/15 0227  WBC 14.1* 17.7* 13.7*  HGB 10.2* 9.9* 10.2*  HCT 29.7* 28.1* 28.4*  PLT 93* 94* 86*   BMET  Recent Labs  03/31/15 0400 03/31/15 1845 04/01/15 0227  NA 132* 130* 133*  K 3.3* 4.7 3.4*  CL 105 103 107  CO2 17* 14* 18*  GLUCOSE 100* 103* 84  BUN 50* 50* 46*  CREATININE 3.35* 3.46* 3.61*  CALCIUM 7.8* 8.1* 8.4*   LFT  Recent Labs  03/30/15 0236 03/30/15 1127 03/31/15 1215 04/01/15 0227  PROT 5.1*  4.8* 5.1* 5.4* 5.0*  ALBUMIN 2.2*  2.3* 2.3* 2.4* 2.2*  AST 129*  135* 109* 58* 41  ALT 234*  248* 221* 174* 135*  ALKPHOS 110  109 132* 210* 210*  BILITOT  6.2*  6.6* 6.5* 7.7* 8.2*  BILIDIR 4.1*  4.4* 4.2* 5.2*  --   IBILI 2.1*  2.2* 2.3* 2.5*  --        ASSESMENT:   * Epigastric pain. Elevated LFTs. Sepsis. E coli bacteremia (2/2 blood clx)Day 5 abx, on Cipro.  WBCs improved. LFTs improving except rising T bili. probably had biliary sepsis  * Suspected Shock liver  * Large, intrathoracic, HH. On once daily po Protonix.   * Acute resp failure, aspiration. Required ETT/vent 8/9 - 8/10.  On vent 8/9 - 8/10.   * Elevated Troponins. Max 0.19. 2 D echo 03/30/15: LVEF 55 to 65%. Grade 2 diastolic dysfunction. Mild to moderate multi-valvular regurgitation. Moderated pulmonary htn.   * Hx PAF/flutter. Eliquis discontinued. Last dose 1040 on 03/29/15.   * Thrombocytopenia. Acute.  * AKI. Progressive. Baseline stage 3 CKD.   * Hyponatremia, hypokalemia, hypomagnesemia.    PLAN    *  Advance to [facility] diet.  CMET in AM.    Azucena Freed  04/01/2015, 8:52 AM Pager: [phone]  [facility] Attending  I have also seen and assessed the patient and agree with the advanced practitioner's assessment and plan.  My Hx and PE same  He is overall better but has rising creat and bili  Regarding the bili - I favor it is post shock liver but cannot r/o CBD stone.  If bili fails to start to go down then can do an MR/MRCP liver w/o contrast. I tested him and he seemed able to hold breath x 10 - 15 sec.  No GI contraindication to heparin for AFib or other anticoagulation  Gatha Mayer, MD, [facility] [phone] (pager) 04/01/2015 3:04 PM

## 2015-04-01 NOTE — Consult Note (Signed)
[name] is an 81 y.o. male referred by dr Chase Caller   Chief Complaint: ARF HPI: 81yo WM admitted 03/28/15 with fevers and hypotension found to have E coli sepsis possibly sec to cholecystitis.  Baseline Scr 1.3 in 5/16.  On admission Scr 2.3 and has increased to 3.6 as of today.  SBP 70-80's on admission and was on lisinopril PTA.  UO 3.2L on 8/10 and 1 L yest.  Abd US shows no hydro.  He was on lisinopril PTA.  BP has improved with volume and he is now extubated.  Past Medical History  Diagnosis Date  . Prostate cancer   . GERD (gastroesophageal reflux disease)   . Hypertension   . Hyperlipemia   . Arthritis   . Inguinal hernia     left  . Coronary artery disease   . Dysrhythmia   . Shortness of breath   . Pneumonia   . H/O hiatal hernia   . CKD (chronic kidney disease) 08/2013    stage 3    Past Surgical History  Procedure Laterality Date  . Hernia repair    . Prostate surgery    . Colonoscopy  2003, 01/2012    non-bleeding cecal AVMs, descending and sigmoid tics.   . Coronary artery bypass graft N/A 09/03/2013    Procedure: CORONARY ARTERY BYPASS GRAFTING times four using left internal mammary and bilateral saphenous vein.;  Surgeon: Ivin Poot, MD;  Location: [place];  Service: Open Heart Surgery;  Laterality: N/A;  . Intraoperative transesophageal echocardiogram N/A 09/03/2013    Procedure: INTRAOPERATIVE TRANSESOPHAGEAL ECHOCARDIOGRAM;  Surgeon: Ivin Poot, MD;  Location: [place];  Service: Open Heart Surgery;  Laterality: N/A;  . Coronary angioplasty    . Cataract extraction Bilateral   . Total knee arthroplasty Right 03/11/2014    Procedure: TOTAL KNEE ARTHROPLASTY;  Surgeon: Hessie Dibble, MD;  Location: [place];  Service: Orthopedics;  Laterality: Right;  . Left heart catheterization with coronary angiogram N/A 08/03/2013    Procedure: LEFT HEART CATHETERIZATION WITH CORONARY ANGIOGRAM;  Surgeon: Blane Ohara, MD;  Location[facility] C[facility];  Service:  Cardiovascular;  Laterality: N/A;  . Right thoracotomy  As a young child, ? 37 years old    for pneumonia   . Esophagogastroduodenoscopy  01/2012    11 cm sliding hiatal hernia.    Family History  Problem Relation Age of Onset  . Colon cancer Neg Hx   . Cancer Mother     bone marrow  No FH renal ds  Social History:  reports that he quit smoking about 36 years ago. His smoking use included Cigarettes. He smoked 1.00 pack per day. He has never used smokeless tobacco. He reports that he does not drink alcohol or use illicit drugs. Lives by self in [place]  Allergies:  Allergies  Allergen Reactions  . Lipitor [Atorvastatin] Other (See Comments)    "feel bad"  . Penicillins Itching    Medications Prior to Admission  Medication Sig Dispense Refill  . amLODipine (NORVASC) 5 MG tablet Take 1 tablet (5 mg total) by mouth daily. 90 tablet 1  . apixaban (ELIQUIS) 2.5 MG TABS tablet Take 1 tablet (2.5 mg total) by mouth 2 (two) times daily. 60 tablet 6  . Cholecalciferol (VITAMIN D3) 1000 UNITS CAPS Take 1,000 Units by mouth daily.     . hydrochlorothiazide (HYDRODIURIL) 25 MG tablet Take 0.5 tablets (12.5 mg total) by mouth daily. 90 tablet 3  . levothyroxine (SYNTHROID, LEVOTHROID)  25 MCG tablet Take 1 tablet (25 mcg total) by mouth daily before breakfast. 30 tablet 9  . lisinopril (PRINIVIL,ZESTRIL) 40 MG tablet Take 0.5 tablets (20 mg total) by mouth daily. 90 tablet 3  . metoprolol succinate (TOPROL-XL) 50 MG 24 hr tablet Take 1 tablet (50 mg total) by mouth daily. 90 tablet 3  . omeprazole (PRILOSEC) 40 MG capsule Take 1 capsule (40 mg total) by mouth daily. 90 capsule 3  . oxybutynin (DITROPAN) 5 MG tablet Take 1 tablet (5 mg total) by mouth daily. 90 tablet 3  . pramipexole (MIRAPEX) 1 MG tablet Take 1 tablet (1 mg total) by mouth at bedtime. 90 tablet 3  . pravastatin (PRAVACHOL) 40 MG tablet Take 1 tablet (40 mg total) by mouth daily. 90 tablet 3  . testosterone cypionate  (DEPOTESTOTERONE CYPIONATE) 200 MG/ML injection Inject 300 mg into the muscle every 28 (twenty-eight) days.       Lab Results: UA: 0-2 rbc, 7-10 wbc 30mg  protein   Recent Labs  03/30/15 0236 03/31/15 0400 04/01/15 0227  WBC 14.1* 17.7* 13.7*  HGB 10.2* 9.9* 10.2*  HCT 29.7* 28.1* 28.4*  PLT 93* 94* 86*   BMET  Recent Labs  03/30/15 0236  03/31/15 0400 03/31/15 1845 04/01/15 0227  NA 131*  < > 132* 130* 133*  K 3.8  < > 3.3* 4.7 3.4*  CL 103  < > 105 103 107  CO2 15*  < > 17* 14* 18*  GLUCOSE 38*  < > 100* 103* 84  BUN 49*  < > 50* 50* 46*  CREATININE 2.78*  < > 3.35* 3.46* 3.61*  CALCIUM 7.4*  < > 7.8* 8.1* 8.4*  PHOS 3.1  --  3.4  --   --   < > = values in this interval not displayed. LFT  Recent Labs  03/31/15 1215 04/01/15 0227  PROT 5.4* 5.0*  ALBUMIN 2.4* 2.2*  AST 58* 41  ALT 174* 135*  ALKPHOS 210* 210*  BILITOT 7.7* 8.2*  BILIDIR 5.2*  --   IBILI 2.5*  --    US Abdomen Limited Ruq  03/31/2015   CLINICAL DATA:  Questionable choledocholithiasis on recent CT  EXAM: US ABDOMEN LIMITED - RIGHT UPPER QUADRANT  COMPARISON:  Abdominal ultrasound and CT abdomen and pelvis March 28, 2015  FINDINGS: Gallbladder:  The gallbladder is somewhat contracted. There are nonshadowing echogenic foci in the gallbladder similar to 1 day prior. The gallbladder Cripps is upper normal in thickness. There is no pericholecystic fluid. No sonographic Murphy sign noted.  Common bile duct:  Diameter: 6 mm. There is no mass or calculus seen in the visualized portions of the common bile duct. Portions of the common bile duct are obscured by gas.  Liver:  No focal lesion identified. Within normal limits in parenchymal echogenicity.  IMPRESSION: Note that the study is less than optimal due to patient's inability to suspend respiration significantly. No choledocholithiasis is seen by ultrasound. Portions of the common bile duct, however, are not well visualized due to the patient's inability to  suspend respiration and overlying gas. There is upper normal gallbladder [place] thickness with several non shadowing echogenic foci in the gallbladder. Question non shadowing gallstones versus small foci of sludge.  MRCP would be the optimal study to further evaluate the common bile duct. If patient becomes able to suspend respiration for a significant period of time, consideration may wish to be given to MRCP for further assessment.   Electronically Signed  By: Lowella Grip III M.D.   On: 03/31/2015 02:25    ROS: No change in vision No CP + SOB Denies abd pain at this time No change in bowels No new arthritic CO No dysuria  PHYSICAL EXAM: Blood pressure 153/86, pulse 85, temperature 97.3 F (36.3 C), temperature source Oral, resp. rate 12, height 5\' 10"  (1.778 m), weight 80 kg (176 lb 5.9 oz), SpO2 99 %. HEENT: PERRLA EOMI + Arcus senilus, + scleral icterus NECK:No JVD LUNGS:Clear CARDIAC:RRR w 1/6 systolic M ABD:+ BS NTND No HSM EXT:No edema GU: some edema of penis NEURO:CNI Ox3 no asterixis  Assessment: 1. AKI sec ischemic ATN in face of ACE inhibitor 2. E coli bacteremia PLAN: 1. Cont IV fluids 2. Discussed with pt need to collect all urine 3. Expect this to recover, its just a matter of when.  Hopefully he will not require HD prior to recovery but I did discuss this possibility with him 4. Daily labs   Vida Nicol T 04/01/2015, 11:59 AM

## 2015-04-01 NOTE — Progress Notes (Signed)
eLink Physician-Brief Progress Note Patient Name: [name] DOB: 1926-09-01 MRN: [id number]   Date of Service  04/01/2015  HPI/Events of Note  Patient is on room air with sat = 96%. BP = 129/71 and HR = 74.   eICU Interventions  OK to transfer to stepdown bed.     Intervention Category Minor Interventions: Routine modifications to care plan (e.g. PRN medications for pain, fever)  Janann Boeve Eugene 04/01/2015, 4:39 PM

## 2015-04-01 NOTE — Evaluation (Signed)
Occupational Therapy Evaluation Patient Details Name: [name] MRN: [id number] DOB: [birth year] Today's Date: 04/01/2015    History of Present Illness Patient is an 81 y/o male admitted with fevers and hypotension found to have E coli sepsis possibly sec to cholecystitis. Intubated 8/9-8/11/16.  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 He demonstrates the below listed deficits and will benefit from continued OT to maximize safety and independence with BADLs.  Pt presents to OT with generalized weakness and impaired balance.  He currently, requires min - mod A with ADLs.  Anticipate he will progress to min guard to supervision level to return home with family's assist.       Follow Up Recommendations  Home health OT;Supervision/Assistance - 24 hour    Equipment Recommendations  Tub/shower seat    Recommendations for Other Services       Precautions / Restrictions Precautions Precautions: Fall      Mobility Bed Mobility Overal bed mobility: Needs Assistance Bed Mobility: Sit to Supine     Supine to sit: Min assist Sit to supine: Min assist   General bed mobility comments: assist to lift LEs onto bed   Transfers Overall transfer level: Needs assistance Equipment used: 1 person hand held assist Transfers: Sit to/from [facility] Sit to Stand: Min assist Stand pivot transfers: Min assist       General transfer comment: min A for balance     Balance Overall balance assessment: Needs assistance Sitting-balance support: Feet supported Sitting balance-Leahy Scale: Good     Standing balance support: Bilateral upper extremity supported Standing balance-Leahy Scale: Poor Standing balance comment: min A for balance and UE support                              ADL Overall ADL's : Needs assistance/impaired Eating/Feeding: Independent;Sitting   Grooming: Wash/dry hands;Wash/dry face;Oral care;Brushing hair;Set up;Sitting   Upper Body Bathing: Set up;Sitting   Lower Body Bathing: Moderate assistance;Sit to/from stand   Upper Body Dressing : Minimal assistance;Sitting   Lower Body Dressing: Moderate assistance;Sit to/from stand Lower Body Dressing Details (indicate cue type and reason): Pt with DOE 3/4 with LB ADLs.  Requires 2 seated rest breaks to complete activity  Toilet Transfer: Minimal assistance;Stand-pivot;BSC   Toileting- Clothing Manipulation and Hygiene: Moderate assistance;Sit to/from stand       Functional mobility during ADLs: Minimal assistance General ADL Comments: Pt fatigues requiring rest breaks during activity.       Vision     Perception     Praxis      Pertinent Vitals/Pain Pain Assessment: No/denies pain     Hand Dominance Right   Extremity/Trunk Assessment Upper Extremity Assessment Upper Extremity Assessment: Generalized weakness   Lower Extremity Assessment Lower Extremity Assessment: Defer to PT evaluation   Cervical / Trunk Assessment Cervical / Trunk Assessment: Normal   Communication Communication Communication: No difficulties   Cognition Arousal/Alertness: Awake/alert Behavior During Therapy: WFL for tasks assessed/performed Overall Cognitive Status: Within Functional Limits for tasks assessed                     General Comments       Exercises       Shoulder Instructions  Home Living Family/patient expects to be discharged to:: Private residence Living Arrangements: Alone Available Help at Discharge: Family;Available 24 hours/day (brother lives close and pt reports can stay with him) Type of Home: House       Home Layout: One level     Bathroom Shower/Tub: Tub/shower unit;Door Shower/tub characteristics: Door       Home Equipment: Walker - 2  wheels          Prior Functioning/Environment Level of Independence: Independent        Comments: Pt reports he drives, grocery shops, maintains 20 acres of land     OT Diagnosis: Generalized weakness   OT Problem List: Decreased strength;Decreased activity tolerance;Impaired balance (sitting and/or standing);Decreased safety awareness   OT Treatment/Interventions: Self-care/ADL training;Therapeutic exercise;DME and/or AE instruction;Therapeutic activities;Patient/family education;Balance training    OT Goals(Current goals can be found in the care plan section) Acute Rehab OT Goals Patient Stated Goal: to get stronger  OT Goal Formulation: With patient Time For Goal Achievement: 04/15/15 Potential to Achieve Goals: Good ADL Goals Pt Will Perform Grooming: with supervision;standing Pt Will Perform Upper Body Bathing: with set-up;sitting Pt Will Perform Lower Body Bathing: with supervision;sit to/from stand Pt Will Perform Upper Body Dressing: with set-up;sitting Pt Will Perform Lower Body Dressing: with supervision;sit to/from stand Pt Will Transfer to Toilet: with supervision;ambulating;regular height toilet;bedside commode;grab bars Pt Will Perform Toileting - Clothing Manipulation and hygiene: with supervision;sit to/from stand Pt Will Perform Tub/Shower Transfer: Tub transfer;ambulating;grab bars Pt/caregiver will Perform Home Exercise Program: Increased strength;Both right and left upper extremity;With written HEP provided;Independently  OT Frequency: Min 2X/week   Barriers to D/C: Decreased caregiver support          Co-evaluation              End of Session Nurse Communication: Mobility status  Activity Tolerance: Patient tolerated treatment well Patient left: in bed;with call bell/phone within reach   Time: 3361-2244 OT Time Calculation (min): 31 min Charges:  OT General Charges $OT Visit: 1 Procedure OT Evaluation $OT Re-eval: 1 Procedure OT  Treatments $Self Care/Home Management : 8-22 mins G-Codes:    Cozette Braggs M 04/03/2015, 5:05 PM

## 2015-04-01 NOTE — Progress Notes (Signed)
Nutrition Follow-up   INTERVENTION:   Ensure Enlive po BID, each supplement provides 350 kcal and 20 grams of protein   NUTRITION DIAGNOSIS:   Increased nutrient needs related to catabolic illness as evidenced by estimated needs.  ongoing  GOAL:   Patient will meet greater than or equal to 90% of their needs  Not yet met.   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  Pt extubated 8/10 and started on PO diet. Pt had liquids for Breakfast but will have Heart Healthy diet at lunch.  Labs reviewed: sodium and potassium low Pt treated for sepsis.  Per MD may need placement was living alone before admission.  Pt discussed during ICU rounds and with RN.   Diet Order:  Diet Heart Room service appropriate?: Yes; Fluid consistency:: Thin  Skin:  Reviewed, no issues  Last BM:  8/11  Height:   Ht Readings from Last 1 Encounters:  03/29/15 5' 10" (1.778 m)    Weight:   Wt Readings from Last 1 Encounters:  04/01/15 176 lb 5.9 oz (80 kg)    Ideal Body Weight:  75.4 kg  BMI:  Body mass index is 25.31 kg/(m^2).  Estimated Nutritional Needs:   Kcal:  2355  Protein:  90-105 grams  Fluid:  > 1.7 L/day  EDUCATION NEEDS:   No education needs identified at this time  [place], [place], [place] Pager [phone] After Hours Pager

## 2015-04-01 NOTE — Progress Notes (Addendum)
PULMONARY / CRITICAL CARE MEDICINE   Name: [name] MRN: [id number] DOB: [birth year]    ADMISSION DATE:  03/28/2015 CONSULTATION DATE:  03/29/15  REFERRING MD :  Dr. 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  He had been seen at an urgent care the day prior for epigastric pain.    On EMS arrival to the home, the patient was found to be hypotensive and hypoglycemic which responded to IVF's and dextrose.  The patient reported of approximately 48 hours of epigastric pain, malaise and fevers.   Initial work up showed temp 101, WBC 22.8, tachycardia, hypotension, AST/ALT 454/521 / bilirubin 6.1, sr cr 2.32, Na 129 and a lactic acid of 4.56.  Initial CXR showed a large hiatal hernia but no edema or consolidation.  An ultrasound of the abdomen was obtained and revealed an abnormal appearance of the gallbladder with contraction, Nantz thickening and nodularity without discrete.  Follow up CT of the abdomen was assessed and showed an large, non-obstructing hiatal hernia with intrathoracic stomach dilatation, colon diverticulosis, peri-nephric stranding and free fluid in the pelvis.  The patient was admitted per [facility].  He was also evaluated by GI and CCS who both feel findings are consistent with cholangitis and sepsis.  The patient was volume resuscitated, treated with abx (aztreonam/vanco in ER, then to levaquin / flagyl), pan cultured and monitored on SDU.  Unfortunately, the afternoon of 8/9, he deteriorated and required emergent intubation with transfer to ICU.  PCCM consulted for evaluation.       STUDIES:/EVENTS 8/08  Admitted with abdominal pain, hypotension. 8/08  [place] ABD >> abnormal appearance of the  gallbladder with contraction, [place] thickening and nodularity, no discrete stones, no other acute abnormality.  CBD 8.5 mm 8/08  CT ABD/Pelvis >> gaseous distention of the stomach, large hiatal hernia, small high attenuation structure in the distal common bile duct (? Choledocholithiasis or small periampullary duodenum diverticulum), non-specific perinephric stranding, small volume free fluid within the pelvis  03/29/15: intubated. Moved to ICU  03/30/15: Not on pressors. Extubated. Repeated transient asymptomatic hypoglycemia per RN. He has denued abd pain to CCS and GI. CCS suspecting CBD stone with obstruction based on LFT but d/w DR Carlean Purl of GI - he reviewed CT with radiology - CBD stone/dilatation presence not convincing. GI  Blood culture growing Gram Negativ Coccobacilli   SUBJECTIVE / Interval events:  04/01/2015 :  Remains extubated. Creat rising - now with ATN levels. Bilit rising. GI is ok with patient eating. GI feels MRCP technically difficult. RN reporting fluid filled pocket by penis yesterday - paraphimosis + new . RN reporting increased frequency with small volumes   VITAL SIGNS: Temp:  [97.4 F (36.3 C)-97.8 F (36.6 C)] 97.5 F (36.4 C) (08/12 0800) Pulse Rate:  [57-151] 85 (08/12 1000) Resp:  [10-21] 12 (08/12 0900) BP: (109-159)/(56-87) 153/86 mmHg (08/12 1000) SpO2:  [93 %-100 %] 99 % (08/12 1000) Weight:  [80 kg (176 lb 5.9 oz)] 80 kg (176 lb 5.9 oz) (08/12 0600)   HEMODYNAMICS:     VENTILATOR SETTINGS:     INTAKE / OUTPUT:  Intake/Output Summary (Last 24 hours) at 04/01/15 1027 Last data filed at 04/01/15 1000  Gross  per 24 hour  Intake   1950 ml  Output   1200 ml  Net    750 ml    PHYSICAL EXAMINATION: General:  Elderly male, NAD Neuro:  Awake, alert, moves all ext HEENT:  MM pink/moist, Cardiovascular:  s1s2 rrr, no m/r/g Lungs:  resp's even/non-labored, lungs bilaterally coarse, no wheeze Abdomen:  Obese, distended, bsx4 hypoactive  Musculoskeletal:   No acute deformities  Skin:  Warm/dry, mottled prior to intubation, improved / pink GU  - paraphimosis + with dependent edema almost like a hydrocele - New per RN  LABS: PULMONARY  Recent Labs Lab 03/29/15 1425 03/29/15 1635 03/30/15 0437 03/30/15 1619 03/30/15 1633  PHART 6.944* 7.261* 7.405 7.295* 7.284*  PCO2ART 78.3* 30.2* 21.5* 28.6* 29.0*  PO2ART 87.7 241* 94.5 84.0 92.0  HCO3 16.1* 13.1* 13.3* 14.0* 13.9*  TCO2 18.5 14.0 14.0 15 15  O2SAT 91.2 99.3 98.1 96.0 97.0    CBC  Recent Labs Lab 03/30/15 0236 03/31/15 0400 04/01/15 0227  HGB 10.2* 9.9* 10.2*  HCT 29.7* 28.1* 28.4*  WBC 14.1* 17.7* 13.7*  PLT 93* 94* 86*    COAGULATION  Recent Labs Lab 03/28/15 1834 03/30/15 1127  INR 2.59* 1.86*    CARDIAC    Recent Labs Lab 03/29/15 1735 03/30/15 1127 03/30/15 1856 03/31/15 0400  TROPONINI 0.19* 0.19* 0.14* 0.10*   No results for input(s): PROBNP in the last 168 hours.   CHEMISTRY  Recent Labs Lab 03/30/15 0236 03/30/15 2154 03/31/15 0400 03/31/15 1845 04/01/15 0227  NA 131* 132* 132* 130* 133*  K 3.8 3.6 3.3* 4.7 3.4*  CL 103 105 105 103 107  CO2 15* 17* 17* 14* 18*  GLUCOSE 38* 83 100* 103* 84  BUN 49* 50* 50* 50* 46*  CREATININE 2.78* 3.34* 3.35* 3.46* 3.61*  CALCIUM 7.4* 7.7* 7.8* 8.1* 8.4*  MG 1.5*  --  2.1 2.1  --   PHOS 3.1  --  3.4  --   --    Estimated Creatinine Clearance: 15.2 mL/min (by C-G formula based on Cr of 3.61).   LIVER  Recent Labs Lab 03/28/15 1834 03/29/15 0550 03/30/15 0236 03/30/15 1127 03/31/15 1215 04/01/15 0227  AST  --  236* 129*  135* 109* 58* 41  ALT  --  352* 234*  248* 221* 174* 135*  ALKPHOS  --  98 110  109 132* 210* 210*  BILITOT  --  6.0* 6.2*  6.6* 6.5* 7.7* 8.2*  PROT  --  5.2* 5.1*  4.8* 5.1* 5.4* 5.0*  ALBUMIN  --  2.6* 2.2*  2.3* 2.3* 2.4* 2.2*  INR 2.59*  --   --  1.86*  --   --      INFECTIOUS  Recent Labs Lab 03/29/15 1611 03/30/15 1127 03/31/15 0312  03/31/15 0400 04/01/15 0227  LATICACIDVEN 2.9* 1.7 1.0  --   --   PROCALCITON  --  50.56  --  36.28 19.90     ENDOCRINE CBG (last 3)   Recent Labs  03/31/15 1202 03/31/15 1608 03/31/15 1946  GLUCAP 103* 112* 103*    IMAGING x48h  - image(s) personally visualized Dg Chest Port 1 View  03/30/2015   CLINICAL DATA:  Acute respiratory failure.  EXAM: PORTABLE CHEST - 1 VIEW  COMPARISON:  03/29/2015.  FINDINGS: 1116 hours. Endotracheal tube tip is 3.5 cm above the carina. Nasogastric tube projects below the diaphragm, tip not visualized. There is interval improved aeration of the lung bases with mild residual bibasilar atelectasis.  No confluent airspace opacity, edema or significant pleural effusion. Cardiomegaly and a large hiatal hernia are again noted post CABG.  IMPRESSION: Interval improved aeration of the lung bases. Stable support system.   Electronically Signed   By: Richardean Sale M.D.   On: 03/30/2015 11:44   Dg Abd Portable 1v  03/30/2015   CLINICAL DATA:  Abnormal LFT  EXAM: PORTABLE ABDOMEN - 1 VIEW  COMPARISON:  03/29/2015  FINDINGS: NG tube in the stomach. Normal bowel gas pattern. Contrast in the rectum from prior CT. Surgical clips in the pelvis.  Lumbar scoliosis and degenerative change.  No renal calculi.  IMPRESSION: Normal bowel gas pattern.  NG tube in the stomach.   Electronically Signed   By: Franchot Gallo M.D.   On: 03/30/2015 11:46   US Abdomen Limited Ruq  03/31/2015   CLINICAL DATA:  Questionable choledocholithiasis on recent CT  EXAM: US ABDOMEN LIMITED - RIGHT UPPER QUADRANT  COMPARISON:  Abdominal ultrasound and CT abdomen and pelvis March 28, 2015  FINDINGS: Gallbladder:  The gallbladder is somewhat contracted. There are nonshadowing echogenic foci in the gallbladder similar to 1 day prior. The gallbladder Brosch is upper normal in thickness. There is no pericholecystic fluid. No sonographic Murphy sign noted.  Common bile duct:  Diameter: 6 mm. There is no mass  or calculus seen in the visualized portions of the common bile duct. Portions of the common bile duct are obscured by gas.  Liver:  No focal lesion identified. Within normal limits in parenchymal echogenicity.  IMPRESSION: Note that the study is less than optimal due to patient's inability to suspend respiration significantly. No choledocholithiasis is seen by ultrasound. Portions of the common bile duct, however, are not well visualized due to the patient's inability to suspend respiration and overlying gas. There is upper normal gallbladder Fortson thickness with several non shadowing echogenic foci in the gallbladder. Question non shadowing gallstones versus small foci of sludge.  MRCP would be the optimal study to further evaluate the common bile duct. If patient becomes able to suspend respiration for a significant period of time, consideration may wish to be given to MRCP for further assessment.   Electronically Signed   By: Lowella Grip III M.D.   On: 03/31/2015 02:25      ASSESSMENT / PLAN:  PULMONARY OETT 8/9 >> 8/10 A: Acute Hypercarbic Respiratory Failure - s/p successful extubation 03/30/15    P:   Push pulm hygiene  PRN albuterol   CARDIOVASCULAR CVL A:  #Baseline  - PAF/AF - on Eliquis Hx HTN, CAD s/p CABG  #AT admit Severe Sepsis - Ecoli bacteremia - Source ? Gallbladder vs bowel translocation   - not on pressors . ECHO ef 55% with gr2 diast dysfn  P:   Change IVF  d5 0.45 NS at 50cc/h -> 10cc/h Hold Eliquis and Hold home norvasc, HCTZ, lisinopril, toprol Lasix depending on renal conslt   RENAL A:   Metabolic / Lactic Acidosis - in setting of sepsis  Acute on Chronic CKD - baseline sr cr ~ 1.3    - Creat in ATN range and rising but making urine. RN reporting increased frequency.NEW PARAPHIMOSIS +  P:   RN directed reduction of paraphimosis -> if fails call urology Check ABG Check bladder scan for urinary retention Trend BMP / UOP, anticipate renal  recovery as UOP increasing Replace electrolytes as indicated     GASTROINTESTINAL A:   Large Hiatal Hernia  ? Transient biliary obstruction and cholecystitis  Possible bowel bacterial translocation, E coli P:   PPI for SUP, change to PO when tolerating diet OK to start clear diet on 8/11 -> HH diet 04/01/15 GI / CCS Following; discussed case with Dr Carlean Purl on 8/11 and APP 04/01/15 Trend LFT's  HEMATOLOGIC A:   Anemia  Thrombocytopenia - in setting of severe sepsis  Elevated PT P:  Trend CBC  SCD's for DVT Monitor PT Hold Eliquis for now   INFECTIOUS A:   Severe Sepsis / E coli Bacteremia - in setting of suspected abdominal source infection, ? Cholecystitis  P:   BCx2 8/8 >> E coli (R unasyn, I zosyn) UC 8/8 >> negative Sputum 8/9 >> normal flora  Levaquin, 8/8 >> 8/9 Flagyl 8/8 >> 8/11 Azactam 8/8 >> 8/11 Vanco 8/9 >> 8/11 cipro 8/11 >>   No clear indication for ERCP or GB drainage at this time.   ENDOCRINE A:   Hypoglycemia  P:   Follow CBG d5 0.45 NS  NEUROLOGIC A:   Acute Metabolic Encephalopathy - in setting of severe sepsis, improvedresp;ved  P:   RASS goal: 0 Fentanyl PRN for pain   FAMILY  - Updates:  Jarrin Staley - son, [phone]. Updated at bedside 03/30/15. Brother updated 04/01/15  - Inter-disciplinary family meet or Palliative Care meeting due by:  8/16.     GLOBAL 04/01/15: Keep in ICU x 1/2 to 1 day. Decide on tx depending on ABG and renal consult and paraphimosis outcome. Very deconditioned - OOB to chair, PT consult and might need placment at dc (was living alone)     Dr. Brand Males, M.D., [facility].C.P Pulmonary and Critical Care Medicine Staff Physician [place] Pager: [phone], If no answer or between  15:00h - 7:00h: call [phone]  04/01/2015 10:52 AM

## 2015-04-01 NOTE — Progress Notes (Signed)
Case d/w Dr Sherral Hammers - patient now in floor. TRH to be primary from 04/02/15 and pccm off  .Dr. Brand Males, M.D., [facility].C.P Pulmonary and Critical Care Medicine Staff Physician [place] [facility] Pager: [phone], If no answer or between  15:00h - 7:00h: call [phone]  04/01/2015 5:55 PM

## 2015-04-01 NOTE — Evaluation (Signed)
Physical Therapy Evaluation Patient Details Name: [name] MRN: [id number] DOB: 06/16/1927 Today's Date: 04/01/2015   History of Present Illness  Patient is an 81 y/o male admitted with fevers and hypotension found to have E coli sepsis possibly sec to cholecystitis. Intubated 8/9-8/11/16.  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  He will benefit from skilled PT in the acute setting to allow return home with brother assist and HHPT.    Follow Up Recommendations Home health PT;Supervision/Assistance - 24 hour    Equipment Recommendations  None recommended by PT    Recommendations for Other Services       Precautions / Restrictions Precautions Precautions: Fall      Mobility  Bed Mobility Overal bed mobility: Needs Assistance Bed Mobility: Supine to Sit     Supine to sit: Min assist     General bed mobility comments: assist to lift trunk; pt dislodged IV when scooting to edge of bed and needed RN to assist with redressing due to bleeding  Transfers Overall transfer level: Needs assistance Equipment used: 2 person hand held assist Transfers: Sit to/from Stand Sit to Stand: Min assist;+2 physical assistance;+2 safety/equipment;Independent         General transfer comment: assist for stability to stand to pivot to chair, limited ambulation due to pt report of SOB  Ambulation/Gait                Stairs            Wheelchair Mobility    Modified Rankin (Stroke Patients Only)       Balance Overall balance assessment: Needs assistance         Standing balance support: Bilateral upper extremity supported Standing balance-Leahy Scale: Poor Standing balance comment: posterior lean with  pivotal steps to chair                             Pertinent Vitals/Pain Pain Assessment: No/denies pain    Home Living Family/patient expects to be discharged to:: Private residence Living Arrangements: Alone Available Help at Discharge: Family;Available 24 hours/day (brother lives close and pt reports can stay with him) Type of Home: [place]: One level Home Equipment: Environmental consultant - 2 wheels      Prior Function Level of Independence: Independent               Hand Dominance   Dominant Hand: Right    Extremity/Trunk Assessment   Upper Extremity Assessment: Generalized weakness           Lower Extremity Assessment: Generalized weakness         Communication   Communication: No difficulties  Cognition Arousal/Alertness: Awake/alert Behavior During Therapy: WFL for tasks assessed/performed Overall Cognitive Status: Within Functional Limits for tasks assessed                      General Comments General comments (skin integrity, edema, etc.): yellow tint noted in eyes    Exercises        Assessment/Plan    PT Assessment Patient needs continued PT services  PT Diagnosis Generalized weakness   PT Problem List Decreased  strength;Cardiopulmonary status limiting activity;Decreased activity tolerance;Decreased balance;Decreased mobility;Decreased knowledge of use of DME  PT Treatment Interventions DME instruction;Balance training;Gait training;Stair training;Functional mobility training;Patient/family education;Therapeutic activities;Therapeutic exercise   PT Goals (Current goals can be found in the Care Plan section) Acute Rehab PT Goals Patient Stated Goal: To return home with brother to assist PT Goal Formulation: With patient Time For Goal Achievement: 04/15/15 Potential to Achieve Goals: Good    Frequency Min 3X/week   Barriers to discharge        Co-evaluation               End of Session Equipment  Utilized During Treatment: Gait belt Activity Tolerance: Patient tolerated treatment well Patient left: in chair;with call bell/phone within reach Nurse Communication: Mobility status         Time: 5643-3295 PT Time Calculation (min) (ACUTE ONLY): 43 min   Charges:   PT Evaluation $Initial PT Evaluation Tier I: 1 Procedure PT Treatments $Therapeutic Activity: 8-22 mins   PT G Codes:        WYNN,CYNDI Apr 12, 2015, 1:33 PM  Magda Kiel, [place] Apr 12, 2015

## 2015-04-01 NOTE — Progress Notes (Signed)
Patient ID: [name], male   DOB: June 05, 1927, 81 y.o.   MRN: [id number]    Subjective: Pt without complaints  Objective: Vital signs in last 24 hours: Temp:  [97.4 F (36.3 C)-97.8 F (36.6 C)] 97.5 F (36.4 C) (08/12 0800) Pulse Rate:  [57-151] 85 (08/12 1000) Resp:  [10-21] 12 (08/12 0900) BP: (109-159)/(56-87) 153/86 mmHg (08/12 1000) SpO2:  [93 %-100 %] 99 % (08/12 1000) Weight:  [80 kg (176 lb 5.9 oz)] 80 kg (176 lb 5.9 oz) (08/12 0600) Last BM Date: 03/29/15  Intake/Output from previous day: 08/11 0701 - 08/12 0700 In: 1840 [I.V.:990] Out: 1000 [Urine:1000] Intake/Output this shift: Total I/O In: 150 [I.V.:150] Out: 200 [Urine:200]  PE: Abd: soft, NT, Nd, +BS HEENT: sclera are icteric   Lab Results:   Recent Labs  03/31/15 0400 04/01/15 0227  WBC 17.7* 13.7*  HGB 9.9* 10.2*  HCT 28.1* 28.4*  PLT 94* 86*   BMET  Recent Labs  03/31/15 1845 04/01/15 0227  NA 130* 133*  K 4.7 3.4*  CL 103 107  CO2 14* 18*  GLUCOSE 103* 84  BUN 50* 46*  CREATININE 3.46* 3.61*  CALCIUM 8.1* 8.4*   PT/INR  Recent Labs  03/30/15 1127  LABPROT 21.3*  INR 1.86*   CMP     Component Value Date/Time   NA 133* 04/01/2015 0227   NA 138 12/28/2014 0952   K 3.4* 04/01/2015 0227   CL 107 04/01/2015 0227   CO2 18* 04/01/2015 0227   GLUCOSE 84 04/01/2015 0227   GLUCOSE 84 12/28/2014 0952   BUN 46* 04/01/2015 0227   BUN 22 12/28/2014 0952   CREATININE 3.61* 04/01/2015 0227   CREATININE 1.33 02/17/2013 1108   CALCIUM 8.4* 04/01/2015 0227   PROT 5.0* 04/01/2015 0227   PROT 6.8 12/28/2014 0952   ALBUMIN 2.2* 04/01/2015 0227   AST 41 04/01/2015 0227   ALT 135* 04/01/2015 0227   ALKPHOS 210* 04/01/2015 0227   BILITOT 8.2* 04/01/2015 0227   BILITOT 0.8 12/28/2014 0952   GFRNONAA 14* 04/01/2015 0227   GFRNONAA 49* 02/17/2013 1108   GFRAA 16* 04/01/2015 0227   GFRAA 56* 02/17/2013 1108   Lipase     Component Value Date/Time   LIPASE 12* 03/28/2015 1205        Studies/Results: Dg Chest Port 1 View  03/30/2015   CLINICAL DATA:  Acute respiratory failure.  EXAM: PORTABLE CHEST - 1 VIEW  COMPARISON:  03/29/2015.  FINDINGS: 1116 hours. Endotracheal tube tip is 3.5 cm above the carina. Nasogastric tube projects below the diaphragm, tip not visualized. There is interval improved aeration of the lung bases with mild residual bibasilar atelectasis. No confluent airspace opacity, edema or significant pleural effusion. Cardiomegaly and a large hiatal hernia are again noted post CABG.  IMPRESSION: Interval improved aeration of the lung bases. Stable support system.   Electronically Signed   By: Richardean Sale M.D.   On: 03/30/2015 11:44   Dg Abd Portable 1v  03/30/2015   CLINICAL DATA:  Abnormal LFT  EXAM: PORTABLE ABDOMEN - 1 VIEW  COMPARISON:  03/29/2015  FINDINGS: NG tube in the stomach. Normal bowel gas pattern. Contrast in the rectum from prior CT. Surgical clips in the pelvis.  Lumbar scoliosis and degenerative change.  No renal calculi.  IMPRESSION: Normal bowel gas pattern.  NG tube in the stomach.   Electronically Signed   By: Franchot Gallo M.D.   On: 03/30/2015 11:46   US Abdomen Limited Ruq  03/31/2015   CLINICAL DATA:  Questionable choledocholithiasis on recent CT  EXAM: US ABDOMEN LIMITED - RIGHT UPPER QUADRANT  COMPARISON:  Abdominal ultrasound and CT abdomen and pelvis March 28, 2015  FINDINGS: Gallbladder:  The gallbladder is somewhat contracted. There are nonshadowing echogenic foci in the gallbladder similar to 1 day prior. The gallbladder [place] is upper normal in thickness. There is no pericholecystic fluid. No sonographic Murphy sign noted.  Common bile duct:  Diameter: 6 mm. There is no mass or calculus seen in the visualized portions of the common bile duct. Portions of the common bile duct are obscured by gas.  Liver:  No focal lesion identified. Within normal limits in parenchymal echogenicity.  IMPRESSION: Note that the study is less  than optimal due to patient's inability to suspend respiration significantly. No choledocholithiasis is seen by ultrasound. Portions of the common bile duct, however, are not well visualized due to the patient's inability to suspend respiration and overlying gas. There is upper normal gallbladder Widen thickness with several non shadowing echogenic foci in the gallbladder. Question non shadowing gallstones versus small foci of sludge.  MRCP would be the optimal study to further evaluate the common bile duct. If patient becomes able to suspend respiration for a significant period of time, consideration may wish to be given to MRCP for further assessment.   Electronically Signed   By: Lowella Grip III M.D.   On: 03/31/2015 02:25    Anti-infectives: Anti-infectives    Start     Dose/Rate Route Frequency Ordered Stop   03/31/15 1000  ciprofloxacin (CIPRO) IVPB 400 mg     400 mg 200 mL/hr over 60 Minutes Intravenous Every 24 hours 03/31/15 0933     03/30/15 1400  vancomycin (VANCOCIN) IVPB 1000 mg/200 mL premix  Status:  Discontinued     1,000 mg 200 mL/hr over 60 Minutes Intravenous Every 48 hours 03/30/15 0751 03/31/15 0933   03/30/15 1300  levofloxacin (LEVAQUIN) IVPB 500 mg  Status:  Discontinued     500 mg 100 mL/hr over 60 Minutes Intravenous Every 48 hours 03/28/15 1407 03/28/15 1645   03/30/15 1230  levofloxacin (LEVAQUIN) IVPB 500 mg  Status:  Discontinued     500 mg 100 mL/hr over 60 Minutes Intravenous Every 48 hours 03/28/15 1707 03/29/15 1619   03/29/15 1500  vancomycin (VANCOCIN) IVPB 750 mg/150 ml premix  Status:  Discontinued     750 mg 150 mL/hr over 60 Minutes Intravenous Every 24 hours 03/28/15 1407 03/28/15 1645   03/29/15 1400  vancomycin (VANCOCIN) IVPB 750 mg/150 ml premix  Status:  Discontinued     750 mg 150 mL/hr over 60 Minutes Intravenous Every 24 hours 03/28/15 1713 03/30/15 0750   03/29/15 1230  levofloxacin (LEVAQUIN) IVPB 750 mg  Status:  Discontinued     750  mg 100 mL/hr over 90 Minutes Intravenous  Once 03/28/15 1656 03/28/15 1707   03/28/15 2100  aztreonam (AZACTAM) 1 g in dextrose 5 % 50 mL IVPB  Status:  Discontinued     1 g 100 mL/hr over 30 Minutes Intravenous Every 8 hours 03/28/15 1407 03/28/15 1645   03/28/15 2100  aztreonam (AZACTAM) 1 g in dextrose 5 % 50 mL IVPB  Status:  Discontinued     1 g 100 mL/hr over 30 Minutes Intravenous 3 times per day 03/28/15 1713 03/31/15 0933   03/28/15 1700  levofloxacin (LEVAQUIN) IVPB 750 mg  Status:  Discontinued     750 mg 100 mL/hr over  90 Minutes Intravenous  Once 03/28/15 1645 03/28/15 1656   03/28/15 1700  metroNIDAZOLE (FLAGYL) IVPB 500 mg  Status:  Discontinued     500 mg 100 mL/hr over 60 Minutes Intravenous  Once 03/28/15 1645 03/28/15 1658   03/28/15 1700  metroNIDAZOLE (FLAGYL) IVPB 500 mg  Status:  Discontinued     500 mg 100 mL/hr over 60 Minutes Intravenous Every 8 hours 03/28/15 1658 03/31/15 0940   03/28/15 1245  vancomycin (VANCOCIN) 1,500 mg in sodium chloride 0.9 % 500 mL IVPB     1,500 mg 250 mL/hr over 120 Minutes Intravenous STAT 03/28/15 1235 03/28/15 1649   03/28/15 1230  levofloxacin (LEVAQUIN) IVPB 750 mg     750 mg 100 mL/hr over 90 Minutes Intravenous  Once 03/28/15 1228 03/28/15 1440   03/28/15 1230  aztreonam (AZACTAM) 2 g in dextrose 5 % 50 mL IVPB     2 g 100 mL/hr over 30 Minutes Intravenous  Once 03/28/15 1228 03/28/15 1354   03/28/15 1230  vancomycin (VANCOCIN) IVPB 1000 mg/200 mL premix  Status:  Discontinued     1,000 mg 200 mL/hr over 60 Minutes Intravenous  Once 03/28/15 1228 03/28/15 1234       Assessment/Plan 1. Abdominal pain/elevated LFTs -TB and alk phos continue to rise with a direct bilirubinemia -defer to GI -no cholecystectomy this admit    LOS: 4 days    Bryston Colocho E 04/01/2015, 10:31 AM Pager: [phone]

## 2015-04-02 ENCOUNTER — Inpatient Hospital Stay (HOSPITAL_COMMUNITY): Payer: Medicare PPO

## 2015-04-02 DIAGNOSIS — I4892 Unspecified atrial flutter: Secondary | ICD-10-CM

## 2015-04-02 DIAGNOSIS — I2510 Atherosclerotic heart disease of native coronary artery without angina pectoris: Secondary | ICD-10-CM

## 2015-04-02 LAB — BASIC METABOLIC PANEL
Anion gap: 9 (ref 5–15)
BUN: 54 mg/dL — AB (ref 6–20)
CALCIUM: 8.9 mg/dL (ref 8.9–10.3)
CHLORIDE: 102 mmol/L (ref 101–111)
CO2: 20 mmol/L — AB (ref 22–32)
Creatinine, Ser: 3.68 mg/dL — ABNORMAL HIGH (ref 0.61–1.24)
GFR, EST AFRICAN AMERICAN: 16 mL/min — AB (ref >60)
GFR, EST NON AFRICAN AMERICAN: 14 mL/min — AB (ref >60)
Glucose, Bld: 122 mg/dL — ABNORMAL HIGH (ref 65–99)
Potassium: 3.5 mmol/L (ref 3.5–5.1)
Sodium: 131 mmol/L — ABNORMAL LOW (ref 135–145)

## 2015-04-02 LAB — CBC WITH DIFFERENTIAL/PLATELET
Basophils Absolute: 0 10*3/uL (ref 0.0–0.1)
Basophils Relative: 0 % (ref 0–1)
EOS ABS: 0.2 10*3/uL (ref 0.0–0.7)
Eosinophils Relative: 2 % (ref 0–5)
HCT: 27.8 % — ABNORMAL LOW (ref 39.0–52.0)
Hemoglobin: 9.8 g/dL — ABNORMAL LOW (ref 13.0–17.0)
LYMPHS ABS: 0.9 10*3/uL (ref 0.7–4.0)
Lymphocytes Relative: 9 % — ABNORMAL LOW (ref 12–46)
MCH: 30.6 pg (ref 26.0–34.0)
MCHC: 35.3 g/dL (ref 30.0–36.0)
MCV: 86.9 fL (ref 78.0–100.0)
MONOS PCT: 9 % (ref 3–12)
Monocytes Absolute: 0.9 10*3/uL (ref 0.1–1.0)
NEUTROS ABS: 8.5 10*3/uL — AB (ref 1.7–7.7)
NEUTROS PCT: 80 % — AB (ref 43–77)
PLATELETS: 94 10*3/uL — AB (ref 150–400)
RBC: 3.2 MIL/uL — AB (ref 4.22–5.81)
RDW: 14.4 % (ref 11.5–15.5)
WBC: 10.5 10*3/uL (ref 4.0–10.5)

## 2015-04-02 LAB — COMPREHENSIVE METABOLIC PANEL
ALBUMIN: 2.2 g/dL — AB (ref 3.5–5.0)
ALT: 101 U/L — AB (ref 17–63)
AST: 47 U/L — AB (ref 15–41)
Alkaline Phosphatase: 213 U/L — ABNORMAL HIGH (ref 38–126)
Anion gap: 8 (ref 5–15)
BUN: 52 mg/dL — ABNORMAL HIGH (ref 6–20)
CALCIUM: 8.7 mg/dL — AB (ref 8.9–10.3)
CO2: 18 mmol/L — ABNORMAL LOW (ref 22–32)
CREATININE: 3.72 mg/dL — AB (ref 0.61–1.24)
Chloride: 106 mmol/L (ref 101–111)
GFR calc Af Amer: 16 mL/min — ABNORMAL LOW (ref >60)
GFR, EST NON AFRICAN AMERICAN: 13 mL/min — AB (ref >60)
Glucose, Bld: 95 mg/dL (ref 65–99)
Potassium: 3.6 mmol/L (ref 3.5–5.1)
Sodium: 132 mmol/L — ABNORMAL LOW (ref 135–145)
Total Bilirubin: 10.6 mg/dL — ABNORMAL HIGH (ref 0.3–1.2)
Total Protein: 5.1 g/dL — ABNORMAL LOW (ref 6.5–8.1)

## 2015-04-02 LAB — TROPONIN I
Troponin I: 0.58 ng/mL (ref <0.031)
Troponin I: 0.07 ng/mL — ABNORMAL HIGH (ref <0.031)
Troponin I: 0.06 ng/mL — ABNORMAL HIGH (ref <0.031)

## 2015-04-02 LAB — PROTIME-INR
INR: 1.37 (ref 0.00–1.49)
Prothrombin Time: 17 seconds — ABNORMAL HIGH (ref 11.6–15.2)

## 2015-04-02 MED ORDER — METOPROLOL TARTRATE 1 MG/ML IV SOLN
5.0000 mg | INTRAVENOUS | Status: DC | PRN
  Administered 2015-04-02 – 2015-04-04 (×2): 5 mg via INTRAVENOUS
  Filled 2015-04-02 (×2): qty 5

## 2015-04-02 MED ORDER — DILTIAZEM HCL 100 MG IV SOLR
5.0000 mg/h | INTRAVENOUS | Status: DC
  Administered 2015-04-02 – 2015-04-03 (×2): 5 mg/h via INTRAVENOUS
  Filled 2015-04-02: qty 100

## 2015-04-02 MED ORDER — LORAZEPAM 2 MG/ML IJ SOLN
1.0000 mg | Freq: Once | INTRAMUSCULAR | Status: AC
  Administered 2015-04-02: 1 mg via INTRAVENOUS
  Filled 2015-04-02: qty 1

## 2015-04-02 MED ORDER — DILTIAZEM HCL 25 MG/5ML IV SOLN
10.0000 mg | Freq: Once | INTRAVENOUS | Status: AC
  Filled 2015-04-02: qty 5

## 2015-04-02 MED ORDER — DILTIAZEM HCL 100 MG IV SOLR
INTRAVENOUS | Status: AC
  Administered 2015-04-02: 10 mg/h
  Filled 2015-04-02: qty 100

## 2015-04-02 MED ORDER — METOPROLOL TARTRATE 25 MG PO TABS: 25.0000 mg | ORAL_TABLET | Freq: Two times a day (BID) | ORAL | Status: DC

## 2015-04-02 NOTE — Progress Notes (Signed)
UR COMPLETED  

## 2015-04-02 NOTE — Progress Notes (Signed)
Subjective: FEELS BAD  DENIES ABDOMINAL OR BACK PAIN  HR 130'S CURRENTLY NO NAUSEA OR VOMITING  Objective: Vital signs in last 24 hours: Temp:  [97.3 F (36.3 C)-98.8 F (37.1 C)] 97.6 F (36.4 C) (08/13 0420) Pulse Rate:  [58-155] 73 (08/13 0840) Resp:  [12-24] 21 (08/13 0840) BP: (113-167)/(61-100) 124/82 mmHg (08/13 0840) SpO2:  [93 %-99 %] 94 % (08/13 0840) Weight:  [81.829 kg (180 lb 6.4 oz)-83.643 kg (184 lb 6.4 oz)] 83.643 kg (184 lb 6.4 oz) (08/13 0420) Last BM Date: 03/31/15  Intake/Output from previous day: 08/12 0701 - 08/13 0700 In: 510 [P.O.:240; I.V.:270] Out: 1415 [Urine:1415] Intake/Output this shift: Total I/O In: 150 [P.O.:150] Out: -   GI: soft, non-tender; bowel sounds normal; no masses,  no organomegaly  Lab Results:   Recent Labs  04/01/15 0227 04/02/15 0553  WBC 13.7* 10.5  HGB 10.2* 9.8*  HCT 28.4* 27.8*  PLT 86* 94*   BMET  Recent Labs  04/01/15 0227 04/02/15 0553  NA 133* 132*  K 3.4* 3.6  CL 107 106  CO2 18* 18*  GLUCOSE 84 95  BUN 46* 52*  CREATININE 3.61* 3.72*  CALCIUM 8.4* 8.7*   PT/INR  Recent Labs  03/30/15 1127 04/02/15 0553  LABPROT 21.3* 17.0*  INR 1.86* 1.37   ABG  Recent Labs  03/30/15 1633 04/01/15 1104  PHART 7.284* 7.342*  HCO3 13.9* 17.1*    Studies/Results: No results found.  Anti-infectives: Anti-infectives    Start     Dose/Rate Route Frequency Ordered Stop   03/31/15 1000  ciprofloxacin (CIPRO) IVPB 400 mg     400 mg 200 mL/hr over 60 Minutes Intravenous Every 24 hours 03/31/15 0933     03/30/15 1400  vancomycin (VANCOCIN) IVPB 1000 mg/200 mL premix  Status:  Discontinued     1,000 mg 200 mL/hr over 60 Minutes Intravenous Every 48 hours 03/30/15 0751 03/31/15 0933   03/30/15 1300  levofloxacin (LEVAQUIN) IVPB 500 mg  Status:  Discontinued     500 mg 100 mL/hr over 60 Minutes Intravenous Every 48 hours 03/28/15 1407 03/28/15 1645   03/30/15 1230  levofloxacin (LEVAQUIN) IVPB 500 mg   Status:  Discontinued     500 mg 100 mL/hr over 60 Minutes Intravenous Every 48 hours 03/28/15 1707 03/29/15 1619   03/29/15 1500  vancomycin (VANCOCIN) IVPB 750 mg/150 ml premix  Status:  Discontinued     750 mg 150 mL/hr over 60 Minutes Intravenous Every 24 hours 03/28/15 1407 03/28/15 1645   03/29/15 1400  vancomycin (VANCOCIN) IVPB 750 mg/150 ml premix  Status:  Discontinued     750 mg 150 mL/hr over 60 Minutes Intravenous Every 24 hours 03/28/15 1713 03/30/15 0750   03/29/15 1230  levofloxacin (LEVAQUIN) IVPB 750 mg  Status:  Discontinued     750 mg 100 mL/hr over 90 Minutes Intravenous  Once 03/28/15 1656 03/28/15 1707   03/28/15 2100  aztreonam (AZACTAM) 1 g in dextrose 5 % 50 mL IVPB  Status:  Discontinued     1 g 100 mL/hr over 30 Minutes Intravenous Every 8 hours 03/28/15 1407 03/28/15 1645   03/28/15 2100  aztreonam (AZACTAM) 1 g in dextrose 5 % 50 mL IVPB  Status:  Discontinued     1 g 100 mL/hr over 30 Minutes Intravenous 3 times per day 03/28/15 1713 03/31/15 0933   03/28/15 1700  levofloxacin (LEVAQUIN) IVPB 750 mg  Status:  Discontinued     750 mg 100 mL/hr over  90 Minutes Intravenous  Once 03/28/15 1645 03/28/15 1656   03/28/15 1700  metroNIDAZOLE (FLAGYL) IVPB 500 mg  Status:  Discontinued     500 mg 100 mL/hr over 60 Minutes Intravenous  Once 03/28/15 1645 03/28/15 1658   03/28/15 1700  metroNIDAZOLE (FLAGYL) IVPB 500 mg  Status:  Discontinued     500 mg 100 mL/hr over 60 Minutes Intravenous Every 8 hours 03/28/15 1658 03/31/15 0940   03/28/15 1245  vancomycin (VANCOCIN) 1,500 mg in sodium chloride 0.9 % 500 mL IVPB     1,500 mg 250 mL/hr over 120 Minutes Intravenous STAT 03/28/15 1235 03/28/15 1649   03/28/15 1230  levofloxacin (LEVAQUIN) IVPB 750 mg     750 mg 100 mL/hr over 90 Minutes Intravenous  Once 03/28/15 1228 03/28/15 1440   03/28/15 1230  aztreonam (AZACTAM) 2 g in dextrose 5 % 50 mL IVPB     2 g 100 mL/hr over 30 Minutes Intravenous  Once 03/28/15  1228 03/28/15 1354   03/28/15 1230  vancomycin (VANCOCIN) IVPB 1000 mg/200 mL premix  Status:  Discontinued     1,000 mg 200 mL/hr over 60 Minutes Intravenous  Once 03/28/15 1228 03/28/15 1234      Assessment/Plan: Patient Active Problem List   Diagnosis Date Noted  . Abnormal LFTs   . Biliary sepsis   . Acute respiratory failure   . Severe sepsis   . Bacteremia due to Gram-negative bacteria   . Hiatal hernia   . Sepsis 03/28/2015  . Incisional hernia, without obstruction or gangrene 08/17/2014  . Atrial flutter 07/28/2014  . Palpitation 06/16/2014  . Pain in the chest 06/10/2014  . Right knee DJD 03/11/2014  . S/P CABG x 4 09/03/2013  . Insomnia 07/27/2013  . Testosterone deficiency 07/27/2013  . Osteoarthritis of right knee 07/27/2013  . Hyperlipidemia 06/10/2013  . Heart murmur 06/10/2013  . Dyspnea 06/10/2013  . Inguinal hernia, left. 04/04/2012  . Paraesophageal hiatal hernia 03/24/2012  . Prostate cancer 01/15/2012  . Special screening for malignant neoplasms, colon 01/15/2012    NO SIGNS OF ACUTE CHOLECYSTITIS BILIRUBIN NOW 10    MAY NEED EVALUATION OF BILIARY TREE AT SOME POINT  GI FOLLOWING NO ACUTE SURGICAL NEEDS AT THIS POINT UNTIL MEDICAL ISSUES IMPROVE    LOS: 5 days    Bernette Seeman A. 04/02/2015

## 2015-04-02 NOTE — Progress Notes (Signed)
Patient heart rate has started going into 120-150's. EKG obtained. Patient asymptomatic.B/P 114/76 resp 19 even and unlabored. O2sats 96% on 2l per n/c.Dr.Alva called. Lopressor IV ordered. [facility] [employer]

## 2015-04-02 NOTE — Progress Notes (Signed)
eLink Physician-Brief Progress Note Patient Name: [name] DOB: 25-Apr-1927 MRN: [id number]   Date of Service  04/02/2015  HPI/Events of Note  s tachy - asymptomatic On home lopressor  eICU Interventions  Use 5 mg IV prn HR > 120     Intervention Category Intermediate Interventions: Arrhythmia - evaluation and management  Laurianne Floresca V. 04/02/2015, 6:37 AM

## 2015-04-02 NOTE — Consult Note (Signed)
CARDIOLOGY CONSULT NOTE  Patient ID: [name] MRN: [id number] DOB/AGE: 09/18/1926 81 y.o.  Admit date: 03/28/2015 Primary Physician [name], MD Primary Cardiologist Dr. Percival Spanish Chief Complaint  Atrial fib  HPI:  The patient was admitted with fever and abdominal pain.   Had E coli bactermia.   He had acute respiratory failure requiring intubation.  He has had acute on chronic renal dysfunction.    We are consulted to manage atrial fib with RVR.  He has chronic atrial fib.  Currently he is off of anticoagulation secondary to thrombocytopenia and pending MRCP.    He went into atrial flutter with rate of 130 and 2:1 conduction today.  Started on IV Cardizem with now conversion to sinus.  He does have a mildly elevated troponin.  He denies any recent cardiac symptoms prior to his presentation.  The patient denies any new symptoms such as chest discomfort, neck or arm discomfort. There has been no new shortness of breath, PND or orthopnea. There have been no reported palpitations, presyncope or syncope.  Past Medical History  Diagnosis Date  . Prostate cancer   . GERD (gastroesophageal reflux disease)   . Hypertension   . Hyperlipemia   . Arthritis   . Inguinal hernia     left  . Coronary artery disease   . Dysrhythmia   . Shortness of breath   . Pneumonia   . H/O hiatal hernia   . CKD (chronic kidney disease) 08/2013    stage 3    Past Surgical History  Procedure Laterality Date  . Hernia repair    . Prostate surgery    . Colonoscopy  2003, 01/2012    non-bleeding cecal AVMs, descending and sigmoid tics.   . Coronary artery bypass graft N/A 09/03/2013    Procedure: CORONARY ARTERY BYPASS GRAFTING times four using left internal mammary and bilateral saphenous vein.;  Surgeon: Ivin Poot, MD;  Location: [place];  Service: Open Heart Surgery;  Laterality: N/A;  . Intraoperative transesophageal echocardiogram N/A 09/03/2013    Procedure: INTRAOPERATIVE TRANSESOPHAGEAL  ECHOCARDIOGRAM;  Surgeon: Ivin Poot, MD;  Location: [place];  Service: Open Heart Surgery;  Laterality: N/A;  . Coronary angioplasty    . Cataract extraction Bilateral   . Total knee arthroplasty Right 03/11/2014    Procedure: TOTAL KNEE ARTHROPLASTY;  Surgeon: Hessie Dibble, MD;  Location: [place];  Service: Orthopedics;  Laterality: Right;  . Left heart catheterization with coronary angiogram N/A 08/03/2013    Procedure: LEFT HEART CATHETERIZATION WITH CORONARY ANGIOGRAM;  Surgeon: Blane Ohara, MD;  Location: [facility] C[facility] LAB;  Service: Cardiovascular;  Laterality: N/A;  . Right thoracotomy  As a young child, ? 25 years old    for pneumonia   . Esophagogastroduodenoscopy  01/2012    11 cm sliding hiatal hernia.    Allergies  Allergen Reactions  . Lipitor [Atorvastatin] Other (See Comments)    "feel bad"  . Penicillins Itching   Prescriptions prior to admission  Medication Sig Dispense Refill Last Dose  . amLODipine (NORVASC) 5 MG tablet Take 1 tablet (5 mg total) by mouth daily. 90 tablet 1 03/27/2015 at Unknown time  . apixaban (ELIQUIS) 2.5 MG TABS tablet Take 1 tablet (2.5 mg total) by mouth 2 (two) times daily. 60 tablet 6 03/27/2015 at Unknown time  . Cholecalciferol (VITAMIN D3) 1000 UNITS CAPS Take 1,000 Units by mouth daily.    03/27/2015 at Unknown time  . hydrochlorothiazide (HYDRODIURIL) 25 MG tablet  Take 0.5 tablets (12.5 mg total) by mouth daily. 90 tablet 3 03/27/2015 at Unknown time  . levothyroxine (SYNTHROID, LEVOTHROID) 25 MCG tablet Take 1 tablet (25 mcg total) by mouth daily before breakfast. 30 tablet 9 03/27/2015 at Unknown time  . lisinopril (PRINIVIL,ZESTRIL) 40 MG tablet Take 0.5 tablets (20 mg total) by mouth daily. 90 tablet 3 03/27/2015 at Unknown time  . metoprolol succinate (TOPROL-XL) 50 MG 24 hr tablet Take 1 tablet (50 mg total) by mouth daily. 90 tablet 3 03/27/2015 at 600  . omeprazole (PRILOSEC) 40 MG capsule Take 1 capsule (40 mg total) by mouth daily. 90 capsule  3 03/27/2015 at Unknown time  . oxybutynin (DITROPAN) 5 MG tablet Take 1 tablet (5 mg total) by mouth daily. 90 tablet 3 03/27/2015 at Unknown time  . pramipexole (MIRAPEX) 1 MG tablet Take 1 tablet (1 mg total) by mouth at bedtime. 90 tablet 3 03/27/2015 at Unknown time  . pravastatin (PRAVACHOL) 40 MG tablet Take 1 tablet (40 mg total) by mouth daily. 90 tablet 3 03/27/2015 at Unknown time  . testosterone cypionate (DEPOTESTOTERONE CYPIONATE) 200 MG/ML injection Inject 300 mg into the muscle every 28 (twenty-eight) days.   unk   Family History  Problem Relation Age of Onset  . Colon cancer Neg Hx   . Cancer Mother     bone marrow    Social History   Social History  . Marital Status: Widowed    Spouse Name: N/A  . Number of Children: 1  . Years of Education: N/A   Occupational History  . Retired     Social History Main Topics  . Smoking status: Former Smoker -- 1.00 packs/day    Types: Cigarettes    Quit date: 08/20/1978  . Smokeless tobacco: Never Used  . Alcohol Use: No  . Drug Use: No  . Sexual Activity: Not on file   Other Topics Concern  . Not on file   Social History Narrative   No caffeine      ROS:    As stated in the HPI and negative for all other systems.  Physical Exam: Blood pressure 142/58, pulse 79, temperature 97.7 F (36.5 C), temperature source Oral, resp. rate 13, height 5\' 11"  (1.803 m), weight 184 lb 6.4 oz (83.643 kg), SpO2 98 %.  GENERAL:  Chronically ill appearing HEENT:  Pupils equal round and reactive, fundi not visualized, oral mucosa unremarkable, scleral ictera. NECK:  No jugular venous distention, waveform within normal limits, carotid upstroke brisk and symmetric, no bruits, no thyromegaly LYMPHATICS:  No cervical, inguinal adenopathy LUNGS:  Clear to auscultation bilaterally BACK:  No CVA tenderness CHEST:  Unremarkable HEART:  PMI not displaced or sustained,S1 and S2 within normal limits, no S3, no S4, no clicks, no rubs, 2/6 early peaking  systolic murmur with slight radiation out the outflow tract, no diastolic murmurs ABD:  Flat, positive bowel sounds normal in frequency in pitch, no bruits, no rebound, no guarding, no midline pulsatile mass, no hepatomegaly, no splenomegaly EXT:  2 plus pulses throughout,  no cyanosis no clubbing, diffuse edema.  SKIN:  No rashes no nodules NEURO:  Cranial nerves II through XII grossly intact, motor grossly intact throughout PSYCH:  Cognitively intact, oriented to person place and time   Labs: Lab Results  Component Value Date   BUN 52* 04/02/2015   Lab Results  Component Value Date   CREATININE 3.72* 04/02/2015   Lab Results  Component Value Date   NA 132* 04/02/2015  K 3.6 04/02/2015   CL 106 04/02/2015   CO2 18* 04/02/2015   Lab Results  Component Value Date   TROPONINI 0.58* 04/02/2015   Lab Results  Component Value Date   WBC 10.5 04/02/2015   HGB 9.8* 04/02/2015   HCT 27.8* 04/02/2015   MCV 86.9 04/02/2015   PLT 94* 04/02/2015   Lab Results  Component Value Date   CHOL 134 12/28/2014   HDL 41 12/28/2014   LDLCALC 89 04/13/2014   TRIG 133 12/28/2014   Lab Results  Component Value Date   ALT 101* 04/02/2015   AST 47* 04/02/2015   ALKPHOS 213* 04/02/2015   BILITOT 10.6* 04/02/2015    EKG:   Atrial flutter, rate 130, LAD.  Poor anterior R wave progression, no acute ST T wave changes.    ASSESSMENT AND PLAN:   ATRIAL FIB:  Converted back to NSR.  Continue low dose IV Cardizem today.  I will convert to PO in the AM.  Holding anticoagulation for now.    CAD:  Mild troponin elevation.  I would suspect demand ischemia.  I will not be planning an invasive evaluation.      SignedMinus Breeding 04/02/2015, 2:19 PM

## 2015-04-02 NOTE — Progress Notes (Signed)
Progress Note for [facility]  Subjective: No complaints.  Feeling better now than compared to this AM.  Objective: Vital signs in last 24 hours: Temp:  [97.6 F (36.4 C)-98.8 F (37.1 C)] 97.6 F (36.4 C) (08/13 0420) Pulse Rate:  [60-165] 80 (08/13 1109) Resp:  [13-34] 14 (08/13 1109) BP: (79-167)/(32-96) 126/68 mmHg (08/13 1109) SpO2:  [93 %-99 %] 99 % (08/13 1109) Weight:  [81.829 kg (180 lb 6.4 oz)-83.643 kg (184 lb 6.4 oz)] 83.643 kg (184 lb 6.4 oz) (08/13 0420) Last BM Date: 03/31/15  Intake/Output from previous day: 08/12 0701 - 08/13 0700 In: 510 [P.O.:240; I.V.:270] Out: 1415 [Urine:1415] Intake/Output this shift: Total I/O In: 270 [P.O.:270] Out: 1 [Stool:1]  General appearance: alert and no distress GI: soft, non-tender; bowel sounds normal; no masses,  no organomegaly  Lab Results:  Recent Labs  03/31/15 0400 04/01/15 0227 04/02/15 0553  WBC 17.7* 13.7* 10.5  HGB 9.9* 10.2* 9.8*  HCT 28.1* 28.4* 27.8*  PLT 94* 86* 94*   BMET  Recent Labs  03/31/15 1845 04/01/15 0227 04/02/15 0553  NA 130* 133* 132*  K 4.7 3.4* 3.6  CL 103 107 106  CO2 14* 18* 18*  GLUCOSE 103* 84 95  BUN 50* 46* 52*  CREATININE 3.46* 3.61* 3.72*  CALCIUM 8.1* 8.4* 8.7*   LFT  Recent Labs  03/31/15 1215  04/02/15 0553  PROT 5.4*  < > 5.1*  ALBUMIN 2.4*  < > 2.2*  AST 58*  < > 47*  ALT 174*  < > 101*  ALKPHOS 210*  < > 213*  BILITOT 7.7*  < > 10.6*  BILIDIR 5.2*  --   --   IBILI 2.5*  --   --   < > = values in this interval not displayed. PT/INR  Recent Labs  04/02/15 0553  LABPROT 17.0*  INR 1.37   Hepatitis Panel No results for input(s): HEPBSAG, HCVAB, HEPAIGM, HEPBIGM in the last 72 hours. C-Diff No results for input(s): CDIFFTOX in the last 72 hours. Fecal Lactopherrin No results for input(s): FECLLACTOFRN in the last 72 hours.  Studies/Results: No results found.  Medications:  Scheduled: . ciprofloxacin  400 mg Intravenous Q24H  . feeding  supplement (ENSURE ENLIVE)  237 mL Oral BID BM  . levothyroxine  25 mcg Oral QAC breakfast  . pantoprazole  40 mg Oral Q0600  . pramipexole  1 mg Oral QHS  . sodium chloride  3 mL Intravenous Q12H   Continuous: . dextrose 5 % and 0.45% NaCl 10 mL/hr at 04/01/15 1100  . diltiazem (CARDIZEM) infusion 5 mg/hr (04/02/15 0959)    Assessment/Plan: 1) Hyperbilirubinemia. 2) Renal insufficiency.   The patient's TB has increased.  I will order an MRCP for further evaluation.  Plan: 1) Await MRCP. 2) Follow liver panel.    LOS: 5 days   [name] D 04/02/2015, 11:49 AM

## 2015-04-02 NOTE — Progress Notes (Signed)
S: Denies abd pain.  No SOB O:BP 114/76 mmHg  Pulse 129  Temp(Src) 97.6 F (36.4 C) (Oral)  Resp 14  Ht 5\' 11"  (1.803 m)  Wt 83.643 kg (184 lb 6.4 oz)  BMI 25.73 kg/m2  SpO2 96%  Intake/Output Summary (Last 24 hours) at 04/02/15 0717 Last data filed at 04/02/15 0523  Gross per 24 hour  Intake    510 ml  Output   1415 ml  Net   -905 ml   Weight change: 1.829 kg (4 lb 0.5 oz) YDX:AJOIN and alert OMV:EHMCN, irreg,irreg Resp:bibasilar crackles Abd:+ BS NTND Ext: no edema NEURO:CNI Ox3 no asterixis   . ciprofloxacin  400 mg Intravenous Q24H  . diltiazem  30 mg Oral Q12H  . feeding supplement (ENSURE ENLIVE)  237 mL Oral BID BM  . levothyroxine  25 mcg Oral QAC breakfast  . pantoprazole  40 mg Oral Q0600  . pramipexole  1 mg Oral QHS  . sodium chloride  3 mL Intravenous Q12H   No results found. BMET    Component Value Date/Time   NA 132* 04/02/2015 0553   NA 138 12/28/2014 0952   K 3.6 04/02/2015 0553   CL 106 04/02/2015 0553   CO2 18* 04/02/2015 0553   GLUCOSE 95 04/02/2015 0553   GLUCOSE 84 12/28/2014 0952   BUN 52* 04/02/2015 0553   BUN 22 12/28/2014 0952   CREATININE 3.72* 04/02/2015 0553   CREATININE 1.33 02/17/2013 1108   CALCIUM 8.7* 04/02/2015 0553   GFRNONAA 13* 04/02/2015 0553   GFRNONAA 49* 02/17/2013 1108   GFRAA 16* 04/02/2015 0553   GFRAA 56* 02/17/2013 1108   CBC    Component Value Date/Time   WBC 13.7* 04/01/2015 0227   WBC 7.8 12/28/2014 1006   WBC 7.9 07/28/2014 1343   RBC 3.22* 04/01/2015 0227   RBC 4.90 12/28/2014 1006   RBC 4.74 07/28/2014 1343   HGB 10.2* 04/01/2015 0227   HGB 14.3 12/28/2014 1006   HCT 28.4* 04/01/2015 0227   HCT 44.6 12/28/2014 1006   PLT 86* 04/01/2015 0227   MCV 88.2 04/01/2015 0227   MCV 90.9 12/28/2014 1006   MCH 31.7 04/01/2015 0227   MCH 29.1 12/28/2014 1006   MCH 28.9 07/28/2014 1343   MCHC 35.9 04/01/2015 0227   MCHC 32.0 12/28/2014 1006   MCHC 31.5 07/28/2014 1343   RDW 14.3 04/01/2015 0227   RDW 14.8 07/28/2014 1343   LYMPHSABS 0.4* 03/31/2015 0400   LYMPHSABS 1.3 07/28/2014 1343   MONOABS 0.9 03/31/2015 0400   EOSABS 0.0 03/31/2015 0400   EOSABS 0.2 07/28/2014 1343   BASOSABS 0.0 03/31/2015 0400   BASOSABS 0.0 07/28/2014 1343     Assessment:  1. AKI sec ischemic ATN in face of ACE,  Hs bili of 10.6 is an additional tubular toxin. UO good, Scr sl up 2. E Coli bacteremia 3. A fib with RVR Plan: 1. Renal diet 2. Consider cardio seeing for A Fib 3. Daily Scr  [place] T

## 2015-04-02 NOTE — Progress Notes (Signed)
Kathline Magic NP notified of patient has converted to NSR with 1st degree Hb. EKG obtained. Patient remains on cardizem at 5mg /hr IV. VSS. Pt resting quietly with no c/o's. [facility]

## 2015-04-02 NOTE — Progress Notes (Signed)
CRITICAL VALUE ALERT  Critical value received:  Troponin 0.58  Date of notification:  04/02/15  Time of notification:  3546  Critical value read back:Yes.    Nurse who received alert:  Donnella Bi, RN  MD notified (1st page):  Dr. Tana Coast  Time of first page:  1324  Responding MD:  Dr. Tana Coast  Time MD responded:  1326   Will continue to monitor patient. He is stable at this time.

## 2015-04-02 NOTE — Progress Notes (Signed)
[facility]                                                                              Patient Demographics  [name], is a 81 y.o. male, DOB - [birth year], ESP:[id number]  Admit date - 03/28/2015   Admitting Physician Donne Hazel, MD  Outpatient Primary MD for the patient is [name], MD  LOS - 5   Chief Complaint  Patient presents with  . Hypoglycemia  . 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 He had been seen at an urgent care the day prior for epigastric pain.   On EMS arrival to the home, the patient was found to be hypotensive and hypoglycemic which responded to IVF's and dextrose. The patient reported of approximately 48 hours of epigastric pain, malaise and fevers. Initial work up showed temp 101, WBC 22.8, tachycardia, hypotension, AST/ALT 454/521 / bilirubin 6.1, sr cr 2.32, Na 129 and a lactic acid of 4.56. Initial CXR showed a large hiatal hernia but no edema or consolidation. An ultrasound of the abdomen was obtained and revealed an abnormal appearance of the gallbladder with contraction, [place] thickening and nodularity without discrete. Follow up CT of the abdomen was assessed and showed an large, non-obstructing hiatal hernia with intrathoracic stomach dilatation, colon diverticulosis, peri-nephric stranding and free fluid in the pelvis. The patient was admitted per [facility]. He was also evaluated by GI and CCS who both feel findings are consistent with cholangitis and sepsis. The patient was volume resuscitated, treated with abx (aztreonam/vanco in ER, then to levaquin / flagyl), pan cultured and monitored on SDU. Unfortunately, the afternoon of 8/9, he deteriorated and required emergent  intubation with transfer to ICU.  STUDIES:/EVENTS 8/08 Admitted with abdominal pain, hypotension. 8/08 [place] ABD >> abnormal appearance of the gallbladder with contraction, [place] thickening and nodularity, no discrete stones, no other acute abnormality. CBD 8.5 mm 8/08 CT ABD/Pelvis >> gaseous distention of the stomach, large hiatal hernia, small high attenuation structure in the distal common bile duct (? Choledocholithiasis or small periampullary duodenum diverticulum), non-specific perinephric stranding, small volume free fluid within the pelvis  03/29/15: intubated. Moved to ICU 03/30/15: Not on pressors. Extubated. Repeated transient asymptomatic hypoglycemia per RN. He has denued abd pain to CCS and GI. CCS suspecting CBD stone with obstruction based on LFT but d/w DR Carlean Purl of GI - he reviewed CT with radiology - CBD stone/dilatation presence not convincing. GI Blood culture growing Gram Negativ Coccobacilli   Assessment & Plan    Principal Problem:  Severe Sepsis with Escherichia coli bacteremia: Unclear etiology possibly gallbladder versus bowel translocation - Continue IV ciprofloxacin currently stable, not spiking any fevers  Active Problems:   atrial fibrillation with RVR - Patient has diagnoses of atrial fibrillation and on anticoagulation outpatient. This morning noticed to be in rapid A. fib, given one  dose of IV Cardizem 10 mg 1 and placed on IV Cardizem drip.  - Continue to hold eliquis in case GI procedures are planned. - Cannot be on anticoagulation due to thrombocytopenia - Cardiology consulted - Patient had an echo on 8/10, showed EF of 12-45%, grade 2 diastolic dysfunction    Acute hypercarbia respiratory failure - Extubated on 03/30/15, continue as needed albuterol  Acute on chronic kidney disease- Likely worsened due to ATN, new paraphimosis - Nephrology following  Transaminitis with transient biliary obstruction, cholecystitis, Escherichia coli bacteremia -  Currently tolerating diet, general surgery, gastroenterology following - Per GI, can do MRCP without contrast once bilirubin starts trending down  Anemia, thrombocytopenia -Continue to hold anticoagulation, monitor CBC closely    Code Status:Full code   Family Communication: Discussed in detail with the patient, all imaging results, lab results explained to the patient    Disposition Plan:Not medically ready  Time Spent in minutes   25 minutes  Procedures   Mechanical ventilation Ultrasound abdomen CT abdomen  Consults   Cardiology  General surgery  GI  CCM  DVT Prophylaxis  SCD's  Medications  Scheduled Meds: . ciprofloxacin  400 mg Intravenous Q24H  . feeding supplement (ENSURE ENLIVE)  237 mL Oral BID BM  . levothyroxine  25 mcg Oral QAC breakfast  . pantoprazole  40 mg Oral Q0600  . pramipexole  1 mg Oral QHS  . sodium chloride  3 mL Intravenous Q12H   Continuous Infusions: . dextrose 5 % and 0.45% NaCl 10 mL/hr at 04/01/15 1100  . diltiazem (CARDIZEM) infusion 5 mg/hr (04/02/15 0959)   PRN Meds:.albuterol, metoprolol   Antibiotics   Anti-infectives    Start     Dose/Rate Route Frequency Ordered Stop   03/31/15 1000  ciprofloxacin (CIPRO) IVPB 400 mg     400 mg 200 mL/hr over 60 Minutes Intravenous Every 24 hours 03/31/15 0933     03/30/15 1400  vancomycin (VANCOCIN) IVPB 1000 mg/200 mL premix  Status:  Discontinued     1,000 mg 200 mL/hr over 60 Minutes Intravenous Every 48 hours 03/30/15 0751 03/31/15 0933   03/30/15 1300  levofloxacin (LEVAQUIN) IVPB 500 mg  Status:  Discontinued     500 mg 100 mL/hr over 60 Minutes Intravenous Every 48 hours 03/28/15 1407 03/28/15 1645   03/30/15 1230  levofloxacin (LEVAQUIN) IVPB 500 mg  Status:  Discontinued     500 mg 100 mL/hr over 60 Minutes Intravenous Every 48 hours 03/28/15 1707 03/29/15 1619   03/29/15 1500  vancomycin (VANCOCIN) IVPB 750 mg/150 ml premix  Status:  Discontinued     750 mg 150 mL/hr over  60 Minutes Intravenous Every 24 hours 03/28/15 1407 03/28/15 1645   03/29/15 1400  vancomycin (VANCOCIN) IVPB 750 mg/150 ml premix  Status:  Discontinued     750 mg 150 mL/hr over 60 Minutes Intravenous Every 24 hours 03/28/15 1713 03/30/15 0750   03/29/15 1230  levofloxacin (LEVAQUIN) IVPB 750 mg  Status:  Discontinued     750 mg 100 mL/hr over 90 Minutes Intravenous  Once 03/28/15 1656 03/28/15 1707   03/28/15 2100  aztreonam (AZACTAM) 1 g in dextrose 5 % 50 mL IVPB  Status:  Discontinued     1 g 100 mL/hr over 30 Minutes Intravenous Every 8 hours 03/28/15 1407 03/28/15 1645   03/28/15 2100  aztreonam (AZACTAM) 1 g in dextrose 5 % 50 mL IVPB  Status:  Discontinued     1 g 100 mL/hr over  30 Minutes Intravenous 3 times per day 03/28/15 1713 03/31/15 0933   03/28/15 1700  levofloxacin (LEVAQUIN) IVPB 750 mg  Status:  Discontinued     750 mg 100 mL/hr over 90 Minutes Intravenous  Once 03/28/15 1645 03/28/15 1656   03/28/15 1700  metroNIDAZOLE (FLAGYL) IVPB 500 mg  Status:  Discontinued     500 mg 100 mL/hr over 60 Minutes Intravenous  Once 03/28/15 1645 03/28/15 1658   03/28/15 1700  metroNIDAZOLE (FLAGYL) IVPB 500 mg  Status:  Discontinued     500 mg 100 mL/hr over 60 Minutes Intravenous Every 8 hours 03/28/15 1658 03/31/15 0940   03/28/15 1245  vancomycin (VANCOCIN) 1,500 mg in sodium chloride 0.9 % 500 mL IVPB     1,500 mg 250 mL/hr over 120 Minutes Intravenous STAT 03/28/15 1235 03/28/15 1649   03/28/15 1230  levofloxacin (LEVAQUIN) IVPB 750 mg     750 mg 100 mL/hr over 90 Minutes Intravenous  Once 03/28/15 1228 03/28/15 1440   03/28/15 1230  aztreonam (AZACTAM) 2 g in dextrose 5 % 50 mL IVPB     2 g 100 mL/hr over 30 Minutes Intravenous  Once 03/28/15 1228 03/28/15 1354   03/28/15 1230  vancomycin (VANCOCIN) IVPB 1000 mg/200 mL premix  Status:  Discontinued     1,000 mg 200 mL/hr over 60 Minutes Intravenous  Once 03/28/15 1228 03/28/15 1234        Subjective:   [name]  was seen and examined today. Noted patient and rapid atrial fibrillation, complaining of dizziness and shortness of breath. No chest pain. Heart rate in 150s.  Patient denies dizziness, chest pain, abdominal pain, N/V/D/C, new weakness, numbess, tingling  Objective:   Blood pressure 106/60, pulse 96, temperature 97.6 F (36.4 C), temperature source Oral, resp. rate 18, height 5\' 11"  (1.803 m), weight 83.643 kg (184 lb 6.4 oz), SpO2 99 %.  Wt Readings from Last 3 Encounters:  04/02/15 83.643 kg (184 lb 6.4 oz)  02/28/15 72.213 kg (159 lb 3.2 oz)  01/26/15 73.936 kg (163 lb)     Intake/Output Summary (Last 24 hours) at 04/02/15 1103 Last data filed at 04/02/15 0900  Gross per 24 hour  Intake    580 ml  Output    916 ml  Net   -336 ml    Exam  General: Alert and oriented x 3, NAD  HEENT:  PERRLA, EOMI, Anicteric Sclera, mucous membranes moist.   Neck: Supple, no JVD, no masses  ZOX:[id number] irregular, tachycardia   Respiratory: Clear to auscultation bilaterally, no wheezing, rales or rhonchi  Abdomen: Soft, nontender, nondistended, + bowel sounds  Ext: no cyanosis clubbing or edema  Neuro: AAOx3, Cr N's II- XII. Strength 5/5 upper and lower extremities bilaterally  Skin: No rashes  Psych: Normal affect and demeanor, alert and oriented x3    Data Review   Micro Results Recent Results (from the past 240 hour(s))  Blood Culture (routine x 2)     Status: None   Collection Time: 03/28/15 12:40 PM  Result Value Ref Range Status   Specimen Description BLOOD RIGHT ANTECUBITAL  Final   Special Requests BOTTLES DRAWN AEROBIC AND ANAEROBIC 5CC  Final   Culture  Setup Time   Final    GRAM NEGATIVE COCCOBACILLI IN BOTH AEROBIC AND ANAEROBIC BOTTLES CRITICAL RESULT CALLED TO, READ BACK BY AND VERIFIED WITH: C GOSS RN 2300 03/28/15 A BROWNING    Culture ESCHERICHIA COLI  Final   Report Status 03/30/2015 FINAL  Final   Organism ID, Bacteria ESCHERICHIA COLI  Final       Susceptibility   Escherichia coli - MIC*    AMPICILLIN >=32 RESISTANT Resistant     CEFAZOLIN 8 SENSITIVE Sensitive     CEFEPIME <=1 SENSITIVE Sensitive     CEFTAZIDIME <=1 SENSITIVE Sensitive     CEFTRIAXONE <=1 SENSITIVE Sensitive     CIPROFLOXACIN <=0.25 SENSITIVE Sensitive     GENTAMICIN <=1 SENSITIVE Sensitive     IMIPENEM <=0.25 SENSITIVE Sensitive     TRIMETH/SULFA <=20 SENSITIVE Sensitive     AMPICILLIN/SULBACTAM >=32 RESISTANT Resistant     PIP/TAZO 64 INTERMEDIATE Intermediate     * ESCHERICHIA COLI  Blood Culture (routine x 2)     Status: None   Collection Time: 03/28/15 12:54 PM  Result Value Ref Range Status   Specimen Description BLOOD RIGHT HAND  Final   Special Requests BOTTLES DRAWN AEROBIC AND ANAEROBIC 3CC  Final   Culture  Setup Time   Final    GRAM NEGATIVE RODS IN BOTH AEROBIC AND ANAEROBIC BOTTLES CRITICAL RESULT CALLED TO, READ BACK BY AND VERIFIED WITH: B LEAP,RN [id number] [place]    Culture   Final    ESCHERICHIA COLI SUSCEPTIBILITIES PERFORMED ON PREVIOUS CULTURE WITHIN THE LAST 5 DAYS.    Report Status 03/30/2015 FINAL  Final  Urine culture     Status: None   Collection Time: 03/28/15  1:00 PM  Result Value Ref Range Status   Specimen Description URINE, CATHETERIZED  Final   Special Requests NONE  Final   Culture NO GROWTH 2 DAYS  Final   Report Status 03/30/2015 FINAL  Final  MRSA PCR Screening     Status: None   Collection Time: 03/29/15  3:18 AM  Result Value Ref Range Status   MRSA by PCR NEGATIVE NEGATIVE Final    Comment:        The GeneXpert MRSA Assay (FDA approved for NASAL specimens only), is one component of a comprehensive MRSA colonization surveillance program. It is not intended to diagnose MRSA infection nor to guide or monitor treatment for MRSA infections.   Culture, respiratory (NON-Expectorated)     Status: None   Collection Time: 03/29/15  4:27 PM  Result Value Ref Range Status   Specimen Description TRACHEAL  ASPIRATE  Final   Special Requests NONE  Final   Gram Stain   Final    FEW WBC PRESENT,BOTH PMN AND MONONUCLEAR RARE SQUAMOUS EPITHELIAL CELLS PRESENT RARE GRAM POSITIVE COCCI IN PAIRS Performed at [facility]    Culture   Final    Non-Pathogenic Oropharyngeal-type Flora Isolated. Performed at [facility]    Report Status 03/31/2015 FINAL  Final    Radiology Reports Ct Abdomen Pelvis Wo Contrast  03/28/2015   CLINICAL DATA:  81 year old male with generalized weakness, low blood sugar and epigastric pain  EXAM: CT ABDOMEN AND PELVIS WITHOUT CONTRAST  TECHNIQUE: Multidetector CT imaging of the abdomen and pelvis was performed following the standard protocol without IV contrast.  COMPARISON:  Abdominal ultrasound performed earlier today  FINDINGS: Lower Chest: Massive hiatal hernia. Ingested oral contrast material lays within the partially intra thoracic stomach which is significantly dilated with gas. The heart is within normal limits for size. Atherosclerotic calcifications present throughout the visualized coronary arteries. No pericardial effusion. Chronic atelectasis in the bilateral lower lungs worse on the left than the right around the large hiatal hernia. Otherwise, the visualized lower lungs are essentially clear. Focal  subpleural steatosis adjacent to the right middle lobe.  Abdomen: Unenhanced CT was performed per clinician order. Lack of IV contrast limits sensitivity and specificity, especially for evaluation of abdominal/pelvic solid viscera. Mild gaseous distension of the intra-abdominal portion of the stomach. Unremarkable CT appearance of the spleen, adrenal glands and pancreas. The gallbladder is contracted. There is a mild biliary ductal dilatation. A 6 x 8 mm high attenuation structure either within or immediately adjacent to the distal common bile duct. It is unclear if this represents a small periampullary duodenum diverticulum or choledocholithiasis.  The liver  is otherwise normal in size and contour. No definite liver lesion given the limitations of absent IV contrast. Nonspecific perinephric stranding bilaterally. No hydronephrosis. No nephrolithiasis. No free fluid or suspicious adenopathy. No evidence of a bowel obstruction or focal bowel Ludtke thickening. Moderate volume of formed stool suggests constipation. Colonic diverticular disease without CT evidence of active inflammation.  Pelvis: The bladder is decompressed. Surgical changes of prior prostatectomy. Trace free fluid layers dependently within the pelvis.  Bones/Soft Tissues: No acute fracture or aggressive appearing lytic or blastic osseous lesion. Advanced multilevel degenerative disc disease. Small left fat containing inguinal hernia. Dextro convex scoliosis of the lumbar spine.  Vascular: Scattered atherosclerotic vascular calcifications without evidence of aneurysm. Otherwise, evaluation significantly limited in the absence of intravenous contrast.  IMPRESSION: 1. The patient's mid epigastric pain may be related to gaseous distension of the stomach including the large hiatal hernia and intra thoracic portion of the stomach. As the stomach is incompletely imaged, a component of acute or chronic gastric volvulus is difficult to exclude entirely although is clear there is not complete obstruction given passage of ingested oral contrast material into the distal bowel. 2. Small high attenuation structure in the region of the distal common bile duct may represent choledocholithiasis or a small periampullary duodenum diverticulum. A small duodenum diverticulum is favored. Recommend clinical correlation for evidence of a biliary obstruction or right upper quadrant pain. If clinically warranted, MRCP could further evaluate. 3. Nonspecific perinephric stranding bilaterally. 4. Colonic diverticular disease without CT evidence of active inflammation. 5. Small volume free fluid layers within the pelvis. This is abnormal  in a male patient has and is likely secondary to an underlying infectious or inflammatory process. 6. Advanced multilevel degenerative disc disease with dextro convex scoliosis. 7. Scattered atherosclerotic vascular calcifications.   Electronically Signed   By: Jacqulynn Cadet M.D.   On: 03/28/2015 20:15   US Abdomen Complete  03/28/2015   CLINICAL DATA:  Elevated liver function studies, elevated lactate level, epigastric pain  EXAM: ULTRASOUND ABDOMEN COMPLETE  COMPARISON:  Abdominal ultrasound of December 13, 2011  FINDINGS: Gallbladder: The gallbladder is contracted and its [place]ke is irregular. Stones have been described in the past but discrete stones are not observed today.  Common bile duct: Diameter: 8.5 mm  Liver: The liver exhibits no focal mass nor ductal dilation. The surface contour is normal.  IVC: No abnormality visualized.  Pancreas: The pancreas was obscured by bowel gas.  Spleen: Size and appearance within normal limits.  Right Kidney: Length: 10.5 cm. Echogenicity within normal limits. No mass or hydronephrosis visualized.  Left Kidney: Length: 10.5 cm. Echogenicity within normal limits. No mass or hydronephrosis visualized.  Abdominal aorta: No aneurysm visualized.  Other findings: No ascites is demonstrated.  IMPRESSION: 1. Abnormal appearance of the gallbladder with contraction an [employer] thickening and nodularity. Stones may be present but are not discretely demonstrated. 2. No acute abnormality of the  liver, spleen, or kidneys. The pancreas was obscured by bowel gas. 3. Given the patient's laboratory findings and chief complaint, further evaluation with abdominal and pelvic CT scanning would be useful.   Electronically Signed   By: David  Martinique M.D.   On: 03/28/2015 15:53   Dg Chest Port 1 View  03/30/2015   CLINICAL DATA:  Acute respiratory failure.  EXAM: PORTABLE CHEST - 1 VIEW  COMPARISON:  03/29/2015.  FINDINGS: 1116 hours. Endotracheal tube tip is 3.5 cm above the carina. Nasogastric  tube projects below the diaphragm, tip not visualized. There is interval improved aeration of the lung bases with mild residual bibasilar atelectasis. No confluent airspace opacity, edema or significant pleural effusion. Cardiomegaly and a large hiatal hernia are again noted post CABG.  IMPRESSION: Interval improved aeration of the lung bases. Stable support system.   Electronically Signed   By: Richardean Sale M.D.   On: 03/30/2015 11:44   Dg Chest Port 1 View  03/29/2015   CLINICAL DATA:  Evaluate endotracheal tube position.  EXAM: PORTABLE CHEST - 1 VIEW  COMPARISON:  03/29/2015.  FINDINGS: Support apparatus: Endotracheal tube 3 cm from the carina. Enteric tube is present with the tip not visible. Side port is in the proximal gastric fundus. Monitoring leads project over the chest.  Cardiomediastinal Silhouette:  Unchanged.  Lungs: Bilateral basilar atelectasis, greater on the LEFT when compared to the RIGHT. No pneumothorax.  Effusions: RIGHT-greater-than-LEFT pleural effusions, likely partially loculated on the RIGHT.  Other:  Large hiatal hernia with intrathoracic gastric air bubble.  IMPRESSION: 1. Endotracheal tube tip 3 cm from the carina. 2. Nasogastric tube in the stomach. 3. Cardiomegaly with bilateral pleural effusions. 4. Large hiatal hernia.   Electronically Signed   By: Dereck Ligas M.D.   On: 03/29/2015 18:40   Dg Chest Port 1 View  03/29/2015   CLINICAL DATA:  Intubation  EXAM: PORTABLE CHEST - 1 VIEW  COMPARISON:  03/28/2015  FINDINGS: Endotracheal tube just above the carina. Recommend withdrawal of 3 cm.  Hypoventilation. Elevated left hemidiaphragm with left lower lobe atelectasis unchanged. Mild right lower lobe atelectasis and small bilateral effusions.  Improved aeration of the lungs compared with earlier today  IMPRESSION: Endotracheal tube at the carina, recommend withdrawal 3 cm.  Improved aeration in the lung bases. Persistent bibasilar atelectasis has improved. Small bilateral  pleural effusions.   Electronically Signed   By: Franchot Gallo M.D.   On: 03/29/2015 14:59   Dg Chest Port 1 View  03/28/2015   CLINICAL DATA:  Shortness of Breath  EXAM: PORTABLE CHEST - 1 VIEW  COMPARISON:  Study obtained earlier in the day  FINDINGS: Large hiatal type hernia is again identified with most of the stomach above the diaphragm. The degree of inspiration shallow. There is no frank edema or consolidation. No appreciable joint effusions. Heart is mildly prominent with pulmonary vascularity within normal limits. Patient is status post coronary artery bypass grafting. No adenopathy.  IMPRESSION: Large hiatal type hernia. No edema or consolidation. Degree of inspiration shallow. No new opacity. No change in cardiac silhouette.   Electronically Signed   By: Lowella Grip III M.D.   On: 03/28/2015 18:36   Dg Chest Portable 1 View  03/28/2015   CLINICAL DATA:  Shortness of breath today  EXAM: PORTABLE CHEST - 1 VIEW  COMPARISON:  03/27/2015  FINDINGS: Cardiac shadow is stable. A large hiatal hernia is again identified. Postsurgical changes are again seen. No focal infiltrate or sizable effusion  is noted.  IMPRESSION: Stable large hiatal hernia.  No acute abnormality noted.   Electronically Signed   By: Inez Catalina M.D.   On: 03/28/2015 11:45   Dg Abd Portable 1v  03/30/2015   CLINICAL DATA:  Abnormal LFT  EXAM: PORTABLE ABDOMEN - 1 VIEW  COMPARISON:  03/29/2015  FINDINGS: NG tube in the stomach. Normal bowel gas pattern. Contrast in the rectum from prior CT. Surgical clips in the pelvis.  Lumbar scoliosis and degenerative change.  No renal calculi.  IMPRESSION: Normal bowel gas pattern.  NG tube in the stomach.   Electronically Signed   By: Franchot Gallo M.D.   On: 03/30/2015 11:46   Dg Abd Portable 1v  03/29/2015   CLINICAL DATA:  Check NG tube position  EXAM: PORTABLE ABDOMEN - 1 VIEW  COMPARISON:  03/29/2015 at 1527 hours  FINDINGS: Enteric tube terminates below the diaphragm in the gastric  body.  Nonobstructive bowel gas pattern.  Nodule contrast in the distal colon.  IMPRESSION: Enteric tube terminates below the diaphragm in the gastric body.   Electronically Signed   By: Julian Hy M.D.   On: 03/29/2015 16:55   Dg Abd Portable 1v  03/29/2015   CLINICAL DATA:  NG tube placement.  EXAM: PORTABLE ABDOMEN - 1 VIEW  COMPARISON:  CT scan 03/28/2015.  FINDINGS: The NG tube tip is in the lower aspect of the chest. This is likely in the distal esophagus or possibly the large hiatal hernia. The proximal port is in the mid esophagus.  The stomach demonstrates gaseous distention. There is contrast in the bowel.  IMPRESSION: The NG tube tip is in the distal esophagus or large hiatal hernia.   Electronically Signed   By: Marijo Sanes M.D.   On: 03/29/2015 15:55   US Abdomen Limited Ruq  03/31/2015   CLINICAL DATA:  Questionable choledocholithiasis on recent CT  EXAM: US ABDOMEN LIMITED - RIGHT UPPER QUADRANT  COMPARISON:  Abdominal ultrasound and CT abdomen and pelvis March 28, 2015  FINDINGS: Gallbladder:  The gallbladder is somewhat contracted. There are nonshadowing echogenic foci in the gallbladder similar to 1 day prior. The gallbladder [place] is upper normal in thickness. There is no pericholecystic fluid. No sonographic Murphy sign noted.  Common bile duct:  Diameter: 6 mm. There is no mass or calculus seen in the visualized portions of the common bile duct. Portions of the common bile duct are obscured by gas.  Liver:  No focal lesion identified. Within normal limits in parenchymal echogenicity.  IMPRESSION: Note that the study is less than optimal due to patient's inability to suspend respiration significantly. No choledocholithiasis is seen by ultrasound. Portions of the common bile duct, however, are not well visualized due to the patient's inability to suspend respiration and overlying gas. There is upper normal gallbladder [place] thickness with several non shadowing echogenic foci in the  gallbladder. Question non shadowing gallstones versus small foci of sludge.  MRCP would be the optimal study to further evaluate the common bile duct. If patient becomes able to suspend respiration for a significant period of time, consideration may wish to be given to MRCP for further assessment.   Electronically Signed   By: Lowella Grip III M.D.   On: 03/31/2015 02:25    CBC  Recent Labs Lab 03/28/15 1205  03/29/15 1717 03/30/15 0236 03/31/15 0400 04/01/15 0227 04/02/15 0553  WBC 22.8*  < > 15.1* 14.1* 17.7* 13.7* 10.5  HGB 11.3*  < > 10.6*  10.2* 9.9* 10.2* 9.8*  HCT 31.9*  < > 32.2* 29.7* 28.1* 28.4* 27.8*  PLT 152  < > 99* 93* 94* 86* 94*  MCV 90.4  < > 93.3 90.3 88.9 88.2 86.9  MCH 32.0  < > 30.7 31.0 31.3 31.7 30.6  MCHC 35.4  < > 32.9 34.3 35.2 35.9 35.3  RDW 13.7  < > 14.3 14.2 14.1 14.3 14.4  LYMPHSABS 0.2*  --   --   --  0.4*  --  PENDING  MONOABS 1.1*  --   --   --  0.9  --  PENDING  EOSABS 0.0  --   --   --  0.0  --  PENDING  BASOSABS 0.0  --   --   --  0.0  --  PENDING  < > = values in this interval not displayed.  Chemistries   Recent Labs Lab 03/30/15 0236 03/30/15 1127 03/30/15 2154 03/31/15 0400 03/31/15 1215 03/31/15 1845 04/01/15 0227 04/02/15 0553  NA 131*  --  132* 132*  --  130* 133* 132*  K 3.8  --  3.6 3.3*  --  4.7 3.4* 3.6  CL 103  --  105 105  --  103 107 106  CO2 15*  --  17* 17*  --  14* 18* 18*  GLUCOSE 38*  --  83 100*  --  103* 84 95  BUN 49*  --  50* 50*  --  50* 46* 52*  CREATININE 2.78*  --  3.34* 3.35*  --  3.46* 3.61* 3.72*  CALCIUM 7.4*  --  7.7* 7.8*  --  8.1* 8.4* 8.7*  MG 1.5*  --   --  2.1  --  2.1  --   --   AST 129*  135* 109*  --   --  58*  --  41 47*  ALT 234*  248* 221*  --   --  174*  --  135* 101*  ALKPHOS 110  109 132*  --   --  210*  --  210* 213*  BILITOT 6.2*  6.6* 6.5*  --   --  7.7*  --  8.2* 10.6*    ------------------------------------------------------------------------------------------------------------------ estimated creatinine clearance is 15.2 mL/min (by C-G formula based on Cr of 3.72). ------------------------------------------------------------------------------------------------------------------ No results for input(s): HGBA1C in the last 72 hours. ------------------------------------------------------------------------------------------------------------------ No results for input(s): CHOL, HDL, LDLCALC, TRIG, CHOLHDL, LDLDIRECT in the last 72 hours. ------------------------------------------------------------------------------------------------------------------ No results for input(s): TSH, T4TOTAL, T3FREE, THYROIDAB in the last 72 hours.  Invalid input(s): FREET3 ------------------------------------------------------------------------------------------------------------------ No results for input(s): VITAMINB12, FOLATE, FERRITIN, TIBC, IRON, RETICCTPCT in the last 72 hours.  Coagulation profile  Recent Labs Lab 03/28/15 1834 03/30/15 1127 04/02/15 0553  INR 2.59* 1.86* 1.37    No results for input(s): DDIMER in the last 72 hours.  Cardiac Enzymes  Recent Labs Lab 03/30/15 1127 03/30/15 1856 03/31/15 0400  TROPONINI 0.19* 0.14* 0.10*   ------------------------------------------------------------------------------------------------------------------ Invalid input(s): POCBNP   Recent Labs  03/31/15 0042 03/31/15 0345 03/31/15 0809 03/31/15 1202 03/31/15 1608 03/31/15 1946  GLUCAP 68 55 72 103* 112* 103*     Ethelyne Erich M.D. [facility] 04/02/2015, 11:03 AM  Pager: [phone] Between 7am to 7pm - call Pager - [phone]  After 7pm go to [email or web address] - password [email or web address]  Call night coverage person covering after 7pm

## 2015-04-03 DIAGNOSIS — R748 Abnormal levels of other serum enzymes: Secondary | ICD-10-CM | POA: Insufficient documentation

## 2015-04-03 DIAGNOSIS — A4150 Gram-negative sepsis, unspecified: Secondary | ICD-10-CM

## 2015-04-03 DIAGNOSIS — R1013 Epigastric pain: Secondary | ICD-10-CM | POA: Insufficient documentation

## 2015-04-03 DIAGNOSIS — R7881 Bacteremia: Secondary | ICD-10-CM

## 2015-04-03 LAB — HEPATIC FUNCTION PANEL
ALK PHOS: 256 U/L — AB (ref 38–126)
ALT: 89 U/L — AB (ref 17–63)
AST: 79 U/L — ABNORMAL HIGH (ref 15–41)
Albumin: 2.2 g/dL — ABNORMAL LOW (ref 3.5–5.0)
BILIRUBIN INDIRECT: 2.9 mg/dL — AB (ref 0.3–0.9)
BILIRUBIN TOTAL: 15.7 mg/dL — AB (ref 0.3–1.2)
Bilirubin, Direct: 12.8 mg/dL — ABNORMAL HIGH (ref 0.1–0.5)
TOTAL PROTEIN: 5.2 g/dL — AB (ref 6.5–8.1)

## 2015-04-03 LAB — BASIC METABOLIC PANEL
Anion gap: 12 (ref 5–15)
BUN: 58 mg/dL — ABNORMAL HIGH (ref 6–20)
CO2: 17 mmol/L — ABNORMAL LOW (ref 22–32)
CREATININE: 3.63 mg/dL — AB (ref 0.61–1.24)
Calcium: 8.6 mg/dL — ABNORMAL LOW (ref 8.9–10.3)
Chloride: 105 mmol/L (ref 101–111)
GFR calc non Af Amer: 14 mL/min — ABNORMAL LOW (ref >60)
GFR, EST AFRICAN AMERICAN: 16 mL/min — AB (ref >60)
GLUCOSE: 77 mg/dL (ref 65–99)
Potassium: 3.7 mmol/L (ref 3.5–5.1)
Sodium: 134 mmol/L — ABNORMAL LOW (ref 135–145)

## 2015-04-03 LAB — CBC WITH DIFFERENTIAL/PLATELET
BASOS ABS: 0 10*3/uL (ref 0.0–0.1)
BASOS PCT: 0 % (ref 0–1)
EOS ABS: 0.4 10*3/uL (ref 0.0–0.7)
EOS PCT: 3 % (ref 0–5)
HCT: 27.4 % — ABNORMAL LOW (ref 39.0–52.0)
Hemoglobin: 9.8 g/dL — ABNORMAL LOW (ref 13.0–17.0)
LYMPHS ABS: 0.6 10*3/uL — AB (ref 0.7–4.0)
Lymphocytes Relative: 5 % — ABNORMAL LOW (ref 12–46)
MCH: 31.5 pg (ref 26.0–34.0)
MCHC: 35.8 g/dL (ref 30.0–36.0)
MCV: 88.1 fL (ref 78.0–100.0)
Monocytes Absolute: 1.6 10*3/uL — ABNORMAL HIGH (ref 0.1–1.0)
Monocytes Relative: 13 % — ABNORMAL HIGH (ref 3–12)
NEUTROS ABS: 9.8 10*3/uL — AB (ref 1.7–7.7)
NEUTROS PCT: 79 % — AB (ref 43–77)
Platelets: 109 10*3/uL — ABNORMAL LOW (ref 150–400)
RBC: 3.11 MIL/uL — ABNORMAL LOW (ref 4.22–5.81)
RDW: 14.6 % (ref 11.5–15.5)
WBC: 12.4 10*3/uL — AB (ref 4.0–10.5)

## 2015-04-03 MED ORDER — ZOLPIDEM TARTRATE 5 MG PO TABS
5.0000 mg | ORAL_TABLET | Freq: Once | ORAL | Status: AC
  Administered 2015-04-04: 5 mg via ORAL
  Filled 2015-04-03: qty 1

## 2015-04-03 MED ORDER — DILTIAZEM HCL ER COATED BEADS 180 MG PO CP24
180.0000 mg | ORAL_CAPSULE | Freq: Every day | ORAL | Status: DC
  Administered 2015-04-03 – 2015-04-08 (×6): 180 mg via ORAL
  Filled 2015-04-03 (×6): qty 1

## 2015-04-03 NOTE — Care Management Important Message (Signed)
Important Message  Patient Details  Name: [name] MRN: [id number] Date of Birth: 1927-03-21   Medicare Important Message Given:  Yes-second notification given    Guido Sander, RN 04/03/2015, 2:10 PM

## 2015-04-03 NOTE — Care Management Note (Signed)
Case Management Note  Patient Details  Name: [name] MRN: [id number] Date of Birth: June 25, 1927  Subjective/Objective:                  Chief Complaint: ARF HPI: 81yo WM admitted 03/28/15 with fevers and hypotension found to have E coli sepsis possibly sec to cholecystitis.  Action/Plan: CM spoke to patient and son at the bedside. Pt states that he lives at home alone and that his brother lives nearby. Member states that prior to admission he was independent but now is weakened because of his condition. CM asked about 24 hour supervision and pt/son state that they are not sure that would be possible.  Depending on how the pt progresses with therapy, pt may benefit from SNF but he states that he does not want to go back to [facility] where he has been before. Pt and family prefer to await further therapy to see how pt progresses. Son states that the pt only has one step to navigate at home, otherwise barrier free home per son. Son's name is [place] Ro[place]. Contact number [phone] or [phone]. CM spoke about choice HH agency and Pt states that if he does need [place] or DME that he would prefer to use [facility]. Discharge disposition pending additional PT/OT sessions.   Expected Discharge Date:                  Expected Discharge Plan:  [place]   In-House Referral:     Discharge planning Services  CM Consult  Post Acute Care Choice:  Durable Medical Equipment, Home Health Choice offered to:  Patient, Adult Children  DME Arranged:    DME Agency:     HH Arranged:    [place] Agency:     Status of Service:  In process, will continue to follow  Medicare Important Message Given:  Yes-second notification given Date Medicare IM Given:    Medicare IM give by:    Date Additional Medicare IM Given:    Additional Medicare Important Message give by:     If discussed at [place] of Stay Meetings, dates discussed:    Additional Comments:  Guido Sander,  RN 04/03/2015, 3:33 PM

## 2015-04-03 NOTE — Progress Notes (Signed)
[facility]                                                                              Patient Demographics  [name], is a 81 y.o. male, DOB - 05/22/1927, SLH:[id number]  Admit date - 03/28/2015   Admitting Physician Donne Hazel, MD  Outpatient Primary MD for the patient is [name], MD  LOS - 6   Chief Complaint  Patient presents with  . Hypoglycemia  . 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 He had been seen at an urgent care the day prior for epigastric pain.   On EMS arrival to the home, the patient was found to be hypotensive and hypoglycemic which responded to IVF's and dextrose. The patient reported of approximately 48 hours of epigastric pain, malaise and fevers. Initial work up showed temp 101, WBC 22.8, tachycardia, hypotension, AST/ALT 454/521 / bilirubin 6.1, sr cr 2.32, Na 129 and a lactic acid of 4.56. Initial CXR showed a large hiatal hernia but no edema or consolidation. An ultrasound of the abdomen was obtained and revealed an abnormal appearance of the gallbladder with contraction, Buckle thickening and nodularity without discrete. Follow up CT of the abdomen was assessed and showed an large, non-obstructing hiatal hernia with intrathoracic stomach dilatation, colon diverticulosis, peri-nephric stranding and free fluid in the pelvis. The patient was admitted per [facility]. He was also evaluated by GI and CCS who both feel findings are consistent with cholangitis and sepsis. The patient was volume resuscitated, treated with abx (aztreonam/vanco in ER, then to levaquin / flagyl), pan cultured and monitored on SDU. Unfortunately, the afternoon of 8/9, he deteriorated and required emergent  intubation with transfer to ICU.  STUDIES:/EVENTS 8/08 Admitted with abdominal pain, hypotension. 8/08 [place] ABD >> abnormal appearance of the gallbladder with contraction, Moisan thickening and nodularity, no discrete stones, no other acute abnormality. CBD 8.5 mm 8/08 CT ABD/Pelvis >> gaseous distention of the stomach, large hiatal hernia, small high attenuation structure in the distal common bile duct (? Choledocholithiasis or small periampullary duodenum diverticulum), non-specific perinephric stranding, small volume free fluid within the pelvis  03/29/15: intubated. Moved to ICU 03/30/15: Not on pressors. Extubated. Repeated transient asymptomatic hypoglycemia per RN. He has denued abd pain to CCS and GI. CCS suspecting CBD stone with obstruction based on LFT but d/w DR Carlean Purl of GI - he reviewed CT with radiology - CBD stone/dilatation presence not convincing. GI Blood culture growing Gram Negativ Coccobacilli   Assessment & Plan    Principal Problem:  Severe Sepsis with Escherichia coli bacteremia: Unclear etiology possibly gallbladder versus bowel translocation - Continue IV ciprofloxacin currently stable, not spiking any fevers  Active Problems:   atrial fibrillation with RVR: Currently normal sinus rhythm - Patient back in normal sinus rhythm, appreciate cardiology recommendations, transitioned to oral Cardizem today. - Recommendations DC  amlodipine outpatient, currently metoprolol on hold. - Continue to hold eliquis in case GI procedures are planned, cannot be on anticoagulation due to thrombocytopenia - Patient had an echo on 8/10, showed EF of 38-45%, grade 2 diastolic dysfunction    Acute hypercarbia respiratory failure - Extubated on 03/30/15, continue as needed albuterol  Acute on chronic kidney disease- Likely worsened due to ATN, new paraphimosis - Nephrology following, cmet in am  Transaminitis with transient biliary obstruction, cholecystitis, Escherichia coli  bacteremia - Currently tolerating diet, general surgery, gastroenterology following - Per GI, can do MRCP without contrast or ERCP, once bilirubin starts trending down  Anemia, thrombocytopenia -Continue to hold anticoagulation, monitor CBC closely    Code Status:Full code   Family Communication: Discussed in detail with the patient, all imaging results, lab results explained to the patient    Disposition Plan:Not medically ready   Time Spent in minutes   25 minutes  Procedures   Mechanical ventilation Ultrasound abdomen CT abdomen  Consults   Cardiology  General surgery  GI  CCM  DVT Prophylaxis  SCD's  Medications  Scheduled Meds: . ciprofloxacin  400 mg Intravenous Q24H  . diltiazem  180 mg Oral Daily  . feeding supplement (ENSURE ENLIVE)  237 mL Oral BID BM  . levothyroxine  25 mcg Oral QAC breakfast  . pantoprazole  40 mg Oral Q0600  . pramipexole  1 mg Oral QHS  . sodium chloride  3 mL Intravenous Q12H   Continuous Infusions: . dextrose 5 % and 0.45% NaCl 10 mL/hr at 04/01/15 1100   PRN Meds:.albuterol, metoprolol   Antibiotics   Anti-infectives    Start     Dose/Rate Route Frequency Ordered Stop   03/31/15 1000  ciprofloxacin (CIPRO) IVPB 400 mg     400 mg 200 mL/hr over 60 Minutes Intravenous Every 24 hours 03/31/15 0933     03/30/15 1400  vancomycin (VANCOCIN) IVPB 1000 mg/200 mL premix  Status:  Discontinued     1,000 mg 200 mL/hr over 60 Minutes Intravenous Every 48 hours 03/30/15 0751 03/31/15 0933   03/30/15 1300  levofloxacin (LEVAQUIN) IVPB 500 mg  Status:  Discontinued     500 mg 100 mL/hr over 60 Minutes Intravenous Every 48 hours 03/28/15 1407 03/28/15 1645   03/30/15 1230  levofloxacin (LEVAQUIN) IVPB 500 mg  Status:  Discontinued     500 mg 100 mL/hr over 60 Minutes Intravenous Every 48 hours 03/28/15 1707 03/29/15 1619   03/29/15 1500  vancomycin (VANCOCIN) IVPB 750 mg/150 ml premix  Status:  Discontinued     750 mg 150 mL/hr over  60 Minutes Intravenous Every 24 hours 03/28/15 1407 03/28/15 1645   03/29/15 1400  vancomycin (VANCOCIN) IVPB 750 mg/150 ml premix  Status:  Discontinued     750 mg 150 mL/hr over 60 Minutes Intravenous Every 24 hours 03/28/15 1713 03/30/15 0750   03/29/15 1230  levofloxacin (LEVAQUIN) IVPB 750 mg  Status:  Discontinued     750 mg 100 mL/hr over 90 Minutes Intravenous  Once 03/28/15 1656 03/28/15 1707   03/28/15 2100  aztreonam (AZACTAM) 1 g in dextrose 5 % 50 mL IVPB  Status:  Discontinued     1 g 100 mL/hr over 30 Minutes Intravenous Every 8 hours 03/28/15 1407 03/28/15 1645   03/28/15 2100  aztreonam (AZACTAM) 1 g in dextrose 5 % 50 mL IVPB  Status:  Discontinued     1 g 100 mL/hr over 30 Minutes Intravenous 3 times per day  03/28/15 1713 03/31/15 0933   03/28/15 1700  levofloxacin (LEVAQUIN) IVPB 750 mg  Status:  Discontinued     750 mg 100 mL/hr over 90 Minutes Intravenous  Once 03/28/15 1645 03/28/15 1656   03/28/15 1700  metroNIDAZOLE (FLAGYL) IVPB 500 mg  Status:  Discontinued     500 mg 100 mL/hr over 60 Minutes Intravenous  Once 03/28/15 1645 03/28/15 1658   03/28/15 1700  metroNIDAZOLE (FLAGYL) IVPB 500 mg  Status:  Discontinued     500 mg 100 mL/hr over 60 Minutes Intravenous Every 8 hours 03/28/15 1658 03/31/15 0940   03/28/15 1245  vancomycin (VANCOCIN) 1,500 mg in sodium chloride 0.9 % 500 mL IVPB     1,500 mg 250 mL/hr over 120 Minutes Intravenous STAT 03/28/15 1235 03/28/15 1649   03/28/15 1230  levofloxacin (LEVAQUIN) IVPB 750 mg     750 mg 100 mL/hr over 90 Minutes Intravenous  Once 03/28/15 1228 03/28/15 1440   03/28/15 1230  aztreonam (AZACTAM) 2 g in dextrose 5 % 50 mL IVPB     2 g 100 mL/hr over 30 Minutes Intravenous  Once 03/28/15 1228 03/28/15 1354   03/28/15 1230  vancomycin (VANCOCIN) IVPB 1000 mg/200 mL premix  Status:  Discontinued     1,000 mg 200 mL/hr over 60 Minutes Intravenous  Once 03/28/15 1228 03/28/15 1234        Subjective:   Corry Ihnen  was seen and examined today. Back in normal sinus rhythm, no complaints this morning except shortness of breath.  Patient denies dizziness, chest pain, abdominal pain, N/V/D/C, new weakness, numbess, tingling  Objective:   Blood pressure 135/70, pulse 87, temperature 97.7 F (36.5 C), temperature source Oral, resp. rate 18, height 5\' 11"  (1.803 m), weight 80.151 kg (176 lb 11.2 oz), SpO2 96 %.  Wt Readings from Last 3 Encounters:  04/03/15 80.151 kg (176 lb 11.2 oz)  02/28/15 72.213 kg (159 lb 3.2 oz)  01/26/15 73.936 kg (163 lb)     Intake/Output Summary (Last 24 hours) at 04/03/15 1114 Last data filed at 04/03/15 1000  Gross per 24 hour  Intake 720.08 ml  Output   1450 ml  Net -729.92 ml    Exam  General: Alert and oriented x 3, NAD  HEENT:  PERRLA, EOMI,   Neck: Supple, no JVD,  CVS: S1 and S2 clear, RRR  Respiratory: Decreased breath sounds at the bases  Abdomen: Soft, nontender, nondistended, + bowel sounds  Ext: no cyanosis clubbing or edema  Neuro: no new deficits  Skin: No rashes  Psych: Normal affect and demeanor, alert and oriented x3    Data Review   Micro Results Recent Results (from the past 240 hour(s))  Blood Culture (routine x 2)     Status: None   Collection Time: 03/28/15 12:40 PM  Result Value Ref Range Status   Specimen Description BLOOD RIGHT ANTECUBITAL  Final   Special Requests BOTTLES DRAWN AEROBIC AND ANAEROBIC 5CC  Final   Culture  Setup Time   Final    GRAM NEGATIVE COCCOBACILLI IN BOTH AEROBIC AND ANAEROBIC BOTTLES CRITICAL RESULT CALLED TO, READ BACK BY AND VERIFIED WITH: C GOSS RN 2300 03/28/15 A BROWNING    Culture ESCHERICHIA COLI  Final   Report Status 03/30/2015 FINAL  Final   Organism ID, Bacteria ESCHERICHIA COLI  Final      Susceptibility   Escherichia coli - MIC*    AMPICILLIN >=32 RESISTANT Resistant     CEFAZOLIN 8 SENSITIVE Sensitive  CEFEPIME <=1 SENSITIVE Sensitive     CEFTAZIDIME <=1 SENSITIVE Sensitive       CEFTRIAXONE <=1 SENSITIVE Sensitive     CIPROFLOXACIN <=0.25 SENSITIVE Sensitive     GENTAMICIN <=1 SENSITIVE Sensitive     IMIPENEM <=0.25 SENSITIVE Sensitive     TRIMETH/SULFA <=20 SENSITIVE Sensitive     AMPICILLIN/SULBACTAM >=32 RESISTANT Resistant     PIP/TAZO 64 INTERMEDIATE Intermediate     * ESCHERICHIA COLI  Blood Culture (routine x 2)     Status: None   Collection Time: 03/28/15 12:54 PM  Result Value Ref Range Status   Specimen Description BLOOD RIGHT HAND  Final   Special Requests BOTTLES DRAWN AEROBIC AND ANAEROBIC 3CC  Final   Culture  Setup Time   Final    GRAM NEGATIVE RODS IN BOTH AEROBIC AND ANAEROBIC BOTTLES CRITICAL RESULT CALLED TO, READ BACK BY AND VERIFIED WITH: B LEAP,RN [id number] [place]    Culture   Final    ESCHERICHIA COLI SUSCEPTIBILITIES PERFORMED ON PREVIOUS CULTURE WITHIN THE LAST 5 DAYS.    Report Status 03/30/2015 FINAL  Final  Urine culture     Status: None   Collection Time: 03/28/15  1:00 PM  Result Value Ref Range Status   Specimen Description URINE, CATHETERIZED  Final   Special Requests NONE  Final   Culture NO GROWTH 2 DAYS  Final   Report Status 03/30/2015 FINAL  Final  MRSA PCR Screening     Status: None   Collection Time: 03/29/15  3:18 AM  Result Value Ref Range Status   MRSA by PCR NEGATIVE NEGATIVE Final    Comment:        The GeneXpert MRSA Assay (FDA approved for NASAL specimens only), is one component of a comprehensive MRSA colonization surveillance program. It is not intended to diagnose MRSA infection nor to guide or monitor treatment for MRSA infections.   Culture, respiratory (NON-Expectorated)     Status: None   Collection Time: 03/29/15  4:27 PM  Result Value Ref Range Status   Specimen Description TRACHEAL ASPIRATE  Final   Special Requests NONE  Final   Gram Stain   Final    FEW WBC PRESENT,BOTH PMN AND MONONUCLEAR RARE SQUAMOUS EPITHELIAL CELLS PRESENT RARE GRAM POSITIVE COCCI IN PAIRS Performed  at [facility]    Culture   Final    Non-Pathogenic Oropharyngeal-type Flora Isolated. Performed at [facility]    Report Status 03/31/2015 FINAL  Final    Radiology Reports Ct Abdomen Pelvis Wo Contrast  03/28/2015   CLINICAL DATA:  81 year old male with generalized weakness, low blood sugar and epigastric pain  EXAM: CT ABDOMEN AND PELVIS WITHOUT CONTRAST  TECHNIQUE: Multidetector CT imaging of the abdomen and pelvis was performed following the standard protocol without IV contrast.  COMPARISON:  Abdominal ultrasound performed earlier today  FINDINGS: Lower Chest: Massive hiatal hernia. Ingested oral contrast material lays within the partially intra thoracic stomach which is significantly dilated with gas. The heart is within normal limits for size. Atherosclerotic calcifications present throughout the visualized coronary arteries. No pericardial effusion. Chronic atelectasis in the bilateral lower lungs worse on the left than the right around the large hiatal hernia. Otherwise, the visualized lower lungs are essentially clear. Focal subpleural steatosis adjacent to the right middle lobe.  Abdomen: Unenhanced CT was performed per clinician order. Lack of IV contrast limits sensitivity and specificity, especially for evaluation of abdominal/pelvic solid viscera. Mild gaseous distension of the intra-abdominal portion of  the stomach. Unremarkable CT appearance of the spleen, adrenal glands and pancreas. The gallbladder is contracted. There is a mild biliary ductal dilatation. A 6 x 8 mm high attenuation structure either within or immediately adjacent to the distal common bile duct. It is unclear if this represents a small periampullary duodenum diverticulum or choledocholithiasis.  The liver is otherwise normal in size and contour. No definite liver lesion given the limitations of absent IV contrast. Nonspecific perinephric stranding bilaterally. No hydronephrosis. No nephrolithiasis. No  free fluid or suspicious adenopathy. No evidence of a bowel obstruction or focal bowel Campton thickening. Moderate volume of formed stool suggests constipation. Colonic diverticular disease without CT evidence of active inflammation.  Pelvis: The bladder is decompressed. Surgical changes of prior prostatectomy. Trace free fluid layers dependently within the pelvis.  Bones/Soft Tissues: No acute fracture or aggressive appearing lytic or blastic osseous lesion. Advanced multilevel degenerative disc disease. Small left fat containing inguinal hernia. Dextro convex scoliosis of the lumbar spine.  Vascular: Scattered atherosclerotic vascular calcifications without evidence of aneurysm. Otherwise, evaluation significantly limited in the absence of intravenous contrast.  IMPRESSION: 1. The patient's mid epigastric pain may be related to gaseous distension of the stomach including the large hiatal hernia and intra thoracic portion of the stomach. As the stomach is incompletely imaged, a component of acute or chronic gastric volvulus is difficult to exclude entirely although is clear there is not complete obstruction given passage of ingested oral contrast material into the distal bowel. 2. Small high attenuation structure in the region of the distal common bile duct may represent choledocholithiasis or a small periampullary duodenum diverticulum. A small duodenum diverticulum is favored. Recommend clinical correlation for evidence of a biliary obstruction or right upper quadrant pain. If clinically warranted, MRCP could further evaluate. 3. Nonspecific perinephric stranding bilaterally. 4. Colonic diverticular disease without CT evidence of active inflammation. 5. Small volume free fluid layers within the pelvis. This is abnormal in a male patient has and is likely secondary to an underlying infectious or inflammatory process. 6. Advanced multilevel degenerative disc disease with dextro convex scoliosis. 7. Scattered  atherosclerotic vascular calcifications.   Electronically Signed   By: Jacqulynn Cadet M.D.   On: 03/28/2015 20:15   US Abdomen Complete  03/28/2015   CLINICAL DATA:  Elevated liver function studies, elevated lactate level, epigastric pain  EXAM: ULTRASOUND ABDOMEN COMPLETE  COMPARISON:  Abdominal ultrasound of December 13, 2011  FINDINGS: Gallbladder: The gallbladder is contracted and its St[place]y is irregular. Stones have been described in the past but discrete stones are not observed today.  Common bile duct: Diameter: 8.5 mm  Liver: The liver exhibits no focal mass nor ductal dilation. The surface contour is normal.  IVC: No abnormality visualized.  Pancreas: The pancreas was obscured by bowel gas.  Spleen: Size and appearance within normal limits.  Right Kidney: Length: 10.5 cm. Echogenicity within normal limits. No mass or hydronephrosis visualized.  Left Kidney: Length: 10.5 cm. Echogenicity within normal limits. No mass or hydronephrosis visualized.  Abdominal aorta: No aneurysm visualized.  Other findings: No ascites is demonstrated.  IMPRESSION: 1. Abnormal appearance of the gallbladder with contraction an Brisky thickening and nodularity. Stones may be present but are not discretely demonstrated. 2. No acute abnormality of the liver, spleen, or kidneys. The pancreas was obscured by bowel gas. 3. Given the patient's laboratory findings and chief complaint, further evaluation with abdominal and pelvic CT scanning would be useful.   Electronically Signed   By: Shanon Brow  [place] M.D.   On: 03/28/2015 15:53   Dg Chest Port 1 View  03/30/2015   CLINICAL DATA:  Acute respiratory failure.  EXAM: PORTABLE CHEST - 1 VIEW  COMPARISON:  03/29/2015.  FINDINGS: 1116 hours. Endotracheal tube tip is 3.5 cm above the carina. Nasogastric tube projects below the diaphragm, tip not visualized. There is interval improved aeration of the lung bases with mild residual bibasilar atelectasis. No confluent airspace opacity, edema or  significant pleural effusion. Cardiomegaly and a large hiatal hernia are again noted post CABG.  IMPRESSION: Interval improved aeration of the lung bases. Stable support system.   Electronically Signed   By: Richardean Sale M.D.   On: 03/30/2015 11:44   Dg Chest Port 1 View  03/29/2015   CLINICAL DATA:  Evaluate endotracheal tube position.  EXAM: PORTABLE CHEST - 1 VIEW  COMPARISON:  03/29/2015.  FINDINGS: Support apparatus: Endotracheal tube 3 cm from the carina. Enteric tube is present with the tip not visible. Side port is in the proximal gastric fundus. Monitoring leads project over the chest.  Cardiomediastinal Silhouette:  Unchanged.  Lungs: Bilateral basilar atelectasis, greater on the LEFT when compared to the RIGHT. No pneumothorax.  Effusions: RIGHT-greater-than-LEFT pleural effusions, likely partially loculated on the RIGHT.  Other:  Large hiatal hernia with intrathoracic gastric air bubble.  IMPRESSION: 1. Endotracheal tube tip 3 cm from the carina. 2. Nasogastric tube in the stomach. 3. Cardiomegaly with bilateral pleural effusions. 4. Large hiatal hernia.   Electronically Signed   By: Dereck Ligas M.D.   On: 03/29/2015 18:40   Dg Chest Port 1 View  03/29/2015   CLINICAL DATA:  Intubation  EXAM: PORTABLE CHEST - 1 VIEW  COMPARISON:  03/28/2015  FINDINGS: Endotracheal tube just above the carina. Recommend withdrawal of 3 cm.  Hypoventilation. Elevated left hemidiaphragm with left lower lobe atelectasis unchanged. Mild right lower lobe atelectasis and small bilateral effusions.  Improved aeration of the lungs compared with earlier today  IMPRESSION: Endotracheal tube at the carina, recommend withdrawal 3 cm.  Improved aeration in the lung bases. Persistent bibasilar atelectasis has improved. Small bilateral pleural effusions.   Electronically Signed   By: Franchot Gallo M.D.   On: 03/29/2015 14:59   Dg Chest Port 1 View  03/28/2015   CLINICAL DATA:  Shortness of Breath  EXAM: PORTABLE CHEST - 1  VIEW  COMPARISON:  Study obtained earlier in the day  FINDINGS: Large hiatal type hernia is again identified with most of the stomach above the diaphragm. The degree of inspiration shallow. There is no frank edema or consolidation. No appreciable joint effusions. Heart is mildly prominent with pulmonary vascularity within normal limits. Patient is status post coronary artery bypass grafting. No adenopathy.  IMPRESSION: Large hiatal type hernia. No edema or consolidation. Degree of inspiration shallow. No new opacity. No change in cardiac silhouette.   Electronically Signed   By: Lowella Grip III M.D.   On: 03/28/2015 18:36   Dg Chest Portable 1 View  03/28/2015   CLINICAL DATA:  Shortness of breath today  EXAM: PORTABLE CHEST - 1 VIEW  COMPARISON:  03/27/2015  FINDINGS: Cardiac shadow is stable. A large hiatal hernia is again identified. Postsurgical changes are again seen. No focal infiltrate or sizable effusion is noted.  IMPRESSION: Stable large hiatal hernia.  No acute abnormality noted.   Electronically Signed   By: Inez Catalina M.D.   On: 03/28/2015 11:45   Dg Abd Portable 1v  03/30/2015   CLINICAL  DATA:  Abnormal LFT  EXAM: PORTABLE ABDOMEN - 1 VIEW  COMPARISON:  03/29/2015  FINDINGS: NG tube in the stomach. Normal bowel gas pattern. Contrast in the rectum from prior CT. Surgical clips in the pelvis.  Lumbar scoliosis and degenerative change.  No renal calculi.  IMPRESSION: Normal bowel gas pattern.  NG tube in the stomach.   Electronically Signed   By: Franchot Gallo M.D.   On: 03/30/2015 11:46   Dg Abd Portable 1v  03/29/2015   CLINICAL DATA:  Check NG tube position  EXAM: PORTABLE ABDOMEN - 1 VIEW  COMPARISON:  03/29/2015 at 1527 hours  FINDINGS: Enteric tube terminates below the diaphragm in the gastric body.  Nonobstructive bowel gas pattern.  Nodule contrast in the distal colon.  IMPRESSION: Enteric tube terminates below the diaphragm in the gastric body.   Electronically Signed   By:  Julian Hy M.D.   On: 03/29/2015 16:55   Dg Abd Portable 1v  03/29/2015   CLINICAL DATA:  NG tube placement.  EXAM: PORTABLE ABDOMEN - 1 VIEW  COMPARISON:  CT scan 03/28/2015.  FINDINGS: The NG tube tip is in the lower aspect of the chest. This is likely in the distal esophagus or possibly the large hiatal hernia. The proximal port is in the mid esophagus.  The stomach demonstrates gaseous distention. There is contrast in the bowel.  IMPRESSION: The NG tube tip is in the distal esophagus or large hiatal hernia.   Electronically Signed   By: Marijo Sanes M.D.   On: 03/29/2015 15:55   US Abdomen Limited Ruq  03/31/2015   CLINICAL DATA:  Questionable choledocholithiasis on recent CT  EXAM: US ABDOMEN LIMITED - RIGHT UPPER QUADRANT  COMPARISON:  Abdominal ultrasound and CT abdomen and pelvis March 28, 2015  FINDINGS: Gallbladder:  The gallbladder is somewhat contracted. There are nonshadowing echogenic foci in the gallbladder similar to 1 day prior. The gallbladder Galla is upper normal in thickness. There is no pericholecystic fluid. No sonographic Murphy sign noted.  Common bile duct:  Diameter: 6 mm. There is no mass or calculus seen in the visualized portions of the common bile duct. Portions of the common bile duct are obscured by gas.  Liver:  No focal lesion identified. Within normal limits in parenchymal echogenicity.  IMPRESSION: Note that the study is less than optimal due to patient's inability to suspend respiration significantly. No choledocholithiasis is seen by ultrasound. Portions of the common bile duct, however, are not well visualized due to the patient's inability to suspend respiration and overlying gas. There is upper normal gallbladder Faxon thickness with several non shadowing echogenic foci in the gallbladder. Question non shadowing gallstones versus small foci of sludge.  MRCP would be the optimal study to further evaluate the common bile duct. If patient becomes able to suspend  respiration for a significant period of time, consideration may wish to be given to MRCP for further assessment.   Electronically Signed   By: Lowella Grip III M.D.   On: 03/31/2015 02:25    CBC  Recent Labs Lab 03/28/15 1205  03/30/15 0236 03/31/15 0400 04/01/15 0227 04/02/15 0553 04/03/15 0509  WBC 22.8*  < > 14.1* 17.7* 13.7* 10.5 12.4*  HGB 11.3*  < > 10.2* 9.9* 10.2* 9.8* 9.8*  HCT 31.9*  < > 29.7* 28.1* 28.4* 27.8* 27.4*  PLT 152  < > 93* 94* 86* 94* 109*  MCV 90.4  < > 90.3 88.9 88.2 86.9 88.1  MCH 32.0  < >  31.0 31.3 31.7 30.6 31.5  MCHC 35.4  < > 34.3 35.2 35.9 35.3 35.8  RDW 13.7  < > 14.2 14.1 14.3 14.4 14.6  LYMPHSABS 0.2*  --   --  0.4*  --  0.9 0.6*  MONOABS 1.1*  --   --  0.9  --  0.9 1.6*  EOSABS 0.0  --   --  0.0  --  0.2 0.4  BASOSABS 0.0  --   --  0.0  --  0.0 0.0  < > = values in this interval not displayed.  Chemistries   Recent Labs Lab 03/30/15 0236 03/30/15 1127  03/31/15 0400 03/31/15 1215 03/31/15 1845 04/01/15 0227 04/02/15 0553 04/02/15 1530  NA 131*  --   < > 132*  --  130* 133* 132* 131*  K 3.8  --   < > 3.3*  --  4.7 3.4* 3.6 3.5  CL 103  --   < > 105  --  103 107 106 102  CO2 15*  --   < > 17*  --  14* 18* 18* 20*  GLUCOSE 38*  --   < > 100*  --  103* 84 95 122*  BUN 49*  --   < > 50*  --  50* 46* 52* 54*  CREATININE 2.78*  --   < > 3.35*  --  3.46* 3.61* 3.72* 3.68*  CALCIUM 7.4*  --   < > 7.8*  --  8.1* 8.4* 8.7* 8.9  MG 1.5*  --   --  2.1  --  2.1  --   --   --   AST 129*  135* 109*  --   --  58*  --  41 47*  --   ALT 234*  248* 221*  --   --  174*  --  135* 101*  --   ALKPHOS 110  109 132*  --   --  210*  --  210* 213*  --   BILITOT 6.2*  6.6* 6.5*  --   --  7.7*  --  8.2* 10.6*  --   < > = values in this interval not displayed. ------------------------------------------------------------------------------------------------------------------ estimated creatinine clearance is 15.3 mL/min (by C-G formula based on Cr of  3.68). ------------------------------------------------------------------------------------------------------------------ No results for input(s): HGBA1C in the last 72 hours. ------------------------------------------------------------------------------------------------------------------ No results for input(s): CHOL, HDL, LDLCALC, TRIG, CHOLHDL, LDLDIRECT in the last 72 hours. ------------------------------------------------------------------------------------------------------------------ No results for input(s): TSH, T4TOTAL, T3FREE, THYROIDAB in the last 72 hours.  Invalid input(s): FREET3 ------------------------------------------------------------------------------------------------------------------ No results for input(s): VITAMINB12, FOLATE, FERRITIN, TIBC, IRON, RETICCTPCT in the last 72 hours.  Coagulation profile  Recent Labs Lab 03/28/15 1834 03/30/15 1127 04/02/15 0553  INR 2.59* 1.86* 1.37    No results for input(s): DDIMER in the last 72 hours.  Cardiac Enzymes  Recent Labs Lab 04/02/15 0910 04/02/15 1530 04/02/15 2035  TROPONINI 0.58* 0.07* 0.06*   ------------------------------------------------------------------------------------------------------------------ Invalid input(s): POCBNP   Recent Labs  03/31/15 1202 03/31/15 1608 03/31/15 1946  GLUCAP 103* 112* 103*     Ryliee Figge M.D. [facility] 04/03/2015, 11:14 AM  Pager: [phone] Between 7am to 7pm - call Pager - [phone]  After 7pm go to [email or web address] - password [email or web address]  Call night coverage person covering after 7pm

## 2015-04-03 NOTE — Progress Notes (Signed)
[facility] Progress Note    Subjective: Patient seems to be SOB at rest.  On the telephone.  Still jaundiced.  Objective: Vital signs in last 24 hours: Temp:  [97.6 F (36.4 C)-97.9 F (36.6 C)] 97.7 F (36.5 C) (08/14 0725) Pulse Rate:  [60-159] 87 (08/14 0800) Resp:  [10-21] 18 (08/14 0800) BP: (87-142)/(57-73) 135/70 mmHg (08/14 0725) SpO2:  [96 %-99 %] 96 % (08/14 0800) Weight:  [80.151 kg (176 lb 11.2 oz)] 80.151 kg (176 lb 11.2 oz) (08/14 0538) Last BM Date: 04/03/15  Intake/Output from previous day: 08/13 0701 - 08/14 0700 In: 490.1 [P.O.:390; I.V.:100.1] Out: 1451 [Urine:1450; Stool:1] Intake/Output this shift: Total I/O In: 490 [P.O.:480; I.V.:10] Out: -   General: No acuted distress  Lungs: Wheezing bilaterally, may be upper respiratory  Abd: Soft, non-tender, good bowel sounds  Extremities: No changed  Neuro: Intact  Lab Results:  @LABLAST2 (wbc:2,hgb:2,hct:2,plt:2) BMET ) Recent Labs  04/02/15 0553 04/02/15 1530  NA 132* 131*  K 3.6 3.5  CL 106 102  CO2 18* 20*  GLUCOSE 95 122*  BUN 52* 54*  CREATININE 3.72* 3.68*  CALCIUM 8.7* 8.9   PT/INR  Recent Labs  04/02/15 0553  LABPROT 17.0*  INR 1.37   ABG  Recent Labs  04/01/15 1104  PHART 7.342*  HCO3 17.1*    Studies/Results: No results found.  Anti-infectives: Anti-infectives    Start     Dose/Rate Route Frequency Ordered Stop   03/31/15 1000  ciprofloxacin (CIPRO) IVPB 400 mg     400 mg 200 mL/hr over 60 Minutes Intravenous Every 24 hours 03/31/15 0933     03/30/15 1400  vancomycin (VANCOCIN) IVPB 1000 mg/200 mL premix  Status:  Discontinued     1,000 mg 200 mL/hr over 60 Minutes Intravenous Every 48 hours 03/30/15 0751 03/31/15 0933   03/30/15 1300  levofloxacin (LEVAQUIN) IVPB 500 mg  Status:  Discontinued     500 mg 100 mL/hr over 60 Minutes Intravenous Every 48 hours 03/28/15 1407 03/28/15 1645   03/30/15 1230  levofloxacin (LEVAQUIN) IVPB 500 mg  Status:  Discontinued      500 mg 100 mL/hr over 60 Minutes Intravenous Every 48 hours 03/28/15 1707 03/29/15 1619   03/29/15 1500  vancomycin (VANCOCIN) IVPB 750 mg/150 ml premix  Status:  Discontinued     750 mg 150 mL/hr over 60 Minutes Intravenous Every 24 hours 03/28/15 1407 03/28/15 1645   03/29/15 1400  vancomycin (VANCOCIN) IVPB 750 mg/150 ml premix  Status:  Discontinued     750 mg 150 mL/hr over 60 Minutes Intravenous Every 24 hours 03/28/15 1713 03/30/15 0750   03/29/15 1230  levofloxacin (LEVAQUIN) IVPB 750 mg  Status:  Discontinued     750 mg 100 mL/hr over 90 Minutes Intravenous  Once 03/28/15 1656 03/28/15 1707   03/28/15 2100  aztreonam (AZACTAM) 1 g in dextrose 5 % 50 mL IVPB  Status:  Discontinued     1 g 100 mL/hr over 30 Minutes Intravenous Every 8 hours 03/28/15 1407 03/28/15 1645   03/28/15 2100  aztreonam (AZACTAM) 1 g in dextrose 5 % 50 mL IVPB  Status:  Discontinued     1 g 100 mL/hr over 30 Minutes Intravenous 3 times per day 03/28/15 1713 03/31/15 0933   03/28/15 1700  levofloxacin (LEVAQUIN) IVPB 750 mg  Status:  Discontinued     750 mg 100 mL/hr over 90 Minutes Intravenous  Once 03/28/15 1645 03/28/15 1656   03/28/15 1700  metroNIDAZOLE (FLAGYL) IVPB  500 mg  Status:  Discontinued     500 mg 100 mL/hr over 60 Minutes Intravenous  Once 03/28/15 1645 03/28/15 1658   03/28/15 1700  metroNIDAZOLE (FLAGYL) IVPB 500 mg  Status:  Discontinued     500 mg 100 mL/hr over 60 Minutes Intravenous Every 8 hours 03/28/15 1658 03/31/15 0940   03/28/15 1245  vancomycin (VANCOCIN) 1,500 mg in sodium chloride 0.9 % 500 mL IVPB     1,500 mg 250 mL/hr over 120 Minutes Intravenous STAT 03/28/15 1235 03/28/15 1649   03/28/15 1230  levofloxacin (LEVAQUIN) IVPB 750 mg     750 mg 100 mL/hr over 90 Minutes Intravenous  Once 03/28/15 1228 03/28/15 1440   03/28/15 1230  aztreonam (AZACTAM) 2 g in dextrose 5 % 50 mL IVPB     2 g 100 mL/hr over 30 Minutes Intravenous  Once 03/28/15 1228 03/28/15 1354    03/28/15 1230  vancomycin (VANCOCIN) IVPB 1000 mg/200 mL premix  Status:  Discontinued     1,000 mg 200 mL/hr over 60 Minutes Intravenous  Once 03/28/15 1228 03/28/15 1234      Assessment/Plan: s/p  Patient with likely dilated CBD on MRCP.  Needs ERCP to help diagnose the problem with drainage.  LOS: 6 days   Kathryne Eriksson. Dahlia Bailiff, MD, FACS [phone] [facility] 04/03/2015

## 2015-04-03 NOTE — Progress Notes (Addendum)
Subjective: No acute events.  Feeling better this AM.  Objective: Vital signs in last 24 hours: Temp:  [97.6 F (36.4 C)-97.9 F (36.6 C)] 97.7 F (36.5 C) (08/14 0538) Pulse Rate:  [60-165] 78 (08/14 0538) Resp:  [10-34] 16 (08/14 0538) BP: (79-142)/(32-96) 128/63 mmHg (08/14 0538) SpO2:  [93 %-99 %] 98 % (08/14 0538) Weight:  [80.151 kg (176 lb 11.2 oz)] 80.151 kg (176 lb 11.2 oz) (08/14 0538) Last BM Date: 03/31/15  Intake/Output from previous day: 08/13 0701 - 08/14 0700 In: 490.1 [P.O.:390; I.V.:100.1] Out: 1451 [Urine:1450; Stool:1] Intake/Output this shift:    General appearance: alert and no distress GI: soft, non-tender; bowel sounds normal; no masses,  no organomegaly  Lab Results:  Recent Labs  04/01/15 0227 04/02/15 0553 04/03/15 0509  WBC 13.7* 10.5 12.4*  HGB 10.2* 9.8* 9.8*  HCT 28.4* 27.8* 27.4*  PLT 86* 94* 109*   BMET  Recent Labs  04/01/15 0227 04/02/15 0553 04/02/15 1530  NA 133* 132* 131*  K 3.4* 3.6 3.5  CL 107 106 102  CO2 18* 18* 20*  GLUCOSE 84 95 122*  BUN 46* 52* 54*  CREATININE 3.61* 3.72* 3.68*  CALCIUM 8.4* 8.7* 8.9   LFT  Recent Labs  03/31/15 1215  04/02/15 0553  PROT 5.4*  < > 5.1*  ALBUMIN 2.4*  < > 2.2*  AST 58*  < > 47*  ALT 174*  < > 101*  ALKPHOS 210*  < > 213*  BILITOT 7.7*  < > 10.6*  BILIDIR 5.2*  --   --   IBILI 2.5*  --   --   < > = values in this interval not displayed. PT/INR  Recent Labs  04/02/15 0553  LABPROT 17.0*  INR 1.37   Hepatitis Panel No results for input(s): HEPBSAG, HCVAB, HEPAIGM, HEPBIGM in the last 72 hours. C-Diff No results for input(s): CDIFFTOX in the last 72 hours. Fecal Lactopherrin No results for input(s): FECLLACTOFRN in the last 72 hours.  Studies/Results: No results found.  Medications:  Scheduled: . ciprofloxacin  400 mg Intravenous Q24H  . feeding supplement (ENSURE ENLIVE)  237 mL Oral BID BM  . levothyroxine  25 mcg Oral QAC breakfast  . pantoprazole   40 mg Oral Q0600  . pramipexole  1 mg Oral QHS  . sodium chloride  3 mL Intravenous Q12H   Continuous: . dextrose 5 % and 0.45% NaCl 10 mL/hr at 04/01/15 1100  . diltiazem (CARDIZEM) infusion 5 mg/hr (04/03/15 0139)    Assessment/Plan: 1) Hyperbilirubinemia. 2) Renal insufficiency.   Clinically he feels better.  MRCP and liver panel still pending this AM.  I reviewed the MRCP and his CBD appears markedly dilated, but no overt stones.  Final interpretation is pending.  Plan: 1) Await liver panel and MRCP reading. 2) Continue with supportive care.  LOS: 6 days   [name] D 04/03/2015, 7:54 AM  ADDENDUM: I reviewed the MRCP scan and there is evidence of a small distal stone.  Liver panel is pending at this time.  I will make him NPO after midnight and discuss the case with Dr. Deatra Ina to arrange an ERCP.  He is in poor health and anesthesia will be required.

## 2015-04-03 NOTE — Progress Notes (Signed)
S: Says he feels better today O:BP 128/63 mmHg  Pulse 78  Temp(Src) 97.7 F (36.5 C) (Oral)  Resp 16  Ht 5\' 11"  (1.803 m)  Wt 80.151 kg (176 lb 11.2 oz)  BMI 24.66 kg/m2  SpO2 98%  Intake/Output Summary (Last 24 hours) at 04/03/15 6387 Last data filed at 04/03/15 0600  Gross per 24 hour  Intake 490.08 ml  Output   1251 ml  Net -760.92 ml   Weight change: -1.678 kg (-3 lb 11.2 oz) FIE:PPIRJ and alert CVS: RRR 1-8/8 systolic M Resp:bibasilar crackles Abd:+ BS ND  Mild RUQ tenderness on deep palpation Ext: no edema NEURO:CNI Ox3 no asterixis   . ciprofloxacin  400 mg Intravenous Q24H  . feeding supplement (ENSURE ENLIVE)  237 mL Oral BID BM  . levothyroxine  25 mcg Oral QAC breakfast  . pantoprazole  40 mg Oral Q0600  . pramipexole  1 mg Oral QHS  . sodium chloride  3 mL Intravenous Q12H   No results found. BMET    Component Value Date/Time   NA 131* 04/02/2015 1530   NA 138 12/28/2014 0952   K 3.5 04/02/2015 1530   CL 102 04/02/2015 1530   CO2 20* 04/02/2015 1530   GLUCOSE 122* 04/02/2015 1530   GLUCOSE 84 12/28/2014 0952   BUN 54* 04/02/2015 1530   BUN 22 12/28/2014 0952   CREATININE 3.68* 04/02/2015 1530   CREATININE 1.33 02/17/2013 1108   CALCIUM 8.9 04/02/2015 1530   GFRNONAA 14* 04/02/2015 1530   GFRNONAA 49* 02/17/2013 1108   GFRAA 16* 04/02/2015 1530   GFRAA 56* 02/17/2013 1108   CBC    Component Value Date/Time   WBC 12.4* 04/03/2015 0509   WBC 7.8 12/28/2014 1006   WBC 7.9 07/28/2014 1343   RBC 3.11* 04/03/2015 0509   RBC 4.90 12/28/2014 1006   RBC 4.74 07/28/2014 1343   HGB 9.8* 04/03/2015 0509   HGB 14.3 12/28/2014 1006   HCT 27.4* 04/03/2015 0509   HCT 44.6 12/28/2014 1006   PLT 109* 04/03/2015 0509   MCV 88.1 04/03/2015 0509   MCV 90.9 12/28/2014 1006   MCH 31.5 04/03/2015 0509   MCH 29.1 12/28/2014 1006   MCH 28.9 07/28/2014 1343   MCHC 35.8 04/03/2015 0509   MCHC 32.0 12/28/2014 1006   MCHC 31.5 07/28/2014 1343   RDW 14.6  04/03/2015 0509   RDW 14.8 07/28/2014 1343   LYMPHSABS PENDING 04/03/2015 0509   LYMPHSABS 1.3 07/28/2014 1343   MONOABS PENDING 04/03/2015 0509   EOSABS PENDING 04/03/2015 0509   EOSABS 0.2 07/28/2014 1343   BASOSABS PENDING 04/03/2015 0509   BASOSABS 0.0 07/28/2014 1343     Assessment:  1. AKI sec ischemic ATN in face of ACE,  UO good.  Labs from today pending but Scr yest PM was stable 2. E Coli bacteremia 3. A fib with RVR Plan: 1. Anticipate renal fx to recover as he recovers 2. Recheck labs in AM  Maylie Ashton T

## 2015-04-03 NOTE — Progress Notes (Signed)
Cardiologist: Dr. Percival Spanish  Subjective:  Feeling better today, no chest pain, no shortness of breath, GI note reviewed  Objective:  Vital Signs in the last 24 hours: Temp:  [97.6 F (36.4 C)-97.9 F (36.6 C)] 97.7 F (36.5 C) (08/14 0725) Pulse Rate:  [60-165] 87 (08/14 0800) Resp:  [10-34] 18 (08/14 0800) BP: (79-142)/(32-73) 135/70 mmHg (08/14 0725) SpO2:  [96 %-99 %] 96 % (08/14 0800) Weight:  [176 lb 11.2 oz (80.151 kg)] 176 lb 11.2 oz (80.151 kg) (08/14 0538)  Intake/Output from previous day: 08/13 0701 - 08/14 0700 In: 490.1 [P.O.:390; I.V.:100.1] Out: 1451 [Urine:1450; Stool:1]   Physical Exam: General: Well developed, well nourished, in no acute distress. Head:  Normocephalic and atraumatic. Lungs: Clear to auscultation and percussion. Heart: Normal S1 and S2.  2/6 systolic murmur, no rubs or gallops.  Abdomen: soft, non-tender, positive bowel sounds. Protuberant Extremities: No clubbing or cyanosis. No edema. Neurologic: Alert and oriented x 3.    Lab Results:  Recent Labs  04/02/15 0553 04/03/15 0509  WBC 10.5 12.4*  HGB 9.8* 9.8*  PLT 94* 109*    Recent Labs  04/02/15 0553 04/02/15 1530  NA 132* 131*  K 3.6 3.5  CL 106 102  CO2 18* 20*  GLUCOSE 95 122*  BUN 52* 54*  CREATININE 3.72* 3.68*    Recent Labs  04/02/15 1530 04/02/15 2035  TROPONINI 0.07* 0.06*   Hepatic Function Panel  Recent Labs  03/31/15 1215  04/02/15 0553  PROT 5.4*  < > 5.1*  ALBUMIN 2.4*  < > 2.2*  AST 58*  < > 47*  ALT 174*  < > 101*  ALKPHOS 210*  < > 213*  BILITOT 7.7*  < > 10.6*  BILIDIR 5.2*  --   --   IBILI 2.5*  --   --   < > = values in this interval not displayed. No results for input(s): CHOL in the last 72 hours. No results for input(s): PROTIME in the last 72 hours.    Telemetry: Sinus rhythm, occasional PVCs. No longer atrial fibrillation Personally viewed.   EKG:  04/02/15-2213-sinus rhythm, first-degree AV block  Echocardiogram:  03/30/15- - Left ventricle: The cavity size was normal. Goodlin thickness was normal. Systolic function was normal. The estimated ejection fraction was in the range of 55% to 60%. Amaker motion was normal; there were no regional Alabi motion abnormalities. Features are consistent with a pseudonormal left ventricular filling pattern, with concomitant abnormal relaxation and increased filling pressure (grade 2 diastolic dysfunction). - Mitral valve: Calcified annulus. There was mild regurgitation. - Left atrium: The atrium was mildly to moderately dilated. - Right ventricle: The cavity size was mildly dilated. - Right atrium: The atrium was moderately dilated. - Tricuspid valve: There was moderate regurgitation. - Pulmonary arteries: Systolic pressure was moderately increased. PA peak pressure: 46 mm Hg (S).  Scheduled Meds: . ciprofloxacin  400 mg Intravenous Q24H  . feeding supplement (ENSURE ENLIVE)  237 mL Oral BID BM  . levothyroxine  25 mcg Oral QAC breakfast  . pantoprazole  40 mg Oral Q0600  . pramipexole  1 mg Oral QHS  . sodium chloride  3 mL Intravenous Q12H   Continuous Infusions: . dextrose 5 % and 0.45% NaCl 10 mL/hr at 04/01/15 1100  . diltiazem (CARDIZEM) infusion 5 mg/hr (04/03/15 0139)   PRN Meds:.albuterol, metoprolol   Assessment/Plan:  Principal Problem:   Sepsis Active Problems:   Hyperlipidemia   Osteoarthritis of right knee   Atrial  flutter   Acute respiratory failure   Severe sepsis   Bacteremia due to Gram-negative bacteria   Hiatal hernia   Abnormal LFTs   Biliary sepsis  81 year old with elevated LFTs, paroxysmal atrial fibrillation, gram-negative bacteremia  1. Paroxysmal atrial fibrillation  -Currently in sinus rhythm, feeling well.  - We will transition IV diltiazem over to by mouth diltiazem.  - I will place him on diltiazem CD 180 mg once a day.  - Note, he was on amlodipine as an outpatient, this will be discontinued  - Note,  he was on metoprolol as an outpatient, this is currently on hold. Utilizing diltiazem for atrial fibrillation currently.  2. Chronic anticoagulation  - Currently on hold, potential GI procedure  - Was on low-dose Eliquis previously  - This patients CHA2DS2-VASc Score and unadjusted Ischemic Stroke Rate (% per year) is equal to 3.2 % stroke rate/year from a score of 3  Above score calculated as 1 point each if present [CHF, HTN, DM, Vascular=MI/PAD/Aortic Plaque, Age if 65-74, or Male] Above score calculated as 2 points each if present [Age > 75, or Stroke/TIA/TE]  3. Coronary artery disease  - History of CABG 2014-postop atrial flutter was noted.  We will continue to follow.   Jniyah Dantuono, [place] 04/03/2015, 9:07 AM

## 2015-04-04 ENCOUNTER — Encounter (HOSPITAL_COMMUNITY): Payer: Self-pay | Admitting: *Deleted

## 2015-04-04 ENCOUNTER — Inpatient Hospital Stay (HOSPITAL_COMMUNITY): Payer: Medicare PPO

## 2015-04-04 ENCOUNTER — Encounter (HOSPITAL_COMMUNITY)
Admission: EM | Disposition: A | Discharge status: Skilled Nursing Facility | Payer: Self-pay | Source: Home / Self Care | Attending: Internal Medicine

## 2015-04-04 ENCOUNTER — Inpatient Hospital Stay (HOSPITAL_COMMUNITY): Payer: Medicare PPO | Admitting: Anesthesiology

## 2015-04-04 DIAGNOSIS — I484 Atypical atrial flutter: Secondary | ICD-10-CM

## 2015-04-04 DIAGNOSIS — A4151 Sepsis due to Escherichia coli [E. coli]: Principal | ICD-10-CM

## 2015-04-04 HISTORY — PX: ERCP: SHX5425

## 2015-04-04 LAB — CBC WITH DIFFERENTIAL/PLATELET
Basophils Absolute: 0 10*3/uL (ref 0.0–0.1)
Basophils Relative: 0 % (ref 0–1)
EOS PCT: 2 % (ref 0–5)
Eosinophils Absolute: 0.3 10*3/uL (ref 0.0–0.7)
HEMATOCRIT: 26.5 % — AB (ref 39.0–52.0)
HEMOGLOBIN: 9.5 g/dL — AB (ref 13.0–17.0)
LYMPHS ABS: 1.2 10*3/uL (ref 0.7–4.0)
LYMPHS PCT: 8 % — AB (ref 12–46)
MCH: 31.3 pg (ref 26.0–34.0)
MCHC: 35.8 g/dL (ref 30.0–36.0)
MCV: 87.2 fL (ref 78.0–100.0)
MONOS PCT: 6 % (ref 3–12)
Monocytes Absolute: 0.9 10*3/uL (ref 0.1–1.0)
NEUTROS ABS: 12.7 10*3/uL — AB (ref 1.7–7.7)
Neutrophils Relative %: 84 % — ABNORMAL HIGH (ref 43–77)
Platelets: 140 10*3/uL — ABNORMAL LOW (ref 150–400)
RBC: 3.04 MIL/uL — AB (ref 4.22–5.81)
RDW: 14.5 % (ref 11.5–15.5)
WBC: 15.1 10*3/uL — ABNORMAL HIGH (ref 4.0–10.5)

## 2015-04-04 LAB — COMPREHENSIVE METABOLIC PANEL
ALBUMIN: 2 g/dL — AB (ref 3.5–5.0)
ALK PHOS: 257 U/L — AB (ref 38–126)
ALT: 81 U/L — AB (ref 17–63)
ANION GAP: 9 (ref 5–15)
AST: 84 U/L — ABNORMAL HIGH (ref 15–41)
BILIRUBIN TOTAL: 16.3 mg/dL — AB (ref 0.3–1.2)
BUN: 62 mg/dL — ABNORMAL HIGH (ref 6–20)
CALCIUM: 8.8 mg/dL — AB (ref 8.9–10.3)
CO2: 21 mmol/L — AB (ref 22–32)
CREATININE: 3.67 mg/dL — AB (ref 0.61–1.24)
Chloride: 103 mmol/L (ref 101–111)
GFR calc Af Amer: 16 mL/min — ABNORMAL LOW (ref >60)
GFR calc non Af Amer: 14 mL/min — ABNORMAL LOW (ref >60)
GLUCOSE: 102 mg/dL — AB (ref 65–99)
Potassium: 3.3 mmol/L — ABNORMAL LOW (ref 3.5–5.1)
SODIUM: 133 mmol/L — AB (ref 135–145)
TOTAL PROTEIN: 4.9 g/dL — AB (ref 6.5–8.1)

## 2015-04-04 SURGERY — ERCP, WITH INTERVENTION IF INDICATED
Anesthesia: General

## 2015-04-04 MED ORDER — FENTANYL CITRATE (PF) 100 MCG/2ML IJ SOLN
INTRAMUSCULAR | Status: DC | PRN
  Administered 2015-04-04 (×4): 50 ug via INTRAVENOUS

## 2015-04-04 MED ORDER — ONDANSETRON HCL 4 MG/2ML IJ SOLN
INTRAMUSCULAR | Status: DC | PRN
  Administered 2015-04-04: 4 mg via INTRAVENOUS

## 2015-04-04 MED ORDER — POTASSIUM CHLORIDE 10 MEQ/100ML IV SOLN
10.0000 meq | INTRAVENOUS | Status: AC
  Administered 2015-04-04 (×3): 10 meq via INTRAVENOUS
  Filled 2015-04-04 (×3): qty 100

## 2015-04-04 MED ORDER — LIDOCAINE HCL (CARDIAC) 20 MG/ML IV SOLN
INTRAVENOUS | Status: DC | PRN
  Administered 2015-04-04: 60 mg via INTRAVENOUS

## 2015-04-04 MED ORDER — SUCCINYLCHOLINE CHLORIDE 20 MG/ML IJ SOLN
INTRAMUSCULAR | Status: DC | PRN
  Administered 2015-04-04: 100 mg via INTRAVENOUS

## 2015-04-04 MED ORDER — GLUCAGON HCL RDNA (DIAGNOSTIC) 1 MG IJ SOLR
INTRAMUSCULAR | Status: AC
  Filled 2015-04-04: qty 1

## 2015-04-04 MED ORDER — PHENYLEPHRINE HCL 10 MG/ML IJ SOLN
10.0000 mg | INTRAVENOUS | Status: DC | PRN
  Administered 2015-04-04: 50 ug/min via INTRAVENOUS

## 2015-04-04 MED ORDER — GLUCAGON HCL RDNA (DIAGNOSTIC) 1 MG IJ SOLR
INTRAMUSCULAR | Status: DC | PRN
  Administered 2015-04-04 (×2): 1 mg via INTRAVENOUS

## 2015-04-04 MED ORDER — PROPOFOL 10 MG/ML IV BOLUS
INTRAVENOUS | Status: DC | PRN
  Administered 2015-04-04: 50 mg via INTRAVENOUS

## 2015-04-04 MED ORDER — SODIUM CHLORIDE 0.9 % IV SOLN: INTRAVENOUS | Status: DC

## 2015-04-04 MED ORDER — EPHEDRINE SULFATE 50 MG/ML IJ SOLN
INTRAMUSCULAR | Status: DC | PRN
  Administered 2015-04-04 (×2): 10 mg via INTRAVENOUS

## 2015-04-04 MED ORDER — LACTATED RINGERS IV SOLN
INTRAVENOUS | Status: DC | PRN
  Administered 2015-04-04 (×2): via INTRAVENOUS

## 2015-04-04 MED ORDER — SODIUM CHLORIDE 0.9 % IV SOLN
INTRAVENOUS | Status: DC
  Administered 2015-04-04: 10:00:00 via INTRAVENOUS

## 2015-04-04 MED ORDER — SODIUM CHLORIDE 0.9 % IV SOLN
INTRAVENOUS | Status: DC | PRN
  Administered 2015-04-04: 15 mL

## 2015-04-04 MED ORDER — INDOMETHACIN 50 MG RE SUPP
100.0000 mg | Freq: Once | RECTAL | Status: AC
  Administered 2015-04-04: 100 mg via RECTAL
  Filled 2015-04-04 (×2): qty 2

## 2015-04-04 MED ORDER — CIPROFLOXACIN IN D5W 400 MG/200ML IV SOLN
400.0000 mg | Freq: Two times a day (BID) | INTRAVENOUS | Status: DC
  Administered 2015-04-04: 400 mg via INTRAVENOUS
  Filled 2015-04-04 (×2): qty 200

## 2015-04-04 NOTE — Progress Notes (Signed)
Daily Rounding Note  04/04/2015, 8:14 AM  LOS: 7 days   SUBJECTIVE:       Only mild pain in RUQ, comes and goes. Feels weak.  Only mobility has been to [facility] chair, but in bed all day yesterday.  Anorexia but tolerating solids.  Thirsty.   OBJECTIVE:         Vital signs in last 24 hours:    Temp:  [97.6 F (36.4 C)-98.6 F (37 C)] 97.6 F (36.4 C) (08/15 0750) Pulse Rate:  [77-135] 77 (08/15 0750) Resp:  [16-22] 18 (08/15 0750) BP: (111-148)/(49-75) 123/65 mmHg (08/15 0750) SpO2:  [95 %-97 %] 97 % (08/15 0750) Weight:  [177 lb (80.287 kg)] 177 lb (80.287 kg) (08/15 0635) Last BM Date: 04/03/15 Filed Weights   04/02/15 0420 04/03/15 0538 04/04/15 8657  Weight: 184 lb 6.4 oz (83.643 kg) 176 lb 11.2 oz (80.151 kg) 177 lb (80.287 kg)   General: pleasant, comfortable.  Looks frail   Heart: RRR.  No MRG Chest: clear bil.  No dyspnea or cough Abdomen: soft, mild tenderness in right UQ  Extremities: no CCE.   Neuro/Psych:  Pleasant, cooperative, alert.  No tremor.  Oriented x 3.    Intake/Output from previous day: 08/14 0701 - 08/15 0700 In: 1160 [P.O.:1140; I.V.:20] Out: 1375 [Urine:1375]  Intake/Output this shift:    Lab Results:  Recent Labs  04/02/15 0553 04/03/15 0509 04/04/15 0338  WBC 10.5 12.4* 15.1*  HGB 9.8* 9.8* 9.5*  HCT 27.8* 27.4* 26.5*  PLT 94* 109* 140*   BMET  Recent Labs  04/02/15 1530 04/03/15 0509 04/04/15 0338  NA 131* 134* 133*  K 3.5 3.7 3.3*  CL 102 105 103  CO2 20* 17* 21*  GLUCOSE 122* 77 102*  BUN 54* 58* 62*  CREATININE 3.68* 3.63* 3.67*  CALCIUM 8.9 8.6* 8.8*   LFT  Recent Labs  04/02/15 0553 04/03/15 1427 04/04/15 0338  PROT 5.1* 5.2* 4.9*  ALBUMIN 2.2* 2.2* 2.0*  AST 47* 79* 84*  ALT 101* 89* 81*  ALKPHOS 213* 256* 257*  BILITOT 10.6* 15.7* 16.3*  BILIDIR  --  12.8*  --   IBILI  --  2.9*  --    PT/INR  Recent Labs  04/02/15 0553  LABPROT 17.0*  INR  1.37   Hepatitis Panel No results for input(s): HEPBSAG, HCVAB, HEPAIGM, HEPBIGM in the last 72 hours.  Studies/Results: Mr Abdomen Mrcp Wo Cm  04/03/2015   CLINICAL DATA:  81 year old male with fever and abdominal pain. E coli bacteremia. Acute respiratory failure requiring intubation. Chronic renal dysfunction. Hyperbilirubinemia.  EXAM: MRI ABDOMEN WITHOUT CONTRAST  (INCLUDING MRCP)  TECHNIQUE: Multiplanar multisequence MR imaging of the abdomen was performed. Heavily T2-weighted images of the biliary and pancreatic ducts were obtained, and three-dimensional MRCP images were rendered by post processing.  COMPARISON:  CT of the abdomen and pelvis 03/28/2015.  FINDINGS: Comment: Study is limited for assessment of this visceral and/or vascular lesions by lack of IV gadolinium. Additionally, there is considerable patient respiratory motion.  Lower chest: Massive hiatal hernia. Small to moderate right pleural effusion.  Hepatobiliary: MRCP images are exceedingly limited by patient motion. With these limitations in mind, there is moderate to severe dilatation of the common bile duct and common hepatic duct. Common bile duct measures at least 15 mm in diameter. Mild intrahepatic biliary ductal dilatation is also noted. In the distal aspect of the common bile duct there is a  small signal void, which corresponds to the suspected calcified stone on prior CT scan 03/28/2015. This likely represents a ductal stone. No discrete hepatic lesions are identified on today's limited examination.  Pancreas: No definite pancreatic mass. No pancreatic ductal dilatation. No peripancreatic fluid or inflammatory changes.  Spleen: Unremarkable.  Adrenals/Urinary Tract: Adrenal glands and kidneys are grossly normal in appearance on today's limited noncontrast examination.  Stomach/Bowel: Massive hiatal hernia with predominantly intrathoracic stomach, and containing a short segment of the transverse colon.  Vascular/Lymphatic: No  definite aneurysm in the visualized abdominal arterial vasculature. No lymphadenopathy the visualized portions of the abdomen.  Other: Perinephric fluid bilaterally.  Musculoskeletal: No aggressive osseous lesions noted in the visualized portions of the skeleton.  IMPRESSION: 1. Findings remain concerning for choledocholithiasis with probable stone in the distal common bile duct. This is associated with moderate to severe extrahepatic biliary ductal dilatation and mild intrahepatic biliary ductal dilatation. Correlation with ERCP is recommended, if clinically appropriate. 2. Limited examination with additional findings, as above.   Electronically Signed   By: Vinnie Langton M.D.   On: 04/03/2015 13:19   19   Scheduled Meds: . ciprofloxacin  400 mg Intravenous Q24H  . diltiazem  180 mg Oral Daily  . feeding supplement (ENSURE ENLIVE)  237 mL Oral BID BM  . levothyroxine  25 mcg Oral QAC breakfast  . pantoprazole  40 mg Oral Q0600  . pramipexole  1 mg Oral QHS  . sodium chloride  3 mL Intravenous Q12H   Continuous Infusions: . dextrose 5 % and 0.45% NaCl 10 mL/hr at 04/01/15 1100   PRN Meds:.albuterol, metoprolol  ASSESMENT:   *  Jaundice.  Suspect CBD stone.  Grew Ecoli from blood.  Day 8 abx.    *  Large intrathoracic HH.   *  Acute thrombocytopenia.  Improving.   *  AKI.  1.3 litres urine output yesterday, 100cc so far today.  *  PAF.  On po diltiazem. Chronic Eliquis on hold.   *  Acute resp failure requiring 24 hours on vent last week.    PLAN   *  ERCP set for 1330 today.  On Cipro, so no added preop abx.  Risks of bleeding, pancreatitis, need for stent and need for vent during procedure d/w pt.  Added judicious NS at 75/hour given NPO, AKI.   Already got po Cardizem this AM.      [name]  04/04/2015, 8:14 AM Pager: [phone] Reviewed and agree with management. Sandy Salaam. Deatra Ina, M.D., [facility]

## 2015-04-04 NOTE — Progress Notes (Signed)
[facility]                                                                              Patient Demographics  [name], is a 81 y.o. male, DOB - 05/13/1927, FVC:[id number]  Admit date - 03/28/2015   Admitting Physician Donne Hazel, MD  Outpatient Primary MD for the patient is [name], MD  LOS - 7   Chief Complaint  Patient presents with  . Hypoglycemia  . 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 He had been seen at an urgent care the day prior for epigastric pain.   On EMS arrival to the home, the patient was found to be hypotensive and hypoglycemic which responded to IVF's and dextrose. The patient reported of approximately 48 hours of epigastric pain, malaise and fevers. Initial work up showed temp 101, WBC 22.8, tachycardia, hypotension, AST/ALT 454/521 / bilirubin 6.1, sr cr 2.32, Na 129 and a lactic acid of 4.56. Initial CXR showed a large hiatal hernia but no edema or consolidation. An ultrasound of the abdomen was obtained and revealed an abnormal appearance of the gallbladder with contraction, Weyer thickening and nodularity without discrete. Follow up CT of the abdomen was assessed and showed an large, non-obstructing hiatal hernia with intrathoracic stomach dilatation, colon diverticulosis, peri-nephric stranding and free fluid in the pelvis. The patient was admitted per [facility] He was also evaluated by GI and CCS who both feel findings are consistent with cholangitis and sepsis. The patient was volume resuscitated, treated with abx (aztreonam/vanco in ER, then to levaquin / flagyl), pan cultured and monitored on SDU. Unfortunately, the afternoon of 8/9, he deteriorated and required emergent  intubation with transfer to ICU.  STUDIES:/EVENTS 8/08 Admitted with abdominal pain, hypotension. 8/08 [place] ABD >> abnormal appearance of the gallbladder with contraction, Ericson thickening and nodularity, no discrete stones, no other acute abnormality. CBD 8.5 mm 8/08 CT ABD/Pelvis >> gaseous distention of the stomach, large hiatal hernia, small high attenuation structure in the distal common bile duct (? Choledocholithiasis or small periampullary duodenum diverticulum), non-specific perinephric stranding, small volume free fluid within the pelvis  03/29/15: intubated. Moved to ICU 03/30/15: Not on pressors. Extubated. Repeated transient asymptomatic hypoglycemia per RN. He has denued abd pain to CCS and GI. CCS suspecting CBD stone with obstruction based on LFT but d/w DR Carlean Purl of GI - he reviewed CT with radiology - CBD stone/dilatation presence not convincing. GI Blood culture growing Gram Negativ Coccobacilli   Assessment & Plan    Principal Problem:  Severe Sepsis with Escherichia coli bacteremia: Unclear etiology possibly gallbladder versus bowel translocation - Continue IV ciprofloxacin currently stable, not spiking any fevers - ERCP today  Active Problems:   atrial fibrillation with RVR: Currently normal sinus rhythm - Patient back in normal sinus rhythm, appreciate cardiology recommendations, transitioned to oral Cardizem today. -  Recommendations DC amlodipine outpatient, currently metoprolol on hold. - Continue to hold eliquis, restart eliquis when okay with GI and cardiology - Patient had an echo on 8/10, showed EF of 96-29%, grade 2 diastolic dysfunction    Acute hypercarbia respiratory failure - Extubated on 03/30/15, continue as needed albuterol  Acute on chronic kidney disease- Likely worsened due to ATN, new paraphimosis - Creatinine function plateaued at 3.6, nephrology following  Transaminitis with transient biliary obstruction, cholecystitis, Escherichia coli  bacteremia - Currently tolerating diet, general surgery, gastroenterology following - ERCP today  Anemia, thrombocytopenia -Platelets currently improving  Hypokalemia    Code Status:Full code   Family Communication: Discussed in detail with the patient, all imaging results, lab results explained to the patient    Disposition Plan:Not medically ready   Time Spent in minutes   25 minutes  Procedures   Mechanical ventilation Ultrasound abdomen CT abdomen  Consults   Cardiology  General surgery  GI  CCM  DVT Prophylaxis  SCD's  Medications  Scheduled Meds: . [MAR Hold] ciprofloxacin  400 mg Intravenous Q24H  . [MAR Hold] diltiazem  180 mg Oral Daily  . [MAR Hold] feeding supplement (ENSURE ENLIVE)  237 mL Oral BID BM  . [MAR Hold] indomethacin  100 mg Rectal Once  . [MAR Hold] levothyroxine  25 mcg Oral QAC breakfast  . [MAR Hold] pantoprazole  40 mg Oral Q0600  . [MAR Hold] pramipexole  1 mg Oral QHS  . [MAR Hold] sodium chloride  3 mL Intravenous Q12H   Continuous Infusions: . sodium chloride    . sodium chloride 75 mL/hr at 04/04/15 1023  . dextrose 5 % and 0.45% NaCl 10 mL/hr at 04/01/15 1100   PRN Meds:.[MAR Hold] albuterol, [MAR Hold] metoprolol   Antibiotics   Anti-infectives    Start     Dose/Rate Route Frequency Ordered Stop   03/31/15 1000  [MAR Hold]  ciprofloxacin (CIPRO) IVPB 400 mg     (MAR Hold since 04/04/15 1346)   400 mg 200 mL/hr over 60 Minutes Intravenous Every 24 hours 03/31/15 0933     03/30/15 1400  vancomycin (VANCOCIN) IVPB 1000 mg/200 mL premix  Status:  Discontinued     1,000 mg 200 mL/hr over 60 Minutes Intravenous Every 48 hours 03/30/15 0751 03/31/15 0933   03/30/15 1300  levofloxacin (LEVAQUIN) IVPB 500 mg  Status:  Discontinued     500 mg 100 mL/hr over 60 Minutes Intravenous Every 48 hours 03/28/15 1407 03/28/15 1645   03/30/15 1230  levofloxacin (LEVAQUIN) IVPB 500 mg  Status:  Discontinued     500 mg 100 mL/hr over 60  Minutes Intravenous Every 48 hours 03/28/15 1707 03/29/15 1619   03/29/15 1500  vancomycin (VANCOCIN) IVPB 750 mg/150 ml premix  Status:  Discontinued     750 mg 150 mL/hr over 60 Minutes Intravenous Every 24 hours 03/28/15 1407 03/28/15 1645   03/29/15 1400  vancomycin (VANCOCIN) IVPB 750 mg/150 ml premix  Status:  Discontinued     750 mg 150 mL/hr over 60 Minutes Intravenous Every 24 hours 03/28/15 1713 03/30/15 0750   03/29/15 1230  levofloxacin (LEVAQUIN) IVPB 750 mg  Status:  Discontinued     750 mg 100 mL/hr over 90 Minutes Intravenous  Once 03/28/15 1656 03/28/15 1707   03/28/15 2100  aztreonam (AZACTAM) 1 g in dextrose 5 % 50 mL IVPB  Status:  Discontinued     1 g 100 mL/hr over 30 Minutes Intravenous Every 8 hours 03/28/15 1407  03/28/15 1645   03/28/15 2100  aztreonam (AZACTAM) 1 g in dextrose 5 % 50 mL IVPB  Status:  Discontinued     1 g 100 mL/hr over 30 Minutes Intravenous 3 times per day 03/28/15 1713 03/31/15 0933   03/28/15 1700  levofloxacin (LEVAQUIN) IVPB 750 mg  Status:  Discontinued     750 mg 100 mL/hr over 90 Minutes Intravenous  Once 03/28/15 1645 03/28/15 1656   03/28/15 1700  metroNIDAZOLE (FLAGYL) IVPB 500 mg  Status:  Discontinued     500 mg 100 mL/hr over 60 Minutes Intravenous  Once 03/28/15 1645 03/28/15 1658   03/28/15 1700  metroNIDAZOLE (FLAGYL) IVPB 500 mg  Status:  Discontinued     500 mg 100 mL/hr over 60 Minutes Intravenous Every 8 hours 03/28/15 1658 03/31/15 0940   03/28/15 1245  vancomycin (VANCOCIN) 1,500 mg in sodium chloride 0.9 % 500 mL IVPB     1,500 mg 250 mL/hr over 120 Minutes Intravenous STAT 03/28/15 1235 03/28/15 1649   03/28/15 1230  levofloxacin (LEVAQUIN) IVPB 750 mg     750 mg 100 mL/hr over 90 Minutes Intravenous  Once 03/28/15 1228 03/28/15 1440   03/28/15 1230  aztreonam (AZACTAM) 2 g in dextrose 5 % 50 mL IVPB     2 g 100 mL/hr over 30 Minutes Intravenous  Once 03/28/15 1228 03/28/15 1354   03/28/15 1230  vancomycin  (VANCOCIN) IVPB 1000 mg/200 mL premix  Status:  Discontinued     1,000 mg 200 mL/hr over 60 Minutes Intravenous  Once 03/28/15 1228 03/28/15 1234        Subjective:   [name] was seen and examined today. In normal sinus rhythm, no complaints, planned ERCP today.  Patient denies dizziness, chest pain, abdominal pain, N/V/D/C, new weakness, numbess, tingling  Objective:   Blood pressure 149/73, pulse 77, temperature 97.6 F (36.4 C), temperature source Oral, resp. rate 18, height 5\' 11"  (1.803 m), weight 80.287 kg (177 lb), SpO2 96 %.  Wt Readings from Last 3 Encounters:  04/04/15 80.287 kg (177 lb)  02/28/15 72.213 kg (159 lb 3.2 oz)  01/26/15 73.936 kg (163 lb)     Intake/Output Summary (Last 24 hours) at 04/04/15 1448 Last data filed at 04/04/15 1122  Gross per 24 hour  Intake    420 ml  Output   1350 ml  Net   -930 ml    Exam  General: Alert and oriented x 3, NAD  HEENT:  PERRLA, EOMI,   Neck: Supple, no JVD,  CVS: S1 and S2 clear, RRR  Respiratory: CTAB  Abdomen: Soft, nontender, nondistended, + bowel sounds  Ext: no cyanosis clubbing or edema  Neuro: no new deficits  Skin: No rashes  Psych: Normal affect and demeanor, alert and oriented x3    Data Review   Micro Results Recent Results (from the past 240 hour(s))  Blood Culture (routine x 2)     Status: None   Collection Time: 03/28/15 12:40 PM  Result Value Ref Range Status   Specimen Description BLOOD RIGHT ANTECUBITAL  Final   Special Requests BOTTLES DRAWN AEROBIC AND ANAEROBIC 5CC  Final   Culture  Setup Time   Final    GRAM NEGATIVE COCCOBACILLI IN BOTH AEROBIC AND ANAEROBIC BOTTLES CRITICAL RESULT CALLED TO, READ BACK BY AND VERIFIED WITH: C GOSS RN 2300 03/28/15 A BROWNING    Culture ESCHERICHIA COLI  Final   Report Status 03/30/2015 FINAL  Final   Organism ID, Bacteria ESCHERICHIA  COLI  Final      Susceptibility   Escherichia coli - MIC*    AMPICILLIN >=32 RESISTANT Resistant      CEFAZOLIN 8 SENSITIVE Sensitive     CEFEPIME <=1 SENSITIVE Sensitive     CEFTAZIDIME <=1 SENSITIVE Sensitive     CEFTRIAXONE <=1 SENSITIVE Sensitive     CIPROFLOXACIN <=0.25 SENSITIVE Sensitive     GENTAMICIN <=1 SENSITIVE Sensitive     IMIPENEM <=0.25 SENSITIVE Sensitive     TRIMETH/SULFA <=20 SENSITIVE Sensitive     AMPICILLIN/SULBACTAM >=32 RESISTANT Resistant     PIP/TAZO 64 INTERMEDIATE Intermediate     * ESCHERICHIA COLI  Blood Culture (routine x 2)     Status: None   Collection Time: 03/28/15 12:54 PM  Result Value Ref Range Status   Specimen Description BLOOD RIGHT HAND  Final   Special Requests BOTTLES DRAWN AEROBIC AND ANAEROBIC 3CC  Final   Culture  Setup Time   Final    GRAM NEGATIVE RODS IN BOTH AEROBIC AND ANAEROBIC BOTTLES CRITICAL RESULT CALLED TO, READ BACK BY AND VERIFIED WITH: B LEAP,RN [id number] [place]    Culture   Final    ESCHERICHIA COLI SUSCEPTIBILITIES PERFORMED ON PREVIOUS CULTURE WITHIN THE LAST 5 DAYS.    Report Status 03/30/2015 FINAL  Final  Urine culture     Status: None   Collection Time: 03/28/15  1:00 PM  Result Value Ref Range Status   Specimen Description URINE, CATHETERIZED  Final   Special Requests NONE  Final   Culture NO GROWTH 2 DAYS  Final   Report Status 03/30/2015 FINAL  Final  MRSA PCR Screening     Status: None   Collection Time: 03/29/15  3:18 AM  Result Value Ref Range Status   MRSA by PCR NEGATIVE NEGATIVE Final    Comment:        The GeneXpert MRSA Assay (FDA approved for NASAL specimens only), is one component of a comprehensive MRSA colonization surveillance program. It is not intended to diagnose MRSA infection nor to guide or monitor treatment for MRSA infections.   Culture, respiratory (NON-Expectorated)     Status: None   Collection Time: 03/29/15  4:27 PM  Result Value Ref Range Status   Specimen Description TRACHEAL ASPIRATE  Final   Special Requests NONE  Final   Gram Stain   Final    FEW WBC  PRESENT,BOTH PMN AND MONONUCLEAR RARE SQUAMOUS EPITHELIAL CELLS PRESENT RARE GRAM POSITIVE COCCI IN PAIRS Performed at [facility]    Culture   Final    Non-Pathogenic Oropharyngeal-type Flora Isolated. Performed at [facility]    Report Status 03/31/2015 FINAL  Final    Radiology Reports Ct Abdomen Pelvis Wo Contrast  03/28/2015   CLINICAL DATA:  81 year old male with generalized weakness, low blood sugar and epigastric pain  EXAM: CT ABDOMEN AND PELVIS WITHOUT CONTRAST  TECHNIQUE: Multidetector CT imaging of the abdomen and pelvis was performed following the standard protocol without IV contrast.  COMPARISON:  Abdominal ultrasound performed earlier today  FINDINGS: Lower Chest: Massive hiatal hernia. Ingested oral contrast material lays within the partially intra thoracic stomach which is significantly dilated with gas. The heart is within normal limits for size. Atherosclerotic calcifications present throughout the visualized coronary arteries. No pericardial effusion. Chronic atelectasis in the bilateral lower lungs worse on the left than the right around the large hiatal hernia. Otherwise, the visualized lower lungs are essentially clear. Focal subpleural steatosis adjacent to the right middle  lobe.  Abdomen: Unenhanced CT was performed per clinician order. Lack of IV contrast limits sensitivity and specificity, especially for evaluation of abdominal/pelvic solid viscera. Mild gaseous distension of the intra-abdominal portion of the stomach. Unremarkable CT appearance of the spleen, adrenal glands and pancreas. The gallbladder is contracted. There is a mild biliary ductal dilatation. A 6 x 8 mm high attenuation structure either within or immediately adjacent to the distal common bile duct. It is unclear if this represents a small periampullary duodenum diverticulum or choledocholithiasis.  The liver is otherwise normal in size and contour. No definite liver lesion given the  limitations of absent IV contrast. Nonspecific perinephric stranding bilaterally. No hydronephrosis. No nephrolithiasis. No free fluid or suspicious adenopathy. No evidence of a bowel obstruction or focal bowel Mast thickening. Moderate volume of formed stool suggests constipation. Colonic diverticular disease without CT evidence of active inflammation.  Pelvis: The bladder is decompressed. Surgical changes of prior prostatectomy. Trace free fluid layers dependently within the pelvis.  Bones/Soft Tissues: No acute fracture or aggressive appearing lytic or blastic osseous lesion. Advanced multilevel degenerative disc disease. Small left fat containing inguinal hernia. Dextro convex scoliosis of the lumbar spine.  Vascular: Scattered atherosclerotic vascular calcifications without evidence of aneurysm. Otherwise, evaluation significantly limited in the absence of intravenous contrast.  IMPRESSION: 1. The patient's mid epigastric pain may be related to gaseous distension of the stomach including the large hiatal hernia and intra thoracic portion of the stomach. As the stomach is incompletely imaged, a component of acute or chronic gastric volvulus is difficult to exclude entirely although is clear there is not complete obstruction given passage of ingested oral contrast material into the distal bowel. 2. Small high attenuation structure in the region of the distal common bile duct may represent choledocholithiasis or a small periampullary duodenum diverticulum. A small duodenum diverticulum is favored. Recommend clinical correlation for evidence of a biliary obstruction or right upper quadrant pain. If clinically warranted, MRCP could further evaluate. 3. Nonspecific perinephric stranding bilaterally. 4. Colonic diverticular disease without CT evidence of active inflammation. 5. Small volume free fluid layers within the pelvis. This is abnormal in a male patient has and is likely secondary to an underlying infectious  or inflammatory process. 6. Advanced multilevel degenerative disc disease with dextro convex scoliosis. 7. Scattered atherosclerotic vascular calcifications.   Electronically Signed   By: Jacqulynn Cadet M.D.   On: 03/28/2015 20:15   US Abdomen Complete  03/28/2015   CLINICAL DATA:  Elevated liver function studies, elevated lactate level, epigastric pain  EXAM: ULTRASOUND ABDOMEN COMPLETE  COMPARISON:  Abdominal ultrasound of December 13, 2011  FINDINGS: Gallbladder: The gallbladder is contracted and its Empson is irregular. Stones have been described in the past but discrete stones are not observed today.  Common bile duct: Diameter: 8.5 mm  Liver: The liver exhibits no focal mass nor ductal dilation. The surface contour is normal.  IVC: No abnormality visualized.  Pancreas: The pancreas was obscured by bowel gas.  Spleen: Size and appearance within normal limits.  Right Kidney: Length: 10.5 cm. Echogenicity within normal limits. No mass or hydronephrosis visualized.  Left Kidney: Length: 10.5 cm. Echogenicity within normal limits. No mass or hydronephrosis visualized.  Abdominal aorta: No aneurysm visualized.  Other findings: No ascites is demonstrated.  IMPRESSION: 1. Abnormal appearance of the gallbladder with contraction an Zwahlen thickening and nodularity. Stones may be present but are not discretely demonstrated. 2. No acute abnormality of the liver, spleen, or kidneys. The pancreas was  obscured by bowel gas. 3. Given the patient's laboratory findings and chief complaint, further evaluation with abdominal and pelvic CT scanning would be useful.   Electronically Signed   By: David  Martinique M.D.   On: 03/28/2015 15:53   Mr Abdomen Mrcp Wo Cm  04/03/2015   CLINICAL DATA:  81 year old male with fever and abdominal pain. E coli bacteremia. Acute respiratory failure requiring intubation. Chronic renal dysfunction. Hyperbilirubinemia.  EXAM: MRI ABDOMEN WITHOUT CONTRAST  (INCLUDING MRCP)  TECHNIQUE: Multiplanar  multisequence MR imaging of the abdomen was performed. Heavily T2-weighted images of the biliary and pancreatic ducts were obtained, and three-dimensional MRCP images were rendered by post processing.  COMPARISON:  CT of the abdomen and pelvis 03/28/2015.  FINDINGS: Comment: Study is limited for assessment of this visceral and/or vascular lesions by lack of IV gadolinium. Additionally, there is considerable patient respiratory motion.  Lower chest: Massive hiatal hernia. Small to moderate right pleural effusion.  Hepatobiliary: MRCP images are exceedingly limited by patient motion. With these limitations in mind, there is moderate to severe dilatation of the common bile duct and common hepatic duct. Common bile duct measures at least 15 mm in diameter. Mild intrahepatic biliary ductal dilatation is also noted. In the distal aspect of the common bile duct there is a small signal void, which corresponds to the suspected calcified stone on prior CT scan 03/28/2015. This likely represents a ductal stone. No discrete hepatic lesions are identified on today's limited examination.  Pancreas: No definite pancreatic mass. No pancreatic ductal dilatation. No peripancreatic fluid or inflammatory changes.  Spleen: Unremarkable.  Adrenals/Urinary Tract: Adrenal glands and kidneys are grossly normal in appearance on today's limited noncontrast examination.  Stomach/Bowel: Massive hiatal hernia with predominantly intrathoracic stomach, and containing a short segment of the transverse colon.  Vascular/Lymphatic: No definite aneurysm in the visualized abdominal arterial vasculature. No lymphadenopathy the visualized portions of the abdomen.  Other: Perinephric fluid bilaterally.  Musculoskeletal: No aggressive osseous lesions noted in the visualized portions of the skeleton.  IMPRESSION: 1. Findings remain concerning for choledocholithiasis with probable stone in the distal common bile duct. This is associated with moderate to  severe extrahepatic biliary ductal dilatation and mild intrahepatic biliary ductal dilatation. Correlation with ERCP is recommended, if clinically appropriate. 2. Limited examination with additional findings, as above.   Electronically Signed   By: Vinnie Langton M.D.   On: 04/03/2015 13:19   Mr 3d Recon At Scanner  04/03/2015   CLINICAL DATA:  81 year old male with fever and abdominal pain. E coli bacteremia. Acute respiratory failure requiring intubation. Chronic renal dysfunction. Hyperbilirubinemia.  EXAM: MRI ABDOMEN WITHOUT CONTRAST  (INCLUDING MRCP)  TECHNIQUE: Multiplanar multisequence MR imaging of the abdomen was performed. Heavily T2-weighted images of the biliary and pancreatic ducts were obtained, and three-dimensional MRCP images were rendered by post processing.  COMPARISON:  CT of the abdomen and pelvis 03/28/2015.  FINDINGS: Comment: Study is limited for assessment of this visceral and/or vascular lesions by lack of IV gadolinium. Additionally, there is considerable patient respiratory motion.  Lower chest: Massive hiatal hernia. Small to moderate right pleural effusion.  Hepatobiliary: MRCP images are exceedingly limited by patient motion. With these limitations in mind, there is moderate to severe dilatation of the common bile duct and common hepatic duct. Common bile duct measures at least 15 mm in diameter. Mild intrahepatic biliary ductal dilatation is also noted. In the distal aspect of the common bile duct there is a small signal void, which corresponds to the  suspected calcified stone on prior CT scan 03/28/2015. This likely represents a ductal stone. No discrete hepatic lesions are identified on today's limited examination.  Pancreas: No definite pancreatic mass. No pancreatic ductal dilatation. No peripancreatic fluid or inflammatory changes.  Spleen: Unremarkable.  Adrenals/Urinary Tract: Adrenal glands and kidneys are grossly normal in appearance on today's limited noncontrast  examination.  Stomach/Bowel: Massive hiatal hernia with predominantly intrathoracic stomach, and containing a short segment of the transverse colon.  Vascular/Lymphatic: No definite aneurysm in the visualized abdominal arterial vasculature. No lymphadenopathy the visualized portions of the abdomen.  Other: Perinephric fluid bilaterally.  Musculoskeletal: No aggressive osseous lesions noted in the visualized portions of the skeleton.  IMPRESSION: 1. Findings remain concerning for choledocholithiasis with probable stone in the distal common bile duct. This is associated with moderate to severe extrahepatic biliary ductal dilatation and mild intrahepatic biliary ductal dilatation. Correlation with ERCP is recommended, if clinically appropriate. 2. Limited examination with additional findings, as above.   Electronically Signed   By: Vinnie Langton M.D.   On: 04/03/2015 13:19   Dg Chest Port 1 View  03/30/2015   CLINICAL DATA:  Acute respiratory failure.  EXAM: PORTABLE CHEST - 1 VIEW  COMPARISON:  03/29/2015.  FINDINGS: 1116 hours. Endotracheal tube tip is 3.5 cm above the carina. Nasogastric tube projects below the diaphragm, tip not visualized. There is interval improved aeration of the lung bases with mild residual bibasilar atelectasis. No confluent airspace opacity, edema or significant pleural effusion. Cardiomegaly and a large hiatal hernia are again noted post CABG.  IMPRESSION: Interval improved aeration of the lung bases. Stable support system.   Electronically Signed   By: Richardean Sale M.D.   On: 03/30/2015 11:44   Dg Chest Port 1 View  03/29/2015   CLINICAL DATA:  Evaluate endotracheal tube position.  EXAM: PORTABLE CHEST - 1 VIEW  COMPARISON:  03/29/2015.  FINDINGS: Support apparatus: Endotracheal tube 3 cm from the carina. Enteric tube is present with the tip not visible. Side port is in the proximal gastric fundus. Monitoring leads project over the chest.  Cardiomediastinal Silhouette:   Unchanged.  Lungs: Bilateral basilar atelectasis, greater on the LEFT when compared to the RIGHT. No pneumothorax.  Effusions: RIGHT-greater-than-LEFT pleural effusions, likely partially loculated on the RIGHT.  Other:  Large hiatal hernia with intrathoracic gastric air bubble.  IMPRESSION: 1. Endotracheal tube tip 3 cm from the carina. 2. Nasogastric tube in the stomach. 3. Cardiomegaly with bilateral pleural effusions. 4. Large hiatal hernia.   Electronically Signed   By: Dereck Ligas M.D.   On: 03/29/2015 18:40   Dg Chest Port 1 View  03/29/2015   CLINICAL DATA:  Intubation  EXAM: PORTABLE CHEST - 1 VIEW  COMPARISON:  03/28/2015  FINDINGS: Endotracheal tube just above the carina. Recommend withdrawal of 3 cm.  Hypoventilation. Elevated left hemidiaphragm with left lower lobe atelectasis unchanged. Mild right lower lobe atelectasis and small bilateral effusions.  Improved aeration of the lungs compared with earlier today  IMPRESSION: Endotracheal tube at the carina, recommend withdrawal 3 cm.  Improved aeration in the lung bases. Persistent bibasilar atelectasis has improved. Small bilateral pleural effusions.   Electronically Signed   By: Franchot Gallo M.D.   On: 03/29/2015 14:59   Dg Chest Port 1 View  03/28/2015   CLINICAL DATA:  Shortness of Breath  EXAM: PORTABLE CHEST - 1 VIEW  COMPARISON:  Study obtained earlier in the day  FINDINGS: Large hiatal type hernia is again identified with  most of the stomach above the diaphragm. The degree of inspiration shallow. There is no frank edema or consolidation. No appreciable joint effusions. Heart is mildly prominent with pulmonary vascularity within normal limits. Patient is status post coronary artery bypass grafting. No adenopathy.  IMPRESSION: Large hiatal type hernia. No edema or consolidation. Degree of inspiration shallow. No new opacity. No change in cardiac silhouette.   Electronically Signed   By: Lowella Grip III M.D.   On: 03/28/2015 18:36    Dg Chest Portable 1 View  03/28/2015   CLINICAL DATA:  Shortness of breath today  EXAM: PORTABLE CHEST - 1 VIEW  COMPARISON:  03/27/2015  FINDINGS: Cardiac shadow is stable. A large hiatal hernia is again identified. Postsurgical changes are again seen. No focal infiltrate or sizable effusion is noted.  IMPRESSION: Stable large hiatal hernia.  No acute abnormality noted.   Electronically Signed   By: Inez Catalina M.D.   On: 03/28/2015 11:45   Dg Abd Portable 1v  03/30/2015   CLINICAL DATA:  Abnormal LFT  EXAM: PORTABLE ABDOMEN - 1 VIEW  COMPARISON:  03/29/2015  FINDINGS: NG tube in the stomach. Normal bowel gas pattern. Contrast in the rectum from prior CT. Surgical clips in the pelvis.  Lumbar scoliosis and degenerative change.  No renal calculi.  IMPRESSION: Normal bowel gas pattern.  NG tube in the stomach.   Electronically Signed   By: Franchot Gallo M.D.   On: 03/30/2015 11:46   Dg Abd Portable 1v  03/29/2015   CLINICAL DATA:  Check NG tube position  EXAM: PORTABLE ABDOMEN - 1 VIEW  COMPARISON:  03/29/2015 at 1527 hours  FINDINGS: Enteric tube terminates below the diaphragm in the gastric body.  Nonobstructive bowel gas pattern.  Nodule contrast in the distal colon.  IMPRESSION: Enteric tube terminates below the diaphragm in the gastric body.   Electronically Signed   By: Julian Hy M.D.   On: 03/29/2015 16:55   Dg Abd Portable 1v  03/29/2015   CLINICAL DATA:  NG tube placement.  EXAM: PORTABLE ABDOMEN - 1 VIEW  COMPARISON:  CT scan 03/28/2015.  FINDINGS: The NG tube tip is in the lower aspect of the chest. This is likely in the distal esophagus or possibly the large hiatal hernia. The proximal port is in the mid esophagus.  The stomach demonstrates gaseous distention. There is contrast in the bowel.  IMPRESSION: The NG tube tip is in the distal esophagus or large hiatal hernia.   Electronically Signed   By: Marijo Sanes M.D.   On: 03/29/2015 15:55   US Abdomen Limited Ruq  03/31/2015    CLINICAL DATA:  Questionable choledocholithiasis on recent CT  EXAM: US ABDOMEN LIMITED - RIGHT UPPER QUADRANT  COMPARISON:  Abdominal ultrasound and CT abdomen and pelvis March 28, 2015  FINDINGS: Gallbladder:  The gallbladder is somewhat contracted. There are nonshadowing echogenic foci in the gallbladder similar to 1 day prior. The gallbladder Cartmell is upper normal in thickness. There is no pericholecystic fluid. No sonographic Murphy sign noted.  Common bile duct:  Diameter: 6 mm. There is no mass or calculus seen in the visualized portions of the common bile duct. Portions of the common bile duct are obscured by gas.  Liver:  No focal lesion identified. Within normal limits in parenchymal echogenicity.  IMPRESSION: Note that the study is less than optimal due to patient's inability to suspend respiration significantly. No choledocholithiasis is seen by ultrasound. Portions of the common bile duct,  however, are not well visualized due to the patient's inability to suspend respiration and overlying gas. There is upper normal gallbladder Nedrow thickness with several non shadowing echogenic foci in the gallbladder. Question non shadowing gallstones versus small foci of sludge.  MRCP would be the optimal study to further evaluate the common bile duct. If patient becomes able to suspend respiration for a significant period of time, consideration may wish to be given to MRCP for further assessment.   Electronically Signed   By: Lowella Grip III M.D.   On: 03/31/2015 02:25    CBC  Recent Labs Lab 03/31/15 0400 04/01/15 0227 04/02/15 0553 04/03/15 0509 04/04/15 0338  WBC 17.7* 13.7* 10.5 12.4* 15.1*  HGB 9.9* 10.2* 9.8* 9.8* 9.5*  HCT 28.1* 28.4* 27.8* 27.4* 26.5*  PLT 94* 86* 94* 109* 140*  MCV 88.9 88.2 86.9 88.1 87.2  MCH 31.3 31.7 30.6 31.5 31.3  MCHC 35.2 35.9 35.3 35.8 35.8  RDW 14.1 14.3 14.4 14.6 14.5  LYMPHSABS 0.4*  --  0.9 0.6* 1.2  MONOABS 0.9  --  0.9 1.6* 0.9  EOSABS 0.0  --  0.2  0.4 0.3  BASOSABS 0.0  --  0.0 0.0 0.0    Chemistries   Recent Labs Lab 03/30/15 0236  03/31/15 0400 03/31/15 1215 03/31/15 1845 04/01/15 0227 04/02/15 0553 04/02/15 1530 04/03/15 0509 04/03/15 1427 04/04/15 0338  NA 131*  < > 132*  --  130* 133* 132* 131* 134*  --  133*  K 3.8  < > 3.3*  --  4.7 3.4* 3.6 3.5 3.7  --  3.3*  CL 103  < > 105  --  103 107 106 102 105  --  103  CO2 15*  < > 17*  --  14* 18* 18* 20* 17*  --  21*  GLUCOSE 38*  < > 100*  --  103* 84 95 122* 77  --  102*  BUN 49*  < > 50*  --  50* 46* 52* 54* 58*  --  62*  CREATININE 2.78*  < > 3.35*  --  3.46* 3.61* 3.72* 3.68* 3.63*  --  3.67*  CALCIUM 7.4*  < > 7.8*  --  8.1* 8.4* 8.7* 8.9 8.6*  --  8.8*  MG 1.5*  --  2.1  --  2.1  --   --   --   --   --   --   AST 129*  135*  < >  --  58*  --  41 47*  --   --  79* 84*  ALT 234*  248*  < >  --  174*  --  135* 101*  --   --  89* 81*  ALKPHOS 110  109  < >  --  210*  --  210* 213*  --   --  256* 257*  BILITOT 6.2*  6.6*  < >  --  7.7*  --  8.2* 10.6*  --   --  15.7* 16.3*  < > = values in this interval not displayed. ------------------------------------------------------------------------------------------------------------------ estimated creatinine clearance is 15.4 mL/min (by C-G formula based on Cr of 3.67). ------------------------------------------------------------------------------------------------------------------ No results for input(s): HGBA1C in the last 72 hours. ------------------------------------------------------------------------------------------------------------------ No results for input(s): CHOL, HDL, LDLCALC, TRIG, CHOLHDL, LDLDIRECT in the last 72 hours. ------------------------------------------------------------------------------------------------------------------ No results for input(s): TSH, T4TOTAL, T3FREE, THYROIDAB in the last 72 hours.  Invalid input(s):  FREET3 ------------------------------------------------------------------------------------------------------------------ No results for input(s): VITAMINB12, FOLATE, FERRITIN, TIBC, IRON, RETICCTPCT  in the last 72 hours.  Coagulation profile  Recent Labs Lab 03/28/15 1834 03/30/15 1127 04/02/15 0553  INR 2.59* 1.86* 1.37    No results for input(s): DDIMER in the last 72 hours.  Cardiac Enzymes  Recent Labs Lab 04/02/15 0910 04/02/15 1530 04/02/15 2035  TROPONINI 0.58* 0.07* 0.06*   ------------------------------------------------------------------------------------------------------------------ Invalid input(s): POCBNP  No results for input(s): GLUCAP in the last 72 hours.   Undra Trembath M.D. [facility] 04/04/2015, 2:48 PM  Pager: [phone] Between 7am to 7pm - call Pager - [phone]  After 7pm go to [email or web address] - password [email or web address]  Call night coverage person covering after 7pm

## 2015-04-04 NOTE — Progress Notes (Signed)
OT Cancellation Note  Patient Details Name: [name] MRN: [id number] DOB: 1927/07/18   Cancelled Treatment:    Reason Eval/Treat Not Completed: Fatigue/lethargy limiting ability to participate. Pt. Reports he did not sleep well last night and is too tired to participate in skilled OT at this time.  Requesting to rest now.  Will check back at able.    Janice Coffin, COTA/L 04/04/2015, 8:22 AM

## 2015-04-04 NOTE — Op Note (Signed)
[facility] [place], [id number]   ERCP PROCEDURE REPORT        EXAM DATE: Apr 16, 2015  PATIENT NAME:          [name], [name]          MR #:        [id number]  BIRTHDATE:       06-11-1927     VISIT #:     [phone] ATTENDING:     Inda Castle, MD     STATUS:     outpatient ASSISTANT:      Berneta Levins and Billups, Janie   INDICATIONS:  The patient is a 81 yr old male here for an ERCP due to suspected or rule out bile duct stones. PROCEDURE PERFORMED:     ERCP, diagnostic MEDICATIONS:     Monitored anesthesia care and Cipro 400 mg IV   CONSENT: The patient understands the risks and benefits of the procedure and understands that these risks include, but are not limited to: sedation, allergic reaction, infection, perforation and/or bleeding. Alternative means of evaluation and treatment include, among others: physical exam, x-rays, and/or surgical intervention. The patient elects to proceed with this endoscopic procedure.  DESCRIPTION OF PROCEDURE: During intra-op preparation period all mechanical & medical equipment was checked for proper function. Hand hygiene and appropriate measures for infection prevention was taken. After the risks, benefits and alternatives of the procedure were thoroughly explained, Informed was verified, confirmed and timeout was successfully executed by the treatment team. With the patient in left semi-prone position, medications were administered intravenously.The Pentax Ercp Scope [id number] was passed from the mouth into the esophagus and further advanced from the esophagus into the stomach. From stomach scope was directed to the second portion of the duodenum.  Major papilla was aligned with the duodenoscope. The scope position was confirmed fluoroscopically. Rest of the findings/therapeutics are given below. The scope was then completely withdrawn from the patient and the procedure completed.  The pulse, BP, and O2 saturation were monitored and documented by the physician and the nursing staff throughout the entire procedure. The patient was cared for as planned according to standard protocol. The patient was then discharged to recovery in stable condition and with appropriate post procedure care. Estimated blood loss is zero unless otherwise noted in this procedure report.  A moderate-sized periampullary diverticulum was noted.  Multiple attempts were made to cannulate the common bile duct and the pancreatic duct with a 0.35 mm and 0.8mm guidewires.  Attempts were unsuccessful.  2 attempts were made to pass a stent over guidewire into the pancreatic duct but these were unsuccessful as well.  neither duct was convincingly opacified.    ADVERSE EVENT: IMPRESSIONS:     A moderate-sized periampullary diverticulum was noted.  Multiple attempts were made to cannulate the common bile duct and the pancreatic duct with a 0.35 mm and 0.37mm guidewires.  Attempts were unsuccessful.  2 attempts were made to pass a stent over guidewire into the pancreatic duct but these were unsuccessful as well.  neither duct was convincingly opacified  RECOMMENDATIONS:     repeat ERCP in 48 hours.  Continue antibiotics REPEAT EXAM:   ___________________________________ Inda Castle, MD eSigned:  Inda Castle, MD April 16, 2015 4:16 PM   cc:  CPT CODES: ICD9 CODES:  The ICD and CPT codes recommended by this software are interpretations from the data that the clinical staff has captured with the software.  The verification  of the translation of this report to the ICD and CPT codes and modifiers is the sole responsibility of the health care institution and practicing physician where this report was generated.  [employer] will not be held responsible for the validity of the ICD and CPT codes included on this report.  AMA assumes no liability for data contained or  not contained herein. CPT is a Designer, television/film set of the [employer].

## 2015-04-04 NOTE — Progress Notes (Signed)
PT Cancellation Note  Patient Details Name: GERMAINE SHENKER MRN: [id number] DOB: 1926-08-21   Cancelled Treatment:    Reason Eval/Treat Not Completed: Patient at procedure or test/unavailable. Patient down for test at this time. Will follow up tomorrow as appropriate   Jacqualyn Posey 04/04/2015, 1:17 PM

## 2015-04-04 NOTE — Care Management Important Message (Signed)
Important Message  Patient Details  Name: [name] MRN: [id number] Date of Birth: 10/12/1926   Medicare Important Message Given:  Yes-third notification given    Pricilla Handler 04/04/2015, 3:17 PM

## 2015-04-04 NOTE — Transfer of Care (Signed)
Immediate Anesthesia Transfer of Care Note  Patient: [name]  Procedure(s) Performed: Procedure(s): ENDOSCOPIC RETROGRADE CHOLANGIOPANCREATOGRAPHY (ERCP) (N/A)  Patient Location: PACU and Endoscopy Unit  Anesthesia Type:General  Level of Consciousness: awake, sedated, patient cooperative and responds to stimulation  Airway & Oxygen Therapy: Patient Spontanous Breathing and Patient connected to nasal cannula oxygen  Post-op Assessment: Report given to RN, Post -op Vital signs reviewed and stable and Patient moving all extremities  Post vital signs: Reviewed and stable  Last Vitals:  Filed Vitals:   04/04/15 1638  BP: 87/50  Pulse: 66  Temp:   Resp: 29    Complications: No apparent anesthesia complications

## 2015-04-04 NOTE — Anesthesia Procedure Notes (Signed)
Procedure Name: Intubation Date/Time: 04/04/2015 1:49 PM Performed by: Greggory Stallion, Madlynn Lundeen L Pre-anesthesia Checklist: Patient identified, Emergency Drugs available, Suction available, Patient being monitored and Timeout performed Patient Re-evaluated:Patient Re-evaluated prior to inductionOxygen Delivery Method: Circle system utilized Preoxygenation: Pre-oxygenation with 100% oxygen Intubation Type: IV induction and Cricoid Pressure applied Ventilation: Mask ventilation without difficulty Laryngoscope Size: Mac and 4 Grade View: Grade I Tube type: Oral Tube size: 7.5 mm Number of attempts: 1 Airway Equipment and Method: Stylet Placement Confirmation: ETT inserted through vocal cords under direct vision,  positive ETCO2 and breath sounds checked- equal and bilateral Secured at: 21 cm Tube secured with: Tape Dental Injury: Teeth and Oropharynx as per pre-operative assessment

## 2015-04-04 NOTE — Progress Notes (Signed)
Patient ID: [name], male   DOB: Jun 11, 1927, 81 y.o.   MRN: [id number]    Subjective:  Couldn't sleep last night "like I had a bunch of caffeine"  Objective:  Filed Vitals:   04/04/15 0400 04/04/15 0635 04/04/15 0655 04/04/15 0750  BP: 120/49  111/74 123/65  Pulse: 83  135 77  Temp: 97.9 F (36.6 C)   97.6 F (36.4 C)  TempSrc: Oral   Oral  Resp: 17  16 18   Height:      Weight:  80.287 kg (177 lb)    SpO2: 96%  95% 97%    Intake/Output from previous day:  Intake/Output Summary (Last 24 hours) at 04/04/15 0830 Last data filed at 04/04/15 0827  Gross per 24 hour  Intake    910 ml  Output   1475 ml  Net   -565 ml    Physical Exam: Affect appropriate Jaundice elderly white nmale  HEENT: normal Neck supple with no adenopathy JVP normal no bruits no thyromegaly Lungs clear with no wheezing and good diaphragmatic motion Heart:  S1/S2 AS murmur, no rub, gallop or click PMI normal Abdomen: benighn, BS positve, no tenderness, no AAA no bruit.  No HSM or HJR Distal pulses intact with no bruits No edema Neuro non-focal Skin warm and dry No muscular weakness   Lab Results: Basic Metabolic Panel:  Recent Labs  04/03/15 0509 04/04/15 0338  NA 134* 133*  K 3.7 3.3*  CL 105 103  CO2 17* 21*  GLUCOSE 77 102*  BUN 58* 62*  CREATININE 3.63* 3.67*  CALCIUM 8.6* 8.8*   Liver Function Tests:  Recent Labs  04/03/15 1427 04/04/15 0338  AST 79* 84*  ALT 89* 81*  ALKPHOS 256* 257*  BILITOT 15.7* 16.3*  PROT 5.2* 4.9*  ALBUMIN 2.2* 2.0*   CBC:  Recent Labs  04/03/15 0509 04/04/15 0338  WBC 12.4* 15.1*  NEUTROABS 9.8* 12.7*  HGB 9.8* 9.5*  HCT 27.4* 26.5*  MCV 88.1 87.2  PLT 109* 140*   Cardiac Enzymes:  Recent Labs  04/02/15 0910 04/02/15 1530 04/02/15 2035  TROPONINI 0.58* 0.07* 0.06*    Imaging: Mr Abdomen Mrcp Wo Cm  04/03/2015   CLINICAL DATA:  81 year old male with fever and abdominal pain. E coli bacteremia. Acute respiratory failure  requiring intubation. Chronic renal dysfunction. Hyperbilirubinemia.  EXAM: MRI ABDOMEN WITHOUT CONTRAST  (INCLUDING MRCP)  TECHNIQUE: Multiplanar multisequence MR imaging of the abdomen was performed. Heavily T2-weighted images of the biliary and pancreatic ducts were obtained, and three-dimensional MRCP images were rendered by post processing.  COMPARISON:  CT of the abdomen and pelvis 03/28/2015.  FINDINGS: Comment: Study is limited for assessment of this visceral and/or vascular lesions by lack of IV gadolinium. Additionally, there is considerable patient respiratory motion.  Lower chest: Massive hiatal hernia. Small to moderate right pleural effusion.  Hepatobiliary: MRCP images are exceedingly limited by patient motion. With these limitations in mind, there is moderate to severe dilatation of the common bile duct and common hepatic duct. Common bile duct measures at least 15 mm in diameter. Mild intrahepatic biliary ductal dilatation is also noted. In the distal aspect of the common bile duct there is a small signal void, which corresponds to the suspected calcified stone on prior CT scan 03/28/2015. This likely represents a ductal stone. No discrete hepatic lesions are identified on today's limited examination.  Pancreas: No definite pancreatic mass. No pancreatic ductal dilatation. No peripancreatic fluid or inflammatory changes.  Spleen: Unremarkable.  Adrenals/Urinary Tract: Adrenal glands and kidneys are grossly normal in appearance on today's limited noncontrast examination.  Stomach/Bowel: Massive hiatal hernia with predominantly intrathoracic stomach, and containing a short segment of the transverse colon.  Vascular/Lymphatic: No definite aneurysm in the visualized abdominal arterial vasculature. No lymphadenopathy the visualized portions of the abdomen.  Other: Perinephric fluid bilaterally.  Musculoskeletal: No aggressive osseous lesions noted in the visualized portions of the skeleton.  IMPRESSION:  1. Findings remain concerning for choledocholithiasis with probable stone in the distal common bile duct. This is associated with moderate to severe extrahepatic biliary ductal dilatation and mild intrahepatic biliary ductal dilatation. Correlation with ERCP is recommended, if clinically appropriate. 2. Limited examination with additional findings, as above.   Electronically Signed   By: Vinnie Langton M.D.   On: 04/03/2015 13:19   Mr 3d Recon At Scanner  04/03/2015   CLINICAL DATA:  81 year old male with fever and abdominal pain. E coli bacteremia. Acute respiratory failure requiring intubation. Chronic renal dysfunction. Hyperbilirubinemia.  EXAM: MRI ABDOMEN WITHOUT CONTRAST  (INCLUDING MRCP)  TECHNIQUE: Multiplanar multisequence MR imaging of the abdomen was performed. Heavily T2-weighted images of the biliary and pancreatic ducts were obtained, and three-dimensional MRCP images were rendered by post processing.  COMPARISON:  CT of the abdomen and pelvis 03/28/2015.  FINDINGS: Comment: Study is limited for assessment of this visceral and/or vascular lesions by lack of IV gadolinium. Additionally, there is considerable patient respiratory motion.  Lower chest: Massive hiatal hernia. Small to moderate right pleural effusion.  Hepatobiliary: MRCP images are exceedingly limited by patient motion. With these limitations in mind, there is moderate to severe dilatation of the common bile duct and common hepatic duct. Common bile duct measures at least 15 mm in diameter. Mild intrahepatic biliary ductal dilatation is also noted. In the distal aspect of the common bile duct there is a small signal void, which corresponds to the suspected calcified stone on prior CT scan 03/28/2015. This likely represents a ductal stone. No discrete hepatic lesions are identified on today's limited examination.  Pancreas: No definite pancreatic mass. No pancreatic ductal dilatation. No peripancreatic fluid or inflammatory changes.   Spleen: Unremarkable.  Adrenals/Urinary Tract: Adrenal glands and kidneys are grossly normal in appearance on today's limited noncontrast examination.  Stomach/Bowel: Massive hiatal hernia with predominantly intrathoracic stomach, and containing a short segment of the transverse colon.  Vascular/Lymphatic: No definite aneurysm in the visualized abdominal arterial vasculature. No lymphadenopathy the visualized portions of the abdomen.  Other: Perinephric fluid bilaterally.  Musculoskeletal: No aggressive osseous lesions noted in the visualized portions of the skeleton.  IMPRESSION: 1. Findings remain concerning for choledocholithiasis with probable stone in the distal common bile duct. This is associated with moderate to severe extrahepatic biliary ductal dilatation and mild intrahepatic biliary ductal dilatation. Correlation with ERCP is recommended, if clinically appropriate. 2. Limited examination with additional findings, as above.   Electronically Signed   By: Vinnie Langton M.D.   On: 04/03/2015 13:19    Cardiac Studies:  ECG:    Telemetry:  NSR PVC  Has converted from PAF  04/04/2015   Echo:  03/25/15  EF 55-60%  AV sclerosis not stenosis   Medications:   . ciprofloxacin  400 mg Intravenous Q24H  . diltiazem  180 mg Oral Daily  . feeding supplement (ENSURE ENLIVE)  237 mL Oral BID BM  . levothyroxine  25 mcg Oral QAC breakfast  . pantoprazole  40 mg Oral Q0600  . pramipexole  1 mg Oral  QHS  . sodium chloride  3 mL Intravenous Q12H     . dextrose 5 % and 0.45% NaCl 10 mL/hr at 04/01/15 1100    Assessment/Plan:  PAF:  On PO cardizem now per Dr Percival Spanish no anticoagulation given need for ERCP Elevated Troponin:  Back down no chest pain no plans for invasive evalaution Cholycystitis:  With ecoli bacteremia.  On cipro plans for ERCP per GI  Jenkins Rouge 04/04/2015, 8:30 AM

## 2015-04-04 NOTE — Anesthesia Preprocedure Evaluation (Signed)
Anesthesia Evaluation  Patient identified by MRN, date of birth, ID band Patient awake    Reviewed: Allergy & Precautions, NPO status , Patient's Chart, lab work & pertinent test results  Airway Mallampati: III  TM Distance: >3 FB Neck ROM: Full    Dental  (+) Partial Upper, Dental Advisory Given   Pulmonary former smoker,  breath sounds clear to auscultation  Pulmonary exam normal       Cardiovascular hypertension, Pt. on home beta blockers and Pt. on medications + CAD negative cardio ROS Normal cardiovascular exam+ dysrhythmias Atrial Fibrillation Rhythm:Regular Rate:Normal     Neuro/Psych    GI/Hepatic Neg liver ROS, hiatal hernia, GERD-  Medicated and Controlled,  Endo/Other  Hypothyroidism   Renal/GU ARFRenal disease     Musculoskeletal  (+) Arthritis -, Osteoarthritis,    Abdominal   Peds  Hematology negative hematology ROS (+)   Anesthesia Other Findings Day of surgery medications reviewed with the patient.  Reproductive/Obstetrics                             Anesthesia Physical Anesthesia Plan  ASA: III  Anesthesia Plan: General   Post-op Pain Management:    Induction: Intravenous  Airway Management Planned: Oral ETT  Additional Equipment:   Intra-op Plan:   Post-operative Plan:   Informed Consent: I have reviewed the patients History and Physical, chart, labs and discussed the procedure including the risks, benefits and alternatives for the proposed anesthesia with the patient or authorized representative who has indicated his/her understanding and acceptance.   Dental advisory given  Plan Discussed with: CRNA and Anesthesiologist  Anesthesia Plan Comments: (Discussed risks/benefits/alternatives to general anesthesia with the patient including damage to teeth, lips, gums, and tongue, sore throat, nausea/vomiting, allergic reactions to medications, possible need for  post-op ventilation, and heart attack, stroke, and death.  Patient questions answered.  Patient wishes to proceed. )        Anesthesia Quick Evaluation

## 2015-04-04 NOTE — Progress Notes (Signed)
Assessment:  1. AKI sec ischemic ATN in face of ACE, UO good. Labs stable 2. E Coli bacteremia 3. A fib with RVR 4 ?cholodocholithiasis Plan: 1. Anticipate renal fx to recover as he recovers--supportive tx. 2. Recheck labs in AM 3 For ERCP  Subjective: Interval History: No new complaints  Objective: Vital signs in last 24 hours: Temp:  [97.6 F (36.4 C)-98.6 F (37 C)] 97.6 F (36.4 C) (08/15 0750) Pulse Rate:  [77-135] 77 (08/15 0750) Resp:  [16-20] 18 (08/15 1313) BP: (111-149)/(49-75) 149/73 mmHg (08/15 1313) SpO2:  [95 %-97 %] 96 % (08/15 1313) Weight:  [80.287 kg (177 lb)] 80.287 kg (177 lb) (08/15 0635) Weight change: 0.136 kg (4.8 oz)  Intake/Output from previous day: 08/14 0701 - 08/15 0700 In: 1160 [P.O.:1140; I.V.:20] Out: 1375 [Urine:1375] Intake/Output this shift: Total I/O In: -  Out: 200 [Urine:200]  General appearance: alert and cooperative Head: Normocephalic, without obvious abnormality, atraumatic Resp: clear to auscultation bilaterally Chest [place]: no tenderness GI: mild RUQ tender Extremities: extremities normal, atraumatic, no cyanosis or edema  Lab Results:  Recent Labs  04/03/15 0509 04/04/15 0338  WBC 12.4* 15.1*  HGB 9.8* 9.5*  HCT 27.4* 26.5*  PLT 109* 140*   BMET:  Recent Labs  04/03/15 0509 04/04/15 0338  NA 134* 133*  K 3.7 3.3*  CL 105 103  CO2 17* 21*  GLUCOSE 77 102*  BUN 58* 62*  CREATININE 3.63* 3.67*  CALCIUM 8.6* 8.8*   No results for input(s): PTH in the last 72 hours. Iron Studies: No results for input(s): IRON, TIBC, TRANSFERRIN, FERRITIN in the last 72 hours. Studies/Results: Mr Abdomen Mrcp Wo Cm  04/03/2015   CLINICAL DATA:  81 year old male with fever and abdominal pain. E coli bacteremia. Acute respiratory failure requiring intubation. Chronic renal dysfunction. Hyperbilirubinemia.  EXAM: MRI ABDOMEN WITHOUT CONTRAST  (INCLUDING MRCP)  TECHNIQUE: Multiplanar multisequence MR imaging of the abdomen was  performed. Heavily T2-weighted images of the biliary and pancreatic ducts were obtained, and three-dimensional MRCP images were rendered by post processing.  COMPARISON:  CT of the abdomen and pelvis 03/28/2015.  FINDINGS: Comment: Study is limited for assessment of this visceral and/or vascular lesions by lack of IV gadolinium. Additionally, there is considerable patient respiratory motion.  Lower chest: Massive hiatal hernia. Small to moderate right pleural effusion.  Hepatobiliary: MRCP images are exceedingly limited by patient motion. With these limitations in mind, there is moderate to severe dilatation of the common bile duct and common hepatic duct. Common bile duct measures at least 15 mm in diameter. Mild intrahepatic biliary ductal dilatation is also noted. In the distal aspect of the common bile duct there is a small signal void, which corresponds to the suspected calcified stone on prior CT scan 03/28/2015. This likely represents a ductal stone. No discrete hepatic lesions are identified on today's limited examination.  Pancreas: No definite pancreatic mass. No pancreatic ductal dilatation. No peripancreatic fluid or inflammatory changes.  Spleen: Unremarkable.  Adrenals/Urinary Tract: Adrenal glands and kidneys are grossly normal in appearance on today's limited noncontrast examination.  Stomach/Bowel: Massive hiatal hernia with predominantly intrathoracic stomach, and containing a short segment of the transverse colon.  Vascular/Lymphatic: No definite aneurysm in the visualized abdominal arterial vasculature. No lymphadenopathy the visualized portions of the abdomen.  Other: Perinephric fluid bilaterally.  Musculoskeletal: No aggressive osseous lesions noted in the visualized portions of the skeleton.  IMPRESSION: 1. Findings remain concerning for choledocholithiasis with probable stone in the distal common bile duct.  This is associated with moderate to severe extrahepatic biliary ductal dilatation and  mild intrahepatic biliary ductal dilatation. Correlation with ERCP is recommended, if clinically appropriate. 2. Limited examination with additional findings, as above.   Electronically Signed   By: Vinnie Langton M.D.   On: 04/03/2015 13:19   Mr 3d Recon At Scanner  04/03/2015   CLINICAL DATA:  81 year old male with fever and abdominal pain. E coli bacteremia. Acute respiratory failure requiring intubation. Chronic renal dysfunction. Hyperbilirubinemia.  EXAM: MRI ABDOMEN WITHOUT CONTRAST  (INCLUDING MRCP)  TECHNIQUE: Multiplanar multisequence MR imaging of the abdomen was performed. Heavily T2-weighted images of the biliary and pancreatic ducts were obtained, and three-dimensional MRCP images were rendered by post processing.  COMPARISON:  CT of the abdomen and pelvis 03/28/2015.  FINDINGS: Comment: Study is limited for assessment of this visceral and/or vascular lesions by lack of IV gadolinium. Additionally, there is considerable patient respiratory motion.  Lower chest: Massive hiatal hernia. Small to moderate right pleural effusion.  Hepatobiliary: MRCP images are exceedingly limited by patient motion. With these limitations in mind, there is moderate to severe dilatation of the common bile duct and common hepatic duct. Common bile duct measures at least 15 mm in diameter. Mild intrahepatic biliary ductal dilatation is also noted. In the distal aspect of the common bile duct there is a small signal void, which corresponds to the suspected calcified stone on prior CT scan 03/28/2015. This likely represents a ductal stone. No discrete hepatic lesions are identified on today's limited examination.  Pancreas: No definite pancreatic mass. No pancreatic ductal dilatation. No peripancreatic fluid or inflammatory changes.  Spleen: Unremarkable.  Adrenals/Urinary Tract: Adrenal glands and kidneys are grossly normal in appearance on today's limited noncontrast examination.  Stomach/Bowel: Massive hiatal hernia with  predominantly intrathoracic stomach, and containing a short segment of the transverse colon.  Vascular/Lymphatic: No definite aneurysm in the visualized abdominal arterial vasculature. No lymphadenopathy the visualized portions of the abdomen.  Other: Perinephric fluid bilaterally.  Musculoskeletal: No aggressive osseous lesions noted in the visualized portions of the skeleton.  IMPRESSION: 1. Findings remain concerning for choledocholithiasis with probable stone in the distal common bile duct. This is associated with moderate to severe extrahepatic biliary ductal dilatation and mild intrahepatic biliary ductal dilatation. Correlation with ERCP is recommended, if clinically appropriate. 2. Limited examination with additional findings, as above.   Electronically Signed   By: Vinnie Langton M.D.   On: 04/03/2015 13:19    Scheduled: . ciprofloxacin  400 mg Intravenous Q24H  . diltiazem  180 mg Oral Daily  . feeding supplement (ENSURE ENLIVE)  237 mL Oral BID BM  . levothyroxine  25 mcg Oral QAC breakfast  . pantoprazole  40 mg Oral Q0600  . potassium chloride  10 mEq Intravenous Q1 Hr x 3  . pramipexole  1 mg Oral QHS  . sodium chloride  3 mL Intravenous Q12H     LOS: 7 days   Mir Fullilove C 04/04/2015,1:34 PM

## 2015-04-04 NOTE — Progress Notes (Signed)
ERCP was not successful in cannulating the common bile duct.  Plan repeat ERCP in 48 hours

## 2015-04-04 NOTE — Progress Notes (Signed)
[facility] Progress Note     Subjective: Pt doing well, less pain.  No N/V, hungry/thirsty, but NPO for ercp.  Wants to know if I'll dial him a 1-800 number so he can call his insurance.  Objective: Vital signs in last 24 hours: Temp:  [97.6 F (36.4 C)-98.6 F (37 C)] 97.6 F (36.4 C) (08/15 0750) Pulse Rate:  [77-135] 77 (08/15 0750) Resp:  [16-22] 18 (08/15 0750) BP: (111-148)/(49-75) 123/65 mmHg (08/15 0750) SpO2:  [95 %-97 %] 97 % (08/15 0750) Weight:  [80.287 kg (177 lb)] 80.287 kg (177 lb) (08/15 0635) Last BM Date: 04/03/15  Intake/Output from previous day: 08/14 0701 - 08/15 0700 In: 1160 [P.O.:1140; I.V.:20] Out: 1375 [Urine:1375] Intake/Output this shift: Total I/O In: -  Out: 100 [Urine:100]  PE: Gen:  Alert, NAD, pleasant Abd: Soft, NT/ND, +BS, no HSM Skin:  Jaundice   Lab Results:   Recent Labs  04/03/15 0509 04/04/15 0338  WBC 12.4* 15.1*  HGB 9.8* 9.5*  HCT 27.4* 26.5*  PLT 109* 140*   BMET  Recent Labs  04/03/15 0509 04/04/15 0338  NA 134* 133*  K 3.7 3.3*  CL 105 103  CO2 17* 21*  GLUCOSE 77 102*  BUN 58* 62*  CREATININE 3.63* 3.67*  CALCIUM 8.6* 8.8*   PT/INR  Recent Labs  04/02/15 0553  LABPROT 17.0*  INR 1.37   CMP     Component Value Date/Time   NA 133* 04/04/2015 0338   NA 138 12/28/2014 0952   K 3.3* 04/04/2015 0338   CL 103 04/04/2015 0338   CO2 21* 04/04/2015 0338   GLUCOSE 102* 04/04/2015 0338   GLUCOSE 84 12/28/2014 0952   BUN 62* 04/04/2015 0338   BUN 22 12/28/2014 0952   CREATININE 3.67* 04/04/2015 0338   CREATININE 1.33 02/17/2013 1108   CALCIUM 8.8* 04/04/2015 0338   PROT 4.9* 04/04/2015 0338   PROT 6.8 12/28/2014 0952   ALBUMIN 2.0* 04/04/2015 0338   AST 84* 04/04/2015 0338   ALT 81* 04/04/2015 0338   ALKPHOS 257* 04/04/2015 0338   BILITOT 16.3* 04/04/2015 0338   BILITOT 0.8 12/28/2014 0952   GFRNONAA 14* 04/04/2015 0338   GFRNONAA 49* 02/17/2013 1108   GFRAA 16* 04/04/2015  0338   GFRAA 56* 02/17/2013 1108   Lipase     Component Value Date/Time   LIPASE 12* 03/28/2015 1205       Studies/Results: Mr Abdomen Mrcp Wo Cm  04/03/2015   CLINICAL DATA:  81 year old male with fever and abdominal pain. E coli bacteremia. Acute respiratory failure requiring intubation. Chronic renal dysfunction. Hyperbilirubinemia.  EXAM: MRI ABDOMEN WITHOUT CONTRAST  (INCLUDING MRCP)  TECHNIQUE: Multiplanar multisequence MR imaging of the abdomen was performed. Heavily T2-weighted images of the biliary and pancreatic ducts were obtained, and three-dimensional MRCP images were rendered by post processing.  COMPARISON:  CT of the abdomen and pelvis 03/28/2015.  FINDINGS: Comment: Study is limited for assessment of this visceral and/or vascular lesions by lack of IV gadolinium. Additionally, there is considerable patient respiratory motion.  Lower chest: Massive hiatal hernia. Small to moderate right pleural effusion.  Hepatobiliary: MRCP images are exceedingly limited by patient motion. With these limitations in mind, there is moderate to severe dilatation of the common bile duct and common hepatic duct. Common bile duct measures at least 15 mm in diameter. Mild intrahepatic biliary ductal dilatation is also noted. In the distal aspect of the common bile duct there is a small signal void, which corresponds  to the suspected calcified stone on prior CT scan 03/28/2015. This likely represents a ductal stone. No discrete hepatic lesions are identified on today's limited examination.  Pancreas: No definite pancreatic mass. No pancreatic ductal dilatation. No peripancreatic fluid or inflammatory changes.  Spleen: Unremarkable.  Adrenals/Urinary Tract: Adrenal glands and kidneys are grossly normal in appearance on today's limited noncontrast examination.  Stomach/Bowel: Massive hiatal hernia with predominantly intrathoracic stomach, and containing a short segment of the transverse colon.   Vascular/Lymphatic: No definite aneurysm in the visualized abdominal arterial vasculature. No lymphadenopathy the visualized portions of the abdomen.  Other: Perinephric fluid bilaterally.  Musculoskeletal: No aggressive osseous lesions noted in the visualized portions of the skeleton.  IMPRESSION: 1. Findings remain concerning for choledocholithiasis with probable stone in the distal common bile duct. This is associated with moderate to severe extrahepatic biliary ductal dilatation and mild intrahepatic biliary ductal dilatation. Correlation with ERCP is recommended, if clinically appropriate. 2. Limited examination with additional findings, as above.   Electronically Signed   By: Vinnie Langton M.D.   On: 04/03/2015 13:19   Mr 3d Recon At Scanner  04/03/2015   CLINICAL DATA:  81 year old male with fever and abdominal pain. E coli bacteremia. Acute respiratory failure requiring intubation. Chronic renal dysfunction. Hyperbilirubinemia.  EXAM: MRI ABDOMEN WITHOUT CONTRAST  (INCLUDING MRCP)  TECHNIQUE: Multiplanar multisequence MR imaging of the abdomen was performed. Heavily T2-weighted images of the biliary and pancreatic ducts were obtained, and three-dimensional MRCP images were rendered by post processing.  COMPARISON:  CT of the abdomen and pelvis 03/28/2015.  FINDINGS: Comment: Study is limited for assessment of this visceral and/or vascular lesions by lack of IV gadolinium. Additionally, there is considerable patient respiratory motion.  Lower chest: Massive hiatal hernia. Small to moderate right pleural effusion.  Hepatobiliary: MRCP images are exceedingly limited by patient motion. With these limitations in mind, there is moderate to severe dilatation of the common bile duct and common hepatic duct. Common bile duct measures at least 15 mm in diameter. Mild intrahepatic biliary ductal dilatation is also noted. In the distal aspect of the common bile duct there is a small signal void, which corresponds  to the suspected calcified stone on prior CT scan 03/28/2015. This likely represents a ductal stone. No discrete hepatic lesions are identified on today's limited examination.  Pancreas: No definite pancreatic mass. No pancreatic ductal dilatation. No peripancreatic fluid or inflammatory changes.  Spleen: Unremarkable.  Adrenals/Urinary Tract: Adrenal glands and kidneys are grossly normal in appearance on today's limited noncontrast examination.  Stomach/Bowel: Massive hiatal hernia with predominantly intrathoracic stomach, and containing a short segment of the transverse colon.  Vascular/Lymphatic: No definite aneurysm in the visualized abdominal arterial vasculature. No lymphadenopathy the visualized portions of the abdomen.  Other: Perinephric fluid bilaterally.  Musculoskeletal: No aggressive osseous lesions noted in the visualized portions of the skeleton.  IMPRESSION: 1. Findings remain concerning for choledocholithiasis with probable stone in the distal common bile duct. This is associated with moderate to severe extrahepatic biliary ductal dilatation and mild intrahepatic biliary ductal dilatation. Correlation with ERCP is recommended, if clinically appropriate. 2. Limited examination with additional findings, as above.   Electronically Signed   By: Vinnie Langton M.D.   On: 04/03/2015 13:19    Anti-infectives: Anti-infectives    Start     Dose/Rate Route Frequency Ordered Stop   03/31/15 1000  ciprofloxacin (CIPRO) IVPB 400 mg     400 mg 200 mL/hr over 60 Minutes Intravenous Every 24 hours 03/31/15  0175     03/30/15 1400  vancomycin (VANCOCIN) IVPB 1000 mg/200 mL premix  Status:  Discontinued     1,000 mg 200 mL/hr over 60 Minutes Intravenous Every 48 hours 03/30/15 0751 03/31/15 0933   03/30/15 1300  levofloxacin (LEVAQUIN) IVPB 500 mg  Status:  Discontinued     500 mg 100 mL/hr over 60 Minutes Intravenous Every 48 hours 03/28/15 1407 03/28/15 1645   03/30/15 1230  levofloxacin (LEVAQUIN)  IVPB 500 mg  Status:  Discontinued     500 mg 100 mL/hr over 60 Minutes Intravenous Every 48 hours 03/28/15 1707 03/29/15 1619   03/29/15 1500  vancomycin (VANCOCIN) IVPB 750 mg/150 ml premix  Status:  Discontinued     750 mg 150 mL/hr over 60 Minutes Intravenous Every 24 hours 03/28/15 1407 03/28/15 1645   03/29/15 1400  vancomycin (VANCOCIN) IVPB 750 mg/150 ml premix  Status:  Discontinued     750 mg 150 mL/hr over 60 Minutes Intravenous Every 24 hours 03/28/15 1713 03/30/15 0750   03/29/15 1230  levofloxacin (LEVAQUIN) IVPB 750 mg  Status:  Discontinued     750 mg 100 mL/hr over 90 Minutes Intravenous  Once 03/28/15 1656 03/28/15 1707   03/28/15 2100  aztreonam (AZACTAM) 1 g in dextrose 5 % 50 mL IVPB  Status:  Discontinued     1 g 100 mL/hr over 30 Minutes Intravenous Every 8 hours 03/28/15 1407 03/28/15 1645   03/28/15 2100  aztreonam (AZACTAM) 1 g in dextrose 5 % 50 mL IVPB  Status:  Discontinued     1 g 100 mL/hr over 30 Minutes Intravenous 3 times per day 03/28/15 1713 03/31/15 0933   03/28/15 1700  levofloxacin (LEVAQUIN) IVPB 750 mg  Status:  Discontinued     750 mg 100 mL/hr over 90 Minutes Intravenous  Once 03/28/15 1645 03/28/15 1656   03/28/15 1700  metroNIDAZOLE (FLAGYL) IVPB 500 mg  Status:  Discontinued     500 mg 100 mL/hr over 60 Minutes Intravenous  Once 03/28/15 1645 03/28/15 1658   03/28/15 1700  metroNIDAZOLE (FLAGYL) IVPB 500 mg  Status:  Discontinued     500 mg 100 mL/hr over 60 Minutes Intravenous Every 8 hours 03/28/15 1658 03/31/15 0940   03/28/15 1245  vancomycin (VANCOCIN) 1,500 mg in sodium chloride 0.9 % 500 mL IVPB     1,500 mg 250 mL/hr over 120 Minutes Intravenous STAT 03/28/15 1235 03/28/15 1649   03/28/15 1230  levofloxacin (LEVAQUIN) IVPB 750 mg     750 mg 100 mL/hr over 90 Minutes Intravenous  Once 03/28/15 1228 03/28/15 1440   03/28/15 1230  aztreonam (AZACTAM) 2 g in dextrose 5 % 50 mL IVPB     2 g 100 mL/hr over 30 Minutes Intravenous  Once  03/28/15 1228 03/28/15 1354   03/28/15 1230  vancomycin (VANCOCIN) IVPB 1000 mg/200 mL premix  Status:  Discontinued     1,000 mg 200 mL/hr over 60 Minutes Intravenous  Once 03/28/15 1228 03/28/15 1234       Assessment/Plan Abdominal pain Leukocytosis - 15.1 Transaminitis Elevated Alk Phos - 257 Hyperbilirubinemia - 16.3 Dilated CBD -Concerning for stones or tumor -NPO for ERCP today -On cipro -No evidence of cholecystitis, no plans for cholecystectomy this admission currently.  Will sign off, call with questions/concerns. -Can follow up in the office with Dr. Brantley Stage as an outpatient.  Renal insufficiency -Per Renal PAF/Elevated troponins  -Per cardiology    LOS: 7 days    Nat Christen  04/04/2015, 10:22 AM Pager: [phone]

## 2015-04-05 ENCOUNTER — Encounter (HOSPITAL_COMMUNITY): Payer: Self-pay | Admitting: Gastroenterology

## 2015-04-05 DIAGNOSIS — I483 Typical atrial flutter: Secondary | ICD-10-CM

## 2015-04-05 DIAGNOSIS — K8050 Calculus of bile duct without cholangitis or cholecystitis without obstruction: Secondary | ICD-10-CM

## 2015-04-05 LAB — CBC WITH DIFFERENTIAL/PLATELET
BASOS ABS: 0 10*3/uL (ref 0.0–0.1)
Basophils Relative: 0 % (ref 0–1)
Eosinophils Absolute: 0.5 10*3/uL (ref 0.0–0.7)
Eosinophils Relative: 3 % (ref 0–5)
HCT: 23.9 % — ABNORMAL LOW (ref 39.0–52.0)
HEMOGLOBIN: 8.5 g/dL — AB (ref 13.0–17.0)
LYMPHS ABS: 0.9 10*3/uL (ref 0.7–4.0)
Lymphocytes Relative: 6 % — ABNORMAL LOW (ref 12–46)
MCH: 31.6 pg (ref 26.0–34.0)
MCHC: 35.6 g/dL (ref 30.0–36.0)
MCV: 88.8 fL (ref 78.0–100.0)
MONO ABS: 0.9 10*3/uL (ref 0.1–1.0)
MONOS PCT: 6 % (ref 3–12)
Neutro Abs: 13 10*3/uL — ABNORMAL HIGH (ref 1.7–7.7)
Neutrophils Relative %: 85 % — ABNORMAL HIGH (ref 43–77)
Platelets: 147 10*3/uL — ABNORMAL LOW (ref 150–400)
RBC: 2.69 MIL/uL — AB (ref 4.22–5.81)
RDW: 14.8 % (ref 11.5–15.5)
WBC: 15.3 10*3/uL — AB (ref 4.0–10.5)

## 2015-04-05 LAB — COMPREHENSIVE METABOLIC PANEL
ALBUMIN: 1.9 g/dL — AB (ref 3.5–5.0)
ALT: 71 U/L — ABNORMAL HIGH (ref 17–63)
ANION GAP: 10 (ref 5–15)
AST: 68 U/L — ABNORMAL HIGH (ref 15–41)
Alkaline Phosphatase: 263 U/L — ABNORMAL HIGH (ref 38–126)
BUN: 60 mg/dL — ABNORMAL HIGH (ref 6–20)
CHLORIDE: 101 mmol/L (ref 101–111)
CO2: 19 mmol/L — AB (ref 22–32)
Calcium: 8.2 mg/dL — ABNORMAL LOW (ref 8.9–10.3)
Creatinine, Ser: 3.63 mg/dL — ABNORMAL HIGH (ref 0.61–1.24)
GFR calc Af Amer: 16 mL/min — ABNORMAL LOW (ref >60)
GFR calc non Af Amer: 14 mL/min — ABNORMAL LOW (ref >60)
GLUCOSE: 56 mg/dL — AB (ref 65–99)
POTASSIUM: 3.8 mmol/L (ref 3.5–5.1)
SODIUM: 130 mmol/L — AB (ref 135–145)
Total Bilirubin: 14 mg/dL — ABNORMAL HIGH (ref 0.3–1.2)
Total Protein: 4.7 g/dL — ABNORMAL LOW (ref 6.5–8.1)

## 2015-04-05 MED ORDER — POLYVINYL ALCOHOL 1.4 % OP SOLN
1.0000 [drp] | OPHTHALMIC | Status: DC | PRN
  Filled 2015-04-05: qty 15

## 2015-04-05 MED ORDER — ZOLPIDEM TARTRATE 5 MG PO TABS
5.0000 mg | ORAL_TABLET | Freq: Once | ORAL | Status: AC
  Administered 2015-04-05: 5 mg via ORAL
  Filled 2015-04-05: qty 1

## 2015-04-05 MED ORDER — DOCUSATE SODIUM 100 MG PO CAPS: 100.0000 mg | ORAL_CAPSULE | Freq: Every day | ORAL | Status: DC | PRN

## 2015-04-05 MED ORDER — DEXTROSE-NACL 5-0.45 % IV SOLN
INTRAVENOUS | Status: DC
  Administered 2015-04-05 – 2015-04-07 (×3): via INTRAVENOUS

## 2015-04-05 MED ORDER — CIPROFLOXACIN IN D5W 400 MG/200ML IV SOLN
400.0000 mg | INTRAVENOUS | Status: DC
  Administered 2015-04-05 – 2015-04-06 (×2): 400 mg via INTRAVENOUS
  Filled 2015-04-05 (×2): qty 200

## 2015-04-05 NOTE — Progress Notes (Signed)
UR Completed Adyen Bifulco Graves-Bigelow, RN,BSN [phone]  

## 2015-04-05 NOTE — Progress Notes (Signed)
[facility]                                                                              Patient Demographics  [name], is a 81 y.o. male, DOB - 10-Feb-1927, ZDG:[id number]  Admit date - 03/28/2015   Admitting Physician Donne Hazel, MD  Outpatient Primary MD for the patient is [name], MD  LOS - 8   Chief Complaint  Patient presents with  . Hypoglycemia  . 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 He had been seen at an urgent care the day prior for epigastric pain.   On EMS arrival to the home, the patient was found to be hypotensive and hypoglycemic which responded to IVF's and dextrose. The patient reported of approximately 48 hours of epigastric pain, malaise and fevers. Initial work up showed temp 101, WBC 22.8, tachycardia, hypotension, AST/ALT 454/521 / bilirubin 6.1, sr cr 2.32, Na 129 and a lactic acid of 4.56. Initial CXR showed a large hiatal hernia but no edema or consolidation. An ultrasound of the abdomen was obtained and revealed an abnormal appearance of the gallbladder with contraction, Deyarmin thickening and nodularity without discrete. Follow up CT of the abdomen was assessed and showed an large, non-obstructing hiatal hernia with intrathoracic stomach dilatation, colon diverticulosis, peri-nephric stranding and free fluid in the pelvis. The patient was admitted per [facility]. He was also evaluated by GI and CCS who both feel findings are consistent with cholangitis and sepsis. The patient was volume resuscitated, treated with abx (aztreonam/vanco in ER, then to levaquin / flagyl), pan cultured and monitored on SDU. Unfortunately, the afternoon of 8/9, he deteriorated and required emergent  intubation with transfer to ICU.  STUDIES:/EVENTS 8/08 Admitted with abdominal pain, hypotension. 8/08 [place] ABD >> abnormal appearance of the gallbladder with contraction, Privott thickening and nodularity, no discrete stones, no other acute abnormality. CBD 8.5 mm 8/08 CT ABD/Pelvis >> gaseous distention of the stomach, large hiatal hernia, small high attenuation structure in the distal common bile duct (? Choledocholithiasis or small periampullary duodenum diverticulum), non-specific perinephric stranding, small volume free fluid within the pelvis  03/29/15: intubated. Moved to ICU 03/30/15: Not on pressors. Extubated. Repeated transient asymptomatic hypoglycemia per RN. He has denued abd pain to CCS and GI. CCS suspecting CBD stone with obstruction based on LFT but d/w DR Carlean Purl of GI - he reviewed CT with radiology - CBD stone/dilatation presence not convincing. GI Blood culture growing Gram Negativ Coccobacilli   Assessment & Plan    Principal Problem:  Severe Sepsis with Escherichia coli bacteremia: Unclear etiology possibly gallbladder versus bowel translocation - Continue IV ciprofloxacin currently stable, not spiking any fevers - ERCP was not successful on 8/15, gastroenterology planning repeat ERCP in a.m.  Active Problems:   atrial fibrillation with RVR: Currently normal sinus rhythm - Patient back in normal  sinus rhythm, appreciate cardiology recommendations, transitioned to oral Cardizem today. - Recommendations DC amlodipine outpatient, currently metoprolol on hold. - Continue to hold eliquis, restart eliquis when okay with GI and cardiology - Patient had an echo on 8/10, showed EF of 25-85%, grade 2 diastolic dysfunction    Acute hypercarbia respiratory failure - Extubated on 03/30/15, continue as needed albuterol  Acute on chronic kidney disease- Likely worsened due to ATN, new paraphimosis - Creatinine function plateaued at 3.6, nephrology following  Transaminitis with  transient biliary obstruction, cholecystitis, Escherichia coli bacteremia - Currently tolerating diet, general surgery, gastroenterology following - ERCP today  Anemia, thrombocytopenia -Platelets currently improving  Hypokalemia Currently stable   Code Status:Full code   Family Communication: Discussed in detail with the patient, all imaging results, lab results explained to the patient and patient's son on the phone   Disposition Plan:Not medically ready , no changes in the management today   Time Spent in minutes   20 minutes  Procedures   Mechanical ventilation Ultrasound abdomen CT abdomen  Consults   Cardiology  General surgery  GI  CCM  DVT Prophylaxis  SCD's  Medications  Scheduled Meds: . ciprofloxacin  400 mg Intravenous Q24H  . diltiazem  180 mg Oral Daily  . feeding supplement (ENSURE ENLIVE)  237 mL Oral BID BM  . levothyroxine  25 mcg Oral QAC breakfast  . pantoprazole  40 mg Oral Q0600  . pramipexole  1 mg Oral QHS  . sodium chloride  3 mL Intravenous Q12H   Continuous Infusions: . dextrose 5 % and 0.45% NaCl 75 mL/hr at 04/05/15 1009   PRN Meds:.albuterol, docusate sodium, metoprolol   Antibiotics   Anti-infectives    Start     Dose/Rate Route Frequency Ordered Stop   04/05/15 2200  ciprofloxacin (CIPRO) IVPB 400 mg     400 mg 200 mL/hr over 60 Minutes Intravenous Every 24 hours 04/05/15 1150     04/05/15 0000  ciprofloxacin (CIPRO) IVPB 400 mg  Status:  Discontinued     400 mg 200 mL/hr over 60 Minutes Intravenous Every 12 hours 04/04/15 1617 04/05/15 1150   03/31/15 1000  ciprofloxacin (CIPRO) IVPB 400 mg  Status:  Discontinued     400 mg 200 mL/hr over 60 Minutes Intravenous Every 24 hours 03/31/15 0933 04/04/15 1742   03/30/15 1400  vancomycin (VANCOCIN) IVPB 1000 mg/200 mL premix  Status:  Discontinued     1,000 mg 200 mL/hr over 60 Minutes Intravenous Every 48 hours 03/30/15 0751 03/31/15 0933   03/30/15 1300  levofloxacin  (LEVAQUIN) IVPB 500 mg  Status:  Discontinued     500 mg 100 mL/hr over 60 Minutes Intravenous Every 48 hours 03/28/15 1407 03/28/15 1645   03/30/15 1230  levofloxacin (LEVAQUIN) IVPB 500 mg  Status:  Discontinued     500 mg 100 mL/hr over 60 Minutes Intravenous Every 48 hours 03/28/15 1707 03/29/15 1619   03/29/15 1500  vancomycin (VANCOCIN) IVPB 750 mg/150 ml premix  Status:  Discontinued     750 mg 150 mL/hr over 60 Minutes Intravenous Every 24 hours 03/28/15 1407 03/28/15 1645   03/29/15 1400  vancomycin (VANCOCIN) IVPB 750 mg/150 ml premix  Status:  Discontinued     750 mg 150 mL/hr over 60 Minutes Intravenous Every 24 hours 03/28/15 1713 03/30/15 0750   03/29/15 1230  levofloxacin (LEVAQUIN) IVPB 750 mg  Status:  Discontinued     750 mg 100 mL/hr over 90 Minutes Intravenous  Once 03/28/15 1656  03/28/15 1707   03/28/15 2100  aztreonam (AZACTAM) 1 g in dextrose 5 % 50 mL IVPB  Status:  Discontinued     1 g 100 mL/hr over 30 Minutes Intravenous Every 8 hours 03/28/15 1407 03/28/15 1645   03/28/15 2100  aztreonam (AZACTAM) 1 g in dextrose 5 % 50 mL IVPB  Status:  Discontinued     1 g 100 mL/hr over 30 Minutes Intravenous 3 times per day 03/28/15 1713 03/31/15 0933   03/28/15 1700  levofloxacin (LEVAQUIN) IVPB 750 mg  Status:  Discontinued     750 mg 100 mL/hr over 90 Minutes Intravenous  Once 03/28/15 1645 03/28/15 1656   03/28/15 1700  metroNIDAZOLE (FLAGYL) IVPB 500 mg  Status:  Discontinued     500 mg 100 mL/hr over 60 Minutes Intravenous  Once 03/28/15 1645 03/28/15 1658   03/28/15 1700  metroNIDAZOLE (FLAGYL) IVPB 500 mg  Status:  Discontinued     500 mg 100 mL/hr over 60 Minutes Intravenous Every 8 hours 03/28/15 1658 03/31/15 0940   03/28/15 1245  vancomycin (VANCOCIN) 1,500 mg in sodium chloride 0.9 % 500 mL IVPB     1,500 mg 250 mL/hr over 120 Minutes Intravenous STAT 03/28/15 1235 03/28/15 1649   03/28/15 1230  levofloxacin (LEVAQUIN) IVPB 750 mg     750 mg 100 mL/hr  over 90 Minutes Intravenous  Once 03/28/15 1228 03/28/15 1440   03/28/15 1230  aztreonam (AZACTAM) 2 g in dextrose 5 % 50 mL IVPB     2 g 100 mL/hr over 30 Minutes Intravenous  Once 03/28/15 1228 03/28/15 1354   03/28/15 1230  vancomycin (VANCOCIN) IVPB 1000 mg/200 mL premix  Status:  Discontinued     1,000 mg 200 mL/hr over 60 Minutes Intravenous  Once 03/28/15 1228 03/28/15 1234        Subjective:   Shogo Larkey was seen and examined today. No complaints, no nausea, vomiting, chest pain, shortness of breath, in normal sinus rhythm. Afebrile. Patient denies dizziness, chest pain, abdominal pain, New weakness, numbess, tingling  Objective:   Blood pressure 105/52, pulse 64, temperature 97.8 F (36.6 C), temperature source Oral, resp. rate 18, height 5\' 11"  (1.803 m), weight 82.555 kg (182 lb), SpO2 97 %.  Wt Readings from Last 3 Encounters:  04/05/15 82.555 kg (182 lb)  02/28/15 72.213 kg (159 lb 3.2 oz)  01/26/15 73.936 kg (163 lb)     Intake/Output Summary (Last 24 hours) at 04/05/15 1316 Last data filed at 04/05/15 0804  Gross per 24 hour  Intake 1596.25 ml  Output    300 ml  Net 1296.25 ml    Exam  General: Alert and oriented x 3, NAD  HEENT:  PERRLA, EOMI,   Neck: Supple, no JVD,  CVS: S1 and S2 clear, RRR  Respiratory: CTAB  Abdomen: Soft, nt, nd, nbs  Ext: no cyanosis clubbing or edema  Neuro: no new deficits  Skin: No rashes  Psych: Normal affect and demeanor, alert and oriented x3    Data Review   Micro Results Recent Results (from the past 240 hour(s))  Blood Culture (routine x 2)     Status: None   Collection Time: 03/28/15 12:40 PM  Result Value Ref Range Status   Specimen Description BLOOD RIGHT ANTECUBITAL  Final   Special Requests BOTTLES DRAWN AEROBIC AND ANAEROBIC 5CC  Final   Culture  Setup Time   Final    GRAM NEGATIVE COCCOBACILLI IN BOTH AEROBIC AND ANAEROBIC BOTTLES CRITICAL RESULT  CALLED TO, READ BACK BY AND VERIFIED WITH: C  GOSS RN 2300 03/28/15 A BROWNING    Culture ESCHERICHIA COLI  Final   Report Status 03/30/2015 FINAL  Final   Organism ID, Bacteria ESCHERICHIA COLI  Final      Susceptibility   Escherichia coli - MIC*    AMPICILLIN >=32 RESISTANT Resistant     CEFAZOLIN 8 SENSITIVE Sensitive     CEFEPIME <=1 SENSITIVE Sensitive     CEFTAZIDIME <=1 SENSITIVE Sensitive     CEFTRIAXONE <=1 SENSITIVE Sensitive     CIPROFLOXACIN <=0.25 SENSITIVE Sensitive     GENTAMICIN <=1 SENSITIVE Sensitive     IMIPENEM <=0.25 SENSITIVE Sensitive     TRIMETH/SULFA <=20 SENSITIVE Sensitive     AMPICILLIN/SULBACTAM >=32 RESISTANT Resistant     PIP/TAZO 64 INTERMEDIATE Intermediate     * ESCHERICHIA COLI  Blood Culture (routine x 2)     Status: None   Collection Time: 03/28/15 12:54 PM  Result Value Ref Range Status   Specimen Description BLOOD RIGHT HAND  Final   Special Requests BOTTLES DRAWN AEROBIC AND ANAEROBIC 3CC  Final   Culture  Setup Time   Final    GRAM NEGATIVE RODS IN BOTH AEROBIC AND ANAEROBIC BOTTLES CRITICAL RESULT CALLED TO, READ BACK BY AND VERIFIED WITH: B LEAP,RN [id number] [place]    Culture   Final    ESCHERICHIA COLI SUSCEPTIBILITIES PERFORMED ON PREVIOUS CULTURE WITHIN THE LAST 5 DAYS.    Report Status 03/30/2015 FINAL  Final  Urine culture     Status: None   Collection Time: 03/28/15  1:00 PM  Result Value Ref Range Status   Specimen Description URINE, CATHETERIZED  Final   Special Requests NONE  Final   Culture NO GROWTH 2 DAYS  Final   Report Status 03/30/2015 FINAL  Final  MRSA PCR Screening     Status: None   Collection Time: 03/29/15  3:18 AM  Result Value Ref Range Status   MRSA by PCR NEGATIVE NEGATIVE Final    Comment:        The GeneXpert MRSA Assay (FDA approved for NASAL specimens only), is one component of a comprehensive MRSA colonization surveillance program. It is not intended to diagnose MRSA infection nor to guide or monitor treatment for MRSA infections.    Culture, respiratory (NON-Expectorated)     Status: None   Collection Time: 03/29/15  4:27 PM  Result Value Ref Range Status   Specimen Description TRACHEAL ASPIRATE  Final   Special Requests NONE  Final   Gram Stain   Final    FEW WBC PRESENT,BOTH PMN AND MONONUCLEAR RARE SQUAMOUS EPITHELIAL CELLS PRESENT RARE GRAM POSITIVE COCCI IN PAIRS Performed at [facility]    Culture   Final    Non-Pathogenic Oropharyngeal-type Flora Isolated. Performed at [facility]    Report Status 03/31/2015 FINAL  Final    Radiology Reports Ct Abdomen Pelvis Wo Contrast  03/28/2015   CLINICAL DATA:  81 year old male with generalized weakness, low blood sugar and epigastric pain  EXAM: CT ABDOMEN AND PELVIS WITHOUT CONTRAST  TECHNIQUE: Multidetector CT imaging of the abdomen and pelvis was performed following the standard protocol without IV contrast.  COMPARISON:  Abdominal ultrasound performed earlier today  FINDINGS: Lower Chest: Massive hiatal hernia. Ingested oral contrast material lays within the partially intra thoracic stomach which is significantly dilated with gas. The heart is within normal limits for size. Atherosclerotic calcifications present throughout the visualized coronary arteries. No  pericardial effusion. Chronic atelectasis in the bilateral lower lungs worse on the left than the right around the large hiatal hernia. Otherwise, the visualized lower lungs are essentially clear. Focal subpleural steatosis adjacent to the right middle lobe.  Abdomen: Unenhanced CT was performed per clinician order. Lack of IV contrast limits sensitivity and specificity, especially for evaluation of abdominal/pelvic solid viscera. Mild gaseous distension of the intra-abdominal portion of the stomach. Unremarkable CT appearance of the spleen, adrenal glands and pancreas. The gallbladder is contracted. There is a mild biliary ductal dilatation. A 6 x 8 mm high attenuation structure either within or  immediately adjacent to the distal common bile duct. It is unclear if this represents a small periampullary duodenum diverticulum or choledocholithiasis.  The liver is otherwise normal in size and contour. No definite liver lesion given the limitations of absent IV contrast. Nonspecific perinephric stranding bilaterally. No hydronephrosis. No nephrolithiasis. No free fluid or suspicious adenopathy. No evidence of a bowel obstruction or focal bowel Cueto thickening. Moderate volume of formed stool suggests constipation. Colonic diverticular disease without CT evidence of active inflammation.  Pelvis: The bladder is decompressed. Surgical changes of prior prostatectomy. Trace free fluid layers dependently within the pelvis.  Bones/Soft Tissues: No acute fracture or aggressive appearing lytic or blastic osseous lesion. Advanced multilevel degenerative disc disease. Small left fat containing inguinal hernia. Dextro convex scoliosis of the lumbar spine.  Vascular: Scattered atherosclerotic vascular calcifications without evidence of aneurysm. Otherwise, evaluation significantly limited in the absence of intravenous contrast.  IMPRESSION: 1. The patient's mid epigastric pain may be related to gaseous distension of the stomach including the large hiatal hernia and intra thoracic portion of the stomach. As the stomach is incompletely imaged, a component of acute or chronic gastric volvulus is difficult to exclude entirely although is clear there is not complete obstruction given passage of ingested oral contrast material into the distal bowel. 2. Small high attenuation structure in the region of the distal common bile duct may represent choledocholithiasis or a small periampullary duodenum diverticulum. A small duodenum diverticulum is favored. Recommend clinical correlation for evidence of a biliary obstruction or right upper quadrant pain. If clinically warranted, MRCP could further evaluate. 3. Nonspecific perinephric  stranding bilaterally. 4. Colonic diverticular disease without CT evidence of active inflammation. 5. Small volume free fluid layers within the pelvis. This is abnormal in a male patient has and is likely secondary to an underlying infectious or inflammatory process. 6. Advanced multilevel degenerative disc disease with dextro convex scoliosis. 7. Scattered atherosclerotic vascular calcifications.   Electronically Signed   By: Jacqulynn Cadet M.D.   On: 03/28/2015 20:15   US Abdomen Complete  03/28/2015   CLINICAL DATA:  Elevated liver function studies, elevated lactate level, epigastric pain  EXAM: ULTRASOUND ABDOMEN COMPLETE  COMPARISON:  Abdominal ultrasound of December 13, 2011  FINDINGS: Gallbladder: The gallbladder is contracted and its Beever is irregular. Stones have been described in the past but discrete stones are not observed today.  Common bile duct: Diameter: 8.5 mm  Liver: The liver exhibits no focal mass nor ductal dilation. The surface contour is normal.  IVC: No abnormality visualized.  Pancreas: The pancreas was obscured by bowel gas.  Spleen: Size and appearance within normal limits.  Right Kidney: Length: 10.5 cm. Echogenicity within normal limits. No mass or hydronephrosis visualized.  Left Kidney: Length: 10.5 cm. Echogenicity within normal limits. No mass or hydronephrosis visualized.  Abdominal aorta: No aneurysm visualized.  Other findings: No ascites is demonstrated.  IMPRESSION: 1. Abnormal appearance of the gallbladder with contraction an Ono thickening and nodularity. Stones may be present but are not discretely demonstrated. 2. No acute abnormality of the liver, spleen, or kidneys. The pancreas was obscured by bowel gas. 3. Given the patient's laboratory findings and chief complaint, further evaluation with abdominal and pelvic CT scanning would be useful.   Electronically Signed   By: David  Martinique M.D.   On: 03/28/2015 15:53   Mr Abdomen Mrcp Wo Cm  04/03/2015   CLINICAL DATA:   81 year old male with fever and abdominal pain. E coli bacteremia. Acute respiratory failure requiring intubation. Chronic renal dysfunction. Hyperbilirubinemia.  EXAM: MRI ABDOMEN WITHOUT CONTRAST  (INCLUDING MRCP)  TECHNIQUE: Multiplanar multisequence MR imaging of the abdomen was performed. Heavily T2-weighted images of the biliary and pancreatic ducts were obtained, and three-dimensional MRCP images were rendered by post processing.  COMPARISON:  CT of the abdomen and pelvis 03/28/2015.  FINDINGS: Comment: Study is limited for assessment of this visceral and/or vascular lesions by lack of IV gadolinium. Additionally, there is considerable patient respiratory motion.  Lower chest: Massive hiatal hernia. Small to moderate right pleural effusion.  Hepatobiliary: MRCP images are exceedingly limited by patient motion. With these limitations in mind, there is moderate to severe dilatation of the common bile duct and common hepatic duct. Common bile duct measures at least 15 mm in diameter. Mild intrahepatic biliary ductal dilatation is also noted. In the distal aspect of the common bile duct there is a small signal void, which corresponds to the suspected calcified stone on prior CT scan 03/28/2015. This likely represents a ductal stone. No discrete hepatic lesions are identified on today's limited examination.  Pancreas: No definite pancreatic mass. No pancreatic ductal dilatation. No peripancreatic fluid or inflammatory changes.  Spleen: Unremarkable.  Adrenals/Urinary Tract: Adrenal glands and kidneys are grossly normal in appearance on today's limited noncontrast examination.  Stomach/Bowel: Massive hiatal hernia with predominantly intrathoracic stomach, and containing a short segment of the transverse colon.  Vascular/Lymphatic: No definite aneurysm in the visualized abdominal arterial vasculature. No lymphadenopathy the visualized portions of the abdomen.  Other: Perinephric fluid bilaterally.  Musculoskeletal:  No aggressive osseous lesions noted in the visualized portions of the skeleton.  IMPRESSION: 1. Findings remain concerning for choledocholithiasis with probable stone in the distal common bile duct. This is associated with moderate to severe extrahepatic biliary ductal dilatation and mild intrahepatic biliary ductal dilatation. Correlation with ERCP is recommended, if clinically appropriate. 2. Limited examination with additional findings, as above.   Electronically Signed   By: Vinnie Langton M.D.   On: 04/03/2015 13:19   Mr 3d Recon At Scanner  04/03/2015   CLINICAL DATA:  81 year old male with fever and abdominal pain. E coli bacteremia. Acute respiratory failure requiring intubation. Chronic renal dysfunction. Hyperbilirubinemia.  EXAM: MRI ABDOMEN WITHOUT CONTRAST  (INCLUDING MRCP)  TECHNIQUE: Multiplanar multisequence MR imaging of the abdomen was performed. Heavily T2-weighted images of the biliary and pancreatic ducts were obtained, and three-dimensional MRCP images were rendered by post processing.  COMPARISON:  CT of the abdomen and pelvis 03/28/2015.  FINDINGS: Comment: Study is limited for assessment of this visceral and/or vascular lesions by lack of IV gadolinium. Additionally, there is considerable patient respiratory motion.  Lower chest: Massive hiatal hernia. Small to moderate right pleural effusion.  Hepatobiliary: MRCP images are exceedingly limited by patient motion. With these limitations in mind, there is moderate to severe dilatation of the common bile duct and common hepatic duct. Common  bile duct measures at least 15 mm in diameter. Mild intrahepatic biliary ductal dilatation is also noted. In the distal aspect of the common bile duct there is a small signal void, which corresponds to the suspected calcified stone on prior CT scan 03/28/2015. This likely represents a ductal stone. No discrete hepatic lesions are identified on today's limited examination.  Pancreas: No definite  pancreatic mass. No pancreatic ductal dilatation. No peripancreatic fluid or inflammatory changes.  Spleen: Unremarkable.  Adrenals/Urinary Tract: Adrenal glands and kidneys are grossly normal in appearance on today's limited noncontrast examination.  Stomach/Bowel: Massive hiatal hernia with predominantly intrathoracic stomach, and containing a short segment of the transverse colon.  Vascular/Lymphatic: No definite aneurysm in the visualized abdominal arterial vasculature. No lymphadenopathy the visualized portions of the abdomen.  Other: Perinephric fluid bilaterally.  Musculoskeletal: No aggressive osseous lesions noted in the visualized portions of the skeleton.  IMPRESSION: 1. Findings remain concerning for choledocholithiasis with probable stone in the distal common bile duct. This is associated with moderate to severe extrahepatic biliary ductal dilatation and mild intrahepatic biliary ductal dilatation. Correlation with ERCP is recommended, if clinically appropriate. 2. Limited examination with additional findings, as above.   Electronically Signed   By: Vinnie Langton M.D.   On: 04/03/2015 13:19   Dg Chest Port 1 View  03/30/2015   CLINICAL DATA:  Acute respiratory failure.  EXAM: PORTABLE CHEST - 1 VIEW  COMPARISON:  03/29/2015.  FINDINGS: 1116 hours. Endotracheal tube tip is 3.5 cm above the carina. Nasogastric tube projects below the diaphragm, tip not visualized. There is interval improved aeration of the lung bases with mild residual bibasilar atelectasis. No confluent airspace opacity, edema or significant pleural effusion. Cardiomegaly and a large hiatal hernia are again noted post CABG.  IMPRESSION: Interval improved aeration of the lung bases. Stable support system.   Electronically Signed   By: Richardean Sale M.D.   On: 03/30/2015 11:44   Dg Chest Port 1 View  03/29/2015   CLINICAL DATA:  Evaluate endotracheal tube position.  EXAM: PORTABLE CHEST - 1 VIEW  COMPARISON:  03/29/2015.   FINDINGS: Support apparatus: Endotracheal tube 3 cm from the carina. Enteric tube is present with the tip not visible. Side port is in the proximal gastric fundus. Monitoring leads project over the chest.  Cardiomediastinal Silhouette:  Unchanged.  Lungs: Bilateral basilar atelectasis, greater on the LEFT when compared to the RIGHT. No pneumothorax.  Effusions: RIGHT-greater-than-LEFT pleural effusions, likely partially loculated on the RIGHT.  Other:  Large hiatal hernia with intrathoracic gastric air bubble.  IMPRESSION: 1. Endotracheal tube tip 3 cm from the carina. 2. Nasogastric tube in the stomach. 3. Cardiomegaly with bilateral pleural effusions. 4. Large hiatal hernia.   Electronically Signed   By: Dereck Ligas M.D.   On: 03/29/2015 18:40   Dg Chest Port 1 View  03/29/2015   CLINICAL DATA:  Intubation  EXAM: PORTABLE CHEST - 1 VIEW  COMPARISON:  03/28/2015  FINDINGS: Endotracheal tube just above the carina. Recommend withdrawal of 3 cm.  Hypoventilation. Elevated left hemidiaphragm with left lower lobe atelectasis unchanged. Mild right lower lobe atelectasis and small bilateral effusions.  Improved aeration of the lungs compared with earlier today  IMPRESSION: Endotracheal tube at the carina, recommend withdrawal 3 cm.  Improved aeration in the lung bases. Persistent bibasilar atelectasis has improved. Small bilateral pleural effusions.   Electronically Signed   By: Franchot Gallo M.D.   On: 03/29/2015 14:59   Dg Chest [facility]  03/28/2015   CLINICAL DATA:  Shortness of Breath  EXAM: PORTABLE CHEST - 1 VIEW  COMPARISON:  Study obtained earlier in the day  FINDINGS: Large hiatal type hernia is again identified with most of the stomach above the diaphragm. The degree of inspiration shallow. There is no frank edema or consolidation. No appreciable joint effusions. Heart is mildly prominent with pulmonary vascularity within normal limits. Patient is status post coronary artery bypass grafting. No  adenopathy.  IMPRESSION: Large hiatal type hernia. No edema or consolidation. Degree of inspiration shallow. No new opacity. No change in cardiac silhouette.   Electronically Signed   By: Lowella Grip III M.D.   On: 03/28/2015 18:36   Dg Chest Portable 1 View  03/28/2015   CLINICAL DATA:  Shortness of breath today  EXAM: PORTABLE CHEST - 1 VIEW  COMPARISON:  03/27/2015  FINDINGS: Cardiac shadow is stable. A large hiatal hernia is again identified. Postsurgical changes are again seen. No focal infiltrate or sizable effusion is noted.  IMPRESSION: Stable large hiatal hernia.  No acute abnormality noted.   Electronically Signed   By: Inez Catalina M.D.   On: 03/28/2015 11:45   Dg Ercp  04/04/2015   CLINICAL DATA:  Jaundice.  EXAM: ERCP  TECHNIQUE: Multiple spot images obtained with the fluoroscopic device and submitted for interpretation post-procedure.  FLUOROSCOPY TIME:  Fluoroscopy Time:  16 minutes and 47 seconds  Number of Acquired Images:  1 image  COMPARISON:  MRI 04/02/2015  FINDINGS: Single ERCP image was obtained. Evidence for a wire in the pancreatic duct. No cannulation or opacification of the biliary system.  IMPRESSION: Common bile duct was not cannulated or opacified on this examination.  These images were submitted for radiologic interpretation only. Please see the procedural report for the amount of contrast and the fluoroscopy time utilized.   Electronically Signed   By: Markus Daft M.D.   On: 04/04/2015 17:43   Dg Abd Portable 1v  03/30/2015   CLINICAL DATA:  Abnormal LFT  EXAM: PORTABLE ABDOMEN - 1 VIEW  COMPARISON:  03/29/2015  FINDINGS: NG tube in the stomach. Normal bowel gas pattern. Contrast in the rectum from prior CT. Surgical clips in the pelvis.  Lumbar scoliosis and degenerative change.  No renal calculi.  IMPRESSION: Normal bowel gas pattern.  NG tube in the stomach.   Electronically Signed   By: Franchot Gallo M.D.   On: 03/30/2015 11:46   Dg Abd Portable 1v  03/29/2015    CLINICAL DATA:  Check NG tube position  EXAM: PORTABLE ABDOMEN - 1 VIEW  COMPARISON:  03/29/2015 at 1527 hours  FINDINGS: Enteric tube terminates below the diaphragm in the gastric body.  Nonobstructive bowel gas pattern.  Nodule contrast in the distal colon.  IMPRESSION: Enteric tube terminates below the diaphragm in the gastric body.   Electronically Signed   By: Julian Hy M.D.   On: 03/29/2015 16:55   Dg Abd Portable 1v  03/29/2015   CLINICAL DATA:  NG tube placement.  EXAM: PORTABLE ABDOMEN - 1 VIEW  COMPARISON:  CT scan 03/28/2015.  FINDINGS: The NG tube tip is in the lower aspect of the chest. This is likely in the distal esophagus or possibly the large hiatal hernia. The proximal port is in the mid esophagus.  The stomach demonstrates gaseous distention. There is contrast in the bowel.  IMPRESSION: The NG tube tip is in the distal esophagus or large hiatal hernia.   Electronically Signed   By:  Marijo Sanes M.D.   On: 03/29/2015 15:55   US Abdomen Limited Ruq  03/31/2015   CLINICAL DATA:  Questionable choledocholithiasis on recent CT  EXAM: US ABDOMEN LIMITED - RIGHT UPPER QUADRANT  COMPARISON:  Abdominal ultrasound and CT abdomen and pelvis March 28, 2015  FINDINGS: Gallbladder:  The gallbladder is somewhat contracted. There are nonshadowing echogenic foci in the gallbladder similar to 1 day prior. The gallbladder Dia is upper normal in thickness. There is no pericholecystic fluid. No sonographic Murphy sign noted.  Common bile duct:  Diameter: 6 mm. There is no mass or calculus seen in the visualized portions of the common bile duct. Portions of the common bile duct are obscured by gas.  Liver:  No focal lesion identified. Within normal limits in parenchymal echogenicity.  IMPRESSION: Note that the study is less than optimal due to patient's inability to suspend respiration significantly. No choledocholithiasis is seen by ultrasound. Portions of the common bile duct, however, are not well  visualized due to the patient's inability to suspend respiration and overlying gas. There is upper normal gallbladder Barillas thickness with several non shadowing echogenic foci in the gallbladder. Question non shadowing gallstones versus small foci of sludge.  MRCP would be the optimal study to further evaluate the common bile duct. If patient becomes able to suspend respiration for a significant period of time, consideration may wish to be given to MRCP for further assessment.   Electronically Signed   By: Lowella Grip III M.D.   On: 03/31/2015 02:25    CBC  Recent Labs Lab 03/31/15 0400 04/01/15 0227 04/02/15 0553 04/03/15 0509 04/04/15 0338 04/05/15 0439  WBC 17.7* 13.7* 10.5 12.4* 15.1* 15.3*  HGB 9.9* 10.2* 9.8* 9.8* 9.5* 8.5*  HCT 28.1* 28.4* 27.8* 27.4* 26.5* 23.9*  PLT 94* 86* 94* 109* 140* 147*  MCV 88.9 88.2 86.9 88.1 87.2 88.8  MCH 31.3 31.7 30.6 31.5 31.3 31.6  MCHC 35.2 35.9 35.3 35.8 35.8 35.6  RDW 14.1 14.3 14.4 14.6 14.5 14.8  LYMPHSABS 0.4*  --  0.9 0.6* 1.2 0.9  MONOABS 0.9  --  0.9 1.6* 0.9 0.9  EOSABS 0.0  --  0.2 0.4 0.3 0.5  BASOSABS 0.0  --  0.0 0.0 0.0 0.0    Chemistries   Recent Labs Lab 03/30/15 0236  03/31/15 0400  03/31/15 1845 04/01/15 0227 04/02/15 0553 04/02/15 1530 04/03/15 0509 04/03/15 1427 04/04/15 0338 04/05/15 0439  NA 131*  < > 132*  --  130* 133* 132* 131* 134*  --  133* 130*  K 3.8  < > 3.3*  --  4.7 3.4* 3.6 3.5 3.7  --  3.3* 3.8  CL 103  < > 105  --  103 107 106 102 105  --  103 101  CO2 15*  < > 17*  --  14* 18* 18* 20* 17*  --  21* 19*  GLUCOSE 38*  < > 100*  --  103* 84 95 122* 77  --  102* 56*  BUN 49*  < > 50*  --  50* 46* 52* 54* 58*  --  62* 60*  CREATININE 2.78*  < > 3.35*  --  3.46* 3.61* 3.72* 3.68* 3.63*  --  3.67* 3.63*  CALCIUM 7.4*  < > 7.8*  --  8.1* 8.4* 8.7* 8.9 8.6*  --  8.8* 8.2*  MG 1.5*  --  2.1  --  2.1  --   --   --   --   --   --   --  AST 129*  135*  < >  --   < >  --  41 47*  --   --  79* 84*  68*  ALT 234*  248*  < >  --   < >  --  135* 101*  --   --  89* 81* 71*  ALKPHOS 110  109  < >  --   < >  --  210* 213*  --   --  256* 257* 263*  BILITOT 6.2*  6.6*  < >  --   < >  --  8.2* 10.6*  --   --  15.7* 16.3* 14.0*  < > = values in this interval not displayed. ------------------------------------------------------------------------------------------------------------------ estimated creatinine clearance is 15.6 mL/min (by C-G formula based on Cr of 3.63). ------------------------------------------------------------------------------------------------------------------ No results for input(s): HGBA1C in the last 72 hours. ------------------------------------------------------------------------------------------------------------------ No results for input(s): CHOL, HDL, LDLCALC, TRIG, CHOLHDL, LDLDIRECT in the last 72 hours. ------------------------------------------------------------------------------------------------------------------ No results for input(s): TSH, T4TOTAL, T3FREE, THYROIDAB in the last 72 hours.  Invalid input(s): FREET3 ------------------------------------------------------------------------------------------------------------------ No results for input(s): VITAMINB12, FOLATE, FERRITIN, TIBC, IRON, RETICCTPCT in the last 72 hours.  Coagulation profile  Recent Labs Lab 03/30/15 1127 04/02/15 0553  INR 1.86* 1.37    No results for input(s): DDIMER in the last 72 hours.  Cardiac Enzymes  Recent Labs Lab 04/02/15 0910 04/02/15 1530 04/02/15 2035  TROPONINI 0.58* 0.07* 0.06*   ------------------------------------------------------------------------------------------------------------------ Invalid input(s): POCBNP  No results for input(s): GLUCAP in the last 72 hours.   Viktor Philipp M.D. [facility] 04/05/2015, 1:16 PM  Pager: [phone] Between 7am to 7pm - call Pager - [phone]  After 7pm go to [email or web address] - password  [email or web address]  Call night coverage person covering after 7pm

## 2015-04-05 NOTE — Care Management Note (Signed)
Case Management Note  Patient Details  Name: [name] MRN: [id number] Date of Birth: [birth year]  Subjective/Objective:  Pt admitted with fevers and hypotension found to have E coli sepsis possibly sec to cholecystitis                  Action/Plan: PT did work with pt and in regards to disposition needs, PT recommends SNF. CSW to assist with disposition needs. CM will continue to monitor.    Expected Discharge Date:                  Expected Discharge Plan:  Skilled Nursing Facility  In-House Referral:  Clinical Social Work  Discharge planning Services  CM Consult  Post Acute Care Choice:  Durable Medical Equipment, Home Health Choice offered to:  Patient, Adult Children  DME Arranged:    DME Agency:     HH Arranged:    [place] Agency:     Status of Service:  In process, will continue to follow  Medicare Important Message Given:  Yes-third notification given Date Medicare IM Given:    Medicare IM give by:    Date Additional Medicare IM Given:    Additional Medicare Important Message give by:     If discussed at [place] of Stay Meetings, dates discussed:    Additional Comments:  Bethena Roys, RN 04/05/2015, 4:24 PM

## 2015-04-05 NOTE — Anesthesia Postprocedure Evaluation (Signed)
  Anesthesia Post-op Note  Patient: [name]  Procedure(s) Performed: Procedure(s): ENDOSCOPIC RETROGRADE CHOLANGIOPANCREATOGRAPHY (ERCP) (N/A)  Patient Location: PACU  Anesthesia Type:General  Level of Consciousness: awake  Airway and Oxygen Therapy: Patient Spontanous Breathing  Post-op Pain: mild  Post-op Assessment: Post-op Vital signs reviewed LLE Motor Response: Purposeful movement   RLE Motor Response: Purposeful movement        Post-op Vital Signs: Reviewed  Last Vitals:  Filed Vitals:   04/05/15 0819  BP:   Pulse:   Temp: 36.6 C  Resp:     Complications: No apparent anesthesia complications

## 2015-04-05 NOTE — Progress Notes (Signed)
Assessment:  1. AKI likely sec ischemic ATN in face of ACE. Has baseline Crt of 1-1.2, now plateuing around 3.6. UO good. 2. E Coli bacteremia 3. A fib with RVR - on cardizem. 4 cholecystitis with possible cholodocholithiasis - on cipro.   Plan: 1. Anticipate renal fx to recover as he recovers--supportive tx - agree with 75cc/hr fluids.  2. Surgery rec outpatient elective cholecystectomy.  For ERCP in AM.   Subjective: Interval History: No new complaints. Ab pain is better.   Objective: Vital signs in last 24 hours: Temp:  [97.1 F (36.2 C)-97.8 F (36.6 C)] 97.8 F (36.6 C) (08/16 0819) Pulse Rate:  [59-66] 64 (08/16 0400) Resp:  [10-29] 18 (08/16 0400) BP: (84-114)/(49-73) 105/52 mmHg (08/16 0400) SpO2:  [94 %-100 %] 97 % (08/16 0400) Weight:  [182 lb (82.555 kg)] 182 lb (82.555 kg) (08/16 0400) Weight change: 5 lb (2.268 kg)  Intake/Output from previous day: 08/15 0701 - 08/16 0700 In: 2096.3 [I.V.:1596.3; IV Piggyback:500] Out: 400 [Urine:400] Intake/Output this shift: Total I/O In: -  Out: 100 [Urine:100]  General appearance: alert and cooperative Head: Normocephalic, without obvious abnormality, atraumatic Resp: clear to auscultation bilaterally Chest Zingg: no tenderness GI: mild RUQ tender Extremities: extremities normal, atraumatic, no cyanosis or edema  Lab Results:  Recent Labs  04/04/15 0338 04/05/15 0439  WBC 15.1* 15.3*  HGB 9.5* 8.5*  HCT 26.5* 23.9*  PLT 140* 147*   BMET:   Recent Labs  04/04/15 0338 04/05/15 0439  NA 133* 130*  K 3.3* 3.8  CL 103 101  CO2 21* 19*  GLUCOSE 102* 56*  BUN 62* 60*  CREATININE 3.67* 3.63*  CALCIUM 8.8* 8.2*   No results for input(s): PTH in the last 72 hours. Iron Studies: No results for input(s): IRON, TIBC, TRANSFERRIN, FERRITIN in the last 72 hours. Studies/Results: Dg Ercp  04/04/2015   CLINICAL DATA:  Jaundice.  EXAM: ERCP  TECHNIQUE: Multiple spot images obtained with the fluoroscopic device  and submitted for interpretation post-procedure.  FLUOROSCOPY TIME:  Fluoroscopy Time:  16 minutes and 47 seconds  Number of Acquired Images:  1 image  COMPARISON:  MRI 04/02/2015  FINDINGS: Single ERCP image was obtained. Evidence for a wire in the pancreatic duct. No cannulation or opacification of the biliary system.  IMPRESSION: Common bile duct was not cannulated or opacified on this examination.  These images were submitted for radiologic interpretation only. Please see the procedural report for the amount of contrast and the fluoroscopy time utilized.   Electronically Signed   By: Markus Daft M.D.   On: 04/04/2015 17:43    Scheduled: . ciprofloxacin  400 mg Intravenous Q24H  . diltiazem  180 mg Oral Daily  . feeding supplement (ENSURE ENLIVE)  237 mL Oral BID BM  . levothyroxine  25 mcg Oral QAC breakfast  . pantoprazole  40 mg Oral Q0600  . pramipexole  1 mg Oral QHS  . sodium chloride  3 mL Intravenous Q12H     LOS: 8 days   Ahmed, Tasrif 04/05/2015,2:48 PM  Renal Attending; Agree with note above.  Renal fct stable and awaiting improvement. Consuello Lassalle C

## 2015-04-05 NOTE — Clinical Social Work Placement (Signed)
   CLINICAL SOCIAL WORK PLACEMENT  NOTE  Date:  04/05/2015  Patient Details  Name: XSAVIER SEELEY MRN: [id number] Date of Birth: Dec 13, 1926  Clinical Social Work is seeking post-discharge placement for this patient at the [place] level of care (*CSW will initial, date and re-position this form in  chart as items are completed):  Yes   Patient/family provided with [place] Work Department's list of facilities offering this level of care within the geographic area requested by the patient (or if unable, by the patient's family).  Yes   Patient/family informed of their freedom to choose among providers that offer the needed level of care, that participate in Medicare, Medicaid or managed care program needed by the patient, have an available bed and are willing to accept the patient.  Yes   Patient/family informed of [facility]'s ownership interest in [facility] and [facility], as well as of the fact that they are under no obligation to receive care at these facilities.  PASRR submitted to EDS on       PASRR number received on       Existing PASRR number confirmed on 04/05/15     FL2 transmitted to all facilities in geographic area requested by pt/family on 04/05/15     FL2 transmitted to all facilities within larger geographic area on       Patient informed that his/her managed care company has contracts with or will negotiate with certain facilities, including the following:            Patient/family informed of bed offers received.  Patient chooses bed at       Physician recommends and patient chooses bed at      Patient to be transferred to   on  .  Patient to be transferred to facility by       Patient family notified on   of transfer.  Name of family member notified:        PHYSICIAN Please sign FL2, Please prepare priority discharge summary, including medications, Please prepare prescriptions     Additional Comment:     _______________________________________________ Berton Mount, [place]

## 2015-04-05 NOTE — Progress Notes (Signed)
Daily Rounding Note  04/05/2015, 8:42 AM  LOS: 8 days   SUBJECTIVE:       Says he feels the best he has felt since he arrived.   RN notes urinary incontinence so urine output not reliable.   No BM yesterday or today.  He wouldn't mind getting prn stool softener. No abdominal pain.  No chest pain or dyspnea.  Tolerating clears  OBJECTIVE:         Vital signs in last 24 hours:    Temp:  [97.1 F (36.2 C)-97.8 F (36.6 C)] 97.8 F (36.6 C) (08/16 0819) Pulse Rate:  [59-66] 64 (08/16 0400) Resp:  [10-29] 18 (08/16 0400) BP: (84-149)/(49-73) 105/52 mmHg (08/16 0400) SpO2:  [94 %-100 %] 97 % (08/16 0400) Weight:  [182 lb (82.555 kg)] 182 lb (82.555 kg) (08/16 0400) Last BM Date: 04/03/15 Filed Weights   04/03/15 0538 04/04/15 0635 04/05/15 0400  Weight: 176 lb 11.2 oz (80.151 kg) 177 lb (80.287 kg) 182 lb (82.555 kg)   General: still looks weak   Heart: RRR, rate in 70s on monitor Chest: some crackles in bases, worse on right.  No cough, no dyspnea Abdomen: soft, NT. ND.  Active BS  Extremities: no CCE Neuro/Psych:  Oriented x 3.  Moves all 4s.  No tremors or gross weakness.   Intake/Output from previous day: 08/15 0701 - 08/16 0700 In: 2096.3 [I.V.:1596.3; IV Piggyback:500] Out: 400 [Urine:400]  Intake/Output this shift: Total I/O In: -  Out: 100 [Urine:100]  Lab Results:  Recent Labs  04/03/15 0509 04/04/15 0338 04/05/15 0439  WBC 12.4* 15.1* 15.3*  HGB 9.8* 9.5* 8.5*  HCT 27.4* 26.5* 23.9*  PLT 109* 140* 147*   BMET  Recent Labs  04/03/15 0509 04/04/15 0338 04/05/15 0439  NA 134* 133* 130*  K 3.7 3.3* 3.8  CL 105 103 101  CO2 17* 21* 19*  GLUCOSE 77 102* 56*  BUN 58* 62* 60*  CREATININE 3.63* 3.67* 3.63*  CALCIUM 8.6* 8.8* 8.2*   LFT  Recent Labs  04/03/15 1427 04/04/15 0338 04/05/15 0439  PROT 5.2* 4.9* 4.7*  ALBUMIN 2.2* 2.0* 1.9*  AST 79* 84* 68*  ALT 89* 81* 71*  ALKPHOS  256* 257* 263*  BILITOT 15.7* 16.3* 14.0*  BILIDIR 12.8*  --   --   IBILI 2.9*  --   --    PT/INR No results for input(s): LABPROT, INR in the last 72 hours. Hepatitis Panel No results for input(s): HEPBSAG, HCVAB, HEPAIGM, HEPBIGM in the last 72 hours.  Studies/Results: Dg Ercp  04/04/2015   CLINICAL DATA:  Jaundice.  EXAM: ERCP  TECHNIQUE: Multiple spot images obtained with the fluoroscopic device and submitted for interpretation post-procedure.  FLUOROSCOPY TIME:  Fluoroscopy Time:  16 minutes and 47 seconds  Number of Acquired Images:  1 image  COMPARISON:  MRI 04/02/2015  FINDINGS: Single ERCP image was obtained. Evidence for a wire in the pancreatic duct. No cannulation or opacification of the biliary system.  IMPRESSION: Common bile duct was not cannulated or opacified on this examination.  These images were submitted for radiologic interpretation only. Please see the procedural report for the amount of contrast and the fluoroscopy time utilized.   Electronically Signed   By: Markus Daft M.D.   On: 04/04/2015 17:43   Scheduled Meds: . ciprofloxacin  400 mg Intravenous Q12H  . diltiazem  180 mg Oral Daily  . feeding supplement (ENSURE ENLIVE)  237 mL Oral BID BM  . levothyroxine  25 mcg Oral QAC breakfast  . pantoprazole  40 mg Oral Q0600  . pramipexole  1 mg Oral QHS  . sodium chloride  3 mL Intravenous Q12H   Continuous Infusions: . sodium chloride 75 mL/hr at 04/04/15 1023  . dextrose 5 % and 0.45% NaCl 10 mL/hr at 04/01/15 1100   PRN Meds:.albuterol, metoprolol   ASSESMENT:   * Jaundice. Suspect CBD stone.  Grew Ecoli from blood. Day 9 abx.  unsuccesful ERCP 8/17 due to peri-ampullary diverticulum, unable to cannulate bile or pancreatic duct.  T bili, transaminases improving.  Alk phos stable.     * Large intrathoracic HH.   * Acute thrombocytopenia. stable, non-critical.   * AKI. 1.3 litres urine output yesterday, 100cc so far today.  Just 400 cc urine  recorded yesterday but with incontinence this is certainly inaccurate.  IVF: NS at 75/hour.   * PAF. On po diltiazem. Chronic Eliquis on hold.   * Acute resp failure requiring 24 hours on vent last week.    *  Normocytic anemia.   *   Deconditioning.    *  Protein malnutrition, low albumin. Enlive clear supplement in place.    PLAN   *  Repeat ERCP attempt 8/17. At 12:30.  Note that PT and OT following.   *  Advance to [facility] diet.   *  Colace for constipation.  Change IVF to d5/1/2 NS at 75.    Azucena Freed  04/05/2015, 8:42 AM Pager: [phone]

## 2015-04-05 NOTE — Clinical Social Work Note (Signed)
Clinical Social Work Assessment  Patient Details  Name: [name] MRN: [id number] Date of Birth: Sep 29, 1926  Date of referral:  04/05/15               Reason for consult:  Facility Placement                Permission sought to share information with:  Facility Art therapist granted to share information::  Yes, Verbal Permission Granted  Name::        Agency::  SNFs for referral purposes  Relationship::     Contact Information:     Housing/Transportation Living arrangements for the past 2 months:  Single Family Home Source of Information:  Patient Patient Interpreter Needed:  None Criminal Activity/Legal Involvement Pertinent to Current Situation/Hospitalization:  No - Comment as needed Significant Relationships:  Adult Children Lives with:  Self Do you feel safe going back to the place where you live?  No Need for family participation in patient care:  No (Coment)  Care giving concerns:  Pt needing assistance with ambulation   Social Worker assessment / plan:  CSW visited pt room to discuss recommendation for ST rehab. Pt informed CSW he is aware of recommendation and agreeable to SNF. Pt informed CSW he has been to SNF before and would prefer to dc to [facility] this admission. CSW explained SNF referral process. Pt agreeable to referral being sent to [facility], [facility], and [facility]. Pt aware that insurance authorization is needed for SNF dc.  CSW called [facility] and notified of referral and pt preference for facility. CSW asked facility to review referral as soon as possible and if they are able to accept pt to begin insurance authorization request.  Employment status:  Retired Nurse, adult PT Recommendations:  [place] / Referral to community resources:  [place]  Patient/Family's Response to care: Pt agreeable to plan and  understanding of need for rehab  Patient/Family's Understanding of and Emotional Response to Diagnosis, Current Treatment, and Prognosis: Pt has good understanding of diagnosis. Pt with appropriate emotional response.  Emotional Assessment Appearance:  Appears stated age Attitude/Demeanor/Rapport:  Other (Cooperative) Affect (typically observed):  Calm, Appropriate Orientation:  Oriented to Self, Oriented to Place, Oriented to  Time, Oriented to Situation Alcohol / Substance use:  Not Applicable Psych involvement (Current and /or in the community):  No (Comment)  Discharge Needs  Concerns to be addressed:  Discharge Planning Concerns Readmission within the last 30 days:  No Current discharge risk:  Dependent with Mobility Barriers to Discharge:  Continued Medical Work up   [employer], [place]

## 2015-04-05 NOTE — Progress Notes (Signed)
Patient ID: [name], male   DOB: [birth year], 81 y.o.   MRN: [id number]    Subjective:  Some abdominal pain   Objective:  Filed Vitals:   04/05/15 0000 04/05/15 0004 04/05/15 0400 04/05/15 0819  BP:  96/54 105/52   Pulse:  59 64   Temp: 97.5 F (36.4 C)  97.3 F (36.3 C) 97.8 F (36.6 C)  TempSrc: Oral  Oral Oral  Resp:  11 18   Height:      Weight:   82.555 kg (182 lb)   SpO2:  96% 97%     Intake/Output from previous day:  Intake/Output Summary (Last 24 hours) at 04/05/15 0830 Last data filed at 04/05/15 5462  Gross per 24 hour  Intake 2096.25 ml  Output    300 ml  Net 1796.25 ml    Physical Exam: Affect appropriate Jaundice elderly white nmale  HEENT: normal Neck supple with no adenopathy JVP normal no bruits no thyromegaly Lungs clear with no wheezing and good diaphragmatic motion Heart:  S1/S2 AS murmur, no rub, gallop or click PMI normal Abdomen: benighn, BS positve, no tenderness, no AAA no bruit.  No HSM or HJR Distal pulses intact with no bruits No edema Neuro non-focal Skin warm and dry No muscular weakness   Lab Results: Basic Metabolic Panel:  Recent Labs  04/04/15 0338 04/05/15 0439  NA 133* 130*  K 3.3* 3.8  CL 103 101  CO2 21* 19*  GLUCOSE 102* 56*  BUN 62* 60*  CREATININE 3.67* 3.63*  CALCIUM 8.8* 8.2*   Liver Function Tests:  Recent Labs  04/04/15 0338 04/05/15 0439  AST 84* 68*  ALT 81* 71*  ALKPHOS 257* 263*  BILITOT 16.3* 14.0*  PROT 4.9* 4.7*  ALBUMIN 2.0* 1.9*   CBC:  Recent Labs  04/04/15 0338 04/05/15 0439  WBC 15.1* 15.3*  NEUTROABS 12.7* 13.0*  HGB 9.5* 8.5*  HCT 26.5* 23.9*  MCV 87.2 88.8  PLT 140* 147*   Cardiac Enzymes:  Recent Labs  04/02/15 0910 04/02/15 1530 04/02/15 2035  TROPONINI 0.58* 0.07* 0.06*    Imaging: Dg Ercp  04/04/2015   CLINICAL DATA:  Jaundice.  EXAM: ERCP  TECHNIQUE: Multiple spot images obtained with the fluoroscopic device and submitted for interpretation  post-procedure.  FLUOROSCOPY TIME:  Fluoroscopy Time:  16 minutes and 47 seconds  Number of Acquired Images:  1 image  COMPARISON:  MRI 04/02/2015  FINDINGS: Single ERCP image was obtained. Evidence for a wire in the pancreatic duct. No cannulation or opacification of the biliary system.  IMPRESSION: Common bile duct was not cannulated or opacified on this examination.  These images were submitted for radiologic interpretation only. Please see the procedural report for the amount of contrast and the fluoroscopy time utilized.   Electronically Signed   By: Markus Daft M.D.   On: 04/04/2015 17:43    Cardiac Studies:  ECG: 04/02/15  SR rate 77 first degree LAD    Telemetry:  NSR PVC  Has converted from PAF  04/05/2015   Echo:  03/25/15  EF 55-60%  AV sclerosis not stenosis   Medications:   . ciprofloxacin  400 mg Intravenous Q12H  . diltiazem  180 mg Oral Daily  . feeding supplement (ENSURE ENLIVE)  237 mL Oral BID BM  . levothyroxine  25 mcg Oral QAC breakfast  . pantoprazole  40 mg Oral Q0600  . pramipexole  1 mg Oral QHS  . sodium chloride  3 mL Intravenous Q12H     .  sodium chloride 75 mL/hr at 04/04/15 1023  . dextrose 5 % and 0.45% NaCl 10 mL/hr at 04/01/15 1100    Assessment/Plan:  PAF:  On PO cardizem now per Dr Percival Spanish no anticoagulation given acute abdominal process Elevated Troponin:  Back down no chest pain no plans for invasive evalaution Cholycystitis:  With ecoli bacteremia.  On cipro failed ERCP ? Plans to repeat in 2 days  Cardiac status stable call if needed   Jenkins Rouge 04/05/2015, 8:30 AM

## 2015-04-05 NOTE — Progress Notes (Signed)
OT Cancellation Note  Patient Details Name: [name] MRN: [id number] DOB: April 03, 1927   Cancelled Treatment:    Reason Eval/Treat Not Completed: Fatigue/lethargy limiting ability to participate -- patient declines OT due to fatigue from PT and from sitting up in chair for several hours. Will continue OT per plan of care.  [name] A 04/05/2015, 2:18 PM

## 2015-04-05 NOTE — Progress Notes (Signed)
Physical Therapy Treatment Patient Details Name: [name] MRN: [id number] DOB: Dec 19, 1926 Today's Date: 04/05/2015    History of Present Illness Patient is an 81 y/o male admitted with fevers and hypotension found to have E coli sepsis possibly sec to cholecystitis. Intubated 8/9-8/11/16.  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  He had an unsuccessful ERCP on 8/15 and is planned for a repeat ERCP on 8/17.       PT Comments    Pt is generally weak and deconditioned.  Only able to tolerate in room ambulation due to weak and shaky legs.  Less DOE with session today on RA.  VSS throughout.  Pt is appropriate for SNF level rehab at discharge.  He will need constant hands on assist when up and mobilizing to be safe.    Follow Up Recommendations  SNF     Equipment Recommendations  None recommended by PT    Recommendations for Other Services   NA     Precautions / Restrictions Precautions Precautions: Fall Precaution Comments: generally weak and deconditioned    Mobility  Bed Mobility Overal bed mobility: Needs Assistance Bed Mobility: Supine to Sit     Supine to sit: Supervision     General bed mobility comments: supervision for safety due to significant increased time to get EOB and heavy use of bed rail for leverage.   Transfers Overall transfer level: Needs assistance Equipment used: Rolling walker (2 wheeled) Transfers: Sit to/from Stand Sit to Stand: Min assist;From elevated surface         General transfer comment: Min assist to support trunk from elevated bed.   Ambulation/Gait Ambulation/Gait assistance: Min assist Ambulation Distance (Feet): 15 Feet Assistive device: Rolling walker (2 wheeled) Gait Pattern/deviations: Step-through pattern;Shuffle (over weak and shaky  knees)     General Gait Details: pt with slow, shuffling gait pattern, legs visibly weak.  Verbal cues for safe use of RW and RW adjusted up for pt's tall stature.         Balance Overall balance assessment: Needs assistance Sitting-balance support: Feet supported;Bilateral upper extremity supported Sitting balance-Leahy Scale: Fair     Standing balance support: Bilateral upper extremity supported Standing balance-Leahy Scale: Poor                      Cognition Arousal/Alertness: Awake/alert Behavior During Therapy: WFL for tasks assessed/performed Overall Cognitive Status: Within Functional Limits for tasks assessed                      Exercises General Exercises - Upper Extremity Shoulder Flexion: AROM;Both;10 reps;Seated General Exercises - Lower Extremity Long Arc Quad: AROM;Both;10 reps;Seated Hip ABduction/ADduction: AROM;Both;10 reps;Seated Hip Flexion/Marching: AROM;Both;10 reps;Seated Toe Raises: AROM;10 reps;Seated Heel Raises: AROM;Both;10 reps;Seated Other Exercises Other Exercises: chair push ups x 5 reps        Pertinent Vitals/Pain Pain Assessment: No/denies pain           PT Goals (current goals can now be found in the care plan section) Acute Rehab PT Goals Patient Stated Goal: to get stronger  Progress towards PT goals: Progressing toward goals    Frequency  Min 3X/week    PT Plan Current plan remains appropriate       End of Session Equipment Utilized  During Treatment: Gait belt Activity Tolerance: Patient limited by fatigue Patient left: in chair;with call bell/phone within reach;with chair alarm set;with family/visitor present     Time: 3276-1470 PT Time Calculation (min) (ACUTE ONLY): 33 min  Charges:  $Therapeutic Exercise: 8-22 mins $Therapeutic Activity: 8-22 mins                      Braycen Burandt B. Charly Hunton, PT, DPT [phone]   04/05/2015, 10:50 AM

## 2015-04-06 ENCOUNTER — Encounter (HOSPITAL_COMMUNITY)
Admission: EM | Disposition: A | Discharge status: Skilled Nursing Facility | Payer: Self-pay | Source: Home / Self Care | Attending: Internal Medicine

## 2015-04-06 ENCOUNTER — Inpatient Hospital Stay (HOSPITAL_COMMUNITY): Payer: Medicare PPO | Admitting: Anesthesiology

## 2015-04-06 ENCOUNTER — Inpatient Hospital Stay (HOSPITAL_COMMUNITY): Payer: Medicare PPO

## 2015-04-06 ENCOUNTER — Encounter (HOSPITAL_COMMUNITY): Payer: Self-pay | Admitting: Anesthesiology

## 2015-04-06 DIAGNOSIS — R748 Abnormal levels of other serum enzymes: Secondary | ICD-10-CM

## 2015-04-06 DIAGNOSIS — Z4659 Encounter for fitting and adjustment of other gastrointestinal appliance and device: Secondary | ICD-10-CM | POA: Insufficient documentation

## 2015-04-06 DIAGNOSIS — R0902 Hypoxemia: Secondary | ICD-10-CM | POA: Insufficient documentation

## 2015-04-06 DIAGNOSIS — K8050 Calculus of bile duct without cholangitis or cholecystitis without obstruction: Secondary | ICD-10-CM

## 2015-04-06 DIAGNOSIS — J9600 Acute respiratory failure, unspecified whether with hypoxia or hypercapnia: Secondary | ICD-10-CM

## 2015-04-06 DIAGNOSIS — R1013 Epigastric pain: Secondary | ICD-10-CM

## 2015-04-06 HISTORY — PX: ERCP: SHX5425

## 2015-04-06 LAB — COMPREHENSIVE METABOLIC PANEL
ALBUMIN: 2 g/dL — AB (ref 3.5–5.0)
ALK PHOS: 234 U/L — AB (ref 38–126)
ALT: 59 U/L (ref 17–63)
AST: 43 U/L — AB (ref 15–41)
Anion gap: 6 (ref 5–15)
BILIRUBIN TOTAL: 6.3 mg/dL — AB (ref 0.3–1.2)
BUN: 61 mg/dL — AB (ref 6–20)
CALCIUM: 8.1 mg/dL — AB (ref 8.9–10.3)
CO2: 22 mmol/L (ref 22–32)
Chloride: 103 mmol/L (ref 101–111)
Creatinine, Ser: 3.68 mg/dL — ABNORMAL HIGH (ref 0.61–1.24)
GFR calc Af Amer: 16 mL/min — ABNORMAL LOW (ref >60)
GFR calc non Af Amer: 14 mL/min — ABNORMAL LOW (ref >60)
GLUCOSE: 105 mg/dL — AB (ref 65–99)
POTASSIUM: 3.8 mmol/L (ref 3.5–5.1)
Sodium: 131 mmol/L — ABNORMAL LOW (ref 135–145)
TOTAL PROTEIN: 4.8 g/dL — AB (ref 6.5–8.1)

## 2015-04-06 LAB — PREALBUMIN: Prealbumin: 17.9 mg/dL — ABNORMAL LOW (ref 18–38)

## 2015-04-06 SURGERY — ERCP, WITH INTERVENTION IF INDICATED
Anesthesia: General

## 2015-04-06 SURGERY — CANCELLED PROCEDURE

## 2015-04-06 MED ORDER — ONDANSETRON HCL 4 MG/2ML IJ SOLN
INTRAMUSCULAR | Status: AC
  Filled 2015-04-06: qty 2

## 2015-04-06 MED ORDER — ROCURONIUM BROMIDE 100 MG/10ML IV SOLN
INTRAVENOUS | Status: AC
  Filled 2015-04-06: qty 1

## 2015-04-06 MED ORDER — ETOMIDATE 2 MG/ML IV SOLN
INTRAVENOUS | Status: DC | PRN
  Administered 2015-04-06: 20 mg via INTRAVENOUS

## 2015-04-06 MED ORDER — LIDOCAINE HCL (CARDIAC) 20 MG/ML IV SOLN
INTRAVENOUS | Status: DC | PRN
  Administered 2015-04-06: 50 mg via INTRAVENOUS

## 2015-04-06 MED ORDER — CIPROFLOXACIN IN D5W 400 MG/200ML IV SOLN
400.0000 mg | INTRAVENOUS | Status: DC
  Filled 2015-04-06: qty 200

## 2015-04-06 MED ORDER — ONDANSETRON HCL 4 MG/2ML IJ SOLN
INTRAMUSCULAR | Status: DC | PRN
  Administered 2015-04-06: 4 mg via INTRAVENOUS

## 2015-04-06 MED ORDER — LACTATED RINGERS IV SOLN
INTRAVENOUS | Status: DC | PRN
  Administered 2015-04-06 (×2): via INTRAVENOUS

## 2015-04-06 MED ORDER — LIDOCAINE HCL (CARDIAC) 20 MG/ML IV SOLN
INTRAVENOUS | Status: AC
  Filled 2015-04-06: qty 5

## 2015-04-06 MED ORDER — PHENYLEPHRINE HCL 10 MG/ML IJ SOLN
INTRAMUSCULAR | Status: DC | PRN
  Administered 2015-04-06 (×4): 40 ug via INTRAVENOUS
  Administered 2015-04-06: 80 ug via INTRAVENOUS
  Administered 2015-04-06 (×5): 40 ug via INTRAVENOUS
  Administered 2015-04-06: 80 ug via INTRAVENOUS

## 2015-04-06 MED ORDER — PHENYLEPHRINE 40 MCG/ML (10ML) SYRINGE FOR IV PUSH (FOR BLOOD PRESSURE SUPPORT)
PREFILLED_SYRINGE | INTRAVENOUS | Status: AC
  Filled 2015-04-06: qty 10

## 2015-04-06 MED ORDER — CIPROFLOXACIN IN D5W 400 MG/200ML IV SOLN
INTRAVENOUS | Status: AC
  Filled 2015-04-06: qty 200

## 2015-04-06 MED ORDER — LACTATED RINGERS IV SOLN
INTRAVENOUS | Status: DC
  Administered 2015-04-06: 1000 mL via INTRAVENOUS

## 2015-04-06 MED ORDER — PROPOFOL 10 MG/ML IV BOLUS
INTRAVENOUS | Status: AC
  Filled 2015-04-06: qty 20

## 2015-04-06 MED ORDER — SODIUM CHLORIDE 0.9 % IV SOLN: INTRAVENOUS | Status: DC

## 2015-04-06 MED ORDER — ETOMIDATE 2 MG/ML IV SOLN
INTRAVENOUS | Status: AC
  Filled 2015-04-06: qty 10

## 2015-04-06 MED ORDER — INDOMETHACIN 50 MG RE SUPP
RECTAL | Status: AC
  Filled 2015-04-06: qty 2

## 2015-04-06 MED ORDER — MIDAZOLAM HCL 2 MG/2ML IJ SOLN
INTRAMUSCULAR | Status: AC
  Filled 2015-04-06: qty 4

## 2015-04-06 MED ORDER — SUCCINYLCHOLINE CHLORIDE 20 MG/ML IJ SOLN
INTRAMUSCULAR | Status: DC | PRN
  Administered 2015-04-06: 100 mg via INTRAVENOUS

## 2015-04-06 MED ORDER — EPHEDRINE SULFATE 50 MG/ML IJ SOLN
INTRAMUSCULAR | Status: DC | PRN
  Administered 2015-04-06: 5 mg via INTRAVENOUS
  Administered 2015-04-06: 10 mg via INTRAVENOUS
  Administered 2015-04-06: 5 mg via INTRAVENOUS

## 2015-04-06 MED ORDER — SODIUM CHLORIDE 0.9 % IV SOLN
INTRAVENOUS | Status: DC | PRN
  Administered 2015-04-06: 40 mL

## 2015-04-06 MED ORDER — INDOMETHACIN 50 MG RE SUPP
100.0000 mg | Freq: Once | RECTAL | Status: AC
  Administered 2015-04-06: 100 mg via RECTAL

## 2015-04-06 NOTE — Transfer of Care (Signed)
Immediate Anesthesia Transfer of Care Note  Patient: [name]  Procedure(s) Performed: Procedure(s): ENDOSCOPIC RETROGRADE CHOLANGIOPANCREATOGRAPHY (ERCP) (N/A)  Patient Location: PACU  Anesthesia Type:General  Level of Consciousness: awake, oriented and patient cooperative  Airway & Oxygen Therapy: Patient Spontanous Breathing and Patient connected to face mask oxygen  Post-op Assessment: Report given to RN and Post -op Vital signs reviewed and stable  Post vital signs: Reviewed and stable  Last Vitals:  Filed Vitals:   04/06/15 1452  BP: 103/52  Pulse:   Temp:   Resp: 20    Complications: No apparent anesthesia complications

## 2015-04-06 NOTE — Progress Notes (Signed)
A large common bile duct stone was successfully removed from the bile duct following sphincterotomy.  Recommendations #1 advance diet as tolerated #214 take course of Cipro for bacteremia #3 would hold on cholecystectomy.  Should he develop symptoms from his gallbladder he could undergo an elective cholecystectomy at a later time, or have a cholecystostomy tube placed

## 2015-04-06 NOTE — Progress Notes (Signed)
Patient ID: MONTA POLICE, male   DOB: 1926-08-27, 81 y.o.   MRN: [id number]    Subjective:  Sitting in chair no complaints.  Less jaundice  Objective:  Filed Vitals:   04/05/15 0400 04/05/15 0819 04/05/15 2109 04/06/15 0510  BP: 105/52  121/52 118/56  Pulse: 64  87 80  Temp: 97.3 F (36.3 C) 97.8 F (36.6 C) 98.4 F (36.9 C) 97.8 F (36.6 C)  TempSrc: Oral Oral Oral Oral  Resp: 18  14 19   Height:      Weight: 82.555 kg (182 lb)   86.456 kg (190 lb 9.6 oz)  SpO2: 97%  96% 95%    Intake/Output from previous day:  Intake/Output Summary (Last 24 hours) at 04/06/15 1013 Last data filed at 04/06/15 0520  Gross per 24 hour  Intake   1415 ml  Output    825 ml  Net    590 ml    Physical Exam: Affect appropriate Jaundice elderly white nmale  HEENT: normal Neck supple with no adenopathy JVP normal no bruits no thyromegaly Lungs clear with no wheezing and good diaphragmatic motion Heart:  S1/S2 AS murmur, no rub, gallop or click PMI normal Abdomen: benighn, BS positve, no tenderness, no AAA no bruit.  No HSM or HJR Distal pulses intact with no bruits No edema Neuro non-focal Skin warm and dry No muscular weakness   Lab Results: Basic Metabolic Panel:  Recent Labs  04/05/15 0439 04/06/15 0314  NA 130* 131*  K 3.8 3.8  CL 101 103  CO2 19* 22  GLUCOSE 56* 105*  BUN 60* 61*  CREATININE 3.63* 3.68*  CALCIUM 8.2* 8.1*   Liver Function Tests:  Recent Labs  04/05/15 0439 04/06/15 0314  AST 68* 43*  ALT 71* 59  ALKPHOS 263* 234*  BILITOT 14.0* 6.3*  PROT 4.7* 4.8*  ALBUMIN 1.9* 2.0*   CBC:  Recent Labs  04/04/15 0338 04/05/15 0439  WBC 15.1* 15.3*  NEUTROABS 12.7* 13.0*  HGB 9.5* 8.5*  HCT 26.5* 23.9*  MCV 87.2 88.8  PLT 140* 147*   Cardiac Enzymes: No results for input(s): CKTOTAL, CKMB, CKMBINDEX, TROPONINI in the last 72 hours.  Imaging: Dg Ercp  04/04/2015   CLINICAL DATA:  Jaundice.  EXAM: ERCP  TECHNIQUE: Multiple spot images obtained  with the fluoroscopic device and submitted for interpretation post-procedure.  FLUOROSCOPY TIME:  Fluoroscopy Time:  16 minutes and 47 seconds  Number of Acquired Images:  1 image  COMPARISON:  MRI 04/02/2015  FINDINGS: Single ERCP image was obtained. Evidence for a wire in the pancreatic duct. No cannulation or opacification of the biliary system.  IMPRESSION: Common bile duct was not cannulated or opacified on this examination.  These images were submitted for radiologic interpretation only. Please see the procedural report for the amount of contrast and the fluoroscopy time utilized.   Electronically Signed   By: Markus Daft M.D.   On: 04/04/2015 17:43    Cardiac Studies:  ECG: 04/02/15  SR rate 77 first degree LAD    Telemetry:  NSR PVC  Has converted from PAF  04/06/2015   Echo:  03/25/15  EF 55-60%  AV sclerosis not stenosis   Medications:   . ciprofloxacin  400 mg Intravenous Q24H  . diltiazem  180 mg Oral Daily  . feeding supplement (ENSURE ENLIVE)  237 mL Oral BID BM  . levothyroxine  25 mcg Oral QAC breakfast  . pantoprazole  40 mg Oral Q0600  . pramipexole  1 mg Oral QHS  . sodium chloride  3 mL Intravenous Q12H     . dextrose 5 % and 0.45% NaCl 75 mL/hr at 04/05/15 2309    Assessment/Plan:  PAF:  On PO cardizem now per Dr Percival Spanish no anticoagulation given acute abdominal process Elevated Troponin:  Back down no chest pain no plans for invasive evalaution Cholycystitis:  With ecoli bacteremia.  On cipro failed ERCP Repeat procedure today Resume eliquis After GI issues resolved Murmur:  AV sclerosis no stenosis by echo   Cardiac status stable call if needed   Jenkins Rouge 04/06/2015, 10:13 AM

## 2015-04-06 NOTE — Progress Notes (Signed)
CSW received call from [place] RN [phone] [place] [phone]. Provided auth for patient to be transferred to SNF effective 04/06/15-04/11/15 with updated clinicals needed on 04/11/15 by the SNF.  She will notify the [facility] of above information. Awaiting stability per MD for d/c to SNF.  Lorie Phenix. Pauline Good, [place]  (coverage)

## 2015-04-06 NOTE — H&P (View-Only) (Signed)
ERCP was not successful in cannulating the common bile duct.  Plan repeat ERCP in 48 hours

## 2015-04-06 NOTE — Anesthesia Postprocedure Evaluation (Signed)
  Anesthesia Post-op Note  Patient: [name]  Procedure(s) Performed: Procedure(s) (LRB): ENDOSCOPIC RETROGRADE CHOLANGIOPANCREATOGRAPHY (ERCP) (N/A)  Patient Location: PACU  Anesthesia Type: General  Level of Consciousness: awake and alert   Airway and Oxygen Therapy: Patient Spontanous Breathing  Post-op Pain: mild  Post-op Assessment: Post-op Vital signs reviewed, Patient's Cardiovascular Status Stable, Respiratory Function Stable, Patent Airway and No signs of Nausea or vomiting  Last Vitals:  Filed Vitals:   04/06/15 1452  BP: 103/52  Pulse:   Temp:   Resp: 20    Post-op Vital Signs: stable   Complications: No apparent anesthesia complications

## 2015-04-06 NOTE — Progress Notes (Signed)
[facility]                                                                              Patient Demographics  [name], is a 81 y.o. male, DOB - Dec 02, 1926, NLZ:[id number]  Admit date - 03/28/2015   Admitting Physician Donne Hazel, MD  Outpatient Primary MD for the patient is [name], MD  LOS - 9   Subjective:   Patient seen prior to his procedure, was sitting at bedside shaving has beard with the help of nurse technician. Denies any abdominal pain, denies any fever or chills.   Chief Complaint  Patient presents with  . Hypoglycemia  . 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 on 8/8 with complaints of generalized weakness. He had been seen at an urgent care the day prior for epigastric pain.   On EMS arrival to the home, the patient was found to be hypotensive and hypoglycemic which responded to IVF's and dextrose. The patient reported of approximately 48 hours of epigastric pain, malaise and fevers. Initial work up showed temp 101, WBC 22.8, tachycardia, hypotension, AST/ALT 454/521 / bilirubin 6.1, sr cr 2.32, Na 129 and a lactic acid of 4.56. Initial CXR showed a large hiatal hernia but no edema or consolidation. An ultrasound of the abdomen was obtained and revealed an abnormal appearance of the gallbladder with contraction, Tiu thickening and nodularity without discrete. Follow up CT of the abdomen was assessed and showed an large, non-obstructing hiatal hernia with intrathoracic stomach dilatation, colon diverticulosis, peri-nephric stranding and free fluid in the pelvis. The patient was admitted per [facility]. He was also evaluated by GI and CCS who both feel findings are consistent with cholangitis and sepsis. The patient was volume resuscitated,  treated with abx (aztreonam/vanco in ER, then to levaquin / flagyl), pan cultured and monitored on SDU. Unfortunately, the afternoon of 8/9, he deteriorated and required emergent intubation with transfer to ICU.  STUDIES:/EVENTS 8/08 Admitted with abdominal pain, hypotension. 8/08 [place] ABD >> abnormal appearance of the gallbladder with contraction, Longane[place] thickening and nodularity, no discrete stones, no other acute abnormality. CBD 8.5 mm 8/08 CT ABD/Pelvis >> gaseous distention of the stomach, large hiatal hernia, small high attenuation structure in the distal common bile duct (? Choledocholithiasis or small periampullary duodenum diverticulum), non-specific perinephric stranding, small volume free fluid within the pelvis  03/29/15: intubated. Moved to ICU 03/30/15: Not on pressors. Extubated. Repeated transient asymptomatic hypoglycemia per RN. He has denued abd pain to CCS and GI. CCS suspecting CBD stone with obstruction based on LFT but d/w DR Carlean Purl of GI - he reviewed CT with radiology - CBD stone/dilatation presence not convincing. GI Blood culture growing Gram Negativ Coccobacilli   Assessment & Plan     Severe Sepsis with Escherichia coli bacteremia - Continue IV ciprofloxacin currently stable, not spiking any fevers - ERCP was not successful on 8/15,  GI her. ERCP 8/17 a.m. with successful removal of 15 mm impacted CBD stone.   Acute cholangitis Patient probably has ascending acute cholangeitis, according to [facility] guidelines with fever, jaundice and presence of impacted CBD stone Patient does have Escherichia coli bacteremia and CBD impacted stone. Underwent ERCP on 04/06/2015 with successful removal of the impacted CBD stone. Continue antibiotics at least for 14 days.   Atrial fibrillation with RVR: Currently normal sinus rhythm - Patient back in normal sinus rhythm, appreciate cardiology recommendations, transitioned to oral Cardizem today. - Recommendations DC amlodipine  outpatient, currently metoprolol on hold. - Continue to hold eliquis, restart eliquis when okay with GI and cardiology - Patient had an echo on 8/10, showed EF of 89-38%, grade 2 diastolic dysfunction    Acute hypercarbic respiratory failure - Extubated on 03/30/15, continue as needed albuterol  Acute on chronic kidney disease- Likely worsened due to ATN - Creatinine function plateaued at 3.6, nephrology following  Transaminitis with transient biliary obstruction, cholecystitis, Escherichia coli bacteremia - Currently tolerating diet, general surgery, gastroenterology following - ERCP today  Anemia, thrombocytopenia -Platelets currently improving  Hypokalemia Currently stable   Code Status:Full code   Family Communication: Discussed in detail with the patient, all imaging results, lab results explained to the patient and patient's son on the phone   Disposition Plan:Not medically ready , no changes in the management today   Time Spent in minutes   20 minutes  Procedures   Mechanical ventilation Ultrasound abdomen CT abdomen  Consults   Cardiology  General surgery  GI  CCM  DVT Prophylaxis  SCD's  Medications  Scheduled Meds: . ciprofloxacin  400 mg Intravenous Q24H  . diltiazem  180 mg Oral Daily  . feeding supplement (ENSURE ENLIVE)  237 mL Oral BID BM  . levothyroxine  25 mcg Oral QAC breakfast  . pantoprazole  40 mg Oral Q0600  . pramipexole  1 mg Oral QHS  . sodium chloride  3 mL Intravenous Q12H   Continuous Infusions: . dextrose 5 % and 0.45% NaCl 75 mL/hr at 04/05/15 2309   PRN Meds:.albuterol, docusate sodium, metoprolol, polyvinyl alcohol   Antibiotics   Anti-infectives    Start     Dose/Rate Route Frequency Ordered Stop   04/05/15 2200  ciprofloxacin (CIPRO) IVPB 400 mg     400 mg 200 mL/hr over 60 Minutes Intravenous Every 24 hours 04/05/15 1150     04/05/15 0000  ciprofloxacin (CIPRO) IVPB 400 mg  Status:  Discontinued     400 mg 200  mL/hr over 60 Minutes Intravenous Every 12 hours 04/04/15 1617 04/05/15 1150   03/31/15 1000  ciprofloxacin (CIPRO) IVPB 400 mg  Status:  Discontinued     400 mg 200 mL/hr over 60 Minutes Intravenous Every 24 hours 03/31/15 0933 04/04/15 1742   03/30/15 1400  vancomycin (VANCOCIN) IVPB 1000 mg/200 mL premix  Status:  Discontinued     1,000 mg 200 mL/hr over 60 Minutes Intravenous Every 48 hours 03/30/15 0751 03/31/15 0933   03/30/15 1300  levofloxacin (LEVAQUIN) IVPB 500 mg  Status:  Discontinued     500 mg 100 mL/hr over 60 Minutes Intravenous Every 48 hours 03/28/15 1407 03/28/15 1645   03/30/15 1230  levofloxacin (LEVAQUIN) IVPB 500 mg  Status:  Discontinued     500 mg 100 mL/hr over 60 Minutes Intravenous Every 48 hours 03/28/15 1707 03/29/15 1619   03/29/15 1500  vancomycin (VANCOCIN) IVPB 750 mg/150 ml premix  Status:  Discontinued  750 mg 150 mL/hr over 60 Minutes Intravenous Every 24 hours 03/28/15 1407 03/28/15 1645   03/29/15 1400  vancomycin (VANCOCIN) IVPB 750 mg/150 ml premix  Status:  Discontinued     750 mg 150 mL/hr over 60 Minutes Intravenous Every 24 hours 03/28/15 1713 03/30/15 0750   03/29/15 1230  levofloxacin (LEVAQUIN) IVPB 750 mg  Status:  Discontinued     750 mg 100 mL/hr over 90 Minutes Intravenous  Once 03/28/15 1656 03/28/15 1707   03/28/15 2100  aztreonam (AZACTAM) 1 g in dextrose 5 % 50 mL IVPB  Status:  Discontinued     1 g 100 mL/hr over 30 Minutes Intravenous Every 8 hours 03/28/15 1407 03/28/15 1645   03/28/15 2100  aztreonam (AZACTAM) 1 g in dextrose 5 % 50 mL IVPB  Status:  Discontinued     1 g 100 mL/hr over 30 Minutes Intravenous 3 times per day 03/28/15 1713 03/31/15 0933   03/28/15 1700  levofloxacin (LEVAQUIN) IVPB 750 mg  Status:  Discontinued     750 mg 100 mL/hr over 90 Minutes Intravenous  Once 03/28/15 1645 03/28/15 1656   03/28/15 1700  metroNIDAZOLE (FLAGYL) IVPB 500 mg  Status:  Discontinued     500 mg 100 mL/hr over 60 Minutes  Intravenous  Once 03/28/15 1645 03/28/15 1658   03/28/15 1700  metroNIDAZOLE (FLAGYL) IVPB 500 mg  Status:  Discontinued     500 mg 100 mL/hr over 60 Minutes Intravenous Every 8 hours 03/28/15 1658 03/31/15 0940   03/28/15 1245  vancomycin (VANCOCIN) 1,500 mg in sodium chloride 0.9 % 500 mL IVPB     1,500 mg 250 mL/hr over 120 Minutes Intravenous STAT 03/28/15 1235 03/28/15 1649   03/28/15 1230  levofloxacin (LEVAQUIN) IVPB 750 mg     750 mg 100 mL/hr over 90 Minutes Intravenous  Once 03/28/15 1228 03/28/15 1440   03/28/15 1230  aztreonam (AZACTAM) 2 g in dextrose 5 % 50 mL IVPB     2 g 100 mL/hr over 30 Minutes Intravenous  Once 03/28/15 1228 03/28/15 1354   03/28/15 1230  vancomycin (VANCOCIN) IVPB 1000 mg/200 mL premix  Status:  Discontinued     1,000 mg 200 mL/hr over 60 Minutes Intravenous  Once 03/28/15 1228 03/28/15 1234        Subjective:   [name] was seen and examined today. No complaints, no nausea, vomiting, chest pain, shortness of breath, in normal sinus rhythm. Afebrile. Patient denies dizziness, chest pain, abdominal pain, New weakness, numbess, tingling  Objective:   Blood pressure 144/70, pulse 73, temperature 97.6 F (36.4 C), temperature source Oral, resp. rate 20, height 5\' 11"  (1.803 m), weight 77.111 kg (170 lb), SpO2 92 %.  Wt Readings from Last 3 Encounters:  04/06/15 77.111 kg (170 lb)  02/28/15 72.213 kg (159 lb 3.2 oz)  01/26/15 73.936 kg (163 lb)     Intake/Output Summary (Last 24 hours) at 04/06/15 1627 Last data filed at 04/06/15 1452  Gross per 24 hour  Intake   2515 ml  Output    825 ml  Net   1690 ml    Exam  General: Alert and oriented x 3, NAD  HEENT:  PERRLA, EOMI,   Neck: Supple, no JVD,  CVS: S1 and S2 clear, RRR  Respiratory: CTAB  Abdomen: Soft, nt, nd, nbs  Ext: no cyanosis clubbing or edema  Neuro: no new deficits  Skin: No rashes  Psych: Normal affect and demeanor, alert and oriented x3  Data Review    Micro Results Recent Results (from the past 240 hour(s))  Blood Culture (routine x 2)     Status: None   Collection Time: 03/28/15 12:40 PM  Result Value Ref Range Status   Specimen Description BLOOD RIGHT ANTECUBITAL  Final   Special Requests BOTTLES DRAWN AEROBIC AND ANAEROBIC 5CC  Final   Culture  Setup Time   Final    GRAM NEGATIVE COCCOBACILLI IN BOTH AEROBIC AND ANAEROBIC BOTTLES CRITICAL RESULT CALLED TO, READ BACK BY AND VERIFIED WITH: C GOSS RN 2300 03/28/15 A BROWNING    Culture ESCHERICHIA COLI  Final   Report Status 03/30/2015 FINAL  Final   Organism ID, Bacteria ESCHERICHIA COLI  Final      Susceptibility   Escherichia coli - MIC*    AMPICILLIN >=32 RESISTANT Resistant     CEFAZOLIN 8 SENSITIVE Sensitive     CEFEPIME <=1 SENSITIVE Sensitive     CEFTAZIDIME <=1 SENSITIVE Sensitive     CEFTRIAXONE <=1 SENSITIVE Sensitive     CIPROFLOXACIN <=0.25 SENSITIVE Sensitive     GENTAMICIN <=1 SENSITIVE Sensitive     IMIPENEM <=0.25 SENSITIVE Sensitive     TRIMETH/SULFA <=20 SENSITIVE Sensitive     AMPICILLIN/SULBACTAM >=32 RESISTANT Resistant     PIP/TAZO 64 INTERMEDIATE Intermediate     * ESCHERICHIA COLI  Blood Culture (routine x 2)     Status: None   Collection Time: 03/28/15 12:54 PM  Result Value Ref Range Status   Specimen Description BLOOD RIGHT HAND  Final   Special Requests BOTTLES DRAWN AEROBIC AND ANAEROBIC 3CC  Final   Culture  Setup Time   Final    GRAM NEGATIVE RODS IN BOTH AEROBIC AND ANAEROBIC BOTTLES CRITICAL RESULT CALLED TO, READ BACK BY AND VERIFIED WITH: B LEAP,RN [id number] [place]    Culture   Final    ESCHERICHIA COLI SUSCEPTIBILITIES PERFORMED ON PREVIOUS CULTURE WITHIN THE LAST 5 DAYS.    Report Status 03/30/2015 FINAL  Final  Urine culture     Status: None   Collection Time: 03/28/15  1:00 PM  Result Value Ref Range Status   Specimen Description URINE, CATHETERIZED  Final   Special Requests NONE  Final   Culture NO GROWTH 2 DAYS  Final    Report Status 03/30/2015 FINAL  Final  MRSA PCR Screening     Status: None   Collection Time: 03/29/15  3:18 AM  Result Value Ref Range Status   MRSA by PCR NEGATIVE NEGATIVE Final    Comment:        The GeneXpert MRSA Assay (FDA approved for NASAL specimens only), is one component of a comprehensive MRSA colonization surveillance program. It is not intended to diagnose MRSA infection nor to guide or monitor treatment for MRSA infections.   Culture, respiratory (NON-Expectorated)     Status: None   Collection Time: 03/29/15  4:27 PM  Result Value Ref Range Status   Specimen Description TRACHEAL ASPIRATE  Final   Special Requests NONE  Final   Gram Stain   Final    FEW WBC PRESENT,BOTH PMN AND MONONUCLEAR RARE SQUAMOUS EPITHELIAL CELLS PRESENT RARE GRAM POSITIVE COCCI IN PAIRS Performed at [facility]    Culture   Final    Non-Pathogenic Oropharyngeal-type Flora Isolated. Performed at [facility]    Report Status 03/31/2015 FINAL  Final    Radiology Reports Ct Abdomen Pelvis Wo Contrast  03/28/2015   CLINICAL DATA:  81 year old male with generalized weakness,  low blood sugar and epigastric pain  EXAM: CT ABDOMEN AND PELVIS WITHOUT CONTRAST  TECHNIQUE: Multidetector CT imaging of the abdomen and pelvis was performed following the standard protocol without IV contrast.  COMPARISON:  Abdominal ultrasound performed earlier today  FINDINGS: Lower Chest: Massive hiatal hernia. Ingested oral contrast material lays within the partially intra thoracic stomach which is significantly dilated with gas. The heart is within normal limits for size. Atherosclerotic calcifications present throughout the visualized coronary arteries. No pericardial effusion. Chronic atelectasis in the bilateral lower lungs worse on the left than the right around the large hiatal hernia. Otherwise, the visualized lower lungs are essentially clear. Focal subpleural steatosis adjacent to the right  middle lobe.  Abdomen: Unenhanced CT was performed per clinician order. Lack of IV contrast limits sensitivity and specificity, especially for evaluation of abdominal/pelvic solid viscera. Mild gaseous distension of the intra-abdominal portion of the stomach. Unremarkable CT appearance of the spleen, adrenal glands and pancreas. The gallbladder is contracted. There is a mild biliary ductal dilatation. A 6 x 8 mm high attenuation structure either within or immediately adjacent to the distal common bile duct. It is unclear if this represents a small periampullary duodenum diverticulum or choledocholithiasis.  The liver is otherwise normal in size and contour. No definite liver lesion given the limitations of absent IV contrast. Nonspecific perinephric stranding bilaterally. No hydronephrosis. No nephrolithiasis. No free fluid or suspicious adenopathy. No evidence of a bowel obstruction or focal bowel [place] thickening. Moderate volume of formed stool suggests constipation. Colonic diverticular disease without CT evidence of active inflammation.  Pelvis: The bladder is decompressed. Surgical changes of prior prostatectomy. Trace free fluid layers dependently within the pelvis.  Bones/Soft Tissues: No acute fracture or aggressive appearing lytic or blastic osseous lesion. Advanced multilevel degenerative disc disease. Small left fat containing inguinal hernia. Dextro convex scoliosis of the lumbar spine.  Vascular: Scattered atherosclerotic vascular calcifications without evidence of aneurysm. Otherwise, evaluation significantly limited in the absence of intravenous contrast.  IMPRESSION: 1. The patient's mid epigastric pain may be related to gaseous distension of the stomach including the large hiatal hernia and intra thoracic portion of the stomach. As the stomach is incompletely imaged, a component of acute or chronic gastric volvulus is difficult to exclude entirely although is clear there is not complete obstruction  given passage of ingested oral contrast material into the distal bowel. 2. Small high attenuation structure in the region of the distal common bile duct may represent choledocholithiasis or a small periampullary duodenum diverticulum. A small duodenum diverticulum is favored. Recommend clinical correlation for evidence of a biliary obstruction or right upper quadrant pain. If clinically warranted, MRCP could further evaluate. 3. Nonspecific perinephric stranding bilaterally. 4. Colonic diverticular disease without CT evidence of active inflammation. 5. Small volume free fluid layers within the pelvis. This is abnormal in a male patient has and is likely secondary to an underlying infectious or inflammatory process. 6. Advanced multilevel degenerative disc disease with dextro convex scoliosis. 7. Scattered atherosclerotic vascular calcifications.   Electronically Signed   By: Jacqulynn Cadet M.D.   On: 03/28/2015 20:15   US Abdomen Complete  03/28/2015   CLINICAL DATA:  Elevated liver function studies, elevated lactate level, epigastric pain  EXAM: ULTRASOUND ABDOMEN COMPLETE  COMPARISON:  Abdominal ultrasound of December 13, 2011  FINDINGS: Gallbladder: The gallbladder is contracted and its [place] is irregular. Stones have been described in the past but discrete stones are not observed today.  Common bile duct: Diameter: 8.5  mm  Liver: The liver exhibits no focal mass nor ductal dilation. The surface contour is normal.  IVC: No abnormality visualized.  Pancreas: The pancreas was obscured by bowel gas.  Spleen: Size and appearance within normal limits.  Right Kidney: Length: 10.5 cm. Echogenicity within normal limits. No mass or hydronephrosis visualized.  Left Kidney: Length: 10.5 cm. Echogenicity within normal limits. No mass or hydronephrosis visualized.  Abdominal aorta: No aneurysm visualized.  Other findings: No ascites is demonstrated.  IMPRESSION: 1. Abnormal appearance of the gallbladder with contraction an  Roscher thickening and nodularity. Stones may be present but are not discretely demonstrated. 2. No acute abnormality of the liver, spleen, or kidneys. The pancreas was obscured by bowel gas. 3. Given the patient's laboratory findings and chief complaint, further evaluation with abdominal and pelvic CT scanning would be useful.   Electronically Signed   By: David  Martinique M.D.   On: 03/28/2015 15:53   Mr Abdomen Mrcp Wo Cm  04/03/2015   CLINICAL DATA:  81 year old male with fever and abdominal pain. E coli bacteremia. Acute respiratory failure requiring intubation. Chronic renal dysfunction. Hyperbilirubinemia.  EXAM: MRI ABDOMEN WITHOUT CONTRAST  (INCLUDING MRCP)  TECHNIQUE: Multiplanar multisequence MR imaging of the abdomen was performed. Heavily T2-weighted images of the biliary and pancreatic ducts were obtained, and three-dimensional MRCP images were rendered by post processing.  COMPARISON:  CT of the abdomen and pelvis 03/28/2015.  FINDINGS: Comment: Study is limited for assessment of this visceral and/or vascular lesions by lack of IV gadolinium. Additionally, there is considerable patient respiratory motion.  Lower chest: Massive hiatal hernia. Small to moderate right pleural effusion.  Hepatobiliary: MRCP images are exceedingly limited by patient motion. With these limitations in mind, there is moderate to severe dilatation of the common bile duct and common hepatic duct. Common bile duct measures at least 15 mm in diameter. Mild intrahepatic biliary ductal dilatation is also noted. In the distal aspect of the common bile duct there is a small signal void, which corresponds to the suspected calcified stone on prior CT scan 03/28/2015. This likely represents a ductal stone. No discrete hepatic lesions are identified on today's limited examination.  Pancreas: No definite pancreatic mass. No pancreatic ductal dilatation. No peripancreatic fluid or inflammatory changes.  Spleen: Unremarkable.  Adrenals/Urinary  Tract: Adrenal glands and kidneys are grossly normal in appearance on today's limited noncontrast examination.  Stomach/Bowel: Massive hiatal hernia with predominantly intrathoracic stomach, and containing a short segment of the transverse colon.  Vascular/Lymphatic: No definite aneurysm in the visualized abdominal arterial vasculature. No lymphadenopathy the visualized portions of the abdomen.  Other: Perinephric fluid bilaterally.  Musculoskeletal: No aggressive osseous lesions noted in the visualized portions of the skeleton.  IMPRESSION: 1. Findings remain concerning for choledocholithiasis with probable stone in the distal common bile duct. This is associated with moderate to severe extrahepatic biliary ductal dilatation and mild intrahepatic biliary ductal dilatation. Correlation with ERCP is recommended, if clinically appropriate. 2. Limited examination with additional findings, as above.   Electronically Signed   By: Vinnie Langton M.D.   On: 04/03/2015 13:19   Mr 3d Recon At Scanner  04/03/2015   CLINICAL DATA:  81 year old male with fever and abdominal pain. E coli bacteremia. Acute respiratory failure requiring intubation. Chronic renal dysfunction. Hyperbilirubinemia.  EXAM: MRI ABDOMEN WITHOUT CONTRAST  (INCLUDING MRCP)  TECHNIQUE: Multiplanar multisequence MR imaging of the abdomen was performed. Heavily T2-weighted images of the biliary and pancreatic ducts were obtained, and three-dimensional MRCP images were  rendered by post processing.  COMPARISON:  CT of the abdomen and pelvis 03/28/2015.  FINDINGS: Comment: Study is limited for assessment of this visceral and/or vascular lesions by lack of IV gadolinium. Additionally, there is considerable patient respiratory motion.  Lower chest: Massive hiatal hernia. Small to moderate right pleural effusion.  Hepatobiliary: MRCP images are exceedingly limited by patient motion. With these limitations in mind, there is moderate to severe dilatation of the  common bile duct and common hepatic duct. Common bile duct measures at least 15 mm in diameter. Mild intrahepatic biliary ductal dilatation is also noted. In the distal aspect of the common bile duct there is a small signal void, which corresponds to the suspected calcified stone on prior CT scan 03/28/2015. This likely represents a ductal stone. No discrete hepatic lesions are identified on today's limited examination.  Pancreas: No definite pancreatic mass. No pancreatic ductal dilatation. No peripancreatic fluid or inflammatory changes.  Spleen: Unremarkable.  Adrenals/Urinary Tract: Adrenal glands and kidneys are grossly normal in appearance on today's limited noncontrast examination.  Stomach/Bowel: Massive hiatal hernia with predominantly intrathoracic stomach, and containing a short segment of the transverse colon.  Vascular/Lymphatic: No definite aneurysm in the visualized abdominal arterial vasculature. No lymphadenopathy the visualized portions of the abdomen.  Other: Perinephric fluid bilaterally.  Musculoskeletal: No aggressive osseous lesions noted in the visualized portions of the skeleton.  IMPRESSION: 1. Findings remain concerning for choledocholithiasis with probable stone in the distal common bile duct. This is associated with moderate to severe extrahepatic biliary ductal dilatation and mild intrahepatic biliary ductal dilatation. Correlation with ERCP is recommended, if clinically appropriate. 2. Limited examination with additional findings, as above.   Electronically Signed   By: Vinnie Langton M.D.   On: 04/03/2015 13:19   Dg Chest Port 1 View  03/30/2015   CLINICAL DATA:  Acute respiratory failure.  EXAM: PORTABLE CHEST - 1 VIEW  COMPARISON:  03/29/2015.  FINDINGS: 1116 hours. Endotracheal tube tip is 3.5 cm above the carina. Nasogastric tube projects below the diaphragm, tip not visualized. There is interval improved aeration of the lung bases with mild residual bibasilar atelectasis. No  confluent airspace opacity, edema or significant pleural effusion. Cardiomegaly and a large hiatal hernia are again noted post CABG.  IMPRESSION: Interval improved aeration of the lung bases. Stable support system.   Electronically Signed   By: Richardean Sale M.D.   On: 03/30/2015 11:44   Dg Chest Port 1 View  03/29/2015   CLINICAL DATA:  Evaluate endotracheal tube position.  EXAM: PORTABLE CHEST - 1 VIEW  COMPARISON:  03/29/2015.  FINDINGS: Support apparatus: Endotracheal tube 3 cm from the carina. Enteric tube is present with the tip not visible. Side port is in the proximal gastric fundus. Monitoring leads project over the chest.  Cardiomediastinal Silhouette:  Unchanged.  Lungs: Bilateral basilar atelectasis, greater on the LEFT when compared to the RIGHT. No pneumothorax.  Effusions: RIGHT-greater-than-LEFT pleural effusions, likely partially loculated on the RIGHT.  Other:  Large hiatal hernia with intrathoracic gastric air bubble.  IMPRESSION: 1. Endotracheal tube tip 3 cm from the carina. 2. Nasogastric tube in the stomach. 3. Cardiomegaly with bilateral pleural effusions. 4. Large hiatal hernia.   Electronically Signed   By: Dereck Ligas M.D.   On: 03/29/2015 18:40   Dg Chest Port 1 View  03/29/2015   CLINICAL DATA:  Intubation  EXAM: PORTABLE CHEST - 1 VIEW  COMPARISON:  03/28/2015  FINDINGS: Endotracheal tube just above the carina. Recommend withdrawal  of 3 cm.  Hypoventilation. Elevated left hemidiaphragm with left lower lobe atelectasis unchanged. Mild right lower lobe atelectasis and small bilateral effusions.  Improved aeration of the lungs compared with earlier today  IMPRESSION: Endotracheal tube at the carina, recommend withdrawal 3 cm.  Improved aeration in the lung bases. Persistent bibasilar atelectasis has improved. Small bilateral pleural effusions.   Electronically Signed   By: Franchot Gallo M.D.   On: 03/29/2015 14:59   Dg Chest Port 1 View  03/28/2015   CLINICAL DATA:  Shortness  of Breath  EXAM: PORTABLE CHEST - 1 VIEW  COMPARISON:  Study obtained earlier in the day  FINDINGS: Large hiatal type hernia is again identified with most of the stomach above the diaphragm. The degree of inspiration shallow. There is no frank edema or consolidation. No appreciable joint effusions. Heart is mildly prominent with pulmonary vascularity within normal limits. Patient is status post coronary artery bypass grafting. No adenopathy.  IMPRESSION: Large hiatal type hernia. No edema or consolidation. Degree of inspiration shallow. No new opacity. No change in cardiac silhouette.   Electronically Signed   By: Lowella Grip III M.D.   On: 03/28/2015 18:36   Dg Chest Portable 1 View  03/28/2015   CLINICAL DATA:  Shortness of breath today  EXAM: PORTABLE CHEST - 1 VIEW  COMPARISON:  03/27/2015  FINDINGS: Cardiac shadow is stable. A large hiatal hernia is again identified. Postsurgical changes are again seen. No focal infiltrate or sizable effusion is noted.  IMPRESSION: Stable large hiatal hernia.  No acute abnormality noted.   Electronically Signed   By: Inez Catalina M.D.   On: 03/28/2015 11:45   Dg Ercp  04/04/2015   CLINICAL DATA:  Jaundice.  EXAM: ERCP  TECHNIQUE: Multiple spot images obtained with the fluoroscopic device and submitted for interpretation post-procedure.  FLUOROSCOPY TIME:  Fluoroscopy Time:  16 minutes and 47 seconds  Number of Acquired Images:  1 image  COMPARISON:  MRI 04/02/2015  FINDINGS: Single ERCP image was obtained. Evidence for a wire in the pancreatic duct. No cannulation or opacification of the biliary system.  IMPRESSION: Common bile duct was not cannulated or opacified on this examination.  These images were submitted for radiologic interpretation only. Please see the procedural report for the amount of contrast and the fluoroscopy time utilized.   Electronically Signed   By: Markus Daft M.D.   On: 04/04/2015 17:43   Dg Ercp Biliary & Pancreatic Ducts  04/06/2015    CLINICAL DATA:  81 year old male with choledocholithiasis  EXAM: ERCP with sphincterotomy  TECHNIQUE: Multiple spot images obtained with the fluoroscopic device and submitted for interpretation post-procedure.  FLUOROSCOPY TIME:  Please see GI operative note for further detail  COMPARISON:  Prior ERCP procedure 04/04/2015  FINDINGS: A total of 12 intraoperative spot images demonstrate a flexible endoscope in the descending duodenum with successful wire cannulation of the common bile duct. Subsequently, sphincterotomy is performed. On cholangiography there is a round filling defect in the distal common bile duct consistent with choledocholithiasis. The images subsequently demonstrate balloon sweeping of the common duct. The filling defect is no longer present on the final images.  IMPRESSION: 1. Successful cannulation of the common bile duct with sphincterotomy and balloon sweep of the common duct. 2. Prior to balloon sweeping, there is a filling defect in the distal common bile duct consistent with choledocholithiasis. 3. On the final images, the filling defect is no longer present. These images were submitted for radiologic interpretation only.  Please see the procedural report for the amount of contrast and the fluoroscopy time utilized.   Electronically Signed   By: Jacqulynn Cadet M.D.   On: 04/06/2015 15:52   Dg Abd Portable 1v  03/30/2015   CLINICAL DATA:  Abnormal LFT  EXAM: PORTABLE ABDOMEN - 1 VIEW  COMPARISON:  03/29/2015  FINDINGS: NG tube in the stomach. Normal bowel gas pattern. Contrast in the rectum from prior CT. Surgical clips in the pelvis.  Lumbar scoliosis and degenerative change.  No renal calculi.  IMPRESSION: Normal bowel gas pattern.  NG tube in the stomach.   Electronically Signed   By: Franchot Gallo M.D.   On: 03/30/2015 11:46   Dg Abd Portable 1v  03/29/2015   CLINICAL DATA:  Check NG tube position  EXAM: PORTABLE ABDOMEN - 1 VIEW  COMPARISON:  03/29/2015 at 1527 hours  FINDINGS:  Enteric tube terminates below the diaphragm in the gastric body.  Nonobstructive bowel gas pattern.  Nodule contrast in the distal colon.  IMPRESSION: Enteric tube terminates below the diaphragm in the gastric body.   Electronically Signed   By: Julian Hy M.D.   On: 03/29/2015 16:55   Dg Abd Portable 1v  03/29/2015   CLINICAL DATA:  NG tube placement.  EXAM: PORTABLE ABDOMEN - 1 VIEW  COMPARISON:  CT scan 03/28/2015.  FINDINGS: The NG tube tip is in the lower aspect of the chest. This is likely in the distal esophagus or possibly the large hiatal hernia. The proximal port is in the mid esophagus.  The stomach demonstrates gaseous distention. There is contrast in the bowel.  IMPRESSION: The NG tube tip is in the distal esophagus or large hiatal hernia.   Electronically Signed   By: Marijo Sanes M.D.   On: 03/29/2015 15:55   US Abdomen Limited Ruq  03/31/2015   CLINICAL DATA:  Questionable choledocholithiasis on recent CT  EXAM: US ABDOMEN LIMITED - RIGHT UPPER QUADRANT  COMPARISON:  Abdominal ultrasound and CT abdomen and pelvis March 28, 2015  FINDINGS: Gallbladder:  The gallbladder is somewhat contracted. There are nonshadowing echogenic foci in the gallbladder similar to 1 day prior. The gallbladder Mcmaster is upper normal in thickness. There is no pericholecystic fluid. No sonographic Murphy sign noted.  Common bile duct:  Diameter: 6 mm. There is no mass or calculus seen in the visualized portions of the common bile duct. Portions of the common bile duct are obscured by gas.  Liver:  No focal lesion identified. Within normal limits in parenchymal echogenicity.  IMPRESSION: Note that the study is less than optimal due to patient's inability to suspend respiration significantly. No choledocholithiasis is seen by ultrasound. Portions of the common bile duct, however, are not well visualized due to the patient's inability to suspend respiration and overlying gas. There is upper normal gallbladder Hardaway  thickness with several non shadowing echogenic foci in the gallbladder. Question non shadowing gallstones versus small foci of sludge.  MRCP would be the optimal study to further evaluate the common bile duct. If patient becomes able to suspend respiration for a significant period of time, consideration may wish to be given to MRCP for further assessment.   Electronically Signed   By: Lowella Grip III M.D.   On: 03/31/2015 02:25    CBC  Recent Labs Lab 03/31/15 0400 04/01/15 0227 04/02/15 0553 04/03/15 0509 04/04/15 0338 04/05/15 0439  WBC 17.7* 13.7* 10.5 12.4* 15.1* 15.3*  HGB 9.9* 10.2* 9.8* 9.8* 9.5* 8.5*  HCT 28.1* 28.4* 27.8* 27.4* 26.5* 23.9*  PLT 94* 86* 94* 109* 140* 147*  MCV 88.9 88.2 86.9 88.1 87.2 88.8  MCH 31.3 31.7 30.6 31.5 31.3 31.6  MCHC 35.2 35.9 35.3 35.8 35.8 35.6  RDW 14.1 14.3 14.4 14.6 14.5 14.8  LYMPHSABS 0.4*  --  0.9 0.6* 1.2 0.9  MONOABS 0.9  --  0.9 1.6* 0.9 0.9  EOSABS 0.0  --  0.2 0.4 0.3 0.5  BASOSABS 0.0  --  0.0 0.0 0.0 0.0    Chemistries   Recent Labs Lab 03/31/15 0400  03/31/15 1845  04/02/15 0553 04/02/15 1530 04/03/15 0509 04/03/15 1427 04/04/15 0338 04/05/15 0439 04/06/15 0314  NA 132*  --  130*  < > 132* 131* 134*  --  133* 130* 131*  K 3.3*  --  4.7  < > 3.6 3.5 3.7  --  3.3* 3.8 3.8  CL 105  --  103  < > 106 102 105  --  103 101 103  CO2 17*  --  14*  < > 18* 20* 17*  --  21* 19* 22  GLUCOSE 100*  --  103*  < > 95 122* 77  --  102* 56* 105*  BUN 50*  --  50*  < > 52* 54* 58*  --  62* 60* 61*  CREATININE 3.35*  --  3.46*  < > 3.72* 3.68* 3.63*  --  3.67* 3.63* 3.68*  CALCIUM 7.8*  --  8.1*  < > 8.7* 8.9 8.6*  --  8.8* 8.2* 8.1*  MG 2.1  --  2.1  --   --   --   --   --   --   --   --   AST  --   < >  --   < > 47*  --   --  79* 84* 68* 43*  ALT  --   < >  --   < > 101*  --   --  89* 81* 71* 59  ALKPHOS  --   < >  --   < > 213*  --   --  256* 257* 263* 234*  BILITOT  --   < >  --   < > 10.6*  --   --  15.7* 16.3* 14.0*  6.3*  < > = values in this interval not displayed. ------------------------------------------------------------------------------------------------------------------ estimated creatinine clearance is 15.3 mL/min (by C-G formula based on Cr of 3.68). ------------------------------------------------------------------------------------------------------------------ No results for input(s): HGBA1C in the last 72 hours. ------------------------------------------------------------------------------------------------------------------ No results for input(s): CHOL, HDL, LDLCALC, TRIG, CHOLHDL, LDLDIRECT in the last 72 hours. ------------------------------------------------------------------------------------------------------------------ No results for input(s): TSH, T4TOTAL, T3FREE, THYROIDAB in the last 72 hours.  Invalid input(s): FREET3 ------------------------------------------------------------------------------------------------------------------ No results for input(s): VITAMINB12, FOLATE, FERRITIN, TIBC, IRON, RETICCTPCT in the last 72 hours.  Coagulation profile  Recent Labs Lab 04/02/15 0553  INR 1.37    No results for input(s): DDIMER in the last 72 hours.  Cardiac Enzymes  Recent Labs Lab 04/02/15 0910 04/02/15 1530 04/02/15 2035  TROPONINI 0.58* 0.07* 0.06*   ------------------------------------------------------------------------------------------------------------------ Invalid input(s): POCBNP  No results for input(s): GLUCAP in the last 72 hours.   [facility] A M.D. [facility] 04/06/2015, 4:27 PM  Pager: [phone] Between 7am to 7pm - call Pager - [phone]  After 7pm go to [email or web address] - password [email or web address]  Call night coverage person covering after 7pm

## 2015-04-06 NOTE — Anesthesia Preprocedure Evaluation (Addendum)
Anesthesia Evaluation  Patient identified by MRN, date of birth, ID band Patient awake    Reviewed: Allergy & Precautions, NPO status , Patient's Chart, lab work & pertinent test results  Airway Mallampati: II  TM Distance: >3 FB Neck ROM: Full    Dental no notable dental hx.    Pulmonary shortness of breath, pneumonia -, unresolved, former smoker,  Intubated 8-9 for sepsis. Extubated 8-10 breath sounds clear to auscultation  Pulmonary exam normal       Cardiovascular hypertension, Pt. on medications and Pt. on home beta blockers + CAD Normal cardiovascular exam+ dysrhythmias Atrial Fibrillation + Valvular Problems/Murmurs Rhythm:Regular Rate:Normal  Dr. Kyla Balzarine note reviewed.  ECHO 03-30-15    Study Conclusions  - Left ventricle: The cavity size was normal. Gatson thickness was normal. Systolic function was normal. The estimated ejection fraction was in the range of 55% to 60%. Dawes motion was normal; there were no regional Herrmann motion abnormalities. Features are consistent with a pseudonormal left ventricular filling pattern, with concomitant abnormal relaxation and increased filling pressure (grade 2 diastolic dysfunction). - Mitral valve: Calcified annulus. There was mild regurgitation. - Left atrium: The atrium was mildly to moderately dilated. - Right ventricle: The cavity size was mildly dilated. - Right atrium: The atrium was moderately dilated. - Tricuspid valve: There was moderate regurgitation. - Pulmonary arteries: Systolic pressure was moderately increased. PA peak pressure: 46 mm Hg (S).    Neuro/Psych  Neuromuscular disease negative psych ROS   GI/Hepatic Neg liver ROS, hiatal hernia, GERD-  Medicated,  Endo/Other  negative endocrine ROS  Renal/GU Renal InsufficiencyRenal diseaseAcute on chronic kidney insufficiency. Baseline Cr 1.2. Dr. Abel Presto note reviewed. Cr. 3.7 K 3.8  negative  genitourinary   Musculoskeletal  (+) Arthritis -,   Abdominal   Peds negative pediatric ROS (+)  Hematology negative hematology ROS (+)   Anesthesia Other Findings   Reproductive/Obstetrics negative OB ROS                       Anesthesia Physical Anesthesia Plan  ASA: III  Anesthesia Plan: General   Post-op Pain Management:    Induction: Intravenous  Airway Management Planned: Oral ETT  Additional Equipment:   Intra-op Plan:   Post-operative Plan: Extubation in OR  Informed Consent: I have reviewed the patients History and Physical, chart, labs and discussed the procedure including the risks, benefits and alternatives for the proposed anesthesia with the patient or authorized representative who has indicated his/her understanding and acceptance.   Dental advisory given  Plan Discussed with: CRNA  Anesthesia Plan Comments:         Anesthesia Quick Evaluation

## 2015-04-06 NOTE — Op Note (Signed)
[facility] [place], [address]   ERCP PROCEDURE REPORT  PATIENT: [name], [name]  MR#: [id number] BIRTHDATE: [birth year]  GENDER: male  ENDOSCOPIST: Inda Castle, MD REFERRED BY:  PROCEDURE DATE:  04/06/2015 PROCEDURE:   ERCP with sphincterotomy/papillotomy, ERCP with removal of calculus/calculi , and ERCP with destruction of calculus/calculi  INDICATIONS:suspected or rule out bile duct stones.  MEDICATIONS: Per Anesthesia TOPICAL ANESTHETIC:  DESCRIPTION OF PROCEDURE:     Physical exam was performed.  Informed consent was obtained from the patient after explaining the benefits, risks, and alternatives to the procedure.  The patient was connected to the monitor and placed in the semi-prone position. IV medicine was administered through an indwelling cannula and oxygen via endotracheal tube.  After administration of sedation, the patients esophagus was intubated and the     endoscope was advanced under direct visualization to the second portion of the duodenum.  The bile duct was successfully cannulated using the [place] with a 0.25 inch wire.  A non-occlusive cholangiogram was performed.  The right and left intrahepatic ducts were normal.  The cystic duct remnant was visualized and a blush of dye was seen in the right upper quadrant consistent with a bile leak.  The common duct was approximately 12mm.  No filling defects were appreciated.  The sphincterotome was withdrawn and a sphincterotomy was performed using blended current .  No bleeding was noted.  The sphincterotome was then removed and an 8.5 French/7 cm biliary stent was placed with return of good bile.  The pancreatic duct was not cannulated intentionally.  The patient was recovered in the ICU and discharged to their floor bed in satisfactory condition.  Estimated blood loss is zero unless otherwise noted in this procedure report.  Again noted was a large  hiatal hernia.  In the second portion of the duodenum there was a moderate sized periampullary diverticulum.  A 0.35 mm guidewire was placed in the pancreatic duct.  an initial attempt was made to place a pancreatic duct stent but the wire fell out of the duct prior to deploying the stent.  The common bile duct was cannulated with a 0.35 mm guidewire. Injection demonstrated diffuse dilatation of the common bile duct. There was a single stone measuring between 12 and 15 mm.  Initial attempts at extracting the stone with a 15 mm balloon stone extractor were unsuccessful because stone was too large to be pulled through the sphincterotomized papilla.  A basket lithotriptor was used to partially fragmented the stone.  Following this the remainder the stone was captured by a balloon basket extractor and pulled into the duodenum.  Final cholangiogram was normal.    The scope was then completely withdrawn from the patient and the procedure terminated.    COMPLICATIONS:    None  ENDOSCOPIC IMPRESSION: Again noted was a large hiatal hernia.  In the second portion of the duodenum there was a moderate sized periampullary diverticulum.  A 0.35 mm guidewire was placed in the pancreatic duct.  an initial attempt was made to place a pancreatic duct stent but the wire fell out of the duct prior to deploying the stent.  The common bile duct was cannulated with a 0.35 mm guidewire. Injection demonstrated diffuse dilatation of the common bile duct. There was a single stone measuring between 12 and 15 mm.  Initial attempts at extracting the stone with a 15 mm balloon stone extractor were unsuccessful because stone was too large to be  pulled through the sphincterotomized papilla.  A basket lithotriptor was used to partially fragmented the stone.  Following this the remainder the stone was captured by a balloon basket extractor and pulled into the duodenum.  Final cholangiogram  was normal RECOMMENDATIONS: Continue antibiotics because of bacteremia    _______________________________ eSigned:  Inda Castle, MD 04/06/2015 2:47 PM   PATIENT NAME:  [name], [name] MR#: [id number]

## 2015-04-06 NOTE — Anesthesia Procedure Notes (Signed)
Procedure Name: Intubation Date/Time: 04/06/2015 1:39 PM Performed by: Sherian Maroon A Pre-anesthesia Checklist: Patient identified, Emergency Drugs available, Suction available, Patient being monitored and Timeout performed Patient Re-evaluated:Patient Re-evaluated prior to inductionOxygen Delivery Method: Circle system utilized Preoxygenation: Pre-oxygenation with 100% oxygen Intubation Type: IV induction Ventilation: Mask ventilation without difficulty Laryngoscope Size: Mac and 4 Grade View: Grade II Tube size: 7.5 mm Number of attempts: 1 Airway Equipment and Method: Stylet Placement Confirmation: ETT inserted through vocal cords under direct vision,  breath sounds checked- equal and bilateral and positive ETCO2 Secured at: 22 cm Tube secured with: Tape Dental Injury: Teeth and Oropharynx as per pre-operative assessment

## 2015-04-06 NOTE — Interval H&P Note (Signed)
History and Physical Interval Note:  04/06/2015 1:17 PM  [name]  has presented today for surgery, with the diagnosis of CBD stones  The various methods of treatment have been discussed with the patient and family. After consideration of risks, benefits and other options for treatment, the patient has consented to  Procedure(s): ENDOSCOPIC RETROGRADE CHOLANGIOPANCREATOGRAPHY (ERCP) (N/A) as a surgical intervention .  The patient's history has been reviewed, patient examined, no change in status, stable for surgery.  I have reviewed the patient's chart and labs.  Questions were answered to the patient's satisfaction.     The recent H&P (dated **04/05/15*) was reviewed, the patient was examined and there is no change in the patients condition since that H&P was completed.   Erskine Emery  04/06/2015, 1:17 PM   Erskine Emery

## 2015-04-06 NOTE — Progress Notes (Signed)
Assessment:  81 yo male with hx of  PAfib, CAD, HTN, HLD, here with cholecystitis and E coli bacteremia, and AKI.   1. AKI likely sec ischemic ATN from sepsis in face of ACE. Has baseline Crt of 1-1.2, now plateuing around 3.6. UO good. 2. E Coli bacteremia - on cipro  3. A fib with RVR - on cardizem. 4 cholecystitis with possible cholodocholithiasis - GI following. Surgery rec outpatient elective cholecystectomy. Repeat ERCP today.  Plan: 1. Anticipate renal fx to recover as he recovers--supportive tx - agree with 75cc/hr fluids.  2.  per primary team.   Renal Attending: Agree with note as articulated above and I examined the pt and reviewed and discussed plans with the Resident Physician above. [name] C  Subjective: Interval History: No new complaints. Ab pain is better.   Objective: Vital signs in last 24 hours: Temp:  [97.6 F (36.4 C)-98.4 F (36.9 C)] 97.6 F (36.4 C) (08/17 1257) Pulse Rate:  [80-88] 88 (08/17 1257) Resp:  [14-19] 15 (08/17 1257) BP: (104-162)/(52-76) 162/76 mmHg (08/17 1257) SpO2:  [93 %-99 %] 93 % (08/17 1257) Weight:  [170 lb (77.111 kg)-190 lb 9.6 oz (86.456 kg)] 170 lb (77.111 kg) (08/17 1257) Weight change: 8 lb 9.6 oz (3.901 kg)  Intake/Output from previous day: 08/16 0701 - 08/17 0700 In: 7989 [P.O.:240; I.V.:975; IV Piggyback:200] Out: 925 [Urine:925] Intake/Output this shift:    General appearance: alert and cooperative Head: Normocephalic, without obvious abnormality, atraumatic Resp: clear to auscultation bilaterally Chest Gorden: no tenderness GI: mild RUQ tender Extremities: extremities normal, atraumatic, no cyanosis or edema  Lab Results:  Recent Labs  04/04/15 0338 04/05/15 0439  WBC 15.1* 15.3*  HGB 9.5* 8.5*  HCT 26.5* 23.9*  PLT 140* 147*   BMET:   Recent Labs  04/05/15 0439 04/06/15 0314  NA 130* 131*  K 3.8 3.8  CL 101 103  CO2 19* 22  GLUCOSE 56* 105*  BUN 60* 61*  CREATININE 3.63* 3.68*   CALCIUM 8.2* 8.1*   No results for input(s): PTH in the last 72 hours. Iron Studies: No results for input(s): IRON, TIBC, TRANSFERRIN, FERRITIN in the last 72 hours. Studies/Results: Dg Ercp  04/04/2015   CLINICAL DATA:  Jaundice.  EXAM: ERCP  TECHNIQUE: Multiple spot images obtained with the fluoroscopic device and submitted for interpretation post-procedure.  FLUOROSCOPY TIME:  Fluoroscopy Time:  16 minutes and 47 seconds  Number of Acquired Images:  1 image  COMPARISON:  MRI 04/02/2015  FINDINGS: Single ERCP image was obtained. Evidence for a wire in the pancreatic duct. No cannulation or opacification of the biliary system.  IMPRESSION: Common bile duct was not cannulated or opacified on this examination.  These images were submitted for radiologic interpretation only. Please see the procedural report for the amount of contrast and the fluoroscopy time utilized.   Electronically Signed   By: Markus Daft M.D.   On: 04/04/2015 17:43    Scheduled: . [MAR Hold] ciprofloxacin  400 mg Intravenous Q24H  . [MAR Hold] diltiazem  180 mg Oral Daily  . [MAR Hold] feeding supplement (ENSURE ENLIVE)  237 mL Oral BID BM  . indomethacin  100 mg Rectal Once  . [MAR Hold] levothyroxine  25 mcg Oral QAC breakfast  . [MAR Hold] pantoprazole  40 mg Oral Q0600  . [MAR Hold] pramipexole  1 mg Oral QHS  . [MAR Hold] sodium chloride  3 mL Intravenous Q12H     LOS: 9 days   [name],  [name] 04/06/2015,1:50 PM

## 2015-04-07 ENCOUNTER — Encounter (HOSPITAL_COMMUNITY): Payer: Self-pay | Admitting: Physician Assistant

## 2015-04-07 LAB — CBC
HCT: 23.7 % — ABNORMAL LOW (ref 39.0–52.0)
HEMOGLOBIN: 8.1 g/dL — AB (ref 13.0–17.0)
MCH: 31.4 pg (ref 26.0–34.0)
MCHC: 34.2 g/dL (ref 30.0–36.0)
MCV: 91.9 fL (ref 78.0–100.0)
Platelets: 261 10*3/uL (ref 150–400)
RBC: 2.58 MIL/uL — AB (ref 4.22–5.81)
RDW: 15.1 % (ref 11.5–15.5)
WBC: 12 10*3/uL — AB (ref 4.0–10.5)

## 2015-04-07 LAB — COMPREHENSIVE METABOLIC PANEL
ALBUMIN: 2.1 g/dL — AB (ref 3.5–5.0)
ALK PHOS: 306 U/L — AB (ref 38–126)
ALT: 69 U/L — AB (ref 17–63)
AST: 71 U/L — AB (ref 15–41)
Anion gap: 10 (ref 5–15)
BUN: 53 mg/dL — AB (ref 6–20)
CALCIUM: 8.4 mg/dL — AB (ref 8.9–10.3)
CHLORIDE: 104 mmol/L (ref 101–111)
CO2: 19 mmol/L — AB (ref 22–32)
CREATININE: 3.37 mg/dL — AB (ref 0.61–1.24)
GFR calc non Af Amer: 15 mL/min — ABNORMAL LOW (ref >60)
GFR, EST AFRICAN AMERICAN: 18 mL/min — AB (ref >60)
GLUCOSE: 87 mg/dL (ref 65–99)
Potassium: 4.6 mmol/L (ref 3.5–5.1)
SODIUM: 133 mmol/L — AB (ref 135–145)
Total Bilirubin: 5.3 mg/dL — ABNORMAL HIGH (ref 0.3–1.2)
Total Protein: 4.7 g/dL — ABNORMAL LOW (ref 6.5–8.1)

## 2015-04-07 MED ORDER — CIPROFLOXACIN HCL 500 MG PO TABS
500.0000 mg | ORAL_TABLET | Freq: Every day | ORAL | Status: DC
  Administered 2015-04-07 – 2015-04-08 (×2): 500 mg via ORAL
  Filled 2015-04-07 (×2): qty 1

## 2015-04-07 MED ORDER — FUROSEMIDE 10 MG/ML IJ SOLN
40.0000 mg | Freq: Once | INTRAMUSCULAR | Status: AC
  Administered 2015-04-07: 40 mg via INTRAVENOUS
  Filled 2015-04-07: qty 4

## 2015-04-07 MED ORDER — TRAZODONE HCL 50 MG PO TABS
50.0000 mg | ORAL_TABLET | Freq: Once | ORAL | Status: AC
  Administered 2015-04-07: 50 mg via ORAL
  Filled 2015-04-07: qty 1

## 2015-04-07 NOTE — Progress Notes (Signed)
Physical Therapy Treatment Patient Details Name: [name] MRN: [id number] DOB: Jan 18, 1927 Today's Date: 04/07/2015    History of Present Illness Patient is an 81 y/o male admitted with fevers and hypotension found to have E coli sepsis possibly sec to cholecystitis. Intubated 8/9-8/11/16.  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  He had an unsuccessful ERCP on 8/15 and is planned for a repeat ERCP on 8/17.       PT Comments    Pt limited today by DOE with gait and generalized leg weakness/decreased endurance.  O2 sats stable on RA (93-96% during gait despite 2/4 DOE).  Pt also concerned about bil LE and scrotal edema.  PT will continue to follow acutely and continue to recommend SNF level rehab at discharge.   Follow Up Recommendations  SNF     Equipment Recommendations  None recommended by PT    Recommendations for Other Services   NA     Precautions / Restrictions Precautions Precautions: Fall Precaution Comments: generally weak and deconditioned    Mobility  Bed Mobility Overal bed mobility: Needs Assistance Bed Mobility: Supine to Sit     Supine to sit: Min assist Sit to supine: Supervision   General bed mobility comments: Min hand held assist to pull to sitting from flat bed.   Transfers Overall transfer level: Needs assistance Equipment used: Rolling walker (2 wheeled) Transfers: Sit to/from Stand Sit to Stand: Min assist         General transfer comment: Min assist to support trunk during transition to stand and sit.  Verbal cues for safe hand placement for both transitions and uncontrolled descent to sit down.   Ambulation/Gait Ambulation/Gait assistance: Min assist Ambulation Distance (Feet): 45 Feet Assistive device: Rolling walker (2 wheeled) Gait  Pattern/deviations: Step-through pattern;Shuffle;Trunk flexed Gait velocity: decreased Gait velocity interpretation: <1.8 ft/sec, indicative of risk for recurrent falls General Gait Details: Pt with slow, shuffling gait pattern over weak legs. Knee instability in stance on the left his "bad knee".  Min assist for balance at trunk during gait.  Pt with 2/4 DOE with gait O2 sats and HR stable on RA.           Balance Overall balance assessment: Needs assistance Sitting-balance support: Feet supported;Bilateral upper extremity supported Sitting balance-Leahy Scale: Fair     Standing balance support: Bilateral upper extremity supported Standing balance-Leahy Scale: Poor                      Cognition Arousal/Alertness: Awake/alert Behavior During Therapy: WFL for tasks assessed/performed Overall Cognitive Status: Within Functional Limits for tasks assessed                             Pertinent Vitals/Pain Pain Assessment: No/denies pain           PT Goals (current goals can now be found in the care plan section) Acute Rehab PT Goals Patient Stated Goal: to get stronger  Progress towards PT goals: Progressing toward goals    Frequency  Min 2X/week    PT Plan Frequency needs to be updated       End of Session Equipment Utilized During Treatment: Gait belt Activity Tolerance: Patient limited by fatigue Patient left: in bed;with call  bell/phone within reach;with bed alarm set     Time: 3507-5732 PT Time Calculation (min) (ACUTE ONLY): 18 min  Charges:  $Therapeutic Activity: 8-22 mins            Alon Mazor B. [place], [place], DPT [phone]   04/07/2015, 4:48 PM

## 2015-04-07 NOTE — Progress Notes (Signed)
Assessment:  81 yo male with hx of  PAfib, CAD, HTN, HLD, here with cholecystitis and E coli bacteremia, and AKI.   1. AKI likely sec ischemic ATN from sepsis in face of ACE. Has baseline Crt of 1-1.2, highest 3.7 here, now slowly improving to 3.37 today. UO good. 2. Sepsis 2/2 to cholangitis and Ecoli bacteremia. - improving, on cipro.  3. Cholelithiasis and choledocholithiasis - s/p sphincterotomy and lithotripsy - GI on board - rec no cholecystectomy for now  4. A fib- on cardizem. GI ok for eliquis resumption.   Plan: 1. Anticipate renal fx will continue to recover as he recovers--cont abx and fluid.  2. Rest per primary team.   Renal Attending: I have reviewed this patient with the Resident Physician, examined pt and took a history.  I agree with the plan and evaluation outlined above. Aritza Brunet C  Subjective: Interval History: No new complaints. Ab pain is better. Feels better overall.   Objective: Vital signs in last 24 hours: Temp:  [97.5 F (36.4 C)-98.2 F (36.8 C)] 97.5 F (36.4 C) (08/18 0546) Pulse Rate:  [73-88] 76 (08/17 2100) Resp:  [15-20] 17 (08/17 2100) BP: (103-162)/(52-76) 138/61 mmHg (08/17 2100) SpO2:  [92 %-100 %] 99 % (08/17 2100) Weight:  [170 lb (77.111 kg)-170 lb 3.1 oz (77.2 kg)] 170 lb 3.1 oz (77.2 kg) (08/18 0546) Weight change: -20 lb 9.6 oz (-9.344 kg)  Intake/Output from previous day: 08/17 0701 - 08/18 0700 In: 1100 [I.V.:1100] Out: 750 [Urine:750] Intake/Output this shift: Total I/O In: 240 [P.O.:240] Out: 550 [Urine:550]  General appearance: alert and cooperative Head: Normocephalic, without obvious abnormality, atraumatic Resp: clear to auscultation bilaterally Heart: rrr, no m/r/g GI: mild RUQ tender Extremities: extremities normal, atraumatic, no cyanosis or edema  Lab Results:  Recent Labs  04/05/15 0439 04/07/15 0514  WBC 15.3* 12.0*  HGB 8.5* 8.1*  HCT 23.9* 23.7*  PLT 147* 261   BMET:   Recent Labs  04/06/15 0314 04/07/15 0514  NA 131* 133*  K 3.8 4.6  CL 103 104  CO2 22 19*  GLUCOSE 105* 87  BUN 61* 53*  CREATININE 3.68* 3.37*  CALCIUM 8.1* 8.4*   No results for input(s): PTH in the last 72 hours. Iron Studies: No results for input(s): IRON, TIBC, TRANSFERRIN, FERRITIN in the last 72 hours. Studies/Results: Dg Ercp Biliary & Pancreatic Ducts  04/06/2015   CLINICAL DATA:  81 year old male with choledocholithiasis  EXAM: ERCP with sphincterotomy  TECHNIQUE: Multiple spot images obtained with the fluoroscopic device and submitted for interpretation post-procedure.  FLUOROSCOPY TIME:  Please see GI operative note for further detail  COMPARISON:  Prior ERCP procedure 04/04/2015  FINDINGS: A total of 12 intraoperative spot images demonstrate a flexible endoscope in the descending duodenum with successful wire cannulation of the common bile duct. Subsequently, sphincterotomy is performed. On cholangiography there is a round filling defect in the distal common bile duct consistent with choledocholithiasis. The images subsequently demonstrate balloon sweeping of the common duct. The filling defect is no longer present on the final images.  IMPRESSION: 1. Successful cannulation of the common bile duct with sphincterotomy and balloon sweep of the common duct. 2. Prior to balloon sweeping, there is a filling defect in the distal common bile duct consistent with choledocholithiasis. 3. On the final images, the filling defect is no longer present. These images were submitted for radiologic interpretation only. Please see the procedural report for the amount of contrast and the fluoroscopy time utilized.  Electronically Signed   By: Jacqulynn Cadet M.D.   On: 04/06/2015 15:52    Scheduled: . ciprofloxacin  500 mg Oral Daily  . diltiazem  180 mg Oral Daily  . feeding supplement (ENSURE ENLIVE)  237 mL Oral BID BM  . levothyroxine  25 mcg Oral QAC breakfast  . pantoprazole  40 mg Oral Q0600  .  pramipexole  1 mg Oral QHS  . sodium chloride  3 mL Intravenous Q12H     LOS: 10 days   Ahmed, Tasrif 04/07/2015,11:34 AM

## 2015-04-07 NOTE — Progress Notes (Signed)
Nutrition Follow-up  DOCUMENTATION CODES:   Not applicable  INTERVENTION:    Continue Ensure Enlive PO BID, each supplement provides 350 kcal and 20 grams of protein  NUTRITION DIAGNOSIS:   Increased nutrient needs related to catabolic illness as evidenced by estimated needs.  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  Labs reviewed: sodium low, BUN and creatinine are elevated.  Patient reports that his appetite is better, but still not able to consume 100% of meals. He thinks he wasn't able to sleep the other night because he drank 2 strawberry Ensure supplements the previous day. Encouraged patient to try drinking a supplement this afternoon to see how it affects him.   Diet Order:  Diet regular Room service appropriate?: Yes; Fluid consistency:: Thin  Skin:  Reviewed, no issues  Last BM:  8/15  Height:   Ht Readings from Last 1 Encounters:  04/06/15 '5\' 11"'  (1.803 m)    Weight:   Wt Readings from Last 1 Encounters:  04/07/15 170 lb 3.1 oz (77.2 kg)    Ideal Body Weight:  75.4 kg  BMI:  Body mass index is 23.75 kg/(m^2).  Estimated Nutritional Needs:   Kcal:  1700-1900  Protein:  90-105 grams  Fluid:  1.8-2 L  EDUCATION NEEDS:   No education needs identified at this time   Molli Barrows, [place], [place], [place] Pager [phone] After Hours Pager [phone]

## 2015-04-07 NOTE — Progress Notes (Signed)
Pt complaining of mild shortness of breath and lower extremity edema; pts lungs diminished at the bases, O2 sats 92-94% on RA; noted to have moderate scrotal edema which per patient "has been like this since i've been here but seems to be more swollen today." Dr. Hartford Poli page and informed of the above findings. IV lasix ordered and given, IVF discontinued per order. Will continue to monitor closely.

## 2015-04-07 NOTE — Progress Notes (Signed)
Daily Rounding Note  04/07/2015, 8:26 AM  LOS: 10 days   SUBJECTIVE:   No use of pain meds in many days.      Feels well, no abd pain, no nausea.  On full liquids.  Appetite improving.  Some pain in left great toe. Has not been up to walk, even in room, for last few days.   OBJECTIVE:         Vital signs in last 24 hours:    Temp:  [97.5 F (36.4 C)-98.2 F (36.8 C)] 97.5 F (36.4 C) (08/18 0546) Pulse Rate:  [73-88] 76 (08/17 2100) Resp:  [15-20] 17 (08/17 2100) BP: (103-162)/(52-76) 138/61 mmHg (08/17 2100) SpO2:  [92 %-100 %] 99 % (08/17 2100) Weight:  [170 lb (77.111 kg)-170 lb 3.1 oz (77.2 kg)] 170 lb 3.1 oz (77.2 kg) (08/18 0546) Last BM Date: 04/04/15 Filed Weights   04/06/15 0510 04/06/15 1257 04/07/15 0546  Weight: 190 lb 9.6 oz (86.456 kg) 170 lb (77.111 kg) 170 lb 3.1 oz (77.2 kg)   General: pleasant, still frail.    Heart: RRR.  No mrg Chest: clear bil.  No labored breathing or cough Abdomen: soft, NT, active BS, ND.   Extremities: no CCE Neuro/Psych:  Pleasant, appropriate, oreinted x 3. Moves all 4 limbs, no tremor. Some slight word finding problems. .   Intake/Output from previous day: 08/17 0701 - 08/18 0700 In: 1100 [I.V.:1100] Out: 750 [Urine:750]  Intake/Output this shift: Total I/O In: -  Out: 350 [Urine:350]  Lab Results:  Recent Labs  04/05/15 0439 04/07/15 0514  WBC 15.3* 12.0*  HGB 8.5* 8.1*  HCT 23.9* 23.7*  PLT 147* 261   BMET  Recent Labs  04/05/15 0439 04/06/15 0314 04/07/15 0514  NA 130* 131* 133*  K 3.8 3.8 4.6  CL 101 103 104  CO2 19* 22 19*  GLUCOSE 56* 105* 87  BUN 60* 61* 53*  CREATININE 3.63* 3.68* 3.37*  CALCIUM 8.2* 8.1* 8.4*   LFT  Recent Labs  04/05/15 0439 04/06/15 0314 04/07/15 0514  PROT 4.7* 4.8* 4.7*  ALBUMIN 1.9* 2.0* 2.1*  AST 68* 43* 71*  ALT 71* 59 69*  ALKPHOS 263* 234* 306*  BILITOT 14.0* 6.3* 5.3*   PT/INR No results for  input(s): LABPROT, INR in the last 72 hours. Hepatitis Panel No results for input(s): HEPBSAG, HCVAB, HEPAIGM, HEPBIGM in the last 72 hours.  Studies/Results: Dg Ercp Biliary & Pancreatic Ducts  04/06/2015   CLINICAL DATA:  81 year old male with choledocholithiasis  EXAM: ERCP with sphincterotomy  TECHNIQUE: Multiple spot images obtained with the fluoroscopic device and submitted for interpretation post-procedure.  FLUOROSCOPY TIME:  Please see GI operative note for further detail  COMPARISON:  Prior ERCP procedure 04/04/2015  FINDINGS: A total of 12 intraoperative spot images demonstrate a flexible endoscope in the descending duodenum with successful wire cannulation of the common bile duct. Subsequently, sphincterotomy is performed. On cholangiography there is a round filling defect in the distal common bile duct consistent with choledocholithiasis. The images subsequently demonstrate balloon sweeping of the common duct. The filling defect is no longer present on the final images.  IMPRESSION: 1. Successful cannulation of the common bile duct with sphincterotomy and balloon sweep of the common duct. 2. Prior to balloon sweeping, there is a filling defect in the distal common bile duct consistent with choledocholithiasis. 3. On the final images, the filling defect is no longer present. These images were submitted  for radiologic interpretation only. Please see the procedural report for the amount of contrast and the fluoroscopy time utilized.   Electronically Signed   By: Jacqulynn Cadet M.D.   On: 04/06/2015 15:52   Scheduled Meds: . ciprofloxacin  400 mg Intravenous Q24H  . diltiazem  180 mg Oral Daily  . feeding supplement (ENSURE ENLIVE)  237 mL Oral BID BM  . levothyroxine  25 mcg Oral QAC breakfast  . pantoprazole  40 mg Oral Q0600  . pramipexole  1 mg Oral QHS  . sodium chloride  3 mL Intravenous Q12H   Continuous Infusions: . dextrose 5 % and 0.45% NaCl 75 mL/hr at 04/07/15 0537   PRN  Meds:.albuterol, docusate sodium, metoprolol, polyvinyl alcohol   ASSESMENT:   *  Choledocholithiasis.  ERCP #1 04/04/15. Periampullary diverticulum prevented successful cannulation of CBD and PD.  ERCP #2 04/07/15: sphincterotomy, lithotripsy of large 12 to 15 mm CBD stone, balloon extraction of fragments.  Jaundice improved.  Transaminases and alk phos rising but suspect transitory following ductal manipulation yesterday.    *  Cholelithiasis (on imaging before 03/2015) and irregular appearance to GB Peragine on 03/28/15 [place].  Per latest surgery note 8/15: "no plans for cholecystectomy this admission currently.... can follow up in the office with Dr. Brantley Stage as an outpatient."  *  Cholangitis, sepsis, E coli bacteremia. Day 11 abx. WBCs improved. On IV Cipro.   *  Large HH with intrathoracic component.  On daily PPI.   *  AKI.  Improved. Baseline stage 3 CKD.   *  PAF.  Eliquis on hold since admission 03/28/15.   *  Normocytic anemia. EGD and colonoscopy in 01/2012: non-bleeding, non-ablated cecal AVMs, left sided/sigmoid tics, large HH  *  Protein malnutrition.  Low prealbumin.  Should improve as appetite/po intake improve with resolution of jaundice and biliary obstruction.  *  Deconditioning.  PT rec was fron SNF rehab post discharge.   PLAN   *  Ok to restart Eliquis.  Complete 14 days abx, today is day 11.  Switched to po cipro at lowered renal dose of 500 mg qday.   *  GI will sign off.  Follow up prn with Dr Deatra Ina.      Azucena Freed  04/07/2015, 8:26 AM Pager: [phone]

## 2015-04-07 NOTE — Progress Notes (Signed)
Patient ID: [name], male   DOB: 1926/08/26, 81 y.o.   MRN: [id number] Patient ID: [name], male   DOB: 16-Jul-1927, 81 y.o.   MRN: [id number]    Subjective:  Sitting in chair no complaints.  Less jaundice  Objective:  Filed Vitals:   04/06/15 1452 04/06/15 1606 04/06/15 2100 04/07/15 0546  BP: 103/52 144/70 138/61   Pulse:  73 76   Temp:  97.6 F (36.4 C) 97.9 F (36.6 C) 97.5 F (36.4 C)  TempSrc:  Oral Oral Oral  Resp: 20  17   Height:      Weight:    77.2 kg (170 lb 3.1 oz)  SpO2: 100% 92% 99%     Intake/Output from previous day:  Intake/Output Summary (Last 24 hours) at 04/07/15 0842 Last data filed at 04/07/15 0759  Gross per 24 hour  Intake   1100 ml  Output   1100 ml  Net      0 ml    Physical Exam: Affect appropriate Jaundice elderly white nmale  HEENT: normal Neck supple with no adenopathy JVP normal no bruits no thyromegaly Lungs clear with no wheezing and good diaphragmatic motion Heart:  S1/S2 AS murmur, no rub, gallop or click PMI normal Abdomen: benighn, BS positve, no tenderness, no AAA no bruit.  No HSM or HJR Distal pulses intact with no bruits No edema Neuro non-focal Skin warm and dry No muscular weakness   Lab Results: Basic Metabolic Panel:  Recent Labs  04/06/15 0314 04/07/15 0514  NA 131* 133*  K 3.8 4.6  CL 103 104  CO2 22 19*  GLUCOSE 105* 87  BUN 61* 53*  CREATININE 3.68* 3.37*  CALCIUM 8.1* 8.4*   Liver Function Tests:  Recent Labs  04/06/15 0314 04/07/15 0514  AST 43* 71*  ALT 59 69*  ALKPHOS 234* 306*  BILITOT 6.3* 5.3*  PROT 4.8* 4.7*  ALBUMIN 2.0* 2.1*   CBC:  Recent Labs  04/05/15 0439 04/07/15 0514  WBC 15.3* 12.0*  NEUTROABS 13.0*  --   HGB 8.5* 8.1*  HCT 23.9* 23.7*  MCV 88.8 91.9  PLT 147* 261   Cardiac Enzymes: No results for input(s): CKTOTAL, CKMB, CKMBINDEX, TROPONINI in the last 72 hours.  Imaging: Dg Ercp Biliary & Pancreatic Ducts  04/06/2015   CLINICAL DATA:  81 year old  male with choledocholithiasis  EXAM: ERCP with sphincterotomy  TECHNIQUE: Multiple spot images obtained with the fluoroscopic device and submitted for interpretation post-procedure.  FLUOROSCOPY TIME:  Please see GI operative note for further detail  COMPARISON:  Prior ERCP procedure 04/04/2015  FINDINGS: A total of 12 intraoperative spot images demonstrate a flexible endoscope in the descending duodenum with successful wire cannulation of the common bile duct. Subsequently, sphincterotomy is performed. On cholangiography there is a round filling defect in the distal common bile duct consistent with choledocholithiasis. The images subsequently demonstrate balloon sweeping of the common duct. The filling defect is no longer present on the final images.  IMPRESSION: 1. Successful cannulation of the common bile duct with sphincterotomy and balloon sweep of the common duct. 2. Prior to balloon sweeping, there is a filling defect in the distal common bile duct consistent with choledocholithiasis. 3. On the final images, the filling defect is no longer present. These images were submitted for radiologic interpretation only. Please see the procedural report for the amount of contrast and the fluoroscopy time utilized.   Electronically Signed   By: Jacqulynn Cadet M.D.   On:  04/06/2015 15:52    Cardiac Studies:  ECG: 04/02/15  SR rate 77 first degree LAD    Telemetry:  NSR PVC  Has converted from PAF  04/07/2015   Echo:  03/25/15  EF 55-60%  AV sclerosis not stenosis   Medications:   . ciprofloxacin  400 mg Intravenous Q24H  . diltiazem  180 mg Oral Daily  . feeding supplement (ENSURE ENLIVE)  237 mL Oral BID BM  . levothyroxine  25 mcg Oral QAC breakfast  . pantoprazole  40 mg Oral Q0600  . pramipexole  1 mg Oral QHS  . sodium chloride  3 mL Intravenous Q12H     . dextrose 5 % and 0.45% NaCl 75 mL/hr at 04/07/15 0537    Assessment/Plan:  PAF:  On PO cardizem now per Dr Percival Spanish no anticoagulation  given acute abdominal process Elevated Troponin:  Back down no chest pain no plans for invasive evalaution Cholycystitis:  With ecoli bacteremia.  On cipro successful ERCP with large stone removal yesterday Resume eliquis ? In 48 hrs per primary team/GI Murmur:  AV sclerosis no stenosis by echo   Cardiac status stable call if needed   Jenkins Rouge 04/07/2015, 8:42 AM

## 2015-04-07 NOTE — Care Management Important Message (Signed)
Important Message  Patient Details  Name: [name] MRN: [id number] Date of Birth: 1927/03/04   Medicare Important Message Given:  Yes-fourth notification given    Pricilla Handler 04/07/2015, 2:18 PM

## 2015-04-07 NOTE — Progress Notes (Signed)
[facility]                                                                              Patient Demographics  [name], is a 81 y.o. male, DOB - 1926/09/03, SEG:[id number]  Admit date - 03/28/2015   Admitting Physician Donne Hazel, MD  Outpatient Primary MD for the patient is [name], MD  LOS - 10   Subjective:   Sitting on chair, complains about some abdominal soreness. He is also complaining about lower extremity swelling worsening.    Chief Complaint  Patient presents with  . Hypoglycemia  . 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 He had been seen at an urgent care the day prior for epigastric pain.   On EMS arrival to the home, the patient was found to be hypotensive and hypoglycemic which responded to IVF's and dextrose. The patient reported of approximately 48 hours of epigastric pain, malaise and fevers. Initial work up showed temp 101, WBC 22.8, tachycardia, hypotension, AST/ALT 454/521 / bilirubin 6.1, sr cr 2.32, Na 129 and a lactic acid of 4.56. Initial CXR showed a large hiatal hernia but no edema or consolidation. An ultrasound of the abdomen was obtained and revealed an abnormal appearance of the gallbladder with contraction, Ph[place]anh thickening and nodularity without discrete. Follow up CT of the abdomen was assessed and showed an large, non-obstructing hiatal hernia with intrathoracic stomach dilatation, colon diverticulosis, peri-nephric stranding and free fluid in the pelvis. The patient was admitted per [facility]. He was also evaluated by GI and CCS who both feel findings are consistent with cholangitis and sepsis. The patient was volume resuscitated, treated with abx (aztreonam/vanco in ER,  then to levaquin / flagyl), pan cultured and monitored on SDU. Unfortunately, the afternoon of 8/9, he deteriorated and required emergent intubation with transfer to ICU.  STUDIES:/EVENTS 8/08 Admitted with abdominal pain, hypotension. 8/08 [place] ABD >> abnormal appearance of the gallbladder with contraction, [place] thickening and nodularity, no discrete stones, no other acute abnormality. CBD 8.5 mm 8/08 CT ABD/Pelvis >> gaseous distention of the stomach, large hiatal hernia, small high attenuation structure in the distal common bile duct (? Choledocholithiasis or small periampullary duodenum diverticulum), non-specific perinephric stranding, small volume free fluid within the pelvis  03/29/15: intubated. Moved to ICU 03/30/15: Not on pressors. Extubated. Repeated transient asymptomatic hypoglycemia per RN. He has denued abd pain to CCS and GI. CCS suspecting CBD stone with obstruction based on LFT but d/w DR Carlean Purl of GI - he reviewed CT with radiology - CBD stone/dilatation presence not convincing. GI Blood culture growing Gram Negativ Coccobacilli   Assessment & Plan     Severe Sepsis with Escherichia coli bacteremia - Continue IV ciprofloxacin currently stable, not spiking any fevers - ERCP was not successful on 8/15, GI her. ERCP 8/17 a.m. with successful removal of 15  mm impacted CBD stone. -Post ERCP patient tolerating diet okay, continue monitoring at least overnight.   Acute ascending cholangitis Patient probably has acute ascending cholangeitis, according to [facility] guidelines with fever, jaundice and presence of impacted CBD stone Patient does have Escherichia coli bacteremia and CBD impacted stone. Underwent ERCP on 04/06/2015 with successful removal of the impacted CBD stone. Continue antibiotics at least for 14 days.   Atrial fibrillation with RVR: Currently normal sinus rhythm - Patient back in normal sinus rhythm, appreciate cardiology recommendations, transitioned to oral  Cardizem. - Recommendations DC amlodipine outpatient, currently metoprolol on hold. - Patient had an echo on 8/10, showed EF of 46-65%, grade 2 diastolic dysfunction. - Eliquis to be restarted in 48 hours postprocedure per GI recommendation.    Acute hypercarbic respiratory failure - Extubated on 03/30/15, continue as needed albuterol  Acute on chronic kidney disease- Likely worsened due to ATN - Creatinine function plateaued at 3.6, nephrology following  Transaminitis with transient biliary obstruction, cholecystitis, Escherichia coli bacteremia - Currently tolerating diet, general surgery, gastroenterology following - ERCP today  Anemia, thrombocytopenia -Platelets currently improving  Hypokalemia Currently stable   Code Status:Full code   Family Communication: Discussed in detail with the patient, all imaging results, lab results explained to the patient and patient's son on the phone   Disposition Plan:Not medically ready , no changes in the management today   Time Spent in minutes   20 minutes  Procedures   Mechanical ventilation Ultrasound abdomen CT abdomen  Consults   Cardiology  General surgery  GI  CCM  DVT Prophylaxis  SCD's  Medications  Scheduled Meds: . ciprofloxacin  500 mg Oral Daily  . diltiazem  180 mg Oral Daily  . feeding supplement (ENSURE ENLIVE)  237 mL Oral BID BM  . levothyroxine  25 mcg Oral QAC breakfast  . pantoprazole  40 mg Oral Q0600  . pramipexole  1 mg Oral QHS  . sodium chloride  3 mL Intravenous Q12H   Continuous Infusions:   PRN Meds:.albuterol, docusate sodium, metoprolol, polyvinyl alcohol   Antibiotics   Anti-infectives    Start     Dose/Rate Route Frequency Ordered Stop   04/07/15 1300  ciprofloxacin (CIPRO) IVPB 400 mg  Status:  Discontinued     400 mg 200 mL/hr over 60 Minutes Intravenous Every 24 hours 04/06/15 2017 04/07/15 0927   04/07/15 1000  ciprofloxacin (CIPRO) tablet 500 mg     500 mg Oral Daily  04/07/15 0927 04/11/15 0959   04/05/15 2200  ciprofloxacin (CIPRO) IVPB 400 mg  Status:  Discontinued     400 mg 200 mL/hr over 60 Minutes Intravenous Every 24 hours 04/05/15 1150 04/06/15 2017   04/05/15 0000  ciprofloxacin (CIPRO) IVPB 400 mg  Status:  Discontinued     400 mg 200 mL/hr over 60 Minutes Intravenous Every 12 hours 04/04/15 1617 04/05/15 1150   03/31/15 1000  ciprofloxacin (CIPRO) IVPB 400 mg  Status:  Discontinued     400 mg 200 mL/hr over 60 Minutes Intravenous Every 24 hours 03/31/15 0933 04/04/15 1742   03/30/15 1400  vancomycin (VANCOCIN) IVPB 1000 mg/200 mL premix  Status:  Discontinued     1,000 mg 200 mL/hr over 60 Minutes Intravenous Every 48 hours 03/30/15 0751 03/31/15 0933   03/30/15 1300  levofloxacin (LEVAQUIN) IVPB 500 mg  Status:  Discontinued     500 mg 100 mL/hr over 60 Minutes Intravenous Every 48 hours 03/28/15 1407 03/28/15 1645   03/30/15 1230  levofloxacin (LEVAQUIN) IVPB 500 mg  Status:  Discontinued     500 mg 100 mL/hr over 60 Minutes Intravenous Every 48 hours 03/28/15 1707 03/29/15 1619   03/29/15 1500  vancomycin (VANCOCIN) IVPB 750 mg/150 ml premix  Status:  Discontinued     750 mg 150 mL/hr over 60 Minutes Intravenous Every 24 hours 03/28/15 1407 03/28/15 1645   03/29/15 1400  vancomycin (VANCOCIN) IVPB 750 mg/150 ml premix  Status:  Discontinued     750 mg 150 mL/hr over 60 Minutes Intravenous Every 24 hours 03/28/15 1713 03/30/15 0750   03/29/15 1230  levofloxacin (LEVAQUIN) IVPB 750 mg  Status:  Discontinued     750 mg 100 mL/hr over 90 Minutes Intravenous  Once 03/28/15 1656 03/28/15 1707   03/28/15 2100  aztreonam (AZACTAM) 1 g in dextrose 5 % 50 mL IVPB  Status:  Discontinued     1 g 100 mL/hr over 30 Minutes Intravenous Every 8 hours 03/28/15 1407 03/28/15 1645   03/28/15 2100  aztreonam (AZACTAM) 1 g in dextrose 5 % 50 mL IVPB  Status:  Discontinued     1 g 100 mL/hr over 30 Minutes Intravenous 3 times per day 03/28/15 1713  03/31/15 0933   03/28/15 1700  levofloxacin (LEVAQUIN) IVPB 750 mg  Status:  Discontinued     750 mg 100 mL/hr over 90 Minutes Intravenous  Once 03/28/15 1645 03/28/15 1656   03/28/15 1700  metroNIDAZOLE (FLAGYL) IVPB 500 mg  Status:  Discontinued     500 mg 100 mL/hr over 60 Minutes Intravenous  Once 03/28/15 1645 03/28/15 1658   03/28/15 1700  metroNIDAZOLE (FLAGYL) IVPB 500 mg  Status:  Discontinued     500 mg 100 mL/hr over 60 Minutes Intravenous Every 8 hours 03/28/15 1658 03/31/15 0940   03/28/15 1245  vancomycin (VANCOCIN) 1,500 mg in sodium chloride 0.9 % 500 mL IVPB     1,500 mg 250 mL/hr over 120 Minutes Intravenous STAT 03/28/15 1235 03/28/15 1649   03/28/15 1230  levofloxacin (LEVAQUIN) IVPB 750 mg     750 mg 100 mL/hr over 90 Minutes Intravenous  Once 03/28/15 1228 03/28/15 1440   03/28/15 1230  aztreonam (AZACTAM) 2 g in dextrose 5 % 50 mL IVPB     2 g 100 mL/hr over 30 Minutes Intravenous  Once 03/28/15 1228 03/28/15 1354   03/28/15 1230  vancomycin (VANCOCIN) IVPB 1000 mg/200 mL premix  Status:  Discontinued     1,000 mg 200 mL/hr over 60 Minutes Intravenous  Once 03/28/15 1228 03/28/15 1234        Subjective:   [name] was seen and examined today. No complaints, no nausea, vomiting, chest pain, shortness of breath, in normal sinus rhythm. Afebrile. Patient denies dizziness, chest pain, abdominal pain, New weakness, numbess, tingling  Objective:   Blood pressure 112/55, pulse 76, temperature 98 F (36.7 C), temperature source Oral, resp. rate 14, height 5\' 11"  (1.803 m), weight 77.2 kg (170 lb 3.1 oz), SpO2 100 %.  Wt Readings from Last 3 Encounters:  04/07/15 77.2 kg (170 lb 3.1 oz)  02/28/15 72.213 kg (159 lb 3.2 oz)  01/26/15 73.936 kg (163 lb)     Intake/Output Summary (Last 24 hours) at 04/07/15 1538 Last data filed at 04/07/15 1426  Gross per 24 hour  Intake    240 ml  Output   1900 ml  Net  -1660 ml    Exam  General: Alert and oriented x  3, NAD  HEENT:  PERRLA, EOMI,   Neck: Supple, no JVD,  CVS: S1 and S2 clear, RRR  Respiratory: CTAB  Abdomen: Soft, nt, nd, nbs  Ext: no cyanosis clubbing or edema  Neuro: no new deficits  Skin: No rashes  Psych: Normal affect and demeanor, alert and oriented x3    Data Review   Micro Results Recent Results (from the past 240 hour(s))  MRSA PCR Screening     Status: None   Collection Time: 03/29/15  3:18 AM  Result Value Ref Range Status   MRSA by PCR NEGATIVE NEGATIVE Final    Comment:        The GeneXpert MRSA Assay (FDA approved for NASAL specimens only), is one component of a comprehensive MRSA colonization surveillance program. It is not intended to diagnose MRSA infection nor to guide or monitor treatment for MRSA infections.   Culture, respiratory (NON-Expectorated)     Status: None   Collection Time: 03/29/15  4:27 PM  Result Value Ref Range Status   Specimen Description TRACHEAL ASPIRATE  Final   Special Requests NONE  Final   Gram Stain   Final    FEW WBC PRESENT,BOTH PMN AND MONONUCLEAR RARE SQUAMOUS EPITHELIAL CELLS PRESENT RARE GRAM POSITIVE COCCI IN PAIRS Performed at [facility]    Culture   Final    Non-Pathogenic Oropharyngeal-type Flora Isolated. Performed at [facility]    Report Status 03/31/2015 FINAL  Final    Radiology Reports Ct Abdomen Pelvis Wo Contrast  03/28/2015   CLINICAL DATA:  81 year old male with generalized weakness, low blood sugar and epigastric pain  EXAM: CT ABDOMEN AND PELVIS WITHOUT CONTRAST  TECHNIQUE: Multidetector CT imaging of the abdomen and pelvis was performed following the standard protocol without IV contrast.  COMPARISON:  Abdominal ultrasound performed earlier today  FINDINGS: Lower Chest: Massive hiatal hernia. Ingested oral contrast material lays within the partially intra thoracic stomach which is significantly dilated with gas. The heart is within normal limits for size.  Atherosclerotic calcifications present throughout the visualized coronary arteries. No pericardial effusion. Chronic atelectasis in the bilateral lower lungs worse on the left than the right around the large hiatal hernia. Otherwise, the visualized lower lungs are essentially clear. Focal subpleural steatosis adjacent to the right middle lobe.  Abdomen: Unenhanced CT was performed per clinician order. Lack of IV contrast limits sensitivity and specificity, especially for evaluation of abdominal/pelvic solid viscera. Mild gaseous distension of the intra-abdominal portion of the stomach. Unremarkable CT appearance of the spleen, adrenal glands and pancreas. The gallbladder is contracted. There is a mild biliary ductal dilatation. A 6 x 8 mm high attenuation structure either within or immediately adjacent to the distal common bile duct. It is unclear if this represents a small periampullary duodenum diverticulum or choledocholithiasis.  The liver is otherwise normal in size and contour. No definite liver lesion given the limitations of absent IV contrast. Nonspecific perinephric stranding bilaterally. No hydronephrosis. No nephrolithiasis. No free fluid or suspicious adenopathy. No evidence of a bowel obstruction or focal bowel Reaume thickening. Moderate volume of formed stool suggests constipation. Colonic diverticular disease without CT evidence of active inflammation.  Pelvis: The bladder is decompressed. Surgical changes of prior prostatectomy. Trace free fluid layers dependently within the pelvis.  Bones/Soft Tissues: No acute fracture or aggressive appearing lytic or blastic osseous lesion. Advanced multilevel degenerative disc disease. Small left fat containing inguinal hernia. Dextro convex scoliosis of the lumbar spine.  Vascular: Scattered atherosclerotic vascular calcifications without evidence of aneurysm. Otherwise,  evaluation significantly limited in the absence of intravenous contrast.  IMPRESSION: 1. The  patient's mid epigastric pain may be related to gaseous distension of the stomach including the large hiatal hernia and intra thoracic portion of the stomach. As the stomach is incompletely imaged, a component of acute or chronic gastric volvulus is difficult to exclude entirely although is clear there is not complete obstruction given passage of ingested oral contrast material into the distal bowel. 2. Small high attenuation structure in the region of the distal common bile duct may represent choledocholithiasis or a small periampullary duodenum diverticulum. A small duodenum diverticulum is favored. Recommend clinical correlation for evidence of a biliary obstruction or right upper quadrant pain. If clinically warranted, MRCP could further evaluate. 3. Nonspecific perinephric stranding bilaterally. 4. Colonic diverticular disease without CT evidence of active inflammation. 5. Small volume free fluid layers within the pelvis. This is abnormal in a male patient has and is likely secondary to an underlying infectious or inflammatory process. 6. Advanced multilevel degenerative disc disease with dextro convex scoliosis. 7. Scattered atherosclerotic vascular calcifications.   Electronically Signed   By: Jacqulynn Cadet M.D.   On: 03/28/2015 20:15   US Abdomen Complete  03/28/2015   CLINICAL DATA:  Elevated liver function studies, elevated lactate level, epigastric pain  EXAM: ULTRASOUND ABDOMEN COMPLETE  COMPARISON:  Abdominal ultrasound of December 13, 2011  FINDINGS: Gallbladder: The gallbladder is contracted and its Pedraza is irregular. Stones have been described in the past but discrete stones are not observed today.  Common bile duct: Diameter: 8.5 mm  Liver: The liver exhibits no focal mass nor ductal dilation. The surface contour is normal.  IVC: No abnormality visualized.  Pancreas: The pancreas was obscured by bowel gas.  Spleen: Size and appearance within normal limits.  Right Kidney: Length: 10.5 cm.  Echogenicity within normal limits. No mass or hydronephrosis visualized.  Left Kidney: Length: 10.5 cm. Echogenicity within normal limits. No mass or hydronephrosis visualized.  Abdominal aorta: No aneurysm visualized.  Other findings: No ascites is demonstrated.  IMPRESSION: 1. Abnormal appearance of the gallbladder with contraction an Feeley thickening and nodularity. Stones may be present but are not discretely demonstrated. 2. No acute abnormality of the liver, spleen, or kidneys. The pancreas was obscured by bowel gas. 3. Given the patient's laboratory findings and chief complaint, further evaluation with abdominal and pelvic CT scanning would be useful.   Electronically Signed   By: David  Martinique M.D.   On: 03/28/2015 15:53   Mr Abdomen Mrcp Wo Cm  04/03/2015   CLINICAL DATA:  81 year old male with fever and abdominal pain. E coli bacteremia. Acute respiratory failure requiring intubation. Chronic renal dysfunction. Hyperbilirubinemia.  EXAM: MRI ABDOMEN WITHOUT CONTRAST  (INCLUDING MRCP)  TECHNIQUE: Multiplanar multisequence MR imaging of the abdomen was performed. Heavily T2-weighted images of the biliary and pancreatic ducts were obtained, and three-dimensional MRCP images were rendered by post processing.  COMPARISON:  CT of the abdomen and pelvis 03/28/2015.  FINDINGS: Comment: Study is limited for assessment of this visceral and/or vascular lesions by lack of IV gadolinium. Additionally, there is considerable patient respiratory motion.  Lower chest: Massive hiatal hernia. Small to moderate right pleural effusion.  Hepatobiliary: MRCP images are exceedingly limited by patient motion. With these limitations in mind, there is moderate to severe dilatation of the common bile duct and common hepatic duct. Common bile duct measures at least 15 mm in diameter. Mild intrahepatic biliary ductal dilatation is also noted. In the distal  aspect of the common bile duct there is a small signal void, which corresponds  to the suspected calcified stone on prior CT scan 03/28/2015. This likely represents a ductal stone. No discrete hepatic lesions are identified on today's limited examination.  Pancreas: No definite pancreatic mass. No pancreatic ductal dilatation. No peripancreatic fluid or inflammatory changes.  Spleen: Unremarkable.  Adrenals/Urinary Tract: Adrenal glands and kidneys are grossly normal in appearance on today's limited noncontrast examination.  Stomach/Bowel: Massive hiatal hernia with predominantly intrathoracic stomach, and containing a short segment of the transverse colon.  Vascular/Lymphatic: No definite aneurysm in the visualized abdominal arterial vasculature. No lymphadenopathy the visualized portions of the abdomen.  Other: Perinephric fluid bilaterally.  Musculoskeletal: No aggressive osseous lesions noted in the visualized portions of the skeleton.  IMPRESSION: 1. Findings remain concerning for choledocholithiasis with probable stone in the distal common bile duct. This is associated with moderate to severe extrahepatic biliary ductal dilatation and mild intrahepatic biliary ductal dilatation. Correlation with ERCP is recommended, if clinically appropriate. 2. Limited examination with additional findings, as above.   Electronically Signed   By: Vinnie Langton M.D.   On: 04/03/2015 13:19   Mr 3d Recon At Scanner  04/03/2015   CLINICAL DATA:  81 year old male with fever and abdominal pain. E coli bacteremia. Acute respiratory failure requiring intubation. Chronic renal dysfunction. Hyperbilirubinemia.  EXAM: MRI ABDOMEN WITHOUT CONTRAST  (INCLUDING MRCP)  TECHNIQUE: Multiplanar multisequence MR imaging of the abdomen was performed. Heavily T2-weighted images of the biliary and pancreatic ducts were obtained, and three-dimensional MRCP images were rendered by post processing.  COMPARISON:  CT of the abdomen and pelvis 03/28/2015.  FINDINGS: Comment: Study is limited for assessment of this visceral  and/or vascular lesions by lack of IV gadolinium. Additionally, there is considerable patient respiratory motion.  Lower chest: Massive hiatal hernia. Small to moderate right pleural effusion.  Hepatobiliary: MRCP images are exceedingly limited by patient motion. With these limitations in mind, there is moderate to severe dilatation of the common bile duct and common hepatic duct. Common bile duct measures at least 15 mm in diameter. Mild intrahepatic biliary ductal dilatation is also noted. In the distal aspect of the common bile duct there is a small signal void, which corresponds to the suspected calcified stone on prior CT scan 03/28/2015. This likely represents a ductal stone. No discrete hepatic lesions are identified on today's limited examination.  Pancreas: No definite pancreatic mass. No pancreatic ductal dilatation. No peripancreatic fluid or inflammatory changes.  Spleen: Unremarkable.  Adrenals/Urinary Tract: Adrenal glands and kidneys are grossly normal in appearance on today's limited noncontrast examination.  Stomach/Bowel: Massive hiatal hernia with predominantly intrathoracic stomach, and containing a short segment of the transverse colon.  Vascular/Lymphatic: No definite aneurysm in the visualized abdominal arterial vasculature. No lymphadenopathy the visualized portions of the abdomen.  Other: Perinephric fluid bilaterally.  Musculoskeletal: No aggressive osseous lesions noted in the visualized portions of the skeleton.  IMPRESSION: 1. Findings remain concerning for choledocholithiasis with probable stone in the distal common bile duct. This is associated with moderate to severe extrahepatic biliary ductal dilatation and mild intrahepatic biliary ductal dilatation. Correlation with ERCP is recommended, if clinically appropriate. 2. Limited examination with additional findings, as above.   Electronically Signed   By: Vinnie Langton M.D.   On: 04/03/2015 13:19   Dg Chest Port 1  View  03/30/2015   CLINICAL DATA:  Acute respiratory failure.  EXAM: PORTABLE CHEST - 1 VIEW  COMPARISON:  03/29/2015.  FINDINGS: 1116 hours. Endotracheal tube tip is 3.5 cm above the carina. Nasogastric tube projects below the diaphragm, tip not visualized. There is interval improved aeration of the lung bases with mild residual bibasilar atelectasis. No confluent airspace opacity, edema or significant pleural effusion. Cardiomegaly and a large hiatal hernia are again noted post CABG.  IMPRESSION: Interval improved aeration of the lung bases. Stable support system.   Electronically Signed   By: Richardean Sale M.D.   On: 03/30/2015 11:44   Dg Chest Port 1 View  03/29/2015   CLINICAL DATA:  Evaluate endotracheal tube position.  EXAM: PORTABLE CHEST - 1 VIEW  COMPARISON:  03/29/2015.  FINDINGS: Support apparatus: Endotracheal tube 3 cm from the carina. Enteric tube is present with the tip not visible. Side port is in the proximal gastric fundus. Monitoring leads project over the chest.  Cardiomediastinal Silhouette:  Unchanged.  Lungs: Bilateral basilar atelectasis, greater on the LEFT when compared to the RIGHT. No pneumothorax.  Effusions: RIGHT-greater-than-LEFT pleural effusions, likely partially loculated on the RIGHT.  Other:  Large hiatal hernia with intrathoracic gastric air bubble.  IMPRESSION: 1. Endotracheal tube tip 3 cm from the carina. 2. Nasogastric tube in the stomach. 3. Cardiomegaly with bilateral pleural effusions. 4. Large hiatal hernia.   Electronically Signed   By: Dereck Ligas M.D.   On: 03/29/2015 18:40   Dg Chest Port 1 View  03/29/2015   CLINICAL DATA:  Intubation  EXAM: PORTABLE CHEST - 1 VIEW  COMPARISON:  03/28/2015  FINDINGS: Endotracheal tube just above the carina. Recommend withdrawal of 3 cm.  Hypoventilation. Elevated left hemidiaphragm with left lower lobe atelectasis unchanged. Mild right lower lobe atelectasis and small bilateral effusions.  Improved aeration of the lungs  compared with earlier today  IMPRESSION: Endotracheal tube at the carina, recommend withdrawal 3 cm.  Improved aeration in the lung bases. Persistent bibasilar atelectasis has improved. Small bilateral pleural effusions.   Electronically Signed   By: Franchot Gallo M.D.   On: 03/29/2015 14:59   Dg Chest Port 1 View  03/28/2015   CLINICAL DATA:  Shortness of Breath  EXAM: PORTABLE CHEST - 1 VIEW  COMPARISON:  Study obtained earlier in the day  FINDINGS: Large hiatal type hernia is again identified with most of the stomach above the diaphragm. The degree of inspiration shallow. There is no frank edema or consolidation. No appreciable joint effusions. Heart is mildly prominent with pulmonary vascularity within normal limits. Patient is status post coronary artery bypass grafting. No adenopathy.  IMPRESSION: Large hiatal type hernia. No edema or consolidation. Degree of inspiration shallow. No new opacity. No change in cardiac silhouette.   Electronically Signed   By: Lowella Grip III M.D.   On: 03/28/2015 18:36   Dg Chest Portable 1 View  03/28/2015   CLINICAL DATA:  Shortness of breath today  EXAM: PORTABLE CHEST - 1 VIEW  COMPARISON:  03/27/2015  FINDINGS: Cardiac shadow is stable. A large hiatal hernia is again identified. Postsurgical changes are again seen. No focal infiltrate or sizable effusion is noted.  IMPRESSION: Stable large hiatal hernia.  No acute abnormality noted.   Electronically Signed   By: Inez Catalina M.D.   On: 03/28/2015 11:45   Dg Ercp  04/04/2015   CLINICAL DATA:  Jaundice.  EXAM: ERCP  TECHNIQUE: Multiple spot images obtained with the fluoroscopic device and submitted for interpretation post-procedure.  FLUOROSCOPY TIME:  Fluoroscopy Time:  16 minutes and 47 seconds  Number of Acquired Images:  1 image  COMPARISON:  MRI 04/02/2015  FINDINGS: Single ERCP image was obtained. Evidence for a wire in the pancreatic duct. No cannulation or opacification of the biliary system.   IMPRESSION: Common bile duct was not cannulated or opacified on this examination.  These images were submitted for radiologic interpretation only. Please see the procedural report for the amount of contrast and the fluoroscopy time utilized.   Electronically Signed   By: Markus Daft M.D.   On: 04/04/2015 17:43   Dg Ercp Biliary & Pancreatic Ducts  04/06/2015   CLINICAL DATA:  81 year old male with choledocholithiasis  EXAM: ERCP with sphincterotomy  TECHNIQUE: Multiple spot images obtained with the fluoroscopic device and submitted for interpretation post-procedure.  FLUOROSCOPY TIME:  Please see GI operative note for further detail  COMPARISON:  Prior ERCP procedure 04/04/2015  FINDINGS: A total of 12 intraoperative spot images demonstrate a flexible endoscope in the descending duodenum with successful wire cannulation of the common bile duct. Subsequently, sphincterotomy is performed. On cholangiography there is a round filling defect in the distal common bile duct consistent with choledocholithiasis. The images subsequently demonstrate balloon sweeping of the common duct. The filling defect is no longer present on the final images.  IMPRESSION: 1. Successful cannulation of the common bile duct with sphincterotomy and balloon sweep of the common duct. 2. Prior to balloon sweeping, there is a filling defect in the distal common bile duct consistent with choledocholithiasis. 3. On the final images, the filling defect is no longer present. These images were submitted for radiologic interpretation only. Please see the procedural report for the amount of contrast and the fluoroscopy time utilized.   Electronically Signed   By: Jacqulynn Cadet M.D.   On: 04/06/2015 15:52   Dg Abd Portable 1v  03/30/2015   CLINICAL DATA:  Abnormal LFT  EXAM: PORTABLE ABDOMEN - 1 VIEW  COMPARISON:  03/29/2015  FINDINGS: NG tube in the stomach. Normal bowel gas pattern. Contrast in the rectum from prior CT. Surgical clips in the  pelvis.  Lumbar scoliosis and degenerative change.  No renal calculi.  IMPRESSION: Normal bowel gas pattern.  NG tube in the stomach.   Electronically Signed   By: Franchot Gallo M.D.   On: 03/30/2015 11:46   Dg Abd Portable 1v  03/29/2015   CLINICAL DATA:  Check NG tube position  EXAM: PORTABLE ABDOMEN - 1 VIEW  COMPARISON:  03/29/2015 at 1527 hours  FINDINGS: Enteric tube terminates below the diaphragm in the gastric body.  Nonobstructive bowel gas pattern.  Nodule contrast in the distal colon.  IMPRESSION: Enteric tube terminates below the diaphragm in the gastric body.   Electronically Signed   By: Julian Hy M.D.   On: 03/29/2015 16:55   Dg Abd Portable 1v  03/29/2015   CLINICAL DATA:  NG tube placement.  EXAM: PORTABLE ABDOMEN - 1 VIEW  COMPARISON:  CT scan 03/28/2015.  FINDINGS: The NG tube tip is in the lower aspect of the chest. This is likely in the distal esophagus or possibly the large hiatal hernia. The proximal port is in the mid esophagus.  The stomach demonstrates gaseous distention. There is contrast in the bowel.  IMPRESSION: The NG tube tip is in the distal esophagus or large hiatal hernia.   Electronically Signed   By: Marijo Sanes M.D.   On: 03/29/2015 15:55   US Abdomen Limited Ruq  03/31/2015   CLINICAL DATA:  Questionable choledocholithiasis on recent CT  EXAM: US ABDOMEN LIMITED -  RIGHT UPPER QUADRANT  COMPARISON:  Abdominal ultrasound and CT abdomen and pelvis March 28, 2015  FINDINGS: Gallbladder:  The gallbladder is somewhat contracted. There are nonshadowing echogenic foci in the gallbladder similar to 1 day prior. The gallbladder Meunier is upper normal in thickness. There is no pericholecystic fluid. No sonographic Murphy sign noted.  Common bile duct:  Diameter: 6 mm. There is no mass or calculus seen in the visualized portions of the common bile duct. Portions of the common bile duct are obscured by gas.  Liver:  No focal lesion identified. Within normal limits in  parenchymal echogenicity.  IMPRESSION: Note that the study is less than optimal due to patient's inability to suspend respiration significantly. No choledocholithiasis is seen by ultrasound. Portions of the common bile duct, however, are not well visualized due to the patient's inability to suspend respiration and overlying gas. There is upper normal gallbladder Boeder thickness with several non shadowing echogenic foci in the gallbladder. Question non shadowing gallstones versus small foci of sludge.  MRCP would be the optimal study to further evaluate the common bile duct. If patient becomes able to suspend respiration for a significant period of time, consideration may wish to be given to MRCP for further assessment.   Electronically Signed   By: Lowella Grip III M.D.   On: 03/31/2015 02:25    CBC  Recent Labs Lab 04/02/15 0553 04/03/15 0509 04/04/15 0338 04/05/15 0439 04/07/15 0514  WBC 10.5 12.4* 15.1* 15.3* 12.0*  HGB 9.8* 9.8* 9.5* 8.5* 8.1*  HCT 27.8* 27.4* 26.5* 23.9* 23.7*  PLT 94* 109* 140* 147* 261  MCV 86.9 88.1 87.2 88.8 91.9  MCH 30.6 31.5 31.3 31.6 31.4  MCHC 35.3 35.8 35.8 35.6 34.2  RDW 14.4 14.6 14.5 14.8 15.1  LYMPHSABS 0.9 0.6* 1.2 0.9  --   MONOABS 0.9 1.6* 0.9 0.9  --   EOSABS 0.2 0.4 0.3 0.5  --   BASOSABS 0.0 0.0 0.0 0.0  --     Chemistries   Recent Labs Lab 03/31/15 1845  04/03/15 0509 04/03/15 1427 04/04/15 0338 04/05/15 0439 04/06/15 0314 04/07/15 0514  NA 130*  < > 134*  --  133* 130* 131* 133*  K 4.7  < > 3.7  --  3.3* 3.8 3.8 4.6  CL 103  < > 105  --  103 101 103 104  CO2 14*  < > 17*  --  21* 19* 22 19*  GLUCOSE 103*  < > 77  --  102* 56* 105* 87  BUN 50*  < > 58*  --  62* 60* 61* 53*  CREATININE 3.46*  < > 3.63*  --  3.67* 3.63* 3.68* 3.37*  CALCIUM 8.1*  < > 8.6*  --  8.8* 8.2* 8.1* 8.4*  MG 2.1  --   --   --   --   --   --   --   AST  --   < >  --  79* 84* 68* 43* 71*  ALT  --   < >  --  89* 81* 71* 59 69*  ALKPHOS  --   < >  --   256* 257* 263* 234* 306*  BILITOT  --   < >  --  15.7* 16.3* 14.0* 6.3* 5.3*  < > = values in this interval not displayed. ------------------------------------------------------------------------------------------------------------------ estimated creatinine clearance is 16.8 mL/min (by C-G formula based on Cr of 3.37). ------------------------------------------------------------------------------------------------------------------ No results for input(s): HGBA1C in the last 72  hours. ------------------------------------------------------------------------------------------------------------------ No results for input(s): CHOL, HDL, LDLCALC, TRIG, CHOLHDL, LDLDIRECT in the last 72 hours. ------------------------------------------------------------------------------------------------------------------ No results for input(s): TSH, T4TOTAL, T3FREE, THYROIDAB in the last 72 hours.  Invalid input(s): FREET3 ------------------------------------------------------------------------------------------------------------------ No results for input(s): VITAMINB12, FOLATE, FERRITIN, TIBC, IRON, RETICCTPCT in the last 72 hours.  Coagulation profile  Recent Labs Lab 04/02/15 0553  INR 1.37    No results for input(s): DDIMER in the last 72 hours.  Cardiac Enzymes  Recent Labs Lab 04/02/15 0910 04/02/15 1530 04/02/15 2035  TROPONINI 0.58* 0.07* 0.06*   ------------------------------------------------------------------------------------------------------------------ Invalid input(s): POCBNP  No results for input(s): GLUCAP in the last 72 hours.   [facility] A M.D. [facility] 04/07/2015, 3:38 PM  Pager: [phone] Between 7am to 7pm - call Pager - [phone]  After 7pm go to [email or web address] - password [email or web address]  Call night coverage person covering after 7pm

## 2015-04-08 ENCOUNTER — Inpatient Hospital Stay
Admission: RE | Admit: 2015-04-08 | Discharge: 2015-04-28 | Disposition: A | Discharge status: Home / Self Care | Payer: Medicare PPO | Source: Ambulatory Visit | Attending: Internal Medicine | Admitting: Internal Medicine

## 2015-04-08 DIAGNOSIS — R0989 Other specified symptoms and signs involving the circulatory and respiratory systems: Principal | ICD-10-CM

## 2015-04-08 DIAGNOSIS — E785 Hyperlipidemia, unspecified: Secondary | ICD-10-CM

## 2015-04-08 LAB — COMPREHENSIVE METABOLIC PANEL
ALBUMIN: 2.3 g/dL — AB (ref 3.5–5.0)
ALT: 59 U/L (ref 17–63)
AST: 50 U/L — AB (ref 15–41)
Alkaline Phosphatase: 257 U/L — ABNORMAL HIGH (ref 38–126)
Anion gap: 9 (ref 5–15)
BUN: 45 mg/dL — AB (ref 6–20)
CHLORIDE: 106 mmol/L (ref 101–111)
CO2: 22 mmol/L (ref 22–32)
CREATININE: 3.47 mg/dL — AB (ref 0.61–1.24)
Calcium: 8.8 mg/dL — ABNORMAL LOW (ref 8.9–10.3)
GFR calc Af Amer: 17 mL/min — ABNORMAL LOW (ref >60)
GFR, EST NON AFRICAN AMERICAN: 15 mL/min — AB (ref >60)
GLUCOSE: 114 mg/dL — AB (ref 65–99)
Potassium: 3.7 mmol/L (ref 3.5–5.1)
Sodium: 137 mmol/L (ref 135–145)
Total Bilirubin: 4.4 mg/dL — ABNORMAL HIGH (ref 0.3–1.2)
Total Protein: 5.4 g/dL — ABNORMAL LOW (ref 6.5–8.1)

## 2015-04-08 MED ORDER — DILTIAZEM HCL ER COATED BEADS 180 MG PO CP24: 180.0000 mg | ORAL_CAPSULE | Freq: Every day | ORAL | Status: DC

## 2015-04-08 MED ORDER — CIPROFLOXACIN HCL 500 MG PO TABS: 500.0000 mg | ORAL_TABLET | Freq: Every day | ORAL | Status: DC

## 2015-04-08 NOTE — Progress Notes (Signed)
Report called to Anselm Lis at [facility].

## 2015-04-08 NOTE — Progress Notes (Signed)
Assessment:  81 yo male with hx of  PAfib, CAD, HTN, HLD, here with cholecystitis and E coli bacteremia, and AKI.   1. AKI likely sec ischemic ATN from sepsis in face of ACE. Has baseline Crt of 1-1.2, highest 3.7 here, now slowly improving to 3.4 today. UO good. 2. Sepsis 2/2 to cholangitis and Ecoli bacteremia. - improving, LFTs are coming down, remains on cipro.  3. Cholelithiasis and choledocholithiasis - s/p sphincterotomy and lithotripsy - GI on board - rec no cholecystectomy for now  4. A fib- on cardizem. GI ok for eliquis resumption.  5. Mild scrotal edema -  likely from renal impairment, expecting improvement in the near future.   Plan: 1. Anticipate renal fx will continue to recover as he recovers. Needs SCr monitoring outpatient. continue supportive care. 2. Going to rehab today.    Subjective: Interval History: No new complaints. Ab pain is better. Feels better overall.   Objective: Vital signs in last 24 hours: Temp:  [97.9 F (36.6 C)-98.6 F (37 C)] 97.9 F (36.6 C) (08/19 0500) Pulse Rate:  [96-98] 96 (08/19 0500) Resp:  [14-18] 18 (08/19 0500) BP: (112-128)/(55-65) 128/65 mmHg (08/19 0500) SpO2:  [95 %-100 %] 95 % (08/19 0500) Weight:  [170 lb (77.111 kg)] 170 lb (77.111 kg) (08/19 0500) Weight change: 0 lb (0 kg)  Intake/Output from previous day: 08/18 0701 - 08/19 0700 In: 960 [P.O.:960] Out: 2850 [Urine:2850] Intake/Output this shift: Total I/O In: 240 [P.O.:240] Out: 325 [Urine:325]  General appearance: alert and cooperative Head: Normocephalic, without obvious abnormality, atraumatic Resp: clear to auscultation bilaterally Heart: rrr, no m/r/g GI: mild RUQ tenderness Extremities: extremities normal, atraumatic, no cyanosis or edema GU: mild scrotal edema.   Lab Results:  Recent Labs  04/07/15 0514  WBC 12.0*  HGB 8.1*  HCT 23.7*  PLT 261   BMET:   Recent Labs  04/07/15 0514 04/08/15 0850  NA 133* 137  K 4.6 3.7  CL 104 106   CO2 19* 22  GLUCOSE 87 114*  BUN 53* 45*  CREATININE 3.37* 3.47*  CALCIUM 8.4* 8.8*   No results for input(s): PTH in the last 72 hours. Iron Studies: No results for input(s): IRON, TIBC, TRANSFERRIN, FERRITIN in the last 72 hours. Studies/Results: Dg Ercp Biliary & Pancreatic Ducts  04/06/2015   CLINICAL DATA:  81 year old male with choledocholithiasis  EXAM: ERCP with sphincterotomy  TECHNIQUE: Multiple spot images obtained with the fluoroscopic device and submitted for interpretation post-procedure.  FLUOROSCOPY TIME:  Please see GI operative note for further detail  COMPARISON:  Prior ERCP procedure 04/04/2015  FINDINGS: A total of 12 intraoperative spot images demonstrate a flexible endoscope in the descending duodenum with successful wire cannulation of the common bile duct. Subsequently, sphincterotomy is performed. On cholangiography there is a round filling defect in the distal common bile duct consistent with choledocholithiasis. The images subsequently demonstrate balloon sweeping of the common duct. The filling defect is no longer present on the final images.  IMPRESSION: 1. Successful cannulation of the common bile duct with sphincterotomy and balloon sweep of the common duct. 2. Prior to balloon sweeping, there is a filling defect in the distal common bile duct consistent with choledocholithiasis. 3. On the final images, the filling defect is no longer present. These images were submitted for radiologic interpretation only. Please see the procedural report for the amount of contrast and the fluoroscopy time utilized.   Electronically Signed   By: Jacqulynn Cadet M.D.   On: 04/06/2015  15:52    Scheduled: . ciprofloxacin  500 mg Oral Daily  . diltiazem  180 mg Oral Daily  . feeding supplement (ENSURE ENLIVE)  237 mL Oral BID BM  . levothyroxine  25 mcg Oral QAC breakfast  . pantoprazole  40 mg Oral Q0600  . pramipexole  1 mg Oral QHS  . sodium chloride  3 mL Intravenous Q12H      LOS: 11 days   Ahmed, Tasrif 04/08/2015,1:01 PM   Renal Attending: I agree with the note and evaluation as planned above.  I have taken a history today and examined the patient. Sumner Boesch C

## 2015-04-08 NOTE — Clinical Social Work Placement (Signed)
   CLINICAL SOCIAL WORK PLACEMENT  NOTE  Date:  04/08/2015  Patient Details  Name: [name] MRN: [id number] Date of Birth: [birth year]  Clinical Social Work is seeking post-discharge placement for this patient at the [place] level of care (*CSW will initial, date and re-position this form in  chart as items are completed):  Yes   Patient/family provided with [facility] Work Department's list of facilities offering this level of care within the geographic area requested by the patient (or if unable, by the patient's family).  Yes   Patient/family informed of their freedom to choose among providers that offer the needed level of care, that participate in Medicare, Medicaid or managed care program needed by the patient, have an available bed and are willing to accept the patient.  Yes   Patient/family informed of [facility]'s ownership interest in [facility] and [facility], as well as of the fact that they are under no obligation to receive care at these facilities.  PASRR submitted to EDS on       PASRR number received on       Existing PASRR number confirmed on 04/05/15     FL2 transmitted to all facilities in geographic area requested by pt/family on 04/05/15     FL2 transmitted to all facilities within larger geographic area on       Patient informed that his/her managed care company has contracts with or will negotiate with certain facilities, including the following:        Yes   Patient/family informed of bed offers received.  Patient chooses bed at [facility]     Physician recommends and patient chooses bed at      Patient to be transferred to [facility] on 04/08/15.  Patient to be transferred to facility by [facility]     Patient family notified on 04/08/15 of transfer.  Name of family member notified:  Pt notified; declined for CSW to notify family     PHYSICIAN       Additional Comment:     _______________________________________________ Berton Mount, [place]

## 2015-04-08 NOTE — Progress Notes (Signed)
CSW (Clinical Education officer, museum) prepared pt dc packet and placed with shadow chart. CSW arranged non-emergent ambulance transport for 1pm as requested by pt. Pt, pt nurse, and facility informed. Pt declined for CSW to call family and stated he would do this when he reaches the facility. CSW signing off.  [place], [place]

## 2015-04-08 NOTE — Discharge Summary (Signed)
Physician Discharge Summary  [name] EVO:[id number] DOB: 05-05-1927 DOA: 03/28/2015  PCP: [name], MD  Admit date: 03/28/2015 Discharge date: 04/08/2015  Time spent: 40 minutes  Recommendations for Outpatient Follow-up:  1. Follow-up with primary care physician within one week. 2. Discontinued amlodipine, Toprol-XL, HCTZ and lisinopril, started on Cardizem for PAF. 3. Continue ciprofloxacin for 4 more days. 4. Check CMP within one week to ensure resolution of jaundice and improvement of renal function.  Discharge Diagnoses:  Principal Problem:   Sepsis Active Problems:   Hyperlipidemia   Osteoarthritis of right knee   Atrial flutter   Acute respiratory failure   Severe sepsis   Bacteremia due to Gram-negative bacteria   Hiatal hernia   Abnormal LFTs   Biliary sepsis   Abdominal pain, epigastric   Abnormal liver enzymes   Choledocholithiasis   Encounter for nasogastric (NG) tube placement   Hypoxia   Hyperbilirubinemia   Discharge Condition: Stable  Diet recommendation: Heart healthy  Filed Weights   04/06/15 1257 04/07/15 0546 04/08/15 0500  Weight: 77.111 kg (170 lb) 77.2 kg (170 lb 3.1 oz) 77.111 kg (170 lb)    History of present illness:  [name] is a 81 y.o. male  With a hx of prostate cancer, HTN, arthritis, CAD who presented with progressively worsening epigastric abd pain and subjective fevers. In the ED, pt was found to have fevers as high as 101F with WBC of 22.8k, tachycardia, hypotension and lactate over 4. LFT's were also elevated also with bili of 6. Abd [place] with findings of contraction of GB with Verner thickening and nodularity Pt's BP improved with IVF boluses. Pt was started on empiric broad spectrum abx, pan cultures obtained. Hospitalist consulted for consideration for admission.  Hospital Course:    Severe Sepsis with Escherichia coli bacteremia -Presented with fever, tachycardia and lactate over 4. -Started on broad-spectrum and IV  antibiotics and aggressive IV fluid hydration on admission. -ERCP was not successful on 8/15, GI her. ERCP 8/17 a.m. with successful removal of 15 mm impacted CBD stone. -IV ciprofloxacin switched to oral ciprofloxacin one day prior to discharge, patient to complete 14 days of antibiotics.  Acute ascending cholangitis -Patient probably has acute ascending cholangeitis, according to [facility] guidelines with fever, jaundice and presence of impacted CBD stone -Patient does have Escherichia coli bacteremia and CBD impacted stone. -Underwent ERCP on 04/06/2015 with successful removal of the impacted CBD stone. -Continue antibiotics at least for 14 days.   Atrial fibrillation with RVR: Currently normal sinus rhythm - Patient back in normal sinus rhythm, appreciate cardiology recommendations, transitioned to oral Cardizem. - Many changes to medications including discontinuation of amlodipine, Toprol-XL, HCTZ and lisinopril - Patient had an echo on 8/10, showed EF of 29-93%, grade 2 diastolic dysfunction. - On oral Cardizem heart rate controlled, patient is on Eliquis, to be restarted tomorrow according to GI recommendations. - This patients CHA2DS2-VASc Score and unadjusted Ischemic Stroke Rate (% per year) is equal to 4.8 % stroke rate/year from a score of 4 for HTN, vascular issues and age greater than 9 Above score calculated as 1 point each if present [CHF, HTN, DM, Vascular=MI/PAD/Aortic Plaque, Age if 65-74, or Male] Above score calculated as 2 points each if present [Age > 75, or Stroke/TIA/TE]   Acute hypercarbic respiratory failure - Extubated on 03/30/15, continue as needed albuterol, this is secondary to sepsis, resolved.  Acute on chronic kidney disease- Likely worsened due to ATN -Creatinine baseline 1.3 from May 2016, creatinine  went up to 3.37. -Per nephrology note this is likely ATN from sepsis in settings of ACE inhibitors. -Follow closely as outpatient.  Transaminitis with  transient biliary obstruction, cholecystitis, Escherichia coli bacteremia - Currently tolerating diet, general surgery, gastroenterology following - ERCP today  Anemia, thrombocytopenia -Platelets currently improving  Hypokalemia Currently stable   Procedures:  ERCP with removal of large 15 mm CBD stone noted by Dr. Deatra Ina on 04/06/2015  Consultations:  Cardiology.  Gastroenterology.  General surgery  Discharge Exam: Filed Vitals:   04/08/15 0500  BP: 128/65  Pulse: 96  Temp: 97.9 F (36.6 C)  Resp: 18   General: Alert and awake, oriented x3, not in any acute distress. HEENT: anicteric sclera, pupils reactive to light and accommodation, EOMI CVS: S1-S2 clear, no murmur rubs or gallops Chest: clear to auscultation bilaterally, no wheezing, rales or rhonchi Abdomen: soft nontender, nondistended, normal bowel sounds, no organomegaly Extremities: no cyanosis, clubbing, there is +1 edema noted bilaterally Neuro: Cranial nerves II-XII intact, no focal neurological deficits  Discharge Instructions   Discharge Instructions    Diet - low sodium heart healthy    Complete by:  As directed      Increase activity slowly    Complete by:  As directed           Current Discharge Medication List    START taking these medications   Details  ciprofloxacin (CIPRO) 500 MG tablet Take 1 tablet (500 mg total) by mouth daily.    diltiazem (CARDIZEM CD) 180 MG 24 hr capsule Take 1 capsule (180 mg total) by mouth daily.      CONTINUE these medications which have NOT CHANGED   Details  apixaban (ELIQUIS) 2.5 MG TABS tablet Take 1 tablet (2.5 mg total) by mouth 2 (two) times daily. Qty: 60 tablet, Refills: 6    Cholecalciferol (VITAMIN D3) 1000 UNITS CAPS Take 1,000 Units by mouth daily.     levothyroxine (SYNTHROID, LEVOTHROID) 25 MCG tablet Take 1 tablet (25 mcg total) by mouth daily before breakfast. Qty: 30 tablet, Refills: 9    omeprazole (PRILOSEC) 40 MG capsule Take 1  capsule (40 mg total) by mouth daily. Qty: 90 capsule, Refills: 3    oxybutynin (DITROPAN) 5 MG tablet Take 1 tablet (5 mg total) by mouth daily. Qty: 90 tablet, Refills: 3    pramipexole (MIRAPEX) 1 MG tablet Take 1 tablet (1 mg total) by mouth at bedtime. Qty: 90 tablet, Refills: 3    pravastatin (PRAVACHOL) 40 MG tablet Take 1 tablet (40 mg total) by mouth daily. Qty: 90 tablet, Refills: 3    testosterone cypionate (DEPOTESTOTERONE CYPIONATE) 200 MG/ML injection Inject 300 mg into the muscle every 28 (twenty-eight) days.      STOP taking these medications     amLODipine (NORVASC) 5 MG tablet      hydrochlorothiazide (HYDRODIURIL) 25 MG tablet      lisinopril (PRINIVIL,ZESTRIL) 40 MG tablet      metoprolol succinate (TOPROL-XL) 50 MG 24 hr tablet        Allergies  Allergen Reactions  . Lipitor [Atorvastatin] Other (See Comments)    "feel bad"  . Penicillins Itching   Follow-up Information    Follow up with CORNETT,THOMAS A., MD. Schedule an appointment as soon as possible for a visit in 3 weeks.   Specialty:  General Surgery   Why:  Call to arrange a post-hospital follow up with Dr. Brantley Stage.     Contact information:   [address]  [address] [place] [address] [phone]       Follow up with Minus Breeding, MD On 04/26/2015.   Specialty:  Cardiology   Why:  at 11:00AM with Tarri Fuller, PA   Contact information:   [address] [place] [address] [phone]        The results of significant diagnostics from this hospitalization (including imaging, microbiology, ancillary and laboratory) are listed below for reference.    Significant Diagnostic Studies: Ct Abdomen Pelvis Wo Contrast  03/28/2015   CLINICAL DATA:  81 year old male with generalized weakness, low blood sugar and epigastric pain  EXAM: CT ABDOMEN AND PELVIS WITHOUT CONTRAST  TECHNIQUE: Multidetector CT imaging of the abdomen and pelvis was performed following the standard  protocol without IV contrast.  COMPARISON:  Abdominal ultrasound performed earlier today  FINDINGS: Lower Chest: Massive hiatal hernia. Ingested oral contrast material lays within the partially intra thoracic stomach which is significantly dilated with gas. The heart is within normal limits for size. Atherosclerotic calcifications present throughout the visualized coronary arteries. No pericardial effusion. Chronic atelectasis in the bilateral lower lungs worse on the left than the right around the large hiatal hernia. Otherwise, the visualized lower lungs are essentially clear. Focal subpleural steatosis adjacent to the right middle lobe.  Abdomen: Unenhanced CT was performed per clinician order. Lack of IV contrast limits sensitivity and specificity, especially for evaluation of abdominal/pelvic solid viscera. Mild gaseous distension of the intra-abdominal portion of the stomach. Unremarkable CT appearance of the spleen, adrenal glands and pancreas. The gallbladder is contracted. There is a mild biliary ductal dilatation. A 6 x 8 mm high attenuation structure either within or immediately adjacent to the distal common bile duct. It is unclear if this represents a small periampullary duodenum diverticulum or choledocholithiasis.  The liver is otherwise normal in size and contour. No definite liver lesion given the limitations of absent IV contrast. Nonspecific perinephric stranding bilaterally. No hydronephrosis. No nephrolithiasis. No free fluid or suspicious adenopathy. No evidence of a bowel obstruction or focal bowel Duchesne thickening. Moderate volume of formed stool suggests constipation. Colonic diverticular disease without CT evidence of active inflammation.  Pelvis: The bladder is decompressed. Surgical changes of prior prostatectomy. Trace free fluid layers dependently within the pelvis.  Bones/Soft Tissues: No acute fracture or aggressive appearing lytic or blastic osseous lesion. Advanced multilevel  degenerative disc disease. Small left fat containing inguinal hernia. Dextro convex scoliosis of the lumbar spine.  Vascular: Scattered atherosclerotic vascular calcifications without evidence of aneurysm. Otherwise, evaluation significantly limited in the absence of intravenous contrast.  IMPRESSION: 1. The patient's mid epigastric pain may be related to gaseous distension of the stomach including the large hiatal hernia and intra thoracic portion of the stomach. As the stomach is incompletely imaged, a component of acute or chronic gastric volvulus is difficult to exclude entirely although is clear there is not complete obstruction given passage of ingested oral contrast material into the distal bowel. 2. Small high attenuation structure in the region of the distal common bile duct may represent choledocholithiasis or a small periampullary duodenum diverticulum. A small duodenum diverticulum is favored. Recommend clinical correlation for evidence of a biliary obstruction or right upper quadrant pain. If clinically warranted, MRCP could further evaluate. 3. Nonspecific perinephric stranding bilaterally. 4. Colonic diverticular disease without CT evidence of active inflammation. 5. Small volume free fluid layers within the pelvis. This is abnormal in a male patient has and is likely secondary to an underlying infectious or inflammatory  process. 6. Advanced multilevel degenerative disc disease with dextro convex scoliosis. 7. Scattered atherosclerotic vascular calcifications.   Electronically Signed   By: Jacqulynn Cadet M.D.   On: 03/28/2015 20:15   US Abdomen Complete  03/28/2015   CLINICAL DATA:  Elevated liver function studies, elevated lactate level, epigastric pain  EXAM: ULTRASOUND ABDOMEN COMPLETE  COMPARISON:  Abdominal ultrasound of December 13, 2011  FINDINGS: Gallbladder: The gallbladder is contracted and its Fl[place] is irregular. Stones have been described in the past but discrete stones are not observed  today.  Common bile duct: Diameter: 8.5 mm  Liver: The liver exhibits no focal mass nor ductal dilation. The surface contour is normal.  IVC: No abnormality visualized.  Pancreas: The pancreas was obscured by bowel gas.  Spleen: Size and appearance within normal limits.  Right Kidney: Length: 10.5 cm. Echogenicity within normal limits. No mass or hydronephrosis visualized.  Left Kidney: Length: 10.5 cm. Echogenicity within normal limits. No mass or hydronephrosis visualized.  Abdominal aorta: No aneurysm visualized.  Other findings: No ascites is demonstrated.  IMPRESSION: 1. Abnormal appearance of the gallbladder with contraction an Graber thickening and nodularity. Stones may be present but are not discretely demonstrated. 2. No acute abnormality of the liver, spleen, or kidneys. The pancreas was obscured by bowel gas. 3. Given the patient's laboratory findings and chief complaint, further evaluation with abdominal and pelvic CT scanning would be useful.   Electronically Signed   By: David  Martinique M.D.   On: 03/28/2015 15:53   Mr Abdomen Mrcp Wo Cm  04/03/2015   CLINICAL DATA:  81 year old male with fever and abdominal pain. E coli bacteremia. Acute respiratory failure requiring intubation. Chronic renal dysfunction. Hyperbilirubinemia.  EXAM: MRI ABDOMEN WITHOUT CONTRAST  (INCLUDING MRCP)  TECHNIQUE: Multiplanar multisequence MR imaging of the abdomen was performed. Heavily T2-weighted images of the biliary and pancreatic ducts were obtained, and three-dimensional MRCP images were rendered by post processing.  COMPARISON:  CT of the abdomen and pelvis 03/28/2015.  FINDINGS: Comment: Study is limited for assessment of this visceral and/or vascular lesions by lack of IV gadolinium. Additionally, there is considerable patient respiratory motion.  Lower chest: Massive hiatal hernia. Small to moderate right pleural effusion.  Hepatobiliary: MRCP images are exceedingly limited by patient motion. With these limitations  in mind, there is moderate to severe dilatation of the common bile duct and common hepatic duct. Common bile duct measures at least 15 mm in diameter. Mild intrahepatic biliary ductal dilatation is also noted. In the distal aspect of the common bile duct there is a small signal void, which corresponds to the suspected calcified stone on prior CT scan 03/28/2015. This likely represents a ductal stone. No discrete hepatic lesions are identified on today's limited examination.  Pancreas: No definite pancreatic mass. No pancreatic ductal dilatation. No peripancreatic fluid or inflammatory changes.  Spleen: Unremarkable.  Adrenals/Urinary Tract: Adrenal glands and kidneys are grossly normal in appearance on today's limited noncontrast examination.  Stomach/Bowel: Massive hiatal hernia with predominantly intrathoracic stomach, and containing a short segment of the transverse colon.  Vascular/Lymphatic: No definite aneurysm in the visualized abdominal arterial vasculature. No lymphadenopathy the visualized portions of the abdomen.  Other: Perinephric fluid bilaterally.  Musculoskeletal: No aggressive osseous lesions noted in the visualized portions of the skeleton.  IMPRESSION: 1. Findings remain concerning for choledocholithiasis with probable stone in the distal common bile duct. This is associated with moderate to severe extrahepatic biliary ductal dilatation and mild intrahepatic biliary ductal dilatation. Correlation with  ERCP is recommended, if clinically appropriate. 2. Limited examination with additional findings, as above.   Electronically Signed   By: Vinnie Langton M.D.   On: 04/03/2015 13:19   Mr 3d Recon At Scanner  04/03/2015   CLINICAL DATA:  81 year old male with fever and abdominal pain. E coli bacteremia. Acute respiratory failure requiring intubation. Chronic renal dysfunction. Hyperbilirubinemia.  EXAM: MRI ABDOMEN WITHOUT CONTRAST  (INCLUDING MRCP)  TECHNIQUE: Multiplanar multisequence MR imaging  of the abdomen was performed. Heavily T2-weighted images of the biliary and pancreatic ducts were obtained, and three-dimensional MRCP images were rendered by post processing.  COMPARISON:  CT of the abdomen and pelvis 03/28/2015.  FINDINGS: Comment: Study is limited for assessment of this visceral and/or vascular lesions by lack of IV gadolinium. Additionally, there is considerable patient respiratory motion.  Lower chest: Massive hiatal hernia. Small to moderate right pleural effusion.  Hepatobiliary: MRCP images are exceedingly limited by patient motion. With these limitations in mind, there is moderate to severe dilatation of the common bile duct and common hepatic duct. Common bile duct measures at least 15 mm in diameter. Mild intrahepatic biliary ductal dilatation is also noted. In the distal aspect of the common bile duct there is a small signal void, which corresponds to the suspected calcified stone on prior CT scan 03/28/2015. This likely represents a ductal stone. No discrete hepatic lesions are identified on today's limited examination.  Pancreas: No definite pancreatic mass. No pancreatic ductal dilatation. No peripancreatic fluid or inflammatory changes.  Spleen: Unremarkable.  Adrenals/Urinary Tract: Adrenal glands and kidneys are grossly normal in appearance on today's limited noncontrast examination.  Stomach/Bowel: Massive hiatal hernia with predominantly intrathoracic stomach, and containing a short segment of the transverse colon.  Vascular/Lymphatic: No definite aneurysm in the visualized abdominal arterial vasculature. No lymphadenopathy the visualized portions of the abdomen.  Other: Perinephric fluid bilaterally.  Musculoskeletal: No aggressive osseous lesions noted in the visualized portions of the skeleton.  IMPRESSION: 1. Findings remain concerning for choledocholithiasis with probable stone in the distal common bile duct. This is associated with moderate to severe extrahepatic biliary  ductal dilatation and mild intrahepatic biliary ductal dilatation. Correlation with ERCP is recommended, if clinically appropriate. 2. Limited examination with additional findings, as above.   Electronically Signed   By: Vinnie Langton M.D.   On: 04/03/2015 13:19   Dg Chest Port 1 View  03/30/2015   CLINICAL DATA:  Acute respiratory failure.  EXAM: PORTABLE CHEST - 1 VIEW  COMPARISON:  03/29/2015.  FINDINGS: 1116 hours. Endotracheal tube tip is 3.5 cm above the carina. Nasogastric tube projects below the diaphragm, tip not visualized. There is interval improved aeration of the lung bases with mild residual bibasilar atelectasis. No confluent airspace opacity, edema or significant pleural effusion. Cardiomegaly and a large hiatal hernia are again noted post CABG.  IMPRESSION: Interval improved aeration of the lung bases. Stable support system.   Electronically Signed   By: Richardean Sale M.D.   On: 03/30/2015 11:44   Dg Chest Port 1 View  03/29/2015   CLINICAL DATA:  Evaluate endotracheal tube position.  EXAM: PORTABLE CHEST - 1 VIEW  COMPARISON:  03/29/2015.  FINDINGS: Support apparatus: Endotracheal tube 3 cm from the carina. Enteric tube is present with the tip not visible. Side port is in the proximal gastric fundus. Monitoring leads project over the chest.  Cardiomediastinal Silhouette:  Unchanged.  Lungs: Bilateral basilar atelectasis, greater on the LEFT when compared to the RIGHT. No pneumothorax.  Effusions:  RIGHT-greater-than-LEFT pleural effusions, likely partially loculated on the RIGHT.  Other:  Large hiatal hernia with intrathoracic gastric air bubble.  IMPRESSION: 1. Endotracheal tube tip 3 cm from the carina. 2. Nasogastric tube in the stomach. 3. Cardiomegaly with bilateral pleural effusions. 4. Large hiatal hernia.   Electronically Signed   By: Dereck Ligas M.D.   On: 03/29/2015 18:40   Dg Chest Port 1 View  03/29/2015   CLINICAL DATA:  Intubation  EXAM: PORTABLE CHEST - 1 VIEW   COMPARISON:  03/28/2015  FINDINGS: Endotracheal tube just above the carina. Recommend withdrawal of 3 cm.  Hypoventilation. Elevated left hemidiaphragm with left lower lobe atelectasis unchanged. Mild right lower lobe atelectasis and small bilateral effusions.  Improved aeration of the lungs compared with earlier today  IMPRESSION: Endotracheal tube at the carina, recommend withdrawal 3 cm.  Improved aeration in the lung bases. Persistent bibasilar atelectasis has improved. Small bilateral pleural effusions.   Electronically Signed   By: Franchot Gallo M.D.   On: 03/29/2015 14:59   Dg Chest Port 1 View  03/28/2015   CLINICAL DATA:  Shortness of Breath  EXAM: PORTABLE CHEST - 1 VIEW  COMPARISON:  Study obtained earlier in the day  FINDINGS: Large hiatal type hernia is again identified with most of the stomach above the diaphragm. The degree of inspiration shallow. There is no frank edema or consolidation. No appreciable joint effusions. Heart is mildly prominent with pulmonary vascularity within normal limits. Patient is status post coronary artery bypass grafting. No adenopathy.  IMPRESSION: Large hiatal type hernia. No edema or consolidation. Degree of inspiration shallow. No new opacity. No change in cardiac silhouette.   Electronically Signed   By: Lowella Grip III M.D.   On: 03/28/2015 18:36   Dg Chest Portable 1 View  03/28/2015   CLINICAL DATA:  Shortness of breath today  EXAM: PORTABLE CHEST - 1 VIEW  COMPARISON:  03/27/2015  FINDINGS: Cardiac shadow is stable. A large hiatal hernia is again identified. Postsurgical changes are again seen. No focal infiltrate or sizable effusion is noted.  IMPRESSION: Stable large hiatal hernia.  No acute abnormality noted.   Electronically Signed   By: Inez Catalina M.D.   On: 03/28/2015 11:45   Dg Ercp  04/04/2015   CLINICAL DATA:  Jaundice.  EXAM: ERCP  TECHNIQUE: Multiple spot images obtained with the fluoroscopic device and submitted for interpretation  post-procedure.  FLUOROSCOPY TIME:  Fluoroscopy Time:  16 minutes and 47 seconds  Number of Acquired Images:  1 image  COMPARISON:  MRI 04/02/2015  FINDINGS: Single ERCP image was obtained. Evidence for a wire in the pancreatic duct. No cannulation or opacification of the biliary system.  IMPRESSION: Common bile duct was not cannulated or opacified on this examination.  These images were submitted for radiologic interpretation only. Please see the procedural report for the amount of contrast and the fluoroscopy time utilized.   Electronically Signed   By: Markus Daft M.D.   On: 04/04/2015 17:43   Dg Ercp Biliary & Pancreatic Ducts  04/06/2015   CLINICAL DATA:  81 year old male with choledocholithiasis  EXAM: ERCP with sphincterotomy  TECHNIQUE: Multiple spot images obtained with the fluoroscopic device and submitted for interpretation post-procedure.  FLUOROSCOPY TIME:  Please see GI operative note for further detail  COMPARISON:  Prior ERCP procedure 04/04/2015  FINDINGS: A total of 12 intraoperative spot images demonstrate a flexible endoscope in the descending duodenum with successful wire cannulation of the common bile duct. Subsequently,  sphincterotomy is performed. On cholangiography there is a round filling defect in the distal common bile duct consistent with choledocholithiasis. The images subsequently demonstrate balloon sweeping of the common duct. The filling defect is no longer present on the final images.  IMPRESSION: 1. Successful cannulation of the common bile duct with sphincterotomy and balloon sweep of the common duct. 2. Prior to balloon sweeping, there is a filling defect in the distal common bile duct consistent with choledocholithiasis. 3. On the final images, the filling defect is no longer present. These images were submitted for radiologic interpretation only. Please see the procedural report for the amount of contrast and the fluoroscopy time utilized.   Electronically Signed   By: Jacqulynn Cadet M.D.   On: 04/06/2015 15:52   Dg Abd Portable 1v  03/30/2015   CLINICAL DATA:  Abnormal LFT  EXAM: PORTABLE ABDOMEN - 1 VIEW  COMPARISON:  03/29/2015  FINDINGS: NG tube in the stomach. Normal bowel gas pattern. Contrast in the rectum from prior CT. Surgical clips in the pelvis.  Lumbar scoliosis and degenerative change.  No renal calculi.  IMPRESSION: Normal bowel gas pattern.  NG tube in the stomach.   Electronically Signed   By: Franchot Gallo M.D.   On: 03/30/2015 11:46   Dg Abd Portable 1v  03/29/2015   CLINICAL DATA:  Check NG tube position  EXAM: PORTABLE ABDOMEN - 1 VIEW  COMPARISON:  03/29/2015 at 1527 hours  FINDINGS: Enteric tube terminates below the diaphragm in the gastric body.  Nonobstructive bowel gas pattern.  Nodule contrast in the distal colon.  IMPRESSION: Enteric tube terminates below the diaphragm in the gastric body.   Electronically Signed   By: Julian Hy M.D.   On: 03/29/2015 16:55   Dg Abd Portable 1v  03/29/2015   CLINICAL DATA:  NG tube placement.  EXAM: PORTABLE ABDOMEN - 1 VIEW  COMPARISON:  CT scan 03/28/2015.  FINDINGS: The NG tube tip is in the lower aspect of the chest. This is likely in the distal esophagus or possibly the large hiatal hernia. The proximal port is in the mid esophagus.  The stomach demonstrates gaseous distention. There is contrast in the bowel.  IMPRESSION: The NG tube tip is in the distal esophagus or large hiatal hernia.   Electronically Signed   By: Marijo Sanes M.D.   On: 03/29/2015 15:55   US Abdomen Limited Ruq  03/31/2015   CLINICAL DATA:  Questionable choledocholithiasis on recent CT  EXAM: US ABDOMEN LIMITED - RIGHT UPPER QUADRANT  COMPARISON:  Abdominal ultrasound and CT abdomen and pelvis March 28, 2015  FINDINGS: Gallbladder:  The gallbladder is somewhat contracted. There are nonshadowing echogenic foci in the gallbladder similar to 1 day prior. The gallbladder Bischoff is upper normal in thickness. There is no pericholecystic  fluid. No sonographic Murphy sign noted.  Common bile duct:  Diameter: 6 mm. There is no mass or calculus seen in the visualized portions of the common bile duct. Portions of the common bile duct are obscured by gas.  Liver:  No focal lesion identified. Within normal limits in parenchymal echogenicity.  IMPRESSION: Note that the study is less than optimal due to patient's inability to suspend respiration significantly. No choledocholithiasis is seen by ultrasound. Portions of the common bile duct, however, are not well visualized due to the patient's inability to suspend respiration and overlying gas. There is upper normal gallbladder Ruz thickness with several non shadowing echogenic foci in the gallbladder. Question non  shadowing gallstones versus small foci of sludge.  MRCP would be the optimal study to further evaluate the common bile duct. If patient becomes able to suspend respiration for a significant period of time, consideration may wish to be given to MRCP for further assessment.   Electronically Signed   By: Lowella Grip III M.D.   On: 03/31/2015 02:25    Microbiology: Recent Results (from the past 240 hour(s))  Culture, respiratory (NON-Expectorated)     Status: None   Collection Time: 03/29/15  4:27 PM  Result Value Ref Range Status   Specimen Description TRACHEAL ASPIRATE  Final   Special Requests NONE  Final   Gram Stain   Final    FEW WBC PRESENT,BOTH PMN AND MONONUCLEAR RARE SQUAMOUS EPITHELIAL CELLS PRESENT RARE GRAM POSITIVE COCCI IN PAIRS Performed at [facility]    Culture   Final    Non-Pathogenic Oropharyngeal-type Flora Isolated. Performed at [facility]    Report Status 03/31/2015 FINAL  Final     Labs: Basic Metabolic Panel:  Recent Labs Lab 04/04/15 0338 04/05/15 0439 04/06/15 0314 04/07/15 0514 04/08/15 0850  NA 133* 130* 131* 133* 137  K 3.3* 3.8 3.8 4.6 3.7  CL 103 101 103 104 106  CO2 21* 19* 22 19* 22  GLUCOSE 102* 56* 105*  87 114*  BUN 62* 60* 61* 53* 45*  CREATININE 3.67* 3.63* 3.68* 3.37* 3.47*  CALCIUM 8.8* 8.2* 8.1* 8.4* 8.8*   Liver Function Tests:  Recent Labs Lab 04/03/15 1427 04/04/15 0338 04/05/15 0439 04/06/15 0314 04/07/15 0514  AST 79* 84* 68* 43* 71*  ALT 89* 81* 71* 59 69*  ALKPHOS 256* 257* 263* 234* 306*  BILITOT 15.7* 16.3* 14.0* 6.3* 5.3*  PROT 5.2* 4.9* 4.7* 4.8* 4.7*  ALBUMIN 2.2* 2.0* 1.9* 2.0* 2.1*   No results for input(s): LIPASE, AMYLASE in the last 168 hours. No results for input(s): AMMONIA in the last 168 hours. CBC:  Recent Labs Lab 04/02/15 0553 04/03/15 0509 04/04/15 0338 04/05/15 0439 04/07/15 0514  WBC 10.5 12.4* 15.1* 15.3* 12.0*  NEUTROABS 8.5* 9.8* 12.7* 13.0*  --   HGB 9.8* 9.8* 9.5* 8.5* 8.1*  HCT 27.8* 27.4* 26.5* 23.9* 23.7*  MCV 86.9 88.1 87.2 88.8 91.9  PLT 94* 109* 140* 147* 261   Cardiac Enzymes:  Recent Labs Lab 04/02/15 0910 04/02/15 1530 04/02/15 2035  TROPONINI 0.58* 0.07* 0.06*   BNP: BNP (last 3 results)  Recent Labs  03/28/15 1200  BNP 1040.3*    ProBNP (last 3 results) No results for input(s): PROBNP in the last 8760 hours.  CBG: No results for input(s): GLUCAP in the last 168 hours.     Signed:  Elizabethann Lackey A  [facility] 04/08/2015, 10:36 AM

## 2015-04-09 ENCOUNTER — Non-Acute Institutional Stay (SKILLED_NURSING_FACILITY): Payer: Medicare PPO | Admitting: Internal Medicine

## 2015-04-09 ENCOUNTER — Encounter (HOSPITAL_COMMUNITY)
Admission: RE | Admit: 2015-04-09 | Discharge: 2015-04-09 | Disposition: A | Discharge status: Home / Self Care | Payer: Medicare PPO | Source: Skilled Nursing Facility | Attending: Internal Medicine | Admitting: Internal Medicine

## 2015-04-09 DIAGNOSIS — N170 Acute kidney failure with tubular necrosis: Secondary | ICD-10-CM | POA: Diagnosis not present

## 2015-04-09 DIAGNOSIS — K8309 Other cholangitis: Secondary | ICD-10-CM

## 2015-04-09 DIAGNOSIS — I482 Chronic atrial fibrillation, unspecified: Secondary | ICD-10-CM

## 2015-04-09 DIAGNOSIS — A4151 Sepsis due to Escherichia coli [E. coli]: Secondary | ICD-10-CM | POA: Diagnosis not present

## 2015-04-09 DIAGNOSIS — K830 Cholangitis: Secondary | ICD-10-CM

## 2015-04-09 LAB — CBC WITH DIFFERENTIAL/PLATELET
Basophils Absolute: 0 10*3/uL (ref 0.0–0.1)
Basophils Relative: 0 % (ref 0–1)
EOS ABS: 0.2 10*3/uL (ref 0.0–0.7)
EOS PCT: 2 % (ref 0–5)
HCT: 21.8 % — ABNORMAL LOW (ref 39.0–52.0)
Hemoglobin: 7.5 g/dL — ABNORMAL LOW (ref 13.0–17.0)
LYMPHS ABS: 0.8 10*3/uL (ref 0.7–4.0)
LYMPHS PCT: 9 % — AB (ref 12–46)
MCH: 32.6 pg (ref 26.0–34.0)
MCHC: 34.4 g/dL (ref 30.0–36.0)
MCV: 94.8 fL (ref 78.0–100.0)
MONO ABS: 0.8 10*3/uL (ref 0.1–1.0)
MONOS PCT: 9 % (ref 3–12)
Neutro Abs: 7.3 10*3/uL (ref 1.7–7.7)
Neutrophils Relative %: 81 % — ABNORMAL HIGH (ref 43–77)
PLATELETS: 311 10*3/uL (ref 150–400)
RBC: 2.3 MIL/uL — ABNORMAL LOW (ref 4.22–5.81)
RDW: 15.7 % — AB (ref 11.5–15.5)
WBC: 9.1 10*3/uL (ref 4.0–10.5)

## 2015-04-09 LAB — COMPREHENSIVE METABOLIC PANEL
ALT: 49 U/L (ref 17–63)
ANION GAP: 7 (ref 5–15)
AST: 40 U/L (ref 15–41)
Albumin: 2.3 g/dL — ABNORMAL LOW (ref 3.5–5.0)
Alkaline Phosphatase: 186 U/L — ABNORMAL HIGH (ref 38–126)
BUN: 53 mg/dL — ABNORMAL HIGH (ref 6–20)
CHLORIDE: 110 mmol/L (ref 101–111)
CO2: 23 mmol/L (ref 22–32)
CREATININE: 3.34 mg/dL — AB (ref 0.61–1.24)
Calcium: 8.7 mg/dL — ABNORMAL LOW (ref 8.9–10.3)
GFR, EST AFRICAN AMERICAN: 18 mL/min — AB (ref >60)
GFR, EST NON AFRICAN AMERICAN: 15 mL/min — AB (ref >60)
Glucose, Bld: 82 mg/dL (ref 65–99)
Potassium: 4.1 mmol/L (ref 3.5–5.1)
SODIUM: 140 mmol/L (ref 135–145)
Total Bilirubin: 3.9 mg/dL — ABNORMAL HIGH (ref 0.3–1.2)
Total Protein: 5.1 g/dL — ABNORMAL LOW (ref 6.5–8.1)

## 2015-04-09 LAB — URINE MICROSCOPIC-ADD ON

## 2015-04-09 LAB — URINALYSIS, ROUTINE W REFLEX MICROSCOPIC
BILIRUBIN URINE: NEGATIVE
Glucose, UA: NEGATIVE mg/dL
Ketones, ur: NEGATIVE mg/dL
Leukocytes, UA: NEGATIVE
NITRITE: NEGATIVE
PROTEIN: 30 mg/dL — AB
SPECIFIC GRAVITY, URINE: 1.01 (ref 1.005–1.030)
UROBILINOGEN UA: 0.2 mg/dL (ref 0.0–1.0)
pH: 6 (ref 5.0–8.0)

## 2015-04-11 NOTE — Progress Notes (Addendum)
Patient ID: [name], male   DOB: [birth year], 81 y.o.   MRN: [id number]                HISTORY & PHYSICAL  DATE:  04/09/2015          FACILITY: [facility]                       LEVEL OF CARE:   SNF   CHIEF COMPLAINT:  Admission to SNF, post stay at [facility], 03/28/2015 through 04/08/2015.       HISTORY OF PRESENT ILLNESS:  This is an 81 year-old man who lives independently in [place].  States he was well until shortly before his transfer to hospital with progressively worsening epigastric pain and fevers.  In the emergency room, he was found to have a temperature of 101 and a white count of 22.8.  He was hypotensive with a lactate of over 4.  LFTs were also elevated with a bili of 6.  He was found to have a contracted gallbladder.   He was felt to have ascending cholangitis with fever, jaundice.  He had an impacted common bile duct stone and, with a second attempt at ERCP, this was extracted.  He was noted to have E.coli bacteremia, treated with ciprofloxacin.    Other medical issues in the hospital included:    Atrial fibrillation with rapid ventricular response.    Acute hypercarbic respiratory failure requiring intubation.   Extubated on 03/30/2015.    Acute on chronic renal failure.  Felt by Nephrology to represent ATN in the setting of ACE inhibitors.  His discharge creatinine was 3.47.  It was 1.3 in May 2016.  His liver function tests are improving.    Anemia and thrombocytopenia.  Platelet count was improving.  He was discharged with a hemoglobin of 8.1, down to 7.5 today.  His discharge white count was 12, down to 9.1 today.  Platelets at 311,000.   His hemoglobin was normal in May 2016 at 14.3.    PAST MEDICAL HISTORY/PROBLEM LIST:                      Severe sepsis with E.coli.    Choledocholethiasis with ascending cholangitis.  Currently improving.     Acute renal failure secondary to ATN.  Discharged with a creatinine of 3.47.    History of hiatal  hernia.    Hypoxemic and hypercarbic respiratory failure.  Currently stable.    History of prostate cancer.     History of CABG x4.    Right knee osteoarthritis.  Status post total knee.    Left inguinal hernia.    History of atrial fibrillation/atrial flutter.    Hypothyroidism.      PAST SURGICAL HISTORY:             Hernia repair.    Prostate surgery.    Coronary artery bypass graft in 2015.    Total knee arthroplasty on the right in 2015.    Right thoracotomy as a child for pneumonia.    EGD in 2013 for a 11 cm sliding hiatal hernia.    ERCP x2.    CURRENT MEDICATIONS:  Medication list is reviewed.                         Ciprofloxacin 500 b.i.d. for a total of two weeks.    Diltiazem CD 180 q.d.  Eliquis 2.5 b.i.d.      Vitamin D3, 1000 a day.    Synthroid 25 daily.        Prilosec 40 q.d.      Ditropan 5 q.d.     Mirapex 1 tablet at bedtime, restless legs syndrome.    Pravachol 40 q.d.     Testosterone 300 monthly.    Discontinued medications include:    Norvasc.    Hydrochlorothiazide.    Lisinopril.    Metoprolol XL.    SOCIAL HISTORY:                   HOUSING:  The patient lives in [place].  Lives alone.      FUNCTIONAL STATUS:  Independent with ADLs and IADLs.  No ambulatory assist device.   TOBACCO USE:  The patient is a former smoker.   ALCOHOL:  No alcohol use.    FAMILY HISTORY:           MOTHER:  History of cancer.   Deceased.    FATHER:  Deceased.     REVIEW OF SYSTEMS:            GENERAL:  No weight loss before he became ill.   HEENT:  No visual complaints.   CHEST/RESPIRATORY:  Occasional shortness of breath and dry cough.   CARDIAC:  No current complaints of chest pain or palpitations.   GI:  He is not having abdominal pain, nausea or vomiting.  No dysphagia.  Bowel movements are formed.   GU:  He is complaining of swelling of his penis and urinary frequency.   EDEMA/VARICOSITIES:  Extremities:  He is not  complaining of edema, stating this is much better in his legs.   NEUROLOGICAL:  No complaints of focal weakness or numbness.   PSYCHIATRIC:   States he has no depression.  SKIN; no concernes   PHYSICAL EXAMINATION:   VITAL SIGNS:     PULSE:  100, in atrial fibrillation.   RESPIRATIONS:  18 and unlabored.   02 SATURATIONS:  95% on room air.   GENERAL APPEARANCE:  The patient is not in any distress.  He is alert, conversational, and able to give his own history.          HEENT:   MOUTH/THROAT:    He has angular chelosis on the right side.  Oral exam is otherwise normal.   EYES:  Eyes look normal.   NECK/THYROID:  No palpable thyroid or lymphadenopathy.   LYMPHATICS:  None palpable in the cervical, clavicular, or axillary areas.   CHEST/RESPIRATORY:  Clear air entry bilaterally.  No crackles or wheezes.   CARDIOVASCULAR:   CARDIAC:  Atrial fibrillation.  There is a 3/6 systolic ejection murmur, maximal at the aortic area.  Left parasternal lift.  His JVP is not elevated.     GASTROINTESTINAL:   ABDOMEN:  No masses.  Absolutely no tenderness is noted.   LIVER/SPLEEN/KIDNEY:   No liver, no spleen. No masses GENITOURINARY:   BLADDER:  Not distended.  There is no CVA tenderness.   PHALLUS:    The patient is circumcised.   He has swelling of the entire penis.  However, the head of the penis looks stable.   RECTAL:  Deferred.    CIRCULATION:   EDEMA/VARICOSITIES:  Extremities:  There is minimal edema.   VASCULAR:   ARTERIAL:  Peripheral pulses are palpable.    NEUROLOGICAL:   SENSATION/STRENGTH:  4/5 strength.   DEEP TENDON REFLEXES:  Reflexes  are symmetric.  Toes are downgoing.   PSYCHIATRIC:   MENTAL STATUS:    Bright, alert.  Complaining about his neighbor not allowing him to sleep at night.  Otherwise, I see no problems here.    ASSESSMENT/PLAN:                      E.coli sepsis.  Secondary to an impacted common bile duct stone.  He is completing Cipro, which I will continue for  another week.    Ascending cholangitis.  His liver function tests are normalized.  His alk phos is down to 186.  AST and ALT are normal at 3 and 4.9, respectively.  Total bilirubin down to 3.9.  This is gradually decreasing.  It was 6.3 on 04/06/2015.    Acute on chronic renal failure.  Secondary to ATN related to sepsis.  His discharge creatinine was 3.47.  It is 3.34 today.  BUN is 53, potassium 4.1, CO2 of 23.  I will continue to monitor this.    Anemia.  His hemoglobin has been trending downward, 9.8 on 04/03/2015, down to 8.1 at discharge, and 7.5 today.  His white count is also coming down nicely, 9.1 today, 12 when he left the hospital, and 15.1 on 04/04/2015.   The cause of his declining hemoglobin is not exactly clear.  I will screen him for blood loss.     Hypokalemia.  This is stable.    Atrial fibrillation.  His heart rate is not controlled, in the low 100 range.  He is on Cardizem CD 180.  I will check his blood pressure and increase this to 240.     Hypothyroidism.  On replacement.  TSH was within the normal range on 03/28/2015 at 2.505.    Restless legs syndrome.  On Mirapex.    Hyperlipidemia.  On Pravachol.  Has a history of coronary artery disease.    On testosterone replacement.  The exact history here is unclear.    Penile edema: Does not appear to be a major issue. He states this is improving.    This patient was critically ill during his hospitalization however he appears to be stable currently. He is s/p ERCP but no abdominal pain. Cardizem will be increased

## 2015-04-12 ENCOUNTER — Inpatient Hospital Stay (HOSPITAL_COMMUNITY)
Admit: 2015-04-12 | Discharge: 2015-04-12 | Disposition: A | Discharge status: Home / Self Care | Payer: Medicare PPO | Attending: Internal Medicine | Admitting: Internal Medicine

## 2015-04-12 ENCOUNTER — Non-Acute Institutional Stay: Payer: Medicare PPO | Admitting: Internal Medicine

## 2015-04-12 ENCOUNTER — Encounter: Payer: Self-pay | Admitting: Internal Medicine

## 2015-04-12 DIAGNOSIS — R0989 Other specified symptoms and signs involving the circulatory and respiratory systems: Secondary | ICD-10-CM | POA: Diagnosis not present

## 2015-04-12 DIAGNOSIS — R609 Edema, unspecified: Secondary | ICD-10-CM | POA: Diagnosis not present

## 2015-04-12 DIAGNOSIS — I4892 Unspecified atrial flutter: Secondary | ICD-10-CM | POA: Diagnosis not present

## 2015-04-12 NOTE — Progress Notes (Signed)
Patient ID: [name], male   DOB: 1927/08/15, 81 y.o.   MRN: [id number]             DATE:  04/12/2015          FACILITY: [facility]                       LEVEL OF CARE:   SNF   CHIEF COMPLAINT:  Acute visit follow-up right elbow edema--tachycardia.       HISTORY OF PRESENT ILLNESS:  This is an 81 year-old man who lives independently in [place].  States he was well until shortly before his transfer to hospital with progressively worsening epigastric pain and fevers.  In the emergency room, he was found to have a temperature of 101 and a white count of 22.8.  He was hypotensive with a lactate of over 4.  LFTs were also elevated with a bili of 6.  He was found to have a contracted gallbladder.   He was felt to have ascending cholangitis with fever, jaundice.  He had an impacted common bile duct stone and, with a second attempt at ERCP, this was extracted.  He was noted to have E.coli bacteremia, treated with ciprofloxacin.    Other medical issues in the hospital included:    Atrial fibrillation with rapid ventricular response. He was somewhat tachycardic yesterday Dr. Dellia Nims increased his Cardizem from 180 mg up to 240 mg this appears to be helping pulse was in the 60s on exam this afternoon--    Acute hypercarbic respiratory failure requiring intubation.   Extubated on 03/30/2015.    Acute on chronic renal failure.  Felt by Nephrology to represent ATN in the setting of ACE inhibitors.  His discharge creatinine was 3.47.  It was 1.3 in May 2016.  His liver function tests are improving.    Anemia and thrombocytopenia.  Platelet count was improving.  He was discharged with a hemoglobin of 8.1, down to 7.5 yesterday.  His discharge white count was 12, down to 9.1 yesterday.  Platelets at 311,000.   His hemoglobin was normal in May 2016 at 14.3.    Other than the tachycardia his stay here is been relatively stable-he has complained of insomnia he attributes this to having a shared  room he is now in a private room and hopefully this will help    He has developed some edema of his right elbow he says this is not tender and is not warm is not erythematous this appears to be a fluid type pocket  Of note apparently has significant edema of the scrotal area in the hospital which has improved significantly.        PAST MEDICAL HISTORY/PROBLEM LIST:                      Severe sepsis with E.coli.    Choledocholethiasis with ascending cholangitis.  Currently improving.     Acute renal failure secondary to ATN.  Discharged with a creatinine of 3.47.    History of hiatal hernia.    Hypoxemic and hypercarbic respiratory failure.  Currently stable.    History of prostate cancer.     History of CABG x4.    Right knee osteoarthritis.  Status post total knee.    Left inguinal hernia.    History of atrial fibrillation/atrial flutter.    Hypothyroidism.      PAST SURGICAL HISTORY:  Hernia repair.    Prostate surgery.    Coronary artery bypass graft in 2015.    Total knee arthroplasty on the right in 2015.    Right thoracotomy as a child for pneumonia.    EGD in 2013 for a 11 cm sliding hiatal hernia.    ERCP x2.    CURRENT MEDICATIONS:  Medication list is reviewed.                         Ciprofloxacin 500 b.i.d. for a total of two weeks.    Diltiazem CD 240 q.d.      Eliquis 2.5 b.i.d.      Vitamin D3, 1000 a day.    Synthroid 25 daily.        Prilosec 40 q.d.      Ditropan 5 q.d.     Mirapex 1 tablet at bedtime, restless legs syndrome.    Pravachol 40 q.d.     Testosterone 300 monthly.    Discontinued medications include:    Norvasc.    Hydrochlorothiazide.    Lisinopril.    Metoprolol XL.    SOCIAL HISTORY:                   HOUSING:  The patient lives in [place].  Lives alone.      FUNCTIONAL STATUS:  Independent with ADLs and IADLs.  No ambulatory assist device.   TOBACCO USE:  The patient is a former smoker.    ALCOHOL:  No alcohol use.    FAMILY HISTORY:           MOTHER:  History of cancer.   Deceased.    FATHER:  Deceased.     REVIEW OF SYSTEMS:            GENERAL:  No weight loss before he became ill.--Appears his weights here so far been stable currently 171.4 as of yesterday   HEENT:  No visual complaints.   CHEST/RESPIRATORY:  Occasional shortness of breath and dry cough--but says this is slowly improving.   CARDIAC:  No current complaints of chest pain or palpitations.   GI:  He is not having abdominal pain, nausea or vomiting.  No dysphagia. d.   GU: He has some mild scrotal swelling but says this has improved significantly since his hospitalization   EDEMA/VARICOSITIES:  Extremities:  Has some edema of his legs but he says this is improved from when he presented in the hospital---also has the left elbow edema as noted above.   NEUROLOGICAL:  No complaints of focal weakness or numbness.   PSYCHIATRIC:   States he has no depression.  SKIN; complains of itching left side of his lower back   PHYSICAL EXAMINATION:   VITAL SIGNS:     Temperature 98.0 pulse 66 respirations 18 blood pressure 125/79 O2 saturation 95% on room air weight is 171.4   .  .   GENERAL APPEARANCE:  The patient is not in any distress.  He is alert, conversational, and able to give his own history.           HEENT:   MOUTH/THROAT:    He has angular chelosis on the right side.  Oral exam is otherwise normal, Tongue midline with full range of motion there is no tongue edema.   EYES:  Eyes look normal.   NECK/THYROID:  No palpable thyroid or lymphadenopathy.   LYMPHATICS:  None palpable in the cervical,area CHEST/RESPIRATORY:  Small amount of  wheezing coarse breath sounds on expiration posterior lung fields-there is no labored breathing   CARDIOVASCULAR:   CARDIAC:  Regular rate and rhythm with occasional irregular beats  There is a 3/6 systolic ejection murmur, maximal at the aortic area. .  His JVP is not  elevated--he has some mild lower extremity edema I would say trace to 1+ he says this has improved .     GASTROINTESTINAL:   ABDOMEN:  No masses.  no tenderness is noted  Muscle skeletal moves all extremities 4 I do note on his right elbow there is what appears to be a fluid pocket this is cool to touch nonerythematous nontender  And soft-- radial pulse intact    .   PHALLUS:    The patient is circumcised. He needs to have some edema of his scrotum and penis however he states this has improved             PSYCHIATRIC:   MENTAL STATUS:    Bright, alert.  Complaining about his neighbor not allowing him to sleep at night.  However he is now on a private room hopefully this will improve   Labs. 04/09/2015.  WBC 9.1 hemoglobin 7.5 platelets 314.  Sodium 140 potassium 4.1 BUN 53 creatinine 3.34.  Albumin 2.3-L2 phosphatase 186 and bilirubin 3.9 both of these have improved.  AST and ALT within normal limits     ASSESSMENT/PLAN:   chest congestion??-will check an x-ray he does not appear to be in any distress  --O2 stats are satisfactory this will have to be monitored                   E.coli sepsis.  Secondary to an impacted common bile duct stone.  He is completing Cipro    Ascending cholangitis.  His liver function tests are normalizing.  His alk phos is down to 186.  AST and ALT are normal at 3 and 4.9, respectively.  Total bilirubin down to 3.9.  This is gradually decreasing.  It was 6.3 on 04/06/2015.    Acute on chronic renal failure.  Secondary to ATN related to sepsis.  His discharge creatinine was 3.47.  It is 3.34 on most recent lab.  BUN is 53, potassium 4.1, CO2 of 23.   will continue to monitor this--update labs have been ordered for tomorrow.    Anemia.  His hemoglobin has been trending downward, 9.8 on 04/03/2015, down to 8.1 at discharge, and 7.5 on august 20--.  His white count is also coming down nicely, 9.1 t, 12 when he left the hospital, and 15.1 on 04/04/2015.    The cause of his declining hemoglobin is not exactly clear.  Stool guaics have been ordered as well as an updated CBC for tomorrow      Hypokalemia.  This is stable.    Atrial fibrillation.   This appears to be under better control since Cardizem was increased- continue to be monitor blood pressure appears to be stable at this time.     Hypothyroidism.  On replacement.  TSH was within the normal range on 03/28/2015 at 2.505.      Elbow edema-he does have at times in the it appears more his scrotal area which has improved-at this point will monitor the elbow  does not look to be painful infected or acutely concerning at this time will have to be watched --this appears to be more fluid  [id number]-of note greater than 30 minutes spent assessing patient-reviewing his chart-and coordinating  in reviewing numerous diagnoses-of note more than 50% of time spent coordinating numerous diagnoses with extensive chart review

## 2015-04-13 ENCOUNTER — Encounter (HOSPITAL_COMMUNITY)
Admission: RE | Admit: 2015-04-13 | Discharge: 2015-04-13 | Disposition: A | Discharge status: Home / Self Care | Payer: Medicare PPO | Source: Ambulatory Visit | Attending: Internal Medicine | Admitting: Internal Medicine

## 2015-04-13 ENCOUNTER — Encounter (HOSPITAL_COMMUNITY)
Admission: AD | Admit: 2015-04-13 | Discharge: 2015-04-13 | Disposition: A | Discharge status: Home / Self Care | Payer: Medicare PPO | Source: Skilled Nursing Facility | Attending: Internal Medicine | Admitting: Internal Medicine

## 2015-04-13 ENCOUNTER — Non-Acute Institutional Stay (SKILLED_NURSING_FACILITY): Payer: Medicare PPO | Admitting: Internal Medicine

## 2015-04-13 DIAGNOSIS — D500 Iron deficiency anemia secondary to blood loss (chronic): Secondary | ICD-10-CM

## 2015-04-13 DIAGNOSIS — N170 Acute kidney failure with tubular necrosis: Secondary | ICD-10-CM | POA: Diagnosis not present

## 2015-04-13 DIAGNOSIS — R609 Edema, unspecified: Secondary | ICD-10-CM

## 2015-04-13 DIAGNOSIS — Z01812 Encounter for preprocedural laboratory examination: Secondary | ICD-10-CM | POA: Insufficient documentation

## 2015-04-13 LAB — COMPREHENSIVE METABOLIC PANEL
ALK PHOS: 122 U/L (ref 38–126)
ALT: 36 U/L (ref 17–63)
ANION GAP: 7 (ref 5–15)
AST: 34 U/L (ref 15–41)
Albumin: 2.6 g/dL — ABNORMAL LOW (ref 3.5–5.0)
BUN: 45 mg/dL — ABNORMAL HIGH (ref 6–20)
CALCIUM: 8.3 mg/dL — AB (ref 8.9–10.3)
CO2: 25 mmol/L (ref 22–32)
CREATININE: 3.04 mg/dL — AB (ref 0.61–1.24)
Chloride: 107 mmol/L (ref 101–111)
GFR, EST AFRICAN AMERICAN: 20 mL/min — AB (ref >60)
GFR, EST NON AFRICAN AMERICAN: 17 mL/min — AB (ref >60)
Glucose, Bld: 64 mg/dL — ABNORMAL LOW (ref 65–99)
Potassium: 3.6 mmol/L (ref 3.5–5.1)
SODIUM: 139 mmol/L (ref 135–145)
TOTAL PROTEIN: 5.4 g/dL — AB (ref 6.5–8.1)
Total Bilirubin: 2.9 mg/dL — ABNORMAL HIGH (ref 0.3–1.2)

## 2015-04-13 LAB — CBC WITH DIFFERENTIAL/PLATELET

## 2015-04-13 LAB — HEMOGLOBIN AND HEMATOCRIT, BLOOD
HEMATOCRIT: 24.7 % — AB (ref 39.0–52.0)
HEMOGLOBIN: 8.3 g/dL — AB (ref 13.0–17.0)

## 2015-04-13 LAB — OCCULT BLOOD X 1 CARD TO LAB, STOOL: FECAL OCCULT BLD: NEGATIVE

## 2015-04-13 NOTE — Progress Notes (Signed)
Talked with Marlou Sa at [facility]. Dr Dellia Nims notified of hgb results. Dr Dellia Nims wanted to know why such an increase in 6 hours. Informed unable to explain. Suggested repeating Hgb in AM when pt returns for transfusion. Also suggested that he call the lab and question them. No new orders given.

## 2015-04-13 NOTE — Progress Notes (Signed)
Hgb at 1310 8.3. Talked with Marlou Sa at [facility]. She will consult Dr Dellia Nims to verify if transfusion needed and return call.

## 2015-04-14 ENCOUNTER — Inpatient Hospital Stay (HOSPITAL_COMMUNITY): Admit: 2015-04-14 | Payer: Medicare PPO

## 2015-04-14 ENCOUNTER — Encounter (HOSPITAL_COMMUNITY)
Admission: AD | Admit: 2015-04-14 | Discharge: 2015-04-14 | Disposition: A | Discharge status: Home / Self Care | Payer: Medicare PPO | Source: Skilled Nursing Facility | Attending: Internal Medicine | Admitting: Internal Medicine

## 2015-04-14 LAB — BASIC METABOLIC PANEL
ANION GAP: 9 (ref 5–15)
BUN: 39 mg/dL — AB (ref 6–20)
CO2: 24 mmol/L (ref 22–32)
Calcium: 8.6 mg/dL — ABNORMAL LOW (ref 8.9–10.3)
Chloride: 103 mmol/L (ref 101–111)
Creatinine, Ser: 2.92 mg/dL — ABNORMAL HIGH (ref 0.61–1.24)
GFR calc Af Amer: 21 mL/min — ABNORMAL LOW (ref >60)
GFR calc non Af Amer: 18 mL/min — ABNORMAL LOW (ref >60)
GLUCOSE: 97 mg/dL (ref 65–99)
POTASSIUM: 3.4 mmol/L — AB (ref 3.5–5.1)
Sodium: 136 mmol/L (ref 135–145)

## 2015-04-14 LAB — CBC
HEMATOCRIT: 22.9 % — AB (ref 39.0–52.0)
HEMOGLOBIN: 7.8 g/dL — AB (ref 13.0–17.0)
MCH: 33.3 pg (ref 26.0–34.0)
MCHC: 34.1 g/dL (ref 30.0–36.0)
MCV: 97.9 fL (ref 78.0–100.0)
Platelets: 403 10*3/uL — ABNORMAL HIGH (ref 150–400)
RBC: 2.34 MIL/uL — ABNORMAL LOW (ref 4.22–5.81)
RDW: 16.4 % — AB (ref 11.5–15.5)
WBC: 7.1 10*3/uL (ref 4.0–10.5)

## 2015-04-15 ENCOUNTER — Encounter: Payer: Self-pay | Admitting: Internal Medicine

## 2015-04-15 ENCOUNTER — Other Ambulatory Visit (HOSPITAL_COMMUNITY)
Admission: RE | Admit: 2015-04-15 | Discharge: 2015-04-15 | Disposition: A | Discharge status: Home / Self Care | Payer: Medicare PPO | Source: Other Acute Inpatient Hospital | Attending: Internal Medicine | Admitting: Internal Medicine

## 2015-04-15 ENCOUNTER — Non-Acute Institutional Stay (SKILLED_NURSING_FACILITY): Payer: Medicare PPO | Admitting: Internal Medicine

## 2015-04-15 DIAGNOSIS — D631 Anemia in chronic kidney disease: Secondary | ICD-10-CM | POA: Diagnosis not present

## 2015-04-15 DIAGNOSIS — N189 Chronic kidney disease, unspecified: Secondary | ICD-10-CM | POA: Diagnosis not present

## 2015-04-15 DIAGNOSIS — E876 Hypokalemia: Secondary | ICD-10-CM | POA: Diagnosis not present

## 2015-04-15 DIAGNOSIS — R790 Abnormal level of blood mineral: Secondary | ICD-10-CM | POA: Insufficient documentation

## 2015-04-15 DIAGNOSIS — D649 Anemia, unspecified: Secondary | ICD-10-CM | POA: Insufficient documentation

## 2015-04-15 LAB — OCCULT BLOOD X 1 CARD TO LAB, STOOL
Fecal Occult Bld: NEGATIVE
Fecal Occult Bld: NEGATIVE

## 2015-04-15 NOTE — Progress Notes (Signed)
Patient ID: [name], male   DOB: [birth year], 81 y.o.   MRN: [id number]              DATE:  04/15/2015          FACILITY: [facility]                       LEVEL OF CARE:   SNF   CHIEF COMPLAINT:  Acute visit follow-up multiple issues including chest congestion-anemia-hypokalemia       HISTORY OF PRESENT ILLNESS:  This is an 81 year-old man who lives independently in [place].  States he was well until shortly before his transfer to hospital with progressively worsening epigastric pain and fevers.  In the emergency room, he was found to have a temperature of 101 and a white count of 22.8.  He was hypotensive with a lactate of over 4.  LFTs were also elevated with a bili of 6.  He was found to have a contracted gallbladder.   He was felt to have ascending cholangitis with fever, jaundice.  He had an impacted common bile duct stone and, with a second attempt at ERCP, this was extracted.  He was noted to have E.coli bacteremia, treated with ciprofloxacin.    Other medical issues in the hospital included:    Atrial fibrillation with rapid ventricular response. He was somewhat tachycardicy Dr. Dellia Nims increased his Cardizem from 180 mg up to 240 mg this appears to be helping pulse was in the 80s on exam this afternoon--    Acute hypercarbic respiratory failure requiring intubation.   Extubated on 03/30/2015.    Acute on chronic renal failure.  Felt by Nephrology to represent ATN in the setting of ACE inhibitors.  His discharge creatinine was 3.47.  It was 1.3 in May 2016.  His liver function tests are improving--and creatinine as of yesterday had come down to 2.92.    Anemia and thrombocytopenia.  Platelet count was improving.  He was discharged with a hemoglobin of 8.1, down to 7.5 this week.  His discharge white count was 12, down to 7.1 yesterday.  Platelets at 403,000.   His hemoglobin was normal in May 2016 at 14.3.  I do note on most recent lab potassium was borderline low at  3.4 he is on 20 mEq of potassium a day.  I also noted some possible chest congestion when I saw him earlier this week however chest x-ray did not show anything overtly concerning it did show left side atelectasis with tiny bibasilar effusions-he actually says his breathing is getting a bit better compared to the hospital but still complains of some shortness of breath on exertion        PAST MEDICAL HISTORY/PROBLEM LIST:                      Severe sepsis with E.coli.    Choledocholethiasis with ascending cholangitis.  Currently improving.     Acute renal failure secondary to ATN.  Discharged with a creatinine of 3.47.    History of hiatal hernia.    Hypoxemic and hypercarbic respiratory failure.  Currently stable.    History of prostate cancer.     History of CABG x4.    Right knee osteoarthritis.  Status post total knee.    Left inguinal hernia.    History of atrial fibrillation/atrial flutter.    Hypothyroidism.      PAST SURGICAL HISTORY:  Hernia repair.    Prostate surgery.    Coronary artery bypass graft in 2015.    Total knee arthroplasty on the right in 2015.    Right thoracotomy as a child for pneumonia.    EGD in 2013 for a 11 cm sliding hiatal hernia.    ERCP x2.    CURRENT MEDICATIONS:  Medication list is reviewed.                         Ciprofloxacin 500 b.i.d. for a total of two weeks.    Diltiazem CD 240 q.d.      Eliquis 2.5 b.i.d.      Vitamin D3, 1000 a day.    Synthroid 25 daily.        Prilosec 40 q.d.      Ditropan 5 q.d.     Mirapex 1 tablet at bedtime, restless legs syndrome.    Pravachol 40 q.d.     Testosterone 300 monthly.    Discontinued medications include:    Norvasc.    Hydrochlorothiazide.    Lisinopril.    Metoprolol XL.    SOCIAL HISTORY:                   HOUSING:  The patient lives in [place].  Lives alone.      FUNCTIONAL STATUS:  Independent with ADLs and IADLs.  No ambulatory assist  device.   TOBACCO USE:  The patient is a former smoker.   ALCOHOL:  No alcohol use.    FAMILY HISTORY:           MOTHER:  History of cancer.   Deceased.    FATHER:  Deceased.     REVIEW OF SYSTEMS:            GENERAL:   Does not complaining any fever or chills says he still feels weak but apparently this is getting a bit better   HEENT:  No visual complaints.   CHEST/RESPIRATORY:  Occasional shortness of breath and dry cough--but says this is slowly improving.   CARDIAC:  No current complaints of chest pain or palpitations.   GI:  He is not having abdominal pain, nausea or vomiting.  No dysphagia. d.   GU: He has some mild scrotal swelling but says this has improved significantly since his hospitalization   EDEMA/VARICOSITIES:  Extremities:  Has some edema of his legs but thiss improved from when he presented in the hospital---also has the left elbow edema that appears relatively un changed from  Earlier this week   NEUROLOGICAL:  No complaints of focal weakness or numbness.   PSYCHIATRIC:   States he has no depression.  SKIN; does not complain of rashes or itching currently   PHYSICAL EXAMINATION:   VITAL SIGNS:      Temperature 98.1 pulse 88 respirations 20 blood pressure 151/79-136/52  .  Marland Kitchen   GENERAL APPEARANCE:  The patient is not in any distress.  He is alert, conversational, and able to give his own history.           HEENT:   MOUTH/THROAT:    He has angular chelosis on the right side.  Oral exam is otherwise normal, Tongue midline with full range of motion there is no tongue edema.   EYES:  Eyes look normal.  .    CHEST/RESPIRATORY:  Small amount of wheezing  on expiration posterior lung fields-there is no labored breathing   CARDIOVASCULAR:   CARDIAC:  Regular rate and rhythm with occasional irregular beats  There is a 3/6 systolic ejection murmur, maximal at the aortic area. .  His JVP is not elevated--he has some mild lower extremity edema I would say trace to 1+ this is  baseline with what I saw her earlier this week and apparently improved compared to the hospital .     GASTROINTESTINAL:  Sounds are positive ABDOMEN:  No masses.  no tenderness is noted  Muscle skeletal moves all extremities 4 I do note on his right elbow there is what appears to be a fluid pocket    .  Neuro--CN is grossly intact no lateralizing findings -- speech is clear             PSYCHIATRIC:   MENTAL STATUS:    Bright, alert.      Labs.  04/13/2014.  Sodium 136 potassium 3.4 BUN 39 creatinine 2.92.  WBC 7.1 hemoglobin 7.8 platelets 403.   04/09/2015.  WBC 9.1 hemoglobin 7.5 platelets 314.  Sodium 140 potassium 4.1 BUN 53 creatinine 3.34.  Albumin 2.3-L2 phosphatase 186 and bilirubin 3.9 both of these have improved.  AST and ALT within normal limits     ASSESSMENT/PLAN:   chest congestion??-  suspect he has somewhat small amount of chronic wheezing chest x-ray was not overtly concerning this point monitor he says his breathing has improved compared to the hospital                  E.coli sepsis.  Secondary to an impacted common bile duct stone.  He is completing Cipro    Ascending cholangitis.  His liver function tests are normalizing.  His alk phos is down to 186.  AST and ALT are normal at 3 and 4.9, respectively.  Total bilirubin down to 3.9.  This is gradually decreasing.  It was 6.3 on 04/06/2015.    Acute on chronic renal failure.  Secondary to ATN related to sepsis.  His discharge creatinine was 3.47.  It is 2.92 on most recent lab.   We will update this next week.    Anemia.    hemoglobin is 7.8 this appears to have stabilized.   The cause of his declining hemoglobin is not exactly clear.  Stool guaics have been ordered though  appear to be negative--date CBC first laboratory day next week    Hypokalemia. He has been on 20 mEq a day however potassium is still slightly low will increase this to 30 mEq a day and check a metabolic panel first  laboratory day next week and also in approximately 10 days to ensure stability    Atrial fibrillation.   This appears to be under better control since Cardizem was increased- continue to be monitor blood pressure appears to be stable at this time.     Hypothyroidism.  On replacement.  TSH was within the normal range on 03/28/2015 at 2.505.    [id number]

## 2015-04-18 ENCOUNTER — Non-Acute Institutional Stay (SKILLED_NURSING_FACILITY): Payer: Medicare PPO | Admitting: Internal Medicine

## 2015-04-18 ENCOUNTER — Encounter (HOSPITAL_COMMUNITY)
Admission: RE | Admit: 2015-04-18 | Discharge: 2015-04-18 | Disposition: A | Discharge status: Home / Self Care | Payer: Medicare PPO | Source: Skilled Nursing Facility | Attending: Internal Medicine | Admitting: Internal Medicine

## 2015-04-18 DIAGNOSIS — N170 Acute kidney failure with tubular necrosis: Secondary | ICD-10-CM | POA: Diagnosis not present

## 2015-04-18 DIAGNOSIS — N189 Chronic kidney disease, unspecified: Secondary | ICD-10-CM

## 2015-04-18 DIAGNOSIS — D631 Anemia in chronic kidney disease: Secondary | ICD-10-CM | POA: Diagnosis not present

## 2015-04-18 LAB — BASIC METABOLIC PANEL
ANION GAP: 10 (ref 5–15)
BUN: 28 mg/dL — ABNORMAL HIGH (ref 6–20)
CO2: 26 mmol/L (ref 22–32)
Calcium: 9.1 mg/dL (ref 8.9–10.3)
Chloride: 105 mmol/L (ref 101–111)
Creatinine, Ser: 2.8 mg/dL — ABNORMAL HIGH (ref 0.61–1.24)
GFR calc Af Amer: 22 mL/min — ABNORMAL LOW (ref >60)
GFR, EST NON AFRICAN AMERICAN: 19 mL/min — AB (ref >60)
GLUCOSE: 105 mg/dL — AB (ref 65–99)
POTASSIUM: 3.5 mmol/L (ref 3.5–5.1)
Sodium: 141 mmol/L (ref 135–145)

## 2015-04-18 LAB — CBC
HEMATOCRIT: 27.9 % — AB (ref 39.0–52.0)
HEMOGLOBIN: 9.3 g/dL — AB (ref 13.0–17.0)
MCH: 32.6 pg (ref 26.0–34.0)
MCHC: 33.3 g/dL (ref 30.0–36.0)
MCV: 97.9 fL (ref 78.0–100.0)
Platelets: 422 10*3/uL — ABNORMAL HIGH (ref 150–400)
RBC: 2.85 MIL/uL — AB (ref 4.22–5.81)
RDW: 16.4 % — ABNORMAL HIGH (ref 11.5–15.5)
WBC: 6.1 10*3/uL (ref 4.0–10.5)

## 2015-04-18 NOTE — Progress Notes (Signed)
Patient ID: [name], male   DOB: May 30, 1927, 81 y.o.   MRN: [id number] Facility; [facility] Chief complaint; review of medical issues including chronic renal failure History; this is a patient who came to [place] after a acute care admission from 8/8 through 8/19. The patient was septic with ascending cholangitis fever and jaundice. He was found to have an impacted common bile duct stone that was extracted at ERCP. He had Escherichia coli bacteremia. He also had the acute renal failure felt to be secondary to ATN. His discharge creatinine was down to 3.47. His liver function tests were improving. His creatinine continues to gradually improve.  Out of concerns for declining hemoglobin we have is done stools for occult blood which involvement negative. I had actually sent him over for a transfusion last week all other was an error in the hemoglobin measurement and this was not required.  CBC Latest Ref Rng 04/18/2015 04/14/2015 04/13/2015  WBC 4.0 - 10.5 K/uL 6.1 7.1 -  Hemoglobin 13.0 - 17.0 g/dL 9.3(L) 7.8(L) 8.3(L)  Hematocrit 39.0 - 52.0 % 27.9(L) 22.9(L) 24.7(L)  Platelets 150 - 400 K/uL 422(H) 403(H) -   Lab Results  Component Value Date   CREATININE 2.80* 04/18/2015   CREATININE 2.92* 04/14/2015   CREATININE 3.04* 04/13/2015    Past Medical History  Diagnosis Date  . Prostate cancer   . GERD (gastroesophageal reflux disease)     Large HH with intrathoracic component.   . Hypertension   . Hyperlipemia   . Arthritis   . Inguinal hernia     left  . Coronary artery disease   . Dysrhythmia   . Pneumonia   . H/O hiatal hernia   . CKD (chronic kidney disease) 08/2013    stage 3  . Choledocholithiasis    Past Surgical History  Procedure Laterality Date  . Hernia repair    . Prostate surgery    . Colonoscopy  2003, 01/2012    non-bleeding cecal AVMs, descending and sigmoid tics.   . Coronary artery bypass graft N/A 09/03/2013    Procedure: CORONARY ARTERY BYPASS GRAFTING times four  using left internal mammary and bilateral saphenous vein.;  Surgeon: Ivin Poot, MD;  Location: [facility];  Service: Open Heart Surgery;  Laterality: N/A;  . Intraoperative transesophageal echocardiogram N/A 09/03/2013    Procedure: INTRAOPERATIVE TRANSESOPHAGEAL ECHOCARDIOGRAM;  Surgeon: Ivin Poot, MD;  Location: [facility];  Service: Open Heart Surgery;  Laterality: N/A;  . Coronary angioplasty    . Cataract extraction Bilateral   . Total knee arthroplasty Right 03/11/2014    Procedure: TOTAL KNEE ARTHROPLASTY;  Surgeon: Hessie Dibble, MD;  Location: [facility];  Service: Orthopedics;  Laterality: Right;  . Left heart catheterization with coronary angiogram N/A 08/03/2013    Procedure: LEFT HEART CATHETERIZATION WITH CORONARY ANGIOGRAM;  Surgeon: Blane Ohara, MD;  Location: [facility] CATH LAB;  Service: Cardiovascular;  Laterality: N/A;  . Right thoracotomy  As a young child, ? 89 years old    for pneumonia   . Esophagogastroduodenoscopy  01/2012    11 cm sliding hiatal hernia.  . Ercp N/A 04/04/2015    Procedure: ENDOSCOPIC RETROGRADE CHOLANGIOPANCREATOGRAPHY (ERCP);  Surgeon: Inda Castle, MD;  Location: [facility];  Service: Endoscopy;  Laterality: N/A;  . Ercp N/A 04/06/2015    Procedure: ENDOSCOPIC RETROGRADE CHOLANGIOPANCREATOGRAPHY (ERCP);  Surgeon: Inda Castle, MD;  Location: [facility];  Service: Endoscopy;  Laterality: N/A;   Review of systems Respiratory; no cough no  sputum. He is still complaining of shortness of breath Cardiac no chest pain GI; no abdominal pain no nausea no vomiting GU no dysuria no incontinence. Extremities; lower extremity edema has been improving.  Physical examination Gen. the patient looks stable Vitals temperature is 90.8 pulse 88 respirations 20 blood pressure 128/73 Respiratory; clear entry bilaterally Cardiac heart sounds are normal 2 out of 6 systolic ejection murmur. JVP is not elevated there is no coccyx edema. Abdomen no liver no spleen  no tenderness no masses. Extremities he has some remaining pedal edema in his feet but no evidence of a DVT bilaterally  Impression/plan #1 acute cholangitis with Escherichia coli bacteremia. Everything seems to be stable here. He underwent ERCP 2 with stone extraction he has had no ongoing difficulties here #2 acute tubular necrosis this is gradually improving. #3 anemia all of this appears to be really stable his hemoglobin is increasing. There was no evidence of occult blood loss #4 chronic atrial fibrillation rate is controlled he is on Eliquis

## 2015-04-20 NOTE — Progress Notes (Addendum)
Patient ID: [name], male   DOB: [birth year], 81 y.o.   MRN: [id number]                PROGRESS NOTE  DATE:  04/13/2015        FACILITY: [facility]                  LEVEL OF CARE:   SNF   Acute Visit            CHIEF COMPLAINT:  Follow up severe anemia, edema, chronic renal failure.    HISTORY OF PRESENT ILLNESS:  This is a patient whom I admitted to the building a few days ago.    He was in hospital with ascending cholangitis and was critically ill.  Ultimately, he had an impacted common bile duct, extracted on the second attempt.  He was noted to have E.coli bacteremia, treated with ciprofloxacin.  From this point of view, he is doing well.  He is not complaining of abdominal pain, nausea or vomiting.  He is not running a temperature.  His white count has come down nicely.  Liver function tests are resolving.    Another issue in the hospital that was noted was progressive anemia and thrombocytopenia.  It was noted on his arrival here that his hemoglobin was 9.8 on 04/03/2015, down to 8.1 at discharge from the hospital, and 7.5 on 04/09/2015.  Today, it is down to 6.4.  His white count is down to 16.  Differential count is normal.    His sodium is 139, potassium 3.6, BUN 45, creatinine 3.04 which is gradually improving.  Alk phos is down to 122, AST 34, ALT 36.  Total bilirubin is down to 2.9 as compared with 6.3 on 04/06/2015.    Past Medical History  Diagnosis Date  . Prostate cancer   . GERD (gastroesophageal reflux disease)     Large HH with intrathoracic component.   . Hypertension   . Hyperlipemia   . Arthritis   . Inguinal hernia     left  . Coronary artery disease   . Dysrhythmia   . Pneumonia   . H/O hiatal hernia   . CKD (chronic kidney disease) 08/2013    stage 3  . Choledocholithiasis    Past Surgical History  Procedure Laterality Date  . Hernia repair    . Prostate surgery    . Colonoscopy  2003, 01/2012    non-bleeding cecal AVMs, descending  and sigmoid tics.   . Coronary artery bypass graft N/A 09/03/2013    Procedure: CORONARY ARTERY BYPASS GRAFTING times four using left internal mammary and bilateral saphenous vein.;  Surgeon: Ivin Poot, MD;  Location: [facility];  Service: Open Heart Surgery;  Laterality: N/A;  . Intraoperative transesophageal echocardiogram N/A 09/03/2013    Procedure: INTRAOPERATIVE TRANSESOPHAGEAL ECHOCARDIOGRAM;  Surgeon: Ivin Poot, MD;  Location: [facility];  Service: Open Heart Surgery;  Laterality: N/A;  . Coronary angioplasty    . Cataract extraction Bilateral   . Total knee arthroplasty Right 03/11/2014    Procedure: TOTAL KNEE ARTHROPLASTY;  Surgeon: Hessie Dibble, MD;  Location: [facility] [place];  Service: Orthopedics;  Laterality: Right;  . Left heart catheterization with coronary angiogram N/A 08/03/2013    Procedure: LEFT HEART CATHETERIZATION WITH CORONARY ANGIOGRAM;  Surgeon: Blane Ohara, MD;  Location[facility];  Service: Cardiovascular;  Laterality: N/A;  . Right thoracotomy  As a young child, ? 50 years old  for pneumonia   . Esophagogastroduodenoscopy  01/2012    11 cm sliding hiatal hernia.  . Ercp N/A 04/04/2015    Procedure: ENDOSCOPIC RETROGRADE CHOLANGIOPANCREATOGRAPHY (ERCP);  Surgeon: Inda Castle, MD;  Location: [facility];  Service: Endoscopy;  Laterality: N/A;  . Ercp N/A 04/06/2015    Procedure: ENDOSCOPIC RETROGRADE CHOLANGIOPANCREATOGRAPHY (ERCP);  Surgeon: Inda Castle, MD;  Location: [facility];  Service: Endoscopy;  Laterality: N/A;    CURRENT MEDICATIONS:  Medication list is reviewed.         Vitamin D3, 1000 U daily.    Cipro 500 once a day, to complete on 04/22/2015.    Ditropan XL 5 mg daily.    Eliquis 2.5 b.i.d.     Omeprazole 40 q.d.      Pravachol 40 q.d.     Synthroid 25 q.d.    REVIEW OF SYSTEMS:    GENERAL:  He is not having any fever or chills.   CHEST/RESPIRATORY:  He is complaining of shortness of breath.  A chest-x-ray done yesterday was  fairly benign.   CARDIAC:  He has known coronary artery disease, status post CABG.  However, he is not describing any exertional pain.    GI:  No nausea.  No vomiting.  No abdominal pain.  No hematemesis.  No blood noted in his stool.   GU:  No dysuria.  Urinalysis on 04/09/2015 showed trace blood in his urine.   VASCULAR:  Extremities:  He is not complaining of claudication.   MUSCULOSKELETAL:  No joint pain.      PHYSICAL EXAMINATION:   VITAL SIGNS:     TEMPERATURE:  98.1.    PULSE:  60.   RESPIRATIONS:  20.   BLOOD PRESSURE:  132/69.   WEIGHT:  171.4 pounds.  This is down from 173.8 pounds on 04/11/2015.   GENERAL APPEARANCE:  The patient is not in any distress.   HEENT:   MOUTH/THROAT:  No lesions seen.   CHEST/RESPIRATORY:  Clear air entry bilaterally.    CARDIOVASCULAR:   CARDIAC:  Heart sounds are normal.  He has no S3.  His JVP is elevated at 45 to the angle of the jaw.      GASTROINTESTINAL:   ABDOMEN:  No masses.  No right upper quadrant tenderness.   LIVER/SPLEEN/KIDNEYS:  No liver, no spleen.  No tenderness.     RECTAL:  Mid amount of OB-negative stool.   GENITOURINARY:   BLADDER:  Not distended.  There is no CVA tenderness.   PROSTATE:  Not enlarged.   CIRCULATION:   EDEMA/VARICOSITIES:  Extremities:  There is edema 2/3 of the way up his lower legs that is pitting and equal bilaterally.  No signs of a DVT.    ASSESSMENT/PLAN:                   Anemia, which has been progressive through his hospitalization and after his arrival here.  He is going to need to be transfused.  I do not really see the etiology of this.  I am going to have the staff monitor this carefully.  I note that he had fecal occult bloods in [facility], one of which was positive on 12/09/2013.  However, two subsequent tests in May 2015 and in May 2016 were both negative.    Acute renal failure secondary to ATN.  This is improving.    Shortness of breath.  In spite of the negative  x-ray, I suspect this is  probably mild  edema related to his renal failure.  I am going to put him on Lasix 40 mg a day.  I will follow up on his hemoglobin on Monday.    Elevated liver function tests.  These are all improving in the expected rate.

## 2015-04-25 ENCOUNTER — Encounter (HOSPITAL_COMMUNITY)
Admission: RE | Admit: 2015-04-25 | Discharge: 2015-04-25 | Disposition: A | Discharge status: Home / Self Care | Payer: Medicare PPO | Source: Skilled Nursing Facility | Attending: Internal Medicine | Admitting: Internal Medicine

## 2015-04-25 LAB — CBC WITH DIFFERENTIAL/PLATELET
BASOS ABS: 0 10*3/uL (ref 0.0–0.1)
BASOS PCT: 0 % (ref 0–1)
EOS ABS: 0.2 10*3/uL (ref 0.0–0.7)
Eosinophils Relative: 3 % (ref 0–5)
HEMATOCRIT: 31.2 % — AB (ref 39.0–52.0)
HEMOGLOBIN: 10.4 g/dL — AB (ref 13.0–17.0)
Lymphocytes Relative: 10 % — ABNORMAL LOW (ref 12–46)
Lymphs Abs: 0.9 10*3/uL (ref 0.7–4.0)
MCH: 32.2 pg (ref 26.0–34.0)
MCHC: 33.3 g/dL (ref 30.0–36.0)
MCV: 96.6 fL (ref 78.0–100.0)
MONO ABS: 1.1 10*3/uL — AB (ref 0.1–1.0)
Monocytes Relative: 12 % (ref 3–12)
NEUTROS ABS: 6.3 10*3/uL (ref 1.7–7.7)
NEUTROS PCT: 75 % (ref 43–77)
Platelets: 356 10*3/uL (ref 150–400)
RBC: 3.23 MIL/uL — ABNORMAL LOW (ref 4.22–5.81)
RDW: 14.9 % (ref 11.5–15.5)
WBC: 8.5 10*3/uL (ref 4.0–10.5)

## 2015-04-25 LAB — COMPREHENSIVE METABOLIC PANEL
ALT: 18 U/L (ref 17–63)
ANION GAP: 8 (ref 5–15)
AST: 21 U/L (ref 15–41)
Albumin: 3.6 g/dL (ref 3.5–5.0)
Alkaline Phosphatase: 119 U/L (ref 38–126)
BILIRUBIN TOTAL: 1.9 mg/dL — AB (ref 0.3–1.2)
BUN: 44 mg/dL — ABNORMAL HIGH (ref 6–20)
CALCIUM: 9.5 mg/dL (ref 8.9–10.3)
CO2: 25 mmol/L (ref 22–32)
Chloride: 103 mmol/L (ref 101–111)
Creatinine, Ser: 2.39 mg/dL — ABNORMAL HIGH (ref 0.61–1.24)
GFR calc non Af Amer: 23 mL/min — ABNORMAL LOW (ref >60)
GFR, EST AFRICAN AMERICAN: 27 mL/min — AB (ref >60)
Glucose, Bld: 125 mg/dL — ABNORMAL HIGH (ref 65–99)
Potassium: 4.5 mmol/L (ref 3.5–5.1)
Sodium: 136 mmol/L (ref 135–145)
TOTAL PROTEIN: 7.3 g/dL (ref 6.5–8.1)

## 2015-04-26 ENCOUNTER — Ambulatory Visit: Payer: Medicare PPO | Admitting: Physician Assistant

## 2015-04-27 ENCOUNTER — Non-Acute Institutional Stay: Payer: Medicare PPO | Admitting: Internal Medicine

## 2015-04-27 DIAGNOSIS — N189 Chronic kidney disease, unspecified: Secondary | ICD-10-CM | POA: Diagnosis not present

## 2015-04-27 DIAGNOSIS — I482 Chronic atrial fibrillation, unspecified: Secondary | ICD-10-CM

## 2015-04-27 DIAGNOSIS — K8050 Calculus of bile duct without cholangitis or cholecystitis without obstruction: Secondary | ICD-10-CM | POA: Diagnosis not present

## 2015-04-27 DIAGNOSIS — E876 Hypokalemia: Secondary | ICD-10-CM | POA: Diagnosis not present

## 2015-04-27 DIAGNOSIS — D631 Anemia in chronic kidney disease: Secondary | ICD-10-CM | POA: Diagnosis not present

## 2015-04-27 NOTE — Progress Notes (Signed)
Patient ID: [name], male   DOB: 1926/10/20, 81 y.o.   MRN: [id number]  Facility; [facility] This is a discharge note  Chief complaint; discharge note  HPI-; this is a patient who came to [place] after a acute care admission from 8/8 through 8/19. The patient was septic with ascending cholangitis fever and jaundice. He was found to have an impacted common bile duct stone that was extracted at ERCP. He had Escherichia coli bacteremia. He also had the acute renal failure felt to be secondary to ATN. His discharge creatinine was down to 3.47. His liver function tests were improving. His creatinine continues to gradually improve--on August 29 it was down to 2.8.--Update done on September 6 shows it is down to 2.39  .  Out of concerns for declining hemoglobin we have is done stools for occult blood which involvement negative. Was actually sent for a transfusion late last month however this was actually an error in the hemoglobin measurement and this was not required Hemoglobin is trending up actually was 10.4 on September 6.  Patient will be going home he continues to have some weakness and will need continued PT and OT for strengthening as well as home health support.  I do note he appears to have lost about 18 pounds during his admission here initially his appetite was quite poor but this is improving and appears over the last week or so his weight has stabilized  CBC Latest Ref Rng 04/18/2015 04/14/2015 04/13/2015  WBC 4.0 - 10.5 K/uL 6.1 7.1 -  Hemoglobin 13.0 - 17.0 g/dL 9.3(L) 7.8(L) 8.3(L)  Hematocrit 39.0 - 52.0 % 27.9(L) 22.9(L) 24.7(L)  Platelets 150 - 400 K/uL 422(H) 403(H) -   Lab Results  Component Value Date   CREATININE 2.80* 04/18/2015   CREATININE 2.92* 04/14/2015   CREATININE 3.04* 04/13/2015    Past Medical History  Diagnosis Date  . Prostate cancer   . GERD (gastroesophageal reflux disease)     Large HH with intrathoracic component.   . Hypertension   . Hyperlipemia   .  Arthritis   . Inguinal hernia     left  . Coronary artery disease   . Dysrhythmia   . Pneumonia   . H/O hiatal hernia   . CKD (chronic kidney disease) 08/2013    stage 3  . Choledocholithiasis    Past Surgical History  Procedure Laterality Date  . Hernia repair    . Prostate surgery    . Colonoscopy  2003, 01/2012    non-bleeding cecal AVMs, descending and sigmoid tics.   . Coronary artery bypass graft N/A 09/03/2013    Procedure: CORONARY ARTERY BYPASS GRAFTING times four using left internal mammary and bilateral saphenous vein.;  Surgeon: Ivin Poot, MD;  Location: [place];  Service: Open Heart Surgery;  Laterality: N/A;  . Intraoperative transesophageal echocardiogram N/A 09/03/2013    Procedure: INTRAOPERATIVE TRANSESOPHAGEAL ECHOCARDIOGRAM;  Surgeon: Ivin Poot, MD;  Location: [place];  Service: Open Heart Surgery;  Laterality: N/A;  . Coronary angioplasty    . Cataract extraction Bilateral   . Total knee arthroplasty Right 03/11/2014    Procedure: TOTAL KNEE ARTHROPLASTY;  Surgeon: Hessie Dibble, MD;  Location: [place];  Service: Orthopedics;  Laterality: Right;  . Left heart catheterization with coronary angiogram N/A 08/03/2013    Procedure: LEFT HEART CATHETERIZATION WITH CORONARY ANGIOGRAM;  Surgeon: Blane Ohara, MD;  Location: [facility] CATH LAB;  Service: Cardiovascular;  Laterality: N/A;  . Right thoracotomy  As a young child, ? 52 years old    for pneumonia   . Esophagogastroduodenoscopy  01/2012    11 cm sliding hiatal hernia.  . Ercp N/A 04/04/2015    Procedure: ENDOSCOPIC RETROGRADE CHOLANGIOPANCREATOGRAPHY (ERCP);  Surgeon: Inda Castle, MD;  Location: [place];  Service: Endoscopy;  Laterality: N/A;  . Ercp N/A 04/06/2015    Procedure: ENDOSCOPIC RETROGRADE CHOLANGIOPANCREATOGRAPHY (ERCP);  Surgeon: Inda Castle, MD;  Location: [facility];  Service: Endoscopy;  Laterality: N/A;   Review of systems In general no complaints of fever or chills says he  feels stronger.  Skin does not complain of rashes or itching.  Head ears eyes nose mouth and throat no plates visual changes or sore throat Respiratory; no cough no sputum. Does not complain of shortness of breath today Cardiac no chest painor edema GI; no abdominal pain no nausea no vomiting GU no dysuria no incontinence. Extremities; lower extremity edema has been improving. Muscle skeletal does not complain of joint pain and leg pain currently.  Neurologic is not complaining of dizziness headache or numbness.  Psych does not complain of depression or anxiety he is looking forward to going home appears to be in good spirits  Physical examination Gen. the patient looks stable  Temperature 98.6 pulse 79 respirations 19 blood pressure 119/66 Skin is warm and dry he still has what appears to be a small lipoma on his right elbow this appears to be decreasing in size.  Head ears eyes nose mouth and throat oropharynx clear mucous membranes moist visual acuity appears to be intact Oropharynx is clear mucous membranes moist  Respiratory; clear entry bilaterally Cardiac heart sounds are normal 2 out of 6 systolic ejection murmur. JVP is not elevated there is no coccyx edema. Or significant lower extremity edema Abdomen is soft with positive bowel sounds no tenderness no masses. Extremities --he does not really have any significant lower extremity edema Muscle skeletal is able to stand and ambulate with a walker still has some weakness but appears to be doing well and gaining strength.  Neurologic is grossly intact no lateralizing findings her speech is clear.  Psych he is alert oriented pleasant and appropriate  Labs.  Alugust  29 2016.  Sodium 141 potassium 3.5 BUN 28 creatinine 2.8.  CBC 6.1 hemoglobin 9.3 platelets 422  Impression/plan #1 acute cholangitis with Escherichia coli bacteremia. Everything seems to be stable here. He underwent ERCP 2 with stone extraction he has had  no ongoing difficulties here #2 acute tubular necrosis this is gradually improving. Recent creatinine on September 6 was 2.39 this continues to trend down #3 anemia all of this appears to be really stable his hemoglobin is increasing. There was no evidence of occult blood loss #4 chronic atrial fibrillation rate is controlled he is on Eliquis--he is on Cardizem for rate control.  #5 history hypothyroidism he is on Synthroid--TSH was within normal limits on August 8  #6-hypokalemia this is being supplemented most recent potassium was within normal limits at 4.5  #7-history of suspected diastolic CHF-appears echo done in August showed grade 2 diastolic dysfunctionshe is on Lasix with potassium supplementation he actually has lost weight here-if scales are believed about 18 pounds--this appears to be stable he does not have any lower extremity edema  Patient will be going home --apparently he has strong friends support- he will need home health support for his numerous medical issues as well as PT and OT for strengthening status post hospitalization.  [id number] note  greater than 30 minutes spent on this discharge summary-greater than 50% of time spent coordinating plan of care for numerous diagnoses       .

## 2015-04-29 DIAGNOSIS — N189 Chronic kidney disease, unspecified: Secondary | ICD-10-CM | POA: Diagnosis not present

## 2015-04-29 DIAGNOSIS — I4891 Unspecified atrial fibrillation: Secondary | ICD-10-CM | POA: Diagnosis not present

## 2015-04-29 DIAGNOSIS — I129 Hypertensive chronic kidney disease with stage 1 through stage 4 chronic kidney disease, or unspecified chronic kidney disease: Secondary | ICD-10-CM | POA: Diagnosis not present

## 2015-04-29 DIAGNOSIS — I2510 Atherosclerotic heart disease of native coronary artery without angina pectoris: Secondary | ICD-10-CM | POA: Diagnosis not present

## 2015-05-04 ENCOUNTER — Ambulatory Visit: Payer: Medicare PPO | Admitting: Family Medicine

## 2015-05-10 ENCOUNTER — Ambulatory Visit (INDEPENDENT_AMBULATORY_CARE_PROVIDER_SITE_OTHER): Payer: Medicare PPO | Admitting: Family Medicine

## 2015-05-10 ENCOUNTER — Encounter: Payer: Self-pay | Admitting: Family Medicine

## 2015-05-10 VITALS — BP 119/76 | HR 99 | Temp 96.7°F | Ht 71.0 in | Wt 150.0 lb

## 2015-05-10 DIAGNOSIS — A4151 Sepsis due to Escherichia coli [E. coli]: Secondary | ICD-10-CM

## 2015-05-10 DIAGNOSIS — Z09 Encounter for follow-up examination after completed treatment for conditions other than malignant neoplasm: Secondary | ICD-10-CM

## 2015-05-10 MED ORDER — FUROSEMIDE 40 MG PO TABS: 40.0000 mg | ORAL_TABLET | Freq: Every day | ORAL | Status: DC

## 2015-05-10 MED ORDER — PRAMIPEXOLE DIHYDROCHLORIDE 1 MG PO TABS: 1.0000 mg | ORAL_TABLET | Freq: Every day | ORAL | Status: DC

## 2015-05-10 MED ORDER — OXYBUTYNIN CHLORIDE 5 MG PO TABS: 5.0000 mg | ORAL_TABLET | Freq: Every day | ORAL | Status: DC

## 2015-05-10 MED ORDER — APIXABAN 2.5 MG PO TABS: 2.5000 mg | ORAL_TABLET | Freq: Two times a day (BID) | ORAL | Status: DC

## 2015-05-10 MED ORDER — OMEPRAZOLE 40 MG PO CPDR: 40.0000 mg | DELAYED_RELEASE_CAPSULE | Freq: Every day | ORAL | Status: DC

## 2015-05-10 MED ORDER — PRAVASTATIN SODIUM 40 MG PO TABS: 40.0000 mg | ORAL_TABLET | Freq: Every day | ORAL | Status: DC

## 2015-05-10 MED ORDER — LEVOTHYROXINE SODIUM 25 MCG PO TABS: 25.0000 ug | ORAL_TABLET | Freq: Every day | ORAL | Status: DC

## 2015-05-10 MED ORDER — DILTIAZEM HCL ER COATED BEADS 240 MG PO CP24: 240.0000 mg | ORAL_CAPSULE | Freq: Every day | ORAL | Status: DC

## 2015-05-10 NOTE — Patient Instructions (Signed)
The patient should continue with his rehabilitation therapy He should be careful not put yourself at risk for falling He should practice deep breathing throughout the day to strengthen his lungs He should keep his follow-up appointment with the cardiologist as planned We will also arrange for him to see Dr. Brantley Stage which was recommended when he was discharged from the hospital. When he returns to clinic in 4 weeks we will give him his flu shot. He should continue to drink plenty of fluids and take his medication as directed He should return in 4 weeks with some home blood pressure readings for review He is to be commended for his almost miraculous recovery from such a serious event

## 2015-05-10 NOTE — Progress Notes (Signed)
Subjective:    Patient ID: [name], male    DOB: May 07, 1927, 81 y.o.   MRN: [id number]  HPI Patient here today for hospital and rehab follow up from [facility]. The patient was admitted to the hospital in [facility] for sepsis secondary to ascending cholangitis from an Escherichia coli infection. He subsequently had an ERCP and a stone was removed. He was in the hospital at current until August 1920 was transferred to the [facility] in regional for further recuperation and rehabilitation. He comes to the office today after being at home almost 2 weeks from the [facility]. He is doing extremely well considering all he is been through. Over the past 2 years he has had bypass surgery. He did see the cardiologist while he was in the hospital. He has plans to see him again October 1 sometime. He also is getting home rehabilitation therapy and he is still very fatigued from this experience. Today he denies chest pain trouble swallowing heartburn indigestion nausea or vomiting or diarrhea. He has not noticed the color in his stools. He did have fecal occult blood test done in the [facility] and these were all negative. His last blood work showed an improving hemoglobin. As far as voiding is concerned he goes frequently at nighttime. The hospital discharge summary was reviewed as well [facility] As [facility] result of this we will be getting lab work today as a baseline for future follow-up      Patient Active Problem List   Diagnosis Date Noted  . Anemia 04/15/2015  . Anemia, chronic renal failure 04/15/2015  . Hypokalemia 04/15/2015  . Edema 04/12/2015  . Hyperbilirubinemia   . Choledocholithiasis 04/06/2015  . Encounter for nasogastric (NG) tube placement   . Hypoxia   . Abdominal pain, epigastric   . Abnormal liver enzymes   . Abnormal LFTs   . Biliary sepsis   . Acute respiratory failure   . Severe sepsis   . Bacteremia due to Gram-negative bacteria   .  Hiatal hernia   . Sepsis 03/28/2015  . Incisional hernia, without obstruction or gangrene 08/17/2014  . Atrial flutter 07/28/2014  . Palpitation 06/16/2014  . Pain in the chest 06/10/2014  . Right knee DJD 03/11/2014  . S/P CABG x 4 09/03/2013  . Insomnia 07/27/2013  . Testosterone deficiency 07/27/2013  . Osteoarthritis of right knee 07/27/2013  . Hyperlipidemia 06/10/2013  . Heart murmur 06/10/2013  . Dyspnea 06/10/2013  . Inguinal hernia, left. 04/04/2012  . Paraesophageal hiatal hernia 03/24/2012  . Prostate cancer 01/15/2012  . Special screening for malignant neoplasms, colon 01/15/2012   Outpatient Encounter Prescriptions as of 05/10/2015  Medication Sig  . apixaban (ELIQUIS) 2.5 MG TABS tablet Take 1 tablet (2.5 mg total) by mouth 2 (two) times daily.  . Cholecalciferol (VITAMIN D3) 1000 UNITS CAPS Take 1,000 Units by mouth daily.   Marland Kitchen diltiazem (CARDIZEM CD) 180 MG 24 hr capsule Take 1 capsule (180 mg total) by mouth daily. (Patient taking differently: Take 240 mg by mouth daily. )  . levothyroxine (SYNTHROID, LEVOTHROID) 25 MCG tablet Take 1 tablet (25 mcg total) by mouth daily before breakfast.  . omeprazole (PRILOSEC) 40 MG capsule Take 1 capsule (40 mg total) by mouth daily.  Marland Kitchen oxybutynin (DITROPAN) 5 MG tablet Take 1 tablet (5 mg total) by mouth daily.  . potassium chloride (KLOR-CON) 20 MEQ packet Take 30 mEq by mouth daily. Take 30 meq of potassium  daily  . pramipexole (MIRAPEX) 1 MG tablet Take 1 tablet (1 mg total) by mouth at bedtime.  . pravastatin (PRAVACHOL) 40 MG tablet Take 1 tablet (40 mg total) by mouth daily.  Marland Kitchen testosterone cypionate (DEPOTESTOTERONE CYPIONATE) 200 MG/ML injection Inject 300 mg into the muscle every 28 (twenty-eight) days.  . [DISCONTINUED] ciprofloxacin (CIPRO) 500 MG tablet Take 1 tablet (500 mg total) by mouth daily.   No facility-administered encounter medications on file as of 05/10/2015.      Review of Systems  HENT: Negative.     Eyes: Negative.   Respiratory: Positive for shortness of breath.   Cardiovascular: Negative.   Gastrointestinal: Negative.   Endocrine: Negative.   Genitourinary: Negative.   Musculoskeletal: Negative.   Skin: Negative.   Allergic/Immunologic: Negative.   Neurological: Positive for weakness.  Hematological: Negative.   Psychiatric/Behavioral: Negative.        Objective:   Physical Exam  Constitutional: He is oriented to person, place, and time. He appears well-developed and well-nourished. No distress.  The patient is pleasant and alert and tired. He looks much the same as he did prior to this major hospitalization but with less weight.  HENT:  Head: Normocephalic and atraumatic.  Right Ear: External ear normal.  Left Ear: External ear normal.  Nose: Nose normal.  Mouth/Throat: Oropharynx is clear and moist. No oropharyngeal exudate.  Eyes: Conjunctivae and EOM are normal. Pupils are equal, round, and reactive to light. Right eye exhibits no discharge. Left eye exhibits no discharge. No scleral icterus.  Neck: Normal range of motion. Neck supple. No thyromegaly present.  Cardiovascular: Normal rate and regular rhythm.   No murmur heard. The heart rhythm was regular today at 84/m  Pulmonary/Chest: Effort normal and breath sounds normal. No respiratory distress. He has no wheezes. He has no rales. He exhibits no tenderness.  The chest is clear anteriorly and posteriorly  Abdominal: Soft. Bowel sounds are normal. He exhibits no mass. There is no tenderness. There is no rebound and no guarding.  The abdomen was soft without organ enlargement masses or tenderness and there was no inguinal adenopathy.  Musculoskeletal: Normal range of motion. He exhibits no edema or tenderness.  Lymphadenopathy:    He has no cervical adenopathy.  Neurological: He is alert and oriented to person, place, and time. He has normal reflexes. No cranial nerve deficit.  Skin: Skin is warm and dry. No rash  noted.  Psychiatric: He has a normal mood and affect. His behavior is normal. Judgment and thought content normal.  Nursing note and vitals reviewed.   BP 119/76 mmHg  Pulse 99  Temp(Src) 96.7 F (35.9 C) (Oral)  Ht 5\' 11"  (1.803 m)  Wt 150 lb (68.04 kg)  BMI 20.93 kg/m2  SpO2 99%       Assessment & Plan:  1. Hospital discharge follow-up -Continue with rehabilitation and continue with follow-up with cardiologist and surgical specialist - CBC with Differential/Platelet - Brain natriuretic peptide - Hepatic function panel - Lipid panel - Thyroid Panel With TSH  Patient Instructions  The patient should continue with his rehabilitation therapy He should be careful not put yourself at risk for falling He should practice deep breathing throughout the day to strengthen his lungs He should keep his follow-up appointment with the cardiologist as planned We will also arrange for him to see Dr. Brantley Stage which was recommended when he was discharged from the hospital. When he returns to clinic in 4 weeks we will give him his  flu shot. He should continue to drink plenty of fluids and take his medication as directed He should return in 4 weeks with some home blood pressure readings for review He is to be commended for his almost miraculous recovery from such a serious event   Arrie Senate MD

## 2015-05-10 NOTE — Progress Notes (Deleted)
   Subjective:    Patient ID: [name], male    DOB: 15-Jun-1927, 81 y.o.   MRN: [id number]  HPI    Review of Systems     Objective:   Physical Exam        Assessment & Plan:  And then hadhe did wake up and then was kicked liver tests got worse t now had to be intubated and the daughter wanted him transferred to [place] yearly Izora Gala really did not want resuscitativehan he's

## 2015-05-10 NOTE — Addendum Note (Signed)
Addended by: Zannie Cove on: 05/10/2015 02:47 PM   Modules accepted: Orders

## 2015-05-11 LAB — CBC WITH DIFFERENTIAL/PLATELET
Basophils Absolute: 0 10*3/uL (ref 0.0–0.2)
Basos: 0 %
EOS (ABSOLUTE): 0.2 10*3/uL (ref 0.0–0.4)
EOS: 2 %
HEMATOCRIT: 32.4 % — AB (ref 37.5–51.0)
Hemoglobin: 10.7 g/dL — ABNORMAL LOW (ref 12.6–17.7)
Immature Grans (Abs): 0 10*3/uL (ref 0.0–0.1)
Immature Granulocytes: 0 %
LYMPHS ABS: 1 10*3/uL (ref 0.7–3.1)
Lymphs: 10 %
MCH: 31.8 pg (ref 26.6–33.0)
MCHC: 33 g/dL (ref 31.5–35.7)
MCV: 96 fL (ref 79–97)
MONOS ABS: 0.6 10*3/uL (ref 0.1–0.9)
Monocytes: 6 %
Neutrophils Absolute: 7.9 10*3/uL — ABNORMAL HIGH (ref 1.4–7.0)
Neutrophils: 82 %
Platelets: 313 10*3/uL (ref 150–379)
RBC: 3.37 x10E6/uL — AB (ref 4.14–5.80)
RDW: 13.9 % (ref 12.3–15.4)
WBC: 9.7 10*3/uL (ref 3.4–10.8)

## 2015-05-11 LAB — BRAIN NATRIURETIC PEPTIDE: BNP: 41.7 pg/mL (ref 0.0–100.0)

## 2015-05-11 LAB — LIPID PANEL
CHOLESTEROL TOTAL: 126 mg/dL (ref 100–199)
Chol/HDL Ratio: 2.8 ratio units (ref 0.0–5.0)
HDL: 45 mg/dL (ref >39)
LDL Calculated: 49 mg/dL (ref 0–99)
TRIGLYCERIDES: 161 mg/dL — AB (ref 0–149)
VLDL Cholesterol Cal: 32 mg/dL (ref 5–40)

## 2015-05-11 LAB — THYROID PANEL WITH TSH
FREE THYROXINE INDEX: 2.2 (ref 1.2–4.9)
T3 UPTAKE RATIO: 33 % (ref 24–39)
T4, Total: 6.8 ug/dL (ref 4.5–12.0)
TSH: 5.21 u[IU]/mL — ABNORMAL HIGH (ref 0.450–4.500)

## 2015-05-11 LAB — HEPATIC FUNCTION PANEL
ALK PHOS: 114 IU/L (ref 39–117)
ALT: 15 IU/L (ref 0–44)
AST: 17 IU/L (ref 0–40)
Albumin: 4.3 g/dL (ref 3.5–4.7)
Bilirubin Total: 0.9 mg/dL (ref 0.0–1.2)
Bilirubin, Direct: 0.64 mg/dL — ABNORMAL HIGH (ref 0.00–0.40)
TOTAL PROTEIN: 7.1 g/dL (ref 6.0–8.5)

## 2015-05-12 ENCOUNTER — Encounter: Payer: Self-pay | Admitting: *Deleted

## 2015-05-23 ENCOUNTER — Ambulatory Visit (INDEPENDENT_AMBULATORY_CARE_PROVIDER_SITE_OTHER): Payer: Medicare PPO | Admitting: Family Medicine

## 2015-05-23 ENCOUNTER — Encounter: Payer: Self-pay | Admitting: Family Medicine

## 2015-05-23 VITALS — BP 136/79 | HR 87 | Temp 97.0°F | Ht 71.0 in | Wt 153.0 lb

## 2015-05-23 DIAGNOSIS — R946 Abnormal results of thyroid function studies: Secondary | ICD-10-CM | POA: Diagnosis not present

## 2015-05-23 DIAGNOSIS — E559 Vitamin D deficiency, unspecified: Secondary | ICD-10-CM

## 2015-05-23 DIAGNOSIS — Z23 Encounter for immunization: Secondary | ICD-10-CM | POA: Diagnosis not present

## 2015-05-23 DIAGNOSIS — I4891 Unspecified atrial fibrillation: Secondary | ICD-10-CM | POA: Diagnosis not present

## 2015-05-23 DIAGNOSIS — C61 Malignant neoplasm of prostate: Secondary | ICD-10-CM

## 2015-05-23 DIAGNOSIS — E349 Endocrine disorder, unspecified: Secondary | ICD-10-CM

## 2015-05-23 DIAGNOSIS — R7989 Other specified abnormal findings of blood chemistry: Secondary | ICD-10-CM

## 2015-05-23 DIAGNOSIS — E291 Testicular hypofunction: Secondary | ICD-10-CM

## 2015-05-23 DIAGNOSIS — L820 Inflamed seborrheic keratosis: Secondary | ICD-10-CM | POA: Diagnosis not present

## 2015-05-23 DIAGNOSIS — E785 Hyperlipidemia, unspecified: Secondary | ICD-10-CM | POA: Diagnosis not present

## 2015-05-23 DIAGNOSIS — I10 Essential (primary) hypertension: Secondary | ICD-10-CM

## 2015-05-23 NOTE — Progress Notes (Signed)
Subjective:    Patient ID: [name], male    DOB: 08-23-1926, 81 y.o.   MRN: [id number]  HPI Pt here for follow up and management of chronic medical problems which includes hypertension and hyperlipidemia. He is taking medications regularly. Doing well and is feeling stronger. He has had recent lab work and this will be reviewed with him today. He is due for a BMP and to recheck the potassium and this will be done today. On the recent lab work his TSH was slightly elevated and we will ask him to come back and recheck the thyroid profile along with a CBC in about 4 weeks. The CBC is to follow-up on his iron deficiency anemia. The CBC has a normal white blood cell count. The hemoglobin continues to improve which is good is still low but it is better than when it was checked 2 weeks ago. We will continue to monitor this. The platelet count is adequate. The BNP or a sensitive test for heart failure was within normal limits. 1 liver function test is slightly elevated and we will continue to monitor this. The total cholesterol is good at 126 the LDL C cholesterol was good at 49. The triglycerides were elevated but the patient was not fasting when this was done.----- the patient should continue with his pravastatin and good diet habits. The TSH is slightly elevated. This could mean that his thyroid is becoming underactive. It is only slightly elevated. We should recheck a thyroid profile when he returns to the clinic at his next visit which I think is about 4 weeks out if it remains elevated are more elevated at that time we will increase his levothyroxine dose. The patient brings in blood pressures for review and I will be scanned into the record. These are good. He denies chest pain or shortness of breath other than just recuperating from this hospital stay that he has had recently. He is swallowing okay and his stools are normal in color. He does have some constipation and admits to not drinking enough  fluids. He is passing his water without problems. He is practicing deep breathing exercises. He has finished his home physical therapy feels like he is not in need of any more therapy.    Patient Active Problem List   Diagnosis Date Noted  . Anemia 04/15/2015  . Anemia, chronic renal failure 04/15/2015  . Hypokalemia 04/15/2015  . Edema 04/12/2015  . Hyperbilirubinemia   . Choledocholithiasis 04/06/2015  . Encounter for nasogastric (NG) tube placement   . Hypoxia   . Abdominal pain, epigastric   . Abnormal liver enzymes   . Abnormal LFTs   . Biliary sepsis   . Acute respiratory failure ([place])   . Severe sepsis ([place])   . Bacteremia due to Gram-negative bacteria ([place])   . Hiatal hernia   . Sepsis ([place]) 03/28/2015  . Incisional hernia, without obstruction or gangrene 08/17/2014  . Atrial flutter ([place]) 07/28/2014  . Palpitation 06/16/2014  . Pain in the chest 06/10/2014  . Right knee DJD 03/11/2014  . S/P CABG x 4 09/03/2013  . Insomnia 07/27/2013  . Testosterone deficiency 07/27/2013  . Osteoarthritis of right knee 07/27/2013  . Hyperlipidemia 06/10/2013  . Heart murmur 06/10/2013  . Dyspnea 06/10/2013  . Inguinal hernia, left. 04/04/2012  . Paraesophageal hiatal hernia 03/24/2012  . Prostate cancer ([place]) 01/15/2012  . Special screening for malignant neoplasms, colon 01/15/2012   Outpatient Encounter Prescriptions as of 05/23/2015  Medication Sig  .  apixaban (ELIQUIS) 2.5 MG TABS tablet Take 1 tablet (2.5 mg total) by mouth 2 (two) times daily.  . Cholecalciferol (VITAMIN D3) 1000 UNITS CAPS Take 1,000 Units by mouth daily.   Marland Kitchen diltiazem (DILTIAZEM CD) 240 MG 24 hr capsule Take 1 capsule (240 mg total) by mouth daily.  . furosemide (LASIX) 40 MG tablet Take 1 tablet (40 mg total) by mouth daily.  Marland Kitchen levothyroxine (SYNTHROID, LEVOTHROID) 25 MCG tablet Take 1 tablet (25 mcg total) by mouth daily before breakfast.  . omeprazole (PRILOSEC) 40 MG capsule Take 1 capsule (40 mg  total) by mouth daily.  Marland Kitchen oxybutynin (DITROPAN) 5 MG tablet Take 1 tablet (5 mg total) by mouth daily.  . potassium chloride (KLOR-CON) 20 MEQ packet Take 30 mEq by mouth daily. Take 30 meq of potassium daily  . pramipexole (MIRAPEX) 1 MG tablet Take 1 tablet (1 mg total) by mouth at bedtime.  . pravastatin (PRAVACHOL) 40 MG tablet Take 1 tablet (40 mg total) by mouth daily.  . temazepam (RESTORIL) 15 MG capsule Take 15 mg by mouth at bedtime as needed for sleep.  . [DISCONTINUED] testosterone cypionate (DEPOTESTOTERONE CYPIONATE) 200 MG/ML injection Inject 300 mg into the muscle every 28 (twenty-eight) days.   No facility-administered encounter medications on file as of 05/23/2015.      Review of Systems  Constitutional: Negative.   HENT: Negative.   Eyes: Negative.   Respiratory: Negative.   Cardiovascular: Negative.   Gastrointestinal: Negative.   Endocrine: Negative.   Genitourinary: Negative.   Musculoskeletal: Negative.   Skin: Negative.   Allergic/Immunologic: Negative.   Neurological: Negative.   Hematological: Negative.   Psychiatric/Behavioral: Negative.        Objective:   Physical Exam  Constitutional: He is oriented to person, place, and time. He appears well-developed and well-nourished. No distress.  HENT:  Head: Normocephalic and atraumatic.  Right Ear: External ear normal.  Left Ear: External ear normal.  Nose: Nose normal.  Mouth/Throat: Oropharynx is clear and moist. No oropharyngeal exudate.  Eyes: Conjunctivae and EOM are normal. Pupils are equal, round, and reactive to light. Right eye exhibits no discharge. Left eye exhibits no discharge. No scleral icterus.  Neck: Normal range of motion. Neck supple. No thyromegaly present.  Cardiovascular: Normal rate and regular rhythm.   Murmur heard. The heart has a regular rate and rhythm at 84/m with a grade 1/6 systolic ejection murmur  Pulmonary/Chest: Effort normal and breath sounds normal. No respiratory  distress. He has no wheezes. He has no rales. He exhibits no tenderness.  Good breath sounds bilaterally anteriorly and posteriorly  Abdominal: Soft. Bowel sounds are normal. He exhibits no mass. There is no tenderness. There is no rebound and no guarding.  Slight epigastric tenderness  Musculoskeletal: Normal range of motion. He exhibits no edema or tenderness.  Lymphadenopathy:    He has no cervical adenopathy.  Neurological: He is alert and oriented to person, place, and time.  Skin: Skin is warm and dry. No rash noted.  Cryotherapy was done on an irritated seborrheic keratosis on his anterior chest [place] without complication  Psychiatric: He has a normal mood and affect. His behavior is normal. Judgment and thought content normal.  Nursing note and vitals reviewed.  BP 136/79 mmHg  Pulse 87  Temp(Src) 97 F (36.1 C) (Oral)  Ht _0  (1.803 m)  Wt 153 lb (69.4 kg)  BMI 21.35 kg/m2        Assessment & Plan:  1. Prostate cancer (  [place]) -Continue to follow-up with urology  2. Essential hypertension -Continue current treatment. Blood pressure readings from home were good as well as in the office today. - BMP8+EGFR  3. Testosterone deficiency -The patient is feeling better and he is currently on no treatment for this and at this time no treatment will be started.  4. Hyperlipidemia -Recent cholesterol numbers were reviewed with the patient today and he will continue with current treatment and gradually increase his exercise level. - BMP8+EGFR  5. Vitamin D deficiency -He will continue with current vitamin D replacement  6. Atrial fibrillation, unspecified -He will keep his cardiology appointment this week as planned and today his heart had a normal sinus rhythm at 84/m - BMP8+EGFR - CBC with Differential/Platelet; Future  7. TSH elevation -The TSH was slightly elevated and this treatment will not be increased and we will repeat a thyroid profile in about 4 weeks and at  that time adjust his medicine as needed - Thyroid Panel With TSH; Future - CBC with Differential/Platelet; Future  8. Keratosis, inflamed seborrheic -Cryotherapy was done on this irritated lesion on his anterior chest [place] without problems  A BMP will be done today to check his potassium and a future order we'll will be put in for a thyroid profile as well as a hemoglobin in about 4 weeks.  Patient Instructions                       Medicare Annual Wellness Visit  [facility] and the medical providers at [facility] strive to bring you the best medical care.  In doing so we not only want to address your current medical conditions and concerns but also to detect new conditions early and prevent illness, disease and health-related problems.    Medicare offers a yearly Wellness Visit which allows our clinical staff to assess your need for preventative services including immunizations, lifestyle education, counseling to decrease risk of preventable diseases and screening for fall risk and other medical concerns.    This visit is provided free of charge (no copay) for all Medicare recipients. The clinical pharmacists at [facility] have begun to conduct these Wellness Visits which will also include a thorough review of all your medications.    As you primary medical provider recommend that you make an appointment for your Annual Wellness Visit if you have not done so already this year.  You may set up this appointment before you leave today or you may call back ([phone]) and schedule an appointment.  Please make sure when you call that you mention that you are scheduling your Annual Wellness Visit with the clinical pharmacist so that the appointment may be made for the proper length of time.     Continue current medications. Continue good therapeutic lifestyle changes which include good diet and exercise. Fall precautions discussed with patient. If an  FOBT was given today- please return it to our front desk. If you are over 30 years old - you may need Prevnar 29 or the adult Pneumonia vaccine.  **Flu shots will be available soon--- please call and schedule a FLU-CLINIC appointment**  After your visit with [place] today you will receive a survey in the mail or online from [employer] regarding your care with [place]. Please take a moment to fill this out. Your feedback is very important to [place] as you can help [place] better understand your patient needs as well as improve your experience  and satisfaction. WE CARE ABOUT YOU!!!   **Please join [place] SEPT.[email or web address], 2016 from 5:00 to 7:00pm for our OPEN HOUSE! Come out and meet our NEW providers** Please continue home physical therapy Please continue deep breathing exercises Please drink plenty of fluids to help the constipation   Arrie Senate MD

## 2015-05-23 NOTE — Patient Instructions (Addendum)
Medicare Annual Wellness Visit  [facility] and the medical providers at [facility] strive to bring you the best medical care.  In doing so we not only want to address your current medical conditions and concerns but also to detect new conditions early and prevent illness, disease and health-related problems.    Medicare offers a yearly Wellness Visit which allows our clinical staff to assess your need for preventative services including immunizations, lifestyle education, counseling to decrease risk of preventable diseases and screening for fall risk and other medical concerns.    This visit is provided free of charge (no copay) for all Medicare recipients. The clinical pharmacists at [facility] have begun to conduct these Wellness Visits which will also include a thorough review of all your medications.    As you primary medical provider recommend that you make an appointment for your Annual Wellness Visit if you have not done so already this year.  You may set up this appointment before you leave today or you may call back ([phone]) and schedule an appointment.  Please make sure when you call that you mention that you are scheduling your Annual Wellness Visit with the clinical pharmacist so that the appointment may be made for the proper length of time.     Continue current medications. Continue good therapeutic lifestyle changes which include good diet and exercise. Fall precautions discussed with patient. If an FOBT was given today- please return it to our front desk. If you are over 61 years old - you may need Prevnar 38 or the adult Pneumonia vaccine.  **Flu shots will be available soon--- please call and schedule a FLU-CLINIC appointment**  After your visit with [place] today you will receive a survey in the mail or online from [employer] regarding your care with [place]. Please take a moment to fill this out. Your feedback is  very important to [place] as you can help [place] better understand your patient needs as well as improve your experience and satisfaction. WE CARE ABOUT YOU!!!   **Please join [place] SEPT.[email or web address], 2016 from 5:00 to 7:00pm for our OPEN HOUSE! Come out and meet our NEW providers** Please continue home physical therapy Please continue deep breathing exercises Please drink plenty of fluids to help the constipation

## 2015-05-24 LAB — BMP8+EGFR
BUN / CREAT RATIO: 11 (ref 10–22)
BUN: 19 mg/dL (ref 8–27)
CALCIUM: 10.6 mg/dL — AB (ref 8.6–10.2)
CHLORIDE: 95 mmol/L — AB (ref 97–108)
CO2: 24 mmol/L (ref 18–29)
CREATININE: 1.72 mg/dL — AB (ref 0.76–1.27)
GFR calc non Af Amer: 35 mL/min/{1.73_m2} — ABNORMAL LOW (ref >59)
GFR, EST AFRICAN AMERICAN: 41 mL/min/{1.73_m2} — AB (ref >59)
Glucose: 128 mg/dL — ABNORMAL HIGH (ref 65–99)
Potassium: 4.5 mmol/L (ref 3.5–5.2)
Sodium: 138 mmol/L (ref 134–144)

## 2015-05-25 ENCOUNTER — Encounter: Payer: Self-pay | Admitting: Cardiology

## 2015-05-25 ENCOUNTER — Other Ambulatory Visit: Payer: Self-pay | Admitting: Internal Medicine

## 2015-05-25 ENCOUNTER — Ambulatory Visit (INDEPENDENT_AMBULATORY_CARE_PROVIDER_SITE_OTHER): Payer: Medicare PPO | Admitting: Cardiology

## 2015-05-25 VITALS — BP 110/70 | HR 84 | Ht 71.0 in | Wt 154.0 lb

## 2015-05-25 DIAGNOSIS — I4892 Unspecified atrial flutter: Secondary | ICD-10-CM

## 2015-05-25 NOTE — Progress Notes (Signed)
HPI The patient presents as a new patient for me.  He is s/p CABG in 2014.  He was recently in the hospital with sepsis and a retained common bile duct stone. I saw him because he had paroxysmal atrial fibrillation. He spent 20 days in rehabilitation afterwards. He is now doing well. He denies any cardiovascular symptoms such as chest pressure, neck or arm discomfort. He has not had any palpitations, presyncope or syncope. He's not had any new shortness of breath, PND or orthopnea. He still is quite and doesn't have the same exercise tolerance he had previously.   Allergies  Allergen Reactions  . Lipitor [Atorvastatin] Other (See Comments)    "feel bad"  . Penicillins Itching    Current Outpatient Prescriptions  Medication Sig Dispense Refill  . apixaban (ELIQUIS) 2.5 MG TABS tablet Take 1 tablet (2.5 mg total) by mouth 2 (two) times daily. 180 tablet 3  . Cholecalciferol (VITAMIN D3) 1000 UNITS CAPS Take 1,000 Units by mouth daily.     Marland Kitchen diltiazem (DILTIAZEM CD) 240 MG 24 hr capsule Take 1 capsule (240 mg total) by mouth daily. 90 capsule 3  . furosemide (LASIX) 40 MG tablet Take 1 tablet (40 mg total) by mouth daily. 90 tablet 3  . levothyroxine (SYNTHROID, LEVOTHROID) 25 MCG tablet Take 1 tablet (25 mcg total) by mouth daily before breakfast. 90 tablet 3  . omeprazole (PRILOSEC) 40 MG capsule Take 1 capsule (40 mg total) by mouth daily. 90 capsule 3  . oxybutynin (DITROPAN) 5 MG tablet Take 1 tablet (5 mg total) by mouth daily. 90 tablet 3  . potassium chloride (KLOR-CON) 20 MEQ packet Take 30 mEq by mouth daily. Take 30 meq of potassium daily    . pramipexole (MIRAPEX) 1 MG tablet Take 1 tablet (1 mg total) by mouth at bedtime. 90 tablet 3  . pravastatin (PRAVACHOL) 40 MG tablet Take 1 tablet (40 mg total) by mouth daily. 90 tablet 3  . temazepam (RESTORIL) 15 MG capsule Take 15 mg by mouth at bedtime as needed for sleep.     No current facility-administered medications for this  visit.    Past Medical History  Diagnosis Date  . Prostate cancer ([place])   . GERD (gastroesophageal reflux disease)     Large HH with intrathoracic component.   . Hypertension   . Hyperlipemia   . Arthritis   . Inguinal hernia     left  . Coronary artery disease   . Dysrhythmia   . Pneumonia   . H/O hiatal hernia   . CKD (chronic kidney disease) 08/2013    stage 3  . Choledocholithiasis     Past Surgical History  Procedure Laterality Date  . Hernia repair    . Prostate surgery    . Colonoscopy  2003, 01/2012    non-bleeding cecal AVMs, descending and sigmoid tics.   . Coronary artery bypass graft N/A 09/03/2013    Procedure: CORONARY ARTERY BYPASS GRAFTING times four using left internal mammary and bilateral saphenous vein.;  Surgeon: Ivin Poot, MD;  Location: [facility];  Service: Open Heart Surgery;  Laterality: N/A;  . Intraoperative transesophageal echocardiogram N/A 09/03/2013    Procedure: INTRAOPERATIVE TRANSESOPHAGEAL ECHOCARDIOGRAM;  Surgeon: Ivin Poot, MD;  Location: [facility];  Service: Open Heart Surgery;  Laterality: N/A;  . Coronary angioplasty    . Cataract extraction Bilateral   . Total knee arthroplasty Right 03/11/2014    Procedure: TOTAL KNEE ARTHROPLASTY;  Surgeon: Collier Salina  Autumn Patty, MD;  Location: [facility];  Service: Orthopedics;  Laterality: Right;  . Left heart catheterization with coronary angiogram N/A 08/03/2013    Procedure: LEFT HEART CATHETERIZATION WITH CORONARY ANGIOGRAM;  Surgeon: Blane Ohara, MD;  Location: [facility] CATH LAB;  Service: Cardiovascular;  Laterality: N/A;  . Right thoracotomy  As a young child, ? 13 years old    for pneumonia   . Esophagogastroduodenoscopy  01/2012    11 cm sliding hiatal hernia.  . Ercp N/A 04/04/2015    Procedure: ENDOSCOPIC RETROGRADE CHOLANGIOPANCREATOGRAPHY (ERCP);  Surgeon: Inda Castle, MD;  Location: [facility];  Service: Endoscopy;  Laterality: N/A;  . Ercp N/A 04/06/2015    Procedure: ENDOSCOPIC RETROGRADE  CHOLANGIOPANCREATOGRAPHY (ERCP);  Surgeon: Inda Castle, MD;  Location: [facility];  Service: Endoscopy;  Laterality: N/A;    ROS:  As stated in the HPI and negative for all other systems.  PHYSICAL EXAM BP 110/70 mmHg  Pulse 84  Ht 5\' 11"  (1.803 m)  Wt 154 lb (69.854 kg)  BMI 21.49 kg/m2 GENERAL:  Well appearing NECK:  No jugular venous distention, waveform within normal limits, carotid upstroke brisk and symmetric, no bruits, no thyromegaly LUNGS:  Clear to auscultation bilaterally CHEST: Well healed sternotomy scar. HEART:  PMI not displaced or sustained,S1 and S2 within normal limits, no S3, no S4, no clicks, no rubs, 3/6 apical systolic murmur early peaking and radiating out the aortic outflow tract, no diastolic murmurs ABD:  Flat, positive bowel sounds normal in frequency in pitch, no bruits, no rebound, no guarding, no midline pulsatile mass, no hepatomegaly, no splenomegaly (of note he has mild hyperactive bowel sounds with expiration his chest it is very curious likely related to his large hiatal hernia). EXT:  2 plus pulses throughout, no edema, no cyanosis no clubbing   ASSESSMENT AND PLAN  ATRIAL FLUTTER/FIB:   He will continue with anticoagulation. No change in therapy is indicated.  CAD:   The patient has no new sypmtoms.  No further cardiovascular testing is indicated.  We will continue with aggressive risk reduction and meds as listed.  HTN:  His BP is at target.  He'll remain on the meds as listed.

## 2015-05-25 NOTE — Patient Instructions (Signed)
Medication Instructions:  The current medical regimen is effective;  continue present plan and medications.  Follow-Up: Follow up in 1 year with Dr. Hochrein.  You will receive a letter in the mail 2 months before you are due.  Please call us when you receive this letter to schedule your follow up appointment.  Thank you for choosing [facility]!!      

## 2015-06-21 ENCOUNTER — Ambulatory Visit: Payer: Self-pay | Admitting: Surgery

## 2015-06-24 ENCOUNTER — Other Ambulatory Visit: Payer: Self-pay | Admitting: Surgery

## 2015-06-24 DIAGNOSIS — Z8719 Personal history of other diseases of the digestive system: Secondary | ICD-10-CM

## 2015-06-27 ENCOUNTER — Other Ambulatory Visit: Payer: Self-pay | Admitting: *Deleted

## 2015-06-27 MED ORDER — TEMAZEPAM 15 MG PO CAPS: 15.0000 mg | ORAL_CAPSULE | Freq: Every evening | ORAL | Status: DC | PRN

## 2015-06-27 NOTE — Telephone Encounter (Signed)
Patient last seen in office on 05-23-15. Please advise on mail order rx. Route to [employer] [facility] so nurse can phone in to pharmacy. [phone].

## 2015-06-29 ENCOUNTER — Ambulatory Visit
Admission: RE | Admit: 2015-06-29 | Discharge: 2015-06-29 | Disposition: A | Discharge status: Home / Self Care | Payer: Medicare PPO | Source: Ambulatory Visit | Attending: Surgery | Admitting: Surgery

## 2015-06-29 DIAGNOSIS — Z8719 Personal history of other diseases of the digestive system: Secondary | ICD-10-CM

## 2015-07-06 IMAGING — NM NM MYOCAR SINGLE W/SPECT W/WALL MOTION & EF
2 series · 12 of 12 positions shown · non-contrast
Comparison: None.

CLINICAL DATA: 86-year-old male with history of hypertension,
hyperlipidemia, and dyspnea on exertion. He is being considered for
elective knee replacement, and this study is requested to assist
with preoperative evaluation.

EXAM:
MYOCARDIAL IMAGING WITH SPECT (REST AND PHARMACOLOGIC-STRESS)
GATED LEFT VENTRICULAR WALL MOTION STUDY
LEFT VENTRICULAR EJECTION FRACTION
TECHNIQUE: Standard myocardial SPECT imaging was performed after resting
intravenous injection of 10 mCi Vc-RRm sestamibi. Subsequently,
intravenous infusion of Lexiscan was performed under the supervision
of the Cardiology staff. At peak effect of the drug, 30 mCi Vc-RRm
sestamibi was injected intravenously and standard myocardial SPECT
imaging was performed. Quantitative gated imaging was also performed
to evaluate left ventricular wall motion, and estimate left
ventricular ejection fraction.

[cs cardiac tc hi dose · 6.41mm/px · 6 of 512 frames shown]
[frame 43/512]
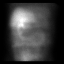
[frame 128/512]
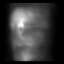
[frame 214/512]
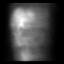
[frame 299/512]
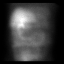
[frame 384/512]
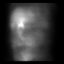
[frame 470/512]
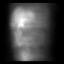

[cr cardiac tc low dose · 6.41mm/px · 6 of 64 frames shown]
[frame 6/64]
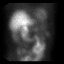
[frame 16/64]
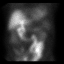
[frame 27/64]
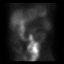
[frame 38/64]
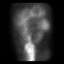
[frame 48/64]
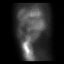
[frame 59/64]
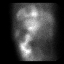

[12 of 12 positions shown; findings below may reference images not displayed]

FINDINGS: Baseline tracing shows sinus bradycardia at 59 beats per min.
Lexiscan bolus was given in standard fashion. Heart rate increased
from 57 beats per min up to 81 beats per min, and blood pressure
remained stable at 149/86. No chest pain was reported. No diagnostic
ST segment abnormalities were noted. Rare PVCs seen without
sustained arrhythmia.

Analysis of the overall perfusion data shows significant radiotracer
uptake within the liver and gut adjacent to the myocardium.

Tomographic views were obtained using the short axis, vertical long
axis, and horizontal long axis planes. There is a small to
moderate-sized, moderate intensity, apical the basal inferior defect
that exhibits partial reversibility and is suggestive of scar with
mild to moderate peri-infarct ischemia. A second small defect of
mild intensity is seen at the apical anteroseptal wall, also
partially reversible and suggestive of mild ischemia.

Gated imaging reveals an EDV of 96, ESV of 28, TID ratio of 0.95,
and LVEF of 71% without focal wall motion abnormalities.
IMPRESSION: Abnormal, but overall low risk Lexiscan Cardiolite. No diagnostic ST
segment abnormalities were noted. There is significant artifact
related to radiotracer uptake within the liver and gut adjacent to
the myocardium which does affect the quality of the study. In this
setting, there is evidence of potential scar with mild to moderate
peri-infarct ischemia in the RCA distribution with defects as
described above. LVEF is 71% without focal wall motion
abnormalities, and normal LV volumes.

## 2015-08-26 ENCOUNTER — Telehealth: Payer: Self-pay | Admitting: Cardiology

## 2015-08-26 NOTE — Telephone Encounter (Signed)
Per office protocol ok to hold Eliquis 3 days prior to surgery.

## 2015-08-26 NOTE — Telephone Encounter (Signed)
Requesting surgical clearance:   1. Type of surgery: Knee replacment  2. Surgeon: Dr. Melrose Nakayama  3. Surgical date: 09/2105 - pending clearance  4. Medications that need to be held: Eliquis  5. CAD: no     6. I will defer to: Hochrein  [facility] Attn; Dr. Rhona Raider [phone] fax

## 2015-08-28 IMAGING — CR DG CHEST 2V
2 series · 2 of 2 positions shown · non-contrast
Comparison: PA and lateral chest x-ray July 27, 2013.

CLINICAL DATA: Preoperative study prior cardiac surgery. , history
of prostate malignancy.

EXAM:
CHEST  2 VIEW

[w chest pa]
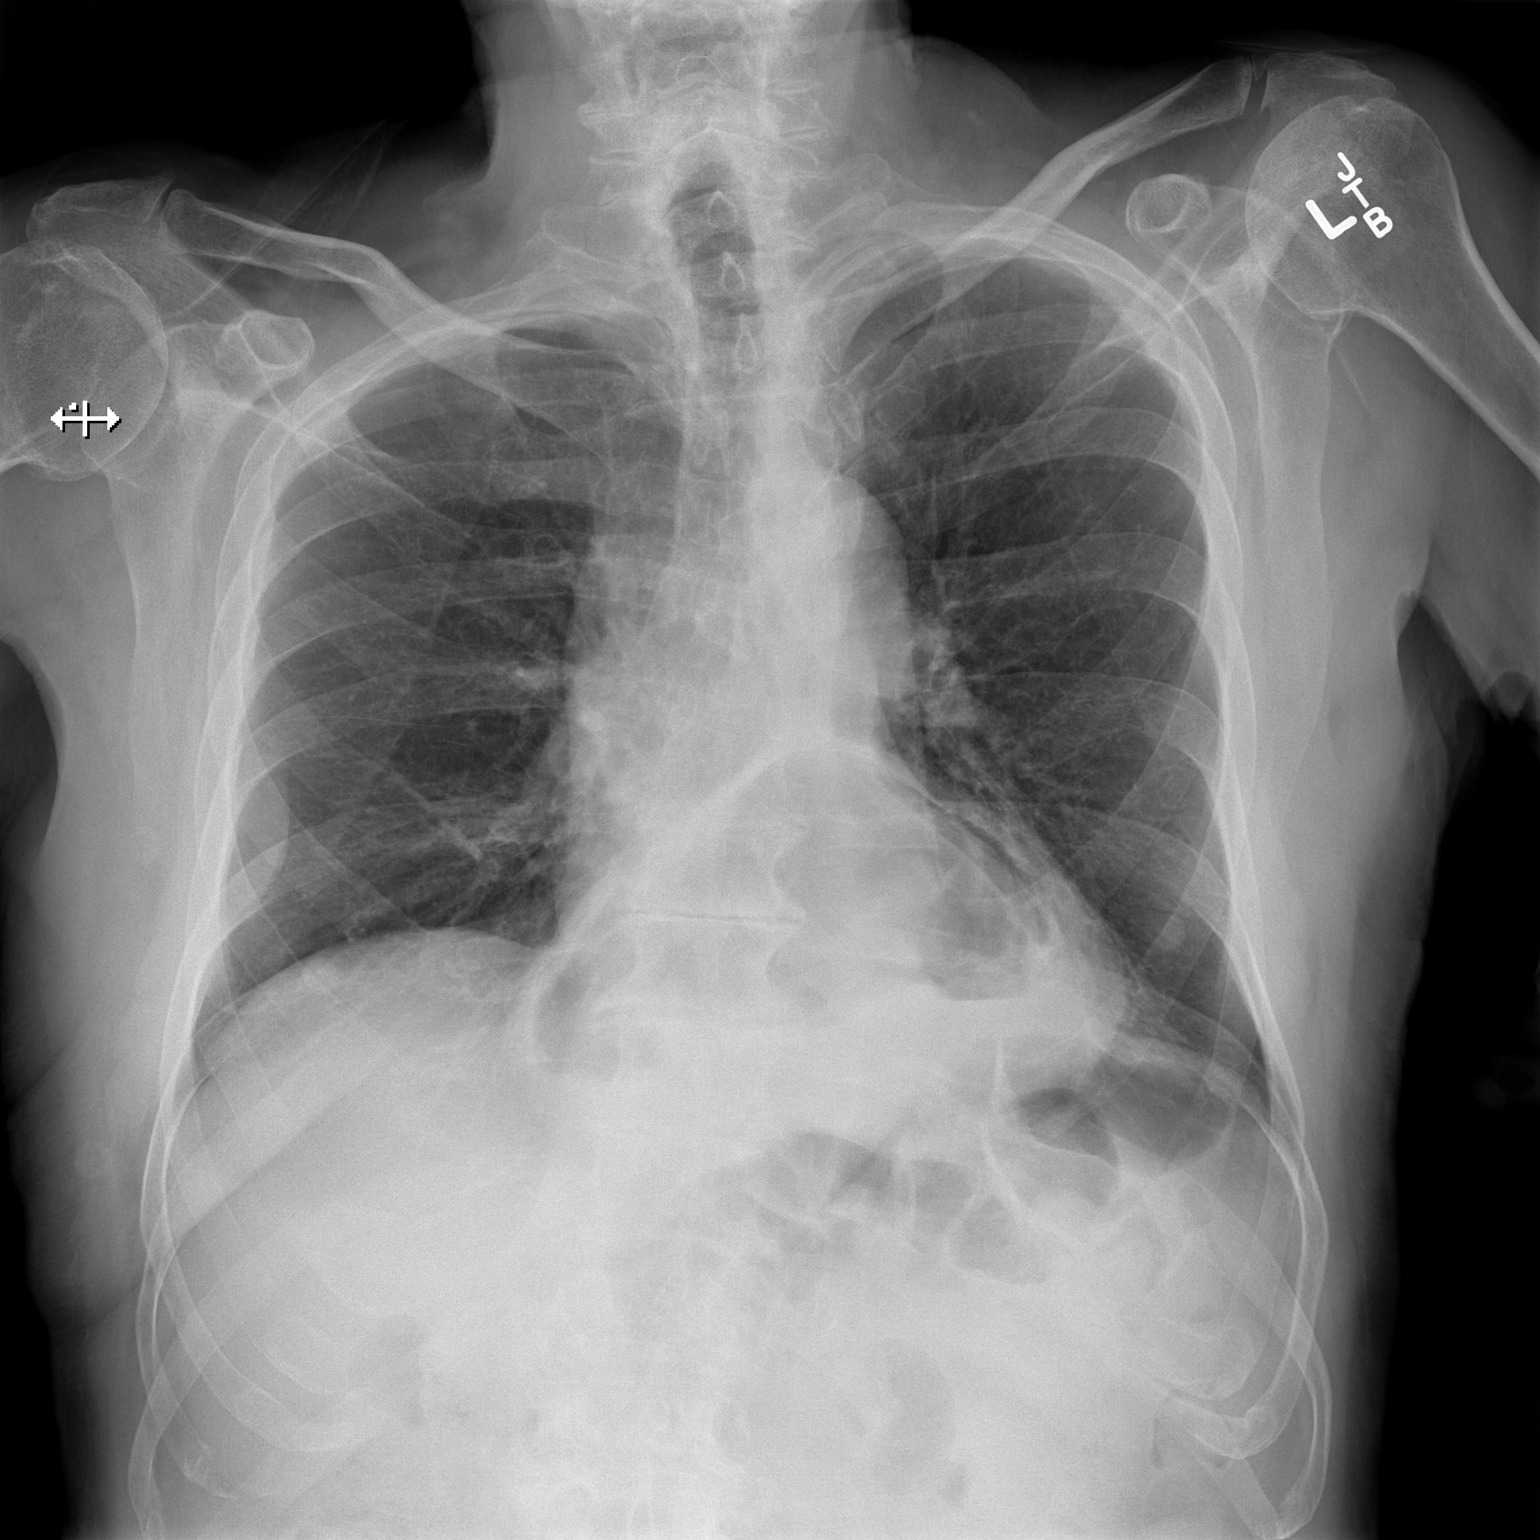

[w chest lat]
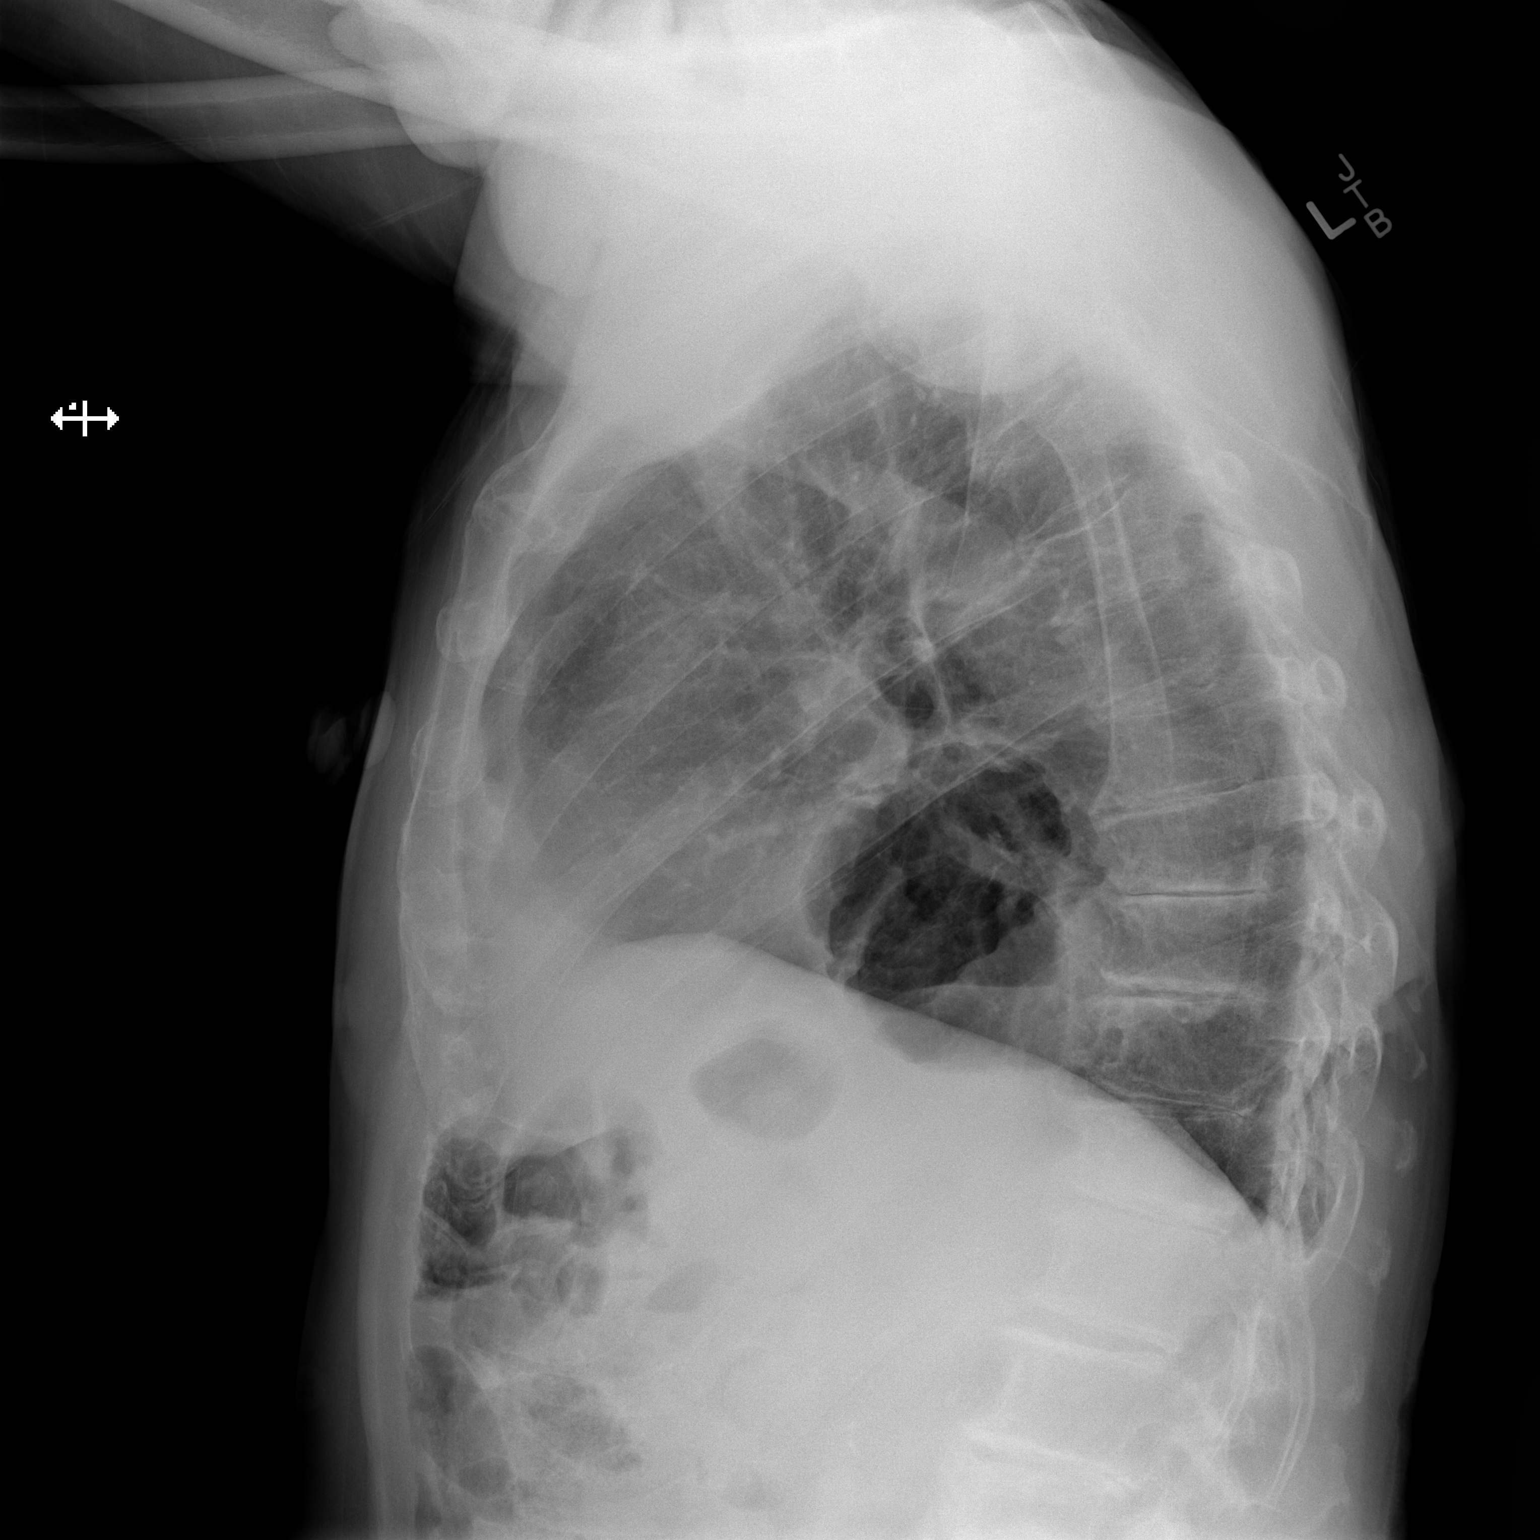

[2 of 2 positions shown; findings below may reference images not displayed]

FINDINGS: Thereis a large hiatal hernia-partially intra thoracic stomach which
appears stable. The lungs are borderline hypoinflated. There is no
focal infiltrate. There is stable pleural thickening along the right
lateral thoracic wall. The cardiac silhouette is normal in size. The
pulmonary vascularity is not engorged. The mediastinum is normal in
width. Nipple shadows are visible bilaterally. The observed portions
of the bony thorax reveal degenerative disc changes at multiple
levels.
IMPRESSION: 1. There is a stable large hiatal hernia-partially intra thoracic
stomach.
2. There is no evidence of pneumonia nor CHF.

## 2015-08-29 NOTE — Telephone Encounter (Signed)
Agree 

## 2015-08-30 IMAGING — CR DG CHEST 1V PORT
1 series · 1 of 1 positions shown · non-contrast
Comparison: None.

CLINICAL DATA: On ventilator.  Postop from CABG.  Prostate cancer.

EXAM:
PORTABLE CHEST - 1 VIEW

[AP]
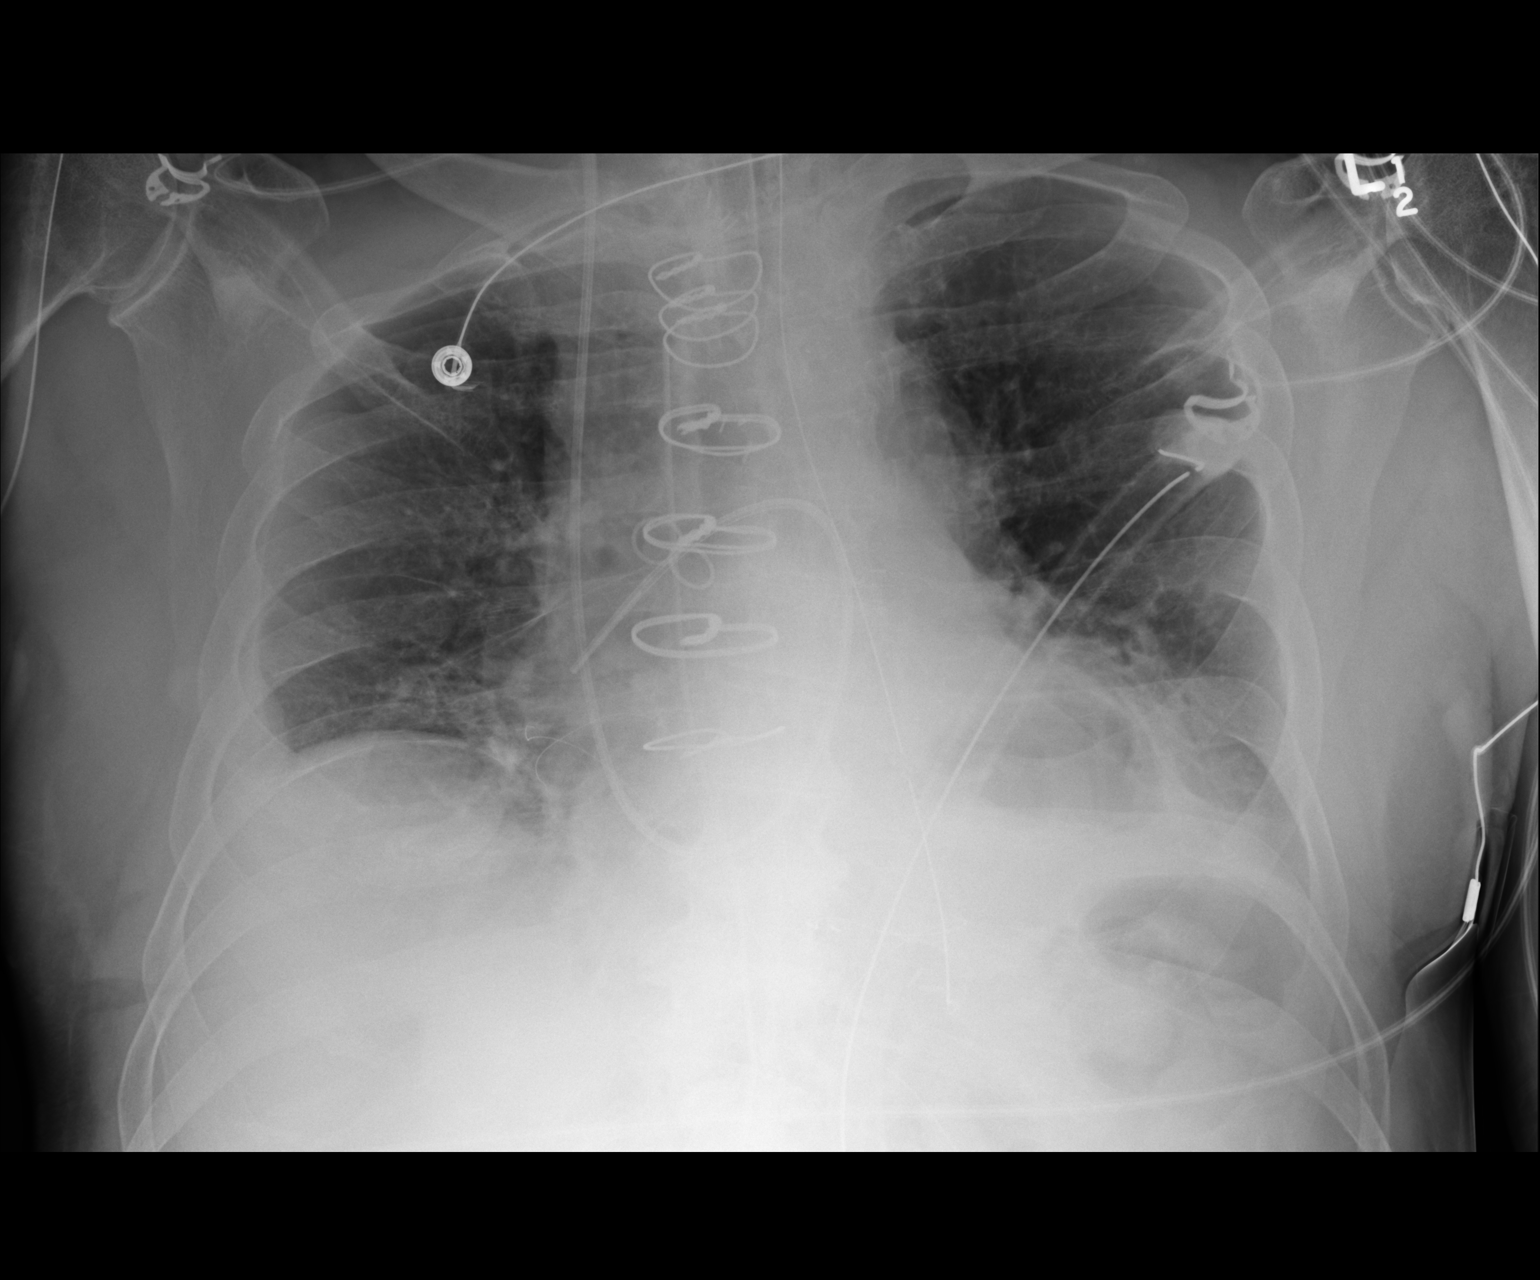

[1 of 1 positions shown; findings below may reference images not displayed]

FINDINGS: Support lines and tubes in appropriate position. No pneumothorax
identified. Low lung volumes again seen. Left lower lobe atelectasis
or infiltrate shows no significant change. Mild right pleural
thickening or effusion also stable. Heart size and mediastinal
contours are also unchanged. Prior CABG noted.
IMPRESSION: Stable low lung volumes with persistent left basilar atelectasis
versus infiltrate. No significant change compared with prior exam.

## 2015-08-30 IMAGING — DX DG CHEST 1V PORT
1 series · 1 of 1 positions shown · non-contrast
Comparison: Chest radiograph September 01, 2013.

CLINICAL DATA: Postoperative CABG.

EXAM:
PORTABLE CHEST - 1 VIEW

[portable]
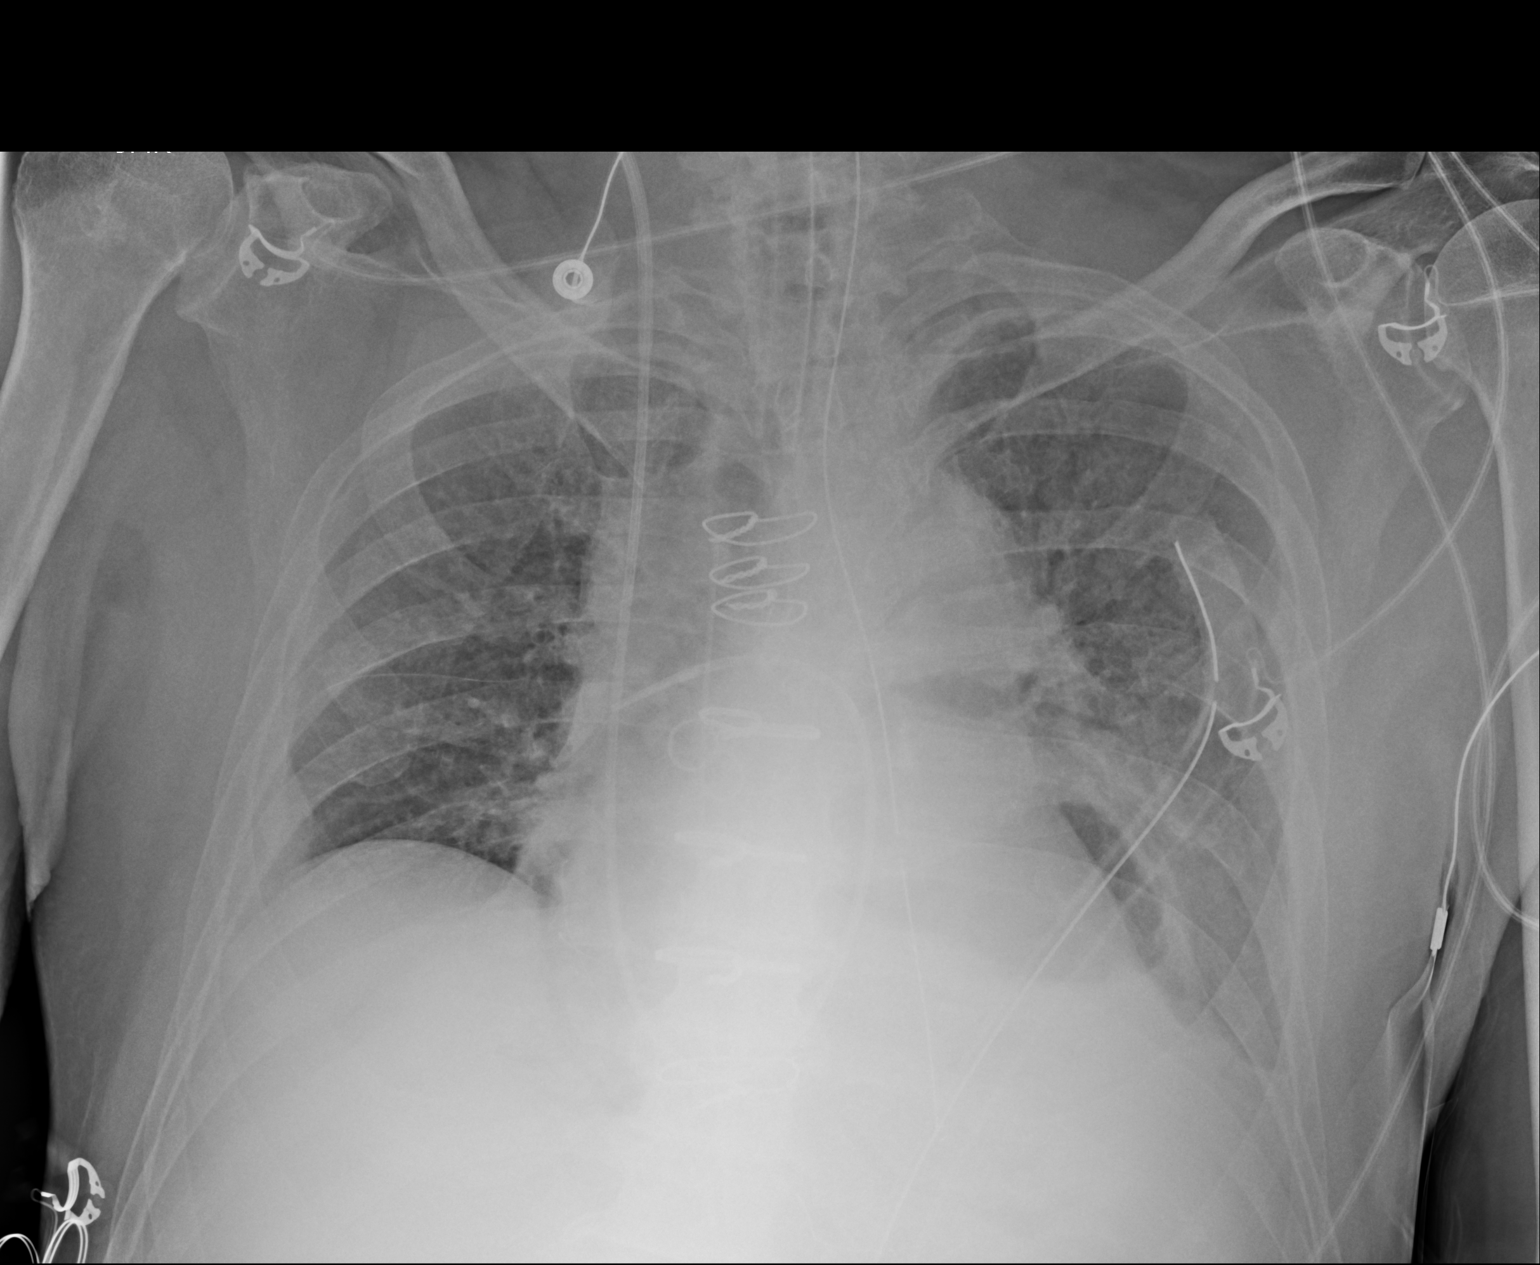

[1 of 1 positions shown; findings below may reference images not displayed]

FINDINGS: Status post interval median sternotomy. Swan-Ganz catheter via right
internal jugular venous approach with distal tip projecting in right
pulmonary artery. Left chest tube with side port projecting within
the chest wall. Mediastinal drain. Endotracheal tube tip projects
3.8 cm above the carina. No pneumothorax. Large hiatal hernia.

Trace blunting of left costophrenic angle not apparent. The cardiac
silhouette appears at least mildly enlarged. Lobulated pleural
thickening at right lung base unchanged. Strandy densities in left
lung base. Multiple EKG lines overlie the patient and may obscure
subtle underlying pathology. Soft tissue planes and included osseous
structures are nonsuspicious.
IMPRESSION: Status post interval median sternotomy, with placement of Swan Ganz
catheter, mediastinal and left chest tubes as well as intubation, in
apparent good position.

Trace left pleural effusion suspected and left lung base
atelectasis.

  By: Carvell Boparai

## 2015-08-31 NOTE — Telephone Encounter (Signed)
Pt clearance faxed to Dr. Rhona Raider office

## 2015-08-31 NOTE — Telephone Encounter (Signed)
The patient would not have any absolute cardiovascular contraindications to surgery. However, he would be at moderate risk from an overall frail health standpoint.  No further cardiovascular testing would be indicated prior to surgery.

## 2015-09-05 ENCOUNTER — Telehealth: Payer: Self-pay | Admitting: Family Medicine

## 2015-09-05 NOTE — Telephone Encounter (Signed)
Patient appointment moved to 1/19 with Laurance Flatten.

## 2015-09-07 ENCOUNTER — Other Ambulatory Visit: Payer: Self-pay | Admitting: Orthopaedic Surgery

## 2015-09-08 ENCOUNTER — Ambulatory Visit (INDEPENDENT_AMBULATORY_CARE_PROVIDER_SITE_OTHER): Payer: Medicare PPO | Admitting: Family Medicine

## 2015-09-08 ENCOUNTER — Encounter: Payer: Self-pay | Admitting: Family Medicine

## 2015-09-08 VITALS — BP 131/72 | HR 76 | Temp 96.7°F | Ht 71.0 in | Wt 166.0 lb

## 2015-09-08 DIAGNOSIS — E559 Vitamin D deficiency, unspecified: Secondary | ICD-10-CM

## 2015-09-08 DIAGNOSIS — M1712 Unilateral primary osteoarthritis, left knee: Secondary | ICD-10-CM

## 2015-09-08 DIAGNOSIS — E785 Hyperlipidemia, unspecified: Secondary | ICD-10-CM

## 2015-09-08 DIAGNOSIS — E291 Testicular hypofunction: Secondary | ICD-10-CM | POA: Diagnosis not present

## 2015-09-08 DIAGNOSIS — C61 Malignant neoplasm of prostate: Secondary | ICD-10-CM

## 2015-09-08 DIAGNOSIS — I4891 Unspecified atrial fibrillation: Secondary | ICD-10-CM | POA: Diagnosis not present

## 2015-09-08 DIAGNOSIS — I4892 Unspecified atrial flutter: Secondary | ICD-10-CM | POA: Diagnosis not present

## 2015-09-08 DIAGNOSIS — I10 Essential (primary) hypertension: Secondary | ICD-10-CM

## 2015-09-08 DIAGNOSIS — E349 Endocrine disorder, unspecified: Secondary | ICD-10-CM

## 2015-09-08 MED ORDER — APIXABAN 2.5 MG PO TABS: 2.5000 mg | ORAL_TABLET | Freq: Two times a day (BID) | ORAL | Status: DC

## 2015-09-08 MED ORDER — PRAMIPEXOLE DIHYDROCHLORIDE 1 MG PO TABS: 1.0000 mg | ORAL_TABLET | Freq: Every day | ORAL | Status: DC

## 2015-09-08 NOTE — Patient Instructions (Addendum)
Medicare Annual Wellness Visit  [facility] and the medical providers at [facility] strive to bring you the best medical care.  In doing so we not only want to address your current medical conditions and concerns but also to detect new conditions early and prevent illness, disease and health-related problems.    Medicare offers a yearly Wellness Visit which allows our clinical staff to assess your need for preventative services including immunizations, lifestyle education, counseling to decrease risk of preventable diseases and screening for fall risk and other medical concerns.    This visit is provided free of charge (no copay) for all Medicare recipients. The clinical pharmacists at [facility] have begun to conduct these Wellness Visits which will also include a thorough review of all your medications.    As you primary medical provider recommend that you make an appointment for your Annual Wellness Visit if you have not done so already this year.  You may set up this appointment before you leave today or you may call back [phone] and schedule an appointment.  Please make sure when you call that you mention that you are scheduling your Annual Wellness Visit with the clinical pharmacist so that the appointment may be made for the proper length of time.     Continue current medications. Continue good therapeutic lifestyle changes which include good diet and exercise. Fall precautions discussed with patient. If an FOBT was given today- please return it to our front desk. If you are over 80 years old - you may need Prevnar 60 or the adult Pneumonia vaccine.  **Flu shots are available--- please call and schedule a FLU-CLINIC appointment**  After your visit with [place] today you will receive a survey in the mail or online from [employer] regarding your care with [place]. Please take a moment to fill this out. Your feedback is very  important to [place] as you can help [place] better understand your patient needs as well as improve your experience and satisfaction. WE CARE ABOUT YOU!!!   The patient should follow-up with orthopedic surgery as planned prior to his knee replacement on the left. We will call with the lab results and send a copy to the orthopedic specialist with these results are returned He should highly consider going to a rehabilitation facility following the surgery at least for a few days before he goes home Pulse ox today was good. The surgery is planned for January 31.

## 2015-09-08 NOTE — Progress Notes (Signed)
Subjective:    Patient ID: [name], male    DOB: 1927/02/05, 82 y.o.   MRN: [id number]  HPI Pt here for follow up and management of chronic medical problems which includes hypertension and hyperlipidemia. He is taking medications regularly. The patient today complains of some shortness of breath and weakness. He is due to get lab work and he is due for rectal exam and a urine specimen. He is also requesting refills on his Mirapex and eliquis. The patient brings in home blood pressure readings for review and these are all excellent. The patient's biggest complaint is shortness of breath. He is also discuss this with the cardiologist and no significant abnormalities were noted by the cardiologist. He plans to get a chest x-ray prior to the surgery. His pulse ox today on entering the exam room was 98%. He does say that his shortness of breath is improving. He denies any chest pain. He denies any heartburn indigestion and nausea vomiting diarrhea or blood in the stool. He's passing his water without problems. His primary concern is his left knee pain and he says it is especially bad with arising in the morning and gets loosened up some during the day. He also has a skin lesion in the left ear that he would like looked at.     Patient Active Problem List   Diagnosis Date Noted  . Anemia 04/15/2015  . Anemia, chronic renal failure 04/15/2015  . Hypokalemia 04/15/2015  . Edema 04/12/2015  . Hyperbilirubinemia   . Choledocholithiasis 04/06/2015  . Encounter for nasogastric (NG) tube placement   . Hypoxia   . Abdominal pain, epigastric   . Abnormal liver enzymes   . Abnormal LFTs   . Biliary sepsis   . Acute respiratory failure ([place])   . Severe sepsis ([place])   . Bacteremia due to Gram-negative bacteria ([place])   . Hiatal hernia   . Sepsis ([place]) 03/28/2015  . Incisional hernia, without obstruction or gangrene 08/17/2014  . Atrial flutter ([place]) 07/28/2014  . Palpitation 06/16/2014  . Pain in  the chest 06/10/2014  . Right knee DJD 03/11/2014  . S/P CABG x 4 09/03/2013  . Insomnia 07/27/2013  . Testosterone deficiency 07/27/2013  . Osteoarthritis of right knee 07/27/2013  . Hyperlipidemia 06/10/2013  . Heart murmur 06/10/2013  . Dyspnea 06/10/2013  . Inguinal hernia, left. 04/04/2012  . Paraesophageal hiatal hernia 03/24/2012  . Prostate cancer ([place]) 01/15/2012  . Special screening for malignant neoplasms, colon 01/15/2012   Outpatient Encounter Prescriptions as of 09/08/2015  Medication Sig  . apixaban (ELIQUIS) 2.5 MG TABS tablet Take 1 tablet (2.5 mg total) by mouth 2 (two) times daily.  . Cholecalciferol (VITAMIN D3) 1000 UNITS CAPS Take 1,000 Units by mouth daily.   Marland Kitchen diltiazem (DILTIAZEM CD) 240 MG 24 hr capsule Take 1 capsule (240 mg total) by mouth daily.  . furosemide (LASIX) 40 MG tablet Take 1 tablet (40 mg total) by mouth daily.  Marland Kitchen levothyroxine (SYNTHROID, LEVOTHROID) 25 MCG tablet Take 1 tablet (25 mcg total) by mouth daily before breakfast.  . omeprazole (PRILOSEC) 40 MG capsule Take 1 capsule (40 mg total) by mouth daily.  Marland Kitchen oxybutynin (DITROPAN) 5 MG tablet Take 1 tablet (5 mg total) by mouth daily.  . potassium chloride (KLOR-CON) 20 MEQ packet Take 30 mEq by mouth daily. Take 30 meq of potassium daily  . pramipexole (MIRAPEX) 1 MG tablet Take 1 tablet (1 mg total) by mouth at bedtime.  . pravastatin (  PRAVACHOL) 40 MG tablet Take 1 tablet (40 mg total) by mouth daily.  . temazepam (RESTORIL) 15 MG capsule Take 1 capsule (15 mg total) by mouth at bedtime as needed for sleep.   No facility-administered encounter medications on file as of 09/08/2015.      Review of Systems  HENT: Negative.   Eyes: Negative.   Respiratory: Positive for shortness of breath.   Cardiovascular: Negative.   Gastrointestinal: Negative.   Endocrine: Negative.   Genitourinary: Negative.   Musculoskeletal: Negative.   Skin: Negative.   Allergic/Immunologic: Negative.     Neurological: Positive for weakness.  Hematological: Negative.   Psychiatric/Behavioral: Negative.        Objective:   Physical Exam  Constitutional: He is oriented to person, place, and time. He appears well-developed and well-nourished. No distress.  The patient is alert and younger appearing than his stated age.  HENT:  Head: Normocephalic and atraumatic.  Right Ear: External ear normal.  Left Ear: External ear normal.  Nose: Nose normal.  Mouth/Throat: Oropharynx is clear and moist. No oropharyngeal exudate.  The skin lesion in the left ear appears to be a seborrheic keratosis and we told him we would continue to monitor this on future exams.  Eyes: Conjunctivae and EOM are normal. Pupils are equal, round, and reactive to light. Right eye exhibits no discharge. Left eye exhibits no discharge. No scleral icterus.  Neck: Normal range of motion. Neck supple. No thyromegaly present.  Cardiovascular: Normal rate, regular rhythm and intact distal pulses.   Murmur heard. Heart is regular at 72/m with a grade 2/6 systolic ejection murmur  Pulmonary/Chest: Effort normal and breath sounds normal. No respiratory distress. He has no wheezes. He has no rales. He exhibits no tenderness.  The lungs are clear anteriorly and posteriorly there is no axillary adenopathy  Abdominal: Soft. Bowel sounds are normal. He exhibits no mass. There is no tenderness. There is no rebound and no guarding.  There is no liver or spleen enlargement no epigastric tenderness and good inguinal pulses without adenopathy  Genitourinary: Rectum normal and penis normal.  The prostate vault is empty and there were no rectal masses. There are no inguinal hernias. External genitalia appeared normal.  Musculoskeletal: He exhibits tenderness. He exhibits no edema.  There is stiffness and swelling in the left knee.  Lymphadenopathy:    He has no cervical adenopathy.  Neurological: He is alert and oriented to person, place,  and time. He has normal reflexes. No cranial nerve deficit.  Skin: Skin is warm and dry. No rash noted.  Psychiatric: He has a normal mood and affect. His behavior is normal. Judgment and thought content normal.  Nursing note and vitals reviewed.         Assessment & Plan:  1. Essential hypertension -Blood pressure is good today and the patient will continue with his current treatment. - Hepatic function panel - Brain natriuretic peptide  2. Testosterone deficiency - Brain natriuretic peptide  3. Hyperlipidemia -Continue with pravastatin pending results of lab work - Lipid panel - Brain natriuretic peptide  4. Vitamin D deficiency -Continue with vitamin D pending results of lab work - VITAMIN D 25 Hydroxy (Vit-D Deficiency, Fractures) - Brain natriuretic peptide  5. Atrial fibrillation, unspecified -Continue regular follow-up with cardiology and the rhythm was regular today in the office at about 72/m - Brain natriuretic peptide  6. Prostate cancer ([place]) -There is no indication of any recurrence of prostate cancer and the ball on rectal exam  was empty. - Brain natriuretic peptide  7. Atrial flutter, unspecified type ([place]) -Heart was regular today. - Brain natriuretic peptide  8. Primary osteoarthritis of left knee -Follow-up with orthopedist as planned for knee replacement and preop.  Patient Instructions                       Medicare Annual Wellness Visit  [facility] and the medical providers at [facility] strive to bring you the best medical care.  In doing so we not only want to address your current medical conditions and concerns but also to detect new conditions early and prevent illness, disease and health-related problems.    Medicare offers a yearly Wellness Visit which allows our clinical staff to assess your need for preventative services including immunizations, lifestyle education, counseling to decrease risk of preventable  diseases and screening for fall risk and other medical concerns.    This visit is provided free of charge (no copay) for all Medicare recipients. The clinical pharmacists at [facility] have begun to conduct these Wellness Visits which will also include a thorough review of all your medications.    As you primary medical provider recommend that you make an appointment for your Annual Wellness Visit if you have not done so already this year.  You may set up this appointment before you leave today or you may call back [phone] and schedule an appointment.  Please make sure when you call that you mention that you are scheduling your Annual Wellness Visit with the clinical pharmacist so that the appointment may be made for the proper length of time.     Continue current medications. Continue good therapeutic lifestyle changes which include good diet and exercise. Fall precautions discussed with patient. If an FOBT was given today- please return it to our front desk. If you are over 76 years old - you may need Prevnar 26 or the adult Pneumonia vaccine.  **Flu shots are available--- please call and schedule a FLU-CLINIC appointment**  After your visit with [place] today you will receive a survey in the mail or online from [employer] regarding your care with [place]. Please take a moment to fill this out. Your feedback is very important to [place] as you can help [place] better understand your patient needs as well as improve your experience and satisfaction. WE CARE ABOUT YOU!!!   The patient should follow-up with orthopedic surgery as planned prior to his knee replacement on the left. We will call with the lab results and send a copy to the orthopedic specialist with these results are returned He should highly consider going to a rehabilitation facility following the surgery at least for a few days before he goes home Pulse ox today was good. The surgery is planned for January 31.   Arrie Senate MD

## 2015-09-09 LAB — HEPATIC FUNCTION PANEL
ALBUMIN: 4.2 g/dL (ref 3.5–4.7)
ALT: 20 IU/L (ref 0–44)
AST: 24 IU/L (ref 0–40)
Alkaline Phosphatase: 124 IU/L — ABNORMAL HIGH (ref 39–117)
BILIRUBIN TOTAL: 0.2 mg/dL (ref 0.0–1.2)
Bilirubin, Direct: 0.1 mg/dL (ref 0.00–0.40)
TOTAL PROTEIN: 6.8 g/dL (ref 6.0–8.5)

## 2015-09-09 LAB — LIPID PANEL
CHOL/HDL RATIO: 3.5 ratio (ref 0.0–5.0)
Cholesterol, Total: 136 mg/dL (ref 100–199)
HDL: 39 mg/dL — AB (ref >39)
LDL Calculated: 60 mg/dL (ref 0–99)
TRIGLYCERIDES: 183 mg/dL — AB (ref 0–149)
VLDL Cholesterol Cal: 37 mg/dL (ref 5–40)

## 2015-09-09 LAB — BRAIN NATRIURETIC PEPTIDE: BNP: 99.6 pg/mL (ref 0.0–100.0)

## 2015-09-09 LAB — VITAMIN D 25 HYDROXY (VIT D DEFICIENCY, FRACTURES): VIT D 25 HYDROXY: 37 ng/mL (ref 30.0–100.0)

## 2015-09-13 ENCOUNTER — Ambulatory Visit (HOSPITAL_COMMUNITY)
Admission: RE | Admit: 2015-09-13 | Discharge: 2015-09-13 | Disposition: A | Discharge status: Home / Self Care | Payer: Medicare PPO | Source: Ambulatory Visit | Attending: Orthopaedic Surgery | Admitting: Orthopaedic Surgery

## 2015-09-13 ENCOUNTER — Encounter (HOSPITAL_COMMUNITY)
Admission: RE | Admit: 2015-09-13 | Discharge: 2015-09-13 | Disposition: A | Discharge status: Home / Self Care | Payer: Medicare PPO | Source: Ambulatory Visit | Attending: Orthopaedic Surgery | Admitting: Orthopaedic Surgery

## 2015-09-13 ENCOUNTER — Encounter (HOSPITAL_COMMUNITY): Payer: Self-pay

## 2015-09-13 DIAGNOSIS — Z01812 Encounter for preprocedural laboratory examination: Secondary | ICD-10-CM | POA: Diagnosis not present

## 2015-09-13 DIAGNOSIS — Z01818 Encounter for other preprocedural examination: Secondary | ICD-10-CM

## 2015-09-13 DIAGNOSIS — Z87891 Personal history of nicotine dependence: Secondary | ICD-10-CM | POA: Insufficient documentation

## 2015-09-13 DIAGNOSIS — I517 Cardiomegaly: Secondary | ICD-10-CM | POA: Insufficient documentation

## 2015-09-13 DIAGNOSIS — K449 Diaphragmatic hernia without obstruction or gangrene: Secondary | ICD-10-CM | POA: Diagnosis not present

## 2015-09-13 DIAGNOSIS — Z951 Presence of aortocoronary bypass graft: Secondary | ICD-10-CM | POA: Insufficient documentation

## 2015-09-13 DIAGNOSIS — E785 Hyperlipidemia, unspecified: Secondary | ICD-10-CM | POA: Insufficient documentation

## 2015-09-13 DIAGNOSIS — N183 Chronic kidney disease, stage 3 (moderate): Secondary | ICD-10-CM | POA: Insufficient documentation

## 2015-09-13 DIAGNOSIS — I129 Hypertensive chronic kidney disease with stage 1 through stage 4 chronic kidney disease, or unspecified chronic kidney disease: Secondary | ICD-10-CM | POA: Insufficient documentation

## 2015-09-13 DIAGNOSIS — Z79899 Other long term (current) drug therapy: Secondary | ICD-10-CM | POA: Insufficient documentation

## 2015-09-13 DIAGNOSIS — Z7901 Long term (current) use of anticoagulants: Secondary | ICD-10-CM | POA: Insufficient documentation

## 2015-09-13 HISTORY — DX: Hypothyroidism, unspecified: E03.9

## 2015-09-13 HISTORY — DX: Unspecified atrial fibrillation: I48.91

## 2015-09-13 HISTORY — DX: Unspecified atrial flutter: I48.92

## 2015-09-13 LAB — CBC WITH DIFFERENTIAL/PLATELET
BASOS ABS: 0 10*3/uL (ref 0.0–0.1)
BASOS PCT: 0 %
EOS ABS: 0.1 10*3/uL (ref 0.0–0.7)
Eosinophils Relative: 1 %
HEMATOCRIT: 37.4 % — AB (ref 39.0–52.0)
HEMOGLOBIN: 12.5 g/dL — AB (ref 13.0–17.0)
Lymphocytes Relative: 13 %
Lymphs Abs: 1.1 10*3/uL (ref 0.7–4.0)
MCH: 31.8 pg (ref 26.0–34.0)
MCHC: 33.4 g/dL (ref 30.0–36.0)
MCV: 95.2 fL (ref 78.0–100.0)
Monocytes Absolute: 0.7 10*3/uL (ref 0.1–1.0)
Monocytes Relative: 9 %
NEUTROS ABS: 6.3 10*3/uL (ref 1.7–7.7)
NEUTROS PCT: 77 %
Platelets: 255 10*3/uL (ref 150–400)
RBC: 3.93 MIL/uL — ABNORMAL LOW (ref 4.22–5.81)
RDW: 13.1 % (ref 11.5–15.5)
WBC: 8.2 10*3/uL (ref 4.0–10.5)

## 2015-09-13 LAB — URINALYSIS, ROUTINE W REFLEX MICROSCOPIC
Bilirubin Urine: NEGATIVE
GLUCOSE, UA: NEGATIVE mg/dL
Hgb urine dipstick: NEGATIVE
Ketones, ur: NEGATIVE mg/dL
LEUKOCYTES UA: NEGATIVE
Nitrite: NEGATIVE
PROTEIN: NEGATIVE mg/dL
SPECIFIC GRAVITY, URINE: 1.008 (ref 1.005–1.030)
pH: 6 (ref 5.0–8.0)

## 2015-09-13 LAB — BASIC METABOLIC PANEL
ANION GAP: 13 (ref 5–15)
BUN: 26 mg/dL — ABNORMAL HIGH (ref 6–20)
CALCIUM: 10.4 mg/dL — AB (ref 8.9–10.3)
CO2: 25 mmol/L (ref 22–32)
Chloride: 103 mmol/L (ref 101–111)
Creatinine, Ser: 1.67 mg/dL — ABNORMAL HIGH (ref 0.61–1.24)
GFR, EST AFRICAN AMERICAN: 41 mL/min — AB (ref >60)
GFR, EST NON AFRICAN AMERICAN: 35 mL/min — AB (ref >60)
Glucose, Bld: 103 mg/dL — ABNORMAL HIGH (ref 65–99)
Potassium: 3.9 mmol/L (ref 3.5–5.1)
SODIUM: 141 mmol/L (ref 135–145)

## 2015-09-13 LAB — TYPE AND SCREEN
ABO/RH(D): AB NEG
Antibody Screen: NEGATIVE

## 2015-09-13 LAB — APTT: APTT: 37 s (ref 24–37)

## 2015-09-13 LAB — PROTIME-INR
INR: 1.2 (ref 0.00–1.49)
PROTHROMBIN TIME: 15.4 s — AB (ref 11.6–15.2)

## 2015-09-13 LAB — SURGICAL PCR SCREEN
MRSA, PCR: NEGATIVE
Staphylococcus aureus: NEGATIVE

## 2015-09-13 NOTE — Progress Notes (Signed)
Will leave chart for anesth. Review, wanting anesth. To be aware of Dr. Rosezella [place] note.

## 2015-09-13 NOTE — Pre-Procedure Instructions (Addendum)
[name]  09/13/2015      [address] [place] [address] Phone: [phone] Fax: [phone]  [facility] [address] Phone: [phone] Fax: [phone]    Your procedure is scheduled on 09/20/2015.  Report to [facility] Admitting at 11:00 A.M.  Call this number if you have problems the morning of surgery:  [phone]   Remember:  Do not eat food or drink liquids after midnight.  On Monday   Take these medicines the morning of surgery with A SIP OF WATER :DILTIAZEM, THYROID MEDICINE, OXYBUTYNIN, OMEPRAZOLE                          NO ELIQUIS AFTER Friday     Do not wear jewelry   Do not wear lotions, powders, or perfumes.  You may wear deodorant.              Men may shave face and neck.   Do not bring valuables to the hospital.   [facility] is not responsible for any belongings or valuables.  Contacts, dentures or bridgework may not be worn into surgery.  Leave your suitcase in the car.  After surgery it may be brought to your room.  For patients admitted to the hospital, discharge time will be determined by your treatment team.  Patients discharged the day of surgery will not be allowed to drive home.   Name and phone number of your driver:  With family  Special instructions:  Special Instructions: [facility] - Preparing for Surgery  Before surgery, you can play an important role.  Because skin is not sterile, your skin needs to be as free of germs as possible.  You can reduce the number of germs on you skin by washing with CHG (chlorahexidine gluconate) soap before surgery.  CHG is an antiseptic cleaner which kills germs and bonds with the skin to continue killing germs even after washing.  Please DO NOT use if you have an allergy to CHG or antibacterial soaps.  If your skin becomes  reddened/irritated stop using the CHG and inform your nurse when you arrive at Short Stay.  Do not shave (including legs and underarms) for at least 48 hours prior to the first CHG shower.  You may shave your face.  Please follow these instructions carefully:   1.  Shower with CHG Soap the night before surgery and the  morning of Surgery.  2.  If you choose to wash your hair, wash your hair first as usual with your  normal shampoo.  3.  After you shampoo, rinse your hair and body thoroughly to remove the  Shampoo.  4.  Use CHG as you would any other liquid soap.  You can apply chg directly to the skin and wash gently with scrungie or a clean washcloth.  5.  Apply the CHG Soap to your body ONLY FROM THE NECK DOWN.    Do not use on open wounds or open sores.  Avoid contact with your eyes, ears, mouth and genitals (private parts).  Wash genitals (private parts)   with your normal soap.  6.  Wash thoroughly, paying special attention to the area where your surgery will be performed.  7.  Thoroughly rinse your body with warm water from the neck down.  8.  DO NOT shower/wash with your normal soap after using and rinsing off   the CHG Soap.  9.  Pat yourself dry with a clean towel.            10.  Wear clean pajamas.            11.  Place clean sheets on your bed the night of your first shower and do not sleep with pets.  Day of Surgery  Do not apply any lotions/deodorants the morning of surgery.  Please wear clean clothes to the hospital/surgery center.  Please read over the following fact sheets that you were given. Pain Booklet, Coughing and Deep Breathing, Blood Transfusion Information, Total Joint Packet, MRSA Information and Surgical Site Infection Prevention

## 2015-09-14 ENCOUNTER — Encounter (HOSPITAL_COMMUNITY): Payer: Self-pay

## 2015-09-14 NOTE — Progress Notes (Signed)
Anesthesia Chart Review:  Pt is 82 year old male scheduled for L total knee arthroplasty on 09/20/2015 with Dr. Rhona Raider.   PCP is Dr. [name] who is aware of upcoming surgery. Cardiologist is Dr. Minus Breeding.   PMH includes:  CAD (s/p CABG 09/04/13: LIMA to LAD, SVG to diagonal, sequential SVG to OM1 and OM2), PAF/atrial flutter, HTN, hyperlipidemia, CKD (stage 3), hypothyroidism, prostate cancer, GERD. Former smoker. BMI 23. S/p ERCP 04/06/15 and 04/04/15. S/p R TKA 03/11/14.   Medications include: eliquis, diltiazem, lasix, levothyroxine, prilosec, pravastatin. Pt to stop eliquis 09/16/15.   Preoperative labs reviewed.  Cr 1.67, BUN 26. PT 15.4.   Chest x-ray 09/13/15 reviewed.  1. Stable cardiomegaly without pulmonary vascular congestion. Stable post CABG changes. 2. There is no evidence of pneumonia. 3. Stable large hiatal hernia-partially intra thoracic stomach.  EKG 04/02/15: Sinus rhythm with 1st degree A-V block. Left axis deviation  Echo 03/30/15:  - Left ventricle: The cavity size was normal. Blasko thickness was normal. Systolic function was normal. The estimated ejection fraction was in the range of 55% to 60%. Upton motion was normal;there were no regional Magno motion abnormalities. Features are consistent with a pseudonormal left ventricular filling pattern,with concomitant abnormal relaxation and increased filling pressure (grade 2 diastolic dysfunction). - Mitral valve: Calcified annulus. There was mild regurgitation. - Left atrium: The atrium was mildly to moderately dilated. - Right ventricle: The cavity size was mildly dilated. - Right atrium: The atrium was moderately dilated. - Tricuspid valve: There was moderate regurgitation. - Pulmonary arteries: Systolic pressure was moderately increased. PA peak pressure: 46 mm Hg (S).  Carotid duplex 09/01/13: No significant (1-39%) ICA stenosis noted.   Pt has cardiac clearance for surgery from Dr. Percival Spanish in [place] telephone  encounter dated 08/31/15. He notes "The patient would not have any absolute cardiovascular contraindications to surgery. However, he would be at moderate risk from an overall frail health standpoint. No further cardiovascular testing would be indicated prior to surgery."  If no changes, I anticipate pt can proceed with surgery as scheduled.   Willeen Cass, FNP-BC [facility] Short Stay Surgical Center/Anesthesiology Phone: [phone] 09/14/2015 10:56 AM

## 2015-09-19 MED ORDER — LACTATED RINGERS IV SOLN: INTRAVENOUS | Status: DC

## 2015-09-19 MED ORDER — VANCOMYCIN HCL IN DEXTROSE 1-5 GM/200ML-% IV SOLN
1000.0000 mg | INTRAVENOUS | Status: AC
  Administered 2015-09-20: 1000 mg via INTRAVENOUS
  Filled 2015-09-19: qty 200

## 2015-09-19 MED ORDER — CHLORHEXIDINE GLUCONATE 4 % EX LIQD: 60.0000 mL | Freq: Once | CUTANEOUS | Status: DC

## 2015-09-19 NOTE — H&P (Signed)
TOTAL KNEE ADMISSION H&P  Patient is being admitted for left total knee arthroplasty.  Subjective:  Chief Complaint:left knee pain.  HPI: [name], 82 y.o. male, has a history of pain and functional disability in the left knee due to arthritis and has failed non-surgical conservative treatments for greater than 12 weeks to includeNSAID's and/or analgesics, corticosteriod injections, flexibility and strengthening excercises, weight reduction as appropriate and activity modification.  Onset of symptoms was gradual, starting 5 years ago with gradually worsening course since that time. The patient noted no past surgery on the left knee(s).  Patient currently rates pain in the left knee(s) at 10 out of 10 with activity. Patient has night pain, worsening of pain with activity and weight bearing, pain that interferes with activities of daily living, crepitus and joint swelling.  Patient has evidence of subchondral cysts, subchondral sclerosis, periarticular osteophytes and joint space narrowing by imaging studies. There is no active infection.  Patient Active Problem List   Diagnosis Date Noted  . Anemia 04/15/2015  . Anemia, chronic renal failure 04/15/2015  . Hypokalemia 04/15/2015  . Edema 04/12/2015  . Hyperbilirubinemia   . Choledocholithiasis 04/06/2015  . Encounter for nasogastric (NG) tube placement   . Hypoxia   . Abdominal pain, epigastric   . Abnormal liver enzymes   . Abnormal LFTs   . Biliary sepsis   . Acute respiratory failure ([place])   . Severe sepsis ([place])   . Bacteremia due to Gram-negative bacteria ([place])   . Hiatal hernia   . Sepsis ([place]) 03/28/2015  . Incisional hernia, without obstruction or gangrene 08/17/2014  . Atrial flutter ([place]) 07/28/2014  . Palpitation 06/16/2014  . Pain in the chest 06/10/2014  . Right knee DJD 03/11/2014  . S/P CABG x 4 09/03/2013  . Insomnia 07/27/2013  . Testosterone deficiency 07/27/2013  . Osteoarthritis of right knee 07/27/2013  .  Hyperlipidemia 06/10/2013  . Heart murmur 06/10/2013  . Dyspnea 06/10/2013  . Inguinal hernia, left. 04/04/2012  . Paraesophageal hiatal hernia 03/24/2012  . Prostate cancer ([place]) 01/15/2012  . Special screening for malignant neoplasms, colon 01/15/2012   Past Medical History  Diagnosis Date  . Prostate cancer ([place])   . GERD (gastroesophageal reflux disease)     Large HH with intrathoracic component.   . Hypertension   . Hyperlipemia   . Arthritis   . Inguinal hernia     left  . Coronary artery disease   . Dysrhythmia   . Pneumonia   . H/O hiatal hernia   . CKD (chronic kidney disease) 08/2013    stage 3  . Choledocholithiasis   . Hypothyroidism   . Atrial fibrillation and flutter [facility])     Past Surgical History  Procedure Laterality Date  . Hernia repair    . Prostate surgery    . Colonoscopy  2003, 01/2012    non-bleeding cecal AVMs, descending and sigmoid tics.   . Coronary artery bypass graft N/A 09/03/2013    Procedure: CORONARY ARTERY BYPASS GRAFTING times four using left internal mammary and bilateral saphenous vein.;  Surgeon: Ivin Poot, MD;  Location: [place];  Service: Open Heart Surgery;  Laterality: N/A;  . Intraoperative transesophageal echocardiogram N/A 09/03/2013    Procedure: INTRAOPERATIVE TRANSESOPHAGEAL ECHOCARDIOGRAM;  Surgeon: Ivin Poot, MD;  Location: [place];  Service: Open Heart Surgery;  Laterality: N/A;  . Coronary angioplasty    . Cataract extraction Bilateral   . Total knee arthroplasty Right 03/11/2014    Procedure: TOTAL  KNEE ARTHROPLASTY;  Surgeon: Hessie Dibble, MD;  Location: [place];  Service: Orthopedics;  Laterality: Right;  . Left heart catheterization with coronary angiogram N/A 08/03/2013    Procedure: LEFT HEART CATHETERIZATION WITH CORONARY ANGIOGRAM;  Surgeon: Blane Ohara, MD;  Location: [facility] CATH LAB;  Service: Cardiovascular;  Laterality: N/A;  . Right thoracotomy  As a young child, ? 56 years old    for pneumonia   .  Esophagogastroduodenoscopy  01/2012    11 cm sliding hiatal hernia.  . Ercp N/A 04/04/2015    Procedure: ENDOSCOPIC RETROGRADE CHOLANGIOPANCREATOGRAPHY (ERCP);  Surgeon: Inda Castle, MD;  Location: [place];  Service: Endoscopy;  Laterality: N/A;  . Ercp N/A 04/06/2015    Procedure: ENDOSCOPIC RETROGRADE CHOLANGIOPANCREATOGRAPHY (ERCP);  Surgeon: Inda Castle, MD;  Location: [facility];  Service: Endoscopy;  Laterality: N/A;  . Eye surgery      cataracts removed, ? IOL    No prescriptions prior to admission   Allergies  Allergen Reactions  . Lipitor [Atorvastatin] Other (See Comments)    "feel bad"  . Penicillins Itching    Has patient had a PCN reaction causing immediate rash, facial/tongue/throat swelling, SOB or lightheadedness with hypotension: Yes Has patient had a PCN reaction causing severe rash involving mucus membranes or skin necrosis: No Has patient had a PCN reaction that required hospitalization No Has patient had a PCN reaction occurring within the last 10 years: No If all of the above answers are "NO", then may proceed with Cephalosporin use.     Social History  Substance Use Topics  . Smoking status: Former Smoker -- 1.00 packs/day    Types: Cigarettes    Quit date: 08/20/1978  . Smokeless tobacco: Never Used  . Alcohol Use: No    Family History  Problem Relation Age of Onset  . Colon cancer Neg Hx   . Cancer Mother     bone marrow     Review of Systems  Musculoskeletal: Positive for joint pain.       Left knee  All other systems reviewed and are negative.   Objective:  Physical Exam  Constitutional: He is oriented to person, place, and time. He appears well-developed and well-nourished.  HENT:  Head: Normocephalic and atraumatic.  Eyes: Pupils are equal, round, and reactive to light.  Neck: Normal range of motion.  Cardiovascular: Normal rate and regular rhythm.   Respiratory: Effort normal.  GI: Soft.  Musculoskeletal:  Left knee  motion is from short of full extension to about 110 of flexion. His medial joint line pain and a moderate varus deformity. On the opposite knee has better motion is 0-125. I do not feel an effusion. He does have some instability in full extension to varus and valgus stress. Hip motion is full on both sides. Sensation and motor function are intact in his feet . His palpable pulses at both ankles.   Neurological: He is alert and oriented to person, place, and time.  Skin: Skin is warm and dry.  Psychiatric: He has a normal mood and affect. His behavior is normal. Judgment and thought content normal.    Vital signs in last 24 hours:    Labs:   Estimated body mass index is 21.49 kg/(m^2) as calculated from the following:   Height as of 05/25/15: 5\' 11"  (1.803 m).   Weight as of 05/25/15: 69.854 kg (154 lb).   Imaging Review Plain radiographs demonstrate severe degenerative joint disease of the left knee(s). The overall  alignment isneutral. The bone quality appears to be good for age and reported activity level.  Assessment/Plan:  End stage primary arthritis, left knee   The patient history, physical examination, clinical judgment of the provider and imaging studies are consistent with end stage degenerative joint disease of the left knee(s) and total knee arthroplasty is deemed medically necessary. The treatment options including medical management, injection therapy arthroscopy and arthroplasty were discussed at length. The risks and benefits of total knee arthroplasty were presented and reviewed. The risks due to aseptic loosening, infection, stiffness, patella tracking problems, thromboembolic complications and other imponderables were discussed. The patient acknowledged the explanation, agreed to proceed with the plan and consent was signed. Patient is being admitted for inpatient treatment for surgery, pain control, PT, OT, prophylactic antibiotics, VTE prophylaxis, progressive ambulation  and ADL's and discharge planning. The patient is planning to be discharged to skilled nursing facility

## 2015-09-20 ENCOUNTER — Inpatient Hospital Stay (HOSPITAL_COMMUNITY)
Admission: RE | Admit: 2015-09-20 | Discharge: 2015-09-23 | DRG: 470 | Disposition: A | Discharge status: Skilled Nursing Facility | Payer: Medicare PPO | Source: Ambulatory Visit | Attending: Orthopaedic Surgery | Admitting: Orthopaedic Surgery

## 2015-09-20 ENCOUNTER — Encounter (HOSPITAL_COMMUNITY): Payer: Self-pay

## 2015-09-20 ENCOUNTER — Inpatient Hospital Stay (HOSPITAL_COMMUNITY): Payer: Medicare PPO | Admitting: Certified Registered Nurse Anesthetist

## 2015-09-20 ENCOUNTER — Encounter (HOSPITAL_COMMUNITY)
Admission: RE | Disposition: A | Discharge status: Skilled Nursing Facility | Payer: Self-pay | Source: Ambulatory Visit | Attending: Orthopaedic Surgery

## 2015-09-20 ENCOUNTER — Inpatient Hospital Stay (HOSPITAL_COMMUNITY): Payer: Medicare PPO | Admitting: Emergency Medicine

## 2015-09-20 DIAGNOSIS — Z96651 Presence of right artificial knee joint: Secondary | ICD-10-CM | POA: Diagnosis present

## 2015-09-20 DIAGNOSIS — Z951 Presence of aortocoronary bypass graft: Secondary | ICD-10-CM | POA: Diagnosis not present

## 2015-09-20 DIAGNOSIS — I2510 Atherosclerotic heart disease of native coronary artery without angina pectoris: Secondary | ICD-10-CM | POA: Diagnosis present

## 2015-09-20 DIAGNOSIS — Z87891 Personal history of nicotine dependence: Secondary | ICD-10-CM | POA: Diagnosis not present

## 2015-09-20 DIAGNOSIS — Z8546 Personal history of malignant neoplasm of prostate: Secondary | ICD-10-CM

## 2015-09-20 DIAGNOSIS — E785 Hyperlipidemia, unspecified: Secondary | ICD-10-CM | POA: Diagnosis present

## 2015-09-20 DIAGNOSIS — M1712 Unilateral primary osteoarthritis, left knee: Principal | ICD-10-CM | POA: Diagnosis present

## 2015-09-20 DIAGNOSIS — K219 Gastro-esophageal reflux disease without esophagitis: Secondary | ICD-10-CM | POA: Diagnosis present

## 2015-09-20 DIAGNOSIS — Z7901 Long term (current) use of anticoagulants: Secondary | ICD-10-CM | POA: Diagnosis not present

## 2015-09-20 DIAGNOSIS — Z880 Allergy status to penicillin: Secondary | ICD-10-CM | POA: Diagnosis not present

## 2015-09-20 DIAGNOSIS — N183 Chronic kidney disease, stage 3 (moderate): Secondary | ICD-10-CM | POA: Diagnosis present

## 2015-09-20 DIAGNOSIS — D631 Anemia in chronic kidney disease: Secondary | ICD-10-CM | POA: Diagnosis present

## 2015-09-20 DIAGNOSIS — I129 Hypertensive chronic kidney disease with stage 1 through stage 4 chronic kidney disease, or unspecified chronic kidney disease: Secondary | ICD-10-CM | POA: Diagnosis present

## 2015-09-20 DIAGNOSIS — E039 Hypothyroidism, unspecified: Secondary | ICD-10-CM | POA: Diagnosis present

## 2015-09-20 DIAGNOSIS — M25562 Pain in left knee: Secondary | ICD-10-CM | POA: Diagnosis present

## 2015-09-20 DIAGNOSIS — M1710 Unilateral primary osteoarthritis, unspecified knee: Secondary | ICD-10-CM | POA: Diagnosis present

## 2015-09-20 DIAGNOSIS — Z791 Long term (current) use of non-steroidal anti-inflammatories (NSAID): Secondary | ICD-10-CM

## 2015-09-20 HISTORY — PX: TOTAL KNEE ARTHROPLASTY: SHX125

## 2015-09-20 SURGERY — ARTHROPLASTY, KNEE, TOTAL
Anesthesia: Spinal | Site: Knee | Laterality: Left

## 2015-09-20 MED ORDER — ONDANSETRON HCL 4 MG/2ML IJ SOLN
INTRAMUSCULAR | Status: DC | PRN
  Administered 2015-09-20: 4 mg via INTRAVENOUS

## 2015-09-20 MED ORDER — ACETAMINOPHEN 325 MG PO TABS: 650.0000 mg | ORAL_TABLET | Freq: Four times a day (QID) | ORAL | Status: DC | PRN

## 2015-09-20 MED ORDER — METOCLOPRAMIDE HCL 5 MG/ML IJ SOLN: 5.0000 mg | Freq: Three times a day (TID) | INTRAMUSCULAR | Status: DC | PRN

## 2015-09-20 MED ORDER — DILTIAZEM HCL ER COATED BEADS 240 MG PO CP24
240.0000 mg | ORAL_CAPSULE | Freq: Every day | ORAL | Status: DC
  Administered 2015-09-21 – 2015-09-23 (×3): 240 mg via ORAL
  Filled 2015-09-20 (×3): qty 1

## 2015-09-20 MED ORDER — PROPOFOL 500 MG/50ML IV EMUL
INTRAVENOUS | Status: DC | PRN
  Administered 2015-09-20: 50 ug/kg/min via INTRAVENOUS

## 2015-09-20 MED ORDER — SODIUM CHLORIDE 0.9 % IV SOLN
INTRAVENOUS | Status: DC
  Administered 2015-09-20 (×3): via INTRAVENOUS

## 2015-09-20 MED ORDER — PHENYLEPHRINE HCL 10 MG/ML IJ SOLN
INTRAMUSCULAR | Status: DC | PRN
  Administered 2015-09-20 (×3): 80 ug via INTRAVENOUS

## 2015-09-20 MED ORDER — TRANEXAMIC ACID 1000 MG/10ML IV SOLN
1000.0000 mg | INTRAVENOUS | Status: AC
  Administered 2015-09-20: 1000 mg via INTRAVENOUS
  Filled 2015-09-20: qty 10

## 2015-09-20 MED ORDER — HYDROMORPHONE HCL 1 MG/ML IJ SOLN
0.5000 mg | INTRAMUSCULAR | Status: DC | PRN
  Administered 2015-09-20: 1 mg via INTRAVENOUS
  Filled 2015-09-20: qty 1

## 2015-09-20 MED ORDER — METHOCARBAMOL 500 MG PO TABS
500.0000 mg | ORAL_TABLET | Freq: Four times a day (QID) | ORAL | Status: DC | PRN
  Administered 2015-09-21: 500 mg via ORAL
  Filled 2015-09-20 (×2): qty 1

## 2015-09-20 MED ORDER — PRAVASTATIN SODIUM 40 MG PO TABS
40.0000 mg | ORAL_TABLET | Freq: Every day | ORAL | Status: DC
  Administered 2015-09-21 – 2015-09-22 (×2): 40 mg via ORAL
  Filled 2015-09-20 (×2): qty 1

## 2015-09-20 MED ORDER — SODIUM CHLORIDE 0.9 % IR SOLN
Status: DC | PRN
  Administered 2015-09-20: 3000 mL

## 2015-09-20 MED ORDER — PANTOPRAZOLE SODIUM 40 MG PO TBEC
80.0000 mg | DELAYED_RELEASE_TABLET | Freq: Every day | ORAL | Status: DC
  Administered 2015-09-21 – 2015-09-23 (×3): 80 mg via ORAL
  Filled 2015-09-20 (×3): qty 2

## 2015-09-20 MED ORDER — FENTANYL CITRATE (PF) 100 MCG/2ML IJ SOLN
INTRAMUSCULAR | Status: AC
  Filled 2015-09-20: qty 2

## 2015-09-20 MED ORDER — LIDOCAINE HCL (CARDIAC) 20 MG/ML IV SOLN
INTRAVENOUS | Status: DC | PRN
  Administered 2015-09-20: 20 mg via INTRATRACHEAL

## 2015-09-20 MED ORDER — BUPIVACAINE-EPINEPHRINE (PF) 0.5% -1:200000 IJ SOLN
INTRAMUSCULAR | Status: AC
  Filled 2015-09-20: qty 30

## 2015-09-20 MED ORDER — BUPIVACAINE LIPOSOME 1.3 % IJ SUSP
20.0000 mL | INTRAMUSCULAR | Status: DC
  Filled 2015-09-20 (×2): qty 20

## 2015-09-20 MED ORDER — BUPIVACAINE IN DEXTROSE 0.75-8.25 % IT SOLN
INTRATHECAL | Status: DC | PRN
  Administered 2015-09-20: 1.8 mL via INTRATHECAL

## 2015-09-20 MED ORDER — ONDANSETRON HCL 4 MG PO TABS: 4.0000 mg | ORAL_TABLET | Freq: Four times a day (QID) | ORAL | Status: DC | PRN

## 2015-09-20 MED ORDER — APIXABAN 2.5 MG PO TABS
2.5000 mg | ORAL_TABLET | Freq: Two times a day (BID) | ORAL | Status: DC
  Administered 2015-09-21 – 2015-09-23 (×5): 2.5 mg via ORAL
  Filled 2015-09-20 (×5): qty 1

## 2015-09-20 MED ORDER — OXYBUTYNIN CHLORIDE 5 MG PO TABS
5.0000 mg | ORAL_TABLET | Freq: Every day | ORAL | Status: DC
  Administered 2015-09-21 – 2015-09-23 (×3): 5 mg via ORAL
  Filled 2015-09-20 (×3): qty 1

## 2015-09-20 MED ORDER — EPHEDRINE SULFATE 50 MG/ML IJ SOLN
INTRAMUSCULAR | Status: DC | PRN
  Administered 2015-09-20: 5 mg via INTRAVENOUS
  Administered 2015-09-20 (×3): 10 mg via INTRAVENOUS

## 2015-09-20 MED ORDER — ACETAMINOPHEN 650 MG RE SUPP: 650.0000 mg | Freq: Four times a day (QID) | RECTAL | Status: DC | PRN

## 2015-09-20 MED ORDER — METOCLOPRAMIDE HCL 5 MG PO TABS: 5.0000 mg | ORAL_TABLET | Freq: Three times a day (TID) | ORAL | Status: DC | PRN

## 2015-09-20 MED ORDER — FUROSEMIDE 40 MG PO TABS
40.0000 mg | ORAL_TABLET | Freq: Every day | ORAL | Status: DC
  Administered 2015-09-21 – 2015-09-23 (×3): 40 mg via ORAL
  Filled 2015-09-20 (×3): qty 1

## 2015-09-20 MED ORDER — MIDAZOLAM HCL 2 MG/2ML IJ SOLN
INTRAMUSCULAR | Status: AC
  Filled 2015-09-20: qty 2

## 2015-09-20 MED ORDER — ONDANSETRON HCL 4 MG/2ML IJ SOLN: 4.0000 mg | Freq: Four times a day (QID) | INTRAMUSCULAR | Status: DC | PRN

## 2015-09-20 MED ORDER — HYDROMORPHONE HCL 1 MG/ML IJ SOLN: 0.2500 mg | INTRAMUSCULAR | Status: DC | PRN

## 2015-09-20 MED ORDER — FENTANYL CITRATE (PF) 250 MCG/5ML IJ SOLN
INTRAMUSCULAR | Status: AC
  Filled 2015-09-20: qty 5

## 2015-09-20 MED ORDER — VANCOMYCIN HCL IN DEXTROSE 1-5 GM/200ML-% IV SOLN
1000.0000 mg | Freq: Two times a day (BID) | INTRAVENOUS | Status: AC
  Administered 2015-09-20: 1000 mg via INTRAVENOUS
  Filled 2015-09-20: qty 200

## 2015-09-20 MED ORDER — LACTATED RINGERS IV SOLN
INTRAVENOUS | Status: DC
  Administered 2015-09-20: 17:00:00 via INTRAVENOUS

## 2015-09-20 MED ORDER — 0.9 % SODIUM CHLORIDE (POUR BTL) OPTIME
TOPICAL | Status: DC | PRN
  Administered 2015-09-20: 1000 mL

## 2015-09-20 MED ORDER — BUPIVACAINE LIPOSOME 1.3 % IJ SUSP
INTRAMUSCULAR | Status: DC | PRN
  Administered 2015-09-20: 20 mL

## 2015-09-20 MED ORDER — DOCUSATE SODIUM 100 MG PO CAPS
100.0000 mg | ORAL_CAPSULE | Freq: Two times a day (BID) | ORAL | Status: DC
  Administered 2015-09-20 – 2015-09-23 (×6): 100 mg via ORAL
  Filled 2015-09-20 (×6): qty 1

## 2015-09-20 MED ORDER — METHOCARBAMOL 1000 MG/10ML IJ SOLN
500.0000 mg | Freq: Four times a day (QID) | INTRAMUSCULAR | Status: DC | PRN
  Filled 2015-09-20: qty 5

## 2015-09-20 MED ORDER — LEVOTHYROXINE SODIUM 25 MCG PO TABS
25.0000 ug | ORAL_TABLET | Freq: Every day | ORAL | Status: DC
Start: 2015-09-21 — End: 2015-09-23
  Administered 2015-09-21 – 2015-09-23 (×3): 25 ug via ORAL
  Filled 2015-09-20 (×3): qty 1

## 2015-09-20 MED ORDER — MENTHOL 3 MG MT LOZG: 1.0000 | LOZENGE | OROMUCOSAL | Status: DC | PRN

## 2015-09-20 MED ORDER — BISACODYL 5 MG PO TBEC
5.0000 mg | DELAYED_RELEASE_TABLET | Freq: Every day | ORAL | Status: DC | PRN
  Administered 2015-09-21 – 2015-09-22 (×2): 5 mg via ORAL
  Filled 2015-09-20 (×2): qty 1

## 2015-09-20 MED ORDER — PHENOL 1.4 % MT LIQD
1.0000 | OROMUCOSAL | Status: DC | PRN
Start: 2015-09-20 — End: 2015-09-23

## 2015-09-20 MED ORDER — FENTANYL CITRATE (PF) 100 MCG/2ML IJ SOLN
INTRAMUSCULAR | Status: DC | PRN
  Administered 2015-09-20: 50 ug via INTRAVENOUS

## 2015-09-20 MED ORDER — ALUM & MAG HYDROXIDE-SIMETH 200-200-20 MG/5ML PO SUSP
30.0000 mL | ORAL | Status: DC | PRN
  Administered 2015-09-21: 30 mL via ORAL
  Filled 2015-09-20: qty 30

## 2015-09-20 MED ORDER — PRAMIPEXOLE DIHYDROCHLORIDE 0.125 MG PO TABS
1.0000 mg | ORAL_TABLET | ORAL | Status: DC
  Administered 2015-09-20 – 2015-09-22 (×3): 1 mg via ORAL
  Filled 2015-09-20 (×3): qty 8

## 2015-09-20 MED ORDER — PROPOFOL 10 MG/ML IV BOLUS
INTRAVENOUS | Status: AC
  Filled 2015-09-20: qty 20

## 2015-09-20 MED ORDER — HYDROCODONE-ACETAMINOPHEN 5-325 MG PO TABS
1.0000 | ORAL_TABLET | ORAL | Status: DC | PRN
  Administered 2015-09-20 – 2015-09-22 (×5): 2 via ORAL
  Administered 2015-09-22: 1 via ORAL
  Administered 2015-09-22: 2 via ORAL
  Administered 2015-09-22: 1 via ORAL
  Administered 2015-09-22 – 2015-09-23 (×2): 2 via ORAL
  Filled 2015-09-20 (×2): qty 2
  Filled 2015-09-20: qty 1
  Filled 2015-09-20 (×5): qty 2
  Filled 2015-09-20: qty 1
  Filled 2015-09-20: qty 2
  Filled 2015-09-20: qty 1

## 2015-09-20 MED ORDER — PROMETHAZINE HCL 25 MG/ML IJ SOLN: 6.2500 mg | INTRAMUSCULAR | Status: DC | PRN

## 2015-09-20 MED ORDER — DIPHENHYDRAMINE HCL 12.5 MG/5ML PO ELIX
12.5000 mg | ORAL_SOLUTION | ORAL | Status: DC | PRN
  Administered 2015-09-22: 25 mg via ORAL
  Filled 2015-09-20: qty 10

## 2015-09-20 MED ORDER — BUPIVACAINE LIPOSOME 1.3 % IJ SUSP
INTRAMUSCULAR | Status: DC | PRN
  Administered 2015-09-20: 40 mL

## 2015-09-20 SURGICAL SUPPLY — 60 items
BANDAGE ESMARK 6X9 LF (GAUZE/BANDAGES/DRESSINGS) ×1 IMPLANT
BLADE SAGITTAL 25.0X1.19X90 (BLADE) ×2 IMPLANT
BLADE SAGITTAL 25.0X1.19X90MM (BLADE) ×1
BNDG ELASTIC 6X10 VLCR STRL LF (GAUZE/BANDAGES/DRESSINGS) ×3 IMPLANT
BNDG ESMARK 6X9 LF (GAUZE/BANDAGES/DRESSINGS) ×3
BNDG GAUZE ELAST 4 BULKY (GAUZE/BANDAGES/DRESSINGS) ×6 IMPLANT
BOWL SMART MIX CTS (DISPOSABLE) ×3 IMPLANT
CAP KNEE TOTAL 3 SIGMA ×3 IMPLANT
CEMENT HV SMART SET (Cement) ×6 IMPLANT
CLOSURE WOUND 1/2 X4 (GAUZE/BANDAGES/DRESSINGS) ×1
COVER SURGICAL LIGHT HANDLE (MISCELLANEOUS) ×3 IMPLANT
CUFF TOURNIQUET SINGLE 34IN LL (TOURNIQUET CUFF) ×3 IMPLANT
DECANTER SPIKE VIAL GLASS SM (MISCELLANEOUS) ×3 IMPLANT
DRAPE EXTREMITY T 121X128X90 (DRAPE) ×3 IMPLANT
DRAPE PROXIMA HALF (DRAPES) ×3 IMPLANT
DRAPE U-SHAPE 47X51 STRL (DRAPES) ×3 IMPLANT
DRSG ADAPTIC 3X8 NADH LF (GAUZE/BANDAGES/DRESSINGS) ×3 IMPLANT
DRSG PAD ABDOMINAL 8X10 ST (GAUZE/BANDAGES/DRESSINGS) ×6 IMPLANT
DURAPREP 26ML APPLICATOR (WOUND CARE) ×3 IMPLANT
ELECT REM PT RETURN 9FT ADLT (ELECTROSURGICAL) ×3
ELECTRODE REM PT RTRN 9FT ADLT (ELECTROSURGICAL) ×1 IMPLANT
GAUZE SPONGE 4X4 12PLY STRL (GAUZE/BANDAGES/DRESSINGS) ×3 IMPLANT
GLOVE BIO SURGEON STRL SZ8 (GLOVE) ×6 IMPLANT
GLOVE BIOGEL PI IND STRL 6 (GLOVE) ×1 IMPLANT
GLOVE BIOGEL PI IND STRL 7.5 (GLOVE) ×1 IMPLANT
GLOVE BIOGEL PI IND STRL 8 (GLOVE) ×3 IMPLANT
GLOVE BIOGEL PI INDICATOR 6 (GLOVE) ×2
GLOVE BIOGEL PI INDICATOR 7.5 (GLOVE) ×2
GLOVE BIOGEL PI INDICATOR 8 (GLOVE) ×6
GLOVE SURG SS PI 6.0 STRL IVOR (GLOVE) ×3 IMPLANT
GLOVE SURG SS PI 6.5 STRL IVOR (GLOVE) ×3 IMPLANT
GLOVE SURG SS PI 7.0 STRL IVOR (GLOVE) ×3 IMPLANT
GOWN STRL REUS W/ TWL LRG LVL3 (GOWN DISPOSABLE) ×3 IMPLANT
GOWN STRL REUS W/ TWL XL LVL3 (GOWN DISPOSABLE) ×2 IMPLANT
GOWN STRL REUS W/TWL LRG LVL3 (GOWN DISPOSABLE) ×6
GOWN STRL REUS W/TWL XL LVL3 (GOWN DISPOSABLE) ×4
HANDPIECE INTERPULSE COAX TIP (DISPOSABLE) ×2
HOOD PEEL AWAY FACE SHEILD DIS (HOOD) ×9 IMPLANT
IMMOBILIZER KNEE 22 UNIV (SOFTGOODS) ×3 IMPLANT
KIT BASIN OR (CUSTOM PROCEDURE TRAY) ×3 IMPLANT
KIT ROOM TURNOVER OR (KITS) ×3 IMPLANT
MANIFOLD NEPTUNE II (INSTRUMENTS) ×3 IMPLANT
NEEDLE 22X1 1/2 (OR ONLY) (NEEDLE) ×3 IMPLANT
NS IRRIG 1000ML POUR BTL (IV SOLUTION) ×3 IMPLANT
PACK TOTAL JOINT (CUSTOM PROCEDURE TRAY) ×3 IMPLANT
PAD ARMBOARD 7.5X6 YLW CONV (MISCELLANEOUS) ×6 IMPLANT
SET HNDPC FAN SPRY TIP SCT (DISPOSABLE) ×1 IMPLANT
STAPLER VISISTAT 35W (STAPLE) ×3 IMPLANT
STRIP CLOSURE SKIN 1/2X4 (GAUZE/BANDAGES/DRESSINGS) ×2 IMPLANT
SUT MNCRL AB 3-0 PS2 18 (SUTURE) ×3 IMPLANT
SUT VIC AB 0 CT1 27 (SUTURE) ×4
SUT VIC AB 0 CT1 27XBRD ANBCTR (SUTURE) ×2 IMPLANT
SUT VIC AB 2-0 CT1 27 (SUTURE) ×4
SUT VIC AB 2-0 CT1 TAPERPNT 27 (SUTURE) ×2 IMPLANT
SUT VLOC 180 0 24IN GS25 (SUTURE) ×3 IMPLANT
SYR 50ML LL SCALE MARK (SYRINGE) ×3 IMPLANT
TOWEL OR 17X24 6PK STRL BLUE (TOWEL DISPOSABLE) ×3 IMPLANT
TOWEL OR 17X26 10 PK STRL BLUE (TOWEL DISPOSABLE) ×3 IMPLANT
TRAY FOLEY CATH 14FR (SET/KITS/TRAYS/PACK) IMPLANT
WATER STERILE IRR 1000ML POUR (IV SOLUTION) ×6 IMPLANT

## 2015-09-20 NOTE — Anesthesia Procedure Notes (Signed)
Spinal Patient location during procedure: OR Staffing Performed by: anesthesiologist  Preanesthetic Checklist Completed: patient identified, site marked, surgical consent, pre-op evaluation, timeout performed, IV checked, risks and benefits discussed and monitors and equipment checked Spinal Block Patient position: sitting Prep: Betadine Patient monitoring: heart rate, continuous pulse ox and blood pressure Injection technique: single-shot Needle Needle type: Sprotte  Needle gauge: 22 G Needle length: 9 cm Additional Notes Expiration date of kit checked and confirmed. Patient tolerated procedure well, without complications.     

## 2015-09-20 NOTE — Progress Notes (Signed)
Orthopedic Tech Progress Note Patient Details:  [name] 27-Feb-1927 [id number]  CPM Left Knee CPM Left Knee: On Left Knee Flexion (Degrees): 90 Left Knee Extension (Degrees): 0 Additional Comments: trapeze bar patient helper Viewed order from doctor's order list  [name] 09/20/2015, 2:49 PM

## 2015-09-20 NOTE — Transfer of Care (Signed)
Immediate Anesthesia Transfer of Care Note  Patient: [name]  Procedure(s) Performed: Procedure(s): TOTAL KNEE ARTHROPLASTY (Left)  Patient Location: PACU  Anesthesia Type:Spinal  Level of Consciousness: awake, alert  and oriented  Airway & Oxygen Therapy: Patient Spontanous Breathing and Patient connected to nasal cannula oxygen  Post-op Assessment: Report given to RN and Post -op Vital signs reviewed and stable  Post vital signs: Reviewed and stable  Last Vitals:  Filed Vitals:   09/20/15 0857 09/20/15 1408  BP: 149/76   Pulse: 78   Temp: 36.3 C 36.4 C  Resp: 18     Complications: No apparent anesthesia complications

## 2015-09-20 NOTE — Op Note (Signed)
PREOP DIAGNOSIS: DJD LEFT KNEE POSTOP DIAGNOSIS:  same PROCEDURE: LEFT TKR ANESTHESIA: Spinal and MAC ATTENDING SURGEON: Liahna Brickner G ASSISTANTLoni Dolly PA  INDICATIONS FOR PROCEDURE: [name] is a 82 y.o. male who has struggled for a long time with pain due to degenerative arthritis of the left knee.  The patient has failed many conservative non-operative measures and at this point has pain which limits the ability to sleep and walk.  The patient is offered total knee replacement.  Informed operative consent was obtained after discussion of possible risks of anesthesia, infection, neurovascular injury, DVT, and death.  The importance of the post-operative rehabilitation protocol to optimize result was stressed extensively with the patient.  SUMMARY OF FINDINGS AND PROCEDURE:  [name] was taken to the operative suite where under the above anesthesia a left knee replacement was performed.  There were advanced degenerative changes and the bone quality was excellent.  We used the DePuyLCS system and placed size large femur, 5 tibia, 38 mm all polyethylene patella, and a size 12.5 mm spacer.  Loni Dolly PA-C assisted throughout and was invaluable to the completion of the case in that he helped retract and maintain exposure while I placed the components.  He also helped close thereby minimizing OR time.  The patient was admitted for appropriate post-op care to include perioperative antibiotics and mechanical and pharmacologic measures for DVT prophylaxis.  DESCRIPTION OF PROCEDURE:  [name] was taken to the operative suite where the above anesthesia was applied.  The patient was positioned supine and prepped and draped in normal sterile fashion.  An appropriate time out was performed.  After the administration of kefzol pre-op antibiotic the leg was elevated and exsanguinated and a tourniquet inflated.  A standard longitudinal incision was made on the anterior knee.  Dissection was carried  down to the extensor mechanism.  All appropriate anti-infective measures were used including the pre-operative antibiotic, betadine impregnated drape, and closed hooded exhaust systems for each member of the surgical team.  A medial parapatellar incision was made in the extensor mechanism and the knee cap flipped and the knee flexed.  Some residual meniscal tissues were removed along with any remaining ACL/PCL tissue.  A guide was placed on the tibia and a flat cut was made on it's superior surface.  An intramedullary guide was placed in the femur and was utilized to make anterior and posterior cuts creating an appropriate flexion gap.  A second intramedullary guide was placed in the femur to make a distal cut properly balancing the knee with an extension gap equal to the flexion gap.  The three bones sized to the above mentioned sizes and the appropriate guides were placed and utilized.  A trial reduction was done and the knee easily came to full extension and the patella tracked well on flexion.  The trial components were removed and all bones were cleaned with pulsatile lavage and then dried thoroughly.  Cement was mixed and was pressurized onto the bones followed by placement of the aforementioned components.  Excess cement was trimmed and pressure was held on the components until the cement had hardened.  The tourniquet was deflated and a small amount of bleeding was controlled with cautery and pressure.  The knee was irrigated thoroughly.  The extensor mechanism was re-approximated with V-loc suture in running fashion.  The knee was flexed and the repair was solid.  The subcutaneous tissues were re-approximated with #0 and #2-0 vicryl and the skin closed  with a subcuticular stitch and steristrips.  A sterile dressing was applied.  Intraoperative fluids, EBL, and tourniquet time can be obtained from anesthesia records.  DISPOSITION:  The patient was taken to recovery room in stable condition and admitted for  appropriate post-op care to include peri-operative antibiotic and DVT prophylaxis with mechanical and pharmacologic measures.  Ilene Witcher G 09/20/2015, 1:36 PM

## 2015-09-20 NOTE — Anesthesia Preprocedure Evaluation (Signed)
Anesthesia Evaluation  Patient identified by MRN, date of birth, ID band Patient awake    Reviewed: Allergy & Precautions, NPO status , Patient's Chart, lab work & pertinent test results  Airway Mallampati: II  TM Distance: >3 FB Neck ROM: Full    Dental no notable dental hx.    Pulmonary neg pulmonary ROS, former smoker,    Pulmonary exam normal breath sounds clear to auscultation       Cardiovascular hypertension, Pt. on medications Normal cardiovascular exam Rhythm:Regular Rate:Normal     Neuro/Psych negative neurological ROS  negative psych ROS   GI/Hepatic Neg liver ROS, hiatal hernia,   Endo/Other  Hypothyroidism   Renal/GU Renal InsufficiencyRenal disease  negative genitourinary   Musculoskeletal negative musculoskeletal ROS (+)   Abdominal   Peds negative pediatric ROS (+)  Hematology negative hematology ROS (+)   Anesthesia Other Findings   Reproductive/Obstetrics negative OB ROS                             Anesthesia Physical Anesthesia Plan  ASA: III  Anesthesia Plan: Spinal   Post-op Pain Management:    Induction:   Airway Management Planned: Simple Face Mask  Additional Equipment:   Intra-op Plan:   Post-operative Plan: Extubation in OR  Informed Consent: I have reviewed the patients History and Physical, chart, labs and discussed the procedure including the risks, benefits and alternatives for the proposed anesthesia with the patient or authorized representative who has indicated his/her understanding and acceptance.   Dental advisory given  Plan Discussed with: CRNA  Anesthesia Plan Comments: (Check last eliquis dose)        Anesthesia Quick Evaluation

## 2015-09-20 NOTE — Progress Notes (Signed)
Utilization review completed.  

## 2015-09-20 NOTE — Interval H&P Note (Signed)
[facility] for surgery PD

## 2015-09-20 NOTE — Evaluation (Signed)
Physical Therapy Evaluation Patient Details Name: [name] MRN: [id number] DOB: March 26, 1927 Today's Date: 09/20/2015   History of Present Illness  Pt is a 82 y/o F s/p Lt TKA.  Pt's PMH includes anemia, acute respiratory failure, sepsis, chest pain, Rt TKA, atrial flutter.   Clinical Impression  Pt is s/p Lt TKA resulting in the deficits listed below (see PT Problem List). Mr. Hornaday will have 24/7 assist available from his friend/neighbor at d/c.  He currently requires supervision w/ transfers and ambulation. Pt will benefit from skilled PT to increase their independence and safety with mobility to allow discharge to the venue listed below.     Follow Up Recommendations Home health PT;Supervision for mobility/OOB    Equipment Recommendations  3in1 (PT)    Recommendations for Other Services OT consult     Precautions / Restrictions Precautions Precautions: Fall;Knee Precaution Booklet Issued: Yes (comment) Precaution Comments: Reviewed knee precautions w/ pt Required Braces or Orthoses: Knee Immobilizer - Left Knee Immobilizer - Left: Other (comment) (not specified; no need for KI this session) Restrictions Weight Bearing Restrictions: Yes LLE Weight Bearing: Weight bearing as tolerated      Mobility  Bed Mobility Overal bed mobility: Modified Independent             General bed mobility comments: Use of bed rail.  No cues or physical assist needed.  Transfers Overall transfer level: Needs assistance Equipment used: Rolling walker (2 wheeled) Transfers: Sit to/from Stand Sit to Stand: Supervision         General transfer comment: Cues for hand placement using RW.  No physical assist needed.  Ambulation/Gait Ambulation/Gait assistance: Supervision Ambulation Distance (Feet): 100 Feet Assistive device: Rolling walker (2 wheeled) Gait Pattern/deviations: Step-through pattern;Decreased stride length;Decreased weight shift to left;Trunk flexed   Gait velocity  interpretation: Below normal speed for age/gender General Gait Details: Slightly flexed trunk, no knee buckle noted.  Cues for proper management of RW.  Stairs            Wheelchair Mobility    Modified Rankin (Stroke Patients Only)       Balance Overall balance assessment: Needs assistance Sitting-balance support: No upper extremity supported;Feet supported Sitting balance-Leahy Scale: Good     Standing balance support: During functional activity;No upper extremity supported Standing balance-Leahy Scale: Fair Standing balance comment: Pt able to stand w/o UE support to adjust gown                             Pertinent Vitals/Pain Pain Assessment: 0-10 Pain Score: 2  Pain Location: Lt knee Pain Descriptors / Indicators: Aching;Discomfort Pain Intervention(s): Limited activity within patient's tolerance;Monitored during session;Repositioned    Home Living Family/patient expects to be discharged to:: Private residence Living Arrangements: Alone Available Help at Discharge: Friend(s);Available 24 hours/day (neighbor) Type of Home: House Home Access: Level entry     Home Layout: One level Home Equipment: Walker - 2 wheels;Cane - single point      Prior Function Level of Independence: Independent               Hand Dominance   Dominant Hand: Right    Extremity/Trunk Assessment   Upper Extremity Assessment: Overall WFL for tasks assessed           Lower Extremity Assessment: LLE deficits/detail   LLE Deficits / Details: limited ROM and strength as expected s/p Lt TKA     Communication   Communication: No  difficulties  Cognition Arousal/Alertness: Awake/alert Behavior During Therapy: WFL for tasks assessed/performed Overall Cognitive Status: Within Functional Limits for tasks assessed                      General Comments      Exercises General Exercises - Lower Extremity Ankle Circles/Pumps: AROM;Both;10  reps;Seated Quad Sets: Strengthening;Left;10 reps;Seated Long Arc Quad: Strengthening;Left;10 reps;Seated Hip Flexion/Marching: Strengthening;Both;10 reps;Seated      Assessment/Plan    PT Assessment Patient needs continued PT services  PT Diagnosis Acute pain;Abnormality of gait   PT Problem List Decreased strength;Decreased range of motion;Decreased activity tolerance;Decreased balance;Decreased safety awareness;Decreased knowledge of use of DME;Decreased knowledge of precautions;Pain  PT Treatment Interventions DME instruction;Gait training;Functional mobility training;Therapeutic activities;Therapeutic exercise;Balance training;Neuromuscular re-education;Patient/family education;Modalities   PT Goals (Current goals can be found in the Care Plan section) Acute Rehab PT Goals Patient Stated Goal: to be able to plant tomatoes in his garden PT Goal Formulation: With patient Time For Goal Achievement: 09/27/15 Potential to Achieve Goals: Good    Frequency 7X/week   Barriers to discharge        Co-evaluation               End of Session Equipment Utilized During Treatment: Gait belt Activity Tolerance: Patient tolerated treatment well Patient left: in chair;with call bell/phone within reach;with nursing/sitter in room Nurse Communication: Mobility status;Precautions;Weight bearing status         Time: [id number] PT Time Calculation (min) (ACUTE ONLY): 21 min   Charges:   PT Evaluation $PT Eval Low Complexity: 1 Procedure     PT G CodesJoslyn Hy PT, DPT [phone] Pager: [phone]  09/20/2015, 5:00 PM

## 2015-09-21 ENCOUNTER — Encounter (HOSPITAL_COMMUNITY): Payer: Self-pay | Admitting: Orthopaedic Surgery

## 2015-09-21 NOTE — Progress Notes (Signed)
CSW consult for SNF. Per PT, pt appropriate for HHPT. CSW signing off.   Wandra Feinstein, MSW, LCSW

## 2015-09-21 NOTE — Discharge Instructions (Signed)
Information on my medicine - ELIQUIS® (apixaban) ° °This medication education was reviewed with me or my healthcare representative as part of my discharge preparation.  ° °Why was Eliquis® prescribed for you? °Eliquis® was prescribed for you to reduce the risk of blood clots forming after orthopedic surgery.   ° °What do You need to know about Eliquis®? °Take your Eliquis® TWICE DAILY - one tablet in the morning and one tablet in the evening with or without food.  It would be best to take the dose about the same time each day. ° °If you have difficulty swallowing the tablet whole please discuss with your pharmacist how to take the medication safely. ° °Take Eliquis® exactly as prescribed by your doctor and DO NOT stop taking Eliquis® without talking to the doctor who prescribed the medication.  Stopping without other medication to take the place of Eliquis® may increase your risk of developing a clot. ° °After discharge, you should have regular check-up appointments with your healthcare provider that is prescribing your Eliquis®. ° °What do you do if you miss a dose? °If a dose of ELIQUIS® is not taken at the scheduled time, take it as soon as possible on the same day and twice-daily administration should be resumed.  The dose should not be doubled to make up for a missed dose.  Do not take more than one tablet of ELIQUIS at the same time. ° °Important Safety Information °A possible side effect of Eliquis® is bleeding. You should call your healthcare provider right away if you experience any of the following: °? Bleeding from an injury or your nose that does not stop. °? Unusual colored urine (red or dark brown) or unusual colored stools (red or black). °? Unusual bruising for unknown reasons. °? A serious fall or if you hit your head (even if there is no bleeding). ° °Some medicines may interact with Eliquis® and might increase your risk of bleeding or clotting while on Eliquis®. To help avoid this, consult your  healthcare provider or pharmacist prior to using any new prescription or non-prescription medications, including herbals, vitamins, non-steroidal anti-inflammatory drugs (NSAIDs) and supplements. ° °This website has more information on Eliquis® (apixaban): http://www.eliquis.com/eliquis/home ° °

## 2015-09-21 NOTE — Anesthesia Postprocedure Evaluation (Signed)
Anesthesia Post Note  Patient: [name]  Procedure(s) Performed: Procedure(s) (LRB): TOTAL KNEE ARTHROPLASTY (Left)  Patient location during evaluation: PACU Anesthesia Type: Spinal Level of consciousness: awake and alert Pain management: pain level controlled Vital Signs Assessment: post-procedure vital signs reviewed and stable Respiratory status: spontaneous breathing, nonlabored ventilation, respiratory function stable and patient connected to nasal cannula oxygen Cardiovascular status: blood pressure returned to baseline and stable Postop Assessment: no signs of nausea or vomiting Anesthetic complications: no    Last Vitals:  Filed Vitals:   09/21/15 0830 09/21/15 1240  BP: 114/57 129/67  Pulse: 68 70  Temp: 36.5 C 36.2 C  Resp: 18 18    Last Pain:  Filed Vitals:   09/21/15 1241  PainSc: Asleep                 [name] S

## 2015-09-21 NOTE — Progress Notes (Signed)
Subjective: 1 Day Post-Op Procedure(s) (LRB): TOTAL KNEE ARTHROPLASTY (Left)  Activity level:  wbat Diet tolerance:  ok Voiding:  ok Patient reports pain as mild.    Objective: Vital signs in last 24 hours: Temp:  [97.3 F (36.3 C)-97.6 F (36.4 C)] 97.4 F (36.3 C) (02/01 0522) Pulse Rate:  [55-78] 63 (02/01 0522) Resp:  [9-18] 16 (02/01 0522) BP: (99-149)/(49-76) 127/61 mmHg (02/01 0522) SpO2:  [93 %-100 %] 98 % (02/01 0522) Weight:  [75.978 kg (167 lb 8 oz)] 75.978 kg (167 lb 8 oz) (01/31 0857)  Labs: No results for input(s): HGB in the last 72 hours. No results for input(s): WBC, RBC, HCT, PLT in the last 72 hours. No results for input(s): NA, K, CL, CO2, BUN, CREATININE, GLUCOSE, CALCIUM in the last 72 hours. No results for input(s): LABPT, INR in the last 72 hours.  Physical Exam:  Neurologically intact ABD soft Neurovascular intact Sensation intact distally Intact pulses distally Dorsiflexion/Plantar flexion intact Incision: dressing C/D/I and no drainage No cellulitis present Compartment soft  Assessment/Plan:  1 Day Post-Op Procedure(s) (LRB): TOTAL KNEE ARTHROPLASTY (Left) Advance diet Up with therapy D/C IV fluids Plan for discharge tomorrow Discharge home with home health if doing well and cleared by PT. On home elequis. Follow up in office 2 weeks post op. I will change his dressing to aquacel tomorrow.  [name], JYRON STUEWE 09/21/2015, 8:10 AM

## 2015-09-21 NOTE — Progress Notes (Signed)
Physical Therapy Treatment Patient Details Name: [name] MRN: [id number] DOB: [birth year] Today's Date: 09/21/2015    History of Present Illness Pt is a 82 y/o F s/p Lt TKA.  Pt's PMH includes anemia, acute respiratory failure, sepsis, chest pain, Rt TKA, atrial flutter.     PT Comments    Patient limited slightly by increased pain this session but still making progress toward mobility goals. Pt is very motivated to get independence back. Continue to progress as tolerated with anticipated d/c home with HHPT.  Follow Up Recommendations  Home health PT;Supervision for mobility/OOB     Equipment Recommendations  3in1 (PT)    Recommendations for Other Services OT consult     Precautions / Restrictions Precautions Precautions: Fall;Knee Precaution Booklet Issued: Yes (comment) Precaution Comments: Reviewed knee precautions w/ pt Required Braces or Orthoses: Knee Immobilizer - Left Knee Immobilizer - Left: Other (comment) (not specified; no need for KI this session) Restrictions Weight Bearing Restrictions: Yes LLE Weight Bearing: Weight bearing as tolerated    Mobility  Bed Mobility Overal bed mobility: Modified Independent Bed Mobility: Supine to Sit     Supine to sit: Modified independent (Device/Increase time)     General bed mobility comments: increased time and use of bedrails; no physical assist needed  Transfers Overall transfer level: Needs assistance Equipment used: Rolling walker (2 wheeled) Transfers: Sit to/from Stand Sit to Stand: Min guard         General transfer comment: cues for hand placement and technique; min guard for safety due to pt's c/o sharp pains with L knee flexion and extension; no instability noted upon standing  Ambulation/Gait Ambulation/Gait assistance: Supervision Ambulation Distance (Feet): 120 Feet Assistive device: Rolling walker (2 wheeled) Gait Pattern/deviations: Step-through pattern;Antalgic;Decreased stance time -  left;Decreased stride length;Trunk flexed   Gait velocity interpretation: Below normal speed for age/gender General Gait Details: vc for posture, position of RW, and sequencing; no knee buckling    Stairs            Wheelchair Mobility    Modified Rankin (Stroke Patients Only)       Balance Overall balance assessment: Needs assistance Sitting-balance support: No upper extremity supported;Feet supported Sitting balance-Leahy Scale: Good     Standing balance support: Bilateral upper extremity supported Standing balance-Leahy Scale: Fair                      Cognition Arousal/Alertness: Awake/alert Behavior During Therapy: WFL for tasks assessed/performed Overall Cognitive Status: Within Functional Limits for tasks assessed                      Exercises Total Joint Exercises Quad Sets: AROM;Both;10 reps;Supine Heel Slides: AROM;Left;10 reps;Supine Hip ABduction/ADduction: AROM;Left;10 reps;Supine Straight Leg Raises: AROM;Left;10 reps;Supine Goniometric ROM: 3-75    General Comments        Pertinent Vitals/Pain Pain Assessment: 0-10 Pain Score: 8  Pain Location: L knee with mobility Pain Descriptors / Indicators: Sharp;Sore Pain Intervention(s): Limited activity within patient's tolerance;Monitored during session;Repositioned;Ice applied;RN gave pain meds during session    Home Living                      Prior Function            PT Goals (current goals can now be found in the care plan section) Acute Rehab PT Goals Patient Stated Goal: to be able to plant tomatoes in his garden PT Goal Formulation: With patient  Time For Goal Achievement: 09/27/15 Potential to Achieve Goals: Good Progress towards PT goals: Progressing toward goals    Frequency  7X/week    PT Plan Current plan remains appropriate    Co-evaluation             End of Session Equipment Utilized During Treatment: Gait belt Activity Tolerance:  Patient tolerated treatment well Patient left: in chair;with call bell/phone within reach     Time: 0916-0948 PT Time Calculation (min) (ACUTE ONLY): 32 min  Charges:  $Gait Training: 8-22 mins $Therapeutic Exercise: 8-22 mins                    G Codes:      [place] April, PTA Pager: [phone]   09/21/2015, 10:00 AM

## 2015-09-21 NOTE — Progress Notes (Signed)
Occupational Therapy Evaluation Patient Details Name: KENNETH SLATTEN MRN: [id number] DOB: 02-27-1927 Today's Date: 09/21/2015    History of Present Illness Pt is a 82 y/o F s/p Lt TKA.  Pt's PMH includes anemia, acute respiratory failure, sepsis, chest pain, Rt TKA, atrial flutter.    Clinical Impression   PTA, pt independent with ADL and mobility. Pt currently requires min A with ADL and mobility. Pt states his friends will "stop by" when needed and "there is a lady down the street who will cook my food". If pt is going to have intermittent S, he will need to progress further before D/C home. Pt is at risk for falls at this time. Will plan to see in am to address ADL and mobility to facilitate safe D/C home. Recommend follow up with HHOT to maximize functional level of independence and reduce risk of falls.   Need to confirm amount of support available at D/C. Pt will need 24/7 S initially.     Follow Up Recommendations  Home health OT;Supervision/Assistance - 24 hour (initially)    Equipment Recommendations  3 in 1 bedside comode    Recommendations for Other Services       Precautions / Restrictions Precautions Precautions: Fall;Knee Precaution Booklet Issued: Yes (comment) Precaution Comments: Reviewed knee precautions w/ pt Required Braces or Orthoses: Knee Immobilizer - Left Knee Immobilizer - Left: Other (comment) (not specified; no need for KI this session) Restrictions Weight Bearing Restrictions: Yes LLE Weight Bearing: Weight bearing as tolerated      Mobility Bed Mobility               General bed mobility comments: OOB in chair  Transfers Overall transfer level: Needs assistance Equipment used: Rolling walker (2 wheeled)   Sit to Stand: Min guard              Balance     Sitting balance-Leahy Scale: Good       Standing balance-Leahy Scale: Fair                              ADL Overall ADL's : Needs assistance/impaired      Grooming: Supervision/safety;Standing Grooming Details (indicate cue type and reason): unsteday in standing at sink. 1 LOB  posteriorly when using BUE Upper Body Bathing: Set up   Lower Body Bathing: Minimal assistance;Sit to/from stand   Upper Body Dressing : Set up;Sitting   Lower Body Dressing: Minimal assistance;Sit to/from stand Lower Body Dressing Details (indicate cue type and reason): order states for LI. Pt does not know how to donn [employer] Transfer: Minimal assistance;RW;BSC Armed forces technical officer Details (indicate cue type and reason): cues to step backward with strong LE first  Toileting- Water quality scientist and Hygiene: Min guard;Sit to/from stand       Functional mobility during ADLs: Minimal assistance;Rolling walker;Cueing for safety General ADL Comments: Pt asks " are you going to catch me if I fall". Unsteady during synamic standing tasks. Began education on compensatory techniques for ADL.                      Pertinent Vitals/Pain Pain Assessment: 0-10 Pain Score: 5  Pain Location: L knee Pain Descriptors / Indicators: Aching Pain Intervention(s): Limited activity within patient's tolerance     Hand Dominance Right   Extremity/Trunk Assessment Upper Extremity Assessment Upper Extremity Assessment: Overall WFL for tasks assessed   Lower Extremity Assessment Lower Extremity Assessment:  LLE deficits/detail;Defer to PT evaluation LLE Deficits / Details: limited ROM and strength as expected s/p Lt TKA LLE Sensation:  (WNL)   Cervical / Trunk Assessment Cervical / Trunk Assessment: Normal   Communication Communication Communication: No difficulties   Cognition Arousal/Alertness: Awake/alert Behavior During Therapy: WFL for tasks assessed/performed Overall Cognitive Status: Within Functional Limits for tasks assessed                     General Comments       Exercises       Shoulder Instructions      Home Living Family/patient  expects to be discharged to:: Private residence Living Arrangements: Alone Available Help at Discharge: Friend(s);Available PRN/intermittently (neighbor) Type of Home: House Home Access: Stairs to enter [employer] of Steps: 1   Home Layout: One level     Bathroom Shower/Tub: Tub/shower unit;Door Shower/tub characteristics: Door Bathroom Toilet: Handicapped height Bathroom Accessibility: Yes How Accessible: Accessible via walker Home Equipment: Fults - 2 wheels;Cane - single point          Prior Functioning/Environment Level of Independence: Independent             OT Diagnosis: Generalized weakness;Acute pain   OT Problem List: Decreased strength;Decreased range of motion;Decreased activity tolerance;Impaired balance (sitting and/or standing);Decreased knowledge of use of DME or AE;Decreased safety awareness;Pain   OT Treatment/Interventions: Self-care/ADL training;DME and/or AE instruction;Therapeutic activities;Patient/family education    OT Goals(Current goals can be found in the care plan section) Acute Rehab OT Goals Patient Stated Goal: to drive OT Goal Formulation: With patient Time For Goal Achievement: 10/05/15 Potential to Achieve Goals: Good ADL Goals Pt Will Perform Lower Body Bathing: with supervision;sit to/from stand Pt Will Perform Lower Body Dressing: with supervision;sit to/from stand Pt Will Transfer to Toilet: with supervision;ambulating;bedside commode Pt Will Perform Toileting - Clothing Manipulation and hygiene: with modified independence;sit to/from stand  OT Frequency: Min 3X/week   Barriers to D/C: Decreased caregiver support  Pt states friends will stop in. Pt eval states 24/7 S?       Co-evaluation              End of Session Equipment Utilized During Treatment: Gait belt;Rolling walker CPM Left Knee CPM Left Knee: Off Nurse Communication: Mobility status  Activity Tolerance: Patient tolerated treatment  well Patient left: in chair;with call bell/phone within reach   Time: [id number] OT Time Calculation (min): 23 min Charges:  OT General Charges $OT Visit: 1 Procedure OT Evaluation $OT Eval Moderate Complexity: 1 Procedure OT Treatments $Self Care/Home Management : 8-22 mins G-Codes:    [place] Scorsone,HILLARY 10-01-15, 1:57 PM   [facility] OTR/L  [phone] 10/01/2015

## 2015-09-21 NOTE — Progress Notes (Signed)
Physical Therapy Treatment Patient Details Name: [name] MRN: [id number] DOB: 06/23/1927 Today's Date: 09/21/2015    History of Present Illness Pt is a 82 y/o F s/p Lt TKA.  Pt's PMH includes anemia, acute respiratory failure, sepsis, chest pain, Rt TKA, atrial flutter.     PT Comments    Patient limited by pain and required encouragement to ambulate this session. Pt demonstrated decreased safety awareness and mobility this session. Pt will need 24/7 assistance for d/c home.    Follow Up Recommendations  Home health PT;Supervision/assistance-24 hour     Equipment Recommendations  3in1 (PT)    Recommendations for Other Services OT consult     Precautions / Restrictions Precautions Precautions: Fall;Knee Precaution Booklet Issued: Yes (comment) Precaution Comments: Reviewed knee precautions w/ pt Required Braces or Orthoses: Knee Immobilizer - Left Knee Immobilizer - Left: Other (comment) (not specified; no need for KI this session) Restrictions Weight Bearing Restrictions: Yes LLE Weight Bearing: Weight bearing as tolerated    Mobility  Bed Mobility Overal bed mobility: Modified Independent Bed Mobility: Supine to Sit     Supine to sit: Modified independent (Device/Increase time)     General bed mobility comments: vc for technique and use of bedrails; extra time needed  Transfers Overall transfer level: Needs assistance Equipment used: Rolling walker (2 wheeled)   Sit to Stand: Min guard         General transfer comment: vc for hand placement (2 trial from EOB and BSC) and increased time to achieve erect posture and transition hands from EOB to RW  Ambulation/Gait Ambulation/Gait assistance: Min guard Ambulation Distance (Feet): 40 Feet Assistive device: Rolling walker (2 wheeled) Gait Pattern/deviations: Step-through pattern;Decreased stride length;Antalgic;Trunk flexed   Gait velocity interpretation: Below normal speed for age/gender General Gait  Details: pt with decreasd activity tolerance and increased trunk flexion this session; vc for posture and position of RW; pt demonstrated decreased safety awareness beginning to stand from EOB without RW; pt educated on importance of using AD and his risk of falls   Financial trader Rankin (Stroke Patients Only)       Balance Overall balance assessment: Needs assistance Sitting-balance support: No upper extremity supported Sitting balance-Leahy Scale: Good     Standing balance support: Bilateral upper extremity supported Standing balance-Leahy Scale: Fair                      Cognition Arousal/Alertness: Awake/alert Behavior During Therapy: WFL for tasks assessed/performed Overall Cognitive Status: Within Functional Limits for tasks assessed                      Exercises Total Joint Exercises Quad Sets: AROM;10 reps;Supine;Left Short Arc Quad: AROM;Left;10 reps;Supine Long Arc Quad: AROM;Left;10 reps;Seated    General Comments        Pertinent Vitals/Pain Pain Assessment: 0-10 Pain Score: 6  Pain Location: L knee with flexion Pain Descriptors / Indicators: Sore Pain Intervention(s): Limited activity within patient's tolerance;Monitored during session;Premedicated before session;Repositioned    Home Living Family/patient expects to be discharged to:: Private residence Living Arrangements: Alone Available Help at Discharge: Friend(s);Available PRN/intermittently (neighbor) Type of Home: House Home Access: Stairs to enter   Home Layout: One level Home Equipment: Environmental consultant - 2 wheels;Cane - single point      Prior Function Level of Independence: Independent  PT Goals (current goals can now be found in the care plan section) Acute Rehab PT Goals Patient Stated Goal: to be able to plant tomatoes in his garden PT Goal Formulation: With patient Time For Goal Achievement: 09/27/15 Potential to Achieve  Goals: Good Progress towards PT goals: PT to reassess next treatment    Frequency  7X/week    PT Plan Current plan remains appropriate    Co-evaluation             End of Session Equipment Utilized During Treatment: Gait belt Activity Tolerance: Patient tolerated treatment well Patient left: in chair;with call bell/phone within reach     Time: 1450-1521 PT Time Calculation (min) (ACUTE ONLY): 31 min  Charges:  $Gait Training: 8-22 mins $Therapeutic Exercise: 8-22 mins                    G Codes:      [name], PTA Pager: [phone]   09/21/2015, 4:23 PM

## 2015-09-22 NOTE — Progress Notes (Signed)
Occupational Therapy Treatment Patient Details Name: [name] MRN: [id number] DOB: 09/21/1926 Today's Date: 09/22/2015    History of present illness Pt is a 82 y/o F s/p Lt TKA.  Pt's PMH includes anemia, acute respiratory failure, sepsis, chest pain, Rt TKA, atrial flutter.    OT comments  Patient progressing towards OT goals, continue plan of care for now. Discharge recommendation changed from home to SNF. Pt eager to go to rehab prior to going home to live alone. Agree with this and feel pt will best benefit from ST SNF prior to discharge back home.    Follow Up Recommendations  SNF;Supervision/Assistance - 24 hour    Equipment Recommendations  Other (comment) (TBD next venue of care)    Recommendations for Other Services  None at this time   Precautions / Restrictions Precautions Precautions: Fall;Knee Precaution Booklet Issued: Yes (comment) Precaution Comments: Reviewed knee precautions w/ pt Required Braces or Orthoses: Knee Immobilizer - Left Knee Immobilizer - Left: Other (comment) (not specificed, used during OT session secondary to pt unable to complete SLR independently) Restrictions Weight Bearing Restrictions: Yes LLE Weight Bearing: Weight bearing as tolerated    Mobility Bed Mobility Overal bed mobility: Needs Assistance Bed Mobility: Sit to Supine     Supine to sit: Supervision     General bed mobility comments: supervision for safety  Transfers Overall transfer level: Needs assistance Equipment used: Rolling walker (2 wheeled) Transfers: Sit to/from Stand Sit to Stand: Min guard         General transfer comment: Min guard for safety, cues for hand placement and overall safety    Balance Overall balance assessment: Needs assistance Sitting-balance support: No upper extremity supported;Feet supported Sitting balance-Leahy Scale: Good     Standing balance support: Bilateral upper extremity supported;During functional activity Standing  balance-Leahy Scale: Fair Standing balance comment: RW for UE support   ADL Overall ADL's : Needs assistance/impaired General ADL Comments: Pt found seated on BSC over toilet seat in BR. Pt eager to get back to bed. Assisted pt with sit to stand from [facility] and pt ambulated back to bed using RW. Pt required min guard assist and min verbal cues for safety. Educated pt on [place] for when OOB, no pillow under knee, and importance of 24/7 supervision. Pt states his plan is to go to [facility] center now.      Cognition   Behavior During Therapy: WFL for tasks assessed/performed Overall Cognitive Status: Within Functional Limits for tasks assessed                 Pertinent Vitals/ Pain       Pain Assessment: 0-10 Pain Score: 10-Worst pain ever Pain Location: L knee Pain Descriptors / Indicators: Aching;Sore;Guarding Pain Intervention(s): Limited activity within patient's tolerance;Monitored during session;Repositioned   Frequency Min 2X/week     Progress Toward Goals  OT Goals(current goals can now befound in the care plan section)  Progress towards OT goals: Progressing toward goals  Acute Rehab OT Goals Patient Stated Goal: go to rehab before going home OT Goal Formulation: With patient Time For Goal Achievement: 10/05/15 Potential to Achieve Goals: Good  Plan Discharge plan needs to be updated    End of Session Equipment Utilized During Treatment: Gait belt;Rolling walker   Activity Tolerance Patient tolerated treatment well   Patient Left with call bell/phone within reach;in bed  Nurse Communication Mobility status     Time: [id number] OT Time Calculation (min): 13 min  Charges: OT General Charges $  OT Visit: 1 Procedure OT Treatments $Self Care/Home Management : 8-22 mins  Chrys Racer , MS, OTR/L, [place] Pager: [phone]  09/22/2015, 1:45 PM

## 2015-09-22 NOTE — Progress Notes (Signed)
Physical Therapy Treatment Patient Details Name: [name] MRN: [id number] DOB: [birth year] Today's Date: 09/22/2015    History of Present Illness Pt is a 82 y/o F s/p Lt TKA.  Pt's PMH includes anemia, acute respiratory failure, sepsis, chest pain, Rt TKA, atrial flutter.     PT Comments    Patient limited by pain this session. Continue to progress as tolerated.   Follow Up Recommendations  Supervision/Assistance - 24 hour;SNF     Equipment Recommendations  3in1 (PT)    Recommendations for Other Services OT consult     Precautions / Restrictions Precautions Precautions: Fall;Knee Precaution Booklet Issued: Yes (comment) Precaution Comments: Reviewed knee precautions w/ pt Required Braces or Orthoses: Knee Immobilizer - Left Knee Immobilizer - Left: Other (comment) (not specified; no need for KI this session) Restrictions Weight Bearing Restrictions: Yes LLE Weight Bearing: Weight bearing as tolerated    Mobility  Bed Mobility Overal bed mobility: Needs Assistance Bed Mobility: Supine to Sit     Supine to sit: Min guard     General bed mobility comments: min guard for safety; vc for sequencing; min use of bedrails; HOB flat; no physical assist needed  Transfers Overall transfer level: Needs assistance Equipment used: Rolling walker (2 wheeled) Transfers: Sit to/from Stand Sit to Stand: Min guard;Min assist         General transfer comment: vc for hand placement from EOB with carry over of hand placement and technique for descent to chair  Ambulation/Gait Ambulation/Gait assistance: Min guard Ambulation Distance (Feet): 45 Feet Assistive device: Rolling walker (2 wheeled) Gait Pattern/deviations: Step-to pattern;Step-through pattern;Decreased step length - right;Decreased stance time - left;Decreased dorsiflexion - right;Decreased dorsiflexion - left;Antalgic;Trunk flexed     General Gait Details: vc for posture, position of RW, and engaging L quad before  WS to L LE; pt with tendency to keep L knee slightly flexed; guarded movements this session   Stairs            Wheelchair Mobility    Modified Rankin (Stroke Patients Only)       Balance Overall balance assessment: Needs assistance Sitting-balance support: No upper extremity supported;Feet supported Sitting balance-Leahy Scale: Good     Standing balance support: Bilateral upper extremity supported Standing balance-Leahy Scale: Fair Standing balance comment: RW for UE support                    Cognition Arousal/Alertness: Awake/alert Behavior During Therapy: WFL for tasks assessed/performed Overall Cognitive Status: Within Functional Limits for tasks assessed                      Exercises      General Comments        Pertinent Vitals/Pain Pain Assessment: Faces Pain Score: 10-Worst pain ever Faces Pain Scale: Hurts little more Pain Location: L knee Pain Descriptors / Indicators: Sore;Aching Pain Intervention(s): Limited activity within patient's tolerance;Monitored during session;Repositioned;Patient requesting pain meds-RN notified    Home Living                      Prior Function            PT Goals (current goals can now be found in the care plan section) Acute Rehab PT Goals Patient Stated Goal: to be able to plant tomatoes in his garden PT Goal Formulation: With patient Time For Goal Achievement: 09/27/15 Potential to Achieve Goals: Good Progress towards PT goals: Not progressing toward goals -  comment (limited by pain)    Frequency  7X/week    PT Plan Current plan remains appropriate    Co-evaluation             End of Session Equipment Utilized During Treatment: Gait belt Activity Tolerance: Patient limited by pain Patient left: in chair;with call bell/phone within reach;with family/visitor present     Time: [id number] PT Time Calculation (min) (ACUTE ONLY): 21 min  Charges:  $Gait Training: 8-22  mins                    G Codes:      Salina April, PTA Pager: [phone]   09/22/2015, 4:51 PM

## 2015-09-22 NOTE — Clinical Social Work Placement (Signed)
   CLINICAL SOCIAL WORK PLACEMENT  NOTE  Date:  09/22/2015  Patient Details  Name: [name] MRN: [id number] Date of Birth: 08-23-1926  Clinical Social Work is seeking post-discharge placement for this patient at the [place] level of care (*CSW will initial, date and re-position this form in  chart as items are completed):  Yes   Patient/family provided with [place] Work Department's list of facilities offering this level of care within the geographic area requested by the patient (or if unable, by the patient's family).  Yes   Patient/family informed of their freedom to choose among providers that offer the needed level of care, that participate in Medicare, Medicaid or managed care program needed by the patient, have an available bed and are willing to accept the patient.  Yes   Patient/family informed of [place]'s ownership interest in [facility] and [facility], as well as of the fact that they are under no obligation to receive care at these facilities.  PASRR submitted to EDS on       PASRR number received on       Existing PASRR number confirmed on 09/22/15     FL2 transmitted to all facilities in geographic area requested by pt/family on 09/22/15     FL2 transmitted to all facilities within larger geographic area on       Patient informed that his/her managed care company has contracts with or will negotiate with certain facilities, including the following:            Patient/family informed of bed offers received.  Patient chooses bed at       Physician recommends and patient chooses bed at      Patient to be transferred to   on  .  Patient to be transferred to facility by       Patient family notified on   of transfer.  Name of family member notified:        PHYSICIAN       Additional Comment:    _______________________________________________ Dulcy Fanny, LCSW 09/22/2015, 12:17 PM

## 2015-09-22 NOTE — Progress Notes (Signed)
Subjective: 2 Days Post-Op Procedure(s) (LRB): TOTAL KNEE ARTHROPLASTY (Left)   Patient in a little more pain today. PT and OT now recommending SNF.  Activity level:  WBAT Diet tolerance:  ok Voiding:  ok Patient reports pain as mild and moderate.    Objective: Vital signs in last 24 hours: Temp:  [97.5 F (36.4 C)-98.7 F (37.1 C)] 97.5 F (36.4 C) (02/02 1340) Pulse Rate:  [64-82] 66 (02/02 1340) Resp:  [18] 18 (02/02 1340) BP: (112-126)/(49-61) 122/61 mmHg (02/02 1340) SpO2:  [94 %-98 %] 98 % (02/02 1340)  Labs: No results for input(s): HGB in the last 72 hours. No results for input(s): WBC, RBC, HCT, PLT in the last 72 hours. No results for input(s): NA, K, CL, CO2, BUN, CREATININE, GLUCOSE, CALCIUM in the last 72 hours. No results for input(s): LABPT, INR in the last 72 hours.  Physical Exam:  Neurologically intact ABD soft Neurovascular intact Sensation intact distally Intact pulses distally Dorsiflexion/Plantar flexion intact Incision: dressing C/D/I and no drainage No cellulitis present Compartment soft  Assessment/Plan:  2 Days Post-Op Procedure(s) (LRB): TOTAL KNEE ARTHROPLASTY (Left) Advance diet Up with therapy Plan for discharge tomorrow Discharge to SNF if doing well and cleared by PT. I changed dressing to aquacel today. Continue on home eliquis. Follow up in office 2 weeks post op.    [name], JEREMEE DOMEN 09/22/2015, 1:52 PM

## 2015-09-22 NOTE — Clinical Social Work Note (Signed)
CSW spoke with patient regarding discharge plans.  Patient chose [facility]  CSW spoke with [place], [facility] liaison who states she is able to offer a bed to the patient.  Tammy will begin insurance authorization ([employer] not [employer]) and anticipates auth by 2/3.  Patient states he his brother will transport him to SNF at time of discharge.  Nonnie Done, LCSW [phone]  [id number]; 2S 15-16 and [place] Licensed Clinical Social Worker

## 2015-09-22 NOTE — NC FL2 (Signed)
[place] LEVEL OF CARE SCREENING TOOL     IDENTIFICATION  Patient Name: [name] Birthdate: 12-Jun-1927 Sex: male Admission Date (Current Location): 09/20/2015  [facility] and [place] Number:  [employer] and Address:  [facility] [place] [place] [address], [place], [place] [address]      Provider Number: [id number]  Attending Physician Name and Address:  Melrose Nakayama, MD  Relative Name and Phone Number:       Current Level of Care: Hospital Recommended Level of Care: [place] Prior Approval Number:    Date Approved/Denied:   PASRR Number: [id number]  Discharge Plan: SNF    Current Diagnoses: Patient Active Problem List   Diagnosis Date Noted  . Primary osteoarthritis of left knee 09/20/2015  . Primary osteoarthritis of knee 09/20/2015  . Anemia 04/15/2015  . Anemia, chronic renal failure 04/15/2015  . Hypokalemia 04/15/2015  . Edema 04/12/2015  . Hyperbilirubinemia   . Choledocholithiasis 04/06/2015  . Encounter for nasogastric (NG) tube placement   . Hypoxia   . Abdominal pain, epigastric   . Abnormal liver enzymes   . Abnormal LFTs   . Biliary sepsis   . Acute respiratory failure ([place])   . Severe sepsis ([place])   . Bacteremia due to Gram-negative bacteria ([place])   . Hiatal hernia   . Sepsis ([place]) 03/28/2015  . Incisional hernia, without obstruction or gangrene 08/17/2014  . Atrial flutter ([place]) 07/28/2014  . Palpitation 06/16/2014  . Pain in the chest 06/10/2014  . Right knee DJD 03/11/2014  . S/P CABG x 4 09/03/2013  . Insomnia 07/27/2013  . Testosterone deficiency 07/27/2013  . Osteoarthritis of right knee 07/27/2013  . Hyperlipidemia 06/10/2013  . Heart murmur 06/10/2013  . Dyspnea 06/10/2013  . Inguinal hernia, left. 04/04/2012  . Paraesophageal hiatal hernia 03/24/2012  . Prostate cancer ([place]) 01/15/2012  . Special screening for malignant neoplasms, colon 01/15/2012    Orientation  RESPIRATION BLADDER Height & Weight     Self, Time, Situation, Place  Normal Continent Weight: 167 lb 8 oz (75.978 kg) Height:  5\' 11"  (180.3 cm)  BEHAVIORAL SYMPTOMS/MOOD NEUROLOGICAL BOWEL NUTRITION STATUS      Continent    AMBULATORY STATUS COMMUNICATION OF NEEDS Skin   Limited Assist Verbally Surgical wounds                       Personal Care Assistance Level of Assistance  Bathing, Dressing Bathing Assistance: Limited assistance   Dressing Assistance: Limited assistance     Functional Limitations Info             SPECIAL CARE FACTORS FREQUENCY  PT (By licensed PT), OT (By licensed OT)     PT Frequency: daily OT Frequency: daily            Contractures Contractures Info: Not present    Additional Factors Info  Allergies   Allergies Info: lipitor, penicillins           Current Medications (09/22/2015):  This is the current hospital active medication list Current Facility-Administered Medications  Medication Dose Route Frequency Provider Last Rate Last Dose  . acetaminophen (TYLENOL) tablet 650 mg  650 mg Oral Q6H PRN Loni Dolly, PA-C       Or  . acetaminophen (TYLENOL) suppository 650 mg  650 mg Rectal Q6H PRN Loni Dolly, PA-C      . alum & mag hydroxide-simeth (MAALOX/MYLANTA) 200-200-20 MG/5ML suspension 30 mL  30  mL Oral Q4H PRN Loni Dolly, PA-C   30 mL at 09/21/15 2103  . apixaban (ELIQUIS) tablet 2.5 mg  2.5 mg Oral BID Loni Dolly, PA-C   2.5 mg at 09/22/15 1113  . bisacodyl (DULCOLAX) EC tablet 5 mg  5 mg Oral Daily PRN Loni Dolly, PA-C   5 mg at 09/21/15 2005  . diltiazem (CARDIZEM CD) 24 hr capsule 240 mg  240 mg Oral Daily Loni Dolly, PA-C   240 mg at 09/22/15 1112  . diphenhydrAMINE (BENADRYL) 12.5 MG/5ML elixir 12.5-25 mg  12.5-25 mg Oral Q4H PRN Loni Dolly, PA-C      . docusate sodium (COLACE) capsule 100 mg  100 mg Oral BID Loni Dolly, PA-C   100 mg at 09/22/15 1112  . furosemide (LASIX) tablet 40 mg  40 mg Oral Q breakfast Loni Dolly, PA-C   40 mg at 09/22/15 1113  . HYDROcodone-acetaminophen (NORCO/VICODIN) 5-325 MG per tablet 1-2 tablet  1-2 tablet Oral Q4H PRN Loni Dolly, PA-C   1 tablet at 09/22/15 1112  . HYDROmorphone (DILAUDID) injection 0.5-1 mg  0.5-1 mg Intravenous Q3H PRN Loni Dolly, PA-C   1 mg at 09/20/15 1950  . lactated ringers infusion   Intravenous Continuous Loni Dolly, PA-C   Stopped at 09/21/15 0900  . levothyroxine (SYNTHROID, LEVOTHROID) tablet 25 mcg  25 mcg Oral QAC breakfast Loni Dolly, PA-C   25 mcg at 09/22/15 0556  . menthol-cetylpyridinium (CEPACOL) lozenge 3 mg  1 lozenge Oral PRN Loni Dolly, PA-C       Or  . phenol (CHLORASEPTIC) mouth spray 1 spray  1 spray Mouth/Throat PRN Loni Dolly, PA-C      . methocarbamol (ROBAXIN) tablet 500 mg  500 mg Oral Q6H PRN Loni Dolly, PA-C   500 mg at 09/21/15 [id number]   Or  . methocarbamol (ROBAXIN) 500 mg in dextrose 5 % 50 mL IVPB  500 mg Intravenous Q6H PRN Loni Dolly, PA-C      . metoCLOPramide (REGLAN) tablet 5-10 mg  5-10 mg Oral Q8H PRN Loni Dolly, PA-C       Or  . metoCLOPramide (REGLAN) injection 5-10 mg  5-10 mg Intravenous Q8H PRN Loni Dolly, PA-C      . ondansetron [facility]) tablet 4 mg  4 mg Oral Q6H PRN Loni Dolly, PA-C       Or  . ondansetron [facility]) injection 4 mg  4 mg Intravenous Q6H PRN Loni Dolly, PA-C      . oxybutynin (DITROPAN) tablet 5 mg  5 mg Oral QPC breakfast Loni Dolly, PA-C   5 mg at 09/22/15 1112  . pantoprazole (PROTONIX) EC tablet 80 mg  80 mg Oral Daily Loni Dolly, PA-C   80 mg at 09/22/15 1112  . pramipexole (MIRAPEX) tablet 1 mg  1 mg Oral Q24H Loni Dolly, PA-C   1 mg at 09/21/15 1552  . pravastatin (PRAVACHOL) tablet 40 mg  40 mg Oral q1800 Loni Dolly, PA-C   40 mg at 09/21/15 1713     Discharge Medications: Please see discharge summary for a list of discharge medications.  Relevant Imaging Results:  Relevant Lab Results:   Additional Information SSN: [id number]  Dulcy Fanny,  LCSW

## 2015-09-22 NOTE — Progress Notes (Signed)
Physical Therapy Treatment Patient Details Name: [name] MRN: [id number] DOB: Sep 14, 1926 Today's Date: 09/22/2015    History of Present Illness Pt is a 82 y/o F s/p Lt TKA.  Pt's PMH includes anemia, acute respiratory failure, sepsis, chest pain, Rt TKA, atrial flutter.     PT Comments    Patient is making little progress toward mobility goals and continues to demonstrate decreased safety awareness with transfers. Due to pt's current mobility level and lack of assistance at home recommending SNF for further skilled PT services to increase independence and safety with mobility. Patient reported that he does not think he can manage at home by himself and agrees that he would benefit from going to SNF before returning home.   Follow Up Recommendations  Supervision/Assistance - 24 hour;SNF     Equipment Recommendations  3in1 (PT)    Recommendations for Other Services OT consult     Precautions / Restrictions Precautions Precautions: Fall;Knee Precaution Booklet Issued: Yes (comment) Precaution Comments: Reviewed knee precautions w/ pt Required Braces or Orthoses: Knee Immobilizer - Left Knee Immobilizer - Left: Other (comment) (not specified; no need for KI this session) Restrictions Weight Bearing Restrictions: Yes LLE Weight Bearing: Weight bearing as tolerated    Mobility  Bed Mobility               General bed mobility comments: OOB in chair upon arrival  Transfers Overall transfer level: Needs assistance Equipment used: Rolling walker (2 wheeled) Transfers: Sit to/from Stand Sit to Stand: Min guard;Min assist         General transfer comment: min guard for safety; increased time needed for stand but no physical assist needed; pt with tendency to prematurely begin sitting and had slight LOB reaching for armrest requiring min A to regain balance; vc for technique of safe descent to chair    Ambulation/Gait Ambulation/Gait assistance: Min guard Ambulation  Distance (Feet): 100 Feet Assistive device: Rolling walker (2 wheeled) Gait Pattern/deviations: Step-through pattern;Decreased stride length;Decreased step length - right;Decreased stance time - left;Decreased dorsiflexion - right;Decreased dorsiflexion - left;Trunk flexed     General Gait Details: vc for position of RW, maintainign upright posture, and bilat heel strike   Stairs            Wheelchair Mobility    Modified Rankin (Stroke Patients Only)       Balance Overall balance assessment: Needs assistance Sitting-balance support: No upper extremity supported;Feet supported Sitting balance-Leahy Scale: Good     Standing balance support: Bilateral upper extremity supported Standing balance-Leahy Scale: Fair                      Engineer, manufacturing systems Sets: AROM;10 reps;Supine;Left Short Arc Quad: AROM;Left;10 reps;Supine Heel Slides: AROM;Left;10 reps;Seated Long Arc Quad: AROM;Left;10 reps;Seated Goniometric ROM: 0-80    General Comments        Pertinent Vitals/Pain Pain Assessment: 0-10 Pain Score: 5  Pain Location: L knee cap Pain Descriptors / Indicators: Sore;Sharp Pain Intervention(s): Limited activity within patient's tolerance;Monitored during session;Premedicated before session;Repositioned    Home Living                      Prior Function            PT Goals (current goals can now be  found in the care plan section) Acute Rehab PT Goals Patient Stated Goal: to be able to plant tomatoes in his garden PT Goal Formulation: With patient Time For Goal Achievement: 09/27/15 Potential to Achieve Goals: Good Progress towards PT goals: Progressing toward goals    Frequency  7X/week    PT Plan Current plan remains appropriate    Co-evaluation             End of Session Equipment Utilized During Treatment: Gait belt Activity Tolerance: Patient tolerated  treatment well Patient left: in chair;with call bell/phone within reach     Time: 1018-1046 PT Time Calculation (min) (ACUTE ONLY): 28 min  Charges:  $Gait Training: 8-22 mins $Therapeutic Exercise: 8-22 mins                    G Codes:      Sal[place] April, PTA Pager: [phone]   09/22/2015, 11:36 AM

## 2015-09-23 ENCOUNTER — Inpatient Hospital Stay
Admission: RE | Admit: 2015-09-23 | Discharge: 2015-09-30 | Disposition: A | Discharge status: Home / Self Care | Payer: Medicare PPO | Source: Ambulatory Visit | Attending: Internal Medicine | Admitting: Internal Medicine

## 2015-09-23 MED ORDER — HYDROCODONE-ACETAMINOPHEN 5-325 MG PO TABS: 1.0000 | ORAL_TABLET | ORAL | Status: DC | PRN

## 2015-09-23 MED ORDER — METHOCARBAMOL 500 MG PO TABS: 500.0000 mg | ORAL_TABLET | Freq: Four times a day (QID) | ORAL | Status: DC | PRN

## 2015-09-23 NOTE — Care Management Important Message (Signed)
Important Message  Patient Details  Name: [name] MRN: [id number] Date of Birth: 1927-04-01   Medicare Important Message Given:  Yes    Lavaughn Haberle P Dewain Platz 09/23/2015, 4:18 PM

## 2015-09-23 NOTE — Discharge Planning (Signed)
Report called to Santiago Glad at [facility].

## 2015-09-23 NOTE — Clinical Social Work Note (Signed)
Clinical Social Worker facilitated patient discharge including contacting patient family and facility to confirm patient discharge plans.  Clinical information faxed to facility and family agreeable with plan.  Insurance authorization received from [place].  CSW arranged transport via family to [facility].  RN to call report prior to discharge.  Clinical Social Worker will sign off for now as social work intervention is no longer needed. Please consult [place] again if new need arises.  Barbette Or, [place]

## 2015-09-23 NOTE — Clinical Social Work Placement (Signed)
   CLINICAL SOCIAL WORK PLACEMENT  NOTE  Date:  09/23/2015  Patient Details  Name: TAJAI LENNARTZ MRN: [id number] Date of Birth: 01-15-1927  Clinical Social Work is seeking post-discharge placement for this patient at the [place] level of care (*CSW will initial, date and re-position this form in  chart as items are completed):  Yes   Patient/family provided with [place] Work Department's list of facilities offering this level of care within the geographic area requested by the patient (or if unable, by the patient's family).  Yes   Patient/family informed of their freedom to choose among providers that offer the needed level of care, that participate in Medicare, Medicaid or managed care program needed by the patient, have an available bed and are willing to accept the patient.  Yes   Patient/family informed of [facility]'s ownership interest in [facility] and [facility], as well as of the fact that they are under no obligation to receive care at these facilities.  PASRR submitted to EDS on       PASRR number received on       Existing PASRR number confirmed on 09/22/15     FL2 transmitted to all facilities in geographic area requested by pt/family on 09/22/15     FL2 transmitted to all facilities within larger geographic area on       Patient informed that his/her managed care company has contracts with or will negotiate with certain facilities, including the following:        Yes   Patient/family informed of bed offers received.  Patient chooses bed at [facility]     Physician recommends and patient chooses bed at      Patient to be transferred to [facility] on 09/23/15.  Patient to be transferred to facility by [facility]     Patient family notified on 09/23/15 of transfer.  Name of family member notified:  Patient to notify brother for transport     PHYSICIAN       Additional Comment:    Barbette Or,  [place]

## 2015-09-23 NOTE — Discharge Summary (Signed)
Patient ID: [name] MRN: [id number] DOB/AGE: [birth year] 80 y.o.  Admit date: 09/20/2015 Discharge date: 09/23/2015  Admission Diagnoses:  Principal Problem:   Primary osteoarthritis of left knee Active Problems:   Primary osteoarthritis of knee   Discharge Diagnoses:  Same  Past Medical History  Diagnosis Date  . Prostate cancer ([place])   . GERD (gastroesophageal reflux disease)     Large HH with intrathoracic component.   . Hypertension   . Hyperlipemia   . Arthritis   . Inguinal hernia     left  . Coronary artery disease   . Dysrhythmia   . Pneumonia   . H/O hiatal hernia   . Choledocholithiasis   . Hypothyroidism   . Atrial fibrillation and flutter ([place])   . CKD (chronic kidney disease) 08/2013    stage 3    Surgeries: Procedure(s): TOTAL KNEE ARTHROPLASTY on 09/20/2015   Consultants:    Discharged Condition: Improved  Hospital Course: [name] is an 82 y.o. male who was admitted 09/20/2015 for operative treatment ofPrimary osteoarthritis of left knee. Patient has severe unremitting pain that affects sleep, daily activities, and work/hobbies. After pre-op clearance the patient was taken to the operating room on 09/20/2015 and underwent  Procedure(s): TOTAL KNEE ARTHROPLASTY.    Patient was given perioperative antibiotics: Anti-infectives    Start     Dose/Rate Route Frequency Ordered Stop   09/20/15 2230  vancomycin (VANCOCIN) IVPB 1000 mg/200 mL premix     1,000 mg 200 mL/hr over 60 Minutes Intravenous Every 12 hours 09/20/15 1539 09/20/15 2222   09/20/15 1030  vancomycin (VANCOCIN) IVPB 1000 mg/200 mL premix     1,000 mg 200 mL/hr over 60 Minutes Intravenous To ShortStay Surgical 09/19/15 1016 09/20/15 1133       Patient was given sequential compression devices, early ambulation, and chemoprophylaxis to prevent DVT.  Patient benefited maximally from hospital stay and there were no complications.    Recent vital signs: Patient Vitals for the past 24  hrs:  BP Temp Temp src Pulse Resp SpO2  09/23/15 0500 (!) 128/58 mmHg 97.7 F (36.5 C) Oral 69 18 99 %  09/22/15 2135 (!) 104/54 mmHg 98.2 F (36.8 C) Oral 70 16 96 %  09/22/15 1340 122/61 mmHg 97.5 F (36.4 C) Oral 66 18 98 %  09/22/15 1112 (!) 126/49 mmHg - - - - -     Recent laboratory studies: No results for input(s): WBC, HGB, HCT, PLT, NA, K, CL, CO2, BUN, CREATININE, GLUCOSE, INR, CALCIUM in the last 72 hours.  Invalid input(s): PT, 2   Discharge Medications:     Medication List    TAKE these medications        apixaban 2.5 MG Tabs tablet  Commonly known as:  ELIQUIS  Take 1 tablet (2.5 mg total) by mouth 2 (two) times daily.     diltiazem 240 MG 24 hr capsule  Commonly known as:  DILTIAZEM CD  Take 1 capsule (240 mg total) by mouth daily.     furosemide 40 MG tablet  Commonly known as:  LASIX  Take 1 tablet (40 mg total) by mouth daily.     HYDROcodone-acetaminophen 5-325 MG tablet  Commonly known as:  NORCO/VICODIN  Take 1-2 tablets by mouth every 4 (four) hours as needed (breakthrough pain).     levothyroxine 25 MCG tablet  Commonly known as:  SYNTHROID, LEVOTHROID  Take 1 tablet (25 mcg total) by mouth daily before breakfast.  methocarbamol 500 MG tablet  Commonly known as:  ROBAXIN  Take 1 tablet (500 mg total) by mouth every 6 (six) hours as needed for muscle spasms.     omeprazole 40 MG capsule  Commonly known as:  PRILOSEC  Take 1 capsule (40 mg total) by mouth daily.     oxybutynin 5 MG tablet  Commonly known as:  DITROPAN  Take 1 tablet (5 mg total) by mouth daily.     pramipexole 1 MG tablet  Commonly known as:  MIRAPEX  Take 1 tablet (1 mg total) by mouth at bedtime.     pravastatin 40 MG tablet  Commonly known as:  PRAVACHOL  Take 1 tablet (40 mg total) by mouth daily.     Vitamin D3 1000 units Caps  Take 1,000 Units by mouth daily.        Diagnostic Studies: Dg Chest 2 View  09/13/2015  CLINICAL DATA:  Preoperative  examination prior to knee replacement, no current chest complaints, history of previous CABG ; former smoker. EXAM: CHEST  2 VIEW COMPARISON:  Chest x-ray of April 12, 2015 FINDINGS: The lungs are adequately inflated. There is no focal infiltrate. There is no pleural effusion. The cardiac silhouette remains enlarged. A large hiatal hernia with an air-fluid level is present. The pulmonary vascularity is normal. There is 7 intact sternal wires from previous CABG. There is mild multilevel degenerative disc disease of the thoracic spine. IMPRESSION: 1. Stable cardiomegaly without pulmonary vascular congestion. Stable post CABG changes. 2. There is no evidence of pneumonia. 3. Stable large hiatal hernia-partially intra thoracic stomach. Electronically Signed   By: David  Martinique M.D.   On: 09/13/2015 14:27    Disposition: 01-Home or Self Care      Discharge Instructions    Call MD / Call 911    Complete by:  As directed   If you experience chest pain or shortness of breath, CALL 911 and be transported to the hospital emergency room.  If you develope a fever above 101 F, pus (white drainage) or increased drainage or redness at the wound, or calf pain, call your surgeon's office.     Constipation Prevention    Complete by:  As directed   Drink plenty of fluids.  Prune juice may be helpful.  You may use a stool softener, such as Colace (over the counter) 100 mg twice a day.  Use MiraLax (over the counter) for constipation as needed.     Diet - low sodium heart healthy    Complete by:  As directed      Discharge instructions    Complete by:  As directed   INSTRUCTIONS AFTER JOINT REPLACEMENT   Remove items at home which could result in a fall. This includes throw rugs or furniture in walking pathways ICE to the affected joint every three hours while awake for 30 minutes at a time, for at least the first 3-5 days, and then as needed for pain and swelling.  Continue to use ice for pain and swelling. You may  notice swelling that will progress down to the foot and ankle.  This is normal after surgery.  Elevate your leg when you are not up walking on it.   Continue to use the breathing machine you got in the hospital (incentive spirometer) which will help keep your temperature down.  It is common for your temperature to cycle up and down following surgery, especially at night when you are not up moving around and  exerting yourself.  The breathing machine keeps your lungs expanded and your temperature down.   DIET:  As you were doing prior to hospitalization, we recommend a well-balanced diet.  DRESSING / WOUND CARE / SHOWERING  You may shower 3 days after surgery, but keep the wounds dry during showering.  You may use an occlusive plastic wrap (Press'n Seal for example), NO SOAKING/SUBMERGING IN THE BATHTUB.  If the bandage gets wet, change with a clean dry gauze.  If the incision gets wet, pat the wound dry with a clean towel.  ACTIVITY  Increase activity slowly as tolerated, but follow the weight bearing instructions below.   No driving for 6 weeks or until further direction given by your physician.  You cannot drive while taking narcotics.  No lifting or carrying greater than 10 lbs. until further directed by your surgeon. Avoid periods of inactivity such as sitting longer than an hour when not asleep. This helps prevent blood clots.  You may return to work once you are authorized by your doctor.     WEIGHT BEARING   Weight bearing as tolerated with assist device (walker, cane, etc) as directed, use it as long as suggested by your surgeon or therapist, typically at least 4-6 weeks.   EXERCISES  Results after joint replacement surgery are often greatly improved when you follow the exercise, range of motion and muscle strengthening exercises prescribed by your doctor. Safety measures are also important to protect the joint from further injury. Any time any of these exercises cause you to have  increased pain or swelling, decrease what you are doing until you are comfortable again and then slowly increase them. If you have problems or questions, call your caregiver or physical therapist for advice.   Rehabilitation is important following a joint replacement. After just a few days of immobilization, the muscles of the leg can become weakened and shrink (atrophy).  These exercises are designed to build up the tone and strength of the thigh and leg muscles and to improve motion. Often times heat used for twenty to thirty minutes before working out will loosen up your tissues and help with improving the range of motion but do not use heat for the first two weeks following surgery (sometimes heat can increase post-operative swelling).   These exercises can be done on a training (exercise) mat, on the floor, on a table or on a bed. Use whatever works the best and is most comfortable for you.    Use music or television while you are exercising so that the exercises are a pleasant break in your day. This will make your life better with the exercises acting as a break in your routine that you can look forward to.   Perform all exercises about fifteen times, three times per day or as directed.  You should exercise both the operative leg and the other leg as well.   Exercises include:   Quad Sets - Tighten up the muscle on the front of the thigh (Quad) and hold for 5-10 seconds.   Straight Leg Raises - With your knee straight (if you were given a brace, keep it on), lift the leg to 60 degrees, hold for 3 seconds, and slowly lower the leg.  Perform this exercise against resistance later as your leg gets stronger.  Leg Slides: Lying on your back, slowly slide your foot toward your buttocks, bending your knee up off the floor (only go as far as is comfortable). Then slowly slide  your foot back down until your leg is flat on the floor again.  Angel Wings: Lying on your back spread your legs to the side as far  apart as you can without causing discomfort.  Hamstring Strength:  Lying on your back, push your heel against the floor with your leg straight by tightening up the muscles of your buttocks.  Repeat, but this time bend your knee to a comfortable angle, and push your heel against the floor.  You may put a pillow under the heel to make it more comfortable if necessary.   A rehabilitation program following joint replacement surgery can speed recovery and prevent re-injury in the future due to weakened muscles. Contact your doctor or a physical therapist for more information on knee rehabilitation.    CONSTIPATION  Constipation is defined medically as fewer than three stools per week and severe constipation as less than one stool per week.  Even if you have a regular bowel pattern at home, your normal regimen is likely to be disrupted due to multiple reasons following surgery.  Combination of anesthesia, postoperative narcotics, change in appetite and fluid intake all can affect your bowels.   YOU MUST use at least one of the following options; they are listed in order of increasing strength to get the job done.  They are all available over the counter, and you may need to use some, POSSIBLY even all of these options:    Drink plenty of fluids (prune juice may be helpful) and high fiber foods Colace 100 mg by mouth twice a day  Senokot for constipation as directed and as needed Dulcolax (bisacodyl), take with full glass of water  Miralax (polyethylene glycol) once or twice a day as needed.  If you have tried all these things and are unable to have a bowel movement in the first 3-4 days after surgery call either your surgeon or your primary doctor.    If you experience loose stools or diarrhea, hold the medications until you stool forms back up.  If your symptoms do not get better within 1 week or if they get worse, check with your doctor.  If you experience "the worst abdominal pain ever" or develop  nausea or vomiting, please contact the office immediately for further recommendations for treatment.   ITCHING:  If you experience itching with your medications, try taking only a single pain pill, or even half a pain pill at a time.  You can also use Benadryl over the counter for itching or also to help with sleep.   TED HOSE STOCKINGS:  Use stockings on both legs until for at least 2 weeks or as directed by physician office. They may be removed at night for sleeping.  MEDICATIONS:  See your medication summary on the "After Visit Summary" that nursing will review with you.  You may have some home medications which will be placed on hold until you complete the course of blood thinner medication.  It is important for you to complete the blood thinner medication as prescribed.  PRECAUTIONS:  If you experience chest pain or shortness of breath - call 911 immediately for transfer to the hospital emergency department.   If you develop a fever greater that 101 F, purulent drainage from wound, increased redness or drainage from wound, foul odor from the wound/dressing, or calf pain - CONTACT YOUR SURGEON.  FOLLOW-UP APPOINTMENTS:  If you do not already have a post-op appointment, please call the office for an appointment to be seen by your surgeon.  Guidelines for how soon to be seen are listed in your "After Visit Summary", but are typically between 1-4 weeks after surgery.  OTHER INSTRUCTIONS:   Knee Replacement:  Do not place pillow under knee, focus on keeping the knee straight while resting. CPM instructions: 0-90 degrees, 2 hours in the morning, 2 hours in the afternoon, and 2 hours in the evening. Place foam block, curve side up under heel at all times except when in CPM or when walking.  DO NOT modify, tear, cut, or change the foam block in any way.  MAKE SURE YOU:  Understand these instructions.  Get help right away if you are not doing well or  get worse.    Thank you for letting us be a part of your medical care team.  It is a privilege we respect greatly.  We hope these instructions will help you stay on track for a fast and full recovery!     Increase activity slowly as tolerated    Complete by:  As directed            Follow-up Information    Follow up with Hessie Dibble, MD. Schedule an appointment as soon as possible for a visit in 2 weeks.   Specialty:  Orthopedic Surgery   Contact information:   [place] [id number] [phone]        Signed: DAMAURI, [name] 09/23/2015, 8:15 AM

## 2015-09-23 NOTE — Progress Notes (Signed)
Physical Therapy Treatment Patient Details Name: [name] MRN: [id number] DOB: Nov 01, 1926 Today's Date: 09/23/2015    History of Present Illness Pt is a 82 y/o F s/p Lt TKA.  Pt's PMH includes anemia, acute respiratory failure, sepsis, chest pain, Rt TKA, atrial flutter.     PT Comments    Patient demonstrated improved mobility from previous session and carry over of safe transfer techniques.Pt is self limiting at times due to anticipated increase in pain from mobility and needs encouragement for OOB mobility.  Current plan remains appropriate.   Follow Up Recommendations  Supervision/Assistance - 24 hour;SNF     Equipment Recommendations  3in1 (PT)    Recommendations for Other Services OT consult     Precautions / Restrictions Precautions Precautions: Fall;Knee Precaution Booklet Issued: Yes (comment) Precaution Comments: Reviewed knee precautions w/ pt Required Braces or Orthoses: Knee Immobilizer - Left Knee Immobilizer - Left: Other (comment) (not specified; no need for KI this session) Restrictions Weight Bearing Restrictions: Yes LLE Weight Bearing: Weight bearing as tolerated    Mobility  Bed Mobility Overal bed mobility: Needs Assistance Bed Mobility: Supine to Sit     Supine to sit: Min guard     General bed mobility comments: use of bed rails with HOB flat; increased time with cues for sequencing  Transfers Overall transfer level: Needs assistance Equipment used: Rolling walker (2 wheeled) Transfers: Sit to/from Stand Sit to Stand: Min guard;Min assist         General transfer comment: carry over of hand placement and technique; min guard for safety  Ambulation/Gait Ambulation/Gait assistance: Min guard Ambulation Distance (Feet): 40 Feet Assistive device: Rolling walker (2 wheeled) Gait Pattern/deviations: Step-to pattern;Step-through pattern;Decreased step length - right;Decreased stance time - left;Decreased dorsiflexion - right;Decreased  dorsiflexion - left;Trunk flexed     General Gait Details: vc for posture and position of RW; pt refused ambulating further and demonstrated fear of increased pain due to mobility;    Stairs            Wheelchair Mobility    Modified Rankin (Stroke Patients Only)       Balance Overall balance assessment: Needs assistance Sitting-balance support: No upper extremity supported Sitting balance-Leahy Scale: Good     Standing balance support: Bilateral upper extremity supported Standing balance-Leahy Scale: Fair                      Engineer, manufacturing systems Sets: AROM;10 reps;Supine;Left Heel Slides: AROM;Left;10 reps;Supine Straight Leg Raises: AROM;Left;5 reps;Supine Goniometric ROM: 3-85    General Comments        Pertinent Vitals/Pain Pain Assessment: 0-10 Pain Score: 5  Pain Location: L knee Pain Descriptors / Indicators: Sore Pain Intervention(s): Limited activity within patient's tolerance;Monitored during session;Premedicated before session;Repositioned    Home Living                      Prior Function            PT Goals (current goals can now be found in the care plan section) Acute Rehab PT Goals Patient Stated Goal: to be able to plant tomatoes in his garden PT Goal Formulation: With patient Time For Goal Achievement: 09/27/15 Potential to Achieve Goals: Good Progress towards PT goals: Progressing toward goals  Frequency  7X/week    PT Plan Current plan remains appropriate    Co-evaluation             End of Session Equipment Utilized During Treatment: Gait belt Activity Tolerance: Patient limited by pain Patient left: in chair;with call bell/phone within reach     Time: [id number] PT Time Calculation (min) (ACUTE ONLY): 33 min  Charges:  $Gait Training: 8-22 mins $Therapeutic Exercise: 8-22 mins                    G Codes:      Salina April, PTA Pager: [phone]   09/23/2015, 1:21 PM

## 2015-09-24 ENCOUNTER — Non-Acute Institutional Stay (SKILLED_NURSING_FACILITY): Payer: Medicare PPO | Admitting: Internal Medicine

## 2015-09-24 DIAGNOSIS — Z96652 Presence of left artificial knee joint: Secondary | ICD-10-CM

## 2015-09-24 DIAGNOSIS — N189 Chronic kidney disease, unspecified: Secondary | ICD-10-CM | POA: Diagnosis not present

## 2015-09-24 DIAGNOSIS — I482 Chronic atrial fibrillation, unspecified: Secondary | ICD-10-CM

## 2015-09-24 DIAGNOSIS — G2581 Restless legs syndrome: Secondary | ICD-10-CM

## 2015-09-26 ENCOUNTER — Other Ambulatory Visit: Payer: Self-pay | Admitting: *Deleted

## 2015-09-26 MED ORDER — TEMAZEPAM 7.5 MG PO CAPS: 7.5000 mg | ORAL_CAPSULE | Freq: Every evening | ORAL | Status: DC | PRN

## 2015-09-26 NOTE — Telephone Encounter (Signed)
[facility] 

## 2015-09-28 ENCOUNTER — Encounter (HOSPITAL_COMMUNITY)
Admission: RE | Admit: 2015-09-28 | Discharge: 2015-09-28 | Disposition: A | Discharge status: Home / Self Care | Payer: Medicare PPO | Source: Skilled Nursing Facility | Attending: Internal Medicine | Admitting: Internal Medicine

## 2015-09-28 ENCOUNTER — Encounter: Payer: Self-pay | Admitting: Internal Medicine

## 2015-09-28 ENCOUNTER — Non-Acute Institutional Stay: Payer: Medicare PPO | Admitting: Internal Medicine

## 2015-09-28 DIAGNOSIS — N189 Chronic kidney disease, unspecified: Secondary | ICD-10-CM

## 2015-09-28 DIAGNOSIS — E785 Hyperlipidemia, unspecified: Secondary | ICD-10-CM | POA: Diagnosis not present

## 2015-09-28 DIAGNOSIS — Z7901 Long term (current) use of anticoagulants: Secondary | ICD-10-CM | POA: Insufficient documentation

## 2015-09-28 DIAGNOSIS — M1712 Unilateral primary osteoarthritis, left knee: Secondary | ICD-10-CM | POA: Diagnosis not present

## 2015-09-28 DIAGNOSIS — D631 Anemia in chronic kidney disease: Secondary | ICD-10-CM

## 2015-09-28 DIAGNOSIS — I4892 Unspecified atrial flutter: Secondary | ICD-10-CM

## 2015-09-28 LAB — BASIC METABOLIC PANEL
Anion gap: 8 (ref 5–15)
BUN: 31 mg/dL — AB (ref 6–20)
CHLORIDE: 102 mmol/L (ref 101–111)
CO2: 27 mmol/L (ref 22–32)
CREATININE: 1.61 mg/dL — AB (ref 0.61–1.24)
Calcium: 9.6 mg/dL (ref 8.9–10.3)
GFR calc Af Amer: 43 mL/min — ABNORMAL LOW (ref >60)
GFR calc non Af Amer: 37 mL/min — ABNORMAL LOW (ref >60)
GLUCOSE: 90 mg/dL (ref 65–99)
Potassium: 4.1 mmol/L (ref 3.5–5.1)
Sodium: 137 mmol/L (ref 135–145)

## 2015-09-28 LAB — CBC
HCT: 30.9 % — ABNORMAL LOW (ref 39.0–52.0)
HEMOGLOBIN: 10.4 g/dL — AB (ref 13.0–17.0)
MCH: 32.1 pg (ref 26.0–34.0)
MCHC: 33.7 g/dL (ref 30.0–36.0)
MCV: 95.4 fL (ref 78.0–100.0)
PLATELETS: 401 10*3/uL — AB (ref 150–400)
RBC: 3.24 MIL/uL — ABNORMAL LOW (ref 4.22–5.81)
RDW: 13.1 % (ref 11.5–15.5)
WBC: 6.8 10*3/uL (ref 4.0–10.5)

## 2015-09-28 NOTE — Progress Notes (Addendum)
Patient ID: [name], male   DOB: 1927/05/21, 82 y.o.   MRN: [id number]                HISTORY & PHYSICAL  DATE:  09/24/2015        FACILITY: [facility]                      LEVEL OF CARE:   SNF   CHIEF COMPLAINT:  Admission to SNF, post stay at [facility], 09/20/2015 through 09/23/2015.    HISTORY OF PRESENT ILLNESS:  This patient was admitted electively for primary osteoarthritis of the left knee for a left total knee arthroplasty.  He seems to have tolerated this well.    This is a patient who was here in August.  He had cholangitis and sepsis secondary to E.coli, acute renal failure at the time, chronic atrial fibrillation.  He was discharged in early September.    He is followed by Dr. Percival Spanish of Cardiology for his paroxysmal atrial fibrillation which I think onsetted in August, as well.     PAST MEDICAL HISTORY/PROBLEM LIST:   Past Medical History  Diagnosis Date  . Prostate cancer ([place])   . GERD (gastroesophageal reflux disease)     Large HH with intrathoracic component.   . Hypertension   . Hyperlipemia   . Arthritis   . Inguinal hernia     left  . Coronary artery disease   . Dysrhythmia   . Pneumonia   . H/O hiatal hernia   . Choledocholithiasis   . Hypothyroidism   . Atrial fibrillation and flutter ([place])   . CKD (chronic kidney disease) 08/2013    stage 3    PAST SURGICAL HISTORY:   Past Surgical History  Procedure Laterality Date  . Hernia repair    . Prostate surgery    . Colonoscopy  2003, 01/2012    non-bleeding cecal AVMs, descending and sigmoid tics.   . Coronary artery bypass graft N/A 09/03/2013    Procedure: CORONARY ARTERY BYPASS GRAFTING times four using left internal mammary and bilateral saphenous vein.;  Surgeon: Ivin Poot, MD;  Location: [facility];  Service: Open Heart Surgery;  Laterality: N/A;  . Intraoperative transesophageal echocardiogram N/A 09/03/2013    Procedure: INTRAOPERATIVE TRANSESOPHAGEAL ECHOCARDIOGRAM;   Surgeon: Ivin Poot, MD;  Location: [facility];  Service: Open Heart Surgery;  Laterality: N/A;  . Coronary angioplasty    . Cataract extraction Bilateral   . Total knee arthroplasty Right 03/11/2014    Procedure: TOTAL KNEE ARTHROPLASTY;  Surgeon: Hessie Dibble, MD;  Location: [facility];  Service: Orthopedics;  Laterality: Right;  . Left heart catheterization with coronary angiogram N/A 08/03/2013    Procedure: LEFT HEART CATHETERIZATION WITH CORONARY ANGIOGRAM;  Surgeon: Blane Ohara, MD;  Location: [facility] CATH LAB;  Service: Cardiovascular;  Laterality: N/A;  . Right thoracotomy  As a young child, ? 21 years old    for pneumonia   . Esophagogastroduodenoscopy  01/2012    11 cm sliding hiatal hernia.  . Ercp N/A 04/04/2015    Procedure: ENDOSCOPIC RETROGRADE CHOLANGIOPANCREATOGRAPHY (ERCP);  Surgeon: Inda Castle, MD;  Location: [facility];  Service: Endoscopy;  Laterality: N/A;  . Ercp N/A 04/06/2015    Procedure: ENDOSCOPIC RETROGRADE CHOLANGIOPANCREATOGRAPHY (ERCP);  Surgeon: Inda Castle, MD;  Location: [facility];  Service: Endoscopy;  Laterality: N/A;  . Eye surgery      cataracts removed, ? IOL  . Total  knee arthroplasty Left 09/20/2015    Procedure: TOTAL KNEE ARTHROPLASTY;  Surgeon: Melrose Nakayama, MD;  Location: [facility];  Service: Orthopedics;  Laterality: Left;     CURRENT MEDICATIONS:  Current Outpatient Prescriptions on File Prior to Visit  Medication Sig Dispense Refill  . apixaban (ELIQUIS) 2.5 MG TABS tablet Take 1 tablet (2.5 mg total) by mouth 2 (two) times daily. 180 tablet 3  . Cholecalciferol (VITAMIN D3) 1000 UNITS CAPS Take 1,000 Units by mouth daily.     Marland Kitchen diltiazem (DILTIAZEM CD) 240 MG 24 hr capsule Take 1 capsule (240 mg total) by mouth daily. 90 capsule 3  . furosemide (LASIX) 40 MG tablet Take 1 tablet (40 mg total) by mouth daily. 90 tablet 3  . levothyroxine (SYNTHROID, LEVOTHROID) 25 MCG tablet Take 1 tablet (25 mcg total) by mouth daily before  breakfast. 90 tablet 3  . methocarbamol (ROBAXIN) 500 MG tablet Take 1 tablet (500 mg total) by mouth every 6 (six) hours as needed for muscle spasms. 50 tablet 0  . omeprazole (PRILOSEC) 40 MG capsule Take 1 capsule (40 mg total) by mouth daily. 90 capsule 3  . oxybutynin (DITROPAN) 5 MG tablet Take 1 tablet (5 mg total) by mouth daily. (Patient taking differently: Take 5 mg by mouth daily after breakfast. ) 90 tablet 3  . pramipexole (MIRAPEX) 1 MG tablet Take 1 tablet (1 mg total) by mouth at bedtime. 90 tablet 3  . pravastatin (PRAVACHOL) 40 MG tablet Take 1 tablet (40 mg total) by mouth daily. 90 tablet 3   REVIEW OF SYSTEMS:       CHEST/RESPIRATORY:  No clear cough or shortness of breath.   CARDIAC:  No chest pain.   No palpitations.   GI:  No constipation.  No abdominal pain GU:  No dysuria.    MUSCULOSKELETAL:  States he has some discomfort in the left knee, although his pain medications are effective.    PHYSICAL EXAMINATION:   GENERAL APPEARANCE:  The patient is not in any distress.      CHEST/RESPIRATORY:  Clear air entry bilaterally.     CARDIOVASCULAR:   CARDIAC:  Heart sounds are normal.  There are no gallops.  He appears to be euvolemic.  He has a 2/6 short midsystolic murmur at the lower left sternal border.  No radiation.     GASTROINTESTINAL:   ABDOMEN:  Soft.     LIVER/SPLEEN/KIDNEY:   No liver, no spleen.  No tenderness.    GENITOURINARY:   BLADDER:  Not enlarged.   MUSCULOSKELETAL:    EXTREMITIES:   LEFT LOWER EXTREMITY:  He has some edema in the left leg and some erythema just around the dressing on his knee.  I did not remove this.  This will need to be followed.  I suspect the edema is blood loss at the time of surgery.    ASSESSMENT/PLAN:                      Left total knee replacement.  This seems stable.  He is on Eliquis 2.5 b.i.d.  He left the hospital with a hemoglobin of 12.5.    Chronic renal failure.  He left the hospital with a creatinine of 1.67 and  an estimated GFR of 35.  This is an improvement from when he was here in August and September.    Restless legs syndrome.  On Mirapex.    Hyperlipidemia.  On Pravachol.    Hypertension.  We  will monitor this while he is here.    On Lasix 40 mg a day.  He is currently euvolemic.  Echocardiogram done in August showed a normal ejection fraction of 55-60%.  He seems currently asymptomatic.  We will follow up on his lab work later this week.    Chronic Atrial Fibrillation Hr is controlled on eliquia

## 2015-09-28 NOTE — Progress Notes (Signed)
Patient ID: [name], male   DOB: 1927-01-27, 82 y.o.   MRN: [id number] P             This is a discharge note      FACILITY: [facility]:   SNF   CHIEF COMPLAINT:  Discharge note.    HISTORY OF PRESENT ILLNESS:  This patient was admitted electively for primary osteoarthritis of the left knee for a left total knee arthroplasty.  He seems to have tolerated this well.    This is a patient who was here in August.  He had cholangitis and sepsis secondary to E.coli, acute renal failure at the time, chronic atrial fibrillation.  He was discharged in early September.    He is followed by Dr. Percival Spanish of Cardiology for his paroxysmal atrial fibrillation which I think onsetted in August, as well.    His stay here has been relatively unremarkable he will need continued PT and OT for strengthening as well as CPM machine-he continues on Eliquis for anticoagulation with history of A. fib   PAST MEDICAL HISTORY/PROBLEM LIST:   Reviewed.   --Includes history of prostate carcinoma-anemia-hyperlipidemia-restless leg.  Hypertension-coronary artery disease-GERD-chronic kidney disease-osteoarthritis-atrial fibrillation-   PAST SURGICAL HISTORY:   Reviewed.      CURRENT MEDICATIONS:  Medication list is reviewed Diltiazem 1240 mg daily.  Eliquist 2.5 mg twice a day.   Vicodin 5-3 25 mg 2 tabs when necessary every 4 hours.  Lasix 40 mg daily.  Synthroid 25 g daily.  Robaxin 500 mg every 6 hours when necessary.  Prilosec 40 mg daily.  Oxybutynin 5 mg daily.  Pramipexole 1 mg daily.  Pravastatin 40 mg daily.  Vitamin D 1000 units daily.  Restoril 7.5 mg daily at bedtime when necessary.     REVIEW OF SYSTEMS:    Normal no complaints of fever or chills.  Skin does not complain of rashes or itching.  Head ears eyes nose mouth and throat does not complaining of any nasal discharge or visual changes    CHEST/RESPIRATORY:  No clear  cough or shortness of breath.   CARDIAC:  No chest pain.   No palpitations.   GI:  No constipation.   GU:  No dysuria.    MUSCULOSKELETAL:  States he has some discomfort in the left knee, although his pain medications are effective.  Neuro logic does not complain of any headache or numbness.  Psych does not complain of depression or anxiety   PHYSICAL EXAMINATION: Temperature 97.7 pulse 87 respirations 20 blood pressure 128/65   GENERAL APPEARANCE:  The patient is not in any distress Skin is warm and dry there's covering of the left knee surgical site Eyes pupils are equal round reactive light sclerae and conjunctivae clear visual acuity appears clinically intact.  Oropharynx clear mucous membranes moist.  .      CHEST/RESPIRATORY:  Clear air entry bilaterally.     CARDIOVASCULAR:   CARDIAC:  Heart sounds are normal. With occasional irregular beat  There are no gallops.  He appears to be euvolemic.  He has a 2/6 short midsystolic murmur at the lower left sternal border.  No radiation is mild left leg edema was slightly reduced pedal pulse there is no tenderness or erythema.     GASTROINTESTINAL:   ABDOMEN:  Soft. Positive bowel sounds     LIVER/SPLEEN/KIDNEY:  No liver, no spleen.  No tenderness.       MUSCULOSKELETAL:    EXTREMITIES:   LEFT LOWER EXTREMITY:  He has some edema in the left leg As noted above-he is up walking with a walker appears to be doing well in this regards.  Neurologic is grossly intact no lateralizing findings speech is clear.  Psych he is alert and oriented pleasant and appropriate.  Labs.  09/28/2015.  Sodium 137 potassium 4.1 BUN 31 creatinine 1.61.  WBC 6.8 hemoglobin 10.4 platelets 401.  .    ASSESSMENT/PLAN:                      Left total knee replacement.  This seems stable.  He is on Eliquis 2.5 b.i.d. hemoglobin 10.4 appears to be comparable to what his hemoglobin was back in September however it was 12.5 when he left the  hospital-follow up with primary care provider as he necessary  Chronic renal failure.  He left the hospital with a creatinine of 1.67 and an estimated GFR of 35.  This is an improvement from when he was here in August and September.  Recent creatinine 1.61 BUN of 31 on lab done today  Restless legs syndrome.  On Mirapex.    Hyperlipidemia.  On Pravachol.    Hypertension.  He is on Cardizem 240 mg a day this appears to be stable blood pressure 120/65-133/64 most recently  On Lasix 40 mg a day.  He is currently euvolemic.  Echocardiogram done in August showed a normal ejection fraction of 55-60%.  He seems currently asymptomatic.      Atrial fibrillation this appears rate controlled he is on Cardizem as well as  Eliquis for anticoagulation this appears to be stable.    History of hypothyroidism is on supplementation Will defer follow-up to primary care provider since his stay here was quite short    Again patient will need continued PT and OT for strengthening status post left knee replacement as well as CPM machine.    He is getting Vicodin as needed for pain control apparently this is effective-.    [id number] note greater than 30 minutes spent on this discharge summary-greater than 50% of time spent coordinating plan of care for numerous diagnoses     CPT CODE: 03474

## 2015-09-29 ENCOUNTER — Other Ambulatory Visit: Payer: Self-pay | Admitting: *Deleted

## 2015-09-29 MED ORDER — HYDROCODONE-ACETAMINOPHEN 5-325 MG PO TABS: 1.0000 | ORAL_TABLET | ORAL | Status: DC | PRN

## 2015-09-30 ENCOUNTER — Ambulatory Visit: Payer: Medicare PPO | Admitting: Family Medicine

## 2015-10-03 DIAGNOSIS — N189 Chronic kidney disease, unspecified: Secondary | ICD-10-CM | POA: Diagnosis not present

## 2015-10-03 DIAGNOSIS — Z96652 Presence of left artificial knee joint: Secondary | ICD-10-CM | POA: Diagnosis not present

## 2015-10-03 DIAGNOSIS — I130 Hypertensive heart and chronic kidney disease with heart failure and stage 1 through stage 4 chronic kidney disease, or unspecified chronic kidney disease: Secondary | ICD-10-CM | POA: Diagnosis not present

## 2015-10-03 DIAGNOSIS — Z471 Aftercare following joint replacement surgery: Secondary | ICD-10-CM | POA: Diagnosis not present

## 2016-01-06 ENCOUNTER — Ambulatory Visit (INDEPENDENT_AMBULATORY_CARE_PROVIDER_SITE_OTHER): Payer: Medicare PPO | Admitting: Family Medicine

## 2016-01-06 ENCOUNTER — Encounter: Payer: Self-pay | Admitting: Family Medicine

## 2016-01-06 VITALS — BP 112/71 | HR 88 | Temp 97.6°F | Ht 71.0 in | Wt 160.0 lb

## 2016-01-06 DIAGNOSIS — E785 Hyperlipidemia, unspecified: Secondary | ICD-10-CM | POA: Diagnosis not present

## 2016-01-06 DIAGNOSIS — I10 Essential (primary) hypertension: Secondary | ICD-10-CM

## 2016-01-06 DIAGNOSIS — C61 Malignant neoplasm of prostate: Secondary | ICD-10-CM | POA: Diagnosis not present

## 2016-01-06 DIAGNOSIS — I4891 Unspecified atrial fibrillation: Secondary | ICD-10-CM | POA: Diagnosis not present

## 2016-01-06 DIAGNOSIS — E291 Testicular hypofunction: Secondary | ICD-10-CM

## 2016-01-06 DIAGNOSIS — E349 Endocrine disorder, unspecified: Secondary | ICD-10-CM

## 2016-01-06 DIAGNOSIS — M109 Gout, unspecified: Secondary | ICD-10-CM

## 2016-01-06 DIAGNOSIS — E559 Vitamin D deficiency, unspecified: Secondary | ICD-10-CM | POA: Diagnosis not present

## 2016-01-06 DIAGNOSIS — Z951 Presence of aortocoronary bypass graft: Secondary | ICD-10-CM

## 2016-01-06 MED ORDER — TEMAZEPAM 7.5 MG PO CAPS: 7.5000 mg | ORAL_CAPSULE | Freq: Every evening | ORAL | Status: DC | PRN

## 2016-01-06 NOTE — Progress Notes (Signed)
Subjective:    Patient ID: [name], male    DOB: 1927-04-23, 82 y.o.   MRN: [id number]  HPI Pt here for follow up and management of chronic medical problems which includes a fib, hypertension and  hyperlipidemia. He is taking medications regularly.The patient complains of some arthritic myalgias and his left toe and left knee. He wants a refill on his temazepam. He is due to return an FOBT and he will get a traditional lab panel today before he leaves office. The patient denies any chest pain or shortness of breath. Has to get up at nighttime to go the bathroom to pass his water 2 or 3 times nightly. He denies any burning or discomfort. His GI tract is stable with no nausea vomiting diarrhea blood in the stool or black tarry bowel movements. He is active enjoying his life and sating an example of how to live his life for son who has moved closer by.      Patient Active Problem List   Diagnosis Date Noted  . Primary osteoarthritis of left knee 09/20/2015  . Primary osteoarthritis of knee 09/20/2015  . Anemia 04/15/2015  . Anemia, chronic renal failure 04/15/2015  . Hypokalemia 04/15/2015  . Edema 04/12/2015  . Hyperbilirubinemia   . Choledocholithiasis 04/06/2015  . Encounter for nasogastric (NG) tube placement   . Hypoxia   . Abdominal pain, epigastric   . Abnormal liver enzymes   . Abnormal LFTs   . Biliary sepsis   . Acute respiratory failure ([place])   . Severe sepsis ([place])   . Bacteremia due to Gram-negative bacteria ([place])   . Hiatal hernia   . Sepsis ([place]) 03/28/2015  . Incisional hernia, without obstruction or gangrene 08/17/2014  . Atrial flutter ([place]) 07/28/2014  . Palpitation 06/16/2014  . Pain in the chest 06/10/2014  . Right knee DJD 03/11/2014  . S/P CABG x 4 09/03/2013  . Insomnia 07/27/2013  . Testosterone deficiency 07/27/2013  . Osteoarthritis of right knee 07/27/2013  . Hyperlipidemia 06/10/2013  . Heart murmur 06/10/2013  . Dyspnea 06/10/2013  .  Inguinal hernia, left. 04/04/2012  . Paraesophageal hiatal hernia 03/24/2012  . Prostate cancer ([place]) 01/15/2012  . Special screening for malignant neoplasms, colon 01/15/2012   Outpatient Encounter Prescriptions as of 01/06/2016  Medication Sig  . apixaban (ELIQUIS) 2.5 MG TABS tablet Take 1 tablet (2.5 mg total) by mouth 2 (two) times daily.  . Cholecalciferol (VITAMIN D3) 1000 UNITS CAPS Take 1,000 Units by mouth daily.   Marland Kitchen diltiazem (DILTIAZEM CD) 240 MG 24 hr capsule Take 1 capsule (240 mg total) by mouth daily.  . furosemide (LASIX) 40 MG tablet Take 1 tablet (40 mg total) by mouth daily.  Marland Kitchen HYDROcodone-acetaminophen (NORCO/VICODIN) 5-325 MG tablet Take 1-2 tablets by mouth every 4 (four) hours as needed (breakthrough pain).  Marland Kitchen levothyroxine (SYNTHROID, LEVOTHROID) 25 MCG tablet Take 1 tablet (25 mcg total) by mouth daily before breakfast.  . methocarbamol (ROBAXIN) 500 MG tablet Take 1 tablet (500 mg total) by mouth every 6 (six) hours as needed for muscle spasms.  Marland Kitchen omeprazole (PRILOSEC) 40 MG capsule Take 1 capsule (40 mg total) by mouth daily.  Marland Kitchen oxybutynin (DITROPAN) 5 MG tablet Take 1 tablet (5 mg total) by mouth daily. (Patient taking differently: Take 5 mg by mouth daily after breakfast. )  . pramipexole (MIRAPEX) 1 MG tablet Take 1 tablet (1 mg total) by mouth at bedtime.  . pravastatin (PRAVACHOL) 40 MG tablet Take 1 tablet (40  mg total) by mouth daily.  . temazepam (RESTORIL) 7.5 MG capsule Take 1 capsule (7.5 mg total) by mouth at bedtime as needed for sleep.   No facility-administered encounter medications on file as of 01/06/2016.     Review of Systems  Constitutional: Negative.   HENT: Negative.   Eyes: Negative.   Respiratory: Negative.   Cardiovascular: Negative.   Gastrointestinal: Negative.   Endocrine: Negative.   Genitourinary: Negative.   Musculoskeletal: Positive for arthralgias (left toe and left knee).  Skin: Negative.   Allergic/Immunologic: Negative.     Neurological: Negative.   Hematological: Negative.   Psychiatric/Behavioral: Negative.        Objective:   Physical Exam  Constitutional: He is oriented to person, place, and time. He appears well-developed and well-nourished. No distress.  Amazingly alert and in good shape  HENT:  Head: Normocephalic and atraumatic.  Right Ear: External ear normal.  Left Ear: External ear normal.  Nose: Nose normal.  Mouth/Throat: Oropharynx is clear and moist. No oropharyngeal exudate.  Eyes: Conjunctivae and EOM are normal. Pupils are equal, round, and reactive to light. Right eye exhibits no discharge. Left eye exhibits no discharge. No scleral icterus.  Neck: Normal range of motion. Neck supple. No tracheal deviation present. No thyromegaly present.  No bruits thyromegaly or anterior cervical adenopathy  Cardiovascular: Normal rate, regular rhythm, normal heart sounds and intact distal pulses.   No murmur heard. Heart is regular at 72/m  Pulmonary/Chest: Effort normal and breath sounds normal. No respiratory distress. He has no wheezes. He has no rales. He exhibits no tenderness.  No axillary adenopathy and clear anteriorly and posteriorly  Abdominal: Soft. Bowel sounds are normal. He exhibits no mass. There is no tenderness. There is no rebound and no guarding.  No liver or spleen enlargement and no abdominal tenderness and no bruits  Musculoskeletal: Normal range of motion. He exhibits tenderness. He exhibits no edema.  Slight tenderness and minimal swelling of left great toe  Lymphadenopathy:    He has no cervical adenopathy.  Neurological: He is alert and oriented to person, place, and time. He has normal reflexes. No cranial nerve deficit.  Skin: Skin is warm and dry. No rash noted.  Psychiatric: He has a normal mood and affect. His behavior is normal. Judgment and thought content normal.  Nursing note and vitals reviewed.  BP 112/71 mmHg  Pulse 88  Temp(Src) 97.6 F (36.4 C) (Oral)   Ht '5\' 11"'  (1.803 m)  Wt 160 lb (72.576 kg)  BMI 22.33 kg/m2        Assessment & Plan:  1. Essential hypertension -The blood pressure is good today and patient will continue with current treatment - BMP8+EGFR - CBC with Differential/Platelet - Hepatic function panel  2. Testosterone deficiency -The patient describes no fatigue associated with this and he is currently on no treatment for this. - CBC with Differential/Platelet  3. Hyperlipidemia -Continue current treatment pending results of lab work - CBC with Differential/Platelet - Lipid panel  4. Vitamin D deficiency -Continue current treatment pending results of lab work - CBC with Differential/Platelet - VITAMIN D 25 Hydroxy (Vit-D Deficiency, Fractures)  5. Atrial fibrillation, unspecified -The heart today had a regular rate and rhythm and the patient describes no palpitations or irregularities. - CBC with Differential/Platelet  6. Prostate cancer ([place]) - CBC with Differential/Platelet  7. Gout of left foot, unspecified cause, unspecified chronicity - Uric acid  8. Hyperbilirubinemia   Meds ordered this encounter  Medications  .  temazepam (RESTORIL) 7.5 MG capsule    Sig: Take 1 capsule (7.5 mg total) by mouth at bedtime as needed for sleep.    Dispense:  90 capsule    Refill:  0   Patient Instructions                       Medicare Annual Wellness Visit  [facility] and the medical providers at [place] strive to bring you the best medical care.  In doing so we not only want to address your current medical conditions and concerns but also to detect new conditions early and prevent illness, disease and health-related problems.    Medicare offers a yearly Wellness Visit which allows our clinical staff to assess your need for preventative services including immunizations, lifestyle education, counseling to decrease risk of preventable diseases and screening for fall risk and other  medical concerns.    This visit is provided free of charge (no copay) for all Medicare recipients. The clinical pharmacists at [facility] have begun to conduct these Wellness Visits which will also include a thorough review of all your medications.    As you primary medical provider recommend that you make an appointment for your Annual Wellness Visit if you have not done so already this year.  You may set up this appointment before you leave today or you may call back [phone]) and schedule an appointment.  Please make sure when you call that you mention that you are scheduling your Annual Wellness Visit with the clinical pharmacist so that the appointment may be made for the proper length of time.     Continue current medications. Continue good therapeutic lifestyle changes which include good diet and exercise. Fall precautions discussed with patient. If an FOBT was given today- please return it to our front desk. If you are over 8 years old - you may need Prevnar 53 or the adult Pneumonia vaccine.  **Flu shots are available--- please call and schedule a FLU-CLINIC appointment**  After your visit with [place] today you will receive a survey in the mail or online from [employer] regarding your care with [place]. Please take a moment to fill this out. Your feedback is very important to [place] as you can help [place] better understand your patient needs as well as improve your experience and satisfaction. WE CARE ABOUT YOU!!!   Follow-up with cardiology as planned Follow-up with orthopedics as planned [facility] call with lab work results as soon as these results become available Return the FOBT test   Arrie Senate MD   9. S/P CABG x 4 -Follow-up with cardiology as planned  Arrie Senate MD

## 2016-01-06 NOTE — Patient Instructions (Addendum)
Medicare Annual Wellness Visit  [facility] and the medical providers at [facility] strive to bring you the best medical care.  In doing so we not only want to address your current medical conditions and concerns but also to detect new conditions early and prevent illness, disease and health-related problems.    Medicare offers a yearly Wellness Visit which allows our clinical staff to assess your need for preventative services including immunizations, lifestyle education, counseling to decrease risk of preventable diseases and screening for fall risk and other medical concerns.    This visit is provided free of charge (no copay) for all Medicare recipients. The clinical pharmacists at [facility] have begun to conduct these Wellness Visits which will also include a thorough review of all your medications.    As you primary medical provider recommend that you make an appointment for your Annual Wellness Visit if you have not done so already this year.  You may set up this appointment before you leave today or you may call back [phone] and schedule an appointment.  Please make sure when you call that you mention that you are scheduling your Annual Wellness Visit with the clinical pharmacist so that the appointment may be made for the proper length of time.     Continue current medications. Continue good therapeutic lifestyle changes which include good diet and exercise. Fall precautions discussed with patient. If an FOBT was given today- please return it to our front desk. If you are over 6 years old - you may need Prevnar 76 or the adult Pneumonia vaccine.  **Flu shots are available--- please call and schedule a FLU-CLINIC appointment**  After your visit with [place] today you will receive a survey in the mail or online from [employer] regarding your care with [place]. Please take a moment to fill this out. Your feedback is very  important to [place] as you can help [place] better understand your patient needs as well as improve your experience and satisfaction. WE CARE ABOUT YOU!!!   Follow-up with cardiology as planned Follow-up with orthopedics as planned [facility] call with lab work results as soon as these results become available Return the FOBT test

## 2016-01-07 LAB — VITAMIN D 25 HYDROXY (VIT D DEFICIENCY, FRACTURES): Vit D, 25-Hydroxy: 57.8 ng/mL (ref 30.0–100.0)

## 2016-01-07 LAB — HEPATIC FUNCTION PANEL
ALT: 16 IU/L (ref 0–44)
AST: 26 IU/L (ref 0–40)
Albumin: 4.6 g/dL (ref 3.5–4.7)
Alkaline Phosphatase: 129 IU/L — ABNORMAL HIGH (ref 39–117)
Bilirubin Total: 0.4 mg/dL (ref 0.0–1.2)
Bilirubin, Direct: 0.12 mg/dL (ref 0.00–0.40)
Total Protein: 7.2 g/dL (ref 6.0–8.5)

## 2016-01-07 LAB — CBC WITH DIFFERENTIAL/PLATELET
BASOS: 0 %
Basophils Absolute: 0 10*3/uL (ref 0.0–0.2)
EOS (ABSOLUTE): 0.1 10*3/uL (ref 0.0–0.4)
EOS: 2 %
HEMATOCRIT: 35.9 % — AB (ref 37.5–51.0)
Hemoglobin: 11.8 g/dL — ABNORMAL LOW (ref 12.6–17.7)
IMMATURE GRANS (ABS): 0 10*3/uL (ref 0.0–0.1)
IMMATURE GRANULOCYTES: 0 %
LYMPHS: 13 %
Lymphocytes Absolute: 0.9 10*3/uL (ref 0.7–3.1)
MCH: 30.3 pg (ref 26.6–33.0)
MCHC: 32.9 g/dL (ref 31.5–35.7)
MCV: 92 fL (ref 79–97)
MONOCYTES: 11 %
Monocytes Absolute: 0.8 10*3/uL (ref 0.1–0.9)
NEUTROS PCT: 74 %
Neutrophils Absolute: 5.1 10*3/uL (ref 1.4–7.0)
PLATELETS: 297 10*3/uL (ref 150–379)
RBC: 3.89 x10E6/uL — ABNORMAL LOW (ref 4.14–5.80)
RDW: 14.3 % (ref 12.3–15.4)
WBC: 6.8 10*3/uL (ref 3.4–10.8)

## 2016-01-07 LAB — LIPID PANEL
CHOLESTEROL TOTAL: 126 mg/dL (ref 100–199)
Chol/HDL Ratio: 2.6 ratio units (ref 0.0–5.0)
HDL: 48 mg/dL (ref >39)
LDL Calculated: 54 mg/dL (ref 0–99)
Triglycerides: 122 mg/dL (ref 0–149)
VLDL CHOLESTEROL CAL: 24 mg/dL (ref 5–40)

## 2016-01-07 LAB — BMP8+EGFR
BUN/Creatinine Ratio: 15 (ref 10–24)
BUN: 25 mg/dL (ref 8–27)
CALCIUM: 10.3 mg/dL — AB (ref 8.6–10.2)
CO2: 25 mmol/L (ref 18–29)
CREATININE: 1.7 mg/dL — AB (ref 0.76–1.27)
Chloride: 102 mmol/L (ref 96–106)
GFR calc Af Amer: 41 mL/min/{1.73_m2} — ABNORMAL LOW (ref >59)
GFR, EST NON AFRICAN AMERICAN: 36 mL/min/{1.73_m2} — AB (ref >59)
Glucose: 95 mg/dL (ref 65–99)
Potassium: 3.8 mmol/L (ref 3.5–5.2)
Sodium: 143 mmol/L (ref 134–144)

## 2016-01-07 LAB — URIC ACID: Uric Acid: 8.4 mg/dL (ref 3.7–8.6)

## 2016-01-10 ENCOUNTER — Other Ambulatory Visit: Payer: Medicare PPO

## 2016-01-10 DIAGNOSIS — Z1211 Encounter for screening for malignant neoplasm of colon: Secondary | ICD-10-CM

## 2016-01-12 LAB — FECAL OCCULT BLOOD, IMMUNOCHEMICAL: Fecal Occult Bld: NEGATIVE

## 2016-01-17 ENCOUNTER — Other Ambulatory Visit: Payer: Self-pay | Admitting: Family Medicine

## 2016-01-24 ENCOUNTER — Telehealth: Payer: Self-pay | Admitting: *Deleted

## 2016-01-24 MED ORDER — TRAZODONE HCL 100 MG PO TABS: 50.0000 mg | ORAL_TABLET | Freq: Every evening | ORAL | Status: DC | PRN

## 2016-01-24 NOTE — Telephone Encounter (Signed)
Which med change would you prefer ?

## 2016-01-24 NOTE — Telephone Encounter (Signed)
Trazodone 100 mg that can be split into 50 mg which he can take at bedtime as needed

## 2016-01-24 NOTE — Telephone Encounter (Signed)
Temazepam 7.5mg  is not covered. The only 2 options are Temazepam 15mg  capsule that cannot be split or Trazadone 100mg  that can be split? Let pt know and sent to [facility]

## 2016-01-24 NOTE — Telephone Encounter (Signed)
Pt aware of med changes

## 2016-03-21 ENCOUNTER — Ambulatory Visit (INDEPENDENT_AMBULATORY_CARE_PROVIDER_SITE_OTHER): Payer: Medicare PPO | Admitting: Family Medicine

## 2016-03-21 ENCOUNTER — Encounter: Payer: Self-pay | Admitting: Family Medicine

## 2016-03-21 VITALS — BP 140/71 | HR 74 | Temp 97.1°F | Ht 71.0 in | Wt 159.2 lb

## 2016-03-21 DIAGNOSIS — A084 Viral intestinal infection, unspecified: Secondary | ICD-10-CM | POA: Diagnosis not present

## 2016-03-21 NOTE — Progress Notes (Addendum)
BP 140/71 (BP Location: Left Arm, Patient Position: Sitting, Cuff Size: Normal)   Pulse 74   Temp 97.1 F (36.2 C) (Oral)   Ht 5\' 11"  (1.803 m)   Wt 159 lb 3.2 oz (72.2 kg)   BMI 22.20 kg/m    Subjective:    Patient ID: [name], male    DOB: 1926-10-20, 82 y.o.   MRN: [id number]  HPI: [name] is a 82 y.o. male presenting on 03/21/2016 for Diarrhea (x 3 - 4 days; taking Pepto Bismol)   HPI Diarrhea Patient has been having diarrhea for the past 3 days. He has had 2-3 episodes of loose stools per day and thought it was resolving yesterday but was concerned because he still had a couple today. He initially had left lower quadrant abdominal pain associated with it but that has resolved. He denies any fevers or chills or blood in his stool. He denies any abdominal pain today but just the diarrhea is his main concern.  Relevant past medical, surgical, family and social history reviewed and updated as indicated. Interim medical history since our last visit reviewed. Allergies and medications reviewed and updated.  Review of Systems  Constitutional: Negative for fever.  HENT: Negative for ear discharge and ear pain.   Eyes: Negative for discharge and visual disturbance.  Respiratory: Negative for shortness of breath and wheezing.   Cardiovascular: Negative for chest pain and leg swelling.  Gastrointestinal: Positive for abdominal pain and diarrhea. Negative for blood in stool, constipation, nausea, rectal pain and vomiting.  Genitourinary: Negative for difficulty urinating.  Musculoskeletal: Negative for back pain and gait problem.  Skin: Negative for rash.  Neurological: Negative for syncope, light-headedness and headaches.  All other systems reviewed and are negative.   Per HPI unless specifically indicated above     Medication List       Accurate as of 03/21/16  9:20 AM. Always use your most recent med list.          apixaban 2.5 MG Tabs tablet Commonly known as:   ELIQUIS Take 1 tablet (2.5 mg total) by mouth 2 (two) times daily.   diltiazem 240 MG 24 hr capsule Commonly known as:  DILTIAZEM CD Take 1 capsule (240 mg total) by mouth daily.   furosemide 40 MG tablet Commonly known as:  LASIX Take 1 tablet (40 mg total) by mouth daily.   HYDROcodone-acetaminophen 5-325 MG tablet Commonly known as:  NORCO/VICODIN Take 1-2 tablets by mouth every 4 (four) hours as needed (breakthrough pain).   levothyroxine 25 MCG tablet Commonly known as:  SYNTHROID, LEVOTHROID Take 1 tablet (25 mcg total) by mouth daily before breakfast.   methocarbamol 500 MG tablet Commonly known as:  ROBAXIN Take 1 tablet (500 mg total) by mouth every 6 (six) hours as needed for muscle spasms.   omeprazole 40 MG capsule Commonly known as:  PRILOSEC Take 1 capsule (40 mg total) by mouth daily.   oxybutynin 5 MG tablet Commonly known as:  DITROPAN Take 1 tablet (5 mg total) by mouth daily.   pramipexole 1 MG tablet Commonly known as:  MIRAPEX TAKE 1 TABLET AT BEDTIME   pravastatin 40 MG tablet Commonly known as:  PRAVACHOL Take 1 tablet (40 mg total) by mouth daily.   temazepam 7.5 MG capsule Commonly known as:  RESTORIL Take 1 capsule (7.5 mg total) by mouth at bedtime as needed for sleep.   traZODone 100 MG tablet Commonly known as:  DESYREL Take 0.5  tablets (50 mg total) by mouth at bedtime as needed for sleep.   Vitamin D3 1000 units Caps Take 1,000 Units by mouth daily.          Objective:    BP 140/71 (BP Location: Left Arm, Patient Position: Sitting, Cuff Size: Normal)   Pulse 74   Temp 97.1 F (36.2 C) (Oral)   Ht 5\' 11"  (1.803 m)   Wt 159 lb 3.2 oz (72.2 kg)   BMI 22.20 kg/m   Wt Readings from Last 3 Encounters:  03/21/16 159 lb 3.2 oz (72.2 kg)  01/06/16 160 lb (72.6 kg)  09/20/15 167 lb 8 oz (76 kg)    Physical Exam  Constitutional: He is oriented to person, place, and time. He appears well-developed and well-nourished. No  distress.  Eyes: Conjunctivae and EOM are normal. Pupils are equal, round, and reactive to light. Right eye exhibits no discharge. No scleral icterus.  Neck: Neck supple. No thyromegaly present.  Cardiovascular: Normal rate, regular rhythm and intact distal pulses.   Murmur (Systolic murmur known to patient) heard. Pulmonary/Chest: Effort normal and breath sounds normal. No respiratory distress. He has no wheezes.  Abdominal: Soft. Bowel sounds are normal. He exhibits no distension. There is no tenderness. There is no rebound and no guarding.  Musculoskeletal: Normal range of motion. He exhibits no edema.  Lymphadenopathy:    He has no cervical adenopathy.  Neurological: He is alert and oriented to person, place, and time. Coordination normal.  Skin: Skin is warm and dry. No rash noted. He is not diaphoretic.  Psychiatric: He has a normal mood and affect. His behavior is normal.  Nursing note and vitals reviewed.     Assessment & Plan:   Problem List Items Addressed This Visit    None    Visit Diagnoses    Viral gastroenteritis    -  Primary   Use Imodium as needed, it sounds like it's already resolving, return if fevers develop or worsening       Follow up plan: Return if symptoms worsen or fail to improve.  Counseling provided for all of the vaccine components No orders of the defined types were placed in this encounter.   Caryl Pina, MD [facility] 03/21/2016, 9:20 AM

## 2016-04-06 ENCOUNTER — Ambulatory Visit (INDEPENDENT_AMBULATORY_CARE_PROVIDER_SITE_OTHER): Payer: Medicare PPO | Admitting: Family Medicine

## 2016-04-06 ENCOUNTER — Encounter: Payer: Self-pay | Admitting: Family Medicine

## 2016-04-06 VITALS — BP 119/70 | HR 93 | Temp 97.5°F | Ht 71.0 in | Wt 159.6 lb

## 2016-04-06 DIAGNOSIS — I4892 Unspecified atrial flutter: Secondary | ICD-10-CM

## 2016-04-06 NOTE — Progress Notes (Signed)
Subjective:    Patient ID: [name], male    DOB: 05-02-1927, 82 y.o.   MRN: [id number]  HPI 82 year old l who comes in complaining of swollen feet. He saw orthopedist recently with same complaints) has h He denies shortness of breath or chest pain. He does take Lasix 40 mg a day. ad bilateral knee replacements) and they told him it might be some of his medicines. We went over most of his medicines and I made some singe suggestions about things that were probably of limited value at his age but I'm not sure if he will follow through with stopping these medicines there were things like Ditropan Robaxin. He is also on trazodone as well as a benzo hypnotic as well as Mirapex at night combination of these 3 probably would benefit from some reductions Patient Active Problem List   Diagnosis Date Noted  . Primary osteoarthritis of left knee 09/20/2015  . Anemia, chronic renal failure 04/15/2015  . Edema 04/12/2015  . Hyperbilirubinemia   . Choledocholithiasis 04/06/2015  . Hypoxia   . Abdominal pain, epigastric   . Abnormal liver enzymes   . Abnormal LFTs   . Biliary sepsis   . Acute respiratory failure ([place])   . Severe sepsis ([place])   . Bacteremia due to Gram-negative bacteria ([place])   . Hiatal hernia   . Sepsis ([place]) 03/28/2015  . Incisional hernia, without obstruction or gangrene 08/17/2014  . Atrial flutter ([place]) 07/28/2014  . Palpitation 06/16/2014  . Right knee DJD 03/11/2014  . S/P CABG x 4 09/03/2013  . Insomnia 07/27/2013  . Testosterone deficiency 07/27/2013  . Hyperlipidemia 06/10/2013  . Heart murmur 06/10/2013  . Dyspnea 06/10/2013  . Inguinal hernia, left. 04/04/2012  . Paraesophageal hiatal hernia 03/24/2012  . Prostate cancer ([place]) 01/15/2012  . Special screening for malignant neoplasms, colon 01/15/2012   Outpatient Encounter Prescriptions as of 04/06/2016  Medication Sig  . apixaban (ELIQUIS) 2.5 MG TABS tablet Take 1 tablet (2.5 mg total) by mouth 2 (two) times  daily.  . Cholecalciferol (VITAMIN D3) 1000 UNITS CAPS Take 1,000 Units by mouth daily.   Marland Kitchen diltiazem (DILTIAZEM CD) 240 MG 24 hr capsule Take 1 capsule (240 mg total) by mouth daily.  . furosemide (LASIX) 40 MG tablet Take 1 tablet (40 mg total) by mouth daily.  Marland Kitchen HYDROcodone-acetaminophen (NORCO/VICODIN) 5-325 MG tablet Take 1-2 tablets by mouth every 4 (four) hours as needed (breakthrough pain).  Marland Kitchen levothyroxine (SYNTHROID, LEVOTHROID) 25 MCG tablet Take 1 tablet (25 mcg total) by mouth daily before breakfast.  . methocarbamol (ROBAXIN) 500 MG tablet Take 1 tablet (500 mg total) by mouth every 6 (six) hours as needed for muscle spasms.  Marland Kitchen omeprazole (PRILOSEC) 40 MG capsule Take 1 capsule (40 mg total) by mouth daily.  Marland Kitchen oxybutynin (DITROPAN) 5 MG tablet Take 1 tablet (5 mg total) by mouth daily. (Patient taking differently: Take 5 mg by mouth daily after breakfast. )  . pramipexole (MIRAPEX) 1 MG tablet TAKE 1 TABLET AT BEDTIME  . pravastatin (PRAVACHOL) 40 MG tablet Take 1 tablet (40 mg total) by mouth daily.  . temazepam (RESTORIL) 7.5 MG capsule Take 1 capsule (7.5 mg total) by mouth at bedtime as needed for sleep.  . traZODone (DESYREL) 100 MG tablet Take 0.5 tablets (50 mg total) by mouth at bedtime as needed for sleep.   No facility-administered encounter medications on file as of 04/06/2016.     Review of Systems  Constitutional: Positive for fatigue.  Respiratory: Negative.   Cardiovascular: Negative.   Genitourinary: Negative.   Neurological: Positive for numbness.       Objective:   Physical Exam  Constitutional: He appears well-developed and well-nourished.  Cardiovascular: Normal rate, regular rhythm and normal heart sounds.   Pulmonary/Chest: Effort normal and breath sounds normal.  Musculoskeletal: He exhibits edema (there is no detectable edema. I did a sensory exam with monofilament and it seems like there is some decreased sensation. He may have some neuropathy with  decreased sensation that makes him feel like there is some swelling).   BP 119/70 (BP Location: Right Arm, Patient Position: Sitting, Cuff Size: Normal)   Pulse 93   Temp 97.5 F (36.4 C) (Oral)   Ht 5\' 11"  (1.803 m)   Wt 159 lb 9.6 oz (72.4 kg)   BMI 22.26 kg/m         Assessment & Plan:  Patient presents with chief complaint of edema which is not really present. As stated above I think this is related to some decreased sensation possibly neuropathy. We did suggest some medicines to stop. I cautioned him about balance issues and falls if there is decreased sensation  Wardell Honour MD

## 2016-04-26 ENCOUNTER — Other Ambulatory Visit: Payer: Self-pay | Admitting: Family Medicine

## 2016-05-02 ENCOUNTER — Telehealth: Payer: Self-pay | Admitting: *Deleted

## 2016-05-02 NOTE — Telephone Encounter (Signed)
CALLED WANTING ELIQUIS SAMPLES, HE LIVES IN [place] AND WANTS TO GET THEM FROM THERE, PLEASE CALL HIM TO LET HIM KNOW IF HE CAN GET SOME, THANKS

## 2016-05-02 NOTE — Telephone Encounter (Signed)
Called to let pt know I do not have any samples here in the [place] office.  He has to notify me ahead of time so that I can bring them from the [place] office.  He was not available.  Will attempt to contact him again.

## 2016-05-03 NOTE — Telephone Encounter (Signed)
LM for pt on VM I have Eliquis 2.5 mg samples and I will be back in the [facility] office 05/09/2016.  Requested he c/b with questions or concerns.

## 2016-05-23 ENCOUNTER — Encounter: Payer: Self-pay | Admitting: Cardiology

## 2016-05-25 ENCOUNTER — Encounter: Payer: Self-pay | Admitting: Family Medicine

## 2016-05-25 ENCOUNTER — Ambulatory Visit (INDEPENDENT_AMBULATORY_CARE_PROVIDER_SITE_OTHER): Payer: Medicare PPO | Admitting: Family Medicine

## 2016-05-25 ENCOUNTER — Ambulatory Visit (INDEPENDENT_AMBULATORY_CARE_PROVIDER_SITE_OTHER): Payer: Medicare PPO

## 2016-05-25 VITALS — BP 149/72 | HR 65 | Temp 97.0°F | Ht 71.0 in | Wt 158.0 lb

## 2016-05-25 DIAGNOSIS — R200 Anesthesia of skin: Secondary | ICD-10-CM

## 2016-05-25 DIAGNOSIS — E559 Vitamin D deficiency, unspecified: Secondary | ICD-10-CM

## 2016-05-25 DIAGNOSIS — E349 Endocrine disorder, unspecified: Secondary | ICD-10-CM | POA: Diagnosis not present

## 2016-05-25 DIAGNOSIS — I700 Atherosclerosis of aorta: Secondary | ICD-10-CM | POA: Insufficient documentation

## 2016-05-25 DIAGNOSIS — I4892 Unspecified atrial flutter: Secondary | ICD-10-CM

## 2016-05-25 DIAGNOSIS — R202 Paresthesia of skin: Secondary | ICD-10-CM

## 2016-05-25 DIAGNOSIS — I10 Essential (primary) hypertension: Secondary | ICD-10-CM | POA: Diagnosis not present

## 2016-05-25 DIAGNOSIS — E7849 Other hyperlipidemia: Secondary | ICD-10-CM

## 2016-05-25 DIAGNOSIS — G629 Polyneuropathy, unspecified: Secondary | ICD-10-CM

## 2016-05-25 DIAGNOSIS — C61 Malignant neoplasm of prostate: Secondary | ICD-10-CM

## 2016-05-25 DIAGNOSIS — Z951 Presence of aortocoronary bypass graft: Secondary | ICD-10-CM

## 2016-05-25 DIAGNOSIS — Z23 Encounter for immunization: Secondary | ICD-10-CM | POA: Diagnosis not present

## 2016-05-25 DIAGNOSIS — E784 Other hyperlipidemia: Secondary | ICD-10-CM | POA: Diagnosis not present

## 2016-05-25 NOTE — Progress Notes (Signed)
Subjective:    Patient ID: [name], male    DOB: [birth year], 83 y.o.   MRN: [id number]  HPI Pt here for follow up and management of chronic medical problems which includes hyperlipidemia. He is taking medications regularly.The patient is complaining with some tightness or some kind of neuropathy in his feet and legs. The patient is pleasant and alert. He is had a CABG in the past. He says that ever since the CABG he has had some shortness of breath. No PND. He says his stomach hurts some time especially in the left lower quadrant and this is an off and on type thing most likely related to what he's had to eat. He denies any blood in the stool or black tarry bowel movements. He has had some loose bowel movements but only on an occasion. He goes to the bathroom frequently but does not have any burning and he has not reported any blood. He says that the trazodone does not help much with his sleeping but we told him we couldn't give him anything any stronger than that and suggested he try some melatonin 3 mg. This would be in addition to the trazodone. His biggest complaint today is tightness in his legs below the knees. He denies any back pain. He notices it most when he is moving his legs and it is worse if he is walking.  He is getting lab work today and we will make sure that we add a B12 level to his lab work.     Patient Active Problem List   Diagnosis Date Noted  . Primary osteoarthritis of left knee 09/20/2015  . Anemia, chronic renal failure 04/15/2015  . Edema 04/12/2015  . Hyperbilirubinemia   . Choledocholithiasis 04/06/2015  . Hypoxia   . Abdominal pain, epigastric   . Abnormal liver enzymes   . Abnormal LFTs   . Biliary sepsis   . Acute respiratory failure ([place])   . Severe sepsis ([place])   . Bacteremia due to Gram-negative bacteria   . Hiatal hernia   . Sepsis ([place]) 03/28/2015  . Incisional hernia, without obstruction or gangrene 08/17/2014  . Atrial flutter ([place]) 07/28/2014   . Palpitation 06/16/2014  . Right knee DJD 03/11/2014  . S/P CABG x 4 09/03/2013  . Insomnia 07/27/2013  . Testosterone deficiency 07/27/2013  . Hyperlipidemia 06/10/2013  . Heart murmur 06/10/2013  . Dyspnea 06/10/2013  . Inguinal hernia, left. 04/04/2012  . Paraesophageal hiatal hernia 03/24/2012  . Prostate cancer ([place]) 01/15/2012  . Special screening for malignant neoplasms, colon 01/15/2012   Outpatient Encounter Prescriptions as of 05/25/2016  Medication Sig  . apixaban (ELIQUIS) 2.5 MG TABS tablet Take 1 tablet (2.5 mg total) by mouth 2 (two) times daily.  Marland Kitchen CARTIA XT 240 MG 24 hr capsule TAKE 1 CAPSULE EVERY DAY  . Cholecalciferol (VITAMIN D3) 1000 UNITS CAPS Take 1,000 Units by mouth daily.   . furosemide (LASIX) 40 MG tablet TAKE 1 TABLET EVERY DAY  . levothyroxine (SYNTHROID, LEVOTHROID) 25 MCG tablet TAKE 1 TABLET EVERY DAY BEFORE BREAKFAST  . omeprazole (PRILOSEC) 40 MG capsule Take 1 capsule (40 mg total) by mouth daily.  Marland Kitchen oxybutynin (DITROPAN) 5 MG tablet Take 1 tablet (5 mg total) by mouth daily. (Patient taking differently: Take 5 mg by mouth daily after breakfast. )  . pramipexole (MIRAPEX) 1 MG tablet TAKE 1 TABLET AT BEDTIME  . pravastatin (PRAVACHOL) 40 MG tablet Take 1 tablet (40 mg total) by mouth daily.  Marland Kitchen  temazepam (RESTORIL) 7.5 MG capsule Take 1 capsule (7.5 mg total) by mouth at bedtime as needed for sleep.  . traZODone (DESYREL) 100 MG tablet Take 0.5 tablets (50 mg total) by mouth at bedtime as needed for sleep.  . [DISCONTINUED] HYDROcodone-acetaminophen (NORCO/VICODIN) 5-325 MG tablet Take 1-2 tablets by mouth every 4 (four) hours as needed (breakthrough pain).  . [DISCONTINUED] methocarbamol (ROBAXIN) 500 MG tablet Take 1 tablet (500 mg total) by mouth every 6 (six) hours as needed for muscle spasms.   No facility-administered encounter medications on file as of 05/25/2016.       Review of Systems  Constitutional: Negative.   HENT: Negative.     Eyes: Negative.   Respiratory: Negative.   Cardiovascular: Negative.   Gastrointestinal: Negative.   Endocrine: Negative.   Genitourinary: Negative.   Musculoskeletal: Negative.   Skin: Negative.   Allergic/Immunologic: Negative.   Neurological: Positive for numbness (and tightness of feet and lower legs.).  Hematological: Negative.   Psychiatric/Behavioral: Negative.        Objective:   Physical Exam  Constitutional: He is oriented to person, place, and time. He appears well-developed and well-nourished. No distress.  HENT:  Head: Normocephalic and atraumatic.  Right Ear: External ear normal.  Left Ear: External ear normal.  Nose: Nose normal.  Mouth/Throat: Oropharynx is clear and moist. No oropharyngeal exudate.  Eyes: Conjunctivae and EOM are normal. Pupils are equal, round, and reactive to light. Right eye exhibits no discharge. Left eye exhibits no discharge. No scleral icterus.  Neck: Normal range of motion. Neck supple. No thyromegaly present.  Bilateral carotid bruits most likely radiating from the heart. There are no lymph nodes and no thyroid enlargement  Cardiovascular: Normal rate, regular rhythm and intact distal pulses.   Murmur heard. The heart has a regular rate and rhythm at 72/m with a grade 3/6 systolic ejection murmur  Pulmonary/Chest: Effort normal and breath sounds normal. No respiratory distress. He has no wheezes. He has no rales. He exhibits no tenderness.  Clear anteriorly and posteriorly and no axillary adenopathy  Abdominal: Soft. Bowel sounds are normal. He exhibits no mass. There is no tenderness. There is no rebound and no guarding.  No abdominal tenderness noted no liver or spleen enlargement and no bruits and good inguinal pulses  Musculoskeletal: Normal range of motion. He exhibits no edema.  The patient has had both knees replaced. He has good mobility.  Lymphadenopathy:    He has no cervical adenopathy.  Neurological: He is alert and  oriented to person, place, and time. He has normal reflexes. No cranial nerve deficit.  Skin: Skin is warm and dry. No rash noted.  Psychiatric: He has a normal mood and affect. His behavior is normal. Judgment and thought content normal.  Nursing note and vitals reviewed.  BP (!) 149/72 (BP Location: Left Arm)   Pulse 65   Temp 97 F (36.1 C) (Oral)   Ht _0  (1.803 m)   Wt 158 lb (71.7 kg)   BMI 22.04 kg/m   LS-spine films are pending      Assessment & Plan:  1. Essential hypertension -The blood pressure is slightly elevated today but because of his age and will be no change in treatment. - CBC with Differential/Platelet - BMP8+EGFR - Hepatic function panel  2. Testosterone deficiency -He is currently receiving no treatment for this and does not complain of any fatigue today. - CBC with Differential/Platelet  3. Other hyperlipidemia -Continue with aggressive therapeutic lifestyle changes  and pravastatin therapy pending results of lab work - CBC with Differential/Platelet - Lipid panel  4. Vitamin D deficiency -Continue with vitamin D replacement pending results of lab work - CBC with Differential/Platelet - VITAMIN D 25 Hydroxy (Vit-D Deficiency, Fractures)  5. Prostate cancer ([place]) -Continue to follow-up with urology - CBC with Differential/Platelet  6. Atrial flutter, unspecified type ([place]) -Continue to follow-up with cardiology and continue with blood thinner, Eliquis  7. S/P CABG x 4 -Follow-up with cardiology  8. Numbness and tingling of both lower extremities - Vitamin B12 - DG Lumbar Spine 2-3 Views; Future -Try wearing support hose  9. Neuropathy ([place]) -Lab work and x-rays as above and try wearing support hose until more information is obtained  Patient Instructions                       Medicare Annual Wellness Visit  [facility] and the medical providers at [facility] strive to bring you the best medical care.  In doing  so we not only want to address your current medical conditions and concerns but also to detect new conditions early and prevent illness, disease and health-related problems.    Medicare offers a yearly Wellness Visit which allows our clinical staff to assess your need for preventative services including immunizations, lifestyle education, counseling to decrease risk of preventable diseases and screening for fall risk and other medical concerns.    This visit is provided free of charge (no copay) for all Medicare recipients. The clinical pharmacists at [facility] have begun to conduct these Wellness Visits which will also include a thorough review of all your medications.    As you primary medical provider recommend that you make an appointment for your Annual Wellness Visit if you have not done so already this year.  You may set up this appointment before you leave today or you may call back ([phone]) and schedule an appointment.  Please make sure when you call that you mention that you are scheduling your Annual Wellness Visit with the clinical pharmacist so that the appointment may be made for the proper length of time.     Continue current medications. Continue good therapeutic lifestyle changes which include good diet and exercise. Fall precautions discussed with patient. If an FOBT was given today- please return it to our front desk. If you are over 51 years old - you may need Prevnar 45 or the adult Pneumonia vaccine.  **Flu shots are available--- please call and schedule a FLU-CLINIC appointment**  After your visit with [place] today you will receive a survey in the mail or online from [employer] regarding your care with [place]. Please take a moment to fill this out. Your feedback is very important to [place] as you can help [place] better understand your patient needs as well as improve your experience and satisfaction. WE CARE ABOUT YOU!!!   The flu shot that he received today may  make your arm sore We will call you with your lab work results as soon as those results become available We are adding a B12 level to your blood work today as this may contribute to a poor for neuropathy We will also get x-rays of your low back to see if any nerve impingement problems are causing problems with your legs Please add melatonin 3 mg to your current trazodone to help you sleep Avoid caffeine as much as possible The patient should try wearing some support  hose to see if this helps his legs feel better while we get the lab work and x-ray results back   Arrie Senate MD

## 2016-05-25 NOTE — Patient Instructions (Addendum)
Medicare Annual Wellness Visit  [facility] and the medical providers at [facility] strive to bring you the best medical care.  In doing so we not only want to address your current medical conditions and concerns but also to detect new conditions early and prevent illness, disease and health-related problems.    Medicare offers a yearly Wellness Visit which allows our clinical staff to assess your need for preventative services including immunizations, lifestyle education, counseling to decrease risk of preventable diseases and screening for fall risk and other medical concerns.    This visit is provided free of charge (no copay) for all Medicare recipients. The clinical pharmacists at [facility] have begun to conduct these Wellness Visits which will also include a thorough review of all your medications.    As you primary medical provider recommend that you make an appointment for your Annual Wellness Visit if you have not done so already this year.  You may set up this appointment before you leave today or you may call back [phone] and schedule an appointment.  Please make sure when you call that you mention that you are scheduling your Annual Wellness Visit with the clinical pharmacist so that the appointment may be made for the proper length of time.     Continue current medications. Continue good therapeutic lifestyle changes which include good diet and exercise. Fall precautions discussed with patient. If an FOBT was given today- please return it to our front desk. If you are over 44 years old - you may need Prevnar 67 or the adult Pneumonia vaccine.  **Flu shots are available--- please call and schedule a FLU-CLINIC appointment**  After your visit with [place] today you will receive a survey in the mail or online from [employer] regarding your care with [place]. Please take a moment to fill this out. Your feedback is very  important to [place] as you can help [place] better understand your patient needs as well as improve your experience and satisfaction. WE CARE ABOUT YOU!!!   The flu shot that he received today may make your arm sore We will call you with your lab work results as soon as those results become available We are adding a B12 level to your blood work today as this may contribute to a poor for neuropathy We will also get x-rays of your low back to see if any nerve impingement problems are causing problems with your legs Please add melatonin 3 mg to your current trazodone to help you sleep Avoid caffeine as much as possible The patient should try wearing some support hose to see if this helps his legs feel better while we get the lab work and x-ray results back

## 2016-05-26 LAB — HEPATIC FUNCTION PANEL
ALBUMIN: 4.3 g/dL (ref 3.5–4.7)
ALK PHOS: 110 IU/L (ref 39–117)
ALT: 18 IU/L (ref 0–44)
AST: 24 IU/L (ref 0–40)
BILIRUBIN TOTAL: 0.5 mg/dL (ref 0.0–1.2)
BILIRUBIN, DIRECT: 0.18 mg/dL (ref 0.00–0.40)
TOTAL PROTEIN: 7.1 g/dL (ref 6.0–8.5)

## 2016-05-26 LAB — CBC WITH DIFFERENTIAL/PLATELET
BASOS ABS: 0 10*3/uL (ref 0.0–0.2)
Basos: 0 %
EOS (ABSOLUTE): 0.1 10*3/uL (ref 0.0–0.4)
EOS: 1 %
HEMATOCRIT: 39 % (ref 37.5–51.0)
HEMOGLOBIN: 12.6 g/dL (ref 12.6–17.7)
IMMATURE GRANS (ABS): 0 10*3/uL (ref 0.0–0.1)
Immature Granulocytes: 0 %
LYMPHS ABS: 0.8 10*3/uL (ref 0.7–3.1)
Lymphs: 11 %
MCH: 30.4 pg (ref 26.6–33.0)
MCHC: 32.3 g/dL (ref 31.5–35.7)
MCV: 94 fL (ref 79–97)
Monocytes Absolute: 0.7 10*3/uL (ref 0.1–0.9)
Monocytes: 9 %
NEUTROS ABS: 5.7 10*3/uL (ref 1.4–7.0)
Neutrophils: 79 %
Platelets: 300 10*3/uL (ref 150–379)
RBC: 4.15 x10E6/uL (ref 4.14–5.80)
RDW: 14 % (ref 12.3–15.4)
WBC: 7.3 10*3/uL (ref 3.4–10.8)

## 2016-05-26 LAB — BMP8+EGFR
BUN/Creatinine Ratio: 17 (ref 10–24)
BUN: 26 mg/dL (ref 8–27)
CALCIUM: 10 mg/dL (ref 8.6–10.2)
CO2: 25 mmol/L (ref 18–29)
CREATININE: 1.53 mg/dL — AB (ref 0.76–1.27)
Chloride: 100 mmol/L (ref 96–106)
GFR calc Af Amer: 47 mL/min/{1.73_m2} — ABNORMAL LOW (ref >59)
GFR, EST NON AFRICAN AMERICAN: 40 mL/min/{1.73_m2} — AB (ref >59)
GLUCOSE: 88 mg/dL (ref 65–99)
Potassium: 3.8 mmol/L (ref 3.5–5.2)
Sodium: 141 mmol/L (ref 134–144)

## 2016-05-26 LAB — VITAMIN B12: Vitamin B-12: 719 pg/mL (ref 211–946)

## 2016-05-26 LAB — LIPID PANEL
CHOL/HDL RATIO: 2.8 ratio (ref 0.0–5.0)
CHOLESTEROL TOTAL: 139 mg/dL (ref 100–199)
HDL: 50 mg/dL (ref >39)
LDL CALC: 71 mg/dL (ref 0–99)
Triglycerides: 90 mg/dL (ref 0–149)
VLDL CHOLESTEROL CAL: 18 mg/dL (ref 5–40)

## 2016-05-26 LAB — VITAMIN D 25 HYDROXY (VIT D DEFICIENCY, FRACTURES): Vit D, 25-Hydroxy: 50.4 ng/mL (ref 30.0–100.0)

## 2016-06-05 NOTE — Progress Notes (Signed)
HPI The patient presents as a new patient for me.  He is s/p CABG in 2014.  Since I last saw him last year he has had TKR.  Since then he has been doing well.  The patient denies any new symptoms such as chest discomfort, neck or arm discomfort. There has been no new shortness of breath, PND or orthopnea. There have been no reported palpitations, presyncope or syncope.  He is splitting wood.    Allergies  Allergen Reactions  . Lipitor [Atorvastatin] Other (See Comments)    "feel bad"  . Penicillins Itching    Has patient had a PCN reaction causing immediate rash, facial/tongue/throat swelling, SOB or lightheadedness with hypotension: Yes Has patient had a PCN reaction causing severe rash involving mucus membranes or skin necrosis: No Has patient had a PCN reaction that required hospitalization No Has patient had a PCN reaction occurring within the last 10 years: No If all of the above answers are "NO", then may proceed with Cephalosporin use.     Current Outpatient Prescriptions  Medication Sig Dispense Refill  . apixaban (ELIQUIS) 2.5 MG TABS tablet Take 1 tablet (2.5 mg total) by mouth 2 (two) times daily. 180 tablet 3  . Cholecalciferol (VITAMIN D3) 1000 UNITS CAPS Take 1,000 Units by mouth daily.     Marland Kitchen diltiazem (CARDIZEM CD) 240 MG 24 hr capsule Take 240 mg by mouth daily.    . furosemide (LASIX) 40 MG tablet Take 40 mg by mouth daily.    Marland Kitchen levothyroxine (SYNTHROID, LEVOTHROID) 25 MCG tablet TAKE 1 TABLET EVERY DAY BEFORE BREAKFAST 90 tablet 0  . omeprazole (PRILOSEC) 40 MG capsule Take 1 capsule (40 mg total) by mouth daily. 90 capsule 3  . oxybutynin (DITROPAN) 5 MG tablet Take 1 tablet (5 mg total) by mouth daily. (Patient taking differently: Take 5 mg by mouth daily after breakfast. ) 90 tablet 3  . pramipexole (MIRAPEX) 1 MG tablet Take 1 mg by mouth at bedtime.    . pravastatin (PRAVACHOL) 40 MG tablet Take 1 tablet (40 mg total) by mouth daily. 90 tablet 3  . temazepam  (RESTORIL) 7.5 MG capsule Take 1 capsule (7.5 mg total) by mouth at bedtime as needed for sleep. 90 capsule 0  . traZODone (DESYREL) 100 MG tablet Take 0.5 tablets (50 mg total) by mouth at bedtime as needed for sleep. 45 tablet 1  . nitroGLYCERIN (NITROSTAT) 0.4 MG SL tablet Place 1 tablet (0.4 mg total) under the tongue every 5 (five) minutes as needed for chest pain. 25 tablet prn   No current facility-administered medications for this visit.     Past Medical History:  Diagnosis Date  . Arthritis   . Atrial fibrillation and flutter ([place])   . Choledocholithiasis   . CKD (chronic kidney disease) 08/2013   stage 3  . Coronary artery disease   . Dysrhythmia   . GERD (gastroesophageal reflux disease)    Large HH with intrathoracic component.   . H/O hiatal hernia   . Hyperlipemia   . Hypertension   . Hypothyroidism   . Inguinal hernia    left  . Pneumonia   . Prostate cancer [facility])     Past Surgical History:  Procedure Laterality Date  . CATARACT EXTRACTION Bilateral   . COLONOSCOPY  2003, 01/2012   non-bleeding cecal AVMs, descending and sigmoid tics.   . CORONARY ANGIOPLASTY    . CORONARY ARTERY BYPASS GRAFT N/A 09/03/2013   Procedure: CORONARY ARTERY BYPASS  GRAFTING times four using left internal mammary and bilateral saphenous vein.;  Surgeon: Ivin Poot, MD;  Location: [place];  Service: Open Heart Surgery;  Laterality: N/A;  . ERCP N/A 04/04/2015   Procedure: ENDOSCOPIC RETROGRADE CHOLANGIOPANCREATOGRAPHY (ERCP);  Surgeon: Inda Castle, MD;  Location: [place];  Service: Endoscopy;  Laterality: N/A;  . ERCP N/A 04/06/2015   Procedure: ENDOSCOPIC RETROGRADE CHOLANGIOPANCREATOGRAPHY (ERCP);  Surgeon: Inda Castle, MD;  Location: [facility];  Service: Endoscopy;  Laterality: N/A;  . ESOPHAGOGASTRODUODENOSCOPY  01/2012   11 cm sliding hiatal hernia.  Marland Kitchen EYE SURGERY     cataracts removed, ? IOL  . HERNIA REPAIR    . INTRAOPERATIVE TRANSESOPHAGEAL ECHOCARDIOGRAM N/A  09/03/2013   Procedure: INTRAOPERATIVE TRANSESOPHAGEAL ECHOCARDIOGRAM;  Surgeon: Ivin Poot, MD;  Location: [place];  Service: Open Heart Surgery;  Laterality: N/A;  . LEFT HEART CATHETERIZATION WITH CORONARY ANGIOGRAM N/A 08/03/2013   Procedure: LEFT HEART CATHETERIZATION WITH CORONARY ANGIOGRAM;  Surgeon: Blane Ohara, MD;  Location: [facility] CATH LAB;  Service: Cardiovascular;  Laterality: N/A;  . PROSTATE SURGERY    . Right thoracotomy  As a young child, ? 35 years old   for pneumonia   . TOTAL KNEE ARTHROPLASTY Right 03/11/2014   Procedure: TOTAL KNEE ARTHROPLASTY;  Surgeon: Hessie Dibble, MD;  Location: [place];  Service: Orthopedics;  Laterality: Right;  . TOTAL KNEE ARTHROPLASTY Left 09/20/2015   Procedure: TOTAL KNEE ARTHROPLASTY;  Surgeon: Melrose Nakayama, MD;  Location: [place];  Service: Orthopedics;  Laterality: Left;    ROS:   As stated in the HPI and negative for all other systems.  PHYSICAL EXAM BP 138/80   Pulse 70   Ht 5\' 11"  (1.803 m)   Wt 162 lb (73.5 kg)   BMI 22.59 kg/m  GENERAL:  Well appearing NECK:  No jugular venous distention, waveform within normal limits, carotid upstroke brisk and symmetric, no bruits, no thyromegaly LUNGS:  Clear to auscultation bilaterally CHEST: Well healed sternotomy scar. HEART:  PMI not displaced or sustained,S1 and S2 within normal limits, no S3, no S4, no clicks, no rubs, 3/6 apical systolic murmur early peaking and radiating out the aortic outflow tract, no diastolic murmurs ABD:  Flat, positive bowel sounds normal in frequency in pitch, positive bruits, no rebound, no guarding, no midline pulsatile mass, no hepatomegaly, no splenomegaly (of note he has mild hyperactive bowel sounds with expiration his chest it is very curious likely related to his large hiatal hernia). EXT:  2 plus pulses throughout, no edema, no cyanosis no clubbing, left femoral bruit SKIN:  Red area anterior right neck.   EKG:  Sinus rhythm, rate 69, leftward axis,  premature ventricular contractions, no acute ST-T wave changes.  ASSESSMENT AND PLAN  ATRIAL FLUTTER/FIB:   He will continue with anticoagulation. No change in therapy is indicated.   [name] has a CHA2DS2 - VASc score of 4 with a risk of stroke of 4%.  CAD:   The patient has no new sypmtoms.  No further cardiovascular testing is indicated.  We will continue with aggressive risk reduction and meds as listed.  HTN:  His BP is at target.  He'll remain on the meds as listed.

## 2016-06-06 ENCOUNTER — Ambulatory Visit (INDEPENDENT_AMBULATORY_CARE_PROVIDER_SITE_OTHER): Payer: Medicare PPO | Admitting: Cardiology

## 2016-06-06 ENCOUNTER — Encounter: Payer: Self-pay | Admitting: Cardiology

## 2016-06-06 VITALS — BP 138/80 | HR 70 | Ht 71.0 in | Wt 162.0 lb

## 2016-06-06 DIAGNOSIS — I4892 Unspecified atrial flutter: Secondary | ICD-10-CM | POA: Diagnosis not present

## 2016-06-06 DIAGNOSIS — I700 Atherosclerosis of aorta: Secondary | ICD-10-CM | POA: Diagnosis not present

## 2016-06-06 MED ORDER — NITROGLYCERIN 0.4 MG SL SUBL: 0.4000 mg | SUBLINGUAL_TABLET | SUBLINGUAL | 99 refills | Status: AC | PRN

## 2016-06-06 NOTE — Patient Instructions (Signed)

## 2016-07-27 ENCOUNTER — Telehealth: Payer: Self-pay | Admitting: *Deleted

## 2016-07-27 NOTE — Telephone Encounter (Signed)
Eliquis samples placed at the front desk per patients request.

## 2016-08-03 ENCOUNTER — Ambulatory Visit (HOSPITAL_BASED_OUTPATIENT_CLINIC_OR_DEPARTMENT_OTHER): Payer: Medicare PPO | Attending: Family Medicine

## 2016-08-03 ENCOUNTER — Emergency Department
Admission: EM | Admit: 2016-08-03 | Discharge: 2016-08-03 | Disposition: A | Discharge status: Home / Self Care | Payer: Medicare PPO | Source: Home / Self Care | Attending: Family Medicine | Admitting: Family Medicine

## 2016-08-03 DIAGNOSIS — R1032 Left lower quadrant pain: Secondary | ICD-10-CM

## 2016-08-03 DIAGNOSIS — N3941 Urge incontinence: Secondary | ICD-10-CM

## 2016-08-03 LAB — POCT URINALYSIS DIP (MANUAL ENTRY)
Bilirubin, UA: NEGATIVE
Glucose, UA: NEGATIVE
Ketones, POC UA: NEGATIVE
LEUKOCYTES UA: NEGATIVE
NITRITE UA: NEGATIVE
PH UA: 5.5 (ref 5–8)
PROTEIN UA: NEGATIVE
RBC UA: NEGATIVE
Spec Grav, UA: 1.01 (ref 1.005–1.03)
UROBILINOGEN UA: NEGATIVE (ref 0–1)

## 2016-08-03 MED ORDER — MIRABEGRON ER 25 MG PO TB24: 25.0000 mg | ORAL_TABLET | Freq: Every day | ORAL | 0 refills | Status: DC

## 2016-08-03 NOTE — ED Provider Notes (Signed)
[facility]    CSN: [id number] Arrival date & time: 08/03/16  1109     History   Chief Complaint Chief Complaint  Patient presents with  . Abdominal Pain    LLQ    HPI [name] is a 82 y.o. male.   Patient states that he awoke yesterday morning with a painful bulge ("about the size of an orange") in his left inguinal area that persisted for about 20 minutes.  The bulging recurred again this morning and then receded.  He feels well otherwise.  No change in bowel movements.  No nausea/vomiting.  No fevers, chills, and sweats.  He states that he had been splitting wood last week.  He also complains of chronic recurring stress incontinence, and has had increased frequency for two weeks.  No dysuria.  He states that a friend has had good results with Myrbetriq for incontinence, and he would like to try a prescription of Myrbetriq.   The history is provided by the patient.  Abdominal Pain  Pain location: left inguinal region. Pain quality: aching   Pain radiates to:  Does not radiate Pain severity:  Moderate Onset quality:  Sudden Duration:  1 day Timing:  Sporadic Chronicity:  New Context: awakening from sleep and previous surgery   Context: not diet changes, not eating, not recent illness, not recent travel and not trauma   Relieved by:  None tried Worsened by:  Movement Ineffective treatments:  None tried Associated symptoms: no anorexia, no belching, no chest pain, no chills, no constipation, no cough, no diarrhea, no dysuria, no fatigue, no fever, no flatus, no hematochezia, no hematuria, no melena, no nausea and no vomiting     Past Medical History:  Diagnosis Date  . Arthritis   . Atrial fibrillation and flutter ([place])   . Choledocholithiasis   . CKD (chronic kidney disease) 08/2013   stage 3  . Coronary artery disease   . Dysrhythmia   . GERD (gastroesophageal reflux disease)    Large HH with intrathoracic component.   . H/O hiatal hernia   .  Hyperlipemia   . Hypertension   . Hypothyroidism   . Inguinal hernia    left  . Pneumonia   . Prostate cancer [facility])     Patient Active Problem List   Diagnosis Date Noted  . Neuropathy ([place]) 05/25/2016  . Aortic atherosclerosis ([place]) 05/25/2016  . Primary osteoarthritis of left knee 09/20/2015  . Anemia, chronic renal failure 04/15/2015  . Edema 04/12/2015  . Hyperbilirubinemia   . Choledocholithiasis 04/06/2015  . Hypoxia   . Abdominal pain, epigastric   . Abnormal liver enzymes   . Abnormal LFTs   . Biliary sepsis   . Acute respiratory failure ([place])   . Severe sepsis ([place])   . Bacteremia due to Gram-negative bacteria   . Hiatal hernia   . Sepsis ([place]) 03/28/2015  . Incisional hernia, without obstruction or gangrene 08/17/2014  . Atrial flutter ([place]) 07/28/2014  . Palpitation 06/16/2014  . Right knee DJD 03/11/2014  . S/P CABG x 4 09/03/2013  . Insomnia 07/27/2013  . Testosterone deficiency 07/27/2013  . Hyperlipidemia 06/10/2013  . Heart murmur 06/10/2013  . Dyspnea 06/10/2013  . Inguinal hernia, left. 04/04/2012  . Paraesophageal hiatal hernia 03/24/2012  . Prostate cancer ([place]) 01/15/2012  . Special screening for malignant neoplasms, colon 01/15/2012    Past Surgical History:  Procedure Laterality Date  . CATARACT EXTRACTION Bilateral   . COLONOSCOPY  2003, 01/2012  non-bleeding cecal AVMs, descending and sigmoid tics.   . CORONARY ANGIOPLASTY    . CORONARY ARTERY BYPASS GRAFT N/A 09/03/2013   Procedure: CORONARY ARTERY BYPASS GRAFTING times four using left internal mammary and bilateral saphenous vein.;  Surgeon: Ivin Poot, MD;  Location: [place];  Service: Open Heart Surgery;  Laterality: N/A;  . ERCP N/A 04/04/2015   Procedure: ENDOSCOPIC RETROGRADE CHOLANGIOPANCREATOGRAPHY (ERCP);  Surgeon: Inda Castle, MD;  Location: [place];  Service: Endoscopy;  Laterality: N/A;  . ERCP N/A 04/06/2015   Procedure: ENDOSCOPIC RETROGRADE  CHOLANGIOPANCREATOGRAPHY (ERCP);  Surgeon: Inda Castle, MD;  Location[facility];  Service: Endoscopy;  Laterality: N/A;  . ESOPHAGOGASTRODUODENOSCOPY  01/2012   11 cm sliding hiatal hernia.  Marland Kitchen EYE SURGERY     cataracts removed, ? IOL  . HERNIA REPAIR    . INTRAOPERATIVE TRANSESOPHAGEAL ECHOCARDIOGRAM N/A 09/03/2013   Procedure: INTRAOPERATIVE TRANSESOPHAGEAL ECHOCARDIOGRAM;  Surgeon: Ivin Poot, MD;  Location: [place];  Service: Open Heart Surgery;  Laterality: N/A;  . LEFT HEART CATHETERIZATION WITH CORONARY ANGIOGRAM N/A 08/03/2013   Procedure: LEFT HEART CATHETERIZATION WITH CORONARY ANGIOGRAM;  Surgeon: Blane Ohara, MD;  Location: [facility] CATH LAB;  Service: Cardiovascular;  Laterality: N/A;  . PROSTATE SURGERY    . Right thoracotomy  As a young child, ? 56 years old   for pneumonia   . TOTAL KNEE ARTHROPLASTY Right 03/11/2014   Procedure: TOTAL KNEE ARTHROPLASTY;  Surgeon: Hessie Dibble, MD;  Location: [place];  Service: Orthopedics;  Laterality: Right;  . TOTAL KNEE ARTHROPLASTY Left 09/20/2015   Procedure: TOTAL KNEE ARTHROPLASTY;  Surgeon: Melrose Nakayama, MD;  Location: [place];  Service: Orthopedics;  Laterality: Left;       Home Medications    Prior to Admission medications   Medication Sig Start Date End Date Taking? Authorizing Provider  apixaban (ELIQUIS) 2.5 MG TABS tablet Take 1 tablet (2.5 mg total) by mouth 2 (two) times daily. 09/08/15   Chipper Herb, MD  Cholecalciferol (VITAMIN D3) 1000 UNITS CAPS Take 1,000 Units by mouth daily.     Historical Provider, MD  diltiazem (CARDIZEM CD) 240 MG 24 hr capsule Take 240 mg by mouth daily.    Historical Provider, MD  furosemide (LASIX) 40 MG tablet Take 40 mg by mouth daily.    Historical Provider, MD  levothyroxine (SYNTHROID, LEVOTHROID) 25 MCG tablet TAKE 1 TABLET EVERY DAY BEFORE BREAKFAST 04/26/16   Sharion Balloon, FNP  mirabegron ER (MYRBETRIQ) 25 MG TB24 tablet Take 1 tablet (25 mg total) by mouth daily. 08/03/16    Kandra Nicolas, MD  nitroGLYCERIN (NITROSTAT) 0.4 MG SL tablet Place 1 tablet (0.4 mg total) under the tongue every 5 (five) minutes as needed for chest pain. 06/06/16 09/04/16  Minus Breeding, MD  omeprazole (PRILOSEC) 40 MG capsule Take 1 capsule (40 mg total) by mouth daily. 05/10/15   Chipper Herb, MD  oxybutynin (DITROPAN) 5 MG tablet Take 1 tablet (5 mg total) by mouth daily. Patient taking differently: Take 5 mg by mouth daily after breakfast.  05/10/15   Chipper Herb, MD  pramipexole (MIRAPEX) 1 MG tablet Take 1 mg by mouth at bedtime.    Historical Provider, MD  pravastatin (PRAVACHOL) 40 MG tablet Take 1 tablet (40 mg total) by mouth daily. 05/10/15   Chipper Herb, MD  temazepam (RESTORIL) 7.5 MG capsule Take 1 capsule (7.5 mg total) by mouth at bedtime as needed for sleep. 01/06/16  Chipper Herb, MD  traZODone (DESYREL) 100 MG tablet Take 0.5 tablets (50 mg total) by mouth at bedtime as needed for sleep. 01/24/16   Chipper Herb, MD    Family History Family History  Problem Relation Age of Onset  . Cancer Mother     bone marrow  . Colon cancer Neg Hx     Social History Social History  Substance Use Topics  . Smoking status: Former Smoker    Packs/day: 1.00    Types: Cigarettes    Quit date: 08/20/1978  . Smokeless tobacco: Never Used  . Alcohol use No     Allergies   Lipitor [atorvastatin] and Penicillins   Review of Systems Review of Systems  Constitutional: Negative for chills, fatigue and fever.  Respiratory: Negative for cough.   Cardiovascular: Negative for chest pain.  Gastrointestinal: Positive for abdominal pain. Negative for anorexia, constipation, diarrhea, flatus, hematochezia, melena, nausea and vomiting.  Genitourinary: Positive for frequency. Negative for dysuria, flank pain and hematuria.       Incontinence  Musculoskeletal: Negative.   Skin: Negative.      Physical Exam Triage Vital Signs ED Triage Vitals  Enc Vitals Group     BP  08/03/16 1143 (!) 208/78     Pulse Rate 08/03/16 1143 88     Resp --      Temp 08/03/16 1143 97.5 F (36.4 C)     Temp Source 08/03/16 1143 Oral     SpO2 08/03/16 1143 97 %     Weight 08/03/16 1144 159 lb 6.4 oz (72.3 kg)     Height 08/03/16 1144 5\' 11"  (1.803 m)     Head Circumference --      Peak Flow --      Pain Score 08/03/16 1145 0     Pain Loc --      Pain Edu? --      Excl. in [place]? --    No data found.   Updated Vital Signs BP 197/96   Pulse 88   Temp 97.5 F (36.4 C) (Oral)   Ht 5\' 11"  (1.803 m)   Wt 159 lb 6.4 oz (72.3 kg)   SpO2 97%   BMI 22.23 kg/m   Visual Acuity Right Eye Distance:   Left Eye Distance:   Bilateral Distance:    Right Eye Near:   Left Eye Near:    Bilateral Near:     Physical Exam  Constitutional: He appears well-developed and well-nourished. No distress.  HENT:  Head: Atraumatic.  Mouth/Throat: Oropharynx is clear and moist.  Eyes: EOM are normal. Pupils are equal, round, and reactive to light.  Neck: Normal range of motion.  Cardiovascular: Normal heart sounds.   Pulmonary/Chest: Breath sounds normal.  Abdominal: Soft. Bowel sounds are normal.  Genitourinary:     Genitourinary Comments: Tenderness/fullness left inguinal region with valsalva as noted on diagram.  No evidence incarcerated hernia. Testes/penis/scrotum normal.  No evidence indirect hernia.  Neurological: He is alert.  Skin: Skin is warm and dry.  Nursing note and vitals reviewed.    UC Treatments / Results  Labs (all labs ordered are listed, but only abnormal results are displayed) Labs Reviewed  URINE CULTURE   Narrative:    Performed at:  [facility], [id number]                [place], [place]  [address]  POCT URINALYSIS DIP (MANUAL ENTRY) negative    EKG  EKG Interpretation None       Radiology US Pelvis Limited  Result Date: 08/03/2016 CLINICAL DATA:  Left inguinal pain and swelling for 2 days EXAM:  LIMITED ULTRASOUND OF PELVIS TECHNIQUE: Limited transabdominal ultrasound examination of the pelvis was performed. COMPARISON:  None. FINDINGS: Sonographic evaluation in the area of clinical concern no findings to suggest hernia are identified. Normal subcutaneous tissues are noted in the area of clinical concern. Peristalsing bowel is noted within the abdominal cavity below the normal musculature. IMPRESSION: No evidence of left inguinal hernia. Electronically Signed   By: Inez Catalina M.D.   On: 08/03/2016 14:15    Procedures Procedures (including critical care time)  Medications Ordered in UC Medications - No data to display   Initial Impression / Assessment and Plan / UC Course  I have reviewed the triage vital signs and the nursing notes.  Pertinent labs & imaging results that were available during my care of the patient were reviewed by me and considered in my medical decision making (see chart for details).  Clinical Course   ?intermittent direct inguinal hernia, not visible on ultrasound. Followup with family doctor for inguinal pain (may need CT scan). Begin trial of Myrbetriq for stress incontinence (limit to 25mg  daily; creatinine clearance calc. 35) Urine culture pending.    Final Clinical Impressions(s) / UC Diagnoses   Final diagnoses:  Urge incontinence of urine  Inguinal pain, left    New Prescriptions Discharge Medication List as of 08/03/2016  5:34 PM    START taking these medications   Details  mirabegron ER (MYRBETRIQ) 25 MG TB24 tablet Take 1 tablet (25 mg total) by mouth daily., Starting Fri 08/03/2016, Print         Kandra Nicolas, MD 08/18/16 [phone]

## 2016-08-03 NOTE — ED Triage Notes (Signed)
Pt has LLQ pain for about 20 minutes yesterday, it resolved on its own.  Then started when he got out of bed this morning, pain level about a 8-9.  Lasted longer than yesterday, but resolved.  Pt stated that he felt a knot on his abdomen both times that went away.

## 2016-08-03 NOTE — Discharge Instructions (Signed)
Followup with family doctor for inguinal pain (may need CT scan).

## 2016-08-05 LAB — URINE CULTURE: ORGANISM ID, BACTERIA: NO GROWTH

## 2016-08-06 ENCOUNTER — Telehealth: Payer: Self-pay | Admitting: *Deleted

## 2016-08-06 NOTE — Telephone Encounter (Signed)
Callback: Patient advised of negative UCX. Encouraged to f/u with PCP. He verbalized understanding.

## 2016-08-07 ENCOUNTER — Emergency Department (HOSPITAL_COMMUNITY)
Admission: EM | Admit: 2016-08-07 | Discharge: 2016-08-07 | Disposition: A | Discharge status: Home / Self Care | Payer: Medicare PPO | Attending: Emergency Medicine | Admitting: Emergency Medicine

## 2016-08-07 ENCOUNTER — Ambulatory Visit (INDEPENDENT_AMBULATORY_CARE_PROVIDER_SITE_OTHER): Payer: Medicare PPO | Admitting: Family Medicine

## 2016-08-07 ENCOUNTER — Encounter: Payer: Self-pay | Admitting: Family Medicine

## 2016-08-07 ENCOUNTER — Encounter (HOSPITAL_COMMUNITY): Payer: Self-pay

## 2016-08-07 VITALS — BP 151/80 | HR 86 | Temp 97.4°F | Ht 70.0 in | Wt 159.4 lb

## 2016-08-07 DIAGNOSIS — R350 Frequency of micturition: Secondary | ICD-10-CM | POA: Diagnosis not present

## 2016-08-07 DIAGNOSIS — Z951 Presence of aortocoronary bypass graft: Secondary | ICD-10-CM | POA: Insufficient documentation

## 2016-08-07 DIAGNOSIS — Z87891 Personal history of nicotine dependence: Secondary | ICD-10-CM | POA: Insufficient documentation

## 2016-08-07 DIAGNOSIS — Z7901 Long term (current) use of anticoagulants: Secondary | ICD-10-CM | POA: Diagnosis not present

## 2016-08-07 DIAGNOSIS — Z8546 Personal history of malignant neoplasm of prostate: Secondary | ICD-10-CM | POA: Insufficient documentation

## 2016-08-07 DIAGNOSIS — Z96653 Presence of artificial knee joint, bilateral: Secondary | ICD-10-CM | POA: Diagnosis not present

## 2016-08-07 DIAGNOSIS — E039 Hypothyroidism, unspecified: Secondary | ICD-10-CM | POA: Diagnosis not present

## 2016-08-07 DIAGNOSIS — I129 Hypertensive chronic kidney disease with stage 1 through stage 4 chronic kidney disease, or unspecified chronic kidney disease: Secondary | ICD-10-CM | POA: Diagnosis not present

## 2016-08-07 DIAGNOSIS — K4090 Unilateral inguinal hernia, without obstruction or gangrene, not specified as recurrent: Secondary | ICD-10-CM

## 2016-08-07 DIAGNOSIS — I2510 Atherosclerotic heart disease of native coronary artery without angina pectoris: Secondary | ICD-10-CM | POA: Insufficient documentation

## 2016-08-07 DIAGNOSIS — N183 Chronic kidney disease, stage 3 (moderate): Secondary | ICD-10-CM | POA: Diagnosis not present

## 2016-08-07 DIAGNOSIS — R1032 Left lower quadrant pain: Secondary | ICD-10-CM

## 2016-08-07 LAB — MICROSCOPIC EXAMINATION
BACTERIA UA: NONE SEEN
Epithelial Cells (non renal): NONE SEEN /hpf (ref 0–10)
RBC, UA: NONE SEEN /hpf (ref 0–?)
RENAL EPITHEL UA: NONE SEEN /HPF
WBC UA: NONE SEEN /HPF (ref 0–?)

## 2016-08-07 LAB — URINALYSIS, COMPLETE
Bilirubin, UA: NEGATIVE
GLUCOSE, UA: NEGATIVE
Ketones, UA: NEGATIVE
LEUKOCYTES UA: NEGATIVE
Nitrite, UA: NEGATIVE
PH UA: 5.5 (ref 5.0–7.5)
PROTEIN UA: NEGATIVE
RBC, UA: NEGATIVE
Specific Gravity, UA: <1.005 — ABNORMAL LOW (ref 1.005–1.030)
UUROB: 0.2 mg/dL (ref 0.2–1.0)

## 2016-08-07 LAB — BASIC METABOLIC PANEL
ANION GAP: 9 (ref 5–15)
BUN: 23 mg/dL — ABNORMAL HIGH (ref 6–20)
CALCIUM: 10 mg/dL (ref 8.9–10.3)
CO2: 26 mmol/L (ref 22–32)
Chloride: 102 mmol/L (ref 101–111)
Creatinine, Ser: 1.29 mg/dL — ABNORMAL HIGH (ref 0.61–1.24)
GFR, EST AFRICAN AMERICAN: 56 mL/min — AB (ref >60)
GFR, EST NON AFRICAN AMERICAN: 48 mL/min — AB (ref >60)
Glucose, Bld: 96 mg/dL (ref 65–99)
POTASSIUM: 3.6 mmol/L (ref 3.5–5.1)
Sodium: 137 mmol/L (ref 135–145)

## 2016-08-07 LAB — CBC WITH DIFFERENTIAL/PLATELET
BASOS ABS: 0 10*3/uL (ref 0.0–0.1)
BASOS PCT: 0 %
Eosinophils Absolute: 0.1 10*3/uL (ref 0.0–0.7)
Eosinophils Relative: 1 %
HEMATOCRIT: 37.6 % — AB (ref 39.0–52.0)
Hemoglobin: 12.7 g/dL — ABNORMAL LOW (ref 13.0–17.0)
LYMPHS PCT: 8 %
Lymphs Abs: 0.6 10*3/uL — ABNORMAL LOW (ref 0.7–4.0)
MCH: 31.1 pg (ref 26.0–34.0)
MCHC: 33.8 g/dL (ref 30.0–36.0)
MCV: 92.2 fL (ref 78.0–100.0)
MONO ABS: 0.7 10*3/uL (ref 0.1–1.0)
Monocytes Relative: 10 %
NEUTROS ABS: 6 10*3/uL (ref 1.7–7.7)
Neutrophils Relative %: 81 %
PLATELETS: 236 10*3/uL (ref 150–400)
RBC: 4.08 MIL/uL — AB (ref 4.22–5.81)
RDW: 13 % (ref 11.5–15.5)
WBC: 7.4 10*3/uL (ref 4.0–10.5)

## 2016-08-07 NOTE — ED Provider Notes (Signed)
[facility] DEPT Provider Note   CSN: [id number] Arrival date & time: 08/07/16  1626     History   Chief Complaint Chief Complaint  Patient presents with  . Hernia    HPI [name] is a 82 y.o. male.  HPI Pt presents to the ED from his Dr's office today for LLQ pain and probable hernia.  Pt states he has been having pain in his LLQ for at least the last week.  He has had intermittent episodes where that area has become very swollen and tender.  He was seen at an urgent care and had an [place] that was negative.  His sx have persisted however.  It increases with activity.  No vomiting or diarrhea.  His sx have decreased from previously. The bulge comes and goes.  He went to his PCP today and was sent to the ED to have a surgical evaluation.   Past Medical History:  Diagnosis Date  . Arthritis   . Atrial fibrillation and flutter ([place])   . Choledocholithiasis   . CKD (chronic kidney disease) 08/2013   stage 3  . Coronary artery disease   . Dysrhythmia   . GERD (gastroesophageal reflux disease)    Large HH with intrathoracic component.   . H/O hiatal hernia   . Hyperlipemia   . Hypertension   . Hypothyroidism   . Inguinal hernia    left  . Pneumonia   . Prostate cancer [facility])     Patient Active Problem List   Diagnosis Date Noted  . Neuropathy ([place]) 05/25/2016  . Aortic atherosclerosis ([place]) 05/25/2016  . Primary osteoarthritis of left knee 09/20/2015  . Anemia, chronic renal failure 04/15/2015  . Edema 04/12/2015  . Hyperbilirubinemia   . Choledocholithiasis 04/06/2015  . Hypoxia   . Abdominal pain, epigastric   . Abnormal liver enzymes   . Abnormal LFTs   . Biliary sepsis   . Acute respiratory failure ([place])   . Severe sepsis ([place])   . Bacteremia due to Gram-negative bacteria   . Hiatal hernia   . Sepsis ([place]) 03/28/2015  . Incisional hernia, without obstruction or gangrene 08/17/2014  . Atrial flutter ([place]) 07/28/2014  . Palpitation 06/16/2014  . Right knee  DJD 03/11/2014  . S/P CABG x 4 09/03/2013  . Insomnia 07/27/2013  . Testosterone deficiency 07/27/2013  . Hyperlipidemia 06/10/2013  . Heart murmur 06/10/2013  . Dyspnea 06/10/2013  . Inguinal hernia, left. 04/04/2012  . Paraesophageal hiatal hernia 03/24/2012  . Prostate cancer ([place]) 01/15/2012  . Special screening for malignant neoplasms, colon 01/15/2012    Past Surgical History:  Procedure Laterality Date  . CATARACT EXTRACTION Bilateral   . COLONOSCOPY  2003, 01/2012   non-bleeding cecal AVMs, descending and sigmoid tics.   . CORONARY ANGIOPLASTY    . CORONARY ARTERY BYPASS GRAFT N/A 09/03/2013   Procedure: CORONARY ARTERY BYPASS GRAFTING times four using left internal mammary and bilateral saphenous vein.;  Surgeon: Ivin Poot, MD;  Location: [facility];  Service: Open Heart Surgery;  Laterality: N/A;  . ERCP N/A 04/04/2015   Procedure: ENDOSCOPIC RETROGRADE CHOLANGIOPANCREATOGRAPHY (ERCP);  Surgeon: Inda Castle, MD;  Location: [facility];  Service: Endoscopy;  Laterality: N/A;  . ERCP N/A 04/06/2015   Procedure: ENDOSCOPIC RETROGRADE CHOLANGIOPANCREATOGRAPHY (ERCP);  Surgeon: Inda Castle, MD;  Location: [facility];  Service: Endoscopy;  Laterality: N/A;  . ESOPHAGOGASTRODUODENOSCOPY  01/2012   11 cm sliding hiatal hernia.  Marland Kitchen EYE SURGERY     cataracts removed, ?  IOL  . HERNIA REPAIR    . INTRAOPERATIVE TRANSESOPHAGEAL ECHOCARDIOGRAM N/A 09/03/2013   Procedure: INTRAOPERATIVE TRANSESOPHAGEAL ECHOCARDIOGRAM;  Surgeon: Ivin Poot, MD;  Location: [facility];  Service: Open Heart Surgery;  Laterality: N/A;  . LEFT HEART CATHETERIZATION WITH CORONARY ANGIOGRAM N/A 08/03/2013   Procedure: LEFT HEART CATHETERIZATION WITH CORONARY ANGIOGRAM;  Surgeon: Blane Ohara, MD;  Location: [facility] CATH LAB;  Service: Cardiovascular;  Laterality: N/A;  . PROSTATE SURGERY    . Right thoracotomy  As a young child, ? 68 years old   for pneumonia   . TOTAL KNEE ARTHROPLASTY Right 03/11/2014    Procedure: TOTAL KNEE ARTHROPLASTY;  Surgeon: Hessie Dibble, MD;  Location: [facility];  Service: Orthopedics;  Laterality: Right;  . TOTAL KNEE ARTHROPLASTY Left 09/20/2015   Procedure: TOTAL KNEE ARTHROPLASTY;  Surgeon: Melrose Nakayama, MD;  Location: [facility];  Service: Orthopedics;  Laterality: Left;       Home Medications    Prior to Admission medications   Medication Sig Start Date End Date Taking? Authorizing Provider  apixaban (ELIQUIS) 2.5 MG TABS tablet Take 1 tablet (2.5 mg total) by mouth 2 (two) times daily. 09/08/15  Yes Chipper Herb, MD  Cholecalciferol (VITAMIN D3) 1000 UNITS CAPS Take 1,000 Units by mouth daily.    Yes Historical Provider, MD  diltiazem (CARDIZEM CD) 240 MG 24 hr capsule Take 240 mg by mouth daily.   Yes Historical Provider, MD  furosemide (LASIX) 40 MG tablet Take 40 mg by mouth daily.   Yes Historical Provider, MD  levothyroxine (SYNTHROID, LEVOTHROID) 25 MCG tablet TAKE 1 TABLET EVERY DAY BEFORE BREAKFAST 04/26/16  Yes Sharion Balloon, FNP  mirabegron ER (MYRBETRIQ) 25 MG TB24 tablet Take 1 tablet (25 mg total) by mouth daily. 08/03/16  Yes Kandra Nicolas, MD  nitroGLYCERIN (NITROSTAT) 0.4 MG SL tablet Place 1 tablet (0.4 mg total) under the tongue every 5 (five) minutes as needed for chest pain. 06/06/16 09/04/16 Yes Minus Breeding, MD  omeprazole (PRILOSEC) 40 MG capsule Take 1 capsule (40 mg total) by mouth daily. 05/10/15  Yes Chipper Herb, MD  oxybutynin (DITROPAN) 5 MG tablet Take 1 tablet (5 mg total) by mouth daily. Patient taking differently: Take 5 mg by mouth daily after breakfast.  05/10/15  Yes Chipper Herb, MD  pramipexole (MIRAPEX) 1 MG tablet Take 1 mg by mouth at bedtime.   Yes Historical Provider, MD  pravastatin (PRAVACHOL) 40 MG tablet Take 1 tablet (40 mg total) by mouth daily. 05/10/15  Yes Chipper Herb, MD  traZODone (DESYREL) 100 MG tablet Take 0.5 tablets (50 mg total) by mouth at bedtime as needed for sleep. 01/24/16  Yes Chipper Herb,  MD  temazepam (RESTORIL) 7.5 MG capsule Take 1 capsule (7.5 mg total) by mouth at bedtime as needed for sleep. Patient not taking: Reported on 08/07/2016 01/06/16   Chipper Herb, MD    Family History Family History  Problem Relation Age of Onset  . Cancer Mother     bone marrow  . Colon cancer Neg Hx     Social History Social History  Substance Use Topics  . Smoking status: Former Smoker    Packs/day: 1.00    Types: Cigarettes    Quit date: 08/20/1978  . Smokeless tobacco: Never Used  . Alcohol use No     Allergies   Lipitor [atorvastatin] and Penicillins   Review of Systems Review of Systems  All other systems reviewed and  are negative.    Physical Exam Updated Vital Signs BP 139/84   Pulse 60   Temp 97.6 F (36.4 C) (Oral)   Resp 16   SpO2 98%   Physical Exam  Constitutional: He appears well-developed and well-nourished. No distress.  HENT:  Head: Normocephalic and atraumatic.  Right Ear: External ear normal.  Left Ear: External ear normal.  Eyes: Conjunctivae are normal. Right eye exhibits no discharge. Left eye exhibits no discharge. No scleral icterus.  Neck: Neck supple. No tracheal deviation present.  Cardiovascular: Normal rate, regular rhythm and intact distal pulses.   Pulmonary/Chest: Effort normal and breath sounds normal. No stridor. No respiratory distress. He has no wheezes. He has no rales.  Abdominal: Soft. Bowel sounds are normal. He exhibits no distension. There is no tenderness. There is no rebound and no guarding. A hernia is present. Hernia confirmed positive in the left inguinal area ( appreciated on standing and with cough).  Absent when supine, no gaurding  Musculoskeletal: He exhibits no edema or tenderness.  Neurological: He is alert. He has normal strength. No cranial nerve deficit (no facial droop, extraocular movements intact, no slurred speech) or sensory deficit. He exhibits normal muscle tone. He displays no seizure activity.  Coordination normal.  Skin: Skin is warm and dry. No rash noted.  Psychiatric: He has a normal mood and affect.  Nursing note and vitals reviewed.    ED Treatments / Results  Labs (all labs ordered are listed, but only abnormal results are displayed) Labs Reviewed  CBC WITH DIFFERENTIAL/PLATELET - Abnormal; Notable for the following:       Result Value   RBC 4.08 (*)    Hemoglobin 12.7 (*)    HCT 37.6 (*)    Lymphs Abs 0.6 (*)    All other components within normal limits  BASIC METABOLIC PANEL - Abnormal; Notable for the following:    BUN 23 (*)    Creatinine, Ser 1.29 (*)    GFR calc non Af Amer 48 (*)    GFR calc Af Amer 56 (*)    All other components within normal limits   Procedures Procedures (including critical care time)  Medications Ordered in ED Medications - No data to display   Initial Impression / Assessment and Plan / ED Course  I have reviewed the triage vital signs and the nursing notes.  Pertinent labs & imaging results that were available during my care of the patient were reviewed by me and considered in my medical decision making (see chart for details).  Clinical Course as of Aug 08 1903  Tue Aug 07, 2016  1832 Waiting to discuss with surgery.  Labs stable.  He is comfortable.   [JK]    Clinical Course User Index [JK] Dorie Rank, MD    The patient presented to the emergency room for evaluation of an inguinal hernia. I spoke with Dr. Laurance Flatten. He was concerned with the patient's recurrent symptoms and that he may need more urgent surgery. Patient was referred for an outpatient evaluation but was not sure that he could wait that long.  Patient does not have any evidence of incarceration in the emergency room. He is not having any pain. He denies any nausea or vomiting. I do appreciate the patient's hernia when he is standing with Valsalva maneuver. I spoke with Dr. Hassell Done. He was in the middle of a surgical procedure. The patient can follow-up in the surgery  office tomorrow. He recommends that the family call tomorrow  to be seen.  I do not think the patient requires any emergent surgical procedure. We discussed warning signs and precautions. Patient is stable for outpatient follow-up. Final Clinical Impressions(s) / ED Diagnoses   Final diagnoses:  Left inguinal hernia    New Prescriptions New Prescriptions   No medications on file     Dorie Rank, MD 08/07/16 1905

## 2016-08-07 NOTE — Progress Notes (Signed)
Subjective:    Patient ID: [name], male    DOB: 09-06-1926, 82 y.o.   MRN: [id number]  HPI  Pt is here today for urinary frequency intermitently with LLQ pain. Pt went to [facility] on Friday.This pain started on Friday. It is in the left lower quadrant of his abdomen. He is had urinary frequency associated with this. To urgent care and did have a urinalysis and a urine culture and all of this was clear. He ultimately got an ultrasound and the ultrasound did not show any hernia. He started having more pain today and he comes to the office for further evaluation. He denies any chest pain shortness of breath trouble with his bowels other than some constipation.       Patient Active Problem List   Diagnosis Date Noted  . Neuropathy ([place]) 05/25/2016  . Aortic atherosclerosis ([place]) 05/25/2016  . Primary osteoarthritis of left knee 09/20/2015  . Anemia, chronic renal failure 04/15/2015  . Edema 04/12/2015  . Hyperbilirubinemia   . Choledocholithiasis 04/06/2015  . Hypoxia   . Abdominal pain, epigastric   . Abnormal liver enzymes   . Abnormal LFTs   . Biliary sepsis   . Acute respiratory failure ([place])   . Severe sepsis ([place])   . Bacteremia due to Gram-negative bacteria   . Hiatal hernia   . Sepsis ([place]) 03/28/2015  . Incisional hernia, without obstruction or gangrene 08/17/2014  . Atrial flutter ([place]) 07/28/2014  . Palpitation 06/16/2014  . Right knee DJD 03/11/2014  . S/P CABG x 4 09/03/2013  . Insomnia 07/27/2013  . Testosterone deficiency 07/27/2013  . Hyperlipidemia 06/10/2013  . Heart murmur 06/10/2013  . Dyspnea 06/10/2013  . Inguinal hernia, left. 04/04/2012  . Paraesophageal hiatal hernia 03/24/2012  . Prostate cancer ([place]) 01/15/2012  . Special screening for malignant neoplasms, colon 01/15/2012   Outpatient Encounter Prescriptions as of 08/07/2016  Medication Sig  . apixaban (ELIQUIS) 2.5 MG TABS tablet Take 1 tablet (2.5 mg total) by mouth 2  (two) times daily.  . Cholecalciferol (VITAMIN D3) 1000 UNITS CAPS Take 1,000 Units by mouth daily.   Marland Kitchen diltiazem (CARDIZEM CD) 240 MG 24 hr capsule Take 240 mg by mouth daily.  . furosemide (LASIX) 40 MG tablet Take 40 mg by mouth daily.  Marland Kitchen levothyroxine (SYNTHROID, LEVOTHROID) 25 MCG tablet TAKE 1 TABLET EVERY DAY BEFORE BREAKFAST  . mirabegron ER (MYRBETRIQ) 25 MG TB24 tablet Take 1 tablet (25 mg total) by mouth daily.  . nitroGLYCERIN (NITROSTAT) 0.4 MG SL tablet Place 1 tablet (0.4 mg total) under the tongue every 5 (five) minutes as needed for chest pain.  Marland Kitchen omeprazole (PRILOSEC) 40 MG capsule Take 1 capsule (40 mg total) by mouth daily.  Marland Kitchen oxybutynin (DITROPAN) 5 MG tablet Take 1 tablet (5 mg total) by mouth daily. (Patient taking differently: Take 5 mg by mouth daily after breakfast. )  . pramipexole (MIRAPEX) 1 MG tablet Take 1 mg by mouth at bedtime.  . pravastatin (PRAVACHOL) 40 MG tablet Take 1 tablet (40 mg total) by mouth daily.  . temazepam (RESTORIL) 7.5 MG capsule Take 1 capsule (7.5 mg total) by mouth at bedtime as needed for sleep.  . traZODone (DESYREL) 100 MG tablet Take 0.5 tablets (50 mg total) by mouth at bedtime as needed for sleep.   No facility-administered encounter medications on file as of 08/07/2016.                Review of Systems  Gastrointestinal: Positive for abdominal pain (LLQ, intermitent). Negative for nausea.  Genitourinary: Positive for frequency. Negative for difficulty urinating.       Objective:   Physical Exam  Constitutional: He is oriented to person, place, and time. He appears well-developed and well-nourished. No distress.  The patient is alert and is a good historian.  HENT:  Head: Normocephalic and atraumatic.  Mouth/Throat: No oropharyngeal exudate.  Eyes: Conjunctivae and EOM are normal. Pupils are equal, round, and reactive to light. Right eye exhibits no discharge. Left eye exhibits no discharge. No scleral icterus.    Neck: Normal range of motion. No thyromegaly present.  Cardiovascular: Normal rate, regular rhythm and normal heart sounds.  Exam reveals no friction rub.   No murmur heard. Pulmonary/Chest: Effort normal and breath sounds normal. No respiratory distress. He has no wheezes. He has no rales. He exhibits no tenderness.  Clear anteriorly and posteriorly  Abdominal: Soft. Bowel sounds are normal. He exhibits no mass. There is tenderness. There is no rebound and no guarding.  The patient describes pain in the left lower quadrant. On exam he appears to have bulging in the left lower quadrant. He is tender at this area. The area appears to be reducible. When he stands up and he is examined the bulges once again felt and this is the area where he is hurting. It appears that he has a direct inguinal hernia.  Genitourinary: Penis normal.  Musculoskeletal: Normal range of motion. He exhibits no edema.  Neurological: He is alert and oriented to person, place, and time.  Skin: Skin is warm and dry. No rash noted.  Psychiatric: He has a normal mood and affect. His behavior is normal. Judgment and thought content normal.  Nursing note and vitals reviewed.  BP (!) 166/82 (BP Location: Left Arm, Patient Position: Sitting, Cuff Size: Normal)   Pulse 88   Temp 97.4 F (36.3 C) (Oral)   Ht 5\' 10"  (1.778 m)   Wt 159 lb 6.4 oz (72.3 kg)   BMI 22.87 kg/m         Assessment & Plan:  1. Urine frequency - Urinalysis, Complete - Urine culture  2. Abdominal pain, left lower quadrant -The patient has a lot of gaseous distention in the upper abdomen and he has a bulge in the left lower quadrant of the abdomen. This is apparent with being supine and with standing.  3. Unilateral inguinal hernia without obstruction or gangrene, recurrence not specified -We will send the patient to the surgeon because of his pain.  Patient Instructions  We will do a referral to the surgeon for further evaluation of this left  inguinal hernia.  Arrie Senate MD   .

## 2016-08-07 NOTE — ED Triage Notes (Signed)
PT SENT HERE BY DR. Laurance Flatten FOR HERNIA REPAIR SURGERY. PT STS HE WAS SEEN ON Friday FOR  LLQ ABDOMINAL PAIN AND POSSIBLE HERNIA.  DENIES PAIN AT THIS TIME.

## 2016-08-07 NOTE — Patient Instructions (Addendum)
We will do a referral to the surgeon for further evaluation of this left inguinal hernia.  appt with [facility] [place] is on 08/24/16 at 9:20 am with Dr Brantley Stage.  Today - Go to the ER at [facility]  - we will call the triage nurse and let them know you are coming

## 2016-08-07 NOTE — Discharge Instructions (Signed)
Call tomorrow to see the surgeon in the office.  If the hernia bulge returns lie down and apply light pressure, return if the hernia does not go away or if you experience severe pain or vomiting

## 2016-08-09 ENCOUNTER — Telehealth: Payer: Self-pay | Admitting: Cardiology

## 2016-08-09 LAB — URINE CULTURE

## 2016-08-09 NOTE — Telephone Encounter (Signed)
Returned call to [facility] with [facility] surgery.Stated patient needs cardiac clearance for upcoming left inguinal repair.Also needs to know how long to hold Eliquis.Message sent to [place].

## 2016-08-09 NOTE — Telephone Encounter (Signed)
°  Follow Up   Calling to follow up on cardiac clearance faxed over yesterday 08/08/16 around 11:15 AM. Please call.

## 2016-08-09 NOTE — Telephone Encounter (Signed)
Patient has no high risk findings.  Therefore, based on ACC/AHA guidelines, the patient would be at acceptable risk for the planned procedure without further cardiovascular testing.  He can hold the Eliquis 2 days prior to the procedure.

## 2016-08-10 NOTE — Telephone Encounter (Signed)
Clearance fax via Epic to [facility]

## 2016-08-17 ENCOUNTER — Telehealth: Payer: Self-pay | Admitting: Family Medicine

## 2016-08-17 ENCOUNTER — Ambulatory Visit: Payer: Self-pay | Admitting: General Surgery

## 2016-08-17 NOTE — Telephone Encounter (Signed)
Patient wants [facility] to know he is getting his left inguinal surgery 10/02/16.

## 2016-08-19 NOTE — Telephone Encounter (Signed)
noted 

## 2016-08-21 ENCOUNTER — Other Ambulatory Visit: Payer: Self-pay | Admitting: Family

## 2016-08-21 ENCOUNTER — Other Ambulatory Visit: Payer: Self-pay | Admitting: Family Medicine

## 2016-08-23 ENCOUNTER — Ambulatory Visit: Payer: Self-pay | Admitting: General Surgery

## 2016-08-30 ENCOUNTER — Other Ambulatory Visit: Payer: Self-pay | Admitting: *Deleted

## 2016-08-30 MED ORDER — TRAZODONE HCL 100 MG PO TABS: 50.0000 mg | ORAL_TABLET | Freq: Every evening | ORAL | 1 refills | Status: DC | PRN

## 2016-09-04 ENCOUNTER — Other Ambulatory Visit: Payer: Self-pay | Admitting: *Deleted

## 2016-09-26 ENCOUNTER — Encounter (HOSPITAL_COMMUNITY)
Admission: RE | Admit: 2016-09-26 | Discharge: 2016-09-26 | Disposition: A | Discharge status: Home / Self Care | Payer: Medicare PPO | Source: Ambulatory Visit | Attending: General Surgery | Admitting: General Surgery

## 2016-09-26 ENCOUNTER — Other Ambulatory Visit (HOSPITAL_COMMUNITY): Payer: Self-pay | Admitting: *Deleted

## 2016-09-26 ENCOUNTER — Encounter (HOSPITAL_COMMUNITY): Payer: Self-pay

## 2016-09-26 DIAGNOSIS — K4090 Unilateral inguinal hernia, without obstruction or gangrene, not specified as recurrent: Secondary | ICD-10-CM | POA: Insufficient documentation

## 2016-09-26 DIAGNOSIS — Z01812 Encounter for preprocedural laboratory examination: Secondary | ICD-10-CM | POA: Diagnosis present

## 2016-09-26 LAB — BASIC METABOLIC PANEL
Anion gap: 9 (ref 5–15)
BUN: 18 mg/dL (ref 6–20)
CHLORIDE: 105 mmol/L (ref 101–111)
CO2: 24 mmol/L (ref 22–32)
CREATININE: 1.27 mg/dL — AB (ref 0.61–1.24)
Calcium: 10.1 mg/dL (ref 8.9–10.3)
GFR calc Af Amer: 57 mL/min — ABNORMAL LOW (ref >60)
GFR calc non Af Amer: 49 mL/min — ABNORMAL LOW (ref >60)
GLUCOSE: 73 mg/dL (ref 65–99)
Potassium: 4.3 mmol/L (ref 3.5–5.1)
Sodium: 138 mmol/L (ref 135–145)

## 2016-09-26 LAB — CBC
HEMATOCRIT: 38.8 % — AB (ref 39.0–52.0)
Hemoglobin: 13.1 g/dL (ref 13.0–17.0)
MCH: 31.6 pg (ref 26.0–34.0)
MCHC: 33.8 g/dL (ref 30.0–36.0)
MCV: 93.5 fL (ref 78.0–100.0)
PLATELETS: 190 10*3/uL (ref 150–400)
RBC: 4.15 MIL/uL — ABNORMAL LOW (ref 4.22–5.81)
RDW: 13.4 % (ref 11.5–15.5)
WBC: 8.6 10*3/uL (ref 4.0–10.5)

## 2016-09-26 NOTE — Pre-Procedure Instructions (Addendum)
[name]  09/26/2016    Your procedure is scheduled on Tuesday, October 02, 2016 .   Report to [facility] Entrance "A" Admitting Office at 5:30 AM.   Call this number if you have problems the morning of surgery: [phone]   Questions prior to day of surgery, please call [phone] between 8 & 4 PM.   Remember:  Do not eat food or drink liquids after midnight Monday, 10/01/16.  Take these medicines the morning of surgery with A SIP OF WATER: Diltiazem (Cardizem), Levothyroxine (Synthroid), Omeprazole (Prilosec), Tylenol - if needed  Stop Eliquis 2 days prior to surgery as instructed by cardiologist and surgeon(.Stop 09/30/16)  Do not use Aspirin products or NSAIDS (Ibuprofen, Motrin, Aleve, etc) prior to surgery.all vitamins/supplements   Do not wear jewelry.  Do not wear lotions, powders or cologne.  Men may shave face and neck.  Do not bring valuables to the hospital.  [facility] is not responsible for any belongings or valuables.  Contacts, dentures or bridgework may not be worn into surgery.  Leave your suitcase in the car.  After surgery it may be brought to your room.  For patients admitted to the hospital, discharge time will be determined by your treatment team.  Patients discharged the day of surgery will not be allowed to drive home.   Special instructions:  [facility] - Preparing for Surgery  Before surgery, you can play an important role.  Because skin is not sterile, your skin needs to be as free of germs as possible.  You can reduce the number of germs on you skin by washing with CHG (chlorahexidine gluconate) soap before surgery.  CHG is an antiseptic cleaner which kills germs and bonds with the skin to continue killing germs even after washing.  Please DO NOT use if you have an allergy to CHG or antibacterial soaps.  If your skin becomes reddened/irritated stop using the CHG and inform your nurse when you arrive at Short Stay.  Do not shave  (including legs and underarms) for at least 48 hours prior to the first CHG shower.  You may shave your face.  Please follow these instructions carefully:   1.  Shower with CHG Soap the night before surgery and the                    morning of Surgery.  2.  If you choose to wash your hair, wash your hair first as usual with your       normal shampoo.  3.  After you shampoo, rinse your hair and body thoroughly to remove the shampoo.  4.  Use CHG as you would any other liquid soap.  You can apply chg directly       to the skin and wash gently with scrungie or a clean washcloth.  5.  Apply the CHG Soap to your body ONLY FROM THE NECK DOWN.        Do not use on open wounds or open sores.  Avoid contact with your eyes, ears, mouth and genitals (private parts).  Wash genitals (private parts) with your normal soap.  6.  Wash thoroughly, paying special attention to the area where your surgery        will be performed.  7.  Thoroughly rinse your body with warm water from the neck down.  8.  DO NOT shower/wash with your normal soap after using and rinsing off  the CHG Soap.  9.  Pat yourself dry with a clean towel.            10.  Wear clean pajamas.            11.  Place clean sheets on your bed the night of your first shower and do not        sleep with pets.  Day of Surgery  Do not apply any lotions the morning of surgery.  Please wear clean clothes to the hospital.   Please read over the fact sheets that you were given.

## 2016-09-27 NOTE — Progress Notes (Signed)
Anesthesia Chart Review:  Pt is an 83 year old male scheduled for L inguinal hernia repair with mesh on 10/02/2016 with Georganna Skeans, MD.   - PCP is [name], MD - Cardiologist is Minus Breeding, MD. Pt has cardiac clearance for surgery from Dr. Percival Spanish in [place] telephone encounter dated 08/09/16.   PMH includes:  CAD (s/p CABG 09/04/13: LIMA to LAD, SVG to diagonal, sequential SVG to OM1 and OM2), PAF/atrial flutter, HTN, hyperlipidemia, CKD (stage 3), hypothyroidism, prostate cancer, GERD. Former smoker. BMI 23.   S/p L TKA 09/20/15. S/p ERCP 04/06/15 and 04/04/15. S/p R TKA 03/11/14.   Medications include: eliquis, diltiazem, lasix, levothyroxine, prilosec, pravastatin. Pt to stop eliquis 10/01/15.   Preoperative labs reviewed.    EKG 06/06/16: Sinus rhythm, rate 69, leftward axis, premature ventricular contractions, no acute ST-T wave changes.  Echo 03/30/15:  - Left ventricle: The cavity size was normal. Taul thickness was normal. Systolic function was normal. The estimated ejection fraction was in the range of 55% to 60%. Barella motion was normal;there were no regional Petite motion abnormalities. Features are consistent with a pseudonormal left ventricular filling pattern,with concomitant abnormal relaxation and increased filling pressure (grade 2 diastolic dysfunction). - Mitral valve: Calcified annulus. There was mild regurgitation. - Left atrium: The atrium was mildly to moderately dilated. - Right ventricle: The cavity size was mildly dilated. - Right atrium: The atrium was moderately dilated. - Tricuspid valve: There was moderate regurgitation. - Pulmonary arteries: Systolic pressure was moderately increased. PA peak pressure: 46 mm Hg (S).  Carotid duplex 09/01/13: No significant (1-39%) ICA stenosis noted.   If no changes, I anticipate pt can proceed with surgery as scheduled.   Willeen Cass, FNP-BC [facility]/Anesthesiology Phone:  [phone] 09/27/2016 1:47 PM

## 2016-10-02 ENCOUNTER — Encounter (HOSPITAL_COMMUNITY)
Admission: RE | Disposition: A | Discharge status: Home / Self Care | Payer: Self-pay | Source: Ambulatory Visit | Attending: General Surgery

## 2016-10-02 ENCOUNTER — Ambulatory Visit (HOSPITAL_COMMUNITY): Payer: Medicare PPO | Admitting: Certified Registered"

## 2016-10-02 ENCOUNTER — Encounter (HOSPITAL_COMMUNITY): Payer: Self-pay | Admitting: Certified Registered"

## 2016-10-02 ENCOUNTER — Ambulatory Visit (HOSPITAL_COMMUNITY): Payer: Medicare PPO | Admitting: Emergency Medicine

## 2016-10-02 ENCOUNTER — Observation Stay (HOSPITAL_COMMUNITY)
Admission: RE | Admit: 2016-10-02 | Discharge: 2016-10-03 | Disposition: A | Discharge status: Home / Self Care | Payer: Medicare PPO | Source: Ambulatory Visit | Attending: General Surgery | Admitting: General Surgery

## 2016-10-02 DIAGNOSIS — Z87891 Personal history of nicotine dependence: Secondary | ICD-10-CM | POA: Diagnosis not present

## 2016-10-02 DIAGNOSIS — Z8719 Personal history of other diseases of the digestive system: Secondary | ICD-10-CM

## 2016-10-02 DIAGNOSIS — Z9889 Other specified postprocedural states: Secondary | ICD-10-CM

## 2016-10-02 DIAGNOSIS — Z880 Allergy status to penicillin: Secondary | ICD-10-CM | POA: Diagnosis not present

## 2016-10-02 DIAGNOSIS — I2510 Atherosclerotic heart disease of native coronary artery without angina pectoris: Secondary | ICD-10-CM | POA: Diagnosis not present

## 2016-10-02 DIAGNOSIS — Z888 Allergy status to other drugs, medicaments and biological substances status: Secondary | ICD-10-CM | POA: Insufficient documentation

## 2016-10-02 DIAGNOSIS — I10 Essential (primary) hypertension: Secondary | ICD-10-CM | POA: Diagnosis not present

## 2016-10-02 DIAGNOSIS — Z79899 Other long term (current) drug therapy: Secondary | ICD-10-CM | POA: Insufficient documentation

## 2016-10-02 DIAGNOSIS — K4090 Unilateral inguinal hernia, without obstruction or gangrene, not specified as recurrent: Principal | ICD-10-CM | POA: Insufficient documentation

## 2016-10-02 DIAGNOSIS — I4891 Unspecified atrial fibrillation: Secondary | ICD-10-CM | POA: Insufficient documentation

## 2016-10-02 DIAGNOSIS — N3281 Overactive bladder: Secondary | ICD-10-CM | POA: Insufficient documentation

## 2016-10-02 DIAGNOSIS — Z951 Presence of aortocoronary bypass graft: Secondary | ICD-10-CM | POA: Diagnosis not present

## 2016-10-02 HISTORY — PX: INGUINAL HERNIA REPAIR: SHX194

## 2016-10-02 HISTORY — PX: INGUINAL HERNIA REPAIR: SUR1180

## 2016-10-02 HISTORY — PX: INSERTION OF MESH: SHX5868

## 2016-10-02 LAB — PROTIME-INR
INR: 1.08
Prothrombin Time: 14 seconds (ref 11.4–15.2)

## 2016-10-02 SURGERY — REPAIR, HERNIA, INGUINAL, ADULT
Anesthesia: General | Site: Groin | Laterality: Left

## 2016-10-02 MED ORDER — NITROGLYCERIN 0.4 MG SL SUBL: 0.4000 mg | SUBLINGUAL_TABLET | SUBLINGUAL | Status: DC | PRN

## 2016-10-02 MED ORDER — CHLORHEXIDINE GLUCONATE CLOTH 2 % EX PADS: 6.0000 | MEDICATED_PAD | Freq: Once | CUTANEOUS | Status: DC

## 2016-10-02 MED ORDER — ACETAMINOPHEN 325 MG PO TABS: 325.0000 mg | ORAL_TABLET | Freq: Four times a day (QID) | ORAL | Status: DC | PRN

## 2016-10-02 MED ORDER — MIDAZOLAM HCL 2 MG/2ML IJ SOLN
INTRAMUSCULAR | Status: AC
Start: 2016-10-02 — End: 2016-10-02
  Filled 2016-10-02: qty 2

## 2016-10-02 MED ORDER — BUPIVACAINE-EPINEPHRINE (PF) 0.25% -1:200000 IJ SOLN
INTRAMUSCULAR | Status: DC | PRN
  Administered 2016-10-02: 30 mL

## 2016-10-02 MED ORDER — LACTATED RINGERS IV SOLN
INTRAVENOUS | Status: DC | PRN
  Administered 2016-10-02 (×2): via INTRAVENOUS

## 2016-10-02 MED ORDER — TRAZODONE HCL 50 MG PO TABS
50.0000 mg | ORAL_TABLET | Freq: Every day | ORAL | Status: DC
  Administered 2016-10-02: 50 mg via ORAL
  Filled 2016-10-02: qty 1

## 2016-10-02 MED ORDER — PROPOFOL 10 MG/ML IV BOLUS
INTRAVENOUS | Status: AC
  Filled 2016-10-02: qty 20

## 2016-10-02 MED ORDER — FENTANYL CITRATE (PF) 100 MCG/2ML IJ SOLN
INTRAMUSCULAR | Status: DC | PRN
  Administered 2016-10-02: 25 ug via INTRAVENOUS
  Administered 2016-10-02: 50 ug via INTRAVENOUS
  Administered 2016-10-02: 25 ug via INTRAVENOUS

## 2016-10-02 MED ORDER — BUPIVACAINE HCL 0.25 % IJ SOLN
INTRAMUSCULAR | Status: DC | PRN
  Administered 2016-10-02: 20 mL

## 2016-10-02 MED ORDER — MEPERIDINE HCL 25 MG/ML IJ SOLN: 6.2500 mg | INTRAMUSCULAR | Status: DC | PRN

## 2016-10-02 MED ORDER — ROCURONIUM BROMIDE 50 MG/5ML IV SOSY
PREFILLED_SYRINGE | INTRAVENOUS | Status: AC
  Filled 2016-10-02: qty 5

## 2016-10-02 MED ORDER — 0.9 % SODIUM CHLORIDE (POUR BTL) OPTIME
TOPICAL | Status: DC | PRN
  Administered 2016-10-02: 1000 mL

## 2016-10-02 MED ORDER — LEVOTHYROXINE SODIUM 25 MCG PO TABS
25.0000 ug | ORAL_TABLET | Freq: Every day | ORAL | Status: DC
  Administered 2016-10-03: 25 ug via ORAL
  Filled 2016-10-02: qty 1

## 2016-10-02 MED ORDER — PROPOFOL 10 MG/ML IV BOLUS
INTRAVENOUS | Status: DC | PRN
  Administered 2016-10-02: 130 mg via INTRAVENOUS

## 2016-10-02 MED ORDER — METOCLOPRAMIDE HCL 5 MG/ML IJ SOLN: 10.0000 mg | Freq: Once | INTRAMUSCULAR | Status: DC | PRN

## 2016-10-02 MED ORDER — EPHEDRINE SULFATE-NACL 50-0.9 MG/10ML-% IV SOSY
PREFILLED_SYRINGE | INTRAVENOUS | Status: DC | PRN
  Administered 2016-10-02 (×7): 5 mg via INTRAVENOUS

## 2016-10-02 MED ORDER — OXYBUTYNIN CHLORIDE 5 MG PO TABS
5.0000 mg | ORAL_TABLET | Freq: Every day | ORAL | Status: DC
  Administered 2016-10-02 – 2016-10-03 (×2): 5 mg via ORAL
  Filled 2016-10-02 (×2): qty 1

## 2016-10-02 MED ORDER — DEXAMETHASONE SODIUM PHOSPHATE 10 MG/ML IJ SOLN
INTRAMUSCULAR | Status: DC | PRN
  Administered 2016-10-02: 10 mg via INTRAVENOUS

## 2016-10-02 MED ORDER — DIPHENHYDRAMINE HCL 12.5 MG/5ML PO ELIX: 12.5000 mg | ORAL_SOLUTION | Freq: Four times a day (QID) | ORAL | Status: DC | PRN

## 2016-10-02 MED ORDER — DIPHENHYDRAMINE HCL 50 MG/ML IJ SOLN: 12.5000 mg | Freq: Four times a day (QID) | INTRAMUSCULAR | Status: DC | PRN

## 2016-10-02 MED ORDER — ONDANSETRON 4 MG PO TBDP: 4.0000 mg | ORAL_TABLET | Freq: Four times a day (QID) | ORAL | Status: DC | PRN

## 2016-10-02 MED ORDER — FENTANYL CITRATE (PF) 100 MCG/2ML IJ SOLN: 25.0000 ug | INTRAMUSCULAR | Status: DC | PRN

## 2016-10-02 MED ORDER — CIPROFLOXACIN IN D5W 400 MG/200ML IV SOLN
400.0000 mg | INTRAVENOUS | Status: AC
  Administered 2016-10-02: 400 mg via INTRAVENOUS
  Filled 2016-10-02: qty 200

## 2016-10-02 MED ORDER — LIDOCAINE 2% (20 MG/ML) 5 ML SYRINGE
INTRAMUSCULAR | Status: AC
  Filled 2016-10-02: qty 5

## 2016-10-02 MED ORDER — METHOCARBAMOL 500 MG PO TABS
500.0000 mg | ORAL_TABLET | Freq: Four times a day (QID) | ORAL | Status: DC | PRN
  Administered 2016-10-02: 500 mg via ORAL
  Filled 2016-10-02 (×2): qty 1

## 2016-10-02 MED ORDER — DILTIAZEM HCL ER COATED BEADS 240 MG PO CP24
240.0000 mg | ORAL_CAPSULE | Freq: Every day | ORAL | Status: DC
Start: 2016-10-03 — End: 2016-10-03
  Administered 2016-10-03: 240 mg via ORAL
  Filled 2016-10-02: qty 1

## 2016-10-02 MED ORDER — LIDOCAINE HCL (CARDIAC) 20 MG/ML IV SOLN
INTRAVENOUS | Status: DC | PRN
  Administered 2016-10-02: 100 mg via INTRAVENOUS

## 2016-10-02 MED ORDER — PRAMIPEXOLE DIHYDROCHLORIDE 1 MG PO TABS
1.0000 mg | ORAL_TABLET | Freq: Every day | ORAL | Status: DC
  Administered 2016-10-02: 1 mg via ORAL
  Filled 2016-10-02 (×2): qty 1

## 2016-10-02 MED ORDER — PANTOPRAZOLE SODIUM 40 MG IV SOLR
40.0000 mg | Freq: Every day | INTRAVENOUS | Status: DC
  Administered 2016-10-02: 40 mg via INTRAVENOUS
  Filled 2016-10-02: qty 40

## 2016-10-02 MED ORDER — ONDANSETRON HCL 4 MG/2ML IJ SOLN
INTRAMUSCULAR | Status: DC | PRN
  Administered 2016-10-02: 4 mg via INTRAVENOUS

## 2016-10-02 MED ORDER — TRAMADOL HCL 50 MG PO TABS
50.0000 mg | ORAL_TABLET | Freq: Four times a day (QID) | ORAL | Status: DC | PRN
  Administered 2016-10-02 (×2): 50 mg via ORAL
  Filled 2016-10-02 (×2): qty 1

## 2016-10-02 MED ORDER — ONDANSETRON HCL 4 MG/2ML IJ SOLN: 4.0000 mg | Freq: Four times a day (QID) | INTRAMUSCULAR | Status: DC | PRN

## 2016-10-02 MED ORDER — OXYMETAZOLINE HCL 0.05 % NA SOLN
1.0000 | Freq: Two times a day (BID) | NASAL | Status: DC | PRN
  Filled 2016-10-02: qty 15

## 2016-10-02 MED ORDER — ALUM & MAG HYDROXIDE-SIMETH 200-200-20 MG/5ML PO SUSP
15.0000 mL | ORAL | Status: DC | PRN
  Administered 2016-10-02: 15 mL via ORAL
  Filled 2016-10-02 (×2): qty 30

## 2016-10-02 MED ORDER — BUPIVACAINE HCL (PF) 0.25 % IJ SOLN
INTRAMUSCULAR | Status: AC
  Filled 2016-10-02: qty 30

## 2016-10-02 MED ORDER — FENTANYL CITRATE (PF) 100 MCG/2ML IJ SOLN
INTRAMUSCULAR | Status: AC
  Filled 2016-10-02: qty 2

## 2016-10-02 MED ORDER — FUROSEMIDE 40 MG PO TABS
40.0000 mg | ORAL_TABLET | Freq: Every day | ORAL | Status: DC
  Administered 2016-10-02 – 2016-10-03 (×2): 40 mg via ORAL
  Filled 2016-10-02 (×2): qty 1

## 2016-10-02 MED ORDER — MORPHINE SULFATE (PF) 2 MG/ML IV SOLN
2.0000 mg | INTRAVENOUS | Status: DC | PRN
  Administered 2016-10-02: 2 mg via INTRAVENOUS
  Filled 2016-10-02: qty 1

## 2016-10-02 SURGICAL SUPPLY — 46 items
BLADE SURG 10 STRL SS (BLADE) ×3 IMPLANT
BLADE SURG 15 STRL LF DISP TIS (BLADE) ×2 IMPLANT
BLADE SURG 15 STRL SS (BLADE) ×1
BLADE SURG ROTATE 9660 (MISCELLANEOUS) IMPLANT
CANISTER SUCTION 2500CC (MISCELLANEOUS) ×3 IMPLANT
CHLORAPREP W/TINT 26ML (MISCELLANEOUS) ×3 IMPLANT
COVER SURGICAL LIGHT HANDLE (MISCELLANEOUS) ×3 IMPLANT
DERMABOND ADVANCED (GAUZE/BANDAGES/DRESSINGS) ×1
DERMABOND ADVANCED .7 DNX12 (GAUZE/BANDAGES/DRESSINGS) ×2 IMPLANT
DRAIN PENROSE 1/2X12 LTX STRL (WOUND CARE) IMPLANT
DRAPE LAPAROTOMY 100X72 PEDS (DRAPES) ×3 IMPLANT
DRAPE UTILITY XL STRL (DRAPES) ×6 IMPLANT
ELECT CAUTERY BLADE 6.4 (BLADE) ×3 IMPLANT
ELECT REM PT RETURN 9FT ADLT (ELECTROSURGICAL) ×3
ELECTRODE REM PT RTRN 9FT ADLT (ELECTROSURGICAL) ×2 IMPLANT
GLOVE BIO SURGEON STRL SZ8 (GLOVE) ×3 IMPLANT
GLOVE BIOGEL PI IND STRL 8 (GLOVE) ×2 IMPLANT
GLOVE BIOGEL PI INDICATOR 8 (GLOVE) ×1
GOWN STRL REUS W/ TWL LRG LVL3 (GOWN DISPOSABLE) ×2 IMPLANT
GOWN STRL REUS W/ TWL XL LVL3 (GOWN DISPOSABLE) ×2 IMPLANT
GOWN STRL REUS W/TWL LRG LVL3 (GOWN DISPOSABLE) ×1
GOWN STRL REUS W/TWL XL LVL3 (GOWN DISPOSABLE) ×1
KIT BASIN OR (CUSTOM PROCEDURE TRAY) ×3 IMPLANT
KIT ROOM TURNOVER OR (KITS) ×3 IMPLANT
MESH HERNIA 3X6 (Mesh General) ×3 IMPLANT
NEEDLE 22X1 1/2 (OR ONLY) (NEEDLE) ×3 IMPLANT
NS IRRIG 1000ML POUR BTL (IV SOLUTION) ×3 IMPLANT
PACK SURGICAL SETUP 50X90 (CUSTOM PROCEDURE TRAY) ×3 IMPLANT
PAD ARMBOARD 7.5X6 YLW CONV (MISCELLANEOUS) ×3 IMPLANT
PENCIL BUTTON HOLSTER BLD 10FT (ELECTRODE) ×3 IMPLANT
PLUG ATRIUM AND PATCH X LRG (MISCELLANEOUS) IMPLANT
SPECIMEN JAR SMALL (MISCELLANEOUS) IMPLANT
SPONGE LAP 18X18 X RAY DECT (DISPOSABLE) ×3 IMPLANT
SUT MNCRL AB 4-0 PS2 18 (SUTURE) ×3 IMPLANT
SUT PROLENE 2 0 CT2 30 (SUTURE) ×9 IMPLANT
SUT VIC AB 2-0 SH 27 (SUTURE) ×1
SUT VIC AB 2-0 SH 27X BRD (SUTURE) ×2 IMPLANT
SUT VIC AB 3-0 SH 27 (SUTURE) ×1
SUT VIC AB 3-0 SH 27X BRD (SUTURE) ×2 IMPLANT
SUT VICRYL AB 3 0 TIES (SUTURE) IMPLANT
SYR BULB 3OZ (MISCELLANEOUS) ×3 IMPLANT
SYR CONTROL 10ML LL (SYRINGE) ×3 IMPLANT
TOWEL OR 17X24 6PK STRL BLUE (TOWEL DISPOSABLE) ×3 IMPLANT
TOWEL OR 17X26 10 PK STRL BLUE (TOWEL DISPOSABLE) ×3 IMPLANT
TUBE CONNECTING 12X1/4 (SUCTIONS) ×3 IMPLANT
YANKAUER SUCT BULB TIP NO VENT (SUCTIONS) ×3 IMPLANT

## 2016-10-02 NOTE — Op Note (Signed)
10/02/2016  8:54 AM  PATIENT:  [name]  83 y.o. male  PRE-OPERATIVE DIAGNOSIS:  Left inguinal hernia  POST-OPERATIVE DIAGNOSIS:  Left inguinal hernia  PROCEDURE:  Procedure(s): REPAIR LEFT INGUINAL HERNIA WITH MESH INSERTION OF MESH  SURGEON:  Surgeon(s): Georganna Skeans, MD  ASSISTANTS: none   ANESTHESIA:   local, regional and general  EBL:  Total I/O In: 1000 [I.V.:1000] Out: 20 [Blood:20]  BLOOD ADMINISTERED:none  DRAINS: none   SPECIMEN:  No Specimen  DISPOSITION OF SPECIMEN:  N/A  COUNTS:  YES  DICTATION: .Dragon Dictation Findings: Direct hernia  Procedure in detail: Mr. Hackman presents for left inguinal hernia repair with mesh. He was identified in the preop holding area. Informed consent was obtained. His site was marked. He received intravenous antibiotics. TAP block was performed by anesthesia. He was brought to the operating room. General anesthesia was administered by the anesthesia staff. His lower abdomen and groins were prepped and draped in sterile fashion. We did a time out procedure. Local was injected along the planned line of incision of the left inguinal region. Left inguinal incision was made. Subcutaneous tissues were dissected down through Scarpa's fascia revealing the external oblique fascia. This was attenuated medially due to the hernia. It was divided out laterally in the division was continued to the ring. The superior leaflet was dissected free off the transversalis. There was a lot of scar tissue there. The inferior leaflet was dissected down revealing the shelving edge of inguinal ligament. Cord structures were encircled with a Penrose drain. There was no indirect hernia. There is a small medial direct hernia which reduced easily. The hernia was then repaired with a keyhole polypropylene mesh cut to custom size and shape. This was tacked to the tissues over the pubic tubercle medially with 2-0 Prolene. It was then sutured inferiorly along the  shelving edge of inguinal ligament with 2-0 Prolene in a running fashion. The superior portion was again tacked to the tissues over the pubic tubercle with interrupted 2-0 Prolene and intact to the transversalis with interrupted 2-0 Prolene. The 2 leaflets of the mesh were rejoined behind the cord and tucked out laterally underneath the external oblique fascia. These were then tacked together and to underlying tissues with interrupted 2-0 Prolene. The area was copiously irrigated. Hemostasis was ensured. What was left of the external oblique fascia was closed with running 2-0 Vicryl. Scarpa's fascia was closed with interrupted 3-0 Vicryl. The skin was closed with running 4-0 Monocryl subcuticular stitch followed by Dermabond. All counts were correct. He tolerated the procedure well without apparent complication. His left testicle was relocated to anatomic position in the scrotum. He was taken recovery in stable condition. PATIENT DISPOSITION:  PACU - hemodynamically stable.   Delay start of Pharmacological VTE agent (>24hrs) due to surgical blood loss or risk of bleeding:  no  Georganna Skeans, MD, MPH, FACS Pager: [phone]  2/13/20188:54 AM

## 2016-10-02 NOTE — Interval H&P Note (Signed)
History and Physical Interval Note:  10/02/2016 6:52 AM  [name]  has presented today for surgery, with the diagnosis of Left inguinal hernia  The various methods of treatment have been discussed with the patient and family. After consideration of risks, benefits and other options for treatment, the patient has consented to  Procedure(s): REPAIR LEFT INGUINAL HERNIA WITH MESH (Left) INSERTION OF MESH (Left) as a surgical intervention .  The patient's history has been reviewed, patient examined, site marked, no change in status, stable for surgery.  I have reviewed the patient's chart and labs.  Questions were answered to the patient's satisfaction.     [name] E

## 2016-10-02 NOTE — Transfer of Care (Signed)
Immediate Anesthesia Transfer of Care Note  Patient: [name]  Procedure(s) Performed: Procedure(s): REPAIR LEFT INGUINAL HERNIA WITH MESH (Left) INSERTION OF MESH (Left)  Patient Location: PACU  Anesthesia Type:General and Regional  Level of Consciousness: awake, alert  and oriented  Airway & Oxygen Therapy: Patient Spontanous Breathing and Patient connected to nasal cannula oxygen  Post-op Assessment: Report given to RN, Post -op Vital signs reviewed and stable and Patient moving all extremities X 4  Post vital signs: Reviewed and stable  Last Vitals:  Vitals:   10/02/16 0552 10/02/16 0851  BP: (!) 155/66   Pulse: 64   Resp: 18   Temp: 36.6 C (P) 36.2 C   HR 71, BP 148/473, RR 15, Sats 95% on 2L [place]  Last Pain:  Vitals:   10/02/16 0552  TempSrc: Oral         Complications: No apparent anesthesia complications

## 2016-10-02 NOTE — H&P (Signed)
[place] 08/08/2016 10:39 AM Location: [facility] Patient #: [id number] DOB: 12/23/1926 Widowed / Language: [name] / Race: White Male   History of Present Illness [name] E. Grandville Silos MD; 08/08/2016 10:59 AM) The patient is a 83 year old male who presents with an inguinal hernia. I was asked to see [name] by Dr. Redge [name] in consultation for a left inguinal hernia. He has noticed intermittent swelling and discomfort in his left groin for several months. It has been evaluated by a few different providers. He was seen at an urgent care and that examination was unrevealing. Dr. Laurance [name] saw him yesterday and diagnosed him with a hernia. He was referred to the emergency Department at [facility]. Dr. Marye [name] saw him at that time and his hernia was reduced. Ultrasound was also done not demonstrating any hernia. I'm seeing him today. He has felt better since yesterday. Of note, he has a history of choledocholithiasis in 2016. Subsequent follow-up ultrasound demonstrating no gallstones was obtained. His cardiologist is Dr. Marijo [name] at [facility]. He is S/P CABG in the past. He is on Eliquis and last saw Dr. Percival [name] 06/06/16.   Allergies [name], [name]; 08/08/2016 10:41 AM) Lipitor *ANTIHYPERLIPIDEMICS*  Penicillin V *PENICILLINS*   Medication History [name], CMA; 08/08/2016 10:41 AM) Eliquis (2.5MG  Tablet, Oral) Active. DiltiaZEM HCl ER Coated Beads (240MG  Capsule ER 24HR, Oral) Active. Furosemide (40MG  Tablet, Oral) Active. Levothyroxine Sodium (25MCG Tablet, Oral) Active. Omeprazole (40MG  Capsule DR, Oral) Active. Oxybutynin Chloride (5MG  Tablet, Oral) Active. Potassium Chloride Crys ER (10MEQ Tablet ER, Oral) Active. Pramipexole Dihydrochloride (1MG  Tablet, Oral) Active. Pravastatin Sodium (40MG  Tablet, Oral) Active. Temazepam (15MG  Capsule, Oral) Active. Myrbetriq (25MG  Tablet ER 24HR, Oral daily) Active. Nitroglycerin  (0.4MG  Tab Sublingual, Sublingual daily) Active. TraZODone HCl (100MG  Tablet, Oral as needed) Active. Medications Reconciled  Vitals Bary Castilla [name] CMA; 08/08/2016 10:42 AM) 08/08/2016 10:41 AM Weight: 156 lb Height: 70in Body Surface Area: 1.88 m Body Mass Index: 22.38 kg/m  Temp.: 98.89F  Pulse: 72 (Regular)  BP: 148/82 (Sitting, Left Arm, Standard)       Physical Exam [name] E. Grandville Silos MD; 08/08/2016 11:02 AM) General Note: No distress   Integumentary Note: Warm and dry, no rashes   Head and Neck Note: Normocephalic, neck is supple with no masses felt   Eye Note: Wears glasses, pupils equal and reactive, extraocular muscles intact   ENMT Note: Oral mucosa is moist   Chest and Lung Exam Note: Clear to auscultation bilaterally, scar posterior right chest status post pleural drainage as a child, no rales, no wheezing   Cardiovascular Note: Regular rate and rhythm, distal pulses intact, mild peripheral edema   Abdomen Note: Soft, small incisional hernia near his xiphoid reduces easily, no other masses or tenderness   Male Genitourinary Note: Right groin demonstrates scar, no right inguinal hernia, both testes are descended, left inguinal hernia is present, it is moderate in size, it does not extend into the scrotum and easily reduces and does not spontaneously recur   Neurologic Note: Alert, oriented, speech fluent, gait normal   Musculoskeletal Note: No deformity or tenderness, well-developed, bilateral knee scars   Lymphatic Note: No cervical, supraclavicular, or inguinal lymphadenopathy     Assessment & Plan [name] E. Grandville Silos MD; 08/08/2016 11:04 AM) INGUINAL HERNIA OF LEFT SIDE WITHOUT OBSTRUCTION OR GANGRENE (K40.90) Impression: I have offered repair with mesh. Procedure, risks, and benefits were discussed with him. We will plan 23 hour observation with telemetry at [facility].  He needs preoperative clearance  from Dr. Percival [name] both regarding risk stratification for surgery and ability to hold Eliquis. Once we receive clearance I will proceed with scheduling. I discussed the expected Postoperative course. He is agreeable.   Received cardiac clearance. He has held his Eliquis.  Scott Skeans, MD, MPH, FACS Trauma: [phone] General Surgery: [phone]

## 2016-10-02 NOTE — Anesthesia Procedure Notes (Signed)
Procedure Name: Intubation Date/Time: 10/02/2016 7:28 AM Performed by: Merrilyn Puma B Pre-anesthesia Checklist: Patient identified, Emergency Drugs available, Suction available, Patient being monitored and Timeout performed Patient Re-evaluated:Patient Re-evaluated prior to inductionOxygen Delivery Method: Circle system utilized Preoxygenation: Pre-oxygenation with 100% oxygen Intubation Type: IV induction Ventilation: Mask ventilation without difficulty LMA: LMA inserted LMA Size: 5.0 Laryngoscope Size: Mac and 4 Grade View: Grade I Tube type: Oral Tube size: 7.5 mm Number of attempts: 1 Airway Equipment and Method: Stylet Placement Confirmation: ETT inserted through vocal cords under direct vision,  positive ETCO2,  CO2 detector and breath sounds checked- equal and bilateral Secured at: 22 cm Tube secured with: Tape Dental Injury: Teeth and Oropharynx as per pre-operative assessment  Comments: LMA 4 placed - large air leak present.  Intubated with 7.5 ETT.  Atraumatic insertion.

## 2016-10-02 NOTE — Anesthesia Postprocedure Evaluation (Signed)
Anesthesia Post Note  Patient: [name]  Procedure(s) Performed: Procedure(s) (LRB): REPAIR LEFT INGUINAL HERNIA WITH MESH (Left) INSERTION OF MESH (Left)  Patient location during evaluation: PACU Anesthesia Type: General and Regional Level of consciousness: awake and alert Pain management: pain level controlled Vital Signs Assessment: post-procedure vital signs reviewed and stable Respiratory status: spontaneous breathing, nonlabored ventilation, respiratory function stable and patient connected to nasal cannula oxygen Cardiovascular status: blood pressure returned to baseline and stable Postop Assessment: no signs of nausea or vomiting Anesthetic complications: no       Last Vitals:  Vitals:   10/02/16 0923 10/02/16 1000  BP: 119/66   Pulse: 63 63  Resp: 17 18  Temp:      Last Pain:  Vitals:   10/02/16 0552  TempSrc: Oral                 Montez Hageman

## 2016-10-02 NOTE — Anesthesia Preprocedure Evaluation (Addendum)
Anesthesia Evaluation  Patient identified by MRN, date of birth, ID band Patient awake    Reviewed: Allergy & Precautions, NPO status , Patient's Chart, lab work & pertinent test results  Airway Mallampati: II  TM Distance: >3 FB Neck ROM: Full    Dental no notable dental hx. (+) Upper Dentures   Pulmonary neg pulmonary ROS, former smoker,    Pulmonary exam normal breath sounds clear to auscultation       Cardiovascular hypertension, Pt. on medications + CAD and + CABG (2014)  Normal cardiovascular exam+ dysrhythmias Atrial Fibrillation  Rhythm:Irregular Rate:Normal     Neuro/Psych negative neurological ROS  negative psych ROS   GI/Hepatic Neg liver ROS, hiatal hernia,   Endo/Other  negative endocrine ROS  Renal/GU Renal InsufficiencyRenal diseasenegative Renal ROS  negative genitourinary   Musculoskeletal negative musculoskeletal ROS (+)   Abdominal   Peds negative pediatric ROS (+)  Hematology negative hematology ROS (+)   Anesthesia Other Findings   Reproductive/Obstetrics negative OB ROS                            Anesthesia Physical Anesthesia Plan  ASA: III  Anesthesia Plan: General   Post-op Pain Management:  Regional for Post-op pain   Induction: Intravenous  Airway Management Planned: LMA and Oral ETT  Additional Equipment:   Intra-op Plan:   Post-operative Plan: Extubation in OR  Informed Consent: I have reviewed the patients History and Physical, chart, labs and discussed the procedure including the risks, benefits and alternatives for the proposed anesthesia with the patient or authorized representative who has indicated his/her understanding and acceptance.   Dental advisory given  Plan Discussed with: CRNA  Anesthesia Plan Comments: (TAP block)        Anesthesia Quick Evaluation

## 2016-10-02 NOTE — Anesthesia Procedure Notes (Signed)
Anesthesia Regional Block:  TAP block  Pre-Anesthetic Checklist: ,, timeout performed, Correct Patient, Correct Site, Correct Laterality, Correct Procedure, Correct Position, site marked, Risks and benefits discussed,  Surgical consent,  Pre-op evaluation,  At surgeon's request and post-op pain management  Laterality: Left  Prep: Maximum Sterile Barrier Precautions used, chloraprep       Needles:  Injection technique: Single-shot  Needle Type: Echogenic Stimulator Needle     Needle Length: 10cm 10 cm Needle Gauge: 21 G    Additional Needles:  Procedures: ultrasound guided (picture in chart) TAP block Narrative:  Start time: 10/02/2016 7:08 AM End time: 10/02/2016 7:17 AM Injection made incrementally with aspirations every 5 mL.  Performed by: Personally  Anesthesiologist: Montez Hageman  Additional Notes: Risks, benefits and alternative to block explained extensively.  Patient tolerated procedure well, without complications.

## 2016-10-03 ENCOUNTER — Encounter (HOSPITAL_COMMUNITY): Payer: Self-pay | Admitting: General Surgery

## 2016-10-03 DIAGNOSIS — K4090 Unilateral inguinal hernia, without obstruction or gangrene, not specified as recurrent: Secondary | ICD-10-CM | POA: Diagnosis not present

## 2016-10-03 LAB — CBC
HEMATOCRIT: 34.5 % — AB (ref 39.0–52.0)
Hemoglobin: 11.2 g/dL — ABNORMAL LOW (ref 13.0–17.0)
MCH: 30.4 pg (ref 26.0–34.0)
MCHC: 32.5 g/dL (ref 30.0–36.0)
MCV: 93.5 fL (ref 78.0–100.0)
Platelets: 211 10*3/uL (ref 150–400)
RBC: 3.69 MIL/uL — ABNORMAL LOW (ref 4.22–5.81)
RDW: 13.4 % (ref 11.5–15.5)
WBC: 12.8 10*3/uL — AB (ref 4.0–10.5)

## 2016-10-03 LAB — BASIC METABOLIC PANEL
ANION GAP: 8 (ref 5–15)
BUN: 27 mg/dL — ABNORMAL HIGH (ref 6–20)
CALCIUM: 9.4 mg/dL (ref 8.9–10.3)
CO2: 28 mmol/L (ref 22–32)
Chloride: 101 mmol/L (ref 101–111)
Creatinine, Ser: 1.67 mg/dL — ABNORMAL HIGH (ref 0.61–1.24)
GFR calc Af Amer: 41 mL/min — ABNORMAL LOW (ref >60)
GFR calc non Af Amer: 35 mL/min — ABNORMAL LOW (ref >60)
GLUCOSE: 125 mg/dL — AB (ref 65–99)
POTASSIUM: 4.2 mmol/L (ref 3.5–5.1)
Sodium: 137 mmol/L (ref 135–145)

## 2016-10-03 MED ORDER — MIRABEGRON ER 25 MG PO TB24: 25.0000 mg | ORAL_TABLET | Freq: Every day | ORAL | 0 refills | Status: DC

## 2016-10-03 MED ORDER — TRAMADOL HCL 50 MG PO TABS: 50.0000 mg | ORAL_TABLET | Freq: Four times a day (QID) | ORAL | 0 refills | Status: AC | PRN

## 2016-10-03 NOTE — Discharge Summary (Signed)
Physician Discharge Summary  Patient ID: [name] MRN: [id number] DOB/AGE: Feb 04, 1927 83 y.o.  Admit date: 10/02/2016 Discharge date: 10/03/2016  Admission Diagnoses:Left inguinal hernia   Discharge Diagnoses: S/P repair left inguinal hernia with mesh Active Problems:   S/P left inguinal hernia repair   Discharged Condition: good  Hospital Course: Did well post-op. Good pain control. He requests a refill on his Myrbetriq which he took last month for overactive bladder. I will prescribe 1 refill but advised him to see his primary care physician regarding this.  Consults: None  Significant Diagnostic Studies: none  Treatments: surgery  Discharge Exam: Blood pressure 121/60, pulse 71, temperature 97.7 F (36.5 C), temperature source Oral, resp. rate 18, height 5' 10.5" (1.791 m), weight 73.6 kg (162 lb 3 oz), SpO2 96 %. General appearance: alert and cooperative Resp: clear to auscultation bilaterally Cardio: regular rate and rhythm GI: soft, NT  L inguinal incision CDI  Disposition: 01-Home or Self Care  Discharge Instructions    Call MD for:  redness, tenderness, or signs of infection (pain, swelling, redness, odor or green/yellow discharge around incision site)    Complete by:  As directed    Call MD for:  severe uncontrolled pain    Complete by:  As directed    Diet - low sodium heart healthy    Complete by:  As directed    Discharge instructions    Complete by:  As directed    [facility] _______[facility]  UMBILICAL OR INGUINAL HERNIA REPAIR: POST OP INSTRUCTIONS  Always review your discharge instruction sheet given to you by the facility where your surgery was performed. IF YOU HAVE DISABILITY OR FAMILY LEAVE FORMS, YOU MUST BRING THEM TO THE OFFICE FOR PROCESSING.   DO NOT GIVE THEM TO YOUR DOCTOR.  1. A  prescription for pain medication may be given to you upon discharge.  Take your pain medication as prescribed, if needed.  If narcotic pain  medicine is not needed, then you may take acetaminophen (Tylenol) or ibuprofen (Advil) as needed. 2. Take your usually prescribed medications unless otherwise directed. If you need a refill on your pain medication, please contact your pharmacy.  They will contact our office to request authorization. Prescriptions will not be filled after 5 pm or on week-ends. 3. You should follow a light diet the first 24 hours after arrival home, such as soup and crackers, etc.  Be sure to include lots of fluids daily.  Resume your normal diet the day after surgery. 4.Most patients will experience some swelling and bruising around the umbilicus or in the groin and scrotum.  Ice packs and reclining will help.  Swelling and bruising can take several days to resolve.  6. It is common to experience some constipation if taking pain medication after surgery.  Increasing fluid intake and taking a stool softener (such as Colace) will usually help or prevent this problem from occurring.  A mild laxative (Milk of Magnesia or Miralax) should be taken according to package directions if there are no bowel movements after 48 hours. 7. Unless discharge instructions indicate otherwise, you may remove your bandages 24-48 hours after surgery, and you may shower at that time.  You may have steri-strips (small skin tapes) in place directly over the incision.  These strips should be left on the skin for 7-10 days.  If your surgeon used skin glue on the incision, you may shower in 24 hours.  The glue will flake off over the  next 2-3 weeks.  Any sutures or staples will be removed at the office during your follow-up visit. 8. ACTIVITIES:  You may resume regular (light) daily activities beginning the next day-such as daily self-care, walking, climbing stairs-gradually increasing activities as tolerated.  You may have sexual intercourse when it is comfortable.  Refrain from any heavy lifting or straining until approved by your doctor.  a.You may  drive when you are no longer taking prescription pain medication, you can comfortably wear a seatbelt, and you can safely maneuver your car and apply brakes. b.RETURN TO WORK:   _____________________________________________  9.You should see your doctor in the office for a follow-up appointment approximately 2-3 weeks after your surgery.  Make sure that you call for this appointment within a day or two after you arrive home to insure a convenient appointment time. 10.OTHER INSTRUCTIONS: _________________________    _____________________________________  WHEN TO CALL YOUR DOCTOR: Fever over 101.0 Inability to urinate Nausea and/or vomiting Extreme swelling or bruising Continued bleeding from incision. Increased pain, redness, or drainage from the incision  The clinic staff is available to answer your questions during regular business hours.  Please don't hesitate to call and ask to speak to one of the nurses for clinical concerns.  If you have a medical emergency, go to the nearest emergency room or call 911.  A surgeon from [facility] is always on call at the hospital   [address], [place], [place], [place]  [address] ?  [address], [place], [place]   [address] [phone] ? [phone] ? FAX [phone] Web site: [email or web address]   Increase activity slowly    Complete by:  As directed      Allergies as of 10/03/2016      Reactions   Lipitor [atorvastatin] Other (See Comments)   "feel bad"   Penicillins Itching   Has patient had a PCN reaction causing immediate rash, facial/tongue/throat swelling, SOB or lightheadedness with hypotension: Yes Has patient had a PCN reaction causing severe rash involving mucus membranes or skin necrosis: No Has patient had a PCN reaction that required hospitalization No Has patient had a PCN reaction occurring within the last 10 years: No If all of the above answers are "NO", then may proceed with  Cephalosporin use.      Medication List    TAKE these medications   acetaminophen 325 MG tablet Commonly known as:  TYLENOL Take 325-650 mg by mouth every 6 (six) hours as needed (for pain.).   apixaban 2.5 MG Tabs tablet Commonly known as:  ELIQUIS Take 1 tablet (2.5 mg total) by mouth 2 (two) times daily.   diltiazem 240 MG 24 hr capsule Commonly known as:  CARDIZEM CD Take 240 mg by mouth daily.   furosemide 40 MG tablet Commonly known as:  LASIX Take 40 mg by mouth daily.   levothyroxine 25 MCG tablet Commonly known as:  SYNTHROID, LEVOTHROID TAKE 1 TABLET EVERY DAY BEFORE BREAKFAST   mirabegron ER 25 MG Tb24 tablet Commonly known as:  MYRBETRIQ Take 1 tablet (25 mg total) by mouth daily.   nitroGLYCERIN 0.4 MG SL tablet Commonly known as:  NITROSTAT Place 1 tablet (0.4 mg total) under the tongue every 5 (five) minutes as needed for chest pain.   omeprazole 40 MG capsule Commonly known as:  PRILOSEC TAKE 1 CAPSULE EVERY DAY   oxybutynin 5 MG tablet Commonly known as:  DITROPAN TAKE 1 TABLET EVERY DAY   oxymetazoline 0.05 % nasal  spray Commonly known as:  AFRIN Place 1 spray into both nostrils 2 (two) times daily as needed for congestion.   pramipexole 1 MG tablet Commonly known as:  MIRAPEX Take 1 mg by mouth at bedtime.   pravastatin 40 MG tablet Commonly known as:  PRAVACHOL TAKE 1 TABLET EVERY DAY What changed:  See the new instructions.   ROLAIDS PO Take 1-2 tablets by mouth 3 (three) times daily as needed (for heartburn/indigestion).   traMADol 50 MG tablet Commonly known as:  ULTRAM Take 1 tablet (50 mg total) by mouth every 6 (six) hours as needed for moderate pain.   traZODone 100 MG tablet Commonly known as:  DESYREL Take 0.5 tablets (50 mg total) by mouth at bedtime as needed for sleep. What changed:  when to take this   Vitamin D3 1000 units Caps Take 1,000 Units by mouth daily.        SignedZenovia Jarred 10/03/2016, 8:38  AM

## 2016-10-22 ENCOUNTER — Other Ambulatory Visit: Payer: Self-pay | Admitting: Family Medicine

## 2016-10-24 ENCOUNTER — Ambulatory Visit: Payer: Medicare PPO | Admitting: Family Medicine

## 2016-11-21 ENCOUNTER — Other Ambulatory Visit: Payer: Medicare PPO

## 2016-11-21 DIAGNOSIS — E7849 Other hyperlipidemia: Secondary | ICD-10-CM

## 2016-11-21 DIAGNOSIS — E559 Vitamin D deficiency, unspecified: Secondary | ICD-10-CM

## 2016-11-21 DIAGNOSIS — C61 Malignant neoplasm of prostate: Secondary | ICD-10-CM

## 2016-11-21 DIAGNOSIS — R7989 Other specified abnormal findings of blood chemistry: Secondary | ICD-10-CM

## 2016-11-21 DIAGNOSIS — I10 Essential (primary) hypertension: Secondary | ICD-10-CM

## 2016-11-22 ENCOUNTER — Ambulatory Visit (INDEPENDENT_AMBULATORY_CARE_PROVIDER_SITE_OTHER): Payer: Medicare PPO | Admitting: Family Medicine

## 2016-11-22 ENCOUNTER — Encounter: Payer: Self-pay | Admitting: Family Medicine

## 2016-11-22 VITALS — BP 132/76 | HR 78 | Temp 96.4°F | Ht 70.5 in | Wt 156.0 lb

## 2016-11-22 DIAGNOSIS — R5383 Other fatigue: Secondary | ICD-10-CM

## 2016-11-22 DIAGNOSIS — W57XXXA Bitten or stung by nonvenomous insect and other nonvenomous arthropods, initial encounter: Secondary | ICD-10-CM

## 2016-11-22 DIAGNOSIS — E559 Vitamin D deficiency, unspecified: Secondary | ICD-10-CM | POA: Diagnosis not present

## 2016-11-22 DIAGNOSIS — I10 Essential (primary) hypertension: Secondary | ICD-10-CM | POA: Diagnosis not present

## 2016-11-22 DIAGNOSIS — R350 Frequency of micturition: Secondary | ICD-10-CM

## 2016-11-22 DIAGNOSIS — C61 Malignant neoplasm of prostate: Secondary | ICD-10-CM | POA: Diagnosis not present

## 2016-11-22 DIAGNOSIS — E784 Other hyperlipidemia: Secondary | ICD-10-CM | POA: Diagnosis not present

## 2016-11-22 DIAGNOSIS — E7849 Other hyperlipidemia: Secondary | ICD-10-CM

## 2016-11-22 LAB — BMP8+EGFR
BUN / CREAT RATIO: 17 (ref 10–24)
BUN: 23 mg/dL (ref 8–27)
CALCIUM: 10.7 mg/dL — AB (ref 8.6–10.2)
CHLORIDE: 102 mmol/L (ref 96–106)
CO2: 26 mmol/L (ref 18–29)
Creatinine, Ser: 1.37 mg/dL — ABNORMAL HIGH (ref 0.76–1.27)
GFR calc non Af Amer: 46 mL/min/{1.73_m2} — ABNORMAL LOW (ref >59)
GFR, EST AFRICAN AMERICAN: 53 mL/min/{1.73_m2} — AB (ref >59)
Glucose: 89 mg/dL (ref 65–99)
POTASSIUM: 4.6 mmol/L (ref 3.5–5.2)
SODIUM: 143 mmol/L (ref 134–144)

## 2016-11-22 LAB — CBC WITH DIFFERENTIAL/PLATELET
BASOS: 0 %
Basophils Absolute: 0 10*3/uL (ref 0.0–0.2)
EOS (ABSOLUTE): 0.3 10*3/uL (ref 0.0–0.4)
Eos: 4 %
Hematocrit: 40.7 % (ref 37.5–51.0)
Hemoglobin: 13.4 g/dL (ref 13.0–17.7)
IMMATURE GRANULOCYTES: 0 %
Immature Grans (Abs): 0 10*3/uL (ref 0.0–0.1)
LYMPHS ABS: 1 10*3/uL (ref 0.7–3.1)
Lymphs: 14 %
MCH: 31.2 pg (ref 26.6–33.0)
MCHC: 32.9 g/dL (ref 31.5–35.7)
MCV: 95 fL (ref 79–97)
MONOS ABS: 0.7 10*3/uL (ref 0.1–0.9)
Monocytes: 9 %
Neutrophils Absolute: 5.3 10*3/uL (ref 1.4–7.0)
Neutrophils: 73 %
PLATELETS: 256 10*3/uL (ref 150–379)
RBC: 4.29 x10E6/uL (ref 4.14–5.80)
RDW: 13.7 % (ref 12.3–15.4)
WBC: 7.3 10*3/uL (ref 3.4–10.8)

## 2016-11-22 LAB — URINALYSIS, COMPLETE
Bilirubin, UA: NEGATIVE
Glucose, UA: NEGATIVE
KETONES UA: NEGATIVE
LEUKOCYTES UA: NEGATIVE
NITRITE UA: NEGATIVE
PH UA: 7 (ref 5.0–7.5)
Protein, UA: NEGATIVE
RBC, UA: NEGATIVE
SPEC GRAV UA: 1.01 (ref 1.005–1.030)
Urobilinogen, Ur: 0.2 mg/dL (ref 0.2–1.0)

## 2016-11-22 LAB — HEPATIC FUNCTION PANEL
ALK PHOS: 106 IU/L (ref 39–117)
ALT: 14 IU/L (ref 0–44)
AST: 20 IU/L (ref 0–40)
Albumin: 4.4 g/dL (ref 3.5–4.7)
BILIRUBIN, DIRECT: 0.15 mg/dL (ref 0.00–0.40)
Bilirubin Total: 0.4 mg/dL (ref 0.0–1.2)
TOTAL PROTEIN: 6.8 g/dL (ref 6.0–8.5)

## 2016-11-22 LAB — MICROSCOPIC EXAMINATION
BACTERIA UA: NONE SEEN
EPITHELIAL CELLS (NON RENAL): NONE SEEN /HPF (ref 0–10)
RBC MICROSCOPIC, UA: NONE SEEN /HPF (ref 0–?)
RENAL EPITHEL UA: NONE SEEN /HPF
WBC, UA: NONE SEEN /hpf (ref 0–?)

## 2016-11-22 LAB — LIPID PANEL
CHOL/HDL RATIO: 2.7 ratio (ref 0.0–5.0)
Cholesterol, Total: 142 mg/dL (ref 100–199)
HDL: 52 mg/dL (ref >39)
LDL CALC: 71 mg/dL (ref 0–99)
Triglycerides: 96 mg/dL (ref 0–149)
VLDL CHOLESTEROL CAL: 19 mg/dL (ref 5–40)

## 2016-11-22 LAB — THYROID PANEL WITH TSH
Free Thyroxine Index: 1.9 (ref 1.2–4.9)
T3 Uptake Ratio: 28 % (ref 24–39)
T4 TOTAL: 6.9 ug/dL (ref 4.5–12.0)
TSH: 2.42 u[IU]/mL (ref 0.450–4.500)

## 2016-11-22 LAB — VITAMIN D 25 HYDROXY (VIT D DEFICIENCY, FRACTURES): VIT D 25 HYDROXY: 34.2 ng/mL (ref 30.0–100.0)

## 2016-11-22 MED ORDER — DOXYCYCLINE HYCLATE 100 MG PO TABS: 100.0000 mg | ORAL_TABLET | Freq: Two times a day (BID) | ORAL | 0 refills | Status: DC

## 2016-11-22 NOTE — Progress Notes (Signed)
Subjective:    Patient ID: [name], male    DOB: Jan 12, 1927, 83 y.o.   MRN: [id number]  HPI Pt here for follow up and management of chronic medical problems which includes hyperlipidemia and hypertension. He is taking medication regularly.This patient is doing well overall. He does complain of some fatigue and some swelling and numbness in his legs. He also has some urinary frequency and did try her best friend but this did not help and was too expensive. He is had lab work done and we will review this with him during the visit today. All of his cholesterol numbers were good and his LDL C cholesterol was 71 and ideally this should be less than 70 because of him having had bypass surgery in the past. We will continue with his current cluster all treatment and aggressive therapeutic lifestyle changes. The white blood cell count is not elevated. The CBC also has a normal hemoglobin that is improved from previously. Platelet count is adequate. The blood sugar was good at 89. The creatinine, the most important kidney function test remains slightly elevated but lower than it was one month ago. The electrolytes including potassium are good. All liver function tests were normal and the vitamin D was at the low end of the normal range at 34.2. All thyroid tests were normal. The patient denies any chest pain or shortness of breath more than usual. He is been less active this winter due to the weather he does have occasional shortness of breath. He denies any trouble with his stomach including nausea vomiting diarrhea blood in the stool or black tarry bowel movements. He is passing his water without problems is going frequently. He did try the myrbetrig  and this did not help with the frequency. He does say that his legs and feet feel swollen and numb but there are really not swollen.     Patient Active Problem List   Diagnosis Date Noted  . S/P left inguinal hernia repair 10/02/2016  . Neuropathy ([place])  05/25/2016  . Aortic atherosclerosis ([place]) 05/25/2016  . Primary osteoarthritis of left knee 09/20/2015  . Anemia, chronic renal failure 04/15/2015  . Edema 04/12/2015  . Hyperbilirubinemia   . Choledocholithiasis 04/06/2015  . Hypoxia   . Abdominal pain, epigastric   . Abnormal liver enzymes   . Abnormal LFTs   . Biliary sepsis   . Acute respiratory failure ([place])   . Severe sepsis ([place])   . Bacteremia due to Gram-negative bacteria   . Hiatal hernia   . Sepsis ([place]) 03/28/2015  . Incisional hernia, without obstruction or gangrene 08/17/2014  . Atrial flutter ([place]) 07/28/2014  . Palpitation 06/16/2014  . Right knee DJD 03/11/2014  . S/P CABG x 4 09/03/2013  . Insomnia 07/27/2013  . Testosterone deficiency 07/27/2013  . Hyperlipidemia 06/10/2013  . Heart murmur 06/10/2013  . Dyspnea 06/10/2013  . Inguinal hernia, left. 04/04/2012  . Paraesophageal hiatal hernia 03/24/2012  . Prostate cancer ([place]) 01/15/2012  . Special screening for malignant neoplasms, colon 01/15/2012   Outpatient Encounter Prescriptions as of 11/22/2016  Medication Sig  . acetaminophen (TYLENOL) 325 MG tablet Take 325-650 mg by mouth every 6 (six) hours as needed (for pain.).  Marland Kitchen apixaban (ELIQUIS) 2.5 MG TABS tablet Take 1 tablet (2.5 mg total) by mouth 2 (two) times daily.  . Ca Carbonate-Mag Hydroxide (ROLAIDS PO) Take 1-2 tablets by mouth 3 (three) times daily as needed (for heartburn/indigestion).  . Cholecalciferol (VITAMIN D3) 1000 UNITS CAPS  Take 1,000 Units by mouth daily.   Marland Kitchen diltiazem (CARDIZEM CD) 240 MG 24 hr capsule Take 240 mg by mouth daily.  . furosemide (LASIX) 40 MG tablet Take 40 mg by mouth daily.  Marland Kitchen levothyroxine (SYNTHROID, LEVOTHROID) 25 MCG tablet TAKE 1 TABLET EVERY DAY BEFORE BREAKFAST  . mirabegron ER (MYRBETRIQ) 25 MG TB24 tablet Take 1 tablet (25 mg total) by mouth daily.  . nitroGLYCERIN (NITROSTAT) 0.4 MG SL tablet Place 1 tablet (0.4 mg total) under the tongue every 5 (five)  minutes as needed for chest pain.  Marland Kitchen omeprazole (PRILOSEC) 40 MG capsule TAKE 1 CAPSULE EVERY DAY  . oxybutynin (DITROPAN) 5 MG tablet TAKE 1 TABLET EVERY DAY  . oxymetazoline (AFRIN) 0.05 % nasal spray Place 1 spray into both nostrils 2 (two) times daily as needed for congestion.  . pramipexole (MIRAPEX) 1 MG tablet Take 1 mg by mouth at bedtime.  . pravastatin (PRAVACHOL) 40 MG tablet TAKE 1 TABLET EVERY DAY (Patient taking differently: TAKE 1 TABLET EVERY DAY AT BEDTIME)  . traMADol (ULTRAM) 50 MG tablet Take 1 tablet (50 mg total) by mouth every 6 (six) hours as needed for moderate pain.  . traZODone (DESYREL) 100 MG tablet Take 0.5 tablets (50 mg total) by mouth at bedtime as needed for sleep. (Patient taking differently: Take 50 mg by mouth at bedtime. )   No facility-administered encounter medications on file as of 11/22/2016.      Review of Systems  Constitutional: Negative.   HENT: Negative.   Eyes: Negative.   Respiratory: Negative.   Cardiovascular: Negative.   Gastrointestinal: Negative.   Endocrine: Negative.   Genitourinary: Positive for frequency.  Musculoskeletal: Myalgias: legs - bilateral (feel numb and swollen)  Skin: Negative.   Allergic/Immunologic: Negative.   Neurological: Negative.   Hematological: Negative.   Psychiatric/Behavioral: Negative.        Objective:   Physical Exam  Constitutional: He is oriented to person, place, and time. He appears well-developed and well-nourished. No distress.  The patient is pleasant and alert and looks much younger than his stated age.  HENT:  Head: Normocephalic and atraumatic.  Right Ear: External ear normal.  Left Ear: External ear normal.  Nose: Nose normal.  Mouth/Throat: Oropharynx is clear and moist. No oropharyngeal exudate.  Eyes: Conjunctivae and EOM are normal. Pupils are equal, round, and reactive to light. Right eye exhibits no discharge. Left eye exhibits no discharge. No scleral icterus.  Neck: Normal  range of motion. Neck supple. No thyromegaly present.  No bruits thyromegaly or anterior cervical adenopathy  Cardiovascular: Normal rate, regular rhythm, normal heart sounds and intact distal pulses.   No murmur heard. Heart has a regular rate and rhythm at 84/m  Pulmonary/Chest: Effort normal and breath sounds normal. No respiratory distress. He has no wheezes. He has no rales. He exhibits no tenderness.  Clear anteriorly and posteriorly no axillary adenopathy  Abdominal: Soft. Bowel sounds are normal. He exhibits no mass. There is no tenderness. There is no rebound and no guarding.  No abdominal tenderness masses or bruits or organ enlargement  Genitourinary: Rectum normal and penis normal.  Genitourinary Comments: The prostate is absent and the vault is empty and there is no lumps or masses. The rectal exam was negative for masses. The external genitalia were normal without any hernias being palpated and there were no masses.  Musculoskeletal: Normal range of motion. He exhibits no edema.  Lymphadenopathy:    He has no cervical adenopathy.  Neurological:  He is alert and oriented to person, place, and time. He has normal reflexes. No cranial nerve deficit.  Skin: Skin is warm and dry. No rash noted. There is erythema.  The patient has had multiple tick bites which he said were tiny ticks which appeared to be like deer ticks. In doing his exam another tick was removed from his left low back in its entirety. There are small areas of erythema where tick bites were located  Psychiatric: He has a normal mood and affect. His behavior is normal. Judgment and thought content normal.  Nursing note and vitals reviewed.  BP 132/76 (BP Location: Left Arm)   Pulse 78   Temp (!) 96.4 F (35.8 C) (Oral)   Ht 5' 10.5" (1.791 m)   Wt 156 lb (70.8 kg)   BMI 22.07 kg/m         Assessment & Plan:  1. Essential hypertension -The blood pressure is good today and he will continue with current  treatment  2. Other hyperlipidemia -Continue with aggressive therapeutic lifestyle changes and current treatment with a goal to get the LDL C less than 70.  3. Urine frequency -This is most likely due to the absence of his prostate. We will treat with the antibiotic for the tick bite and we'll check the urinalysis. With the absence of the prostate gland he probably has just lost his ability to have good control. - Urinalysis, Complete  4. Prostate cancer [facility]) -This patient has had a prostatectomy. The rectal exam was negative and the prostate vault was empty. - Urinalysis, Complete  5. Vitamin D deficiency -Continue current treatment  6. Fatigue, unspecified type -Add B12 level to recent blood work  7. Urinary frequency -Referred to urology if urine is clear  8. Tick bite, initial encounter -Tests for [facility] spotted fever and Lyme disease. 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  Meds ordered this encounter  Medications  . doxycycline (VIBRA-TABS) 100 MG tablet    Sig: Take 1 tablet (100 mg total) by mouth 2 (two) times daily. 1 po bid    Dispense:  42 tablet    Refill:  0   Patient Instructions                       Medicare Annual Wellness Visit  [facility] and the medical providers at [facility] strive to bring you the best medical care.  In doing so we not only want to address your current medical conditions and concerns but also to detect new conditions early and prevent illness, disease and health-related problems.    Medicare offers a yearly Wellness Visit which allows our clinical staff to assess your need for preventative services including immunizations, lifestyle education, counseling to decrease risk of preventable diseases and screening for fall risk and other medical concerns.    This visit is provided free of charge (no copay) for all Medicare  recipients. The clinical pharmacists at [facility] have begun to conduct these Wellness Visits which will also include a thorough review of all your medications.    As you primary medical provider recommend that you make an appointment for your Annual Wellness Visit if you have not done so already this year.  You may set up this appointment before you leave today or you may call back ([phone]) and schedule an appointment.  Please  make sure when you call that you mention that you are scheduling your Annual Wellness Visit with the clinical pharmacist so that the appointment may be made for the proper length of time.     Continue current medications. Continue good therapeutic lifestyle changes which include good diet and exercise. Fall precautions discussed with patient. If an FOBT was given today- please return it to our front desk. If you are over 33 years old - you may need Prevnar 3 or the adult Pneumonia vaccine.  **Flu shots are available--- please call and schedule a FLU-CLINIC appointment**  After your visit with [place] today you will receive a survey in the mail or online from [employer] regarding your care with [place]. Please take a moment to fill this out. Your feedback is very important to [place] as you can help [place] better understand your patient needs as well as improve your experience and satisfaction. WE CARE ABOUT YOU!!!   Continue to drink plenty of fluids and stay active as possible Watch diet closely and stay with current cholesterol medicine Follow-up with cardiology as planned When the urinalysis is returned and a urine culture we will make a decision about what to do about the urinary frequency. We still may get you to see a urologist for further evaluation for this but we will wait till the lab results are back on this. Check the body more closely for tick bites and tics. Use deep [place] off spray and wear 2 pairs of socks with the pants to inside the second set  of socks. The deep was off can be purchased at [employer]. We will do the tests for Lyme disease and [facility] spotted fever and we'll call you with those results Please start doxycycline take 1 twice daily with food for 3 weeks   Arrie Senate MD

## 2016-11-22 NOTE — Patient Instructions (Addendum)
Medicare Annual Wellness Visit  [facility] and the medical providers at [facility] strive to bring you the best medical care.  In doing so we not only want to address your current medical conditions and concerns but also to detect new conditions early and prevent illness, disease and health-related problems.    Medicare offers a yearly Wellness Visit which allows our clinical staff to assess your need for preventative services including immunizations, lifestyle education, counseling to decrease risk of preventable diseases and screening for fall risk and other medical concerns.    This visit is provided free of charge (no copay) for all Medicare recipients. The clinical pharmacists at [facility] have begun to conduct these Wellness Visits which will also include a thorough review of all your medications.    As you primary medical provider recommend that you make an appointment for your Annual Wellness Visit if you have not done so already this year.  You may set up this appointment before you leave today or you may call back ([phone]) and schedule an appointment.  Please make sure when you call that you mention that you are scheduling your Annual Wellness Visit with the clinical pharmacist so that the appointment may be made for the proper length of time.     Continue current medications. Continue good therapeutic lifestyle changes which include good diet and exercise. Fall precautions discussed with patient. If an FOBT was given today- please return it to our front desk. If you are over 16 years old - you may need Prevnar 60 or the adult Pneumonia vaccine.  **Flu shots are available--- please call and schedule a FLU-CLINIC appointment**  After your visit with [place] today you will receive a survey in the mail or online from [employer] regarding your care with [place]. Please take a moment to fill this out. Your feedback is very  important to [place] as you can help [place] better understand your patient needs as well as improve your experience and satisfaction. WE CARE ABOUT YOU!!!   Continue to drink plenty of fluids and stay active as possible Watch diet closely and stay with current cholesterol medicine Follow-up with cardiology as planned When the urinalysis is returned and a urine culture we will make a decision about what to do about the urinary frequency. We still may get you to see a urologist for further evaluation for this but we will wait till the lab results are back on this. Check the body more closely for tick bites and tics. Use deep [place] off spray and wear 2 pairs of socks with the pants to inside the second set of socks. The deep was off can be purchased at [employer]. We will do the tests for Lyme disease and [facility] spotted fever and we'll call you with those results Please start doxycycline take 1 twice daily with food for 3 weeks

## 2016-11-27 LAB — VITAMIN B12: VITAMIN B 12: 632 pg/mL (ref 232–1245)

## 2016-11-27 LAB — LYME AB/WESTERN BLOT REFLEX: LYME DISEASE AB, QUANT, IGM: <0.8 index (ref 0.00–0.79)

## 2016-11-27 LAB — RMSF, IGG, IFA

## 2016-11-27 LAB — ROCKY MTN SPOTTED FVR AB, IGG-BLOOD: RMSF IGG: POSITIVE — AB

## 2016-11-27 LAB — SPECIMEN STATUS REPORT

## 2016-12-04 ENCOUNTER — Other Ambulatory Visit: Payer: Self-pay | Admitting: Family Medicine

## 2016-12-04 ENCOUNTER — Ambulatory Visit (INDEPENDENT_AMBULATORY_CARE_PROVIDER_SITE_OTHER): Payer: Medicare PPO | Admitting: Family Medicine

## 2016-12-04 ENCOUNTER — Encounter: Payer: Self-pay | Admitting: Family Medicine

## 2016-12-04 VITALS — BP 133/83 | HR 94 | Temp 97.2°F | Ht 70.5 in | Wt 158.0 lb

## 2016-12-04 DIAGNOSIS — R21 Rash and other nonspecific skin eruption: Secondary | ICD-10-CM | POA: Diagnosis not present

## 2016-12-04 DIAGNOSIS — T887XXA Unspecified adverse effect of drug or medicament, initial encounter: Secondary | ICD-10-CM

## 2016-12-04 DIAGNOSIS — T50905A Adverse effect of unspecified drugs, medicaments and biological substances, initial encounter: Secondary | ICD-10-CM

## 2016-12-04 NOTE — Progress Notes (Signed)
Subjective:  Patient ID: [name], male    DOB: Nov 18, 1926  Age: 83 y.o. MRN: [id number]  CC: Medication Problem (pt here toda to discuss a reaction that he believes was caused by Doxy)     HPI [name] presents for burning sensation on the face, back of hands soon after starting use of antibiotic. There has also been some redness starting on the face and the next day on the knuckles and then covering the dorsum of both hands. He describes this as a stinging. He discontinued the antibiotic about 5 days ago and the symptoms have started to diminish. He thinks given a couple more days until clear completely. He is also concerned about future tick bites. He denies any rash fever chills sweats from previous tick bites.   History [name] has a past medical history of Arthritis; Atrial fibrillation and flutter ([name]); Choledocholithiasis; CKD (chronic kidney disease) (08/2013); Coronary artery disease; Dysrhythmia; GERD (gastroesophageal reflux disease); H/O hiatal hernia; Hyperlipemia; Hypertension; Hypothyroidism; Pneumonia (~ 1935); and Prostate cancer ([name]).   He has a past surgical history that includes Prostate surgery; Colonoscopy (2003, 01/2012); Coronary artery bypass graft (N/A, 09/03/2013); Intraoprative transesophageal echocardiogram (N/A, 09/03/2013); Coronary angioplasty; Cataract extraction (Bilateral); Total knee arthroplasty (Right, 03/11/2014); left heart catheterization with coronary angiogram (N/A, 08/03/2013); Esophagogastroduodenoscopy (01/2012); ERCP (N/A, 04/04/2015); ERCP (N/A, 04/06/2015); Eye surgery; Total knee arthroplasty (Left, 09/20/2015); Thoracotomy (Right, As a young child, ? 13 years old); Hernia repair; Inguinal hernia repair (Left, 10/02/2016); Inguinal hernia repair (Left, 10/02/2016); and Insertion of mesh (Left, 10/02/2016).   His family history includes Cancer in his mother.He reports that he quit smoking about 38 years ago. His smoking use included Cigarettes. He smoked  1.00 pack per day. He has never used smokeless tobacco. He reports that he does not drink alcohol or use drugs.    ROS Review of Systems  Constitutional: Negative for chills, diaphoresis and fever.  HENT: Negative for rhinorrhea and sore throat.   Respiratory: Negative for cough and shortness of breath.   Cardiovascular: Negative for chest pain.  Gastrointestinal: Negative for abdominal pain.  Musculoskeletal: Negative for arthralgias and myalgias.  Skin: Negative for rash.  Neurological: Negative for weakness and headaches.    Objective:  BP (!) 157/81   Pulse 94   Temp 97.2 F (36.2 C) (Oral)   Ht 5' 10.5" (1.791 m)   Wt 158 lb (71.7 kg)   BMI 22.35 kg/m   BP Readings from Last 3 Encounters:  12/04/16 (!) 157/81  11/22/16 132/76  10/03/16 121/60    Wt Readings from Last 3 Encounters:  12/04/16 158 lb (71.7 kg)  11/22/16 156 lb (70.8 kg)  10/02/16 162 lb 3 oz (73.6 kg)     Physical Exam  Constitutional: He is oriented to person, place, and time. He appears well-developed and well-nourished.  HENT:  Head: Normocephalic and atraumatic.  Right Ear: External ear normal.  Left Ear: External ear normal.  Mouth/Throat: No oropharyngeal exudate or posterior oropharyngeal erythema.  Eyes: Pupils are equal, round, and reactive to light.  Neck: Normal range of motion. Neck supple.  Cardiovascular: Normal rate and regular rhythm.   No murmur heard. Pulmonary/Chest: Breath sounds normal. No respiratory distress.  Neurological: He is alert and oriented to person, place, and time.  Skin: Skin is warm and dry. No rash noted. There is erythema (moderate facial erythema. This appears to be more rosacea related but may be exacerbated by the current condition as well. The hands are  free of lesion regarding erythema etc.).  Vitals reviewed.     Assessment & Plan:   [name] was seen today for medication problem.  Diagnoses and all orders for this visit:  Adverse effect of  drug, initial encounter       I am having [name] maintain his Vitamin D3, apixaban, diltiazem, furosemide, pramipexole, nitroGLYCERIN, pravastatin, omeprazole, oxybutynin, traZODone, oxymetazoline, Ca Carbonate-Mag Hydroxide (ROLAIDS PO), acetaminophen, traMADol, levothyroxine, and doxycycline.  Allergies as of 12/04/2016      Reactions   Lipitor [atorvastatin] Other (See Comments)   "feel bad"   Penicillins Itching   Has patient had a PCN reaction causing immediate rash, facial/tongue/throat swelling, SOB or lightheadedness with hypotension: Yes Has patient had a PCN reaction causing severe rash involving mucus membranes or skin necrosis: No Has patient had a PCN reaction that required hospitalization No Has patient had a PCN reaction occurring within the last 10 years: No If all of the above answers are "NO", then may proceed with Cephalosporin use.      Medication List       Accurate as of 12/04/16  9:02 AM. Always use your most recent med list.          acetaminophen 325 MG tablet Commonly known as:  TYLENOL Take 325-650 mg by mouth every 6 (six) hours as needed (for pain.).   apixaban 2.5 MG Tabs tablet Commonly known as:  ELIQUIS Take 1 tablet (2.5 mg total) by mouth 2 (two) times daily.   diltiazem 240 MG 24 hr capsule Commonly known as:  CARDIZEM CD Take 240 mg by mouth daily.   doxycycline 100 MG tablet Commonly known as:  VIBRA-TABS Take 1 tablet (100 mg total) by mouth 2 (two) times daily. 1 po bid   furosemide 40 MG tablet Commonly known as:  LASIX Take 40 mg by mouth daily.   levothyroxine 25 MCG tablet Commonly known as:  SYNTHROID, LEVOTHROID TAKE 1 TABLET EVERY DAY BEFORE BREAKFAST   nitroGLYCERIN 0.4 MG SL tablet Commonly known as:  NITROSTAT Place 1 tablet (0.4 mg total) under the tongue every 5 (five) minutes as needed for chest pain.   omeprazole 40 MG capsule Commonly known as:  PRILOSEC TAKE 1 CAPSULE EVERY DAY   oxybutynin 5 MG  tablet Commonly known as:  DITROPAN TAKE 1 TABLET EVERY DAY   oxymetazoline 0.05 % nasal spray Commonly known as:  AFRIN Place 1 spray into both nostrils 2 (two) times daily as needed for congestion.   pramipexole 1 MG tablet Commonly known as:  MIRAPEX Take 1 mg by mouth at bedtime.   pravastatin 40 MG tablet Commonly known as:  PRAVACHOL TAKE 1 TABLET EVERY DAY   ROLAIDS PO Take 1-2 tablets by mouth 3 (three) times daily as needed (for heartburn/indigestion).   traMADol 50 MG tablet Commonly known as:  ULTRAM Take 1 tablet (50 mg total) by mouth every 6 (six) hours as needed for moderate pain.   traZODone 100 MG tablet Commonly known as:  DESYREL Take 0.5 tablets (50 mg total) by mouth at bedtime as needed for sleep.   Vitamin D3 1000 units Caps Take 1,000 Units by mouth daily.      I believe the patient is describing a photosensitivity reaction between his skin and doxycycline when in the sun. Of note is that only sun exposed areas are her affected. Additionally patient does spend a lot of time out in the sun usually. He says it has been no more than usual.  He is skeptical about the fact that this time of year the sun is not very bright when pt.  is outside. Follow-up: Return if symptoms worsen or fail to improve. I told him if his symptoms did not continue to resolve I did send in a prescription for Elocon  Claretta Fraise, M.D.

## 2016-12-05 ENCOUNTER — Other Ambulatory Visit: Payer: Self-pay | Admitting: Family

## 2016-12-06 ENCOUNTER — Other Ambulatory Visit: Payer: Self-pay | Admitting: *Deleted

## 2016-12-06 MED ORDER — LEVOTHYROXINE SODIUM 25 MCG PO TABS: ORAL_TABLET | ORAL | 2 refills | Status: DC

## 2016-12-17 ENCOUNTER — Encounter (HOSPITAL_COMMUNITY): Payer: Self-pay | Admitting: Emergency Medicine

## 2016-12-17 ENCOUNTER — Emergency Department (HOSPITAL_COMMUNITY)
Admission: EM | Admit: 2016-12-17 | Discharge: 2016-12-18 | Disposition: A | Discharge status: Home / Self Care | Payer: Medicare PPO | Attending: Emergency Medicine | Admitting: Emergency Medicine

## 2016-12-17 ENCOUNTER — Emergency Department (HOSPITAL_COMMUNITY): Payer: Medicare PPO

## 2016-12-17 DIAGNOSIS — Z8546 Personal history of malignant neoplasm of prostate: Secondary | ICD-10-CM | POA: Diagnosis not present

## 2016-12-17 DIAGNOSIS — E039 Hypothyroidism, unspecified: Secondary | ICD-10-CM | POA: Diagnosis not present

## 2016-12-17 DIAGNOSIS — R112 Nausea with vomiting, unspecified: Secondary | ICD-10-CM | POA: Diagnosis present

## 2016-12-17 DIAGNOSIS — I129 Hypertensive chronic kidney disease with stage 1 through stage 4 chronic kidney disease, or unspecified chronic kidney disease: Secondary | ICD-10-CM | POA: Diagnosis not present

## 2016-12-17 DIAGNOSIS — I2510 Atherosclerotic heart disease of native coronary artery without angina pectoris: Secondary | ICD-10-CM | POA: Insufficient documentation

## 2016-12-17 DIAGNOSIS — N183 Chronic kidney disease, stage 3 (moderate): Secondary | ICD-10-CM | POA: Insufficient documentation

## 2016-12-17 DIAGNOSIS — K449 Diaphragmatic hernia without obstruction or gangrene: Secondary | ICD-10-CM | POA: Diagnosis not present

## 2016-12-17 DIAGNOSIS — Z79899 Other long term (current) drug therapy: Secondary | ICD-10-CM | POA: Insufficient documentation

## 2016-12-17 DIAGNOSIS — Z87891 Personal history of nicotine dependence: Secondary | ICD-10-CM | POA: Diagnosis not present

## 2016-12-17 DIAGNOSIS — R1084 Generalized abdominal pain: Secondary | ICD-10-CM

## 2016-12-17 LAB — COMPREHENSIVE METABOLIC PANEL
ALK PHOS: 103 U/L (ref 38–126)
ALT: 21 U/L (ref 17–63)
AST: 31 U/L (ref 15–41)
Albumin: 4.2 g/dL (ref 3.5–5.0)
Anion gap: 12 (ref 5–15)
BUN: 24 mg/dL — ABNORMAL HIGH (ref 6–20)
CALCIUM: 10 mg/dL (ref 8.9–10.3)
CO2: 26 mmol/L (ref 22–32)
CREATININE: 1.51 mg/dL — AB (ref 0.61–1.24)
Chloride: 105 mmol/L (ref 101–111)
GFR calc non Af Amer: 40 mL/min — ABNORMAL LOW (ref >60)
GFR, EST AFRICAN AMERICAN: 46 mL/min — AB (ref >60)
GLUCOSE: 161 mg/dL — AB (ref 65–99)
Potassium: 3.3 mmol/L — ABNORMAL LOW (ref 3.5–5.1)
SODIUM: 143 mmol/L (ref 135–145)
Total Bilirubin: 0.7 mg/dL (ref 0.3–1.2)
Total Protein: 7.2 g/dL (ref 6.5–8.1)

## 2016-12-17 LAB — CBC
HCT: 38.9 % — ABNORMAL LOW (ref 39.0–52.0)
Hemoglobin: 13 g/dL (ref 13.0–17.0)
MCH: 31.7 pg (ref 26.0–34.0)
MCHC: 33.4 g/dL (ref 30.0–36.0)
MCV: 94.9 fL (ref 78.0–100.0)
PLATELETS: 224 10*3/uL (ref 150–400)
RBC: 4.1 MIL/uL — ABNORMAL LOW (ref 4.22–5.81)
RDW: 13.2 % (ref 11.5–15.5)
WBC: 8.6 10*3/uL (ref 4.0–10.5)

## 2016-12-17 LAB — LIPASE, BLOOD: Lipase: 30 U/L (ref 11–51)

## 2016-12-17 MED ORDER — SODIUM CHLORIDE 0.9 % IV BOLUS (SEPSIS)
1000.0000 mL | Freq: Once | INTRAVENOUS | Status: AC
  Administered 2016-12-17: 1000 mL via INTRAVENOUS

## 2016-12-17 MED ORDER — ONDANSETRON HCL 4 MG/2ML IJ SOLN
4.0000 mg | Freq: Once | INTRAMUSCULAR | Status: AC | PRN
Start: 2016-12-17 — End: 2016-12-17
  Administered 2016-12-17: 4 mg via INTRAVENOUS
  Filled 2016-12-17: qty 2

## 2016-12-17 NOTE — ED Triage Notes (Signed)
Pt c/o abd pain with nausea after eating oyster stew for dinner.

## 2016-12-17 NOTE — ED Provider Notes (Signed)
[facility] DEPT Provider Note   CSN: [id number] Arrival date & time: 12/17/16  2211   By signing my name below, I, Dolores Hoose, attest that this documentation has been prepared under the direction and in the presence of Rolland Porter, MD . Electronically Signed: Dolores Hoose, Scribe. 12/17/2016. 11:02 PM.  Time Seen: 2317 PM  History   Chief Complaint Chief Complaint  Patient presents with  . Abdominal Pain   The history is provided by the patient. No language interpreter was used.    HPI Comments:  [name] is a 83 y.o. male who presents to the Emergency Department complaining of gradually resolving, mild, right-sided abdominal pain onset 3 hours ago. Pt states that he had some oyster stew earlier tonight using canned oysters that he boiled in milk and his symptoms began shortly, about 1 hour, after eating. He reports associated nausea, need to burp, and a sensation that the right side of his stomach is "hard". Per pt's son, he states that although the pt did not expel any vomit, he believes the pt may have aspirated some vomit. Pt has tried Rolaids for his symptoms with moderate relief. He notes that he has had similar symptoms in the past that were typically related to gas. Denies any new SOB, coughing, vomiting, diarrhea, constipation, abdominal bloating, fever or difficulty urinating. Pt has a previous abdominal shx of left inguinal hernia repair in January and cholecystectomy. He is a nonsmoker, a non-drinker, and lives alone.  Son states patient had had sepsis and was thought to be from gallstones. However they have not removed his gallbladder. Patient complains of chronic shortness of breath which is not worse tonight.  PCP: Dr. [name] Surgeon: Dr. Georganna Skeans   Past Medical History:  Diagnosis Date  . Arthritis   . Atrial fibrillation and flutter ([place])   . Choledocholithiasis   . CKD (chronic kidney disease) 08/2013   stage 3  . Coronary artery disease   .  Dysrhythmia    atrial fib  . GERD (gastroesophageal reflux disease)    Large HH with intrathoracic component.   . H/O hiatal hernia   . Hyperlipemia   . Hypertension   . Hypothyroidism   . Pneumonia ~ 1935  . Prostate cancer [facility])     Patient Active Problem List   Diagnosis Date Noted  . S/P left inguinal hernia repair 10/02/2016  . Neuropathy 05/25/2016  . Aortic atherosclerosis ([place]) 05/25/2016  . Primary osteoarthritis of left knee 09/20/2015  . Anemia, chronic renal failure 04/15/2015  . Edema 04/12/2015  . Hyperbilirubinemia   . Choledocholithiasis 04/06/2015  . Hypoxia   . Abdominal pain, epigastric   . Abnormal liver enzymes   . Abnormal LFTs   . Biliary sepsis   . Acute respiratory failure ([place])   . Severe sepsis ([place])   . Bacteremia due to Gram-negative bacteria   . Hiatal hernia   . Sepsis ([place]) 03/28/2015  . Incisional hernia, without obstruction or gangrene 08/17/2014  . Atrial flutter ([place]) 07/28/2014  . Palpitation 06/16/2014  . Right knee DJD 03/11/2014  . S/P CABG x 4 09/03/2013  . Insomnia 07/27/2013  . Testosterone deficiency 07/27/2013  . Hyperlipidemia 06/10/2013  . Heart murmur 06/10/2013  . Dyspnea 06/10/2013  . Inguinal hernia, left. 04/04/2012  . Paraesophageal hiatal hernia 03/24/2012  . Prostate cancer ([place]) 01/15/2012  . Special screening for malignant neoplasms, colon 01/15/2012    Past Surgical History:  Procedure Laterality Date  . CATARACT EXTRACTION Bilateral   .  COLONOSCOPY  2003, 01/2012   non-bleeding cecal AVMs, descending and sigmoid tics.   . CORONARY ANGIOPLASTY    . CORONARY ARTERY BYPASS GRAFT N/A 09/03/2013   Procedure: CORONARY ARTERY BYPASS GRAFTING times four using left internal mammary and bilateral saphenous vein.;  Surgeon: Ivin Poot, MD;  Location: [facility];  Service: Open Heart Surgery;  Laterality: N/A;  . ERCP N/A 04/04/2015   Procedure: ENDOSCOPIC RETROGRADE CHOLANGIOPANCREATOGRAPHY (ERCP);  Surgeon: Inda Castle, MD;  Location: [facility];  Service: Endoscopy;  Laterality: N/A;  . ERCP N/A 04/06/2015   Procedure: ENDOSCOPIC RETROGRADE CHOLANGIOPANCREATOGRAPHY (ERCP);  Surgeon: Inda Castle, MD;  Location: [facility];  Service: Endoscopy;  Laterality: N/A;  . ESOPHAGOGASTRODUODENOSCOPY  01/2012   11 cm sliding hiatal hernia.  [facility] EYE SURGERY     cataracts removed, ? IOL  . HERNIA REPAIR     abdominal?  . INGUINAL HERNIA REPAIR Left 10/02/2016  . INGUINAL HERNIA REPAIR Left 10/02/2016   Procedure: REPAIR LEFT INGUINAL HERNIA WITH MESH;  Surgeon: Georganna Skeans, MD;  Location: [place];  Service: General;  Laterality: Left;  . INSERTION OF MESH Left 10/02/2016   Procedure: INSERTION OF MESH;  Surgeon: Georganna Skeans, MD;  Location: [facility];  Service: General;  Laterality: Left;  . INTRAOPERATIVE TRANSESOPHAGEAL ECHOCARDIOGRAM N/A 09/03/2013   Procedure: INTRAOPERATIVE TRANSESOPHAGEAL ECHOCARDIOGRAM;  Surgeon: Ivin Poot, MD;  Location: [facility];  Service: Open Heart Surgery;  Laterality: N/A;  . LEFT HEART CATHETERIZATION WITH CORONARY ANGIOGRAM N/A 08/03/2013   Procedure: LEFT HEART CATHETERIZATION WITH CORONARY ANGIOGRAM;  Surgeon: Blane Ohara, MD;  Location: [facility] CATH LAB;  Service: Cardiovascular;  Laterality: N/A;  . PROSTATE SURGERY    . THORACOTOMY Right As a young child, ? 59 years old   for pneumonia   . TOTAL KNEE ARTHROPLASTY Right 03/11/2014   Procedure: TOTAL KNEE ARTHROPLASTY;  Surgeon: Hessie Dibble, MD;  Location: [place];  Service: Orthopedics;  Laterality: Right;  . TOTAL KNEE ARTHROPLASTY Left 09/20/2015   Procedure: TOTAL KNEE ARTHROPLASTY;  Surgeon: Melrose Nakayama, MD;  Location: [place];  Service: Orthopedics;  Laterality: Left;       Home Medications    Prior to Admission medications   Medication Sig Start Date End Date Taking? Authorizing Provider  acetaminophen (TYLENOL) 325 MG tablet Take 325-650 mg by mouth every 6 (six) hours as needed (for pain.).   Yes  Historical Provider, MD  Ca Carbonate-Mag Hydroxide (ROLAIDS PO) Take 1-2 tablets by mouth 3 (three) times daily as needed (for heartburn/indigestion).   Yes Historical Provider, MD  Cholecalciferol (VITAMIN D3) 1000 UNITS CAPS Take 1,000 Units by mouth every morning.    Yes Historical Provider, MD  diltiazem (CARDIZEM CD) 240 MG 24 hr capsule TAKE 1 CAPSULE EVERY DAY 12/05/16  Yes Chipper Herb, MD  ELIQUIS 2.5 MG TABS tablet TAKE 1 TABLET TWICE DAILY 12/05/16  Yes Chipper Herb, MD  furosemide (LASIX) 40 MG tablet TAKE 1 TABLET EVERY DAY 12/05/16  Yes Chipper Herb, MD  nitroGLYCERIN (NITROSTAT) 0.4 MG SL tablet Place 1 tablet (0.4 mg total) under the tongue every 5 (five) minutes as needed for chest pain. 06/06/16 09/20/17 Yes Minus Breeding, MD  oxymetazoline (AFRIN) 0.05 % nasal spray Place 1 spray into both nostrils 2 (two) times daily as needed for congestion.   Yes Historical Provider, MD  pramipexole (MIRAPEX) 1 MG tablet Take 1 mg by mouth at bedtime.   Yes Historical Provider, MD  pravastatin (  PRAVACHOL) 40 MG tablet TAKE 1 TABLET EVERY DAY Patient taking differently: TAKE 1 TABLET EVERY DAY AT BEDTIME 08/21/16  Yes Chipper Herb, MD  traZODone (DESYREL) 100 MG tablet Take 0.5 tablets (50 mg total) by mouth at bedtime as needed for sleep. Patient taking differently: Take 50 mg by mouth at bedtime.  08/30/16  Yes Chipper Herb, MD  clindamycin (CLEOCIN) 150 MG capsule 3 day course starting on 12/12/2016 12/12/16   Historical Provider, MD  doxycycline (VIBRA-TABS) 100 MG tablet Take 1 tablet (100 mg total) by mouth 2 (two) times daily. 1 po bid Patient not taking: Reported on 12/04/2016 11/22/16   Chipper Herb, MD  levothyroxine (SYNTHROID, LEVOTHROID) 25 MCG tablet TAKE 1 TABLET EVERY DAY BEFORE BREAKFAST 12/06/16   Chipper Herb, MD  omeprazole (PRILOSEC) 40 MG capsule TAKE 1 CAPSULE EVERY DAY 08/21/16   Chipper Herb, MD  ondansetron (ZOFRAN) 4 MG tablet Take 1 tablet (4 mg total) by mouth  every 8 (eight) hours as needed. 12/18/16   Rolland Porter, MD  oxybutynin (DITROPAN) 5 MG tablet TAKE 1 TABLET EVERY DAY 08/21/16   Chipper Herb, MD  traMADol (ULTRAM) 50 MG tablet Take 1 tablet (50 mg total) by mouth every 6 (six) hours as needed for moderate pain. Patient not taking: Reported on 12/17/2016 10/03/16   Georganna Skeans, MD    Family History Family History  Problem Relation Age of Onset  . Cancer Mother     bone marrow  . Colon cancer Neg Hx     Social History Social History  Substance Use Topics  . Smoking status: Former Smoker    Packs/day: 1.00    Types: Cigarettes    Quit date: 08/20/1978  . Smokeless tobacco: Never Used  . Alcohol use No  lives at home Lives alone   Allergies   Lipitor [atorvastatin]; Penicillins; and Doxycycline   Review of Systems Review of Systems  Constitutional: Negative for fever.  Respiratory: Negative for shortness of breath.   Gastrointestinal: Positive for abdominal distention ("Hard" Sensation) and nausea. Negative for constipation, diarrhea and vomiting.       Positive for Burping  Genitourinary: Negative for difficulty urinating.  All other systems reviewed and are negative.    Physical Exam Updated Vital Signs BP (!) 144/97 (BP Location: Right Arm)   Pulse (!) 103   Resp 18   Ht 5\' 11"  (1.803 m)   Wt 160 lb (72.6 kg)   SpO2 95%   BMI 22.32 kg/m   Vital signs normal except for tachycardia   Physical Exam  Constitutional: He is oriented to person, place, and time. He appears well-developed and well-nourished.  Non-toxic appearance. He does not appear ill. No distress.  HENT:  Head: Normocephalic and atraumatic.  Right Ear: External ear normal.  Left Ear: External ear normal.  Nose: Nose normal. No mucosal edema or rhinorrhea.  Mouth/Throat: Mucous membranes are normal. No dental abscesses or uvula swelling.  Dry mucous membranes.   Eyes: Conjunctivae and EOM are normal. Pupils are equal, round, and reactive to  light.  Neck: Normal range of motion and full passive range of motion without pain. Neck supple.  Cardiovascular: Normal rate, regular rhythm and normal heart sounds.  Exam reveals no gallop and no friction rub.   No murmur heard. Pulmonary/Chest: Effort normal and breath sounds normal. No respiratory distress. He has no wheezes. He has no rhonchi. He has no rales. He exhibits no tenderness and no crepitus.  Abdominal: Soft. Normal appearance and bowel sounds are normal. He exhibits no distension. There is no tenderness. There is no rebound and no guarding.  Nontender but right side of abdomen feels firmer than the left.   Musculoskeletal: Normal range of motion. He exhibits no edema or tenderness.  Neurological: He is alert and oriented to person, place, and time. He has normal strength. No cranial nerve deficit.  Skin: Skin is warm, dry and intact. No rash noted. No erythema. No pallor.  Psychiatric: He has a normal mood and affect. His speech is normal and behavior is normal. His mood appears not anxious.  Nursing note and vitals reviewed.    ED Treatments / Results  Labs (all labs ordered are listed, but only abnormal results are displayed) Results for orders placed or performed during the hospital encounter of 12/17/16  Lipase, blood  Result Value Ref Range   Lipase 30 11 - 51 U/L  Comprehensive metabolic panel  Result Value Ref Range   Sodium 143 135 - 145 mmol/L   Potassium 3.3 (L) 3.5 - 5.1 mmol/L   Chloride 105 101 - 111 mmol/L   CO2 26 22 - 32 mmol/L   Glucose, Bld 161 (H) 65 - 99 mg/dL   BUN 24 (H) 6 - 20 mg/dL   Creatinine, Ser 1.51 (H) 0.61 - 1.24 mg/dL   Calcium 10.0 8.9 - 10.3 mg/dL   Total Protein 7.2 6.5 - 8.1 g/dL   Albumin 4.2 3.5 - 5.0 g/dL   AST 31 15 - 41 U/L   ALT 21 17 - 63 U/L   Alkaline Phosphatase 103 38 - 126 U/L   Total Bilirubin 0.7 0.3 - 1.2 mg/dL   GFR calc non Af Amer 40 (L) >60 mL/min   GFR calc Af Amer 46 (L) >60 mL/min   Anion gap 12 5 - 15    CBC  Result Value Ref Range   WBC 8.6 4.0 - 10.5 K/uL   RBC 4.10 (L) 4.22 - 5.81 MIL/uL   Hemoglobin 13.0 13.0 - 17.0 g/dL   HCT 38.9 (L) 39.0 - 52.0 %   MCV 94.9 78.0 - 100.0 fL   MCH 31.7 26.0 - 34.0 pg   MCHC 33.4 30.0 - 36.0 g/dL   RDW 13.2 11.5 - 15.5 %   Platelets 224 150 - 400 K/uL  Urinalysis, Routine w reflex microscopic  Result Value Ref Range   Color, Urine YELLOW YELLOW   APPearance CLEAR CLEAR   Specific Gravity, Urine 1.019 1.005 - 1.030   pH 6.0 5.0 - 8.0   Glucose, UA 150 (A) NEGATIVE mg/dL   Hgb urine dipstick NEGATIVE NEGATIVE   Bilirubin Urine NEGATIVE NEGATIVE   Ketones, ur 5 (A) NEGATIVE mg/dL   Protein, ur NEGATIVE NEGATIVE mg/dL   Nitrite NEGATIVE NEGATIVE   Leukocytes, UA NEGATIVE NEGATIVE   Laboratory interpretation all normal except Stable renal insufficiency    EKG  EKG Interpretation None       Radiology Dg Chest 2 View  Result Date: 12/18/2016 CLINICAL DATA:  Initial evaluation for acute mid lower chest pain. EXAM: CHEST  2 VIEW COMPARISON:  Prior radiograph from 09/13/2015. FINDINGS: Median sternotomy wires underlying CABG markers noted. Stable cardiomegaly. Mediastinal silhouette normal. Large hiatal hernia, similar to prior. Lungs mildly hypoinflated. Left basilar atelectasis related to the large hiatal hernia. No focal infiltrates identified. No pulmonary edema or pleural effusion. No pneumothorax. No acute osseous abnormality. IMPRESSION: 1. Large hiatal hernia with associated left basilar atelectasis. 2.  No other active cardiopulmonary disease. 3. Stable cardiomegaly with sequelae of prior CABG. No pulmonary edema. Electronically Signed   By: Jeannine Boga M.D.   On: 12/18/2016 00:57   Dg Abd 2 Views  Result Date: 12/18/2016 CLINICAL DATA:  Initial evaluation for acute abdominal pain. Abdomen feels hard on right side. EXAM: ABDOMEN - 2 VIEW COMPARISON:  None. FINDINGS: Bowel gas pattern within normal limits without evidence for  obstruction or ileus. No abnormal bowel Ola thickening. No free air on upright projection. Large amount of retained stool diffusely throughout the colon, suggesting constipation. No soft tissue mass or abnormal calcification. Surgical clips overlie the pelvis. Scoliosis with advanced multilevel degenerative changes noted within the visualized spine. No acute osseus abnormality. IMPRESSION: 1. Nonobstructive bowel gas pattern with no radiographic evidence for acute intra-abdominal process. 2. Large amount of retained stool diffusely throughout the colon, suggesting constipation. 3. Scoliosis with advanced multilevel degenerative spondylolysis. Electronically Signed   By: Jeannine Boga M.D.   On: 12/18/2016 00:54    Procedures Procedures (including critical care time)  Medications Ordered in ED Medications  ondansetron (ZOFRAN) injection 4 mg (4 mg Intravenous Given 12/17/16 2310)  sodium chloride 0.9 % bolus 1,000 mL (0 mLs Intravenous Stopped 12/18/16 0146)     Initial Impression / Assessment and Plan / ED Course  I have reviewed the triage vital signs and the nursing notes.  Pertinent labs & imaging results that were available during my care of the patient were reviewed by me and considered in my medical decision making (see chart for details).  DIAGNOSTIC STUDIES:  Oxygen Saturation is 95% on RA, adequate by my interpretation.    COORDINATION OF CARE:  11:26 PM Discussed treatment plan with pt at bedside which includes blood work and Xray imaging and pt agreed to plan. Abdominal x-rays were done to evaluate his complaints of abdominal pain, and chest x-ray was done due to possibility of choking and aspiration. Patient was given IV fluid bolus and IV Zofran for his nausea.  Patient was rechecked at 1:30 AM. He states his symptoms have all resolved, he has no abdominal pain. He denies any coughing or feeling short of breath. He is lying flat in bed in no distress. At this point if he  aspirated he has no clinical or x-ray evidence of aspiration pneumonia. Patient will be advised to return if he gets a fever, cough, or worsening shortness of breath. When I reexamine his abdomen the hard mass like area in his right abdomen is now soft and nontender. We discussed things to do for his complaints of gas, patient is noted to have a stable large hiatal hernia which probably attributes to his frequent feelings that he needs to burp and can't. We also discussed things to do for his constipation to help his stool moved through his right colon however he appears to be doing that on his own. Son states he's not sure how well he cooked oysters tonight. They were canned oysters that he had open the night before and eaten. He did store them in a refrigerator. However it is not clear how long he cooked them to make sure they were fully cooked.    Final Clinical Impressions(s) / ED Diagnoses   Final diagnoses:  Generalized abdominal pain  Hiatal hernia  Non-intractable vomiting with nausea, unspecified vomiting type    New Prescriptions New Prescriptions   ONDANSETRON (ZOFRAN) 4 MG TABLET    Take 1 tablet (4 mg total) by mouth every  8 (eight) hours as needed.   Plan discharge  Rolland Porter, MD, FACEP  I personally performed the services described in this documentation, which was scribed in my presence. The recorded information has been reviewed and considered.  Rolland Porter, MD, Barbette Or, MD 12/18/16 [phone]

## 2016-12-17 NOTE — ED Notes (Signed)
Patient states abdominal pain with nausea started tonight at 2000 after eating dinner. Patient denies active vomiting but states "I feel like I need to" also denies eating anything abnormal

## 2016-12-17 NOTE — ED Notes (Signed)
Family member at bedside, no needs voiced at this time

## 2016-12-18 LAB — URINALYSIS, ROUTINE W REFLEX MICROSCOPIC
Bilirubin Urine: NEGATIVE
GLUCOSE, UA: 150 mg/dL — AB
Hgb urine dipstick: NEGATIVE
KETONES UR: 5 mg/dL — AB
LEUKOCYTES UA: NEGATIVE
NITRITE: NEGATIVE
PROTEIN: NEGATIVE mg/dL
Specific Gravity, Urine: 1.019 (ref 1.005–1.030)
pH: 6 (ref 5.0–8.0)

## 2016-12-18 MED ORDER — ONDANSETRON HCL 4 MG PO TABS: 4.0000 mg | ORAL_TABLET | Freq: Three times a day (TID) | ORAL | 0 refills | Status: DC | PRN

## 2016-12-18 NOTE — ED Notes (Signed)
Patient resting in bed in a position of stated comfort. Family at bedside. No needs voiced.

## 2016-12-18 NOTE — ED Notes (Signed)
Patient verbalizes understanding of discharge instructions, prescriptions, home care and follow up care. Patient out of department at this time with family. 

## 2016-12-18 NOTE — Discharge Instructions (Signed)
Recheck if you get a fever, coughing, struggle to breathe or your abdominal pain and vomiting return. Use the zofran for nausea if needed. You can take gaviscon or simethicone for gas buildup. If you get constipated you can get miralax and put one dose or 17 g in 8 ounces of water,  take 1 dose every 30 minutes for 2-3 hours or until you  get good results and then once or twice daily to prevent constipation.

## 2016-12-20 ENCOUNTER — Encounter: Payer: Self-pay | Admitting: Pediatrics

## 2016-12-20 ENCOUNTER — Ambulatory Visit (INDEPENDENT_AMBULATORY_CARE_PROVIDER_SITE_OTHER): Payer: Medicare PPO | Admitting: Pediatrics

## 2016-12-20 ENCOUNTER — Other Ambulatory Visit: Payer: Self-pay | Admitting: *Deleted

## 2016-12-20 ENCOUNTER — Ambulatory Visit (INDEPENDENT_AMBULATORY_CARE_PROVIDER_SITE_OTHER): Payer: Medicare PPO

## 2016-12-20 VITALS — BP 145/90 | HR 101 | Temp 97.7°F | Ht 71.0 in | Wt 158.2 lb

## 2016-12-20 DIAGNOSIS — R112 Nausea with vomiting, unspecified: Secondary | ICD-10-CM

## 2016-12-20 DIAGNOSIS — R110 Nausea: Secondary | ICD-10-CM

## 2016-12-20 DIAGNOSIS — K5900 Constipation, unspecified: Secondary | ICD-10-CM

## 2016-12-20 MED ORDER — ONDANSETRON HCL 4 MG PO TABS: 4.0000 mg | ORAL_TABLET | Freq: Two times a day (BID) | ORAL | 0 refills | Status: DC | PRN

## 2016-12-20 NOTE — Progress Notes (Signed)
  Subjective:   Patient ID: [name], male    DOB: December 13, 1926, 83 y.o.   MRN: [id number] CC: Nausea; Emesis; and Constipation (last BM Tuesday 05/01)  HPI: [name] is a 83 y.o. male presenting for Nausea; Emesis; and Constipation (last BM Tuesday 05/01)  Seen in the ED 3 days ago for abd pain and episode of vomiting Diagnosed with constipation Had CXR and ABD XR showing large amount of stool in colon Last stool was 2 days ago Drank 3 ensures yesterday in addition to some water Not able to drink much today  Talked with son by phone with pt permission Thinks he hasnt been eating well for past 3 days Had oysters 3 days ago for dinner, son thinks he ate bad oysters 3 days ago  Relevant past medical, surgical, family and social history reviewed. Allergies and medications reviewed and updated. History  Smoking Status  . Former Smoker  . Packs/day: 1.00  . Types: Cigarettes  . Quit date: 08/20/1978  Smokeless Tobacco  . Never Used   ROS: Per HPI   Objective:    BP (!) 145/90   Pulse (!) 101   Temp 97.7 F (36.5 C) (Oral)   Ht 5\' 11"  (1.803 m)   Wt 158 lb 3.2 oz (71.8 kg)   BMI 22.06 kg/m   Wt Readings from Last 3 Encounters:  12/20/16 158 lb 3.2 oz (71.8 kg)  12/17/16 160 lb (72.6 kg)  12/04/16 158 lb (71.7 kg)    Gen: NAD, alert, cooperative with exam, NCAT EYES: EOMI, no conjunctival injection, or no icterus CV: NRRR, normal S1/S2, no murmur, distal pulses 2+ b/l Resp: CTABL, no wheezes, normal WOB Abd: +BS, soft, mildly distended, mildly ttp throughout, no guarding or organomegaly Ext: No edema, warm Neuro: Alert and oriented  Preliminary read by Assunta Found, MD:  Colon with large amount of stool  Assessment & Plan:  [name] was seen today for nausea, emesis and constipation.  Diagnoses and all orders for this visit:  Nausea -     DG Abd 1 View; Future  Nausea and vomiting, intractability of vomiting not specified, unspecified vomiting type -     DG Abd  1 View; Future  Constipation, unspecified constipation type Likely cause of nausea No vomiting since 2 days ago Large amount of stool on KUB Discussed bowel clean out Gave addition 6 tabs of zofran, take BID If any worsening, if not passing stool and abd pain improving needs to be seen -     DG Abd 1 View; Future   Follow up plan: Return in about 1 week (around 12/27/2016). Assunta Found, MD [place]

## 2016-12-20 NOTE — Patient Instructions (Addendum)
Mix miralax with gatorade or other fluids every 3 hours until you have passed large amounts of stool  OK to take milk of magnesia today  If belly pain not improving, if you are not passing stool, we need to know  Drink glass of fluid with each meal

## 2017-02-28 NOTE — Anesthesia Postprocedure Evaluation (Signed)
Anesthesia Post Note  Patient: [name]  Procedure(s) Performed: Procedure(s) (LRB): REPAIR LEFT INGUINAL HERNIA WITH MESH (Left) INSERTION OF MESH (Left)     Anesthesia Post Evaluation  Last Vitals:  Vitals:   10/03/16 0227 10/03/16 0511  BP: (!) 125/55 121/60  Pulse: 75 71  Resp: 18 18  Temp: 36.7 C 36.5 C    Last Pain:  Vitals:   10/03/16 0511  TempSrc: Oral  PainSc:                  Montez Hageman

## 2017-02-28 NOTE — Addendum Note (Signed)
Addendum  created 02/28/17 1401 by Montez Hageman, MD   Sign clinical note

## 2017-03-11 ENCOUNTER — Other Ambulatory Visit: Payer: Self-pay | Admitting: Family Medicine

## 2017-03-15 ENCOUNTER — Ambulatory Visit (INDEPENDENT_AMBULATORY_CARE_PROVIDER_SITE_OTHER): Payer: Medicare PPO | Admitting: *Deleted

## 2017-03-15 VITALS — BP 151/71 | HR 69 | Ht 69.0 in | Wt 153.0 lb

## 2017-03-15 DIAGNOSIS — Z0000 Encounter for general adult medical examination without abnormal findings: Secondary | ICD-10-CM

## 2017-03-15 NOTE — Progress Notes (Addendum)
Subjective:   [name] is a 83 y.o. male who presents for an Initial Medicare Annual Wellness Visit. [name] lives at home alone. His wife passed away suddenly 5 years ago. 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 He has one son and two grandchildren. He has 2 dogs. He does not participate in any community or church groups.   Review of Systems   General:Reports that his health is worse than last year due to leg/knee weakness.  Reports a decrease in appetite and weight loss over past month. No associated symptoms. Chart review shows a 5 lb weight loss since last visit in May 2018.   GI: Abd pain and N/V that he was seen for in May has resolved. Drinking prune juice to help with constipation. Last BM yesterday and normal. Denies any black tarry stools or frank blood.   Cardiac Risk Factors include: advanced age (>69men, >31 women);dyslipidemia;male gender;hypertension;sedentary lifestyle  Musculoskeletal: Bilateral knee weakness and numbness. Also has some numbness in his feet and a sensation that they are swollen. He sees Dr Latanya Maudlin and has had both knees replaced. Denies pain.   Urinary: Urgency and frequency. Some incontinence related to urgency. Hx of radical prostatectomy 16 years ago. Has not seen urology for urgency and frequency.  Other systems negative.    Objective:    Today's Vitals   03/15/17 0830  BP: (!) 151/71  Pulse: 69  Weight: 153 lb (69.4 kg)  Height: 5\' 9"  (1.753 m)   Body mass index is 22.59 kg/m.  Current Medications (verified) Outpatient Encounter Prescriptions as of 03/15/2017  Medication Sig  . acetaminophen (TYLENOL) 325 MG tablet Take 325-650 mg by mouth every 6 (six) hours as needed (for pain.).  . Ca Carbonate-Mag Hydroxide (ROLAIDS PO) Take 1-2 tablets by mouth 3 (three) times daily as needed (for heartburn/indigestion).  . Cholecalciferol (VITAMIN D3) 1000 UNITS CAPS Take 1,000 Units  by mouth every morning.   . clindamycin (CLEOCIN) 150 MG capsule 3 day course starting on 12/12/2016  . diltiazem (CARDIZEM CD) 240 MG 24 hr capsule TAKE 1 CAPSULE EVERY DAY  . ELIQUIS 2.5 MG TABS tablet TAKE 1 TABLET TWICE DAILY  . furosemide (LASIX) 40 MG tablet TAKE 1 TABLET EVERY DAY  . levothyroxine (SYNTHROID, LEVOTHROID) 25 MCG tablet TAKE 1 TABLET EVERY DAY BEFORE BREAKFAST  . nitroGLYCERIN (NITROSTAT) 0.4 MG SL tablet Place 1 tablet (0.4 mg total) under the tongue every 5 (five) minutes as needed for chest pain.  Marland Kitchen omeprazole (PRILOSEC) 40 MG capsule TAKE 1 CAPSULE EVERY DAY  . oxybutynin (DITROPAN) 5 MG tablet TAKE 1 TABLET EVERY DAY  . oxymetazoline (AFRIN) 0.05 % nasal spray Place 1 spray into both nostrils 2 (two) times daily as needed for congestion.  . pramipexole (MIRAPEX) 1 MG tablet Take 1 mg by mouth at bedtime.  . pravastatin (PRAVACHOL) 40 MG tablet TAKE 1 TABLET EVERY DAY (Patient taking differently: TAKE 1 TABLET EVERY DAY AT BEDTIME)  . traMADol (ULTRAM) 50 MG tablet Take 1 tablet (50 mg total) by mouth every 6 (six) hours as needed for moderate pain.  . traZODone (DESYREL) 100 MG tablet TAKE 1/2 TABLET AT BEDTIME AS NEEDED  . [DISCONTINUED] ondansetron (ZOFRAN) 4 MG tablet Take 1 tablet (4 mg total) by mouth every 12 (twelve) hours as needed.   No facility-administered encounter medications on file as of 03/15/2017.     Allergies (verified) Lipitor [atorvastatin];  Penicillins; and Doxycycline   History: Past Medical History:  Diagnosis Date  . Arthritis   . Atrial fibrillation and flutter ([place])   . Choledocholithiasis   . CKD (chronic kidney disease) 08/2013   stage 3  . Coronary artery disease   . Dysrhythmia    atrial fib  . GERD (gastroesophageal reflux disease)    Large HH with intrathoracic component.   . H/O hiatal hernia   . Hyperlipemia   . Hypertension   . Hypothyroidism   . Pneumonia ~ 1935  . Prostate cancer [facility])    Past Surgical History:    Procedure Laterality Date  . CATARACT EXTRACTION Bilateral   . COLONOSCOPY  2003, 01/2012   non-bleeding cecal AVMs, descending and sigmoid tics.   . CORONARY ANGIOPLASTY    . CORONARY ARTERY BYPASS GRAFT N/A 09/03/2013   Procedure: CORONARY ARTERY BYPASS GRAFTING times four using left internal mammary and bilateral saphenous vein.;  Surgeon: Ivin Poot, MD;  Location: [facility];  Service: Open Heart Surgery;  Laterality: N/A;  . ERCP N/A 04/04/2015   Procedure: ENDOSCOPIC RETROGRADE CHOLANGIOPANCREATOGRAPHY (ERCP);  Surgeon: Inda Castle, MD;  Location: [facility];  Service: Endoscopy;  Laterality: N/A;  . ERCP N/A 04/06/2015   Procedure: ENDOSCOPIC RETROGRADE CHOLANGIOPANCREATOGRAPHY (ERCP);  Surgeon: Inda Castle, MD;  Location: [facility];  Service: Endoscopy;  Laterality: N/A;  . ESOPHAGOGASTRODUODENOSCOPY  01/2012   11 cm sliding hiatal hernia.  Marland Kitchen EYE SURGERY     cataracts removed, ? IOL  . HERNIA REPAIR     abdominal?  . INGUINAL HERNIA REPAIR Left 10/02/2016  . INGUINAL HERNIA REPAIR Left 10/02/2016   Procedure: REPAIR LEFT INGUINAL HERNIA WITH MESH;  Surgeon: Georganna Skeans, MD;  Location: [facility];  Service: General;  Laterality: Left;  . INSERTION OF MESH Left 10/02/2016   Procedure: INSERTION OF MESH;  Surgeon: Georganna Skeans, MD;  Location: [facility];  Service: General;  Laterality: Left;  . INTRAOPERATIVE TRANSESOPHAGEAL ECHOCARDIOGRAM N/A 09/03/2013   Procedure: INTRAOPERATIVE TRANSESOPHAGEAL ECHOCARDIOGRAM;  Surgeon: Ivin Poot, MD;  Location: [facility];  Service: Open Heart Surgery;  Laterality: N/A;  . LEFT HEART CATHETERIZATION WITH CORONARY ANGIOGRAM N/A 08/03/2013   Procedure: LEFT HEART CATHETERIZATION WITH CORONARY ANGIOGRAM;  Surgeon: Blane Ohara, MD;  Location: [facility] CATH LAB;  Service: Cardiovascular;  Laterality: N/A;  . PROSTATE SURGERY    . THORACOTOMY Right As a young child, ? 52 years old   for pneumonia   . TOTAL KNEE ARTHROPLASTY Right 03/11/2014    Procedure: TOTAL KNEE ARTHROPLASTY;  Surgeon: Hessie Dibble, MD;  Location: [facility];  Service: Orthopedics;  Laterality: Right;  . TOTAL KNEE ARTHROPLASTY Left 09/20/2015   Procedure: TOTAL KNEE ARTHROPLASTY;  Surgeon: Melrose Nakayama, MD;  Location: [facility];  Service: Orthopedics;  Laterality: Left;   Family History  Problem Relation Age of Onset  . Cancer Mother        bone marrow  . Cancer Father   . Cancer Brother        skin  . Healthy Son   . Colon cancer Neg Hx    Social History   Occupational History  . Retired     Social History Main Topics  . Smoking status: Former Smoker    Packs/day: 1.00    Types: Cigarettes    Quit date: 08/20/1978  . Smokeless tobacco: Never Used  . Alcohol use No  . Drug use: No  . Sexual activity: No   Tobacco Counseling  No tobacco use  Activities of Daily Living In your present state of health, do you have any difficulty performing the following activities: 03/15/2017 10/02/2016  Hearing? N -  Vision? N -  Difficulty concentrating or making decisions? Y -  Walking or climbing stairs? Y -  Dressing or bathing? N -  Doing errands, shopping? N N  Preparing Food and eating ? N -  Using the Toilet? N -  In the past six months, have you accidently leaked urine? N -  Do you have problems with loss of bowel control? N -  Managing your Medications? N -  Managing your Finances? N -  Housekeeping or managing your Housekeeping? Y -  Some recent data might be hidden   Some trouble with short term memory Can't walk up and down stairs due to knee weakness  Immunizations and Health Maintenance Immunization History  Administered Date(s) Administered  . Influenza, High Dose Seasonal PF 05/25/2016  . Influenza,inj,Quad PF,36+ Mos 05/13/2013, 05/20/2014, 05/23/2015  . Pneumococcal Conjugate-13 07/27/2013  . Pneumococcal Polysaccharide-23 01/12/2015   There are no preventive care reminders to display for this patient.  Patient Care Team: Chipper Herb, MD as PCP - General (Family Medicine) Minus Breeding, MD as Consulting Physician (Cardiology) Melrose Nakayama, MD as Consulting Physician (Orthopedic Surgery) Warden Fillers, MD as Consulting Physician (Ophthalmology)  Georganna Skeans, MD as consulting physician (surgery)  Patient had an ER visit 12/17/16 for abd pain with N/V. Diagnosed with constipation. This has resolved. Inguinal hernia repair 10/02/16.     Assessment:   This is a routine wellness examination for [place].   Hearing/Vision screen No deficits noted during visit. Last eye exam was approximately 2 years ago. Sees Dr Katy Fitch and plans to see him soon.   Dietary issues and exercise activities discussed: Current Exercise Habits: The patient does not participate in regular exercise at present, Exercise limited by: orthopedic condition(s)  Goals    . Have 3 meals a day          Try to eat 3 meals a day. Lean protein, fruits, and vegetables      Depression Screen PHQ 2/9 Scores 03/15/2017 12/20/2016 12/04/2016 11/22/2016  PHQ - 2 Score 0 0 0 0  Denies depression or feeling sad but says that he doesn't feel like doing things because of his knees. His affect seemed appropriate during the visit but he did look down when talking about depression. He also seemed sad when talking about his wife's death.  Should probably follow up on this.   Fall Risk Fall Risk  03/15/2017 12/20/2016 12/04/2016 11/22/2016 08/07/2016  Falls in the past year? No No No No No  Number falls in past yr: - - - - -  Injury with Fall? - - - - -  Risk for fall due to : - - - - -    Cognitive Function: MMSE - Mini Mental State Exam 03/15/2017  Not completed: (No Data)  Orientation to time 5  Orientation to Place 5  Registration 3  Attention/ Calculation 0  Attention/Calculation-comments not attempted  Recall 1  Language- name 2 objects 1  Language- repeat 1  Language- follow 3 step command 3  Language- read & follow direction 1  Write a  sentence 1  Copy design 1  Total score 22    Probably normal exam. Patient didn't attempt to spell "world" backwards but other questions answered appropriately and he didn't seem to have any difficulty answering other questions during the  visit.    Screening Tests Health Maintenance  Topic Date Due  . INFLUENZA VACCINE  03/20/2017  . TETANUS/TDAP  04/20/2021  . PNA vac Low Risk Adult  Completed       Advance Directives discussed. Patient declined at this time. Says that he has talked with his son and that he knows his wishes. Plan:  Keep f/u with Dr Laurance Flatten in August. Schedule appt sooner if necessary.  Let us know if weight loss continues or if he develops any abd pain, N/V, etc.  Consider urology referral for urinary frequency and urge incontinence  Empty bladder every 2 hours I will discuss leg weakness with Dr Laurance Flatten and follow up with patient next week.  Move slowly and carefully to avoid falls Try to eat 3 meals a day-lean protein, fruits, and vegetables. Ensure is a good supplement.   I have personally reviewed and noted the following in the patient's chart:   . Medical and social history . Use of alcohol, tobacco or illicit drugs  . Current medications and supplements . Functional ability and status . Nutritional status . Physical activity . Advanced directives . List of other physicians . Hospitalizations, surgeries, and ER visits in previous 12 months . Vitals . Screenings to include cognitive, depression, and falls . Referrals and appointments  In addition, I have reviewed and discussed with patient certain preventive protocols, quality metrics, and best practice recommendations. A written personalized care plan for preventive services as well as general preventive health recommendations were provided to patient.     Chong Sicilian, RN   03/15/2017      I have reviewed and agree with the above AWV documentation.  Claretta Fraise, M.D.

## 2017-03-15 NOTE — Patient Instructions (Addendum)
   [name] ,  Thank you for taking time to come for your Medicare Wellness Visit. I appreciate your ongoing commitment to your health goals. Please review the following plan we discussed and let me know if I can assist you in the future.   These are the goals we discussed: Goals    . Have 3 meals a day          Try to eat 3 meals a day. Lean protein, fruits, and vegetables       This is a list of the screening recommended for you and due dates:  Health Maintenance  Topic Date Due  . Flu Shot  03/20/2017  . Tetanus Vaccine  04/20/2021  . Pneumonia vaccines  Completed   Let us know if you have any more weight loss or if you develop any stomach pain, nausea, or vomiting.  Continue to stay active. I will talk with Dr Laurance Flatten about your knees and follow up with you next week. Empty your bladder every 2 hours to help with urge to urinate frequently.  Let us know if you would like to see a urologist about urinary frequency. Move slowly and carefully to avoid falls.   Keep follow up with Dr Laurance Flatten in August. If you need to see him sooner please let us know.

## 2017-03-19 ENCOUNTER — Telehealth: Payer: Self-pay | Admitting: *Deleted

## 2017-03-19 NOTE — Telephone Encounter (Signed)
[name] had an Annual Wellness Visit last week. During his visit he expressed concerns about weakness in his knees and some numbness in his feet. He also has a sensation that his feet are swollen even though they are not. Does not really complain of knee pain. He has a history of bilateral knee replacement by Dr Latanya Maudlin and is still followed by him. He is frustrated because this affects his mobility. I told him that I would mention it to you and see what you suggest. He has a follow scheduled with you but it's not until the end of August.

## 2017-03-19 NOTE — Telephone Encounter (Signed)
Please schedule patient to go back and see orthopedic surgeon for follow-up of this problem

## 2017-04-02 ENCOUNTER — Other Ambulatory Visit: Payer: Self-pay | Admitting: Family Medicine

## 2017-04-02 ENCOUNTER — Telehealth: Payer: Self-pay | Admitting: Cardiology

## 2017-04-02 MED ORDER — APIXABAN 2.5 MG PO TABS: 2.5000 mg | ORAL_TABLET | Freq: Two times a day (BID) | ORAL | 0 refills | Status: DC

## 2017-04-02 MED FILL — ELIQUIS 2.5 MG TABLET: 2.5 | 30 days supply | Qty: 60 | Fill #0

## 2017-04-02 NOTE — Telephone Encounter (Signed)
Pt aware Rx sent to pharmacy 

## 2017-04-02 NOTE — Telephone Encounter (Signed)
New message   Patient calling the office for samples of medication:   1.  What medication and dosage are you requesting samples for?ELIQUIS 2.5 MG TABS tablet  2.  Are you currently out of this medication? Only has some left for 2 more weeks

## 2017-04-02 NOTE — Telephone Encounter (Signed)
Spoke with Patient and informed him that we do not have any Eliquis samples at this time and he could check back with [place] next week. Patient voiced understanding.

## 2017-04-11 ENCOUNTER — Encounter: Payer: Self-pay | Admitting: Family Medicine

## 2017-04-11 ENCOUNTER — Ambulatory Visit (INDEPENDENT_AMBULATORY_CARE_PROVIDER_SITE_OTHER): Payer: Medicare PPO | Admitting: Family Medicine

## 2017-04-11 VITALS — BP 166/80 | HR 79 | Temp 96.9°F | Ht 69.0 in | Wt 154.0 lb

## 2017-04-11 DIAGNOSIS — K4091 Unilateral inguinal hernia, without obstruction or gangrene, recurrent: Secondary | ICD-10-CM | POA: Diagnosis not present

## 2017-04-11 DIAGNOSIS — I700 Atherosclerosis of aorta: Secondary | ICD-10-CM | POA: Diagnosis not present

## 2017-04-11 DIAGNOSIS — E784 Other hyperlipidemia: Secondary | ICD-10-CM

## 2017-04-11 DIAGNOSIS — Z951 Presence of aortocoronary bypass graft: Secondary | ICD-10-CM

## 2017-04-11 DIAGNOSIS — I10 Essential (primary) hypertension: Secondary | ICD-10-CM | POA: Insufficient documentation

## 2017-04-11 DIAGNOSIS — R32 Unspecified urinary incontinence: Secondary | ICD-10-CM

## 2017-04-11 DIAGNOSIS — C61 Malignant neoplasm of prostate: Secondary | ICD-10-CM | POA: Diagnosis not present

## 2017-04-11 DIAGNOSIS — N183 Chronic kidney disease, stage 3 unspecified: Secondary | ICD-10-CM

## 2017-04-11 DIAGNOSIS — R079 Chest pain, unspecified: Secondary | ICD-10-CM

## 2017-04-11 DIAGNOSIS — M25551 Pain in right hip: Secondary | ICD-10-CM | POA: Diagnosis not present

## 2017-04-11 DIAGNOSIS — R351 Nocturia: Secondary | ICD-10-CM

## 2017-04-11 DIAGNOSIS — E559 Vitamin D deficiency, unspecified: Secondary | ICD-10-CM

## 2017-04-11 DIAGNOSIS — E7849 Other hyperlipidemia: Secondary | ICD-10-CM

## 2017-04-11 HISTORY — DX: Chronic kidney disease, stage 3 unspecified: N18.30

## 2017-04-11 NOTE — Patient Instructions (Addendum)
Medicare Annual Wellness Visit  [facility] and the medical providers at [facility] strive to bring you the best medical care.  In doing so we not only want to address your current medical conditions and concerns but also to detect new conditions early and prevent illness, disease and health-related problems.    Medicare offers a yearly Wellness Visit which allows our clinical staff to assess your need for preventative services including immunizations, lifestyle education, counseling to decrease risk of preventable diseases and screening for fall risk and other medical concerns.    This visit is provided free of charge (no copay) for all Medicare recipients. The clinical pharmacists at [facility] have begun to conduct these Wellness Visits which will also include a thorough review of all your medications.    As you primary medical provider recommend that you make an appointment for your Annual Wellness Visit if you have not done so already this year.  You may set up this appointment before you leave today or you may call back ([phone]) and schedule an appointment.  Please make sure when you call that you mention that you are scheduling your Annual Wellness Visit with the clinical pharmacist so that the appointment may be made for the proper length of time.     Continue current medications. Continue good therapeutic lifestyle changes which include good diet and exercise. Fall precautions discussed with patient. If an FOBT was given today- please return it to our front desk. If you are over 23 years old - you may need Prevnar 47 or the adult Pneumonia vaccine.  **Flu shots are available--- please call and schedule a FLU-CLINIC appointment**  After your visit with [place] today you will receive a survey in the mail or online from [employer] regarding your care with [place]. Please take a moment to fill this out. Your feedback is very  important to [place] as you can help [place] better understand your patient needs as well as improve your experience and satisfaction. WE CARE ABOUT YOU!!!   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 Grandville Silos again because of the left inguinal hernia that may have recurred. Use warm wet compresses to the right hip area for several days 20 minutes 3 or 4 times daily and take Tylenol for pain, if the pain persists we will be glad to do an injection in this area The blood pressure today was also elevated. It was elevated on 2 occasions and the patient should come back in the next 2-3 weeks with some home readings for the nurse to recheck his blood pressure here.

## 2017-04-11 NOTE — Addendum Note (Signed)
Addended by: Zannie Cove on: 04/11/2017 10:04 AM   Modules accepted: Orders

## 2017-04-11 NOTE — Progress Notes (Signed)
Subjective:    Patient ID: [name], male    DOB: Dec 25, 1926, 83 y.o.   MRN: [id number]  HPI  Pt here for follow up and management of chronic medical problems which includes hyperlipidemia and hypertension. He is taking medication regularly.This patient has a history of a left inguinal hernia repair in December 2017. He also had left knee arthroplasty in February 2017. He comes in today complaining of some left-sided pain and he has had an x-ray in the past. The most recent x-ray in May of this year showed constipation. He also complains of right hip pain. The patient is alert and looks good and looks younger than his stated age. He denies any chest pain or shortness of breath. He denies any trouble with swallowing heartburn indigestion nausea vomiting diarrhea or blood in the stool. He has his ongoing chronic constipation and this is generally relieved with taking prune juice. He does have some complaints with voiding and that he has to get up 3 times at night and he has some occasional incontinence. Otherwise there are no symptoms. He is in agreement that we can set up an appointment with him to see the urologist that comes to [place] from [place].     Patient Active Problem List   Diagnosis Date Noted  . S/P left inguinal hernia repair 10/02/2016  . Neuropathy 05/25/2016  . Aortic atherosclerosis ([place]) 05/25/2016  . Primary osteoarthritis of left knee 09/20/2015  . Anemia, chronic renal failure 04/15/2015  . Edema 04/12/2015  . Hyperbilirubinemia   . Choledocholithiasis 04/06/2015  . Hypoxia   . Abdominal pain, epigastric   . Abnormal liver enzymes   . Abnormal LFTs   . Biliary sepsis   . Acute respiratory failure ([place])   . Severe sepsis ([place])   . Bacteremia due to Gram-negative bacteria   . Hiatal hernia   . Sepsis ([place]) 03/28/2015  . Incisional hernia, without obstruction or gangrene 08/17/2014  . Atrial flutter ([place]) 07/28/2014  . Palpitation 06/16/2014  . Right knee  DJD 03/11/2014  . S/P CABG x 4 09/03/2013  . Insomnia 07/27/2013  . Testosterone deficiency 07/27/2013  . Hyperlipidemia 06/10/2013  . Heart murmur 06/10/2013  . Dyspnea 06/10/2013  . Inguinal hernia, left. 04/04/2012  . Paraesophageal hiatal hernia 03/24/2012  . Prostate cancer ([place]) 01/15/2012  . Special screening for malignant neoplasms, colon 01/15/2012   Outpatient Encounter Prescriptions as of 04/11/2017  Medication Sig  . acetaminophen (TYLENOL) 325 MG tablet Take 325-650 mg by mouth every 6 (six) hours as needed (for pain.).  Marland Kitchen apixaban (ELIQUIS) 2.5 MG TABS tablet Take 1 tablet (2.5 mg total) by mouth 2 (two) times daily.  . Ca Carbonate-Mag Hydroxide (ROLAIDS PO) Take 1-2 tablets by mouth 3 (three) times daily as needed (for heartburn/indigestion).  . Cholecalciferol (VITAMIN D3) 1000 UNITS CAPS Take 1,000 Units by mouth every morning.   . clindamycin (CLEOCIN) 150 MG capsule 3 day course starting on 12/12/2016  . diltiazem (CARDIZEM CD) 240 MG 24 hr capsule TAKE 1 CAPSULE EVERY DAY  . furosemide (LASIX) 40 MG tablet TAKE 1 TABLET EVERY DAY  . levothyroxine (SYNTHROID, LEVOTHROID) 25 MCG tablet TAKE 1 TABLET EVERY DAY BEFORE BREAKFAST  . nitroGLYCERIN (NITROSTAT) 0.4 MG SL tablet Place 1 tablet (0.4 mg total) under the tongue every 5 (five) minutes as needed for chest pain.  Marland Kitchen omeprazole (PRILOSEC) 40 MG capsule TAKE 1 CAPSULE EVERY DAY  . oxybutynin (DITROPAN) 5 MG tablet TAKE 1 TABLET EVERY DAY  .  oxymetazoline (AFRIN) 0.05 % nasal spray Place 1 spray into both nostrils 2 (two) times daily as needed for congestion.  . pramipexole (MIRAPEX) 1 MG tablet Take 1 mg by mouth at bedtime.  . pravastatin (PRAVACHOL) 40 MG tablet TAKE 1 TABLET EVERY DAY (Patient taking differently: TAKE 1 TABLET EVERY DAY AT BEDTIME)  . traMADol (ULTRAM) 50 MG tablet Take 1 tablet (50 mg total) by mouth every 6 (six) hours as needed for moderate pain.  . traZODone (DESYREL) 100 MG tablet TAKE 1/2 TABLET  AT BEDTIME AS NEEDED   No facility-administered encounter medications on file as of 04/11/2017.      Review of Systems  Constitutional: Negative.   HENT: Negative.   Eyes: Negative.   Respiratory: Negative.   Cardiovascular: Negative.   Gastrointestinal: Negative.   Endocrine: Negative.   Genitourinary: Negative.   Musculoskeletal: Negative.        Left side pain - ongoing Right hip pain  Skin: Negative.   Allergic/Immunologic: Negative.   Neurological: Negative.   Hematological: Negative.   Psychiatric/Behavioral: Negative.        Objective:   Physical Exam  Constitutional: He is oriented to person, place, and time. He appears well-developed and well-nourished. No distress.  The patient is pleasant and alert and looks much younger than his stated age  HENT:  Head: Normocephalic and atraumatic.  Right Ear: External ear normal.  Left Ear: External ear normal.  Nose: Nose normal.  Mouth/Throat: Oropharynx is clear and moist. No oropharyngeal exudate.  Eyes: Pupils are equal, round, and reactive to light. Conjunctivae and EOM are normal. Right eye exhibits no discharge. Left eye exhibits no discharge. No scleral icterus.  He is up-to-date on his eye exams.  Neck: Normal range of motion. Neck supple. No thyromegaly present.  No thyroid enlargement adenopathy or bruits  Cardiovascular: Normal rate, regular rhythm, normal heart sounds and intact distal pulses.   No murmur heard. Heart is regular at 84/m with good pedal pulses  Pulmonary/Chest: Effort normal and breath sounds normal. No respiratory distress. He has no wheezes. He has no rales. He exhibits no tenderness.  Clear anteriorly and posteriorly and no axillary adenopathy  Abdominal: Soft. Bowel sounds are normal. He exhibits no mass. There is no tenderness. There is no rebound and no guarding.  No liver or spleen enlargement. No epigastric tenderness. No bruits and no inguinal adenopathy. The left inguinal  area seems to  have a bulge here. Upon standing it appears that his left inguinal hernia may have recurred. We will have him see the surgeon about this.  Musculoskeletal: Normal range of motion. He exhibits no edema or tenderness.  There is tenderness in the right lateral hip area but fairly good leg raising and hip motion. Since this is only been going on for short period time we will encourage the patient to take Tylenol and use warm wet compresses over this area and if it does not get better he'll need to come back for an injection.  Lymphadenopathy:    He has no cervical adenopathy.  Neurological: He is alert and oriented to person, place, and time. He has normal reflexes. No cranial nerve deficit.  Skin: Skin is warm and dry. No rash noted.  Psychiatric: He has a normal mood and affect. His behavior is normal. Judgment and thought content normal.  Nursing note and vitals reviewed.  BP (!) 150/81 (BP Location: Left Arm)   Pulse 79   Temp (!) 96.9 F (36.1 C) (Oral)  Ht '5\' 9"'  (1.753 m)   Wt 154 lb (69.9 kg)   BMI 22.74 kg/m         Assessment & Plan:  1. Other hyperlipidemia -Continue with current treatment and aggressive therapeutic lifestyle changes pending results of lab work - CBC with Differential/Platelet - Lipid panel  2. Essential hypertension -The blood pressure was elevated today on 2 occasions and the patient was instructed to check some home readings and bring readings back to the office for the nurse to recheck the blood pressure and 2-3 weeks. He should also watch his sodium intake. - CBC with Differential/Platelet - BMP8+EGFR - Hepatic function panel  3. Vitamin D deficiency -Continue vitamin D replacement pending results of lab work - CBC with Differential/Platelet - VITAMIN D 25 Hydroxy (Vit-D Deficiency, Fractures)  4. Prostate cancer [facility]) -The patient does have a history of prostate cancer he is currently having nocturia and lack of control. He is in agreement to go  see the urologist for further evaluation and management - CBC with Differential/Platelet - Ambulatory referral to Urology  5. Kidney disease, chronic, stage III (GFR 30-59 ml/min) -Continue to avoid all NSAIDs and keep blood pressure under the best control possible - CBC with Differential/Platelet - BMP8+EGFR  6. Nocturia - Ambulatory referral to Urology  7. Urinary incontinence, unspecified type - Ambulatory referral to Urology  8. Recurrent left inguinal hernia -Reschedule visit with Dr. Grandville Silos for follow-up  9. Right hip pain -This is probably a strain. No x-ray today. Use warm wet compresses 20 minutes 3 or 4 times daily and take Tylenol for pain. If the pain does not improve after the next couple weeks we can consider doing a shot of cortisone to the area that is hurting.  10. Left sided chest pain -This area has been hurting the patient for several years. On close examination there is a tiny BB-sized nodule that is very superficial and he says when he lays on this at night he has pain when he lays on it. We both decided that no further evaluation is necessary he will continue to monitor this.  11. Aortic atherosclerosis ([place]) -Continue with aggressive therapeutic lifestyle changes and cholesterol control  12. History of CABG -Follow-up with cardiology regularly as planned -The patient is having no chest pain or cardiac complaints at this time.   No orders of the defined types were placed in this encounter.  Patient Instructions                       Medicare Annual Wellness Visit  [facility] and the medical providers at [facility] strive to bring you the best medical care.  In doing so we not only want to address your current medical conditions and concerns but also to detect new conditions early and prevent illness, disease and health-related problems.    Medicare offers a yearly Wellness Visit which allows our clinical staff to assess your  need for preventative services including immunizations, lifestyle education, counseling to decrease risk of preventable diseases and screening for fall risk and other medical concerns.    This visit is provided free of charge (no copay) for all Medicare recipients. The clinical pharmacists at [facility] have begun to conduct these Wellness Visits which will also include a thorough review of all your medications.    As you primary medical provider recommend that you make an appointment for your Annual Wellness Visit if you have not done  so already this year.  You may set up this appointment before you leave today or you may call back ([phone]) and schedule an appointment.  Please make sure when you call that you mention that you are scheduling your Annual Wellness Visit with the clinical pharmacist so that the appointment may be made for the proper length of time.     Continue current medications. Continue good therapeutic lifestyle changes which include good diet and exercise. Fall precautions discussed with patient. If an FOBT was given today- please return it to our front desk. If you are over 31 years old - you may need Prevnar 53 or the adult Pneumonia vaccine.  **Flu shots are available--- please call and schedule a FLU-CLINIC appointment**  After your visit with [place] today you will receive a survey in the mail or online from [employer] regarding your care with [place]. Please take a moment to fill this out. Your feedback is very important to [place] as you can help [place] better understand your patient needs as well as improve your experience and satisfaction. WE CARE ABOUT YOU!!!   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 Grandville Silos again because of the left inguinal hernia that may have recurred. Use warm wet compresses to the right hip area for several days 20 minutes 3 or 4 times daily and take Tylenol for pain, if the pain persists we will be glad to do an injection in this area The blood pressure today was also elevated. It was elevated on 2 occasions and the patient should come back in the next 2-3 weeks with some home readings for the nurse to recheck his blood pressure here.    Arrie Senate MD

## 2017-04-12 LAB — CBC WITH DIFFERENTIAL/PLATELET
BASOS ABS: 0 10*3/uL (ref 0.0–0.2)
BASOS: 0 %
EOS (ABSOLUTE): 0.1 10*3/uL (ref 0.0–0.4)
Eos: 2 %
HEMATOCRIT: 40.4 % (ref 37.5–51.0)
Hemoglobin: 13.3 g/dL (ref 13.0–17.7)
Immature Grans (Abs): 0 10*3/uL (ref 0.0–0.1)
Immature Granulocytes: 0 %
LYMPHS ABS: 0.7 10*3/uL (ref 0.7–3.1)
Lymphs: 11 %
MCH: 31.1 pg (ref 26.6–33.0)
MCHC: 32.9 g/dL (ref 31.5–35.7)
MCV: 94 fL (ref 79–97)
Monocytes Absolute: 0.5 10*3/uL (ref 0.1–0.9)
Monocytes: 8 %
NEUTROS ABS: 5.3 10*3/uL (ref 1.4–7.0)
Neutrophils: 79 %
Platelets: 275 10*3/uL (ref 150–379)
RBC: 4.28 x10E6/uL (ref 4.14–5.80)
RDW: 14.3 % (ref 12.3–15.4)
WBC: 6.7 10*3/uL (ref 3.4–10.8)

## 2017-04-12 LAB — VITAMIN D 25 HYDROXY (VIT D DEFICIENCY, FRACTURES): VIT D 25 HYDROXY: 57.6 ng/mL (ref 30.0–100.0)

## 2017-04-12 LAB — HEPATIC FUNCTION PANEL
ALBUMIN: 4.4 g/dL (ref 3.5–4.7)
ALT: 13 IU/L (ref 0–44)
AST: 21 IU/L (ref 0–40)
Alkaline Phosphatase: 126 IU/L — ABNORMAL HIGH (ref 39–117)
Bilirubin Total: 0.7 mg/dL (ref 0.0–1.2)
Bilirubin, Direct: 0.24 mg/dL (ref 0.00–0.40)
TOTAL PROTEIN: 6.9 g/dL (ref 6.0–8.5)

## 2017-04-12 LAB — BMP8+EGFR
BUN / CREAT RATIO: 14 (ref 10–24)
BUN: 17 mg/dL (ref 8–27)
CO2: 27 mmol/L (ref 20–29)
Calcium: 10.3 mg/dL — ABNORMAL HIGH (ref 8.6–10.2)
Chloride: 100 mmol/L (ref 96–106)
Creatinine, Ser: 1.21 mg/dL (ref 0.76–1.27)
GFR, EST AFRICAN AMERICAN: 61 mL/min/{1.73_m2} (ref >59)
GFR, EST NON AFRICAN AMERICAN: 53 mL/min/{1.73_m2} — AB (ref >59)
Glucose: 86 mg/dL (ref 65–99)
POTASSIUM: 3.6 mmol/L (ref 3.5–5.2)
SODIUM: 141 mmol/L (ref 134–144)

## 2017-04-12 LAB — LIPID PANEL
Chol/HDL Ratio: 2.5 ratio (ref 0.0–5.0)
Cholesterol, Total: 123 mg/dL (ref 100–199)
HDL: 49 mg/dL (ref >39)
LDL Calculated: 53 mg/dL (ref 0–99)
Triglycerides: 106 mg/dL (ref 0–149)
VLDL Cholesterol Cal: 21 mg/dL (ref 5–40)

## 2017-04-12 LAB — DRAWING FEE

## 2017-04-18 ENCOUNTER — Telehealth: Payer: Self-pay | Admitting: Cardiology

## 2017-04-18 NOTE — Telephone Encounter (Signed)
New message  Pt call request to speak with RN to f/u on [employer] paperwork for eliquis. Please call back to discuss

## 2017-04-18 NOTE — Telephone Encounter (Signed)
Returned call to patient-patient states he needs a PA for his Eliquis.   Reports [employer] has faxed over paperwork.  Patient also wondering if there are an samples available at the [facility] office (where he sees Dr. Percival Spanish).  Advised there are no samples available at [facility] but would send message to [facility] office to check.   Also advised I would route to primary nurse to complete PA.  Patient aware and verbalized understanding.

## 2017-04-18 NOTE — Telephone Encounter (Signed)
Prior Josem Kaufmann was fill out and faxed to [facility].Marland Kitchen

## 2017-04-23 ENCOUNTER — Other Ambulatory Visit: Payer: Medicare PPO

## 2017-04-23 DIAGNOSIS — Z1212 Encounter for screening for malignant neoplasm of rectum: Secondary | ICD-10-CM

## 2017-04-26 LAB — FECAL OCCULT BLOOD, IMMUNOCHEMICAL: Fecal Occult Bld: NEGATIVE

## 2017-05-24 ENCOUNTER — Ambulatory Visit (INDEPENDENT_AMBULATORY_CARE_PROVIDER_SITE_OTHER): Payer: Medicare PPO | Admitting: *Deleted

## 2017-05-24 DIAGNOSIS — Z23 Encounter for immunization: Secondary | ICD-10-CM | POA: Diagnosis not present

## 2017-06-06 ENCOUNTER — Other Ambulatory Visit: Payer: Self-pay | Admitting: Family Medicine

## 2017-06-11 ENCOUNTER — Encounter: Payer: Self-pay | Admitting: Cardiology

## 2017-06-12 ENCOUNTER — Other Ambulatory Visit: Payer: Self-pay | Admitting: Family Medicine

## 2017-06-23 NOTE — Progress Notes (Signed)
HPI The patient presents for follow up of CAD.   He is s/p CABG in 2014.  Since I last saw him he had left inguinal hernia repair.  He still having problems with this.  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  He denies any cardiovascular symptoms.  He denies any chest pressure, neck or arm discomfort.  He never notices any palpitations, presyncope or syncope.  He has no PND or orthopnea.   Allergies  Allergen Reactions  . Lipitor [Atorvastatin] Other (See Comments)    "feel bad"  . Doxycycline Rash    Redness to face and hands  . Penicillins Itching    Has patient had a PCN reaction causing immediate rash, facial/tongue/throat swelling, SOB or lightheadedness with hypotension: Yes Has patient had a PCN reaction causing severe rash involving mucus membranes or skin necrosis: No Has patient had a PCN reaction that required hospitalization No Has patient had a PCN reaction occurring within the last 10 years: No If all of the above answers are "NO", then may proceed with Cephalosporin use.     Current Outpatient Medications  Medication Sig Dispense Refill  . acetaminophen (TYLENOL) 325 MG tablet Take 325-650 mg by mouth every 6 (six) hours as needed (for pain.).    Marland Kitchen apixaban (ELIQUIS) 2.5 MG TABS tablet Take 2.5 mg 2 (two) times daily by mouth.    . Ca Carbonate-Mag Hydroxide (ROLAIDS PO) Take 1-2 tablets by mouth 3 (three) times daily as needed (for heartburn/indigestion).    . Cholecalciferol (VITAMIN D3) 1000 UNITS CAPS Take 1,000 Units by mouth every morning.     . diltiazem (CARDIZEM CD) 240 MG 24 hr capsule Take 240 mg daily by mouth.    . furosemide (LASIX) 40 MG tablet Take 40 mg by mouth.    . levothyroxine (SYNTHROID, LEVOTHROID) 25 MCG tablet Take 25 mcg daily before breakfast by mouth.    . nitroGLYCERIN (NITROSTAT) 0.4 MG SL tablet Place 1 tablet (0.4 mg total) under the tongue every 5 (five) minutes as  needed for chest pain. 25 tablet prn  . omeprazole (PRILOSEC) 40 MG capsule Take 40 mg daily by mouth.    . oxybutynin (DITROPAN) 5 MG tablet Take 5 mg daily by mouth.    Marland Kitchen oxymetazoline (AFRIN) 0.05 % nasal spray Place 1 spray into both nostrils 2 (two) times daily as needed for congestion.    . pramipexole (MIRAPEX) 1 MG tablet Take 1 mg by mouth at bedtime.    . pravastatin (PRAVACHOL) 40 MG tablet Take 40 mg daily by mouth.    . traMADol (ULTRAM) 50 MG tablet Take 1 tablet (50 mg total) by mouth every 6 (six) hours as needed for moderate pain. 20 tablet 0  . traZODone (DESYREL) 100 MG tablet 100 mg. Take 1/2 tablet by mouth at bedtime if needed    . apixaban (ELIQUIS) 2.5 MG TABS tablet Take 1 tablet (2.5 mg total) 2 (two) times daily by mouth. 60 tablet 0  . ELIQUIS 2.5 MG TABS tablet TAKE 1 TABLET TWICE DAILY 180 tablet 0   No current facility-administered medications for this visit.     Past Medical History:  Diagnosis Date  . Arthritis   . Atrial fibrillation and flutter ([place])   . Choledocholithiasis   . CKD (chronic kidney disease) 08/2013   stage 3  . Coronary artery disease    CABG x 4 09/03/13 with LIMA  to LAD, SVG to diag, SVG sequential to OM 1 and OM2  . Dysrhythmia    atrial fib  . GERD (gastroesophageal reflux disease)    Large HH with intrathoracic component.   . H/O hiatal hernia   . Hyperlipemia   . Hypertension   . Hypothyroidism   . Pneumonia ~ 1935  . Prostate cancer [facility])     Past Surgical History:  Procedure Laterality Date  . CATARACT EXTRACTION Bilateral   . COLONOSCOPY  2003, 01/2012   non-bleeding cecal AVMs, descending and sigmoid tics.   . ESOPHAGOGASTRODUODENOSCOPY  01/2012   11 cm sliding hiatal hernia.  Marland Kitchen EYE SURGERY     cataracts removed, ? IOL  . HERNIA REPAIR     abdominal?  . INGUINAL HERNIA REPAIR Left 10/02/2016  . PROSTATE SURGERY    . THORACOTOMY Right As a young child, ? 21 years old   for pneumonia     ROS:   As stated in the  HPI and negative for all other systems.  PHYSICAL EXAM BP 138/82   Pulse 78   Ht 5\' 11"  (1.803 m)   Wt 154 lb (69.9 kg)   BMI 21.48 kg/m   GENERAL:  Well appearing NECK:  No jugular venous distention, waveform within normal limits, carotid upstroke brisk and symmetric, no bruits, no thyromegaly LUNGS:  Clear to auscultation bilaterally CHEST:  Well healed sternotomy scar. HEART:  PMI not displaced or sustained,S1 and S2 within normal limits, no S3, no S4, no clicks, no rubs, 2 out of 6 apical systolic murmur radiating slightly at the aortic outflow tract, no diastolic murmurs ABD:  Flat, positive bowel sounds normal in frequency in pitch, no bruits, no rebound, no guarding, no midline pulsatile mass, no hepatomegaly, no splenomegaly EXT:  2 plus pulses throughout, no edema, no cyanosis no clubbing   EKG:  Sinus rhythm, rate 78, leftward axis, premature ventricular contractions, no acute ST-T wave changes.  06/26/2017   Lab Results  Component Value Date   CHOL 123 04/11/2017   TRIG 106 04/11/2017   HDL 49 04/11/2017   LDLCALC 53 04/11/2017   Lab Results  Component Value Date   CREATININE 1.21 04/11/2017     ASSESSMENT AND PLAN  ATRIAL FLUTTER/FIB:   [name] has a CHA2DS2 - VASc score of 4 with a risk of stroke of 4%.  He rate control and anticoagulation.  CAD:   The patient has no new symptoms.  No change in therapy is indicated.   HTN:  His BP is at target.  No change no change in therapy.   AS/MR: He has had some aortic sclerosis and mild mitral regurgitation along with some mildly elevated pulmonary pressures.  However, he is not having any significant symptoms related to this.  No further imaging is planned.

## 2017-06-26 ENCOUNTER — Ambulatory Visit: Payer: Medicare PPO | Admitting: Cardiology

## 2017-06-26 ENCOUNTER — Encounter: Payer: Self-pay | Admitting: Cardiology

## 2017-06-26 VITALS — BP 138/82 | HR 78 | Ht 71.0 in | Wt 154.0 lb

## 2017-06-26 DIAGNOSIS — I482 Chronic atrial fibrillation, unspecified: Secondary | ICD-10-CM

## 2017-06-26 DIAGNOSIS — I10 Essential (primary) hypertension: Secondary | ICD-10-CM

## 2017-06-26 MED ORDER — APIXABAN 2.5 MG PO TABS: 2.5000 mg | ORAL_TABLET | Freq: Two times a day (BID) | ORAL | 0 refills | Status: DC

## 2017-06-26 NOTE — Patient Instructions (Signed)
Medication Instructions:  The current medical regimen is effective;  continue present plan and medications.  Follow-Up: Follow up in 1 year with Dr. Hochrein in [place].  You will receive a letter in the mail 2 months before you are due.  Please call us when you receive this letter to schedule your follow up appointment.  If you need a refill on your cardiac medications before your next appointment, please call your pharmacy.  Thank you for choosing [facility]!!     

## 2017-07-02 ENCOUNTER — Ambulatory Visit: Payer: Medicare PPO | Admitting: Urology

## 2017-07-02 DIAGNOSIS — R3915 Urgency of urination: Secondary | ICD-10-CM

## 2017-07-02 DIAGNOSIS — R351 Nocturia: Secondary | ICD-10-CM

## 2017-07-02 DIAGNOSIS — N3941 Urge incontinence: Secondary | ICD-10-CM

## 2017-07-02 DIAGNOSIS — Z8546 Personal history of malignant neoplasm of prostate: Secondary | ICD-10-CM | POA: Diagnosis not present

## 2017-07-02 DIAGNOSIS — E291 Testicular hypofunction: Secondary | ICD-10-CM

## 2017-07-02 DIAGNOSIS — R350 Frequency of micturition: Secondary | ICD-10-CM | POA: Diagnosis not present

## 2017-07-12 ENCOUNTER — Ambulatory Visit: Payer: Medicare PPO | Admitting: Family Medicine

## 2017-07-12 ENCOUNTER — Encounter: Payer: Self-pay | Admitting: Family Medicine

## 2017-07-12 VITALS — BP 132/69 | HR 81 | Temp 97.0°F | Ht 71.0 in | Wt 158.0 lb

## 2017-07-12 DIAGNOSIS — M79661 Pain in right lower leg: Secondary | ICD-10-CM | POA: Diagnosis not present

## 2017-07-12 DIAGNOSIS — M7989 Other specified soft tissue disorders: Secondary | ICD-10-CM

## 2017-07-12 NOTE — Patient Instructions (Signed)
Apply heat locally. Walking as tolerated. Follow-up with orthopedist if needed

## 2017-07-12 NOTE — Progress Notes (Signed)
Patient ID: [name], male   DOB: 31-Oct-1926, 83 y.o.   MRN: [id number] Patient here today for pain, bruising and swelling of his right lower leg.  Symptoms have been present for about 1 week.  There was no history of injury or trauma.  He is status post lateral total knee replacements.  Right knee is generally larger than left.  There is been no history of redness or increased temperature in either knee.  He does take Eliquis for atrial flutter.    Patient Active Problem List   Diagnosis Date Noted  . Kidney disease, chronic, stage III (GFR 30-59 ml/min) ([place]) 04/11/2017  . Essential hypertension 04/11/2017  . S/P left inguinal hernia repair 10/02/2016  . Neuropathy 05/25/2016  . Aortic atherosclerosis ([place]) 05/25/2016  . Primary osteoarthritis of left knee 09/20/2015  . Anemia, chronic renal failure 04/15/2015  . Edema 04/12/2015  . Hyperbilirubinemia   . Choledocholithiasis 04/06/2015  . Hypoxia   . Abdominal pain, epigastric   . Abnormal liver enzymes   . Abnormal LFTs   . Biliary sepsis   . Acute respiratory failure ([place])   . Severe sepsis ([place])   . Bacteremia due to Gram-negative bacteria   . Hiatal hernia   . Sepsis ([place]) 03/28/2015  . Incisional hernia, without obstruction or gangrene 08/17/2014  . Atrial flutter ([place]) 07/28/2014  . Palpitation 06/16/2014  . Right knee DJD 03/11/2014  . Hx of CABG 09/03/2013  . Insomnia 07/27/2013  . Testosterone deficiency 07/27/2013  . Other hyperlipidemia 06/10/2013  . Heart murmur 06/10/2013  . Dyspnea 06/10/2013  . Inguinal hernia, left. 04/04/2012  . Paraesophageal hiatal hernia 03/24/2012  . Prostate cancer ([place]) 01/15/2012  . Special screening for malignant neoplasms, colon 01/15/2012   Outpatient Encounter Medications as of 07/12/2017  Medication Sig  . acetaminophen (TYLENOL) 325 MG tablet Take 325-650 mg by mouth every 6 (six) hours as needed (for pain.).  . Ca Carbonate-Mag Hydroxide (ROLAIDS PO) Take 1-2 tablets by  mouth 3 (three) times daily as needed (for heartburn/indigestion).  . Cholecalciferol (VITAMIN D3) 1000 UNITS CAPS Take 1,000 Units by mouth every morning.   . diltiazem (CARDIZEM CD) 240 MG 24 hr capsule Take 240 mg daily by mouth.  . ELIQUIS 2.5 MG TABS tablet TAKE 1 TABLET TWICE DAILY  . furosemide (LASIX) 40 MG tablet Take 40 mg by mouth.  . levothyroxine (SYNTHROID, LEVOTHROID) 25 MCG tablet Take 25 mcg daily before breakfast by mouth.  Marland Kitchen omeprazole (PRILOSEC) 40 MG capsule Take 40 mg daily by mouth.  . oxybutynin (DITROPAN) 5 MG tablet Take 5 mg daily by mouth.  Marland Kitchen oxymetazoline (AFRIN) 0.05 % nasal spray Place 1 spray into both nostrils 2 (two) times daily as needed for congestion.  . pramipexole (MIRAPEX) 1 MG tablet Take 1 mg by mouth at bedtime.  . pravastatin (PRAVACHOL) 40 MG tablet Take 40 mg daily by mouth.  . traMADol (ULTRAM) 50 MG tablet Take 1 tablet (50 mg total) by mouth every 6 (six) hours as needed for moderate pain.  . traZODone (DESYREL) 100 MG tablet 100 mg. Take 1/2 tablet by mouth at bedtime if needed  . [DISCONTINUED] apixaban (ELIQUIS) 2.5 MG TABS tablet Take 2.5 mg 2 (two) times daily by mouth.  . nitroGLYCERIN (NITROSTAT) 0.4 MG SL tablet Place 1 tablet (0.4 mg total) under the tongue every 5 (five) minutes as needed for chest pain. (Patient not taking: Reported on 07/12/2017)  . [DISCONTINUED] apixaban (ELIQUIS) 2.5 MG TABS tablet Take 1  tablet (2.5 mg total) 2 (two) times daily by mouth.   No facility-administered encounter medications on file as of 07/12/2017.       BP 132/69 (BP Location: Left Arm)   Pulse 81   Temp (!) 97 F (36.1 C) (Oral)   Ht 5\' 11"  (1.803 m)   Wt 158 lb (71.7 kg)   BMI 22.04 kg/m  Right leg: There is swelling and bruising distal to the knee.  There is tenderness in the popliteal fossa.  He is able to flex and extend the foot normally.  Assessment: Findings are most consistent with possible muscle tear, such as plantaris.  I have  tried to explain this situation. I have suggested walking as tolerated and application of some local heat as with a heating pad.  If the situation worsens, recommend follow-up with his orthopedist in [place]. I think it is unlikely that this represents DVT [place] of the fact that he is taking Eliquis.  Also there is no erythema or fever in the knee or leg distal.

## 2017-07-18 ENCOUNTER — Encounter (HOSPITAL_COMMUNITY): Payer: Self-pay | Admitting: Emergency Medicine

## 2017-07-18 ENCOUNTER — Other Ambulatory Visit: Payer: Self-pay

## 2017-07-18 ENCOUNTER — Emergency Department (HOSPITAL_COMMUNITY): Payer: Medicare PPO

## 2017-07-18 ENCOUNTER — Encounter: Payer: Self-pay | Admitting: *Deleted

## 2017-07-18 ENCOUNTER — Telehealth: Payer: Self-pay | Admitting: Family Medicine

## 2017-07-18 ENCOUNTER — Emergency Department (HOSPITAL_COMMUNITY)
Admission: EM | Admit: 2017-07-18 | Discharge: 2017-07-18 | Disposition: A | Discharge status: Home / Self Care | Payer: Medicare PPO | Attending: Emergency Medicine | Admitting: Emergency Medicine

## 2017-07-18 DIAGNOSIS — Z79899 Other long term (current) drug therapy: Secondary | ICD-10-CM | POA: Insufficient documentation

## 2017-07-18 DIAGNOSIS — Z8546 Personal history of malignant neoplasm of prostate: Secondary | ICD-10-CM | POA: Insufficient documentation

## 2017-07-18 DIAGNOSIS — Z951 Presence of aortocoronary bypass graft: Secondary | ICD-10-CM | POA: Insufficient documentation

## 2017-07-18 DIAGNOSIS — N183 Chronic kidney disease, stage 3 (moderate): Secondary | ICD-10-CM | POA: Insufficient documentation

## 2017-07-18 DIAGNOSIS — Y999 Unspecified external cause status: Secondary | ICD-10-CM | POA: Diagnosis not present

## 2017-07-18 DIAGNOSIS — Z87891 Personal history of nicotine dependence: Secondary | ICD-10-CM | POA: Diagnosis not present

## 2017-07-18 DIAGNOSIS — X58XXXA Exposure to other specified factors, initial encounter: Secondary | ICD-10-CM | POA: Insufficient documentation

## 2017-07-18 DIAGNOSIS — I2510 Atherosclerotic heart disease of native coronary artery without angina pectoris: Secondary | ICD-10-CM | POA: Diagnosis not present

## 2017-07-18 DIAGNOSIS — Y929 Unspecified place or not applicable: Secondary | ICD-10-CM | POA: Diagnosis not present

## 2017-07-18 DIAGNOSIS — S8011XA Contusion of right lower leg, initial encounter: Secondary | ICD-10-CM | POA: Insufficient documentation

## 2017-07-18 DIAGNOSIS — Y939 Activity, unspecified: Secondary | ICD-10-CM | POA: Insufficient documentation

## 2017-07-18 DIAGNOSIS — I129 Hypertensive chronic kidney disease with stage 1 through stage 4 chronic kidney disease, or unspecified chronic kidney disease: Secondary | ICD-10-CM | POA: Insufficient documentation

## 2017-07-18 DIAGNOSIS — E039 Hypothyroidism, unspecified: Secondary | ICD-10-CM | POA: Diagnosis not present

## 2017-07-18 DIAGNOSIS — Z7902 Long term (current) use of antithrombotics/antiplatelets: Secondary | ICD-10-CM | POA: Insufficient documentation

## 2017-07-18 DIAGNOSIS — M79604 Pain in right leg: Secondary | ICD-10-CM | POA: Diagnosis present

## 2017-07-18 DIAGNOSIS — Z96653 Presence of artificial knee joint, bilateral: Secondary | ICD-10-CM | POA: Diagnosis not present

## 2017-07-18 LAB — CBC WITH DIFFERENTIAL/PLATELET
Basophils Absolute: 0 10*3/uL (ref 0.0–0.1)
Basophils Relative: 0 %
Eosinophils Absolute: 0.1 10*3/uL (ref 0.0–0.7)
Eosinophils Relative: 1 %
HEMATOCRIT: 38.2 % — AB (ref 39.0–52.0)
HEMOGLOBIN: 12.1 g/dL — AB (ref 13.0–17.0)
LYMPHS ABS: 0.7 10*3/uL (ref 0.7–4.0)
LYMPHS PCT: 8 %
MCH: 31.4 pg (ref 26.0–34.0)
MCHC: 31.7 g/dL (ref 30.0–36.0)
MCV: 99.2 fL (ref 78.0–100.0)
MONOS PCT: 9 %
Monocytes Absolute: 0.8 10*3/uL (ref 0.1–1.0)
NEUTROS PCT: 82 %
Neutro Abs: 7 10*3/uL (ref 1.7–7.7)
Platelets: 305 10*3/uL (ref 150–400)
RBC: 3.85 MIL/uL — AB (ref 4.22–5.81)
RDW: 13.3 % (ref 11.5–15.5)
WBC: 8.6 10*3/uL (ref 4.0–10.5)

## 2017-07-18 LAB — BASIC METABOLIC PANEL
Anion gap: 9 (ref 5–15)
BUN: 22 mg/dL — AB (ref 6–20)
CHLORIDE: 101 mmol/L (ref 101–111)
CO2: 26 mmol/L (ref 22–32)
CREATININE: 1.36 mg/dL — AB (ref 0.61–1.24)
Calcium: 10 mg/dL (ref 8.9–10.3)
GFR calc Af Amer: 52 mL/min — ABNORMAL LOW (ref >60)
GFR calc non Af Amer: 45 mL/min — ABNORMAL LOW (ref >60)
GLUCOSE: 130 mg/dL — AB (ref 65–99)
POTASSIUM: 3.7 mmol/L (ref 3.5–5.1)
Sodium: 136 mmol/L (ref 135–145)

## 2017-07-18 LAB — PROTIME-INR
INR: 1.06
PROTHROMBIN TIME: 13.7 s (ref 11.4–15.2)

## 2017-07-18 NOTE — Discharge Instructions (Signed)
The ultrasound did not show any blood clot in your vein.  It does show what looks like a hematoma in the muscle.  This may have been caused by muscle tear or pull.  Follow-up with your primary care doctor or consider seeing an orthopedic doctor.  Try to keep your leg elevated when you can.  Swelling will likely take several weeks to go away.  Return for fever worsening

## 2017-07-18 NOTE — Telephone Encounter (Signed)
Left message for patient to call to schedule for continued problems.  No available appointments for months with Dr. Laurance Flatten.

## 2017-07-18 NOTE — ED Notes (Signed)
Pt back from ultrasound.

## 2017-07-18 NOTE — Progress Notes (Unsigned)
Patient came into triage concerned about his right lower leg. He was here one week ago for the same thing. I evaluated the leg and read last weeks office visit notes. Right lower leg is warm to touch behind knee and calf area, slight redness noted. We currently have no appointments until tomorrow available. In order to rule out a DVT, pt was instructed to go to [facility] ED.

## 2017-07-18 NOTE — ED Triage Notes (Signed)
Pain and swelling to lower lt leg x 2weeks

## 2017-07-18 NOTE — ED Notes (Signed)
Pt ambulated to bathroom without an issue

## 2017-07-18 NOTE — ED Provider Notes (Signed)
[facility] EMERGENCY DEPARTMENT Provider Note   CSN: [id number] Arrival date & time: 07/18/17  1102     History   Chief Complaint Chief Complaint  Patient presents with  . Leg Pain    HPI [name] is a 83 y.o. male.  HPI Patient presents to the emergency room for evaluation of right leg pain and swelling.  Patient states his symptoms started at least a week ago.  He is noticed swelling and cramping in his right leg below his knee.  He initially saw his primary doctor who thought he could have a muscle pull.  Patient thinks it may have all started off as a muscle pull.  His symptoms have not gotten any better.  He went back to the office today but was not able to be seen.  It sounded like they suggested he come to the emergency room for further evaluation.  He denies any trouble with any chest pain.  He states he has some chronic issues with his breathing but nothing acute.  Denies any fevers or chills.  No trauma. Past Medical History:  Diagnosis Date  . Arthritis   . Atrial fibrillation and flutter ([place])   . Choledocholithiasis   . CKD (chronic kidney disease) 08/2013   stage 3  . Coronary artery disease    CABG x 4 09/03/13 with LIMA to LAD, SVG to diag, SVG sequential to OM 1 and OM2  . Dysrhythmia    atrial fib  . GERD (gastroesophageal reflux disease)    Large HH with intrathoracic component.   . H/O hiatal hernia   . Hyperlipemia   . Hypertension   . Hypothyroidism   . Pneumonia ~ 1935  . Prostate cancer [facility])     Patient Active Problem List   Diagnosis Date Noted  . Kidney disease, chronic, stage III (GFR 30-59 ml/min) ([place]) 04/11/2017  . Essential hypertension 04/11/2017  . S/P left inguinal hernia repair 10/02/2016  . Neuropathy 05/25/2016  . Aortic atherosclerosis ([place]) 05/25/2016  . Primary osteoarthritis of left knee 09/20/2015  . Anemia, chronic renal failure 04/15/2015  . Edema 04/12/2015  . Hyperbilirubinemia   . Choledocholithiasis 04/06/2015    . Hypoxia   . Abdominal pain, epigastric   . Abnormal liver enzymes   . Abnormal LFTs   . Biliary sepsis   . Acute respiratory failure ([place])   . Severe sepsis ([place])   . Bacteremia due to Gram-negative bacteria   . Hiatal hernia   . Sepsis ([place]) 03/28/2015  . Incisional hernia, without obstruction or gangrene 08/17/2014  . Atrial flutter ([place]) 07/28/2014  . Palpitation 06/16/2014  . Right knee DJD 03/11/2014  . Hx of CABG 09/03/2013  . Insomnia 07/27/2013  . Testosterone deficiency 07/27/2013  . Other hyperlipidemia 06/10/2013  . Heart murmur 06/10/2013  . Dyspnea 06/10/2013  . Inguinal hernia, left. 04/04/2012  . Paraesophageal hiatal hernia 03/24/2012  . Prostate cancer ([place]) 01/15/2012  . Special screening for malignant neoplasms, colon 01/15/2012    Past Surgical History:  Procedure Laterality Date  . CATARACT EXTRACTION Bilateral   . COLONOSCOPY  2003, 01/2012   non-bleeding cecal AVMs, descending and sigmoid tics.   . CORONARY ARTERY BYPASS GRAFT N/A 09/03/2013   Procedure: CORONARY ARTERY BYPASS GRAFTING times four using left internal mammary and bilateral saphenous vein.;  Surgeon: Ivin Poot, MD;  Location: [place];  Service: Open Heart Surgery;  Laterality: N/A;  . ERCP N/A 04/04/2015   Procedure: ENDOSCOPIC RETROGRADE CHOLANGIOPANCREATOGRAPHY (ERCP);  Surgeon: Inda Castle, MD;  Location: [place];  Service: Endoscopy;  Laterality: N/A;  . ERCP N/A 04/06/2015   Procedure: ENDOSCOPIC RETROGRADE CHOLANGIOPANCREATOGRAPHY (ERCP);  Surgeon: Inda Castle, MD;  Location: [facility];  Service: Endoscopy;  Laterality: N/A;  . ESOPHAGOGASTRODUODENOSCOPY  01/2012   11 cm sliding hiatal hernia.  [facility] EYE SURGERY     cataracts removed, ? IOL  . HERNIA REPAIR     abdominal?  . INGUINAL HERNIA REPAIR Left 10/02/2016  . INGUINAL HERNIA REPAIR Left 10/02/2016   Procedure: REPAIR LEFT INGUINAL HERNIA WITH MESH;  Surgeon: Georganna Skeans, MD;  Location: [place];  Service:  General;  Laterality: Left;  . INSERTION OF MESH Left 10/02/2016   Procedure: INSERTION OF MESH;  Surgeon: Georganna Skeans, MD;  Location: [place];  Service: General;  Laterality: Left;  . INTRAOPERATIVE TRANSESOPHAGEAL ECHOCARDIOGRAM N/A 09/03/2013   Procedure: INTRAOPERATIVE TRANSESOPHAGEAL ECHOCARDIOGRAM;  Surgeon: Ivin Poot, MD;  Location: [place];  Service: Open Heart Surgery;  Laterality: N/A;  . LEFT HEART CATHETERIZATION WITH CORONARY ANGIOGRAM N/A 08/03/2013   Procedure: LEFT HEART CATHETERIZATION WITH CORONARY ANGIOGRAM;  Surgeon: Blane Ohara, MD;  Location: [facility] CATH LAB;  Service: Cardiovascular;  Laterality: N/A;  . PROSTATE SURGERY    . THORACOTOMY Right As a young child, ? 79 years old   for pneumonia   . TOTAL KNEE ARTHROPLASTY Right 03/11/2014   Procedure: TOTAL KNEE ARTHROPLASTY;  Surgeon: Hessie Dibble, MD;  Location: [place];  Service: Orthopedics;  Laterality: Right;  . TOTAL KNEE ARTHROPLASTY Left 09/20/2015   Procedure: TOTAL KNEE ARTHROPLASTY;  Surgeon: Melrose Nakayama, MD;  Location: [place];  Service: Orthopedics;  Laterality: Left;       Home Medications    Prior to Admission medications   Medication Sig Start Date End Date Taking? Authorizing Provider  acetaminophen (TYLENOL) 325 MG tablet Take 325-650 mg by mouth every 6 (six) hours as needed (for pain.).    [provider]  Ca Carbonate-Mag Hydroxide (ROLAIDS PO) Take 1-2 tablets by mouth 3 (three) times daily as needed (for heartburn/indigestion).    [provider]  Cholecalciferol (VITAMIN D3) 1000 UNITS CAPS Take 1,000 Units by mouth every morning.     [provider]  diltiazem (CARDIZEM CD) 240 MG 24 hr capsule Take 240 mg daily by mouth.    [provider]  ELIQUIS 2.5 MG TABS tablet TAKE 1 TABLET TWICE DAILY 06/14/17   Chipper Herb, MD  furosemide (LASIX) 40 MG tablet Take 40 mg by mouth.    [provider]  levothyroxine (SYNTHROID, LEVOTHROID) 25 MCG  tablet Take 25 mcg daily before breakfast by mouth.    [provider]  nitroGLYCERIN (NITROSTAT) 0.4 MG SL tablet Place 1 tablet (0.4 mg total) under the tongue every 5 (five) minutes as needed for chest pain. Patient not taking: Reported on 07/12/2017 06/06/16 09/20/17  Minus Breeding, MD  omeprazole (PRILOSEC) 40 MG capsule Take 40 mg daily by mouth.    [provider]  oxybutynin (DITROPAN) 5 MG tablet Take 5 mg daily by mouth.    [provider]  oxymetazoline (AFRIN) 0.05 % nasal spray Place 1 spray into both nostrils 2 (two) times daily as needed for congestion.    [provider]  pramipexole (MIRAPEX) 1 MG tablet Take 1 mg by mouth at bedtime.    [provider]  pravastatin (PRAVACHOL) 40 MG tablet Take 40 mg daily by mouth.    [provider]  traMADol (ULTRAM) 50 MG tablet Take 1 tablet (50 mg total) by mouth every 6 (six) hours as needed for moderate pain. 10/03/16   Georganna Skeans, MD  traZODone (DESYREL) 100 MG tablet 100 mg. Take 1/2 tablet by mouth at bedtime if needed    [provider]    Family History Family History  Problem Relation Age of Onset  . Cancer Mother        bone marrow  . Cancer Father   . Cancer Brother        skin  . Healthy Son   . Colon cancer Neg Hx     Social History Social History   Tobacco Use  . Smoking status: Former Smoker    Packs/day: 1.00    Types: Cigarettes    Last attempt to quit: 08/20/1978    Years since quitting: 38.9  . Smokeless tobacco: Never Used  Substance Use Topics  . Alcohol use: No  . Drug use: No     Allergies   Lipitor [atorvastatin]; Doxycycline; and Penicillins   Review of Systems Review of Systems  All other systems reviewed and are negative.    Physical Exam Updated Vital Signs BP (!) 186/65 (BP Location: Left Arm)   Pulse 68   Temp 98.2 F (36.8 C) (Oral)   Resp 15   Ht 1.803 m (5\' 11" )   Wt 71.7 kg (158 lb)   SpO2 97%   BMI  22.04 kg/m   Physical Exam  Constitutional: He appears well-developed and well-nourished. No distress.  HENT:  Head: Normocephalic and atraumatic.  Right Ear: External ear normal.  Left Ear: External ear normal.  Eyes: Conjunctivae are normal. Right eye exhibits no discharge. Left eye exhibits no discharge. No scleral icterus.  Neck: Neck supple. No tracheal deviation present.  Cardiovascular: Normal rate, regular rhythm and intact distal pulses.  Pulmonary/Chest: Effort normal and breath sounds normal. No stridor. No respiratory distress. He has no wheezes. He has no rales.  Abdominal: Soft. Bowel sounds are normal. He exhibits no distension. There is no tenderness. There is no rebound and no guarding.  Musculoskeletal: He exhibits edema and tenderness.  Edema below the right knee of the calf and right foot, mild erythema, tenderness to palpation of the calf, no lymphangitic streaking  Neurological: He is alert. He has normal strength. No cranial nerve deficit (no facial droop, extraocular movements intact, no slurred speech) or sensory deficit. He exhibits normal muscle tone. He displays no seizure activity. Coordination normal.  Skin: Skin is warm and dry. No rash noted.  Psychiatric: He has a normal mood and affect.  Nursing note and vitals reviewed.    ED Treatments / Results  Labs (all labs ordered are listed, but only abnormal results are displayed) Labs Reviewed  CBC WITH DIFFERENTIAL/PLATELET - Abnormal; Notable for the following components:      Result Value   RBC 3.85 (*)    Hemoglobin 12.1 (*)    HCT 38.2 (*)    All other components within normal limits  BASIC METABOLIC PANEL - Abnormal; Notable for the following components:   Glucose, Bld 130 (*)    BUN 22 (*)    Creatinine, Ser 1.36 (*)    GFR calc non Af Amer 45 (*)    GFR calc Af Amer 52 (*)    All other components within normal limits  PROTIME-INR     Radiology US Venous Img Lower Unilateral  Right  Result Date: 07/18/2017 CLINICAL  DATA:  83 year old male with a history of right leg swelling and possible DVT EXAM: RIGHT LOWER EXTREMITY VENOUS DOPPLER ULTRASOUND TECHNIQUE: Gray-scale sonography with graded compression, as well as color Doppler and duplex ultrasound were performed to evaluate the lower extremity deep venous systems from the level of the common femoral vein and including the common femoral, femoral, profunda femoral, popliteal and calf veins including the posterior tibial, peroneal and gastrocnemius veins when visible. The superficial great saphenous vein was also interrogated. Spectral Doppler was utilized to evaluate flow at rest and with distal augmentation maneuvers in the common femoral, femoral and popliteal veins. COMPARISON:  None. FINDINGS: Contralateral Common Femoral Vein: Respiratory phasicity is normal and symmetric with the symptomatic side. No evidence of thrombus. Normal compressibility. Common Femoral Vein: No evidence of thrombus. Normal compressibility, respiratory phasicity and response to augmentation. Saphenofemoral Junction: No evidence of thrombus. Normal compressibility and flow on color Doppler imaging. Profunda Femoral Vein: No evidence of thrombus. Normal compressibility and flow on color Doppler imaging. Femoral Vein: No evidence of thrombus. Normal compressibility, respiratory phasicity and response to augmentation. Popliteal Vein: No evidence of thrombus. Normal compressibility, respiratory phasicity and response to augmentation. Calf Veins: No evidence of thrombus. Normal compressibility and flow on color Doppler imaging. Superficial Great Saphenous Vein: No evidence of thrombus. Normal compressibility and flow on color Doppler imaging. Other Findings: Complex cystic lesion of the right calf measures 2.9 cm x 4.1 cm x 13 cm IMPRESSION: Sonographic survey of the right lower extremity negative for DVT. Complex and partially cystic fluid collection of the  right posterior calf in the region of swelling measures as large as 13 cm. Differential includes involving hematoma, seroma, infection. Electronically Signed   By: Corrie Mckusick D.O.   On: 07/18/2017 12:57    Procedures Procedures (including critical care time)  Medications Ordered in ED Medications - No data to display   Initial Impression / Assessment and Plan / ED Course  I have reviewed the triage vital signs and the nursing notes.  Pertinent labs & imaging results that were available during my care of the patient were reviewed by me and considered in my medical decision making (see chart for details).   Patient presented to the emergency room for evaluation of right leg swelling.  His laboratory tests are unremarkable.  His hemoglobin is slightly decreased and his creatinine is slightly increased but I do not think this is clinically significant.  Ultrasound does not show a DVT.  It does demonstrate a large fluid collection in his right calf.  I suspect this is a hematoma/seroma.  I doubt infection.  Plan to have him follow-up with his primary doctor and consider seeing an orthopedic.  Try to keep the leg elevated Final Clinical Impressions(s) / ED Diagnoses   Final diagnoses:  Hematoma of leg, right, initial encounter    ED Discharge Orders    None       Dorie Rank, MD 07/18/17 1334

## 2017-07-18 NOTE — Progress Notes (Signed)
He has already been seen there and according to the note he has a hematoma.  If we need to do a follow-up we can certainly do that.

## 2017-08-08 ENCOUNTER — Other Ambulatory Visit: Payer: Self-pay | Admitting: Family Medicine

## 2017-08-30 ENCOUNTER — Encounter: Payer: Self-pay | Admitting: Family Medicine

## 2017-08-30 ENCOUNTER — Ambulatory Visit (INDEPENDENT_AMBULATORY_CARE_PROVIDER_SITE_OTHER): Payer: Medicare HMO | Admitting: Family Medicine

## 2017-08-30 VITALS — BP 126/74 | HR 85 | Temp 97.0°F | Ht 71.0 in | Wt 159.0 lb

## 2017-08-30 DIAGNOSIS — I4892 Unspecified atrial flutter: Secondary | ICD-10-CM | POA: Diagnosis not present

## 2017-08-30 DIAGNOSIS — G2581 Restless legs syndrome: Secondary | ICD-10-CM | POA: Diagnosis not present

## 2017-08-30 DIAGNOSIS — E7849 Other hyperlipidemia: Secondary | ICD-10-CM

## 2017-08-30 DIAGNOSIS — K5901 Slow transit constipation: Secondary | ICD-10-CM | POA: Diagnosis not present

## 2017-08-30 DIAGNOSIS — C61 Malignant neoplasm of prostate: Secondary | ICD-10-CM

## 2017-08-30 DIAGNOSIS — I10 Essential (primary) hypertension: Secondary | ICD-10-CM

## 2017-08-30 DIAGNOSIS — E559 Vitamin D deficiency, unspecified: Secondary | ICD-10-CM | POA: Diagnosis not present

## 2017-08-30 DIAGNOSIS — N183 Chronic kidney disease, stage 3 unspecified: Secondary | ICD-10-CM

## 2017-08-30 DIAGNOSIS — I700 Atherosclerosis of aorta: Secondary | ICD-10-CM

## 2017-08-30 DIAGNOSIS — R32 Unspecified urinary incontinence: Secondary | ICD-10-CM

## 2017-08-30 MED ORDER — OMEPRAZOLE 40 MG PO CPDR: 40.0000 mg | DELAYED_RELEASE_CAPSULE | Freq: Every day | ORAL | 3 refills | Status: AC

## 2017-08-30 MED ORDER — OXYMETAZOLINE HCL 0.05 % NA SOLN: 1.0000 | Freq: Two times a day (BID) | NASAL | 3 refills | Status: DC | PRN

## 2017-08-30 MED ORDER — DILTIAZEM HCL ER COATED BEADS 240 MG PO CP24: 240.0000 mg | ORAL_CAPSULE | Freq: Every day | ORAL | 3 refills | Status: AC

## 2017-08-30 MED ORDER — TRAZODONE HCL 100 MG PO TABS: ORAL_TABLET | ORAL | 3 refills | Status: DC

## 2017-08-30 MED ORDER — FESOTERODINE FUMARATE ER 4 MG PO TB24: 4.0000 mg | ORAL_TABLET | Freq: Every day | ORAL | 3 refills | Status: AC

## 2017-08-30 MED ORDER — FUROSEMIDE 40 MG PO TABS: 40.0000 mg | ORAL_TABLET | Freq: Every day | ORAL | 3 refills | Status: DC

## 2017-08-30 MED ORDER — PRAMIPEXOLE DIHYDROCHLORIDE 1 MG PO TABS: 1.5000 mg | ORAL_TABLET | Freq: Every day | ORAL | 2 refills | Status: AC

## 2017-08-30 MED ORDER — LEVOTHYROXINE SODIUM 25 MCG PO TABS: 25.0000 ug | ORAL_TABLET | Freq: Every day | ORAL | 3 refills | Status: AC

## 2017-08-30 MED ORDER — PRAVASTATIN SODIUM 40 MG PO TABS: 40.0000 mg | ORAL_TABLET | Freq: Every day | ORAL | 3 refills | Status: AC

## 2017-08-30 MED ORDER — PRAMIPEXOLE DIHYDROCHLORIDE 1 MG PO TABS: 1.0000 mg | ORAL_TABLET | Freq: Every day | ORAL | 3 refills | Status: DC

## 2017-08-30 NOTE — Addendum Note (Signed)
Addended by: Zannie Cove on: 08/30/2017 12:44 PM   Modules accepted: Orders

## 2017-08-30 NOTE — Patient Instructions (Addendum)
Medicare Annual Wellness Visit  [facility] and the medical providers at [facility] strive to bring you the best medical care.  In doing so we not only want to address your current medical conditions and concerns but also to detect new conditions early and prevent illness, disease and health-related problems.    Medicare offers a yearly Wellness Visit which allows our clinical staff to assess your need for preventative services including immunizations, lifestyle education, counseling to decrease risk of preventable diseases and screening for fall risk and other medical concerns.    This visit is provided free of charge (no copay) for all Medicare recipients. The clinical pharmacists at [facility] have begun to conduct these Wellness Visits which will also include a thorough review of all your medications.    As you primary medical provider recommend that you make an appointment for your Annual Wellness Visit if you have not done so already this year.  You may set up this appointment before you leave today or you may call back ([phone]) and schedule an appointment.  Please make sure when you call that you mention that you are scheduling your Annual Wellness Visit with the clinical pharmacist so that the appointment may be made for the proper length of time.     Continue current medications. Continue good therapeutic lifestyle changes which include good diet and exercise. Fall precautions discussed with patient. If an FOBT was given today- please return it to our front desk. If you are over 54 years old - you may need Prevnar 73 or the adult Pneumonia vaccine.  **Flu shots are available--- please call and schedule a FLU-CLINIC appointment**  After your visit with [place] today you will receive a survey in the mail or online from [employer] regarding your care with [place]. Please take a moment to fill this out. Your feedback is very  important to [place] as you can help [place] better understand your patient needs as well as improve your experience and satisfaction. WE CARE ABOUT YOU!!!   For constipation, try Colace 100 mg 1-2 capsules daily and continue to drink plenty of water and fluids Continue to follow-up with urology and cardiology as planned We will call with lab work results as soon as these results become available Discuss the dose of Mirapex after your renal function is returned

## 2017-08-30 NOTE — Progress Notes (Signed)
Subjective:    Patient ID: [name], male    DOB: 18-Jul-1927, 84 y.o.   MRN: [id number]  HPI Pt here for follow up and management of chronic medical problems which includes hypertension and hyperlipidemia. He is taking medication regularly.  The patient is complaining of a bruised and sensitive area on the back of his right calf which is getting better.  He will get lab work today and is requesting refills on all of his medicines.  He was seen in the emergency room on 1129 with a diagnosis of a hematoma.  This patient has a history of prostate cancer.  He is also on thyroid replacement and takes Eliquis for atrial arrhythmia.  The patient denies any chest pain or shortness of breath and does see the cardiologist regularly toward the end of the year each year.  He denies any trouble with nausea vomiting diarrhea or blood in the stool but does complain of some constipation and says he is tried MiraLAX and a stool softener that was sent to him by his insurance company.  He says nothing is working as well as it should.  He does drink plenty of water.  The Lisbeth Ply that the urologist has given him has helped control his urinary frequency and he sees the urologist regularly.     Patient Active Problem List   Diagnosis Date Noted  . Kidney disease, chronic, stage III (GFR 30-59 ml/min) ([place]) 04/11/2017  . Essential hypertension 04/11/2017  . S/P left inguinal hernia repair 10/02/2016  . Neuropathy 05/25/2016  . Aortic atherosclerosis ([place]) 05/25/2016  . Primary osteoarthritis of left knee 09/20/2015  . Anemia, chronic renal failure 04/15/2015  . Edema 04/12/2015  . Hyperbilirubinemia   . Choledocholithiasis 04/06/2015  . Hypoxia   . Abdominal pain, epigastric   . Abnormal liver enzymes   . Abnormal LFTs   . Biliary sepsis   . Acute respiratory failure ([place])   . Severe sepsis ([place])   . Bacteremia due to Gram-negative bacteria   . Hiatal hernia   . Sepsis ([place]) 03/28/2015  . Incisional  hernia, without obstruction or gangrene 08/17/2014  . Atrial flutter ([place]) 07/28/2014  . Palpitation 06/16/2014  . Right knee DJD 03/11/2014  . Hx of CABG 09/03/2013  . Insomnia 07/27/2013  . Testosterone deficiency 07/27/2013  . Other hyperlipidemia 06/10/2013  . Heart murmur 06/10/2013  . Dyspnea 06/10/2013  . Inguinal hernia, left. 04/04/2012  . Paraesophageal hiatal hernia 03/24/2012  . Prostate cancer ([place]) 01/15/2012  . Special screening for malignant neoplasms, colon 01/15/2012   Outpatient Encounter Medications as of 08/30/2017  Medication Sig  . acetaminophen (TYLENOL) 325 MG tablet Take 325-650 mg by mouth every 6 (six) hours as needed (for pain.).  . Ca Carbonate-Mag Hydroxide (ROLAIDS PO) Take 1-2 tablets by mouth 3 (three) times daily as needed (for heartburn/indigestion).  . Cholecalciferol (VITAMIN D3) 1000 UNITS CAPS Take 1,000 Units by mouth every morning.   . diltiazem (CARDIZEM CD) 240 MG 24 hr capsule Take 240 mg daily by mouth.  . ELIQUIS 2.5 MG TABS tablet TAKE 1 TABLET TWICE DAILY  . fesoterodine (TOVIAZ) 4 MG TB24 tablet Take 4 mg by mouth daily.  . furosemide (LASIX) 40 MG tablet Take 40 mg by mouth.  . levothyroxine (SYNTHROID, LEVOTHROID) 25 MCG tablet Take 25 mcg daily before breakfast by mouth.  . nitroGLYCERIN (NITROSTAT) 0.4 MG SL tablet Place 1 tablet (0.4 mg total) under the tongue every 5 (five) minutes as needed for chest  pain.  . omeprazole (PRILOSEC) 40 MG capsule TAKE 1 CAPSULE EVERY DAY  . oxymetazoline (AFRIN) 0.05 % nasal spray Place 1 spray into both nostrils 2 (two) times daily as needed for congestion.  . pramipexole (MIRAPEX) 1 MG tablet Take 1 mg by mouth at bedtime.  . pravastatin (PRAVACHOL) 40 MG tablet Take 40 mg daily by mouth.  . traMADol (ULTRAM) 50 MG tablet Take 1 tablet (50 mg total) by mouth every 6 (six) hours as needed for moderate pain.  . traZODone (DESYREL) 100 MG tablet 100 mg. Take 1/2 tablet by mouth at bedtime if needed    . [DISCONTINUED] oxybutynin (DITROPAN) 5 MG tablet Take 5 mg daily by mouth.   No facility-administered encounter medications on file as of 08/30/2017.       Review of Systems  Constitutional: Negative.   HENT: Negative.   Eyes: Negative.   Respiratory: Negative.   Cardiovascular: Negative.   Gastrointestinal: Negative.   Endocrine: Negative.   Genitourinary: Negative.   Musculoskeletal: Negative.        Recent swelling/red/ bruising - of right calf - has scans and is some better.   Skin: Negative.   Allergic/Immunologic: Negative.   Neurological: Negative.   Hematological: Negative.   Psychiatric/Behavioral: Negative.        Objective:   Physical Exam  Constitutional: He is oriented to person, place, and time. He appears well-developed and well-nourished. No distress.  The patient is calm pleasant and relaxed.  HENT:  Head: Normocephalic and atraumatic.  Right Ear: External ear normal.  Left Ear: External ear normal.  Nose: Nose normal.  Mouth/Throat: Oropharynx is clear and moist. No oropharyngeal exudate.  Eyes: Conjunctivae and EOM are normal. Pupils are equal, round, and reactive to light. Right eye exhibits no discharge. Left eye exhibits no discharge. No scleral icterus.  Neck: Normal range of motion. Neck supple. No thyromegaly present.  Cardiovascular: Normal rate, regular rhythm, normal heart sounds and intact distal pulses.  No murmur heard. The heart is regular at 72/min  Pulmonary/Chest: Effort normal and breath sounds normal. No respiratory distress. He has no wheezes. He has no rales. He exhibits no tenderness.  Clear anteriorly and posteriorly and no axillary adenopathy  Abdominal: Soft. Bowel sounds are normal. He exhibits no mass. There is no tenderness. There is no rebound and no guarding.  No abdominal tenderness masses organ enlargement or bruits  Genitourinary:  Genitourinary Comments: The patient sees the urologist regularly and has recently  started him on [place].  Musculoskeletal: Normal range of motion. He exhibits no edema.  Lymphadenopathy:    He has no cervical adenopathy.  Neurological: He is alert and oriented to person, place, and time. He has normal reflexes. No cranial nerve deficit.  Skin: Skin is warm and dry. No rash noted.  Psychiatric: He has a normal mood and affect. His behavior is normal. Judgment and thought content normal.  Nursing note and vitals reviewed.  BP 126/74 (BP Location: Left Arm)   Pulse 85   Temp (!) 97 F (36.1 C) (Oral)   Ht '5\' 11"'  (1.803 m)   Wt 159 lb (72.1 kg)   BMI 22.18 kg/m         Assessment & Plan:  1. Other hyperlipidemia -Continue follow-up with cardiology continue with current statin therapy and his aggressive therapeutic lifestyle changes as possible which include diet and exercise pending results of lab work - Lipid panel - CBC with Differential/Platelet  2. Essential hypertension -The blood pressure is  good today and he will continue with current treatment - BMP8+EGFR - CBC with Differential/Platelet - Hepatic function panel  3. Vitamin D deficiency -Continue with vitamin D replacement pending results of lab work - CBC with Differential/Platelet - VITAMIN D 25 Hydroxy (Vit-D Deficiency, Fractures)  4. Prostate cancer ([place]) -Continue with follow-up with urology as planned - CBC with Differential/Platelet  5. Kidney disease, chronic, stage III (GFR 30-59 ml/min) ([facility]) - BMP8+EGFR - CBC with Differential/Platelet -The patient's last creatinine in November was 1.36.  6. Aortic atherosclerosis ([place]) -Continue with aggressive therapeutic lifestyle changes and pravastatin.  Pending results of lab work.  7. Atrial flutter, unspecified type ([place]) -Heart was regular today and he will continue with his blood thinner and caffeine avoidance and follow-up visits with cardiology  8. Slow transit constipation -Try Colace 100 mg 1-2 capsules at bedtime.  9. Urinary  incontinence, unspecified type -Continue follow-up with urology  10. Restless leg syndrome -We will look at increasing the dose of the Mirapex pending results of lab work and renal function.  Meds ordered this encounter  Medications  . traZODone (DESYREL) 100 MG tablet    Sig: Take 1/2 tablet by mouth at bedtime if needed    Dispense:  45 tablet    Refill:  3  . pravastatin (PRAVACHOL) 40 MG tablet    Sig: Take 1 tablet (40 mg total) by mouth daily.    Dispense:  90 tablet    Refill:  3  . pramipexole (MIRAPEX) 1 MG tablet    Sig: Take 1 tablet (1 mg total) by mouth at bedtime.    Dispense:  90 tablet    Refill:  3  . oxymetazoline (AFRIN) 0.05 % nasal spray    Sig: Place 1 spray into both nostrils 2 (two) times daily as needed for congestion.    Dispense:  30 mL    Refill:  3  . omeprazole (PRILOSEC) 40 MG capsule    Sig: Take 1 capsule (40 mg total) by mouth daily.    Dispense:  90 capsule    Refill:  3  . levothyroxine (SYNTHROID, LEVOTHROID) 25 MCG tablet    Sig: Take 1 tablet (25 mcg total) by mouth daily before breakfast.    Dispense:  90 tablet    Refill:  3  . furosemide (LASIX) 40 MG tablet    Sig: Take 1 tablet (40 mg total) by mouth daily.    Dispense:  90 tablet    Refill:  3  . diltiazem (CARDIZEM CD) 240 MG 24 hr capsule    Sig: Take 1 capsule (240 mg total) by mouth daily.    Dispense:  90 capsule    Refill:  3   Patient Instructions                       Medicare Annual Wellness Visit  [facility] and the medical providers at [facility] strive to bring you the best medical care.  In doing so we not only want to address your current medical conditions and concerns but also to detect new conditions early and prevent illness, disease and health-related problems.    Medicare offers a yearly Wellness Visit which allows our clinical staff to assess your need for preventative services including immunizations, lifestyle education,  counseling to decrease risk of preventable diseases and screening for fall risk and other medical concerns.    This visit is provided free of charge (no copay) for all  Medicare recipients. The clinical pharmacists at [facility] have begun to conduct these Wellness Visits which will also include a thorough review of all your medications.    As you primary medical provider recommend that you make an appointment for your Annual Wellness Visit if you have not done so already this year.  You may set up this appointment before you leave today or you may call back ([phone]) and schedule an appointment.  Please make sure when you call that you mention that you are scheduling your Annual Wellness Visit with the clinical pharmacist so that the appointment may be made for the proper length of time.     Continue current medications. Continue good therapeutic lifestyle changes which include good diet and exercise. Fall precautions discussed with patient. If an FOBT was given today- please return it to our front desk. If you are over 89 years old - you may need Prevnar 12 or the adult Pneumonia vaccine.  **Flu shots are available--- please call and schedule a FLU-CLINIC appointment**  After your visit with [place] today you will receive a survey in the mail or online from [employer] regarding your care with [place]. Please take a moment to fill this out. Your feedback is very important to [place] as you can help [place] better understand your patient needs as well as improve your experience and satisfaction. WE CARE ABOUT YOU!!!   For constipation, try Colace 100 mg 1-2 capsules daily and continue to drink plenty of water and fluids Continue to follow-up with urology and cardiology as planned We will call with lab work results as soon as these results become available Discuss the dose of Mirapex after your renal function is returned   Arrie Senate MD

## 2017-08-31 LAB — CBC WITH DIFFERENTIAL/PLATELET
BASOS ABS: 0 10*3/uL (ref 0.0–0.2)
Basos: 0 %
EOS (ABSOLUTE): 0.2 10*3/uL (ref 0.0–0.4)
Eos: 2 %
Hematocrit: 40.3 % (ref 37.5–51.0)
Hemoglobin: 12.9 g/dL — ABNORMAL LOW (ref 13.0–17.7)
IMMATURE GRANS (ABS): 0 10*3/uL (ref 0.0–0.1)
IMMATURE GRANULOCYTES: 0 %
LYMPHS: 12 %
Lymphocytes Absolute: 0.8 10*3/uL (ref 0.7–3.1)
MCH: 31 pg (ref 26.6–33.0)
MCHC: 32 g/dL (ref 31.5–35.7)
MCV: 97 fL (ref 79–97)
MONOS ABS: 0.7 10*3/uL (ref 0.1–0.9)
Monocytes: 10 %
NEUTROS PCT: 76 %
Neutrophils Absolute: 5.1 10*3/uL (ref 1.4–7.0)
PLATELETS: 263 10*3/uL (ref 150–379)
RBC: 4.16 x10E6/uL (ref 4.14–5.80)
RDW: 13.5 % (ref 12.3–15.4)
WBC: 6.8 10*3/uL (ref 3.4–10.8)

## 2017-08-31 LAB — BMP8+EGFR
BUN / CREAT RATIO: 12 (ref 10–24)
BUN: 16 mg/dL (ref 8–27)
CO2: 25 mmol/L (ref 20–29)
CREATININE: 1.29 mg/dL — AB (ref 0.76–1.27)
Calcium: 10 mg/dL (ref 8.6–10.2)
Chloride: 101 mmol/L (ref 96–106)
GFR calc non Af Amer: 49 mL/min/{1.73_m2} — ABNORMAL LOW (ref >59)
GFR, EST AFRICAN AMERICAN: 57 mL/min/{1.73_m2} — AB (ref >59)
GLUCOSE: 87 mg/dL (ref 65–99)
Potassium: 4.3 mmol/L (ref 3.5–5.2)
Sodium: 143 mmol/L (ref 134–144)

## 2017-08-31 LAB — VITAMIN D 25 HYDROXY (VIT D DEFICIENCY, FRACTURES): VIT D 25 HYDROXY: 31.4 ng/mL (ref 30.0–100.0)

## 2017-08-31 LAB — HEPATIC FUNCTION PANEL
ALBUMIN: 4.3 g/dL (ref 3.5–4.7)
ALT: 16 IU/L (ref 0–44)
AST: 23 IU/L (ref 0–40)
Alkaline Phosphatase: 122 IU/L — ABNORMAL HIGH (ref 39–117)
BILIRUBIN TOTAL: 0.4 mg/dL (ref 0.0–1.2)
Bilirubin, Direct: 0.14 mg/dL (ref 0.00–0.40)
Total Protein: 6.9 g/dL (ref 6.0–8.5)

## 2017-08-31 LAB — LIPID PANEL
CHOLESTEROL TOTAL: 128 mg/dL (ref 100–199)
Chol/HDL Ratio: 2.7 ratio (ref 0.0–5.0)
HDL: 48 mg/dL (ref >39)
LDL CALC: 59 mg/dL (ref 0–99)
Triglycerides: 105 mg/dL (ref 0–149)
VLDL Cholesterol Cal: 21 mg/dL (ref 5–40)

## 2017-09-06 DIAGNOSIS — Z8546 Personal history of malignant neoplasm of prostate: Secondary | ICD-10-CM | POA: Diagnosis not present

## 2017-09-06 DIAGNOSIS — K219 Gastro-esophageal reflux disease without esophagitis: Secondary | ICD-10-CM | POA: Diagnosis not present

## 2017-09-06 DIAGNOSIS — I4892 Unspecified atrial flutter: Secondary | ICD-10-CM | POA: Diagnosis not present

## 2017-09-06 DIAGNOSIS — Z951 Presence of aortocoronary bypass graft: Secondary | ICD-10-CM | POA: Diagnosis not present

## 2017-09-06 DIAGNOSIS — E559 Vitamin D deficiency, unspecified: Secondary | ICD-10-CM | POA: Diagnosis not present

## 2017-09-06 DIAGNOSIS — I129 Hypertensive chronic kidney disease with stage 1 through stage 4 chronic kidney disease, or unspecified chronic kidney disease: Secondary | ICD-10-CM | POA: Diagnosis not present

## 2017-09-06 DIAGNOSIS — E039 Hypothyroidism, unspecified: Secondary | ICD-10-CM | POA: Diagnosis not present

## 2017-09-06 DIAGNOSIS — G4700 Insomnia, unspecified: Secondary | ICD-10-CM | POA: Diagnosis not present

## 2017-09-06 DIAGNOSIS — E785 Hyperlipidemia, unspecified: Secondary | ICD-10-CM | POA: Diagnosis not present

## 2017-09-06 DIAGNOSIS — I2510 Atherosclerotic heart disease of native coronary artery without angina pectoris: Secondary | ICD-10-CM | POA: Diagnosis not present

## 2017-09-06 DIAGNOSIS — G2581 Restless legs syndrome: Secondary | ICD-10-CM | POA: Diagnosis not present

## 2017-10-02 DIAGNOSIS — K4091 Unilateral inguinal hernia, without obstruction or gangrene, recurrent: Secondary | ICD-10-CM | POA: Diagnosis not present

## 2017-10-28 DIAGNOSIS — Z08 Encounter for follow-up examination after completed treatment for malignant neoplasm: Secondary | ICD-10-CM | POA: Diagnosis not present

## 2017-10-28 DIAGNOSIS — Z85828 Personal history of other malignant neoplasm of skin: Secondary | ICD-10-CM | POA: Diagnosis not present

## 2017-10-28 DIAGNOSIS — X32XXXD Exposure to sunlight, subsequent encounter: Secondary | ICD-10-CM | POA: Diagnosis not present

## 2017-10-28 DIAGNOSIS — L570 Actinic keratosis: Secondary | ICD-10-CM | POA: Diagnosis not present

## 2017-11-01 ENCOUNTER — Other Ambulatory Visit: Payer: Self-pay | Admitting: Family Medicine

## 2017-12-11 DIAGNOSIS — K4091 Unilateral inguinal hernia, without obstruction or gangrene, recurrent: Secondary | ICD-10-CM | POA: Diagnosis not present

## 2018-01-01 ENCOUNTER — Other Ambulatory Visit: Payer: Self-pay | Admitting: *Deleted

## 2018-01-01 MED ORDER — TRAZODONE HCL 100 MG PO TABS: ORAL_TABLET | ORAL | 2 refills | Status: AC

## 2018-01-02 ENCOUNTER — Ambulatory Visit (INDEPENDENT_AMBULATORY_CARE_PROVIDER_SITE_OTHER): Payer: Medicare HMO | Admitting: Family Medicine

## 2018-01-02 ENCOUNTER — Encounter: Payer: Self-pay | Admitting: Family Medicine

## 2018-01-02 VITALS — BP 156/64 | HR 60 | Temp 96.8°F | Ht 71.0 in | Wt 148.0 lb

## 2018-01-02 DIAGNOSIS — E559 Vitamin D deficiency, unspecified: Secondary | ICD-10-CM | POA: Diagnosis not present

## 2018-01-02 DIAGNOSIS — N3942 Incontinence without sensory awareness: Secondary | ICD-10-CM | POA: Diagnosis not present

## 2018-01-02 DIAGNOSIS — K4090 Unilateral inguinal hernia, without obstruction or gangrene, not specified as recurrent: Secondary | ICD-10-CM | POA: Diagnosis not present

## 2018-01-02 DIAGNOSIS — N183 Chronic kidney disease, stage 3 unspecified: Secondary | ICD-10-CM

## 2018-01-02 DIAGNOSIS — K5901 Slow transit constipation: Secondary | ICD-10-CM

## 2018-01-02 DIAGNOSIS — I700 Atherosclerosis of aorta: Secondary | ICD-10-CM

## 2018-01-02 DIAGNOSIS — I10 Essential (primary) hypertension: Secondary | ICD-10-CM

## 2018-01-02 DIAGNOSIS — D692 Other nonthrombocytopenic purpura: Secondary | ICD-10-CM | POA: Diagnosis not present

## 2018-01-02 DIAGNOSIS — C61 Malignant neoplasm of prostate: Secondary | ICD-10-CM

## 2018-01-02 DIAGNOSIS — E7849 Other hyperlipidemia: Secondary | ICD-10-CM | POA: Diagnosis not present

## 2018-01-02 LAB — URINALYSIS, COMPLETE
BILIRUBIN UA: NEGATIVE
GLUCOSE, UA: NEGATIVE
KETONES UA: NEGATIVE
Leukocytes, UA: NEGATIVE
Nitrite, UA: NEGATIVE
PROTEIN UA: NEGATIVE
RBC UA: NEGATIVE
SPEC GRAV UA: 1.01 (ref 1.005–1.030)
Urobilinogen, Ur: 0.2 mg/dL (ref 0.2–1.0)
pH, UA: 7 (ref 5.0–7.5)

## 2018-01-02 LAB — MICROSCOPIC EXAMINATION
Bacteria, UA: NONE SEEN
Epithelial Cells (non renal): NONE SEEN /hpf (ref 0–10)
RBC, UA: NONE SEEN /hpf (ref 0–2)
Renal Epithel, UA: NONE SEEN /hpf
WBC, UA: NONE SEEN /hpf (ref 0–5)

## 2018-01-02 NOTE — Patient Instructions (Addendum)
Medicare Annual Wellness Visit  [facility] and the medical providers at [facility] strive to bring you the best medical care.  In doing so we not only want to address your current medical conditions and concerns but also to detect new conditions early and prevent illness, disease and health-related problems.    Medicare offers a yearly Wellness Visit which allows our clinical staff to assess your need for preventative services including immunizations, lifestyle education, counseling to decrease risk of preventable diseases and screening for fall risk and other medical concerns.    This visit is provided free of charge (no copay) for all Medicare recipients. The clinical pharmacists at [facility] have begun to conduct these Wellness Visits which will also include a thorough review of all your medications.    As you primary medical provider recommend that you make an appointment for your Annual Wellness Visit if you have not done so already this year.  You may set up this appointment before you leave today or you may call back ([phone]) and schedule an appointment.  Please make sure when you call that you mention that you are scheduling your Annual Wellness Visit with the clinical pharmacist so that the appointment may be made for the proper length of time.     Continue current medications. Continue good therapeutic lifestyle changes which include good diet and exercise. Fall precautions discussed with patient. If an FOBT was given today- please return it to our front desk. If you are over 45 years old - you may need Prevnar 2 or the adult Pneumonia vaccine.  **Flu shots are available--- please call and schedule a FLU-CLINIC appointment**  After your visit with [place] today you will receive a survey in the mail or online from [employer] regarding your care with [place]. Please take a moment to fill this out. Your feedback is very  important to [place] as you can help [place] better understand your patient needs as well as improve your experience and satisfaction. WE CARE ABOUT YOU!!!   I would recommend that the patient go back and talk to the surgeon if he so desires regarding the left inguinal hernia that is persisting.  In my opinion as long as you are not having any more trouble with it than you are currently having I would leave well enough alone and I think that is what the surgeon may be also thinking. The patient has a history of a prostatectomy.  He is having urinary incontinence and has tried Myrbetriq and does not like that.  I am also thinking that the urologist would prefer that he use a pad in his underwear and avoid any further surgery which could result in complications. The patient should continue to follow-up with the cardiologist The patient should drink plenty of fluids and stay well-hydrated

## 2018-01-02 NOTE — Progress Notes (Signed)
Subjective:    Patient ID: [name], male    DOB: Feb 12, 1927, 84 y.o.   MRN: [id number]  HPI Pt here for follow up and management of chronic medical problems which includes hypertension and hyperlipidemia. He is taking medication regularly.  The patient has had ongoing problems with a recurring hernia.  He is due to get a rectal exam today and lab work today as well as a urinalysis.  He is 84 years old and his vital signs are stable except his systolic blood pressure is elevated at 156/64.  This patient is 84 years old but he asked and thinks like he is 84 years old which is good but also not good because he wants to feel better than may be we can make him feel and we have to be careful and not pushed him to have things done that can make him worse.  He is concerned because he had a hernia repair but still has the hernia as it has recurred.  He has had 3 visits to the surgeon and does not have another one scheduled.  The patient denies any chest pain or shortness of breath.  He denies any trouble with his stomach including nausea vomiting diarrhea or blood in the stool other than some constipation.  He is uncomfortable with this hernia and notices this especially after he has had activity.  He had prostate cancer about 16 years ago and did have a prostatectomy and still does not have good control with his voiding and has tried Myrbetriq but does not like that.  I am not sure if there are any other options short of surgery to fix this and we will most likely schedule him to follow-up with Dr. Diona Fanti for any further suggestions.     Patient Active Problem List   Diagnosis Date Noted  . Kidney disease, chronic, stage III (GFR 30-59 ml/min) ([place]) 04/11/2017  . Essential hypertension 04/11/2017  . S/P left inguinal hernia repair 10/02/2016  . Neuropathy 05/25/2016  . Aortic atherosclerosis ([place]) 05/25/2016  . Primary osteoarthritis of left knee 09/20/2015  . Anemia, chronic renal failure  04/15/2015  . Edema 04/12/2015  . Hyperbilirubinemia   . Choledocholithiasis 04/06/2015  . Hypoxia   . Abdominal pain, epigastric   . Abnormal liver enzymes   . Abnormal LFTs   . Biliary sepsis   . Acute respiratory failure ([place])   . Severe sepsis ([place])   . Bacteremia due to Gram-negative bacteria   . Hiatal hernia   . Sepsis ([place]) 03/28/2015  . Incisional hernia, without obstruction or gangrene 08/17/2014  . Atrial flutter ([place]) 07/28/2014  . Palpitation 06/16/2014  . Right knee DJD 03/11/2014  . Hx of CABG 09/03/2013  . Insomnia 07/27/2013  . Testosterone deficiency 07/27/2013  . Other hyperlipidemia 06/10/2013  . Heart murmur 06/10/2013  . Dyspnea 06/10/2013  . Inguinal hernia, left. 04/04/2012  . Paraesophageal hiatal hernia 03/24/2012  . Prostate cancer ([place]) 01/15/2012  . Special screening for malignant neoplasms, colon 01/15/2012   Outpatient Encounter Medications as of 01/02/2018  Medication Sig  . acetaminophen (TYLENOL) 325 MG tablet Take 325-650 mg by mouth every 6 (six) hours as needed (for pain.).  . Ca Carbonate-Mag Hydroxide (ROLAIDS PO) Take 1-2 tablets by mouth 3 (three) times daily as needed (for heartburn/indigestion).  . Cholecalciferol (VITAMIN D3) 1000 UNITS CAPS Take 1,000 Units by mouth every morning.   . diltiazem (CARDIZEM CD) 240 MG 24 hr capsule Take 1 capsule (240 mg total)  by mouth daily.  Marland Kitchen ELIQUIS 2.5 MG TABS tablet TAKE 1 TABLET TWICE DAILY  . fesoterodine (TOVIAZ) 4 MG TB24 tablet Take 1 tablet (4 mg total) by mouth daily.  . furosemide (LASIX) 40 MG tablet Take 1 tablet (40 mg total) by mouth daily.  Marland Kitchen levothyroxine (SYNTHROID, LEVOTHROID) 25 MCG tablet Take 1 tablet (25 mcg total) by mouth daily before breakfast.  . omeprazole (PRILOSEC) 40 MG capsule Take 1 capsule (40 mg total) by mouth daily.  Marland Kitchen oxymetazoline (AFRIN) 0.05 % nasal spray Place 1 spray into both nostrils 2 (two) times daily as needed for congestion.  . pramipexole (MIRAPEX)  1 MG tablet Take 1.5 tablets (1.5 mg total) by mouth at bedtime.  . pravastatin (PRAVACHOL) 40 MG tablet Take 1 tablet (40 mg total) by mouth daily.  . traMADol (ULTRAM) 50 MG tablet Take 1 tablet (50 mg total) by mouth every 6 (six) hours as needed for moderate pain.  . traZODone (DESYREL) 100 MG tablet Take 1/2 tablet by mouth at bedtime if needed  . nitroGLYCERIN (NITROSTAT) 0.4 MG SL tablet Place 1 tablet (0.4 mg total) under the tongue every 5 (five) minutes as needed for chest pain.   No facility-administered encounter medications on file as of 01/02/2018.      Review of Systems  Constitutional: Negative.   HENT: Negative.   Eyes: Negative.   Respiratory: Negative.   Cardiovascular: Negative.   Gastrointestinal: Negative.   Endocrine: Negative.   Genitourinary: Negative.   Musculoskeletal: Positive for arthralgias (right hip pain ).  Skin: Negative.   Allergic/Immunologic: Negative.   Neurological: Negative.   Hematological: Negative.   Psychiatric/Behavioral: Negative.        Objective:   Physical Exam  Constitutional: He is oriented to person, place, and time. He appears well-developed and well-nourished. No distress.  The patient is pleasant and alert and would like to have a physical body that is younger than one that is close to 84 years of age.  HENT:  Head: Normocephalic and atraumatic.  Left Ear: External ear normal.  Nose: Nose normal.  Mouth/Throat: Oropharynx is clear and moist. No oropharyngeal exudate.  Well-hydrated and ear cerumen left ear canal which the patient says he can irrigate at home.  Eyes: Pupils are equal, round, and reactive to light. Conjunctivae and EOM are normal. Right eye exhibits no discharge. Left eye exhibits no discharge.  Neck: Normal range of motion. Neck supple. No thyromegaly present.  No bruits thyromegaly or anterior cervical adenopathy  Cardiovascular: Normal rate, regular rhythm and intact distal pulses.  Murmur heard. The  heart has a grade 3/6 systolic ejection murmur with a regular rate and rhythm at 72/min  Pulmonary/Chest: Effort normal and breath sounds normal. He has no wheezes. He has no rales. He exhibits no tenderness.  Clear anteriorly and posteriorly no axillary adenopathy  Abdominal: Soft. Bowel sounds are normal. He exhibits no mass. There is no tenderness. There is no guarding.  No abdominal tenderness masses organ enlargement bruits or inguinal adenopathy  Genitourinary: Rectum normal and penis normal.  Genitourinary Comments: The prostate is absent.  There were no masses per rectum.  There were no inguinal hernias but there is a left indirect hernia noted.  This is most prominent with standing.  The patient does have urinary incontinence.  Musculoskeletal: Normal range of motion. He exhibits no edema.  Bilateral knee replacements with good movement activity.  Lymphadenopathy:    He has no cervical adenopathy.  Neurological: He is alert  and oriented to person, place, and time. He has normal reflexes.  Skin: Skin is warm and dry. No rash noted.  Senile purpura both arms  Psychiatric: He has a normal mood and affect. His behavior is normal. Judgment and thought content normal.  Nursing note and vitals reviewed.   BP (!) 156/64 (BP Location: Left Arm)   Pulse 60   Temp (!) 96.8 F (36 C) (Oral)   Ht '5\' 11"'  (1.803 m)   Wt 148 lb (67.1 kg)   BMI 20.64 kg/m        Assessment & Plan:  1. Essential hypertension -Systolic blood pressure is slightly elevated today with a good diastolic reading at 64.  No change in treatment. - BMP8+EGFR - CBC with Differential/Platelet - Hepatic function panel  2. Other hyperlipidemia -Continue with current treatment and with as aggressive therapeutic lifestyle changes as possible pending results of lab work - CBC with Differential/Platelet - Lipid panel  3. Vitamin D deficiency -10 you with vitamin D replacement pending results of lab work - CBC with  Differential/Platelet - VITAMIN D 25 Hydroxy (Vit-D Deficiency, Fractures)  4. Prostate cancer ([place]) -Continue to follow-up with Dr. Diona Fanti - Urinalysis, Complete - CBC with Differential/Platelet  5. Kidney disease, chronic, stage III (GFR 30-59 ml/min) ([facility]) -Avoid NSAIDs and only take Tylenol - BMP8+EGFR - CBC with Differential/Platelet  6. Aortic atherosclerosis ([place]) -Continue aggressive therapeutic lifestyle changes in addition to regular follow-ups with the cardiologist and his pravastatin. - CBC with Differential/Platelet - Lipid panel  7. Left inguinal hernia -Follow-up with surgeon as discussed in visit today.  If you are not having that much discomfort with the presence of the hernia you may want to just watch this and not do anything aggressive about it unless the pain becomes more severe.  8. Urinary incontinence without sensory awareness -Follow-up with urology  9. Senile purpura ([facility]) -Bruises noted on arms  10. Slow transit constipation -Continue to drink plenty of fluids and stay well-hydrated  Patient Instructions                       Medicare Annual Wellness Visit  [facility] and the medical providers at [facility] strive to bring you the best medical care.  In doing so we not only want to address your current medical conditions and concerns but also to detect new conditions early and prevent illness, disease and health-related problems.    Medicare offers a yearly Wellness Visit which allows our clinical staff to assess your need for preventative services including immunizations, lifestyle education, counseling to decrease risk of preventable diseases and screening for fall risk and other medical concerns.    This visit is provided free of charge (no copay) for all Medicare recipients. The clinical pharmacists at [facility] have begun to conduct these Wellness Visits which will also include a thorough review  of all your medications.    As you primary medical provider recommend that you make an appointment for your Annual Wellness Visit if you have not done so already this year.  You may set up this appointment before you leave today or you may call back ([phone]) and schedule an appointment.  Please make sure when you call that you mention that you are scheduling your Annual Wellness Visit with the clinical pharmacist so that the appointment may be made for the proper length of time.     Continue current medications. Continue good therapeutic lifestyle  changes which include good diet and exercise. Fall precautions discussed with patient. If an FOBT was given today- please return it to our front desk. If you are over 59 years old - you may need Prevnar 45 or the adult Pneumonia vaccine.  **Flu shots are available--- please call and schedule a FLU-CLINIC appointment**  After your visit with [place] today you will receive a survey in the mail or online from [employer] regarding your care with [place]. Please take a moment to fill this out. Your feedback is very important to [place] as you can help [place] better understand your patient needs as well as improve your experience and satisfaction. WE CARE ABOUT YOU!!!   I would recommend that the patient go back and talk to the surgeon if he so desires regarding the left inguinal hernia that is persisting.  In my opinion as long as you are not having any more trouble with it than you are currently having I would leave well enough alone and I think that is what the surgeon may be also thinking. The patient has a history of a prostatectomy.  He is having urinary incontinence and has tried Myrbetriq and does not like that.  I am also thinking that the urologist would prefer that he use a pad in his underwear and avoid any further surgery which could result in complications. The patient should continue to follow-up with the cardiologist The patient should drink plenty of fluids  and stay well-hydrated  Arrie Senate MD

## 2018-01-03 LAB — LIPID PANEL
CHOL/HDL RATIO: 2.7 ratio (ref 0.0–5.0)
CHOLESTEROL TOTAL: 144 mg/dL (ref 100–199)
HDL: 54 mg/dL (ref >39)
LDL CALC: 66 mg/dL (ref 0–99)
Triglycerides: 122 mg/dL (ref 0–149)
VLDL CHOLESTEROL CAL: 24 mg/dL (ref 5–40)

## 2018-01-03 LAB — CBC WITH DIFFERENTIAL/PLATELET
BASOS: 0 %
Basophils Absolute: 0 10*3/uL (ref 0.0–0.2)
EOS (ABSOLUTE): 0.2 10*3/uL (ref 0.0–0.4)
Eos: 3 %
HEMATOCRIT: 39.5 % (ref 37.5–51.0)
HEMOGLOBIN: 13.2 g/dL (ref 13.0–17.7)
IMMATURE GRANS (ABS): 0 10*3/uL (ref 0.0–0.1)
Immature Granulocytes: 0 %
LYMPHS ABS: 0.8 10*3/uL (ref 0.7–3.1)
LYMPHS: 11 %
MCH: 31.4 pg (ref 26.6–33.0)
MCHC: 33.4 g/dL (ref 31.5–35.7)
MCV: 94 fL (ref 79–97)
Monocytes Absolute: 0.6 10*3/uL (ref 0.1–0.9)
Monocytes: 9 %
NEUTROS ABS: 5.8 10*3/uL (ref 1.4–7.0)
Neutrophils: 77 %
Platelets: 281 10*3/uL (ref 150–379)
RBC: 4.21 x10E6/uL (ref 4.14–5.80)
RDW: 13.8 % (ref 12.3–15.4)
WBC: 7.4 10*3/uL (ref 3.4–10.8)

## 2018-01-03 LAB — BMP8+EGFR
BUN / CREAT RATIO: 12 (ref 10–24)
BUN: 16 mg/dL (ref 8–27)
CALCIUM: 10.5 mg/dL — AB (ref 8.6–10.2)
CHLORIDE: 99 mmol/L (ref 96–106)
CO2: 26 mmol/L (ref 20–29)
Creatinine, Ser: 1.31 mg/dL — ABNORMAL HIGH (ref 0.76–1.27)
GFR, EST AFRICAN AMERICAN: 56 mL/min/{1.73_m2} — AB (ref >59)
GFR, EST NON AFRICAN AMERICAN: 48 mL/min/{1.73_m2} — AB (ref >59)
Glucose: 99 mg/dL (ref 65–99)
Potassium: 3.8 mmol/L (ref 3.5–5.2)
SODIUM: 140 mmol/L (ref 134–144)

## 2018-01-03 LAB — HEPATIC FUNCTION PANEL
ALBUMIN: 4.5 g/dL (ref 3.5–4.7)
ALK PHOS: 125 IU/L — AB (ref 39–117)
ALT: 14 IU/L (ref 0–44)
AST: 21 IU/L (ref 0–40)
BILIRUBIN TOTAL: 0.6 mg/dL (ref 0.0–1.2)
Bilirubin, Direct: 0.17 mg/dL (ref 0.00–0.40)
Total Protein: 7.1 g/dL (ref 6.0–8.5)

## 2018-01-03 LAB — VITAMIN D 25 HYDROXY (VIT D DEFICIENCY, FRACTURES): Vit D, 25-Hydroxy: 47.4 ng/mL (ref 30.0–100.0)

## 2018-01-12 ENCOUNTER — Observation Stay (HOSPITAL_BASED_OUTPATIENT_CLINIC_OR_DEPARTMENT_OTHER)
Admission: EM | Admit: 2018-01-12 | Discharge: 2018-01-13 | Disposition: A | Discharge status: Home / Self Care | Payer: Medicare HMO | Source: Home / Self Care | Attending: Emergency Medicine | Admitting: Emergency Medicine

## 2018-01-12 ENCOUNTER — Encounter (HOSPITAL_COMMUNITY): Payer: Self-pay

## 2018-01-12 ENCOUNTER — Emergency Department (HOSPITAL_COMMUNITY): Payer: Medicare HMO

## 2018-01-12 ENCOUNTER — Other Ambulatory Visit: Payer: Self-pay

## 2018-01-12 DIAGNOSIS — I4892 Unspecified atrial flutter: Secondary | ICD-10-CM

## 2018-01-12 DIAGNOSIS — R1032 Left lower quadrant pain: Secondary | ICD-10-CM | POA: Insufficient documentation

## 2018-01-12 DIAGNOSIS — E039 Hypothyroidism, unspecified: Secondary | ICD-10-CM | POA: Insufficient documentation

## 2018-01-12 DIAGNOSIS — Z681 Body mass index (BMI) 19 or less, adult: Secondary | ICD-10-CM | POA: Diagnosis not present

## 2018-01-12 DIAGNOSIS — K210 Gastro-esophageal reflux disease with esophagitis: Secondary | ICD-10-CM | POA: Diagnosis not present

## 2018-01-12 DIAGNOSIS — R0789 Other chest pain: Secondary | ICD-10-CM | POA: Diagnosis not present

## 2018-01-12 DIAGNOSIS — R7989 Other specified abnormal findings of blood chemistry: Secondary | ICD-10-CM

## 2018-01-12 DIAGNOSIS — K209 Esophagitis, unspecified: Secondary | ICD-10-CM | POA: Diagnosis not present

## 2018-01-12 DIAGNOSIS — J9691 Respiratory failure, unspecified with hypoxia: Secondary | ICD-10-CM | POA: Diagnosis not present

## 2018-01-12 DIAGNOSIS — Z515 Encounter for palliative care: Secondary | ICD-10-CM | POA: Diagnosis not present

## 2018-01-12 DIAGNOSIS — I700 Atherosclerosis of aorta: Secondary | ICD-10-CM | POA: Diagnosis not present

## 2018-01-12 DIAGNOSIS — Z452 Encounter for adjustment and management of vascular access device: Secondary | ICD-10-CM | POA: Diagnosis not present

## 2018-01-12 DIAGNOSIS — E785 Hyperlipidemia, unspecified: Secondary | ICD-10-CM | POA: Diagnosis not present

## 2018-01-12 DIAGNOSIS — Z951 Presence of aortocoronary bypass graft: Secondary | ICD-10-CM | POA: Insufficient documentation

## 2018-01-12 DIAGNOSIS — N189 Chronic kidney disease, unspecified: Secondary | ICD-10-CM | POA: Diagnosis not present

## 2018-01-12 DIAGNOSIS — R935 Abnormal findings on diagnostic imaging of other abdominal regions, including retroperitoneum: Secondary | ICD-10-CM | POA: Diagnosis not present

## 2018-01-12 DIAGNOSIS — I5030 Unspecified diastolic (congestive) heart failure: Secondary | ICD-10-CM | POA: Diagnosis not present

## 2018-01-12 DIAGNOSIS — K219 Gastro-esophageal reflux disease without esophagitis: Secondary | ICD-10-CM | POA: Diagnosis not present

## 2018-01-12 DIAGNOSIS — D631 Anemia in chronic kidney disease: Secondary | ICD-10-CM | POA: Diagnosis not present

## 2018-01-12 DIAGNOSIS — Z96653 Presence of artificial knee joint, bilateral: Secondary | ICD-10-CM | POA: Insufficient documentation

## 2018-01-12 DIAGNOSIS — N183 Chronic kidney disease, stage 3 unspecified: Secondary | ICD-10-CM | POA: Diagnosis present

## 2018-01-12 DIAGNOSIS — Z8719 Personal history of other diseases of the digestive system: Secondary | ICD-10-CM | POA: Insufficient documentation

## 2018-01-12 DIAGNOSIS — Z87891 Personal history of nicotine dependence: Secondary | ICD-10-CM

## 2018-01-12 DIAGNOSIS — K920 Hematemesis: Secondary | ICD-10-CM | POA: Diagnosis present

## 2018-01-12 DIAGNOSIS — Z9889 Other specified postprocedural states: Secondary | ICD-10-CM | POA: Diagnosis not present

## 2018-01-12 DIAGNOSIS — J80 Acute respiratory distress syndrome: Secondary | ICD-10-CM | POA: Diagnosis not present

## 2018-01-12 DIAGNOSIS — Z9911 Dependence on respirator [ventilator] status: Secondary | ICD-10-CM | POA: Diagnosis not present

## 2018-01-12 DIAGNOSIS — I10 Essential (primary) hypertension: Secondary | ICD-10-CM | POA: Diagnosis not present

## 2018-01-12 DIAGNOSIS — R197 Diarrhea, unspecified: Secondary | ICD-10-CM | POA: Diagnosis not present

## 2018-01-12 DIAGNOSIS — K449 Diaphragmatic hernia without obstruction or gangrene: Secondary | ICD-10-CM | POA: Diagnosis not present

## 2018-01-12 DIAGNOSIS — I4891 Unspecified atrial fibrillation: Secondary | ICD-10-CM

## 2018-01-12 DIAGNOSIS — D692 Other nonthrombocytopenic purpura: Secondary | ICD-10-CM

## 2018-01-12 DIAGNOSIS — I2510 Atherosclerotic heart disease of native coronary artery without angina pectoris: Secondary | ICD-10-CM | POA: Diagnosis present

## 2018-01-12 DIAGNOSIS — J9602 Acute respiratory failure with hypercapnia: Secondary | ICD-10-CM | POA: Diagnosis not present

## 2018-01-12 DIAGNOSIS — E870 Hyperosmolality and hypernatremia: Secondary | ICD-10-CM | POA: Diagnosis not present

## 2018-01-12 DIAGNOSIS — Z66 Do not resuscitate: Secondary | ICD-10-CM | POA: Diagnosis not present

## 2018-01-12 DIAGNOSIS — R079 Chest pain, unspecified: Secondary | ICD-10-CM | POA: Diagnosis not present

## 2018-01-12 DIAGNOSIS — K3189 Other diseases of stomach and duodenum: Secondary | ICD-10-CM | POA: Diagnosis not present

## 2018-01-12 DIAGNOSIS — G629 Polyneuropathy, unspecified: Secondary | ICD-10-CM | POA: Diagnosis not present

## 2018-01-12 DIAGNOSIS — E876 Hypokalemia: Secondary | ICD-10-CM

## 2018-01-12 DIAGNOSIS — R748 Abnormal levels of other serum enzymes: Secondary | ICD-10-CM | POA: Diagnosis not present

## 2018-01-12 DIAGNOSIS — J9600 Acute respiratory failure, unspecified whether with hypoxia or hypercapnia: Secondary | ICD-10-CM | POA: Diagnosis not present

## 2018-01-12 DIAGNOSIS — Z79899 Other long term (current) drug therapy: Secondary | ICD-10-CM

## 2018-01-12 DIAGNOSIS — I129 Hypertensive chronic kidney disease with stage 1 through stage 4 chronic kidney disease, or unspecified chronic kidney disease: Secondary | ICD-10-CM | POA: Insufficient documentation

## 2018-01-12 DIAGNOSIS — D62 Acute posthemorrhagic anemia: Secondary | ICD-10-CM | POA: Diagnosis present

## 2018-01-12 DIAGNOSIS — J810 Acute pulmonary edema: Secondary | ICD-10-CM | POA: Diagnosis not present

## 2018-01-12 DIAGNOSIS — J690 Pneumonitis due to inhalation of food and vomit: Secondary | ICD-10-CM | POA: Diagnosis present

## 2018-01-12 DIAGNOSIS — R918 Other nonspecific abnormal finding of lung field: Secondary | ICD-10-CM | POA: Diagnosis not present

## 2018-01-12 DIAGNOSIS — J9690 Respiratory failure, unspecified, unspecified whether with hypoxia or hypercapnia: Secondary | ICD-10-CM | POA: Diagnosis not present

## 2018-01-12 DIAGNOSIS — R1013 Epigastric pain: Secondary | ICD-10-CM | POA: Insufficient documentation

## 2018-01-12 DIAGNOSIS — R778 Other specified abnormalities of plasma proteins: Secondary | ICD-10-CM

## 2018-01-12 DIAGNOSIS — I509 Heart failure, unspecified: Secondary | ICD-10-CM | POA: Diagnosis not present

## 2018-01-12 DIAGNOSIS — G4700 Insomnia, unspecified: Secondary | ICD-10-CM | POA: Diagnosis not present

## 2018-01-12 DIAGNOSIS — K311 Adult hypertrophic pyloric stenosis: Secondary | ICD-10-CM | POA: Diagnosis present

## 2018-01-12 DIAGNOSIS — I248 Other forms of acute ischemic heart disease: Secondary | ICD-10-CM | POA: Diagnosis not present

## 2018-01-12 DIAGNOSIS — J9601 Acute respiratory failure with hypoxia: Secondary | ICD-10-CM | POA: Diagnosis not present

## 2018-01-12 DIAGNOSIS — K2901 Acute gastritis with bleeding: Secondary | ICD-10-CM | POA: Diagnosis not present

## 2018-01-12 DIAGNOSIS — I130 Hypertensive heart and chronic kidney disease with heart failure and stage 1 through stage 4 chronic kidney disease, or unspecified chronic kidney disease: Secondary | ICD-10-CM | POA: Diagnosis present

## 2018-01-12 DIAGNOSIS — R0902 Hypoxemia: Secondary | ICD-10-CM | POA: Diagnosis not present

## 2018-01-12 DIAGNOSIS — Z4682 Encounter for fitting and adjustment of non-vascular catheter: Secondary | ICD-10-CM | POA: Diagnosis not present

## 2018-01-12 DIAGNOSIS — R6521 Severe sepsis with septic shock: Secondary | ICD-10-CM | POA: Diagnosis present

## 2018-01-12 DIAGNOSIS — K440 Diaphragmatic hernia with obstruction, without gangrene: Secondary | ICD-10-CM | POA: Diagnosis not present

## 2018-01-12 DIAGNOSIS — N170 Acute kidney failure with tubular necrosis: Secondary | ICD-10-CM | POA: Diagnosis not present

## 2018-01-12 DIAGNOSIS — A419 Sepsis, unspecified organism: Secondary | ICD-10-CM | POA: Diagnosis present

## 2018-01-12 DIAGNOSIS — N179 Acute kidney failure, unspecified: Secondary | ICD-10-CM | POA: Diagnosis not present

## 2018-01-12 DIAGNOSIS — K2951 Unspecified chronic gastritis with bleeding: Secondary | ICD-10-CM | POA: Diagnosis present

## 2018-01-12 DIAGNOSIS — R64 Cachexia: Secondary | ICD-10-CM | POA: Diagnosis present

## 2018-01-12 DIAGNOSIS — R0602 Shortness of breath: Secondary | ICD-10-CM | POA: Diagnosis not present

## 2018-01-12 DIAGNOSIS — K922 Gastrointestinal hemorrhage, unspecified: Secondary | ICD-10-CM | POA: Diagnosis not present

## 2018-01-12 HISTORY — DX: Chronic kidney disease, stage 3 (moderate): N18.3

## 2018-01-12 LAB — CBC WITH DIFFERENTIAL/PLATELET
Basophils Absolute: 0 10*3/uL (ref 0.0–0.1)
Basophils Relative: 0 %
EOS ABS: 0 10*3/uL (ref 0.0–0.7)
Eosinophils Relative: 0 %
HEMATOCRIT: 36.8 % — AB (ref 39.0–52.0)
Hemoglobin: 12.5 g/dL — ABNORMAL LOW (ref 13.0–17.0)
Lymphocytes Relative: 6 %
Lymphs Abs: 0.5 10*3/uL — ABNORMAL LOW (ref 0.7–4.0)
MCH: 32.1 pg (ref 26.0–34.0)
MCHC: 34 g/dL (ref 30.0–36.0)
MCV: 94.6 fL (ref 78.0–100.0)
MONO ABS: 0.8 10*3/uL (ref 0.1–1.0)
MONOS PCT: 10 %
Neutro Abs: 6.5 10*3/uL (ref 1.7–7.7)
Neutrophils Relative %: 84 %
Platelets: 233 10*3/uL (ref 150–400)
RBC: 3.89 MIL/uL — ABNORMAL LOW (ref 4.22–5.81)
RDW: 13.1 % (ref 11.5–15.5)
WBC: 7.7 10*3/uL (ref 4.0–10.5)

## 2018-01-12 LAB — TROPONIN I
TROPONIN I: 0.09 ng/mL — AB (ref <0.03)
TROPONIN I: 0.06 ng/mL — AB (ref <0.03)

## 2018-01-12 LAB — COMPREHENSIVE METABOLIC PANEL
ALBUMIN: 3.8 g/dL (ref 3.5–5.0)
ALK PHOS: 84 U/L (ref 38–126)
ALT: 15 U/L — ABNORMAL LOW (ref 17–63)
AST: 22 U/L (ref 15–41)
Anion gap: 11 (ref 5–15)
BILIRUBIN TOTAL: 0.9 mg/dL (ref 0.3–1.2)
BUN: 19 mg/dL (ref 6–20)
CALCIUM: 10 mg/dL (ref 8.9–10.3)
CO2: 30 mmol/L (ref 22–32)
CREATININE: 1.24 mg/dL (ref 0.61–1.24)
Chloride: 100 mmol/L — ABNORMAL LOW (ref 101–111)
GFR calc Af Amer: 58 mL/min — ABNORMAL LOW (ref >60)
GFR calc non Af Amer: 50 mL/min — ABNORMAL LOW (ref >60)
GLUCOSE: 102 mg/dL — AB (ref 65–99)
Potassium: 3.1 mmol/L — ABNORMAL LOW (ref 3.5–5.1)
Sodium: 141 mmol/L (ref 135–145)
Total Protein: 6.8 g/dL (ref 6.5–8.1)

## 2018-01-12 LAB — URINALYSIS, ROUTINE W REFLEX MICROSCOPIC
BILIRUBIN URINE: NEGATIVE
GLUCOSE, UA: NEGATIVE mg/dL
HGB URINE DIPSTICK: NEGATIVE
Ketones, ur: 5 mg/dL — AB
Leukocytes, UA: NEGATIVE
NITRITE: NEGATIVE
PROTEIN: NEGATIVE mg/dL
Specific Gravity, Urine: 1.014 (ref 1.005–1.030)
pH: 7 (ref 5.0–8.0)

## 2018-01-12 LAB — LIPASE, BLOOD: Lipase: 38 U/L (ref 11–51)

## 2018-01-12 MED ORDER — BISACODYL 10 MG RE SUPP: 10.0000 mg | Freq: Every day | RECTAL | Status: DC | PRN

## 2018-01-12 MED ORDER — FAMOTIDINE IN NACL 20-0.9 MG/50ML-% IV SOLN
20.0000 mg | Freq: Once | INTRAVENOUS | Status: AC
  Administered 2018-01-12: 20 mg via INTRAVENOUS
  Filled 2018-01-12: qty 50

## 2018-01-12 MED ORDER — TRAZODONE HCL 50 MG PO TABS
50.0000 mg | ORAL_TABLET | Freq: Every evening | ORAL | Status: DC | PRN
  Administered 2018-01-12: 50 mg via ORAL
  Filled 2018-01-12: qty 1

## 2018-01-12 MED ORDER — ONDANSETRON HCL 4 MG/2ML IJ SOLN
4.0000 mg | Freq: Once | INTRAMUSCULAR | Status: AC
  Administered 2018-01-12: 4 mg via INTRAVENOUS
  Filled 2018-01-12: qty 2

## 2018-01-12 MED ORDER — DILTIAZEM HCL ER COATED BEADS 240 MG PO CP24
240.0000 mg | ORAL_CAPSULE | Freq: Every day | ORAL | Status: DC
  Administered 2018-01-12 – 2018-01-13 (×2): 240 mg via ORAL
  Filled 2018-01-12 (×2): qty 1

## 2018-01-12 MED ORDER — SODIUM CHLORIDE 0.9 % IV SOLN
INTRAVENOUS | Status: DC
  Administered 2018-01-12: 08:00:00 via INTRAVENOUS

## 2018-01-12 MED ORDER — PRAMIPEXOLE DIHYDROCHLORIDE 1 MG PO TABS
1.5000 mg | ORAL_TABLET | Freq: Every day | ORAL | Status: DC
  Administered 2018-01-12: 1.5 mg via ORAL
  Filled 2018-01-12: qty 2

## 2018-01-12 MED ORDER — FESOTERODINE FUMARATE ER 4 MG PO TB24
4.0000 mg | ORAL_TABLET | Freq: Every day | ORAL | Status: DC
  Administered 2018-01-12 – 2018-01-13 (×2): 4 mg via ORAL
  Filled 2018-01-12 (×5): qty 1

## 2018-01-12 MED ORDER — POTASSIUM CHLORIDE CRYS ER 20 MEQ PO TBCR
60.0000 meq | EXTENDED_RELEASE_TABLET | Freq: Once | ORAL | Status: AC
  Administered 2018-01-12: 60 meq via ORAL
  Filled 2018-01-12: qty 3

## 2018-01-12 MED ORDER — GI COCKTAIL ~~LOC~~: 30.0000 mL | Freq: Four times a day (QID) | ORAL | Status: DC | PRN

## 2018-01-12 MED ORDER — CALCIUM CARBONATE ANTACID 500 MG PO CHEW: 1.0000 | CHEWABLE_TABLET | Freq: Three times a day (TID) | ORAL | Status: DC | PRN

## 2018-01-12 MED ORDER — PANTOPRAZOLE SODIUM 40 MG PO TBEC
80.0000 mg | DELAYED_RELEASE_TABLET | Freq: Every day | ORAL | Status: DC
  Administered 2018-01-12 – 2018-01-13 (×2): 80 mg via ORAL
  Filled 2018-01-12 (×2): qty 2

## 2018-01-12 MED ORDER — APIXABAN 2.5 MG PO TABS
2.5000 mg | ORAL_TABLET | Freq: Two times a day (BID) | ORAL | Status: DC
  Administered 2018-01-12 – 2018-01-13 (×3): 2.5 mg via ORAL
  Filled 2018-01-12 (×3): qty 1

## 2018-01-12 MED ORDER — LEVOTHYROXINE SODIUM 25 MCG PO TABS
25.0000 ug | ORAL_TABLET | Freq: Every day | ORAL | Status: DC
  Administered 2018-01-13: 25 ug via ORAL
  Filled 2018-01-12: qty 1

## 2018-01-12 MED ORDER — IOPAMIDOL (ISOVUE-300) INJECTION 61%
100.0000 mL | Freq: Once | INTRAVENOUS | Status: AC | PRN
  Administered 2018-01-12: 100 mL via INTRAVENOUS

## 2018-01-12 MED ORDER — NITROGLYCERIN 0.3 MG SL SUBL: 0.3000 mg | SUBLINGUAL_TABLET | SUBLINGUAL | Status: DC | PRN

## 2018-01-12 MED ORDER — CA CARBONATE-MAG HYDROXIDE 550-110 MG PO CHEW: 1.0000 | CHEWABLE_TABLET | Freq: Three times a day (TID) | ORAL | Status: DC | PRN

## 2018-01-12 MED ORDER — IPRATROPIUM-ALBUTEROL 0.5-2.5 (3) MG/3ML IN SOLN
3.0000 mL | RESPIRATORY_TRACT | Status: DC | PRN
  Filled 2018-01-12: qty 3

## 2018-01-12 MED ORDER — PRAVASTATIN SODIUM 40 MG PO TABS
40.0000 mg | ORAL_TABLET | Freq: Every day | ORAL | Status: DC
  Administered 2018-01-12: 40 mg via ORAL
  Filled 2018-01-12: qty 1

## 2018-01-12 MED ORDER — ONDANSETRON HCL 4 MG/2ML IJ SOLN
4.0000 mg | Freq: Four times a day (QID) | INTRAMUSCULAR | Status: DC | PRN
  Administered 2018-01-12: 4 mg via INTRAVENOUS
  Filled 2018-01-12: qty 2

## 2018-01-12 MED ORDER — FENTANYL CITRATE (PF) 100 MCG/2ML IJ SOLN
50.0000 ug | Freq: Once | INTRAMUSCULAR | Status: AC
  Administered 2018-01-12: 50 ug via INTRAVENOUS
  Filled 2018-01-12: qty 2

## 2018-01-12 MED ORDER — ACETAMINOPHEN 325 MG PO TABS: 650.0000 mg | ORAL_TABLET | ORAL | Status: DC | PRN

## 2018-01-12 MED ORDER — NITROGLYCERIN 0.4 MG SL SUBL: 0.4000 mg | SUBLINGUAL_TABLET | SUBLINGUAL | Status: DC | PRN

## 2018-01-12 MED ORDER — DOCUSATE SODIUM 100 MG PO CAPS
100.0000 mg | ORAL_CAPSULE | Freq: Two times a day (BID) | ORAL | Status: DC
  Administered 2018-01-12 – 2018-01-13 (×3): 100 mg via ORAL
  Filled 2018-01-12 (×3): qty 1

## 2018-01-12 MED ORDER — MORPHINE SULFATE (PF) 2 MG/ML IV SOLN
1.0000 mg | INTRAVENOUS | Status: DC | PRN
  Administered 2018-01-12: 1 mg via INTRAVENOUS
  Filled 2018-01-12: qty 1

## 2018-01-12 MED ORDER — ASPIRIN EC 81 MG PO TBEC
81.0000 mg | DELAYED_RELEASE_TABLET | Freq: Every day | ORAL | Status: DC
  Administered 2018-01-12: 81 mg via ORAL
  Filled 2018-01-12 (×2): qty 1

## 2018-01-12 MED ORDER — IPRATROPIUM-ALBUTEROL 0.5-2.5 (3) MG/3ML IN SOLN
3.0000 mL | RESPIRATORY_TRACT | Status: DC | PRN
  Administered 2018-01-12: 3 mL via RESPIRATORY_TRACT

## 2018-01-12 MED ORDER — FUROSEMIDE 40 MG PO TABS: 40.0000 mg | ORAL_TABLET | Freq: Every day | ORAL | Status: DC

## 2018-01-12 NOTE — ED Triage Notes (Signed)
Pt states abd pain (mid center abd) beginning May 25th approx 1030 am after loading hay, vomited throughout yesterday and last night. denies diarrhea.

## 2018-01-12 NOTE — H&P (Signed)
History and Physical  [name] WSF:[id number] DOB: [birth year] DOA: 01/12/2018  Referring physician: Sabra Heck MD PCP: Chipper Herb, MD   Chief Complaint: Abdominal pain, not feeling well  HPI: [name] is a 84 y.o. male he has a known history of atrial fibrillation on Eliquis, history of coronary disease status post bypass grafting in 2015, history of severe acid reflux related to a large paraesophageal hiatal hernia.  He presents to ED from home with his son stating that he started having pain approximately 26 hours ago located in the lower abdomen, this pain had persisted throughout the day and was associated with vomiting. He has had poor appetite and nothing to eat since yesterday lunchtime.  There was no associated fevers or diarrhea in fact he states he stays constipated most of the time.  He reports that over the last 12 hours he had some of the pain radiating up into his chest with a sour tasting material in his mouth but also had some discomfort in his bilateral shoulders during this time.  His son reports that he has a history of prior sepsis related to gallbladder  problems, he still has his gallbladder.  The patient denies prior abdominal surgical history other than multiple hernia repairs over time.  He was seen in ED and he did have a CT abdomen that was positive for the known large hiatal hernia but no acute findings.  The patient has been eating and drinking in the ED today.  He has no emesis. His abdominal pain resolved in the ED.  He was noted to have mildly elevated troponin.  Upon review of his old labs, he tends to have an elevated troponin for the last few years with troponin as high as 0.14.  The patient is going to be admitted for observation to have his troponins trended and to rule out acute MI and to treat his GERD symptoms.   Review of Systems: All systems reviewed and apart from history of presenting illness, are negative.  Past Medical History:  Diagnosis Date    . Arthritis   . Atrial fibrillation and flutter ([place])   . Choledocholithiasis   . CKD (chronic kidney disease) 08/2013   stage 3  . Coronary artery disease    CABG x 4 09/03/13 with LIMA to LAD, SVG to diag, SVG sequential to OM 1 and OM2  . Dysrhythmia    atrial fib  . GERD (gastroesophageal reflux disease)    Large HH with intrathoracic component.   . H/O hiatal hernia   . Hyperlipemia   . Hypertension   . Hypothyroidism   . Pneumonia ~ 1935  . Prostate cancer [facility])    Past Surgical History:  Procedure Laterality Date  . CATARACT EXTRACTION Bilateral   . COLONOSCOPY  2003, 01/2012   non-bleeding cecal AVMs, descending and sigmoid tics.   . CORONARY ARTERY BYPASS GRAFT N/A 09/03/2013   Procedure: CORONARY ARTERY BYPASS GRAFTING times four using left internal mammary and bilateral saphenous vein.;  Surgeon: Ivin Poot, MD;  Location: [place];  Service: Open Heart Surgery;  Laterality: N/A;  . ERCP N/A 04/04/2015   Procedure: ENDOSCOPIC RETROGRADE CHOLANGIOPANCREATOGRAPHY (ERCP);  Surgeon: Inda Castle, MD;  Location: [place];  Service: Endoscopy;  Laterality: N/A;  . ERCP N/A 04/06/2015   Procedure: ENDOSCOPIC RETROGRADE CHOLANGIOPANCREATOGRAPHY (ERCP);  Surgeon: Inda Castle, MD;  Location: [facility];  Service: Endoscopy;  Laterality: N/A;  . ESOPHAGOGASTRODUODENOSCOPY  01/2012   11  cm sliding hiatal hernia.  [facility] EYE SURGERY     cataracts removed, ? IOL  . HERNIA REPAIR     abdominal?  . INGUINAL HERNIA REPAIR Left 10/02/2016  . INGUINAL HERNIA REPAIR Left 10/02/2016   Procedure: REPAIR LEFT INGUINAL HERNIA WITH MESH;  Surgeon: Georganna Skeans, MD;  Location: [place];  Service: General;  Laterality: Left;  . INSERTION OF MESH Left 10/02/2016   Procedure: INSERTION OF MESH;  Surgeon: Georganna Skeans, MD;  Location: [place];  Service: General;  Laterality: Left;  . INTRAOPERATIVE TRANSESOPHAGEAL ECHOCARDIOGRAM N/A 09/03/2013   Procedure: INTRAOPERATIVE TRANSESOPHAGEAL  ECHOCARDIOGRAM;  Surgeon: Ivin Poot, MD;  Location: [place];  Service: Open Heart Surgery;  Laterality: N/A;  . LEFT HEART CATHETERIZATION WITH CORONARY ANGIOGRAM N/A 08/03/2013   Procedure: LEFT HEART CATHETERIZATION WITH CORONARY ANGIOGRAM;  Surgeon: Blane Ohara, MD;  Location: [facility] CATH LAB;  Service: Cardiovascular;  Laterality: N/A;  . PROSTATE SURGERY    . THORACOTOMY Right As a young child, ? 14 years old   for pneumonia   . TOTAL KNEE ARTHROPLASTY Right 03/11/2014   Procedure: TOTAL KNEE ARTHROPLASTY;  Surgeon: Hessie Dibble, MD;  Location: [place];  Service: Orthopedics;  Laterality: Right;  . TOTAL KNEE ARTHROPLASTY Left 09/20/2015   Procedure: TOTAL KNEE ARTHROPLASTY;  Surgeon: Melrose Nakayama, MD;  Location: [place];  Service: Orthopedics;  Laterality: Left;   Social History:  reports that he quit smoking about 39 years ago. His smoking use included cigarettes. He smoked 1.00 pack per day. He has never used smokeless tobacco. He reports that he does not drink alcohol or use drugs.  Allergies  Allergen Reactions  . Lipitor [Atorvastatin] Other (See Comments)    "feel bad"  . Doxycycline Rash    Redness to face and hands  . Penicillins Itching    Has patient had a PCN reaction causing immediate rash, facial/tongue/throat swelling, SOB or lightheadedness with hypotension: Yes Has patient had a PCN reaction causing severe rash involving mucus membranes or skin necrosis: No Has patient had a PCN reaction that required hospitalization No Has patient had a PCN reaction occurring within the last 10 years: No If all of the above answers are "NO", then may proceed with Cephalosporin use.     Family History  Problem Relation Age of Onset  . Cancer Mother        bone marrow  . Cancer Father   . Cancer Brother        skin  . Healthy Son   . Colon cancer Neg Hx     Prior to Admission medications   Medication Sig Start Date End Date Taking? Authorizing Provider  acetaminophen  (TYLENOL) 325 MG tablet Take 325-650 mg by mouth every 6 (six) hours as needed (for pain.).   Yes [provider]  Ca Carbonate-Mag Hydroxide (ROLAIDS PO) Take 1-2 tablets by mouth 3 (three) times daily as needed (for heartburn/indigestion).   Yes [provider]  carbamide peroxide (DEBROX) 6.5 % OTIC solution 5 drops 2 (two) times daily. To help clean out ears   Yes [provider]  Cholecalciferol (VITAMIN D3) 1000 UNITS CAPS Take 1,000 Units by mouth every morning.    Yes [provider]  diltiazem (CARDIZEM CD) 240 MG 24 hr capsule Take 1 capsule (240 mg total) by mouth daily. 08/30/17  Yes Chipper Herb, MD  ELIQUIS 2.5 MG TABS tablet TAKE 1 TABLET TWICE DAILY 11/02/17  Yes Chipper Herb, MD  fesoterodine (TOVIAZ) 4 MG TB24 tablet Take 1 tablet (4 mg total) by mouth daily. 08/30/17  Yes Chipper Herb, MD  furosemide (LASIX) 40 MG tablet Take 1 tablet (40 mg total) by mouth daily. 08/30/17  Yes Chipper Herb, MD  levothyroxine (SYNTHROID, LEVOTHROID) 25 MCG tablet Take 1 tablet (25 mcg total) by mouth daily before breakfast. 08/30/17  Yes Chipper Herb, MD  omeprazole (PRILOSEC) 40 MG capsule Take 1 capsule (40 mg total) by mouth daily. 08/30/17  Yes Chipper Herb, MD  oxymetazoline (AFRIN) 0.05 % nasal spray Place 1 spray into both nostrils 2 (two) times daily as needed for congestion. 08/30/17  Yes Chipper Herb, MD  pramipexole (MIRAPEX) 1 MG tablet Take 1.5 tablets (1.5 mg total) by mouth at bedtime. 08/30/17  Yes Chipper Herb, MD  pravastatin (PRAVACHOL) 40 MG tablet Take 1 tablet (40 mg total) by mouth daily. 08/30/17  Yes Chipper Herb, MD  traMADol (ULTRAM) 50 MG tablet Take 1 tablet (50 mg total) by mouth every 6 (six) hours as needed for moderate pain. 10/03/16  Yes Georganna Skeans, MD  traZODone (DESYREL) 100 MG tablet Take 1/2 tablet by mouth at bedtime if needed 01/01/18  Yes Chipper Herb, MD  nitroGLYCERIN (NITROSTAT) 0.4 MG SL tablet  Place 1 tablet (0.4 mg total) under the tongue every 5 (five) minutes as needed for chest pain. 06/06/16 09/20/17  Minus Breeding, MD   Physical Exam: Vitals:   01/12/18 0905 01/12/18 0930 01/12/18 1000 01/12/18 1030  BP: (!) 184/87 (!) 167/75 (!) 161/70 (!) 159/84  Pulse: 82 77  80  Resp: 13 16 12 14   Temp:      TempSrc:      SpO2: 100% 100%  100%  Weight:      Height:         General exam: thin, moderately built and nourished patient, lying comfortably supine on the gurney in no obvious distress.  Head, eyes and ENT: Nontraumatic and normocephalic. Pupils equally reacting to light and accommodation. Oral mucosa dry.  Neck: Supple. No JVD, carotid bruit or thyromegaly.  Lymphatics: No lymphadenopathy.  Respiratory system: Clear to auscultation. No increased work of breathing.  Cardiovascular system: S1 and S2 heard. No JVD, murmurs, gallops, clicks or pedal edema.  Gastrointestinal system: Abdomen is nondistended, soft and mild LLQ tenderness, no guarding, no rebound tenderness. Normal bowel sounds heard. No organomegaly or masses appreciated.  Central nervous system: Alert and oriented. No focal neurological deficits.  Extremities: Symmetric 5 x 5 power. Peripheral pulses symmetrically felt.   Skin: No rashes or acute findings.  Musculoskeletal system: Negative exam.  Psychiatry: Pleasant and cooperative.  Labs on Admission:  Basic Metabolic Panel: Recent Labs  Lab 01/12/18 0643  NA 141  K 3.1*  CL 100*  CO2 30  GLUCOSE 102*  BUN 19  CREATININE 1.24  CALCIUM 10.0   Liver Function Tests: Recent Labs  Lab 01/12/18 0643  AST 22  ALT 15*  ALKPHOS 84  BILITOT 0.9  PROT 6.8  ALBUMIN 3.8   Recent Labs  Lab 01/12/18 0643  LIPASE 38   No results for input(s): AMMONIA in the last 168 hours. CBC: Recent Labs  Lab 01/12/18 0643  WBC 7.7  NEUTROABS 6.5  HGB 12.5*  HCT 36.8*  MCV 94.6  PLT 233   Cardiac Enzymes: Recent Labs  Lab 01/12/18 0643    TROPONINI 0.09*    BNP (last 3 results) No results for input(s): PROBNP  in the last 8760 hours. CBG: No results for input(s): GLUCAP in the last 168 hours.  Radiological Exams on Admission: Ct Abdomen Pelvis W Contrast  Result Date: 01/12/2018 CLINICAL DATA:  Abd pain (mid center abd) beginning May 25th approx 1030 am after loading hay, vomited throughout yesterday and last night. denies diarrhea. Hx of prostate cancer, HTN,GERTD,CKD stage III. EXAM: CT ABDOMEN AND PELVIS WITH CONTRAST TECHNIQUE: Multidetector CT imaging of the abdomen and pelvis was performed using the standard protocol following bolus administration of intravenous contrast. CONTRAST:  163mL ISOVUE-300 IOPAMIDOL (ISOVUE-300) INJECTION 61% COMPARISON:  03/29/2015 FINDINGS: Lower chest: Large hiatal hernia involving body and antrum of the stomach, and distal transverse colon. Previous median sternotomy. Trace left pleural effusion Hepatobiliary: No focal liver abnormality is seen. No gallstones, gallbladder [place] thickening, or biliary dilatation. Pancreas: Unremarkable. No pancreatic ductal dilatation or surrounding inflammatory changes. Spleen: Normal in size without focal abnormality. Adrenals/Urinary Tract: Adrenal glands are unremarkable. Kidneys are normal, without renal calculi, focal lesion, or hydronephrosis. Bladder is unremarkable. Stomach/Bowel: Stomach is distended. There is a large hiatal hernia, incompletely visualized, with involvement of both the fundus and gastric antrum with significant displacement of the pylorus to the level of the diaphragm. This results in 3 areas of relative narrowing of the lumen of the stomach near the fundus, antrum, and pylorus. Small bowel is decompressed. Appendix not identified. The colon is dilated. The distal transverse colon is involved in the hiatal hernia, without evidence of obstruction or strangulation. Scattered diverticula from the sigmoid segment without significant adjacent  inflammatory/edematous change. Rectum decompressed. Vascular/Lymphatic: Coarse aortoiliac atheromatous calcifications without aneurysm. No abdominal or pelvic adenopathy. Portal vein patent. Reproductive: Surgical clips from prostate surgery. Left inguinal hernia containing only mesenteric fat. Other: No ascites.  Pelvic vascular clips.  No free air. Musculoskeletal: Advanced right hip DJD. Right hip effusion with fluid extending into the iliopsoas bursae, and surrounding synovial thickening. Spondylitic changes in the lower thoracic and lumbar spine with a mild lumbar levoscoliosis apex L2. No fracture or worrisome bone lesion. IMPRESSION: 1. Progressive large hiatal hernia involving stomach and distal transverse colon. 3 areas of relative luminal narrowing in the stomach may result in a degree of relative obstruction and account for the patient's symptoms. and 2. Advanced right hip DJD with effusion and iliopsoas bursitis with effusion. 3. Sigmoid diverticulosis. Electronically Signed   By: Lucrezia Europe M.D.   On: 01/12/2018 10:34   Dg Chest Port 1 View  Result Date: 01/12/2018 CLINICAL DATA:  Chest pain beginning yesterday. EXAM: PORTABLE CHEST 1 VIEW COMPARISON:  Two-view chest x-ray 12/18/2016. FINDINGS: The heart is enlarged. A large hiatal hernia is again noted. There is no edema or effusion. No focal airspace disease is present. Low lung volumes are present. The visualized soft tissues and bony thorax are unremarkable. IMPRESSION: 1. Large hiatal hernia. 2. Low lung volumes without acute cardiopulmonary disease. Electronically Signed   By: San Morelle M.D.   On: 01/12/2018 09:16   EKG: Personally reviewed. NSR, no acute ST T changes noted.  Assessment/Plan Principal Problem:   Atypical chest pain Active Problems:   Atrial flutter ([facility])   Large Hiatal hernia   Abnormal liver enzymes   Anemia, chronic renal failure   Aortic atherosclerosis ([facility])   S/P left inguinal hernia repair    Essential hypertension   Senile purpura ([facility])   GERD (gastroesophageal reflux disease)   Atrial fibrillation and flutter ([place])  1. Atypical chest pain -the patient's symptoms appear to be related  to uncontrolled acid reflux disease.  However with his history of coronary artery disease and left shoulder complaints will observe, monitor on telemetry, trend troponins and check an echocardiogram for [place] motion abnormalities.  The patient reports that the shoulder discomfort is improving.  Nitroglycerin, oxygen, aspirin and morphine ordered as needed for symptoms.  Monitor closely and if troponins remain stable likely could discharge tomorrow. 2. Severe large hiatal hernia-he is having some noticeable compression in the stomach.  I am maintaining him on a soft diet.  We will try to control his acid reflux symptoms which are fairly severe.  IV famotidine ordered.  Protonix 80 mg daily ordered. 3. Hypokalemia -oral replacement ordered.  Recheck in the morning. 4. Elevated troponin- undetermined significance given his history of elevated troponins however he does have coronary artery disease and will cycle the troponins.  Monitor on telemetry.  Check an echocardiogram. 5. CKD stage III-stable. 6. History of elevated LFTs-stable. 7. A. fib/flutter- resume home medications.  Patient is on apixaban for anticoagulation which we will continue. 8. Anemia chronic disease-stable. 9. Essential hypertension- currently this is suboptimally controlled, patient admits he has not had his home medicines today, plan to resume all of his home blood pressure medications and follow.  DVT Prophylaxis: Apixaban Code Status: Full Family Communication: Son at the bedside Disposition Plan: Home tomorrow if stable  Time spent: 50 minutes  Irwin Brakeman, MD [facility] Pager [phone]  If 7PM-7AM, please contact night-coverage [email or web address] Password [email or web address] 01/12/2018, 11:20 AM

## 2018-01-12 NOTE — ED Notes (Signed)
Pt aware of need for urine sample, pt receiving fluids at this time. Will remind pt of need for sample

## 2018-01-12 NOTE — ED Notes (Signed)
Pt given breakfast tray per Dr. Sabra Heck

## 2018-01-12 NOTE — ED Notes (Signed)
Checked with pt for urine sample,pt can't go right now,stated he wasn't ate or drunk anything since yesterday.

## 2018-01-12 NOTE — ED Notes (Signed)
CRITICAL VALUE ALERT  Critical Value:  Troponin 0.09  Date & Time Notied:  01/12/2018  Provider Notified: Dr. Sabra Heck  Orders Received/Actions taken:

## 2018-01-12 NOTE — ED Provider Notes (Signed)
[facility] EMERGENCY DEPARTMENT Provider Note   CSN: [id number] Arrival date & time: 01/12/18  0272     History   Chief Complaint Chief Complaint  Patient presents with  . Abdominal Pain    HPI [name] is a 84 y.o. male.  HPI  The patient is an 84 year old male, he has a known history of atrial fibrillation on Eliquis, history of coronary disease status post bypass grafting in 2015, history of acid reflux.  He presents to the hospital with his son today, he states that he started having pain approximately 26 hours ago located in the lower abdomen, this pain had persisted throughout the day and was associated with vomiting.  There was no associated fevers or diarrhea in fact he states he stays constipated most of the time.  He reports that over the last 12 hours he had some of the pain radiating up into his chest with a sour tasting material in his mouth but also had some discomfort in his bilateral shoulders during this time.  His son reports that he has a history of prior sepsis related to gallbladder  problems, he still has his gallbladder.  The patient denies prior abdominal surgical history other than multiple hernia repairs over time.  At this time his symptoms are mild to moderate but have been mostly persistent over the last 24 hours  Past Medical History:  Diagnosis Date  . Arthritis   . Atrial fibrillation and flutter ([place])   . Choledocholithiasis   . CKD (chronic kidney disease) 08/2013   stage 3  . Coronary artery disease    CABG x 4 09/03/13 with LIMA to LAD, SVG to diag, SVG sequential to OM 1 and OM2  . Dysrhythmia    atrial fib  . GERD (gastroesophageal reflux disease)    Large HH with intrathoracic component.   . H/O hiatal hernia   . Hyperlipemia   . Hypertension   . Hypothyroidism   . Pneumonia ~ 1935  . Prostate cancer [facility])     Patient Active Problem List   Diagnosis Date Noted  . Senile purpura ([place]) 01/02/2018  . Kidney disease, chronic, stage  III (GFR 30-59 ml/min) ([place]) 04/11/2017  . Essential hypertension 04/11/2017  . S/P left inguinal hernia repair 10/02/2016  . Neuropathy 05/25/2016  . Aortic atherosclerosis ([place]) 05/25/2016  . Primary osteoarthritis of left knee 09/20/2015  . Anemia, chronic renal failure 04/15/2015  . Edema 04/12/2015  . Hyperbilirubinemia   . Choledocholithiasis 04/06/2015  . Hypoxia   . Abdominal pain, epigastric   . Abnormal liver enzymes   . Abnormal LFTs   . Biliary sepsis   . Acute respiratory failure ([place])   . Severe sepsis ([place])   . Bacteremia due to Gram-negative bacteria   . Hiatal hernia   . Sepsis ([place]) 03/28/2015  . Incisional hernia, without obstruction or gangrene 08/17/2014  . Atrial flutter ([place]) 07/28/2014  . Palpitation 06/16/2014  . Right knee DJD 03/11/2014  . Hx of CABG 09/03/2013  . Insomnia 07/27/2013  . Testosterone deficiency 07/27/2013  . Other hyperlipidemia 06/10/2013  . Heart murmur 06/10/2013  . Dyspnea 06/10/2013  . Inguinal hernia, left. 04/04/2012  . Paraesophageal hiatal hernia 03/24/2012  . Prostate cancer ([place]) 01/15/2012  . Special screening for malignant neoplasms, colon 01/15/2012    Past Surgical History:  Procedure Laterality Date  . CATARACT EXTRACTION Bilateral   . COLONOSCOPY  2003, 01/2012   non-bleeding cecal AVMs, descending and sigmoid tics.   Marland Kitchen  CORONARY ARTERY BYPASS GRAFT N/A 09/03/2013   Procedure: CORONARY ARTERY BYPASS GRAFTING times four using left internal mammary and bilateral saphenous vein.;  Surgeon: Ivin Poot, MD;  Location: [place];  Service: Open Heart Surgery;  Laterality: N/A;  . ERCP N/A 04/04/2015   Procedure: ENDOSCOPIC RETROGRADE CHOLANGIOPANCREATOGRAPHY (ERCP);  Surgeon: Inda Castle, MD;  Location: [place];  Service: Endoscopy;  Laterality: N/A;  . ERCP N/A 04/06/2015   Procedure: ENDOSCOPIC RETROGRADE CHOLANGIOPANCREATOGRAPHY (ERCP);  Surgeon: Inda Castle, MD;  Location: [facility];  Service:  Endoscopy;  Laterality: N/A;  . ESOPHAGOGASTRODUODENOSCOPY  01/2012   11 cm sliding hiatal hernia.  Marland Kitchen EYE SURGERY     cataracts removed, ? IOL  . HERNIA REPAIR     abdominal?  . INGUINAL HERNIA REPAIR Left 10/02/2016  . INGUINAL HERNIA REPAIR Left 10/02/2016   Procedure: REPAIR LEFT INGUINAL HERNIA WITH MESH;  Surgeon: Georganna Skeans, MD;  Location: [place];  Service: General;  Laterality: Left;  . INSERTION OF MESH Left 10/02/2016   Procedure: INSERTION OF MESH;  Surgeon: Georganna Skeans, MD;  Location: [place];  Service: General;  Laterality: Left;  . INTRAOPERATIVE TRANSESOPHAGEAL ECHOCARDIOGRAM N/A 09/03/2013   Procedure: INTRAOPERATIVE TRANSESOPHAGEAL ECHOCARDIOGRAM;  Surgeon: Ivin Poot, MD;  Location: [place];  Service: Open Heart Surgery;  Laterality: N/A;  . LEFT HEART CATHETERIZATION WITH CORONARY ANGIOGRAM N/A 08/03/2013   Procedure: LEFT HEART CATHETERIZATION WITH CORONARY ANGIOGRAM;  Surgeon: Blane Ohara, MD;  Location: [facility] CATH LAB;  Service: Cardiovascular;  Laterality: N/A;  . PROSTATE SURGERY    . THORACOTOMY Right As a young child, ? 39 years old   for pneumonia   . TOTAL KNEE ARTHROPLASTY Right 03/11/2014   Procedure: TOTAL KNEE ARTHROPLASTY;  Surgeon: Hessie Dibble, MD;  Location: [place];  Service: Orthopedics;  Laterality: Right;  . TOTAL KNEE ARTHROPLASTY Left 09/20/2015   Procedure: TOTAL KNEE ARTHROPLASTY;  Surgeon: Melrose Nakayama, MD;  Location: [place];  Service: Orthopedics;  Laterality: Left;        Home Medications    Prior to Admission medications   Medication Sig Start Date End Date Taking? Authorizing Provider  acetaminophen (TYLENOL) 325 MG tablet Take 325-650 mg by mouth every 6 (six) hours as needed (for pain.).   Yes [provider]  Ca Carbonate-Mag Hydroxide (ROLAIDS PO) Take 1-2 tablets by mouth 3 (three) times daily as needed (for heartburn/indigestion).   Yes [provider]  carbamide peroxide (DEBROX) 6.5 % OTIC solution 5  drops 2 (two) times daily. To help clean out ears   Yes [provider]  Cholecalciferol (VITAMIN D3) 1000 UNITS CAPS Take 1,000 Units by mouth every morning.    Yes [provider]  diltiazem (CARDIZEM CD) 240 MG 24 hr capsule Take 1 capsule (240 mg total) by mouth daily. 08/30/17  Yes Chipper Herb, MD  ELIQUIS 2.5 MG TABS tablet TAKE 1 TABLET TWICE DAILY 11/02/17  Yes Chipper Herb, MD  fesoterodine (TOVIAZ) 4 MG TB24 tablet Take 1 tablet (4 mg total) by mouth daily. 08/30/17  Yes Chipper Herb, MD  furosemide (LASIX) 40 MG tablet Take 1 tablet (40 mg total) by mouth daily. 08/30/17  Yes Chipper Herb, MD  levothyroxine (SYNTHROID, LEVOTHROID) 25 MCG tablet Take 1 tablet (25 mcg total) by mouth daily before breakfast. 08/30/17  Yes Chipper Herb, MD  omeprazole (PRILOSEC) 40 MG capsule Take 1 capsule (40 mg total) by mouth daily. 08/30/17  Yes Laurance Flatten,  Estella Husk, MD  oxymetazoline (AFRIN) 0.05 % nasal spray Place 1 spray into both nostrils 2 (two) times daily as needed for congestion. 08/30/17  Yes Chipper Herb, MD  pramipexole (MIRAPEX) 1 MG tablet Take 1.5 tablets (1.5 mg total) by mouth at bedtime. 08/30/17  Yes Chipper Herb, MD  pravastatin (PRAVACHOL) 40 MG tablet Take 1 tablet (40 mg total) by mouth daily. 08/30/17  Yes Chipper Herb, MD  traMADol (ULTRAM) 50 MG tablet Take 1 tablet (50 mg total) by mouth every 6 (six) hours as needed for moderate pain. 10/03/16  Yes Georganna Skeans, MD  traZODone (DESYREL) 100 MG tablet Take 1/2 tablet by mouth at bedtime if needed 01/01/18  Yes Chipper Herb, MD  nitroGLYCERIN (NITROSTAT) 0.4 MG SL tablet Place 1 tablet (0.4 mg total) under the tongue every 5 (five) minutes as needed for chest pain. 06/06/16 09/20/17  Minus Breeding, MD    Family History Family History  Problem Relation Age of Onset  . Cancer Mother        bone marrow  . Cancer Father   . Cancer Brother        skin  . Healthy Son   . Colon cancer Neg Hx      Social History Social History   Tobacco Use  . Smoking status: Former Smoker    Packs/day: 1.00    Types: Cigarettes    Last attempt to quit: 08/20/1978    Years since quitting: 39.4  . Smokeless tobacco: Never Used  Substance Use Topics  . Alcohol use: No  . Drug use: No     Allergies   Lipitor [atorvastatin]; Doxycycline; and Penicillins   Review of Systems Review of Systems  All other systems reviewed and are negative.    Physical Exam Updated Vital Signs BP (!) 159/84   Pulse 80   Temp 98 F (36.7 C) (Oral)   Resp 14   Ht 5\' 11"  (1.803 m)   Wt 67.6 kg (149 lb)   SpO2 100%   BMI 20.78 kg/m   Physical Exam  Constitutional: He appears well-developed and well-nourished. No distress.  HENT:  Head: Normocephalic and atraumatic.  Mouth/Throat: Oropharynx is clear and moist. No oropharyngeal exudate.  Eyes: Pupils are equal, round, and reactive to light. Conjunctivae and EOM are normal. Right eye exhibits no discharge. Left eye exhibits no discharge. No scleral icterus.  Neck: Normal range of motion. Neck supple. No JVD present. No thyromegaly present.  Cardiovascular: Normal rate, regular rhythm and intact distal pulses. Exam reveals no gallop and no friction rub.  Murmur ( Early systolic murmur) heard. Pulmonary/Chest: Effort normal and breath sounds normal. No respiratory distress. He has no wheezes. He has no rales.  Abdominal: Soft. Bowel sounds are normal. He exhibits no distension and no mass. There is tenderness ( Tenderness in the lower abdomen especially on the left, no other abdominal tenderness, very soft, no Murphy sign).  Musculoskeletal: Normal range of motion. He exhibits no edema or tenderness.  Lymphadenopathy:    He has no cervical adenopathy.  Neurological: He is alert. Coordination normal.  Skin: Skin is warm and dry. No rash noted. No erythema.  Psychiatric: He has a normal mood and affect. His behavior is normal.  Nursing note and vitals  reviewed.    ED Treatments / Results  Labs (all labs ordered are listed, but only abnormal results are displayed) Labs Reviewed  COMPREHENSIVE METABOLIC PANEL - Abnormal; Notable for the following components:  Result Value   Potassium 3.1 (*)    Chloride 100 (*)    Glucose, Bld 102 (*)    ALT 15 (*)    GFR calc non Af Amer 50 (*)    GFR calc Af Amer 58 (*)    All other components within normal limits  CBC WITH DIFFERENTIAL/PLATELET - Abnormal; Notable for the following components:   RBC 3.89 (*)    Hemoglobin 12.5 (*)    HCT 36.8 (*)    Lymphs Abs 0.5 (*)    All other components within normal limits  URINALYSIS, ROUTINE W REFLEX MICROSCOPIC - Abnormal; Notable for the following components:   APPearance CLOUDY (*)    Ketones, ur 5 (*)    All other components within normal limits  TROPONIN I - Abnormal; Notable for the following components:   Troponin I 0.09 (*)    All other components within normal limits  LIPASE, BLOOD    EKG EKG Interpretation  Date/Time:  Sunday Jan 12 2018 07:34:46 EDT Ventricular Rate:  75 PR Interval:    QRS Duration: 86 QT Interval:  392 QTC Calculation: 438 R Axis:   -44 Text Interpretation:  Sinus rhythm Multiple premature complexes, vent & supraven Prolonged PR interval Probable left atrial enlargement Left ventricular hypertrophy since last tracing no significant change Confirmed by Noemi Chapel [phone]) on 01/12/2018 7:40:32 AM   Radiology Ct Abdomen Pelvis W Contrast  Result Date: 01/12/2018 CLINICAL DATA:  Abd pain (mid center abd) beginning May 25th approx 1030 am after loading hay, vomited throughout yesterday and last night. denies diarrhea. Hx of prostate cancer, HTN,GERTD,CKD stage III. EXAM: CT ABDOMEN AND PELVIS WITH CONTRAST TECHNIQUE: Multidetector CT imaging of the abdomen and pelvis was performed using the standard protocol following bolus administration of intravenous contrast. CONTRAST:  157mL ISOVUE-300 IOPAMIDOL  (ISOVUE-300) INJECTION 61% COMPARISON:  03/29/2015 FINDINGS: Lower chest: Large hiatal hernia involving body and antrum of the stomach, and distal transverse colon. Previous median sternotomy. Trace left pleural effusion Hepatobiliary: No focal liver abnormality is seen. No gallstones, gallbladder [place] thickening, or biliary dilatation. Pancreas: Unremarkable. No pancreatic ductal dilatation or surrounding inflammatory changes. Spleen: Normal in size without focal abnormality. Adrenals/Urinary Tract: Adrenal glands are unremarkable. Kidneys are normal, without renal calculi, focal lesion, or hydronephrosis. Bladder is unremarkable. Stomach/Bowel: Stomach is distended. There is a large hiatal hernia, incompletely visualized, with involvement of both the fundus and gastric antrum with significant displacement of the pylorus to the level of the diaphragm. This results in 3 areas of relative narrowing of the lumen of the stomach near the fundus, antrum, and pylorus. Small bowel is decompressed. Appendix not identified. The colon is dilated. The distal transverse colon is involved in the hiatal hernia, without evidence of obstruction or strangulation. Scattered diverticula from the sigmoid segment without significant adjacent inflammatory/edematous change. Rectum decompressed. Vascular/Lymphatic: Coarse aortoiliac atheromatous calcifications without aneurysm. No abdominal or pelvic adenopathy. Portal vein patent. Reproductive: Surgical clips from prostate surgery. Left inguinal hernia containing only mesenteric fat. Other: No ascites.  Pelvic vascular clips.  No free air. Musculoskeletal: Advanced right hip DJD. Right hip effusion with fluid extending into the iliopsoas bursae, and surrounding synovial thickening. Spondylitic changes in the lower thoracic and lumbar spine with a mild lumbar levoscoliosis apex L2. No fracture or worrisome bone lesion. IMPRESSION: 1. Progressive large hiatal hernia involving stomach and  distal transverse colon. 3 areas of relative luminal narrowing in the stomach may result in a degree of relative obstruction and account for  the patient's symptoms. and 2. Advanced right hip DJD with effusion and iliopsoas bursitis with effusion. 3. Sigmoid diverticulosis. Electronically Signed   By: Lucrezia Europe M.D.   On: 01/12/2018 10:34   Dg Chest Port 1 View  Result Date: 01/12/2018 CLINICAL DATA:  Chest pain beginning yesterday. EXAM: PORTABLE CHEST 1 VIEW COMPARISON:  Two-view chest x-ray 12/18/2016. FINDINGS: The heart is enlarged. A large hiatal hernia is again noted. There is no edema or effusion. No focal airspace disease is present. Low lung volumes are present. The visualized soft tissues and bony thorax are unremarkable. IMPRESSION: 1. Large hiatal hernia. 2. Low lung volumes without acute cardiopulmonary disease. Electronically Signed   By: San Morelle M.D.   On: 01/12/2018 09:16    Procedures Procedures (including critical care time)  Medications Ordered in ED Medications  0.9 %  sodium chloride infusion ( Intravenous New Bag/Given 01/12/18 0754)  fentaNYL (SUBLIMAZE) injection 50 mcg (50 mcg Intravenous Given 01/12/18 0804)  ondansetron (ZOFRAN) injection 4 mg (4 mg Intravenous Given 01/12/18 0759)  iopamidol (ISOVUE-300) 61 % injection 100 mL (100 mLs Intravenous Contrast Given 01/12/18 0955)     Initial Impression / Assessment and Plan / ED Course  I have reviewed the triage vital signs and the nursing notes.  Pertinent labs & imaging results that were available during my care of the patient were reviewed by me and considered in my medical decision making (see chart for details).     The patient has both abdominal pain as well as chest discomfort, it is not clear exactly what the cause of his symptoms are as his only tenderness is in the left lower quadrant which could be consistent with diverticulitis though I would expect this to give him shoulder discomfort or  nausea and vomiting is much as he has had.  Labs pending, will image after labs help guide this as he has had prior choledocholithiasis and may have recurrent gallbladder stone disease.  We will give some fentanyl and IV fluids.  Labs are unremarkable except for the slightly elevated troponin and a slightly depressed potassium.  I am concerned that he may have some underlying cardiac disease though it does not appear to be an acute issue at this time.  He is symptom-free with regards to his chest and his shoulders.  He does have a possible gastric outlet obstruction based on a CT scan that was performed.  I have discussed his care with the hospitalist Dr. Wynetta Emery who will admit to the hospital.  Discussed with family, they are in agreement  Final Clinical Impressions(s) / ED Diagnoses   Final diagnoses:  Epigastric pain  Elevated troponin      Noemi Chapel, MD 01/12/18 1100

## 2018-01-12 NOTE — ED Notes (Signed)
Pulse ox in the 80's.  Placed on [place] oxygen  at 2 liters.

## 2018-01-13 ENCOUNTER — Observation Stay (HOSPITAL_BASED_OUTPATIENT_CLINIC_OR_DEPARTMENT_OTHER): Payer: Medicare HMO

## 2018-01-13 DIAGNOSIS — I4891 Unspecified atrial fibrillation: Secondary | ICD-10-CM | POA: Diagnosis not present

## 2018-01-13 DIAGNOSIS — K449 Diaphragmatic hernia without obstruction or gangrene: Secondary | ICD-10-CM | POA: Diagnosis not present

## 2018-01-13 DIAGNOSIS — N189 Chronic kidney disease, unspecified: Secondary | ICD-10-CM | POA: Diagnosis not present

## 2018-01-13 DIAGNOSIS — K219 Gastro-esophageal reflux disease without esophagitis: Secondary | ICD-10-CM | POA: Diagnosis not present

## 2018-01-13 DIAGNOSIS — R748 Abnormal levels of other serum enzymes: Secondary | ICD-10-CM

## 2018-01-13 DIAGNOSIS — G4700 Insomnia, unspecified: Secondary | ICD-10-CM | POA: Diagnosis not present

## 2018-01-13 DIAGNOSIS — I10 Essential (primary) hypertension: Secondary | ICD-10-CM

## 2018-01-13 DIAGNOSIS — R0789 Other chest pain: Secondary | ICD-10-CM | POA: Diagnosis not present

## 2018-01-13 DIAGNOSIS — G629 Polyneuropathy, unspecified: Secondary | ICD-10-CM | POA: Diagnosis not present

## 2018-01-13 DIAGNOSIS — E876 Hypokalemia: Secondary | ICD-10-CM | POA: Diagnosis not present

## 2018-01-13 DIAGNOSIS — I700 Atherosclerosis of aorta: Secondary | ICD-10-CM

## 2018-01-13 DIAGNOSIS — N183 Chronic kidney disease, stage 3 (moderate): Secondary | ICD-10-CM

## 2018-01-13 DIAGNOSIS — I5030 Unspecified diastolic (congestive) heart failure: Secondary | ICD-10-CM | POA: Diagnosis not present

## 2018-01-13 LAB — COMPREHENSIVE METABOLIC PANEL
ALT: 17 U/L (ref 17–63)
AST: 28 U/L (ref 15–41)
Albumin: 3.6 g/dL (ref 3.5–5.0)
Alkaline Phosphatase: 87 U/L (ref 38–126)
Anion gap: 13 (ref 5–15)
BUN: 27 mg/dL — AB (ref 6–20)
CHLORIDE: 99 mmol/L — AB (ref 101–111)
CO2: 34 mmol/L — AB (ref 22–32)
CREATININE: 1.84 mg/dL — AB (ref 0.61–1.24)
Calcium: 10.4 mg/dL — ABNORMAL HIGH (ref 8.9–10.3)
GFR calc Af Amer: 36 mL/min — ABNORMAL LOW (ref >60)
GFR calc non Af Amer: 31 mL/min — ABNORMAL LOW (ref >60)
Glucose, Bld: 140 mg/dL — ABNORMAL HIGH (ref 65–99)
Potassium: 3.8 mmol/L (ref 3.5–5.1)
SODIUM: 146 mmol/L — AB (ref 135–145)
Total Bilirubin: 1 mg/dL (ref 0.3–1.2)
Total Protein: 7 g/dL (ref 6.5–8.1)

## 2018-01-13 LAB — MAGNESIUM: Magnesium: 2 mg/dL (ref 1.7–2.4)

## 2018-01-13 LAB — CBC
HCT: 42.3 % (ref 39.0–52.0)
Hemoglobin: 13.6 g/dL (ref 13.0–17.0)
MCH: 31.4 pg (ref 26.0–34.0)
MCHC: 32.2 g/dL (ref 30.0–36.0)
MCV: 97.7 fL (ref 78.0–100.0)
PLATELETS: 234 10*3/uL (ref 150–400)
RBC: 4.33 MIL/uL (ref 4.22–5.81)
RDW: 13.3 % (ref 11.5–15.5)
WBC: 9.3 10*3/uL (ref 4.0–10.5)

## 2018-01-13 LAB — ECHOCARDIOGRAM COMPLETE
HEIGHTINCHES: 71 in
WEIGHTICAEL: 2275.15 [oz_av]

## 2018-01-13 LAB — TROPONIN I: TROPONIN I: 0.07 ng/mL — AB (ref <0.03)

## 2018-01-13 MED ORDER — FUROSEMIDE 40 MG PO TABS: 40.0000 mg | ORAL_TABLET | Freq: Every day | ORAL | Status: DC

## 2018-01-13 MED ORDER — FUROSEMIDE 40 MG PO TABS: 40.0000 mg | ORAL_TABLET | Freq: Every day | ORAL | 3 refills | Status: DC

## 2018-01-13 MED ORDER — DEXTROSE 5 % IV BOLUS
500.0000 mL | Freq: Once | INTRAVENOUS | Status: AC
  Administered 2018-01-13: 500 mL via INTRAVENOUS

## 2018-01-13 NOTE — Progress Notes (Signed)
OT Cancellation Note  Patient Details Name: [name] MRN: [id number] DOB: [birth year]   Cancelled Treatment:    Reason Eval/Treat Not Completed: Medical issues which prohibited therapy   Chart review revealed patient has significant history of  Atrial fib and coronary artery disease. The patient has been admitted to rule out acute MI and treat his GERD symptoms per MD H&P note. Since admission the patient's Troponin levels remain elevated and have increased over last 2 readings. Will hold and evaluate/treat at later date/time when values begin trending down again and patient is medically ready.   Ailene Ravel, OTR/L,CBIS  [phone]  01/13/2018, 9:59 AM

## 2018-01-13 NOTE — Progress Notes (Signed)
  Echocardiogram 2D Echocardiogram has been performed.  [name] 01/13/2018, 9:55 AM

## 2018-01-13 NOTE — Discharge Instructions (Signed)
Follow with Primary MD  Chipper Herb, MD  and other consultant's as instructed your Hospitalist MD  Please get a complete blood count and chemistry panel checked by your Primary MD at your next visit, and again as instructed by your Primary MD.  Get Medicines reviewed and adjusted: Please take all your medications with you for your next visit with your Primary MD  Laboratory/radiological data: Please request your Primary MD to go over all hospital tests and procedure/radiological results at the follow up, please ask your Primary MD to get all Hospital records sent to his/her office.  In some cases, they will be blood work, cultures and biopsy results pending at the time of your discharge. Please request that your primary care M.D. follows up on these results.  Also Note the following: If you experience worsening of your admission symptoms, develop shortness of breath, life threatening emergency, suicidal or homicidal thoughts you must seek medical attention immediately by calling 911 or calling your MD immediately  if symptoms less severe.  You must read complete instructions/literature along with all the possible adverse reactions/side effects for all the Medicines you take and that have been prescribed to you. Take any new Medicines after you have completely understood and accpet all the possible adverse reactions/side effects.   Do not drive when taking Pain medications or sleeping medications (Benzodaizepines)  Do not take more than prescribed Pain, Sleep and Anxiety Medications. It is not advisable to combine anxiety,sleep and pain medications without talking with your primary care practitioner  Special Instructions: If you have smoked or chewed Tobacco  in the last 2 yrs please stop smoking, stop any regular Alcohol  and or any Recreational drug use.  Wear Seat belts while driving.  Please note: You were cared for by a hospitalist during your hospital stay. Once you are discharged,  your primary care physician will handle any further medical issues. Please note that NO REFILLS for any discharge medications will be authorized once you are discharged, as it is imperative that you return to your primary care physician (or establish a relationship with a primary care physician if you do not have one) for your post hospital discharge needs so that they can reassess your need for medications and monitor your lab values.  Soft-Food Meal Plan A soft-food meal plan includes foods that are safe and easy to swallow. This meal plan typically is used:  If you are having trouble chewing or swallowing foods.  As a transition meal plan after only having had liquid meals for a long period.  What do I need to know about the soft-food meal plan? A soft-food meal plan includes tender foods that are soft and easy to chew and swallow. In most cases, bite-sized pieces of food are easier to swallow. A bite-sized piece is about  inch or smaller. Foods in this plan do not need to be ground or pureed. Foods that are very hard, crunchy, or sticky should be avoided. Also, breads, cereals, yogurts, and desserts with nuts, seeds, or fruits should be avoided. What foods can I eat? Grains Rice and wild rice. Moist bread, dressing, pasta, and noodles. Well-moistened dry or cooked cereals, such as farina (cooked wheat cereal), oatmeal, or grits. Biscuits, breads, muffins, pancakes, and waffles that have been well moistened. Vegetables Shredded lettuce. Cooked, tender vegetables, including potatoes without skins. Vegetable juices. Broths or creamed soups made with vegetables that are not stringy or chewy. Strained tomatoes (without seeds). Fruits Canned or well-cooked fruits. Soft (  ripe), peeled fresh fruits, such as peaches, nectarines, kiwi, cantaloupe, honeydew melon, and watermelon (without seeds). Soft berries with small seeds, such as strawberries. Fruit juices (without pulp). Meats and Other Protein  Sources Moist, tender, lean beef. Mutton. Lamb. Veal. Chicken. [place]. Liver. Ham. Fish without bones. Eggs. Dairy Milk, milk drinks, and cream. Plain cream cheese and cottage cheese. Plain yogurt. Sweets/Desserts Flavored gelatin desserts. Custard. Plain ice cream, frozen yogurt, sherbet, milk shakes, and malts. Plain cakes and cookies. Plain hard candy. Other Butter, margarine (without trans fat), and cooking oils. Mayonnaise. Cream sauces. Mild spices, salt, and sugar. Syrup, molasses, honey, and jelly. The items listed above may not be a complete list of recommended foods or beverages. Contact your dietitian for more options. What foods are not recommended? Grains Dry bread, toast, crackers that have not been moistened. Coarse or dry cereals, such as bran, granola, and shredded wheat. Tough or chewy crusty breads, such as [place] bread or baguettes. Vegetables Corn. Raw vegetables except shredded lettuce. Cooked vegetables that are tough or stringy. Tough, crisp, fried potatoes and potato skins. Fruits Fresh fruits with skins or seeds or both, such as apples, pears, or grapes. Stringy, high-pulp fruits, such as papaya, pineapple, coconut, or mango. Fruit leather, fruit roll-ups, and all dried fruits. Meats and Other Protein Sources Sausages and hot dogs. Meats with gristle. Fish with bones. Nuts, seeds, and chunky peanut or other nut butters. Sweets/Desserts Cakes or cookies that are very dry or chewy. The items listed above may not be a complete list of foods and beverages to avoid. Contact your dietitian for more information. This information is not intended to replace advice given to you by your health care provider. Make sure you discuss any questions you have with your health care provider. Document Released: 11/13/2007 Document Revised: 01/12/2016 Document Reviewed: 07/03/2013 Elsevier Interactive Patient Education  2017 [employer].

## 2018-01-13 NOTE — Progress Notes (Signed)
Discharge instructions read to patient and family .  All verbalized understanding of instructions.  Discharged to home with family. 

## 2018-01-13 NOTE — Progress Notes (Signed)
PT Cancellation Note  Patient Details Name: [name] MRN: [id number] DOB: December 09, 1926   Cancelled Treatment:    Reason Eval/Treat Not Completed: Patient not medically ready  Chart review revealed patient has significant history of  Atrial fib and coronary artery disease. The patient has been admitted to rule out acute MI and treat his GERD symptoms per MD H&P note. Since admission the patient's Troponin levels remain elevated and have increased over last 2 readings. Will hold and evaluate/treat at later date/time when values begin trending down again and patient is medically ready.  Cardiac Panel (last 3 results) Recent Labs    01/12/18 0643 01/12/18 1917 01/13/18 0135  TROPONINI 0.09* 0.06* 0.07*    Kipp Brood, PT, DPT Physical Therapist with [facility]  01/13/2018 7:45 AM

## 2018-01-13 NOTE — Discharge Summary (Signed)
Physician Discharge Summary  [name] KKX:[id number] DOB: [birth year] DOA: 01/12/2018  PCP: Chipper Herb, MD Cardiologist: Hochrein  Admit date: 01/12/2018 Discharge date: 01/13/2018  Admitted From: Home  Disposition: Home  Recommendations for Outpatient Follow-up:  1. Follow up with PCP in 1 weeks 2. Follow up with cardiologist in 1-2 weeks 3. Follow up with general surgery regarding hiatal hernia 4. Please obtain BMP/CBC in one week 5. Please follow up final echocardiogram report on outpatient follow up.   Discharge Condition: STABLE   CODE STATUS: FULL    Brief Hospitalization Summary: Please see all hospital notes, images, labs for full details of the hospitalization.  HPI: [name] is a 84 y.o. male he has a known history of atrial fibrillation on Eliquis, history of coronary disease status post bypass grafting in 2015, history of severe acid reflux related to a large paraesophageal hiatal hernia. He presents to ED from home with his son stating that he started having pain approximately 26 hours ago located in the lower abdomen, this pain had persisted throughout the day and was associated with vomiting. He has had poor appetite and nothing to eat since yesterday lunchtime.  There was no associated fevers or diarrhea in fact he states he stays constipated most of the time. He reports that over the last 12 hours he had some of the pain radiating up into his chest with a sour tasting material in his mouth but also had some discomfort in his bilateral shoulders during this time. His son reports that he has a history of prior sepsis related to gallbladder problems, he still has his gallbladder. The patient denies prior abdominal surgical history other than multiple hernia repairs over time.  He was seen in ED and he did have a CT abdomen that was positive for the known large hiatal hernia but no acute findings.  The patient has been eating and drinking in the ED today.  He has no  emesis. His abdominal pain resolved in the ED.  He was noted to have mildly elevated troponin.  Upon review of his old labs, he tends to have an elevated troponin for the last few years with troponin as high as 0.14.  The patient is going to be admitted for observation to have his troponins trended and to rule out acute MI and to treat his GERD symptoms.   1. Atypical chest pain -the patient's symptoms appear to be related to uncontrolled acid reflux disease related to his large hiatal hernia.  However with his history of coronary artery disease and left shoulder complaints we observed, monitored on telemetry, trended troponins and checked an echocardiogram for Seydel motion abnormalities.  His troponin trended down from admission 0.09 to 0.07.  His symptoms resolved with treatment of the acid reflux symptoms.  He was placed on a soft diet.  The patient reports that the shoulder discomfort is resolved.  Nitroglycerin, oxygen, aspirin and morphine was ordered as needed for symptoms.  2. Severe large hiatal hernia-he is having some noticeable compression in the stomach.  I am maintaining him on a soft diet.  We will try to control his acid reflux symptoms which are fairly severe.  IV famotidine ordered.  Protonix 80 mg daily ordered. I have asked him to follow up with his general surgeon regarding this.  He verbalized understanding.  3. Hypokalemia -Repleted.   4. Elevated troponin- undetermined significance given his history of elevated troponins however he does have coronary artery disease and will cycle  the troponins.  Monitor on telemetry.  Echocardiogram was ordered and pending.  Follow up results with PCP and cardiologist.  5. CKD stage III-stable. 6. History of elevated LFTs-stable. 7. A. fib/flutter- resume home medications.  Patient is on apixaban for anticoagulation which we will continue. 8. Anemia chronic disease-stable. 9. Essential hypertension- stable on home medications.   DVT Prophylaxis:  Apixaban Code Status: Full Family Communication: Son at the bedside Disposition Plan: Home  Discharge Diagnoses:  Principal Problem:   Atypical chest pain Active Problems:   Paraesophageal hiatal hernia   Insomnia   Atrial flutter (HCC)   Large Hiatal hernia   Abnormal liver enzymes   Anemia, chronic renal failure   Hypokalemia   Neuropathy   Aortic atherosclerosis (HCC)   S/P left inguinal hernia repair   Kidney disease, chronic, stage III (GFR 30-59 ml/min) (HCC)   Essential hypertension   Senile purpura (HCC)   GERD (gastroesophageal reflux disease)   Atrial fibrillation and flutter (HCC)   Coronary artery disease  Discharge Instructions: Discharge Instructions    Call MD for:  difficulty breathing, headache or visual disturbances   Complete by:  As directed    Call MD for:  extreme fatigue   Complete by:  As directed    Call MD for:  persistant dizziness or light-headedness   Complete by:  As directed    Call MD for:  persistant nausea and vomiting   Complete by:  As directed    Call MD for:  severe uncontrolled pain   Complete by:  As directed    Increase activity slowly   Complete by:  As directed      Allergies as of 01/13/2018      Reactions   Lipitor [atorvastatin] Other (See Comments)   "feel bad"   Doxycycline Rash   Redness to face and hands   Penicillins Itching   Has patient had a PCN reaction causing immediate rash, facial/tongue/throat swelling, SOB or lightheadedness with hypotension: Yes Has patient had a PCN reaction causing severe rash involving mucus membranes or skin necrosis: No Has patient had a PCN reaction that required hospitalization No Has patient had a PCN reaction occurring within the last 10 years: No If all of the above answers are "NO", then may proceed with Cephalosporin use.      Medication List    STOP taking these medications   oxymetazoline 0.05 % nasal spray Commonly known as:  AFRIN     TAKE these medications    acetaminophen 325 MG tablet Commonly known as:  TYLENOL Take 325-650 mg by mouth every 6 (six) hours as needed (for pain.).   carbamide peroxide 6.5 % OTIC solution Commonly known as:  DEBROX 5 drops 2 (two) times daily. To help clean out ears   diltiazem 240 MG 24 hr capsule Commonly known as:  CARDIZEM CD Take 1 capsule (240 mg total) by mouth daily.   ELIQUIS 2.5 MG Tabs tablet Generic drug:  apixaban TAKE 1 TABLET TWICE DAILY   fesoterodine 4 MG Tb24 tablet Commonly known as:  TOVIAZ Take 1 tablet (4 mg total) by mouth daily.   furosemide 40 MG tablet Commonly known as:  LASIX Take 1 tablet (40 mg total) by mouth daily. Start taking on:  01/15/2018 What changed:  These instructions start on 01/15/2018. If you are unsure what to do until then, ask your doctor or other care provider.   levothyroxine 25 MCG tablet Commonly known as:  SYNTHROID, LEVOTHROID Take 1 tablet (  25 mcg total) by mouth daily before breakfast.   nitroGLYCERIN 0.4 MG SL tablet Commonly known as:  NITROSTAT Place 1 tablet (0.4 mg total) under the tongue every 5 (five) minutes as needed for chest pain.   omeprazole 40 MG capsule Commonly known as:  PRILOSEC Take 1 capsule (40 mg total) by mouth daily.   pramipexole 1 MG tablet Commonly known as:  MIRAPEX Take 1.5 tablets (1.5 mg total) by mouth at bedtime.   pravastatin 40 MG tablet Commonly known as:  PRAVACHOL Take 1 tablet (40 mg total) by mouth daily.   ROLAIDS PO Take 1-2 tablets by mouth 3 (three) times daily as needed (for heartburn/indigestion).   traMADol 50 MG tablet Commonly known as:  ULTRAM Take 1 tablet (50 mg total) by mouth every 6 (six) hours as needed for moderate pain.   traZODone 100 MG tablet Commonly known as:  DESYREL Take 1/2 tablet by mouth at bedtime if needed   Vitamin D3 1000 units Caps Take 1,000 Units by mouth every morning.      Follow-up Information    Chipper Herb, MD. Schedule an appointment as  soon as possible for a visit in 1 week(s).   Specialty:  Family Medicine Contact information: [place] [address] [phone]        Minus Breeding, MD. Schedule an appointment as soon as possible for a visit in 2 week(s).   Specialty:  Cardiology Why:  Hospital Follow Up  Contact information: [place] [address] [phone]        Aviva Signs, MD. Schedule an appointment as soon as possible for a visit in 2 day(s).   Specialty:  General Surgery Why:  follow up hiatal hernia Contact information: [address] [place] [address] [phone]          Allergies  Allergen Reactions  . Lipitor [Atorvastatin] Other (See Comments)    "feel bad"  . Doxycycline Rash    Redness to face and hands  . Penicillins Itching    Has patient had a PCN reaction causing immediate rash, facial/tongue/throat swelling, SOB or lightheadedness with hypotension: Yes Has patient had a PCN reaction causing severe rash involving mucus membranes or skin necrosis: No Has patient had a PCN reaction that required hospitalization No Has patient had a PCN reaction occurring within the last 10 years: No If all of the above answers are "NO", then may proceed with Cephalosporin use.    Allergies as of 01/13/2018      Reactions   Lipitor [atorvastatin] Other (See Comments)   "feel bad"   Doxycycline Rash   Redness to face and hands   Penicillins Itching   Has patient had a PCN reaction causing immediate rash, facial/tongue/throat swelling, SOB or lightheadedness with hypotension: Yes Has patient had a PCN reaction causing severe rash involving mucus membranes or skin necrosis: No Has patient had a PCN reaction that required hospitalization No Has patient had a PCN reaction occurring within the last 10 years: No If all of the above answers are "NO", then may proceed with Cephalosporin use.      Medication List    STOP taking these medications    oxymetazoline 0.05 % nasal spray Commonly known as:  AFRIN     TAKE these medications   acetaminophen 325 MG tablet Commonly known as:  TYLENOL Take 325-650 mg by mouth every 6 (six) hours as needed (for pain.).   carbamide peroxide 6.5 % OTIC  solution Commonly known as:  DEBROX 5 drops 2 (two) times daily. To help clean out ears   diltiazem 240 MG 24 hr capsule Commonly known as:  CARDIZEM CD Take 1 capsule (240 mg total) by mouth daily.   ELIQUIS 2.5 MG Tabs tablet Generic drug:  apixaban TAKE 1 TABLET TWICE DAILY   fesoterodine 4 MG Tb24 tablet Commonly known as:  TOVIAZ Take 1 tablet (4 mg total) by mouth daily.   furosemide 40 MG tablet Commonly known as:  LASIX Take 1 tablet (40 mg total) by mouth daily. Start taking on:  01/15/2018 What changed:  These instructions start on 01/15/2018. If you are unsure what to do until then, ask your doctor or other care provider.   levothyroxine 25 MCG tablet Commonly known as:  SYNTHROID, LEVOTHROID Take 1 tablet (25 mcg total) by mouth daily before breakfast.   nitroGLYCERIN 0.4 MG SL tablet Commonly known as:  NITROSTAT Place 1 tablet (0.4 mg total) under the tongue every 5 (five) minutes as needed for chest pain.   omeprazole 40 MG capsule Commonly known as:  PRILOSEC Take 1 capsule (40 mg total) by mouth daily.   pramipexole 1 MG tablet Commonly known as:  MIRAPEX Take 1.5 tablets (1.5 mg total) by mouth at bedtime.   pravastatin 40 MG tablet Commonly known as:  PRAVACHOL Take 1 tablet (40 mg total) by mouth daily.   ROLAIDS PO Take 1-2 tablets by mouth 3 (three) times daily as needed (for heartburn/indigestion).   traMADol 50 MG tablet Commonly known as:  ULTRAM Take 1 tablet (50 mg total) by mouth every 6 (six) hours as needed for moderate pain.   traZODone 100 MG tablet Commonly known as:  DESYREL Take 1/2 tablet by mouth at bedtime if needed   Vitamin D3 1000 units Caps Take 1,000 Units by mouth every  morning.       Procedures/Studies: Ct Abdomen Pelvis W Contrast  Result Date: 01/12/2018 CLINICAL DATA:  Abd pain (mid center abd) beginning May 25th approx 1030 am after loading hay, vomited throughout yesterday and last night. denies diarrhea. Hx of prostate cancer, HTN,GERTD,CKD stage III. EXAM: CT ABDOMEN AND PELVIS WITH CONTRAST TECHNIQUE: Multidetector CT imaging of the abdomen and pelvis was performed using the standard protocol following bolus administration of intravenous contrast. CONTRAST:  145mL ISOVUE-300 IOPAMIDOL (ISOVUE-300) INJECTION 61% COMPARISON:  03/29/2015 FINDINGS: Lower chest: Large hiatal hernia involving body and antrum of the stomach, and distal transverse colon. Previous median sternotomy. Trace left pleural effusion Hepatobiliary: No focal liver abnormality is seen. No gallstones, gallbladder Botz thickening, or biliary dilatation. Pancreas: Unremarkable. No pancreatic ductal dilatation or surrounding inflammatory changes. Spleen: Normal in size without focal abnormality. Adrenals/Urinary Tract: Adrenal glands are unremarkable. Kidneys are normal, without renal calculi, focal lesion, or hydronephrosis. Bladder is unremarkable. Stomach/Bowel: Stomach is distended. There is a large hiatal hernia, incompletely visualized, with involvement of both the fundus and gastric antrum with significant displacement of the pylorus to the level of the diaphragm. This results in 3 areas of relative narrowing of the lumen of the stomach near the fundus, antrum, and pylorus. Small bowel is decompressed. Appendix not identified. The colon is dilated. The distal transverse colon is involved in the hiatal hernia, without evidence of obstruction or strangulation. Scattered diverticula from the sigmoid segment without significant adjacent inflammatory/edematous change. Rectum decompressed. Vascular/Lymphatic: Coarse aortoiliac atheromatous calcifications without aneurysm. No abdominal or pelvic  adenopathy. Portal vein patent. Reproductive: Surgical clips from prostate surgery. Left inguinal hernia  containing only mesenteric fat. Other: No ascites.  Pelvic vascular clips.  No free air. Musculoskeletal: Advanced right hip DJD. Right hip effusion with fluid extending into the iliopsoas bursae, and surrounding synovial thickening. Spondylitic changes in the lower thoracic and lumbar spine with a mild lumbar levoscoliosis apex L2. No fracture or worrisome bone lesion. IMPRESSION: 1. Progressive large hiatal hernia involving stomach and distal transverse colon. 3 areas of relative luminal narrowing in the stomach may result in a degree of relative obstruction and account for the patient's symptoms. and 2. Advanced right hip DJD with effusion and iliopsoas bursitis with effusion. 3. Sigmoid diverticulosis. Electronically Signed   By: Lucrezia Europe M.D.   On: 01/12/2018 10:34   Dg Chest Port 1 View  Result Date: 01/12/2018 CLINICAL DATA:  Chest pain beginning yesterday. EXAM: PORTABLE CHEST 1 VIEW COMPARISON:  Two-view chest x-ray 12/18/2016. FINDINGS: The heart is enlarged. A large hiatal hernia is again noted. There is no edema or effusion. No focal airspace disease is present. Low lung volumes are present. The visualized soft tissues and bony thorax are unremarkable. IMPRESSION: 1. Large hiatal hernia. 2. Low lung volumes without acute cardiopulmonary disease. Electronically Signed   By: San Morelle M.D.   On: 01/12/2018 09:16      Subjective: Pt is eating and drinking well and he says he feels much better.  He is having no chest pain and no SOB.   Discharge Exam: Vitals:   01/13/18 1001 01/13/18 1004  BP: 123/73 123/73  Pulse:  92  Resp:    Temp:    SpO2:     Vitals:   01/13/18 0003 01/13/18 0418 01/13/18 1001 01/13/18 1004  BP: 120/68 96/73 123/73 123/73  Pulse: 99 (!) 109  92  Resp: (!) 21 20    Temp: 98.3 F (36.8 C) 98.5 F (36.9 C)    TempSrc: Oral Oral    SpO2: 95% 91%     Weight:      Height:       General: Pt is alert, awake, not in acute distress Cardiovascular: RRR, S1/S2 +, no rubs, no gallops Respiratory: CTA bilaterally, no wheezing, no rhonchi Abdominal: Soft, NT, ND, bowel sounds + Extremities: no edema, no cyanosis   The results of significant diagnostics from this hospitalization (including imaging, microbiology, ancillary and laboratory) are listed below for reference.     Microbiology: No results found for this or any previous visit (from the past 240 hour(s)).   Labs: BNP (last 3 results) No results for input(s): BNP in the last 8760 hours. Basic Metabolic Panel: Recent Labs  Lab 01/12/18 0643 01/13/18 0135  NA 141 146*  K 3.1* 3.8  CL 100* 99*  CO2 30 34*  GLUCOSE 102* 140*  BUN 19 27*  CREATININE 1.24 1.84*  CALCIUM 10.0 10.4*  MG  --  2.0   Liver Function Tests: Recent Labs  Lab 01/12/18 0643 01/13/18 0135  AST 22 28  ALT 15* 17  ALKPHOS 84 87  BILITOT 0.9 1.0  PROT 6.8 7.0  ALBUMIN 3.8 3.6   Recent Labs  Lab 01/12/18 0643  LIPASE 38   No results for input(s): AMMONIA in the last 168 hours. CBC: Recent Labs  Lab 01/12/18 0643 01/13/18 0135  WBC 7.7 9.3  NEUTROABS 6.5  --   HGB 12.5* 13.6  HCT 36.8* 42.3  MCV 94.6 97.7  PLT 233 234   Cardiac Enzymes: Recent Labs  Lab 01/12/18 0643 01/12/18 1917 01/13/18 0135  TROPONINI  0.09* 0.06* 0.07*   BNP: Invalid input(s): POCBNP CBG: No results for input(s): GLUCAP in the last 168 hours. D-Dimer No results for input(s): DDIMER in the last 72 hours. Hgb A1c No results for input(s): HGBA1C in the last 72 hours. Lipid Profile No results for input(s): CHOL, HDL, LDLCALC, TRIG, CHOLHDL, LDLDIRECT in the last 72 hours. Thyroid function studies No results for input(s): TSH, T4TOTAL, T3FREE, THYROIDAB in the last 72 hours.  Invalid input(s): FREET3 Anemia work up No results for input(s): VITAMINB12, FOLATE, FERRITIN, TIBC, IRON, RETICCTPCT in the  last 72 hours. Urinalysis    Component Value Date/Time   COLORURINE YELLOW 01/12/2018 0900   APPEARANCEUR CLOUDY (A) 01/12/2018 0900   APPEARANCEUR Clear 01/02/2018 0946   LABSPEC 1.014 01/12/2018 0900   PHURINE 7.0 01/12/2018 0900   GLUCOSEU NEGATIVE 01/12/2018 0900   HGBUR NEGATIVE 01/12/2018 0900   BILIRUBINUR NEGATIVE 01/12/2018 0900   BILIRUBINUR Negative 01/02/2018 0946   KETONESUR 5 (A) 01/12/2018 0900   PROTEINUR NEGATIVE 01/12/2018 0900   UROBILINOGEN negative 08/03/2016 1701   UROBILINOGEN 0.2 04/09/2015 1631   NITRITE NEGATIVE 01/12/2018 0900   LEUKOCYTESUR NEGATIVE 01/12/2018 0900   LEUKOCYTESUR Negative 01/02/2018 0946   Sepsis Labs Invalid input(s): PROCALCITONIN,  WBC,  LACTICIDVEN Microbiology No results found for this or any previous visit (from the past 240 hour(s)).  Time coordinating discharge:   SIGNED:  Irwin Brakeman, MD  [facility] 01/13/2018, 10:26 AM Pager [phone]  If 7PM-7AM, please contact night-coverage [email or web address] Password [email or web address]

## 2018-01-14 ENCOUNTER — Other Ambulatory Visit: Payer: Self-pay | Admitting: Family Medicine

## 2018-01-14 MED ORDER — FUROSEMIDE 40 MG PO TABS: 40.0000 mg | ORAL_TABLET | Freq: Every day | ORAL | 0 refills | Status: AC

## 2018-01-15 ENCOUNTER — Emergency Department (HOSPITAL_COMMUNITY): Payer: Medicare HMO

## 2018-01-15 ENCOUNTER — Other Ambulatory Visit: Payer: Self-pay

## 2018-01-15 ENCOUNTER — Inpatient Hospital Stay (HOSPITAL_COMMUNITY)
Admission: EM | Admit: 2018-01-15 | Discharge: 2018-01-24 | DRG: 870 | Disposition: A | Discharge status: Home / Self Care | Payer: Medicare HMO | Attending: Emergency Medicine | Admitting: Emergency Medicine

## 2018-01-15 ENCOUNTER — Encounter (HOSPITAL_COMMUNITY): Payer: Self-pay

## 2018-01-15 ENCOUNTER — Telehealth: Payer: Self-pay | Admitting: Family Medicine

## 2018-01-15 DIAGNOSIS — Z888 Allergy status to other drugs, medicaments and biological substances status: Secondary | ICD-10-CM

## 2018-01-15 DIAGNOSIS — R34 Anuria and oliguria: Secondary | ICD-10-CM | POA: Diagnosis not present

## 2018-01-15 DIAGNOSIS — J9600 Acute respiratory failure, unspecified whether with hypoxia or hypercapnia: Secondary | ICD-10-CM

## 2018-01-15 DIAGNOSIS — Z9841 Cataract extraction status, right eye: Secondary | ICD-10-CM

## 2018-01-15 DIAGNOSIS — Z880 Allergy status to penicillin: Secondary | ICD-10-CM

## 2018-01-15 DIAGNOSIS — L8990 Pressure ulcer of unspecified site, unspecified stage: Secondary | ICD-10-CM

## 2018-01-15 DIAGNOSIS — Z7901 Long term (current) use of anticoagulants: Secondary | ICD-10-CM

## 2018-01-15 DIAGNOSIS — I4891 Unspecified atrial fibrillation: Secondary | ICD-10-CM | POA: Diagnosis present

## 2018-01-15 DIAGNOSIS — Z96653 Presence of artificial knee joint, bilateral: Secondary | ICD-10-CM | POA: Diagnosis present

## 2018-01-15 DIAGNOSIS — N170 Acute kidney failure with tubular necrosis: Secondary | ICD-10-CM | POA: Diagnosis present

## 2018-01-15 DIAGNOSIS — J690 Pneumonitis due to inhalation of food and vomit: Secondary | ICD-10-CM | POA: Diagnosis present

## 2018-01-15 DIAGNOSIS — I2510 Atherosclerotic heart disease of native coronary artery without angina pectoris: Secondary | ICD-10-CM | POA: Diagnosis present

## 2018-01-15 DIAGNOSIS — K2951 Unspecified chronic gastritis with bleeding: Secondary | ICD-10-CM | POA: Diagnosis present

## 2018-01-15 DIAGNOSIS — K449 Diaphragmatic hernia without obstruction or gangrene: Secondary | ICD-10-CM

## 2018-01-15 DIAGNOSIS — Z79890 Hormone replacement therapy (postmenopausal): Secondary | ICD-10-CM

## 2018-01-15 DIAGNOSIS — I952 Hypotension due to drugs: Secondary | ICD-10-CM | POA: Diagnosis not present

## 2018-01-15 DIAGNOSIS — K920 Hematemesis: Secondary | ICD-10-CM | POA: Diagnosis present

## 2018-01-15 DIAGNOSIS — D631 Anemia in chronic kidney disease: Secondary | ICD-10-CM | POA: Diagnosis present

## 2018-01-15 DIAGNOSIS — I482 Chronic atrial fibrillation: Secondary | ICD-10-CM | POA: Diagnosis present

## 2018-01-15 DIAGNOSIS — I214 Non-ST elevation (NSTEMI) myocardial infarction: Secondary | ICD-10-CM

## 2018-01-15 DIAGNOSIS — K440 Diaphragmatic hernia with obstruction, without gangrene: Secondary | ICD-10-CM | POA: Diagnosis present

## 2018-01-15 DIAGNOSIS — I350 Nonrheumatic aortic (valve) stenosis: Secondary | ICD-10-CM | POA: Diagnosis present

## 2018-01-15 DIAGNOSIS — D62 Acute posthemorrhagic anemia: Secondary | ICD-10-CM | POA: Diagnosis present

## 2018-01-15 DIAGNOSIS — I272 Pulmonary hypertension, unspecified: Secondary | ICD-10-CM | POA: Diagnosis present

## 2018-01-15 DIAGNOSIS — C61 Malignant neoplasm of prostate: Secondary | ICD-10-CM | POA: Diagnosis present

## 2018-01-15 DIAGNOSIS — R6521 Severe sepsis with septic shock: Secondary | ICD-10-CM | POA: Diagnosis present

## 2018-01-15 DIAGNOSIS — A419 Sepsis, unspecified organism: Principal | ICD-10-CM | POA: Diagnosis present

## 2018-01-15 DIAGNOSIS — K922 Gastrointestinal hemorrhage, unspecified: Secondary | ICD-10-CM

## 2018-01-15 DIAGNOSIS — I4892 Unspecified atrial flutter: Secondary | ICD-10-CM

## 2018-01-15 DIAGNOSIS — Z9842 Cataract extraction status, left eye: Secondary | ICD-10-CM

## 2018-01-15 DIAGNOSIS — Z681 Body mass index (BMI) 19 or less, adult: Secondary | ICD-10-CM

## 2018-01-15 DIAGNOSIS — Z79891 Long term (current) use of opiate analgesic: Secondary | ICD-10-CM

## 2018-01-15 DIAGNOSIS — T82594A Other mechanical complication of infusion catheter, initial encounter: Secondary | ICD-10-CM

## 2018-01-15 DIAGNOSIS — Z951 Presence of aortocoronary bypass graft: Secondary | ICD-10-CM

## 2018-01-15 DIAGNOSIS — I130 Hypertensive heart and chronic kidney disease with heart failure and stage 1 through stage 4 chronic kidney disease, or unspecified chronic kidney disease: Secondary | ICD-10-CM | POA: Diagnosis present

## 2018-01-15 DIAGNOSIS — J449 Chronic obstructive pulmonary disease, unspecified: Secondary | ICD-10-CM | POA: Diagnosis present

## 2018-01-15 DIAGNOSIS — K209 Esophagitis, unspecified without bleeding: Secondary | ICD-10-CM

## 2018-01-15 DIAGNOSIS — K210 Gastro-esophageal reflux disease with esophagitis: Secondary | ICD-10-CM | POA: Diagnosis present

## 2018-01-15 DIAGNOSIS — Z0189 Encounter for other specified special examinations: Secondary | ICD-10-CM

## 2018-01-15 DIAGNOSIS — Z9911 Dependence on respirator [ventilator] status: Secondary | ICD-10-CM

## 2018-01-15 DIAGNOSIS — Z4659 Encounter for fitting and adjustment of other gastrointestinal appliance and device: Secondary | ICD-10-CM

## 2018-01-15 DIAGNOSIS — E785 Hyperlipidemia, unspecified: Secondary | ICD-10-CM | POA: Diagnosis present

## 2018-01-15 DIAGNOSIS — Z79899 Other long term (current) drug therapy: Secondary | ICD-10-CM

## 2018-01-15 DIAGNOSIS — J810 Acute pulmonary edema: Secondary | ICD-10-CM | POA: Diagnosis not present

## 2018-01-15 DIAGNOSIS — I248 Other forms of acute ischemic heart disease: Secondary | ICD-10-CM | POA: Diagnosis present

## 2018-01-15 DIAGNOSIS — Z978 Presence of other specified devices: Secondary | ICD-10-CM

## 2018-01-15 DIAGNOSIS — E870 Hyperosmolality and hypernatremia: Secondary | ICD-10-CM | POA: Diagnosis not present

## 2018-01-15 DIAGNOSIS — Z87891 Personal history of nicotine dependence: Secondary | ICD-10-CM

## 2018-01-15 DIAGNOSIS — D696 Thrombocytopenia, unspecified: Secondary | ICD-10-CM | POA: Diagnosis not present

## 2018-01-15 DIAGNOSIS — E876 Hypokalemia: Secondary | ICD-10-CM | POA: Diagnosis present

## 2018-01-15 DIAGNOSIS — I509 Heart failure, unspecified: Secondary | ICD-10-CM | POA: Diagnosis present

## 2018-01-15 DIAGNOSIS — I700 Atherosclerosis of aorta: Secondary | ICD-10-CM | POA: Diagnosis present

## 2018-01-15 DIAGNOSIS — T4275XA Adverse effect of unspecified antiepileptic and sedative-hypnotic drugs, initial encounter: Secondary | ICD-10-CM | POA: Diagnosis not present

## 2018-01-15 DIAGNOSIS — Z8546 Personal history of malignant neoplasm of prostate: Secondary | ICD-10-CM

## 2018-01-15 DIAGNOSIS — R579 Shock, unspecified: Secondary | ICD-10-CM

## 2018-01-15 DIAGNOSIS — J9601 Acute respiratory failure with hypoxia: Secondary | ICD-10-CM | POA: Diagnosis present

## 2018-01-15 DIAGNOSIS — Z66 Do not resuscitate: Secondary | ICD-10-CM | POA: Diagnosis present

## 2018-01-15 DIAGNOSIS — N183 Chronic kidney disease, stage 3 (moderate): Secondary | ICD-10-CM | POA: Diagnosis present

## 2018-01-15 DIAGNOSIS — R000 Tachycardia, unspecified: Secondary | ICD-10-CM | POA: Diagnosis present

## 2018-01-15 DIAGNOSIS — Z515 Encounter for palliative care: Secondary | ICD-10-CM | POA: Diagnosis not present

## 2018-01-15 DIAGNOSIS — Z881 Allergy status to other antibiotic agents status: Secondary | ICD-10-CM

## 2018-01-15 DIAGNOSIS — J9690 Respiratory failure, unspecified, unspecified whether with hypoxia or hypercapnia: Secondary | ICD-10-CM

## 2018-01-15 DIAGNOSIS — J80 Acute respiratory distress syndrome: Secondary | ICD-10-CM | POA: Diagnosis not present

## 2018-01-15 DIAGNOSIS — E039 Hypothyroidism, unspecified: Secondary | ICD-10-CM | POA: Diagnosis present

## 2018-01-15 DIAGNOSIS — R64 Cachexia: Secondary | ICD-10-CM | POA: Diagnosis present

## 2018-01-15 DIAGNOSIS — Z809 Family history of malignant neoplasm, unspecified: Secondary | ICD-10-CM

## 2018-01-15 DIAGNOSIS — K311 Adult hypertrophic pyloric stenosis: Secondary | ICD-10-CM | POA: Diagnosis present

## 2018-01-15 LAB — CBC WITH DIFFERENTIAL/PLATELET
BASOS ABS: 0 10*3/uL (ref 0.0–0.1)
BASOS PCT: 0 %
EOS PCT: 0 %
Eosinophils Absolute: 0 10*3/uL (ref 0.0–0.7)
HCT: 39.8 % (ref 39.0–52.0)
Hemoglobin: 13.1 g/dL (ref 13.0–17.0)
Lymphocytes Relative: 4 %
Lymphs Abs: 0.6 10*3/uL — ABNORMAL LOW (ref 0.7–4.0)
MCH: 31.3 pg (ref 26.0–34.0)
MCHC: 32.9 g/dL (ref 30.0–36.0)
MCV: 95 fL (ref 78.0–100.0)
MONO ABS: 1.1 10*3/uL — AB (ref 0.1–1.0)
MONOS PCT: 7 %
Neutro Abs: 13.4 10*3/uL — ABNORMAL HIGH (ref 1.7–7.7)
Neutrophils Relative %: 89 %
PLATELETS: 320 10*3/uL (ref 150–400)
RBC: 4.19 MIL/uL — ABNORMAL LOW (ref 4.22–5.81)
RDW: 13.2 % (ref 11.5–15.5)
WBC: 15.1 10*3/uL — ABNORMAL HIGH (ref 4.0–10.5)

## 2018-01-15 NOTE — ED Provider Notes (Signed)
[facility] EMERGENCY DEPARTMENT Provider Note   CSN: [id number] Arrival date & time: 01/15/18  2204     History   Chief Complaint Chief Complaint  Patient presents with  . Shortness of Breath    HPI CODIE KROGH is a 84 y.o. male.  This patient is an 84 year old male with past medical history of atrial fibrillation on Eliquis, coronary artery disease with CABG in 2015, GERD with large hiatal hernia, hypertension, and chronic renal insufficiency.  He presents today for evaluation of shortness of breath.  He began vomiting several days ago and was actually hospitalized for 2 days.  He was given medications and fluids and ultimately discharged.  Since returning home, his vomiting has continued and he is now experiencing difficulty breathing.  He denies any chest pain.  He denies fevers, chills, or productive cough.  He denies any swelling in his legs.  The history is provided by the patient.  Shortness of Breath  This is a new problem. The average episode lasts 2 days. The problem occurs continuously.The problem has been rapidly worsening. Pertinent negatives include no fever, no cough, no sputum production and no chest pain. It is unknown what precipitated the problem. He has tried nothing for the symptoms. The treatment provided no relief.    Past Medical History:  Diagnosis Date  . Arthritis   . Atrial fibrillation and flutter ([place])   . Choledocholithiasis   . CKD (chronic kidney disease) 08/2013   stage 3  . Coronary artery disease    CABG x 4 09/03/13 with LIMA to LAD, SVG to diag, SVG sequential to OM 1 and OM2  . Dysrhythmia    atrial fib  . GERD (gastroesophageal reflux disease)    Large HH with intrathoracic component.   . H/O hiatal hernia   . Hyperlipemia   . Hypertension   . Hypothyroidism   . Kidney disease, chronic, stage III (GFR 30-59 ml/min) ([place]) 04/11/2017  . Pneumonia ~ 1935  . Prostate cancer [facility])     Patient Active Problem List   Diagnosis Date Noted    . Atypical chest pain 01/12/2018  . GERD (gastroesophageal reflux disease) 01/12/2018  . Atrial fibrillation and flutter ([place]) 01/12/2018  . Coronary artery disease 01/12/2018  . Senile purpura ([place]) 01/02/2018  . Kidney disease, chronic, stage III (GFR 30-59 ml/min) ([place]) 04/11/2017  . Essential hypertension 04/11/2017  . S/P left inguinal hernia repair 10/02/2016  . Neuropathy 05/25/2016  . Aortic atherosclerosis ([place]) 05/25/2016  . Primary osteoarthritis of left knee 09/20/2015  . Anemia, chronic renal failure 04/15/2015  . Hypokalemia 04/15/2015  . Edema 04/12/2015  . Hyperbilirubinemia   . Choledocholithiasis 04/06/2015  . Hypoxia   . Abdominal pain, epigastric   . Abnormal liver enzymes   . Abnormal LFTs   . Biliary sepsis   . Acute respiratory failure ([place])   . Large Hiatal hernia   . Incisional hernia, without obstruction or gangrene 08/17/2014  . Atrial flutter ([place]) 07/28/2014  . Palpitation 06/16/2014  . Right knee DJD 03/11/2014  . Hx of CABG 09/03/2013  . Insomnia 07/27/2013  . Testosterone deficiency 07/27/2013  . Other hyperlipidemia 06/10/2013  . Heart murmur 06/10/2013  . Dyspnea 06/10/2013  . Inguinal hernia, left. 04/04/2012  . Paraesophageal hiatal hernia 03/24/2012  . Prostate cancer ([place]) 01/15/2012  . Special screening for malignant neoplasms, colon 01/15/2012    Past Surgical History:  Procedure Laterality Date  . CATARACT EXTRACTION Bilateral   . COLONOSCOPY  2003, 01/2012  non-bleeding cecal AVMs, descending and sigmoid tics.   . CORONARY ARTERY BYPASS GRAFT N/A 09/03/2013   Procedure: CORONARY ARTERY BYPASS GRAFTING times four using left internal mammary and bilateral saphenous vein.;  Surgeon: Ivin Poot, MD;  Location: [place];  Service: Open Heart Surgery;  Laterality: N/A;  . ERCP N/A 04/04/2015   Procedure: ENDOSCOPIC RETROGRADE CHOLANGIOPANCREATOGRAPHY (ERCP);  Surgeon: Inda Castle, MD;  Location: [place];  Service:  Endoscopy;  Laterality: N/A;  . ERCP N/A 04/06/2015   Procedure: ENDOSCOPIC RETROGRADE CHOLANGIOPANCREATOGRAPHY (ERCP);  Surgeon: Inda Castle, MD;  Location: [facility];  Service: Endoscopy;  Laterality: N/A;  . ESOPHAGOGASTRODUODENOSCOPY  01/2012   11 cm sliding hiatal hernia.  [facility] EYE SURGERY     cataracts removed, ? IOL  . HERNIA REPAIR     abdominal?  . INGUINAL HERNIA REPAIR Left 10/02/2016  . INGUINAL HERNIA REPAIR Left 10/02/2016   Procedure: REPAIR LEFT INGUINAL HERNIA WITH MESH;  Surgeon: Georganna Skeans, MD;  Location: [place];  Service: General;  Laterality: Left;  . INSERTION OF MESH Left 10/02/2016   Procedure: INSERTION OF MESH;  Surgeon: Georganna Skeans, MD;  Location: [place];  Service: General;  Laterality: Left;  . INTRAOPERATIVE TRANSESOPHAGEAL ECHOCARDIOGRAM N/A 09/03/2013   Procedure: INTRAOPERATIVE TRANSESOPHAGEAL ECHOCARDIOGRAM;  Surgeon: Ivin Poot, MD;  Location: [place];  Service: Open Heart Surgery;  Laterality: N/A;  . LEFT HEART CATHETERIZATION WITH CORONARY ANGIOGRAM N/A 08/03/2013   Procedure: LEFT HEART CATHETERIZATION WITH CORONARY ANGIOGRAM;  Surgeon: Blane Ohara, MD;  Location: [facility] C[facility] LAB;  Service: Cardiovascular;  Laterality: N/A;  . PROSTATE SURGERY    . THORACOTOMY Right As a young child, ? 8 years old   for pneumonia   . TOTAL KNEE ARTHROPLASTY Right 03/11/2014   Procedure: TOTAL KNEE ARTHROPLASTY;  Surgeon: Hessie Dibble, MD;  Location: [place];  Service: Orthopedics;  Laterality: Right;  . TOTAL KNEE ARTHROPLASTY Left 09/20/2015   Procedure: TOTAL KNEE ARTHROPLASTY;  Surgeon: Melrose Nakayama, MD;  Location: [place];  Service: Orthopedics;  Laterality: Left;        Home Medications    Prior to Admission medications   Medication Sig Start Date End Date Taking? Authorizing Provider  acetaminophen (TYLENOL) 325 MG tablet Take 325-650 mg by mouth every 6 (six) hours as needed (for pain.).    [provider]  Ca Carbonate-Mag Hydroxide  (ROLAIDS PO) Take 1-2 tablets by mouth 3 (three) times daily as needed (for heartburn/indigestion).    [provider]  carbamide peroxide (DEBROX) 6.5 % OTIC solution 5 drops 2 (two) times daily. To help clean out ears    [provider]  Cholecalciferol (VITAMIN D3) 1000 UNITS CAPS Take 1,000 Units by mouth every morning.     [provider]  diltiazem (CARDIZEM CD) 240 MG 24 hr capsule Take 1 capsule (240 mg total) by mouth daily. 08/30/17   Chipper Herb, MD  ELIQUIS 2.5 MG TABS tablet TAKE 1 TABLET TWICE DAILY 11/02/17   Chipper Herb, MD  fesoterodine (TOVIAZ) 4 MG TB24 tablet Take 1 tablet (4 mg total) by mouth daily. 08/30/17   Chipper Herb, MD  furosemide (LASIX) 40 MG tablet Take 1 tablet (40 mg total) by mouth daily. 01/15/18   Murlean Iba, MD  levothyroxine (SYNTHROID, LEVOTHROID) 25 MCG tablet Take 1 tablet (25 mcg total) by mouth daily before breakfast. 08/30/17   Chipper Herb, MD  nitroGLYCERIN (NITROSTAT) 0.4 MG SL tablet Place 1  tablet (0.4 mg total) under the tongue every 5 (five) minutes as needed for chest pain. 06/06/16 09/20/17  Minus Breeding, MD  omeprazole (PRILOSEC) 40 MG capsule Take 1 capsule (40 mg total) by mouth daily. 08/30/17   Chipper Herb, MD  pramipexole (MIRAPEX) 1 MG tablet Take 1.5 tablets (1.5 mg total) by mouth at bedtime. 08/30/17   Chipper Herb, MD  pravastatin (PRAVACHOL) 40 MG tablet Take 1 tablet (40 mg total) by mouth daily. 08/30/17   Chipper Herb, MD  traMADol (ULTRAM) 50 MG tablet Take 1 tablet (50 mg total) by mouth every 6 (six) hours as needed for moderate pain. 10/03/16   Georganna Skeans, MD  traZODone (DESYREL) 100 MG tablet Take 1/2 tablet by mouth at bedtime if needed 01/01/18   Chipper Herb, MD    Family History Family History  Problem Relation Age of Onset  . Cancer Mother        bone marrow  . Cancer Father   . Cancer Brother        skin  . Healthy Son   . Colon cancer Neg Hx      Social History Social History   Tobacco Use  . Smoking status: Former Smoker    Packs/day: 1.00    Types: Cigarettes    Last attempt to quit: 08/20/1978    Years since quitting: 39.4  . Smokeless tobacco: Never Used  Substance Use Topics  . Alcohol use: No  . Drug use: No     Allergies   Lipitor [atorvastatin]; Doxycycline; and Penicillins   Review of Systems Review of Systems  Constitutional: Negative for fever.  Respiratory: Positive for shortness of breath. Negative for cough and sputum production.   Cardiovascular: Negative for chest pain.  All other systems reviewed and are negative.    Physical Exam Updated Vital Signs BP (!) 129/96 (BP Location: Left Arm)   Pulse 90   Temp (!) 97.5 F (36.4 C) (Oral)   Resp 20   Wt 64.4 kg (142 lb)   SpO2 95%   BMI 19.80 kg/m   Physical Exam  Constitutional: He is oriented to person, place, and time. He appears well-developed and well-nourished. No distress.  HENT:  Head: Normocephalic and atraumatic.  Mouth/Throat: Oropharynx is clear and moist.  Neck: Normal range of motion. Neck supple.  Cardiovascular: Normal rate and regular rhythm. Exam reveals no friction rub.  No murmur heard. Pulmonary/Chest: Accessory muscle usage present. No stridor. Tachypnea noted. He is in respiratory distress. He has no wheezes. He has rales.  There are rales throughout.  He is in mild to moderate respiratory distress.  Abdominal: Soft. Bowel sounds are normal. He exhibits no distension. There is no tenderness.  Musculoskeletal: Normal range of motion. He exhibits no edema.       Right lower leg: Normal. He exhibits no tenderness and no edema.       Left lower leg: Normal. He exhibits no tenderness and no edema.  Neurological: He is alert and oriented to person, place, and time. Coordination normal.  Skin: Skin is warm and dry. He is not diaphoretic.  Nursing note and vitals reviewed.    ED Treatments / Results  Labs (all labs  ordered are listed, but only abnormal results are displayed) Labs Reviewed  COMPREHENSIVE METABOLIC PANEL  CBC WITH DIFFERENTIAL/PLATELET  TROPONIN I  BRAIN NATRIURETIC PEPTIDE    EKG EKG Interpretation  Date/Time:  Wednesday Jan 15 2018 23:00:29 EDT Ventricular Rate:  119  PR Interval:    QRS Duration: 90 QT Interval:  310 QTC Calculation: 437 R Axis:   -59 Text Interpretation:  Sinus tachycardia Probable left atrial enlargement Abnormal R-wave progression, late transition LVH with secondary repolarization abnormality Confirmed by Veryl Speak [phone]) on 01/15/2018 11:11:55 PM   Radiology No results found.  Procedures Procedures (including critical care time)  Medications Ordered in ED Medications - No data to display   Initial Impression / Assessment and Plan / ED Course  I have reviewed the triage vital signs and the nursing notes.  Pertinent labs & imaging results that were available during my care of the patient were reviewed by me and considered in my medical decision making (see chart for details).  Patient is an 84 year old male presenting with vomiting and shortness of breath.  He was recently admitted for vomiting.  He has a history of a large hiatal hernia that now appears to be bleeding.  He is having some coffee-ground emesis upon presentation.  He was also found to be in moderate respiratory distress.  His lungs have rales and coarse sounds throughout.  My initial impression was that of flash pulmonary edema.  He was hypoxic, tachycardic, and significantly hypertensive.  Chest x-ray was not consistent with this and was more suggestive of an infiltrate.  This also raised the concern of aspiration. He was given intravenous antibiotics and blood cultures were obtained.  His respiratory status continued to decline while in the emergency department to the point he required intubation.  RSI was performed using etomidate and succinylcholine and a 7.5 endotracheal tube  was easily placed using the glide scope.  Tube placement was confirmed with direct visualization, end-tidal CO2, and auscultation over the chest and abdomen.  During the intubation, the patient did have reflux of a significant quantity of black liquid vomitus.  This was promptly suctioned prior to securing the airway.  An OG tube was placed and greater than 1000 cc of black, liquid material was suctioned into the [place] canister.  In the moments following intubation, the patient became hypotensive and aggressive fluid boluses were administered.  He was also given 2 units of packed red cells, then was also started on a phenylephrine drip for support of his blood pressure.  Care was discussed with Dr. Oletta Darter from critical care who agrees to accept the patient in transfer.  CRITICAL CARE Performed by: Veryl Speak Total critical care time: 75 minutes Critical care time was exclusive of separately billable procedures and treating other patients. Critical care was necessary to treat or prevent imminent or life-threatening deterioration. Critical care was time spent personally by me on the following activities: development of treatment plan with patient and/or surrogate as well as nursing, discussions with consultants, evaluation of patient's response to treatment, examination of patient, obtaining history from patient or surrogate, ordering and performing treatments and interventions, ordering and review of laboratory studies, ordering and review of radiographic studies, pulse oximetry and re-evaluation of patient's condition.   Final Clinical Impressions(s) / ED Diagnoses   Final diagnoses:  None    ED Discharge Orders    None       Veryl Speak, MD 01/16/18 [phone]

## 2018-01-15 NOTE — ED Triage Notes (Signed)
Patient has been vomiting since last night and became short of breath since Saturday.  History of aspiration per patient's family.  Coarse  breath sounds heard throughout.

## 2018-01-16 ENCOUNTER — Emergency Department (HOSPITAL_COMMUNITY): Payer: Medicare HMO

## 2018-01-16 ENCOUNTER — Inpatient Hospital Stay (HOSPITAL_COMMUNITY): Payer: Medicare HMO

## 2018-01-16 DIAGNOSIS — K440 Diaphragmatic hernia with obstruction, without gangrene: Secondary | ICD-10-CM | POA: Diagnosis present

## 2018-01-16 DIAGNOSIS — A419 Sepsis, unspecified organism: Secondary | ICD-10-CM | POA: Diagnosis present

## 2018-01-16 DIAGNOSIS — I130 Hypertensive heart and chronic kidney disease with heart failure and stage 1 through stage 4 chronic kidney disease, or unspecified chronic kidney disease: Secondary | ICD-10-CM | POA: Diagnosis present

## 2018-01-16 DIAGNOSIS — J9601 Acute respiratory failure with hypoxia: Secondary | ICD-10-CM | POA: Diagnosis present

## 2018-01-16 DIAGNOSIS — K449 Diaphragmatic hernia without obstruction or gangrene: Secondary | ICD-10-CM | POA: Diagnosis not present

## 2018-01-16 DIAGNOSIS — I248 Other forms of acute ischemic heart disease: Secondary | ICD-10-CM | POA: Diagnosis present

## 2018-01-16 DIAGNOSIS — K209 Esophagitis, unspecified: Secondary | ICD-10-CM | POA: Diagnosis not present

## 2018-01-16 DIAGNOSIS — N183 Chronic kidney disease, stage 3 (moderate): Secondary | ICD-10-CM | POA: Diagnosis present

## 2018-01-16 DIAGNOSIS — J9602 Acute respiratory failure with hypercapnia: Secondary | ICD-10-CM | POA: Diagnosis not present

## 2018-01-16 DIAGNOSIS — I2510 Atherosclerotic heart disease of native coronary artery without angina pectoris: Secondary | ICD-10-CM | POA: Diagnosis present

## 2018-01-16 DIAGNOSIS — J80 Acute respiratory distress syndrome: Secondary | ICD-10-CM | POA: Diagnosis not present

## 2018-01-16 DIAGNOSIS — K311 Adult hypertrophic pyloric stenosis: Secondary | ICD-10-CM | POA: Diagnosis present

## 2018-01-16 DIAGNOSIS — E785 Hyperlipidemia, unspecified: Secondary | ICD-10-CM | POA: Diagnosis present

## 2018-01-16 DIAGNOSIS — E870 Hyperosmolality and hypernatremia: Secondary | ICD-10-CM | POA: Diagnosis not present

## 2018-01-16 DIAGNOSIS — R64 Cachexia: Secondary | ICD-10-CM | POA: Diagnosis present

## 2018-01-16 DIAGNOSIS — K2901 Acute gastritis with bleeding: Secondary | ICD-10-CM | POA: Diagnosis not present

## 2018-01-16 DIAGNOSIS — Z515 Encounter for palliative care: Secondary | ICD-10-CM | POA: Diagnosis not present

## 2018-01-16 DIAGNOSIS — Z9911 Dependence on respirator [ventilator] status: Secondary | ICD-10-CM | POA: Diagnosis not present

## 2018-01-16 DIAGNOSIS — Z66 Do not resuscitate: Secondary | ICD-10-CM | POA: Diagnosis present

## 2018-01-16 DIAGNOSIS — J690 Pneumonitis due to inhalation of food and vomit: Secondary | ICD-10-CM | POA: Diagnosis present

## 2018-01-16 DIAGNOSIS — N179 Acute kidney failure, unspecified: Secondary | ICD-10-CM

## 2018-01-16 DIAGNOSIS — K210 Gastro-esophageal reflux disease with esophagitis: Secondary | ICD-10-CM | POA: Diagnosis not present

## 2018-01-16 DIAGNOSIS — K922 Gastrointestinal hemorrhage, unspecified: Secondary | ICD-10-CM | POA: Diagnosis not present

## 2018-01-16 DIAGNOSIS — R6521 Severe sepsis with septic shock: Secondary | ICD-10-CM

## 2018-01-16 DIAGNOSIS — J810 Acute pulmonary edema: Secondary | ICD-10-CM | POA: Diagnosis not present

## 2018-01-16 DIAGNOSIS — N170 Acute kidney failure with tubular necrosis: Secondary | ICD-10-CM | POA: Diagnosis present

## 2018-01-16 DIAGNOSIS — K2951 Unspecified chronic gastritis with bleeding: Secondary | ICD-10-CM | POA: Diagnosis present

## 2018-01-16 DIAGNOSIS — D62 Acute posthemorrhagic anemia: Secondary | ICD-10-CM | POA: Diagnosis present

## 2018-01-16 DIAGNOSIS — R935 Abnormal findings on diagnostic imaging of other abdominal regions, including retroperitoneum: Secondary | ICD-10-CM | POA: Diagnosis not present

## 2018-01-16 DIAGNOSIS — Z681 Body mass index (BMI) 19 or less, adult: Secondary | ICD-10-CM | POA: Diagnosis not present

## 2018-01-16 DIAGNOSIS — I509 Heart failure, unspecified: Secondary | ICD-10-CM | POA: Diagnosis present

## 2018-01-16 DIAGNOSIS — K920 Hematemesis: Secondary | ICD-10-CM | POA: Diagnosis present

## 2018-01-16 LAB — TROPONIN I: Troponin I: 0.27 ng/mL (ref <0.03)

## 2018-01-16 LAB — BLOOD GAS, ARTERIAL
ACID-BASE EXCESS: 2.1 mmol/L — AB (ref 0.0–2.0)
Acid-base deficit: 2 mmol/L (ref 0.0–2.0)
Acid-base deficit: 3.4 mmol/L — ABNORMAL HIGH (ref 0.0–2.0)
BICARBONATE: 26.1 mmol/L (ref 20.0–28.0)
Bicarbonate: 22.2 mmol/L (ref 20.0–28.0)
Bicarbonate: 20.9 mmol/L (ref 20.0–28.0)
Drawn by: 382351
Drawn by: 382351
Drawn by: 398991
FIO2: 100
FIO2: 50
FIO2: 60
O2 Saturation: 86.6 %
O2 Saturation: 89.1 %
O2 Saturation: 95.4 %
PATIENT TEMPERATURE: 37
PCO2 ART: 36.4 mmHg (ref 32.0–48.0)
PEEP: 6 cmH2O
PEEP: 8 cmH2O
PO2 ART: 62 mmHg — AB (ref 83.0–108.0)
PO2 ART: 98.1 mmHg (ref 83.0–108.0)
Patient temperature: 37
Patient temperature: 98.6
RATE: 18 resp/min
RATE: 20 resp/min
VT: 600 mL
VT: 600 mL
pCO2 arterial: 40.2 mmHg (ref 32.0–48.0)
pCO2 arterial: 49.5 mmHg — ABNORMAL HIGH (ref 32.0–48.0)
pH, Arterial: 7.298 — ABNORMAL LOW (ref 7.350–7.450)
pH, Arterial: 7.377 (ref 7.350–7.450)
pH, Arterial: 7.428 (ref 7.350–7.450)
pO2, Arterial: 56.3 mmHg — ABNORMAL LOW (ref 83.0–108.0)

## 2018-01-16 LAB — PREPARE RBC (CROSSMATCH)

## 2018-01-16 LAB — COMPREHENSIVE METABOLIC PANEL
ALBUMIN: 3.6 g/dL (ref 3.5–5.0)
ALT: 24 U/L (ref 17–63)
AST: 35 U/L (ref 15–41)
Alkaline Phosphatase: 72 U/L (ref 38–126)
Anion gap: 18 — ABNORMAL HIGH (ref 5–15)
BUN: 53 mg/dL — ABNORMAL HIGH (ref 6–20)
CHLORIDE: 98 mmol/L — AB (ref 101–111)
CO2: 26 mmol/L (ref 22–32)
CREATININE: 1.95 mg/dL — AB (ref 0.61–1.24)
Calcium: 10.3 mg/dL (ref 8.9–10.3)
GFR calc Af Amer: 34 mL/min — ABNORMAL LOW (ref >60)
GFR calc non Af Amer: 29 mL/min — ABNORMAL LOW (ref >60)
Glucose, Bld: 116 mg/dL — ABNORMAL HIGH (ref 65–99)
Potassium: 3.5 mmol/L (ref 3.5–5.1)
SODIUM: 142 mmol/L (ref 135–145)
Total Bilirubin: 1 mg/dL (ref 0.3–1.2)
Total Protein: 7.2 g/dL (ref 6.5–8.1)

## 2018-01-16 LAB — POCT I-STAT 3, VENOUS BLOOD GAS (G3P V)
Acid-base deficit: 2 mmol/L (ref 0.0–2.0)
Bicarbonate: 24 mmol/L (ref 20.0–28.0)
O2 Saturation: 73 %
PH VEN: 7.326 (ref 7.250–7.430)
TCO2: 25 mmol/L (ref 22–32)
pCO2, Ven: 46.5 mmHg (ref 44.0–60.0)
pO2, Ven: 45 mmHg (ref 32.0–45.0)

## 2018-01-16 LAB — GLUCOSE, CAPILLARY
GLUCOSE-CAPILLARY: 54 mg/dL — AB (ref 65–99)
GLUCOSE-CAPILLARY: 99 mg/dL (ref 65–99)
GLUCOSE-CAPILLARY: 72 mg/dL (ref 65–99)
Glucose-Capillary: 84 mg/dL (ref 65–99)
Glucose-Capillary: 96 mg/dL (ref 65–99)

## 2018-01-16 LAB — BRAIN NATRIURETIC PEPTIDE: B Natriuretic Peptide: 248 pg/mL — ABNORMAL HIGH (ref 0.0–100.0)

## 2018-01-16 LAB — CBC WITH DIFFERENTIAL/PLATELET
BASOS PCT: 0 %
Basophils Absolute: 0 10*3/uL (ref 0.0–0.1)
EOS PCT: 0 %
Eosinophils Absolute: 0 10*3/uL (ref 0.0–0.7)
HEMATOCRIT: 42 % (ref 39.0–52.0)
Hemoglobin: 13.7 g/dL (ref 13.0–17.0)
LYMPHS PCT: 4 %
Lymphs Abs: 0.2 10*3/uL — ABNORMAL LOW (ref 0.7–4.0)
MCH: 30.1 pg (ref 26.0–34.0)
MCHC: 32.6 g/dL (ref 30.0–36.0)
MCV: 92.3 fL (ref 78.0–100.0)
Monocytes Absolute: 0.5 10*3/uL (ref 0.1–1.0)
Monocytes Relative: 9 %
NEUTROS ABS: 4.6 10*3/uL (ref 1.7–7.7)
NEUTROS PCT: 87 %
Platelets: 281 10*3/uL (ref 150–400)
RBC: 4.55 MIL/uL (ref 4.22–5.81)
RDW: 14.3 % (ref 11.5–15.5)
WBC MORPHOLOGY: INCREASED
WBC: 5.3 10*3/uL (ref 4.0–10.5)

## 2018-01-16 LAB — STREP PNEUMONIAE URINARY ANTIGEN: Strep Pneumo Urinary Antigen: NEGATIVE

## 2018-01-16 LAB — I-STAT CG4 LACTIC ACID, ED
Lactic Acid, Venous: 1.85 mmol/L (ref 0.5–1.9)
Lactic Acid, Venous: 4.24 mmol/L (ref 0.5–1.9)

## 2018-01-16 LAB — MRSA PCR SCREENING: MRSA BY PCR: NEGATIVE

## 2018-01-16 MED ORDER — SODIUM CHLORIDE 0.9 % IV SOLN: 10.0000 mL/h | Freq: Once | INTRAVENOUS | Status: DC

## 2018-01-16 MED ORDER — FENTANYL CITRATE (PF) 100 MCG/2ML IJ SOLN
INTRAMUSCULAR | Status: AC
  Administered 2018-01-16: 50 ug via INTRAVENOUS
  Filled 2018-01-16: qty 2

## 2018-01-16 MED ORDER — DEXTROSE 50 % IV SOLN
INTRAVENOUS | Status: AC
  Administered 2018-01-16: 25 mL
  Filled 2018-01-16: qty 50

## 2018-01-16 MED ORDER — MIDAZOLAM HCL 2 MG/2ML IJ SOLN
2.0000 mg | Freq: Once | INTRAMUSCULAR | Status: AC
  Administered 2018-01-16: 2 mg via INTRAVENOUS

## 2018-01-16 MED ORDER — SODIUM CHLORIDE 0.9 % IV SOLN
2.0000 g | INTRAVENOUS | Status: DC
  Filled 2018-01-16: qty 2

## 2018-01-16 MED ORDER — FENTANYL CITRATE (PF) 100 MCG/2ML IJ SOLN
50.0000 ug | INTRAMUSCULAR | Status: DC | PRN
  Administered 2018-01-16 – 2018-01-17 (×2): 50 ug via INTRAVENOUS

## 2018-01-16 MED ORDER — SODIUM CHLORIDE 0.9 % IV SOLN: 250.0000 mL | INTRAVENOUS | Status: DC | PRN

## 2018-01-16 MED ORDER — SODIUM CHLORIDE 0.9 % IV SOLN
8.0000 mg/h | INTRAVENOUS | Status: AC
  Administered 2018-01-16 – 2018-01-18 (×7): 8 mg/h via INTRAVENOUS
  Filled 2018-01-16 (×11): qty 80

## 2018-01-16 MED ORDER — SUCCINYLCHOLINE CHLORIDE 20 MG/ML IJ SOLN
150.0000 mg | Freq: Once | INTRAMUSCULAR | Status: AC
  Administered 2018-01-16: 150 mg via INTRAVENOUS

## 2018-01-16 MED ORDER — SODIUM CHLORIDE 0.9 % IV BOLUS
500.0000 mL | Freq: Once | INTRAVENOUS | Status: AC
  Administered 2018-01-16: 500 mL via INTRAVENOUS

## 2018-01-16 MED ORDER — MIDAZOLAM HCL 2 MG/2ML IJ SOLN
INTRAMUSCULAR | Status: AC
  Administered 2018-01-16: 05:00:00
  Filled 2018-01-16: qty 2

## 2018-01-16 MED ORDER — SODIUM CHLORIDE 0.9 % IV BOLUS (SEPSIS)
1000.0000 mL | Freq: Once | INTRAVENOUS | Status: AC
  Administered 2018-01-16: 1000 mL via INTRAVENOUS

## 2018-01-16 MED ORDER — DILTIAZEM LOAD VIA INFUSION
10.0000 mg | Freq: Once | INTRAVENOUS | Status: AC
  Administered 2018-01-16: 10 mg via INTRAVENOUS
  Filled 2018-01-16: qty 10

## 2018-01-16 MED ORDER — PROPOFOL 1000 MG/100ML IV EMUL
5.0000 ug/kg/min | Freq: Once | INTRAVENOUS | Status: AC
  Administered 2018-01-16: 5 ug/kg/min via INTRAVENOUS

## 2018-01-16 MED ORDER — FENTANYL CITRATE (PF) 100 MCG/2ML IJ SOLN
50.0000 ug | Freq: Once | INTRAMUSCULAR | Status: AC
  Administered 2018-01-16: 50 ug via INTRAVENOUS

## 2018-01-16 MED ORDER — MIDAZOLAM HCL 2 MG/2ML IJ SOLN
INTRAMUSCULAR | Status: AC
  Administered 2018-01-16: 2 mg via INTRAVENOUS
  Filled 2018-01-16: qty 2

## 2018-01-16 MED ORDER — ETOMIDATE 2 MG/ML IV SOLN
20.0000 mg | Freq: Once | INTRAVENOUS | Status: AC
  Administered 2018-01-16: 20 mg via INTRAVENOUS

## 2018-01-16 MED ORDER — ACETAMINOPHEN 650 MG RE SUPP: 650.0000 mg | RECTAL | Status: DC | PRN

## 2018-01-16 MED ORDER — PHENYLEPHRINE HCL-NACL 10-0.9 MG/250ML-% IV SOLN
0.0000 ug/min | INTRAVENOUS | Status: AC
  Administered 2018-01-16: 100 ug/min via INTRAVENOUS
  Administered 2018-01-16: 90 ug/min via INTRAVENOUS
  Administered 2018-01-16: 80 ug/min via INTRAVENOUS
  Administered 2018-01-16 (×3): 100 ug/min via INTRAVENOUS
  Administered 2018-01-16 (×2): 90 ug/min via INTRAVENOUS
  Administered 2018-01-16: 100 ug/min via INTRAVENOUS
  Administered 2018-01-16: 80 ug/min via INTRAVENOUS
  Administered 2018-01-16: 2.5 ug/min via INTRAVENOUS
  Administered 2018-01-17 (×2): 60 ug/min via INTRAVENOUS
  Administered 2018-01-17: 85 ug/min via INTRAVENOUS
  Administered 2018-01-17: 95 ug/min via INTRAVENOUS
  Administered 2018-01-17: 75 ug/min via INTRAVENOUS
  Filled 2018-01-16 (×25): qty 250

## 2018-01-16 MED ORDER — METRONIDAZOLE IN NACL 5-0.79 MG/ML-% IV SOLN
500.0000 mg | Freq: Three times a day (TID) | INTRAVENOUS | Status: DC
  Administered 2018-01-16 – 2018-01-19 (×9): 500 mg via INTRAVENOUS
  Filled 2018-01-16 (×10): qty 100

## 2018-01-16 MED ORDER — PROPOFOL 1000 MG/100ML IV EMUL
INTRAVENOUS | Status: AC
  Filled 2018-01-16: qty 100

## 2018-01-16 MED ORDER — SODIUM CHLORIDE 0.9 % IV SOLN
1.0000 g | Freq: Two times a day (BID) | INTRAVENOUS | Status: DC
  Administered 2018-01-16 (×2): 1 g via INTRAVENOUS
  Filled 2018-01-16 (×3): qty 1

## 2018-01-16 MED ORDER — MIDAZOLAM HCL 2 MG/2ML IJ SOLN
INTRAMUSCULAR | Status: AC
  Administered 2018-01-16: 04:00:00
  Filled 2018-01-16: qty 2

## 2018-01-16 MED ORDER — VANCOMYCIN HCL IN DEXTROSE 750-5 MG/150ML-% IV SOLN
750.0000 mg | INTRAVENOUS | Status: DC
  Administered 2018-01-16: 750 mg via INTRAVENOUS
  Filled 2018-01-16: qty 150

## 2018-01-16 MED ORDER — IPRATROPIUM-ALBUTEROL 0.5-2.5 (3) MG/3ML IN SOLN
3.0000 mL | Freq: Four times a day (QID) | RESPIRATORY_TRACT | Status: DC
  Administered 2018-01-16 – 2018-01-24 (×33): 3 mL via RESPIRATORY_TRACT
  Filled 2018-01-16 (×32): qty 3

## 2018-01-16 MED ORDER — PANTOPRAZOLE SODIUM 40 MG IV SOLR
INTRAVENOUS | Status: AC
  Filled 2018-01-16: qty 160

## 2018-01-16 MED ORDER — METRONIDAZOLE IN NACL 5-0.79 MG/ML-% IV SOLN
500.0000 mg | Freq: Once | INTRAVENOUS | Status: AC
  Administered 2018-01-16: 500 mg via INTRAVENOUS
  Filled 2018-01-16: qty 100

## 2018-01-16 MED ORDER — SODIUM CHLORIDE 0.9 % IV SOLN
2.0000 g | Freq: Once | INTRAVENOUS | Status: AC
  Administered 2018-01-16: 2 g via INTRAVENOUS
  Filled 2018-01-16: qty 2

## 2018-01-16 MED ORDER — DILTIAZEM HCL-DEXTROSE 100-5 MG/100ML-% IV SOLN (PREMIX)
5.0000 mg/h | INTRAVENOUS | Status: DC
  Administered 2018-01-16: 5 mg/h via INTRAVENOUS
  Administered 2018-01-16: 10 mg/h via INTRAVENOUS
  Filled 2018-01-16: qty 100

## 2018-01-16 MED ORDER — FENTANYL CITRATE (PF) 100 MCG/2ML IJ SOLN
50.0000 ug | INTRAMUSCULAR | Status: DC | PRN
  Administered 2018-01-17 – 2018-01-22 (×7): 50 ug via INTRAVENOUS
  Filled 2018-01-16 (×10): qty 2

## 2018-01-16 MED ORDER — DEXTROSE 50 % IV SOLN
INTRAVENOUS | Status: AC
  Administered 2018-01-16: 25 g
  Filled 2018-01-16: qty 50

## 2018-01-16 MED ORDER — MIDAZOLAM HCL 2 MG/2ML IJ SOLN
2.0000 mg | Freq: Once | INTRAMUSCULAR | Status: AC
  Administered 2018-01-16 (×2): 2 mg via INTRAVENOUS
  Filled 2018-01-16: qty 2

## 2018-01-16 MED ORDER — PANTOPRAZOLE SODIUM 40 MG IV SOLR
40.0000 mg | Freq: Two times a day (BID) | INTRAVENOUS | Status: DC
  Administered 2018-01-19 – 2018-01-24 (×11): 40 mg via INTRAVENOUS
  Filled 2018-01-16 (×13): qty 40

## 2018-01-16 MED ORDER — MIDAZOLAM HCL 2 MG/2ML IJ SOLN
2.0000 mg | INTRAMUSCULAR | Status: DC | PRN
  Administered 2018-01-16: 2 mg via INTRAVENOUS
  Filled 2018-01-16 (×2): qty 2

## 2018-01-16 MED ORDER — SODIUM CHLORIDE 0.9 % IV SOLN
INTRAVENOUS | Status: DC
  Administered 2018-01-16 – 2018-01-17 (×2): via INTRAVENOUS

## 2018-01-16 MED ORDER — PANTOPRAZOLE SODIUM 40 MG IV SOLR
80.0000 mg | Freq: Once | INTRAVENOUS | Status: AC
  Administered 2018-01-16: 80 mg via INTRAVENOUS
  Filled 2018-01-16: qty 80

## 2018-01-16 MED ORDER — PROPOFOL 1000 MG/100ML IV EMUL
0.0000 ug/kg/min | INTRAVENOUS | Status: DC
  Administered 2018-01-16: 15 ug/kg/min via INTRAVENOUS
  Administered 2018-01-17: 30 ug/kg/min via INTRAVENOUS
  Administered 2018-01-18: 10 ug/kg/min via INTRAVENOUS
  Filled 2018-01-16 (×6): qty 100

## 2018-01-16 MED ORDER — VANCOMYCIN HCL IN DEXTROSE 1-5 GM/200ML-% IV SOLN
1000.0000 mg | Freq: Once | INTRAVENOUS | Status: AC
  Administered 2018-01-16: 1000 mg via INTRAVENOUS
  Filled 2018-01-16: qty 200

## 2018-01-16 NOTE — ED Notes (Addendum)
CRITICAL VALUE ALERT  Critical Value:  Lactic 4.24  Date & Time Notied:  01/16/18 0018  Provider Notified: [place] EDP  Orders Received/Actions taken: [address] [place]

## 2018-01-16 NOTE — Progress Notes (Signed)
RT note- fio2 increased post ABG results.

## 2018-01-16 NOTE — Progress Notes (Signed)
Attempted insertion of right IJ CVC.  Able to access vessel, but unable to thread guidewire without resistance.  Suspect right IJ has some degree of obstruction/stenosis.  Pt tolerated procedure well, no change in vital signs.  CXR pending.

## 2018-01-16 NOTE — Progress Notes (Signed)
ANTIBIOTIC CONSULT NOTE-Preliminary  Pharmacy Consult for vancomycin Indication: pneumonia  Allergies  Allergen Reactions  . Lipitor [Atorvastatin] Other (See Comments)    "feel bad"  . Doxycycline Rash    Redness to face and hands  . Penicillins Itching    Has patient had a PCN reaction causing immediate rash, facial/tongue/throat swelling, SOB or lightheadedness with hypotension: Yes Has patient had a PCN reaction causing severe rash involving mucus membranes or skin necrosis: No Has patient had a PCN reaction that required hospitalization No Has patient had a PCN reaction occurring within the last 10 years: No If all of the above answers are "NO", then may proceed with Cephalosporin use.     Patient Measurements: Height: 5\' 11"  (180.3 cm) Weight: 142 lb (64.4 kg) IBW/kg (Calculated) : 75.3 Adjusted Body Weight:   Vital Signs: Temp: 98.1 F (36.7 C) (05/29 2339) Temp Source: Oral (05/29 2339) BP: 107/83 (05/29 2339) Pulse Rate: 145 (05/29 2339)  Labs: Recent Labs    01/13/18 0135 01/15/18 2327  WBC 9.3 15.1*  HGB 13.6 13.1  PLT 234 320  CREATININE 1.84* 1.95*    Estimated Creatinine Clearance: 23.9 mL/min (A) (by C-G formula based on SCr of 1.95 mg/dL (H)).  No results for input(s): VANCOTROUGH, VANCOPEAK, VANCORANDOM, GENTTROUGH, GENTPEAK, GENTRANDOM, TOBRATROUGH, TOBRAPEAK, TOBRARND, AMIKACINPEAK, AMIKACINTROU, AMIKACIN in the last 72 hours.   Microbiology: Recent Results (from the past 720 hour(s))  Microscopic Examination     Status: None   Collection Time: 01/02/18  9:46 AM  Result Value Ref Range Status   WBC, UA None seen 0 - 5 /hpf Final   RBC, UA None seen 0 - 2 /hpf Final   Epithelial Cells (non renal) None seen 0 - 10 /hpf Final   Renal Epithel, UA None seen None seen /hpf Final   Bacteria, UA None seen None seen/Few Final    Medical History: Past Medical History:  Diagnosis Date  . Arthritis   . Atrial fibrillation and flutter ([place])   .  Choledocholithiasis   . CKD (chronic kidney disease) 08/2013   stage 3  . Coronary artery disease    CABG x 4 09/03/13 with LIMA to LAD, SVG to diag, SVG sequential to OM 1 and OM2  . Dysrhythmia    atrial fib  . GERD (gastroesophageal reflux disease)    Large HH with intrathoracic component.   . H/O hiatal hernia   . Hyperlipemia   . Hypertension   . Hypothyroidism   . Kidney disease, chronic, stage III (GFR 30-59 ml/min) ([place]) 04/11/2017  . Pneumonia ~ 1935  . Prostate cancer ([place])     Medications:  Infusions:  . aztreonam    . sodium chloride 500 mL (01/16/18 0027)  . vancomycin     Anti-infectives (From admission, onward)   Start     Dose/Rate Route Frequency Ordered Stop   01/16/18 0045  vancomycin (VANCOCIN) IVPB 1000 mg/200 mL premix     1,000 mg 200 mL/hr over 60 Minutes Intravenous  Once 01/16/18 0042     01/16/18 0015  aztreonam (AZACTAM) 2 g in sodium chloride 0.9 % 100 mL IVPB     2 g 200 mL/hr over 30 Minutes Intravenous  Once 01/16/18 0014        Assessment: 84 yo male presented with SOB.  Starting vancomycin for PNA.   Goal of Therapy:  Vancomycin trough level 15-20 mcg/ml  Plan:  Preliminary review of pertinent patient information completed.  Protocol will be initiated with  dose(s) of vancomycin 1 gram.  Forestine Na clinical pharmacist will complete review during morning rounds to assess patient and finalize treatment regimen if needed.  Breawna Montenegro, Magdalene Molly, RPH 01/16/2018,12:43 AM

## 2018-01-16 NOTE — Procedures (Deleted)
Central Venous Catheter Insertion Procedure Note HRIDHAAN YOHN [id number] Mar 28, 1927  Procedure: Insertion of Central Venous Catheter Indications: Assessment of intravascular volume, Drug and/or fluid administration and Frequent blood sampling  Procedure Details Consent: Risks of procedure as well as the alternatives and risks of each were explained to the (patient/caregiver).  Consent for procedure obtained. Time Out: Verified patient identification, verified procedure, site/side was marked, verified correct patient position, special equipment/implants available, medications/allergies/relevent history reviewed, required imaging and test results available.  Performed  Maximum sterile technique was used including antiseptics, cap, gloves, gown, hand hygiene, mask and sheet. Skin prep: Chlorhexidine; local anesthetic administered A antimicrobial bonded/coated triple lumen catheter was placed in the left internal jugular vein using the Seldinger technique. Ultrasound guidance used.Yes.   Catheter placed to 20 cm. Blood aspirated via all 3 ports and then flushed x 3. Line sutured x 2 and dressing applied.  Evaluation Blood flow good Complications: No apparent complications Patient did tolerate procedure well. Chest X-ray ordered to verify placement.  CXR: pending.  Technically very difficult line placement.   Georgann Housekeeper, AGACNP-BC [facility] Pulmonology/Critical Care Pager [phone] or [phone]  01/16/2018 4:54 AM

## 2018-01-16 NOTE — Progress Notes (Signed)
RT note- ABG done with venous results. Dr. Elsworth Soho informed, no re stick required, wean fio2 as tolerated.

## 2018-01-16 NOTE — ED Notes (Signed)
Lab at bedside getting blood cultures.

## 2018-01-16 NOTE — Progress Notes (Signed)
RT note: Sputum sample obtained and sent down to main lab without complications.   

## 2018-01-16 NOTE — H&P (Signed)
PULMONARY / CRITICAL CARE MEDICINE   Name: ROLLY MAGRI MRN: [id number] DOB: 1927/04/10    ADMISSION DATE:  01/15/2018 CONSULTATION DATE:  01/16/2018  REFERRING MD:  Dr. Stark Jock EDP. Forestine Na  CHIEF COMPLAINT:  Respiratory failure  HISTORY OF PRESENT ILLNESS:  Patient is encephalopathic and/or intubated. Therefore history has been obtained from chart review. 84 year old male with PMH as below, which is significant for Afib on eliquis, CAD s/p CABG 2015, and GERD. He was admitted 5/26 to [facility] for complaints felt to be secondary to his large hiatal hernia. He was having nausea/vomiting and atypical chest pain. Symptoms resolved with reflux treatment and he was ultimately discharged to home 5/28. He presented again to the ED 5/29 with complaints of continued vomiting and dyspnea. Upon arrival to the ED he was hypoxemic ultimately requiring intubation. Intubatio was complicated by reflux of black liquid emesis. OG tube was placed and removed > 1000cc black liquid. He became more hypotensive and was given IVF and blood products (2 units PRBC) and eventually required phenylephrine infusion. PCCM accepting transfer for respiratory failure.   PAST MEDICAL HISTORY :  He  has a past medical history of Arthritis, Atrial fibrillation and flutter ([place]), Choledocholithiasis, CKD (chronic kidney disease) (08/2013), Coronary artery disease, Dysrhythmia, GERD (gastroesophageal reflux disease), H/O hiatal hernia, Hyperlipemia, Hypertension, Hypothyroidism, Kidney disease, chronic, stage III (GFR 30-59 ml/min) ([place]) (04/11/2017), Pneumonia (~ 1935), and Prostate cancer ([place]).  PAST SURGICAL HISTORY: He  has a past surgical history that includes Prostate surgery; Colonoscopy (2003, 01/2012); Coronary artery bypass graft (N/A, 09/03/2013); Intraoprative transesophageal echocardiogram (N/A, 09/03/2013); Cataract extraction (Bilateral); Total knee arthroplasty (Right, 03/11/2014); left heart catheterization with  coronary angiogram (N/A, 08/03/2013); Esophagogastroduodenoscopy (01/2012); ERCP (N/A, 04/04/2015); ERCP (N/A, 04/06/2015); Eye surgery; Total knee arthroplasty (Left, 09/20/2015); Thoracotomy (Right, As a young child, ? 59 years old); Hernia repair; Inguinal hernia repair (Left, 10/02/2016); Inguinal hernia repair (Left, 10/02/2016); and Insertion of mesh (Left, 10/02/2016).  Allergies  Allergen Reactions  . Lipitor [Atorvastatin] Other (See Comments)    "feel bad"  . Doxycycline Rash    Redness to face and hands  . Penicillins Itching    Has patient had a PCN reaction causing immediate rash, facial/tongue/throat swelling, SOB or lightheadedness with hypotension: Yes Has patient had a PCN reaction causing severe rash involving mucus membranes or skin necrosis: No Has patient had a PCN reaction that required hospitalization No Has patient had a PCN reaction occurring within the last 10 years: No If all of the above answers are "NO", then may proceed with Cephalosporin use.     No current facility-administered medications on file prior to encounter.    Current Outpatient Medications on File Prior to Encounter  Medication Sig  . acetaminophen (TYLENOL) 325 MG tablet Take 325-650 mg by mouth every 6 (six) hours as needed (for pain.).  . Ca Carbonate-Mag Hydroxide (ROLAIDS PO) Take 1-2 tablets by mouth 3 (three) times daily as needed (for heartburn/indigestion).  . carbamide peroxide (DEBROX) 6.5 % OTIC solution 5 drops 2 (two) times daily. To help clean out ears  . Cholecalciferol (VITAMIN D3) 1000 UNITS CAPS Take 1,000 Units by mouth every morning.   . diltiazem (CARDIZEM CD) 240 MG 24 hr capsule Take 1 capsule (240 mg total) by mouth daily.  Marland Kitchen ELIQUIS 2.5 MG TABS tablet TAKE 1 TABLET TWICE DAILY  . fesoterodine (TOVIAZ) 4 MG TB24 tablet Take 1 tablet (4 mg total) by mouth daily.  . furosemide (LASIX) 40 MG  tablet Take 1 tablet (40 mg total) by mouth daily.  Marland Kitchen levothyroxine (SYNTHROID,  LEVOTHROID) 25 MCG tablet Take 1 tablet (25 mcg total) by mouth daily before breakfast.  . nitroGLYCERIN (NITROSTAT) 0.4 MG SL tablet Place 1 tablet (0.4 mg total) under the tongue every 5 (five) minutes as needed for chest pain.  Marland Kitchen omeprazole (PRILOSEC) 40 MG capsule Take 1 capsule (40 mg total) by mouth daily.  . pramipexole (MIRAPEX) 1 MG tablet Take 1.5 tablets (1.5 mg total) by mouth at bedtime.  . pravastatin (PRAVACHOL) 40 MG tablet Take 1 tablet (40 mg total) by mouth daily.  . traMADol (ULTRAM) 50 MG tablet Take 1 tablet (50 mg total) by mouth every 6 (six) hours as needed for moderate pain.  . traZODone (DESYREL) 100 MG tablet Take 1/2 tablet by mouth at bedtime if needed    FAMILY HISTORY:  His indicated that his mother is deceased. He indicated that his father is deceased. He indicated that his sister is alive. He indicated that his brother is alive. He indicated that his maternal grandmother is deceased. He indicated that his maternal grandfather is deceased. He indicated that his paternal grandmother is deceased. He indicated that his paternal grandfather is deceased. He indicated that his son is alive. He indicated that the status of his neg hx is unknown.   SOCIAL HISTORY: He  reports that he quit smoking about 39 years ago. His smoking use included cigarettes. He smoked 1.00 pack per day. He has never used smokeless tobacco. He reports that he does not drink alcohol or use drugs.  REVIEW OF SYSTEMS:   Unable as patient is encephalopathic and intubated.   SUBJECTIVE:  Admitted to imu 4 0730 hrs. 01/16/2018 on full mechanical ventilatory support pressure support with Neo-Synephrine  VITAL SIGNS: BP (!) 98/53   Pulse 64   Temp (!) 92.5 F (33.6 C)   Resp (!) 28   Ht 5\' 11"  (1.803 m)   Wt 64.4 kg (142 lb)   SpO2 (!) 80%   BMI 19.80 kg/m   HEMODYNAMICS:    VENTILATOR SETTINGS: Vent Mode: PRVC FiO2 (%):  [100 %] 100 % Set Rate:  [18 bmp] 18 bmp Vt Set:  [600 mL] 600  mL PEEP:  [6 cmH20] 6 cmH20 Plateau Pressure:  [26 cmH20] 26 cmH20  INTAKE / OUTPUT: No intake/output data recorded.  PHYSICAL EXAMINATION: General: Frail cachectic elderly male sedated on mechanical ventilatory support Neuro: Moves all extremities spontaneously does not follow commands but is under sedation HEENT: Endotracheal tube to ventilator, NG tube left nares with dark brown thick secretions Cardiovascular: Heart sounds regular regular rate and rhythm Lungs: Rhonchi bilaterally Abdomen: Soft nontender, reported to have vomited thousand cc of dark brown fluid, multiple abdominal scars noted Musculoskeletal: Intact, bilateral knee replacement scars noted Skin: Cool and dry  LABS:  BMET Recent Labs  Lab 01/12/18 0643 01/13/18 0135 01/15/18 2327  NA 141 146* 142  K 3.1* 3.8 3.5  CL 100* 99* 98*  CO2 30 34* 26  BUN 19 27* 53*  CREATININE 1.24 1.84* 1.95*  GLUCOSE 102* 140* 116*    Electrolytes Recent Labs  Lab 01/12/18 0643 01/13/18 0135 01/15/18 2327  CALCIUM 10.0 10.4* 10.3  MG  --  2.0  --     CBC Recent Labs  Lab 01/12/18 0643 01/13/18 0135 01/15/18 2327  WBC 7.7 9.3 15.1*  HGB 12.5* 13.6 13.1  HCT 36.8* 42.3 39.8  PLT 233 234 320    Coag's  No results for input(s): APTT, INR in the last 168 hours.  Sepsis Markers Recent Labs  Lab 01/16/18 0009  LATICACIDVEN 4.24*    ABG Recent Labs  Lab 01/16/18 0030 01/16/18 0250  PHART 7.428 7.298*  PCO2ART 40.2 49.5*  PO2ART 62.0* 98.1    Liver Enzymes Recent Labs  Lab 01/12/18 0643 01/13/18 0135 01/15/18 2327  AST 22 28 35  ALT 15* 17 24  ALKPHOS 84 87 72  BILITOT 0.9 1.0 1.0  ALBUMIN 3.8 3.6 3.6    Cardiac Enzymes Recent Labs  Lab 01/12/18 1917 01/13/18 0135 01/15/18 2327  TROPONINI 0.06* 0.07* 0.27*    Glucose No results for input(s): GLUCAP in the last 168 hours.  Imaging Dg Chest Port 1 View  Result Date: 01/16/2018 CLINICAL DATA:  84 y/o  M; ET tube placement. EXAM:  PORTABLE CHEST 1 VIEW COMPARISON:  01/15/2018 chest radiograph. FINDINGS: Stable cardiomegaly given projection and technique. Enteric tube tip is coiling within a large hiatal hernia. Endotracheal tube tip projects 2.6 cm above the carina. Increased patchy consolidations of the mid and lower lung zones. Multilevel degenerative changes of the spine. No acute osseous abnormality is evident. IMPRESSION: 1. Endotracheal tube tip 2.6 cm above the carina. 2. Enteric tube tip coiling within large hiatal hernia. 3. Increased consolidation within mid and lower lung zones. Electronically Signed   By: Kristine Garbe M.D.   On: 01/16/2018 03:24   Dg Chest Port 1 View  Result Date: 01/15/2018 CLINICAL DATA:  Shortness of breath and vomiting. EXAM: PORTABLE CHEST 1 VIEW COMPARISON:  Jan 12, 2018 FINDINGS: A very large hiatal hernia is identified. The heart, hila, and mediastinum are unchanged. No pneumothorax. No focal infiltrate in the left lung. Mild opacity in the right mid and lower lung. No other acute abnormalities. IMPRESSION: 1. Large hiatal hernia. 2. Cardiomegaly. 3. Mild new opacity in the right mid and lower lung. Electronically Signed   By: Dorise Bullion III M.D   On: 01/15/2018 23:33     STUDIES:  5/26 CT abd > Progressive large hiatal hernia involving stomach and distal transverse colon. 3 areas of relative luminal narrowing in the stomach may result in a degree of relative obstruction and account for the patient's symptoms. Advanced right hip DJD with effusion and iliopsoas bursitis with effusion. Sigmoid diverticulosis.  CULTURES:  12/20/2017 blood cultures x2>> 01/16/2018 sputum culture>>  ANTIBIOTICS: Aztreonam , flagyl, and vancomycin given in ED 5/30  01/16/2018 vancomycin>> 01/16/2018 cefepime>> 12/20/2017 Flagyl>>  SIGNIFICANT EVENTS: 5/26>5/28 admit for chest pain and vomiting felt secondary to hiatal hernia.  5/30 admit to 2104  LINES/TUBES: ETT 5/30 > 01/16/2018 NG  tube>>  DISCUSSION: 84 year old male who has a significant past medical history includes atrial fib, coronary artery disease status post CABG 2016 and gastroesophageal reflux disease he is admitted any pain hospital on 526 with complaints attributed to his large hiatal hernia.  He did have nausea vomiting atypical chest pains with a negative troponin.  He was discharged home and represented to the emergency department 529 of further complaints of vomiting and dyspnea.  He was noted to be hypoxemic most likely aspirating required intubation and it was notable for large amounts of dark fluid in his oropharynx.  He did receive 2 units of packed cells hemoglobin on transfer was  greater than 13.  ASSESSMENT / PLAN:  PULMONARY A: Vent dependent respiratory failure secondary to suspected aspiration during vomiting. Question community-acquired pneumonia History of chronic objective pulmonary disease by pulmonary function test  P:   Placed on full mechanical ventilatory support adjustments made per FiO2 and PEEP needs Bronchodilators Antibiotics  CARDIOVASCULAR A:  History of coronary artery disease status post CABG 2016 Preserved EF of the last 2D echo revealed EF of 65% with grade 1 diastolic dysfunction History of atrial fibrillation on Eliquis P:  Repeat troponins No anticoagulation Twelve-lead EKG Hold Eliquis and all other anticoagulant May need a cardiology consult   RENAL Lab Results  Component Value Date   CREATININE 1.95 (H) 01/15/2018   CREATININE 1.84 (H) 01/13/2018   CREATININE 1.24 01/12/2018   CREATININE 1.33 02/17/2013   Recent Labs  Lab 01/12/18 0643 01/13/18 0135 01/15/18 2327  K 3.1* 3.8 3.5    A:   Acute renal insufficiency P:   Monitor creatinine Avoid nephrotoxins Gentle hydration Serial creatinines  GASTROINTESTINAL A:   Nausea vomiting Presumed GI bleed with dark emesis P:   Protonix drip Consider GI consult N.p.o. NG tube to  drainage  HEMATOLOGIC Recent Labs    01/15/18 2327  HGB 13.1    A:   Presumed GI bleed  P:  Check CBC for completeness note he had 2 units of packed cells at any point hospital 01/16/2018  INFECTIOUS A:   Questionable intra-abdominal infection Presumed community acquired pneumonia P:   Day 1 of vancomycin, Flagyl, cefepime Panculture  ENDOCRINE CBG (last 3)  No results for input(s): GLUCAP in the last 72 hours.   A:   No acute abnormalities P:   Monitor glucose  NEUROLOGIC A:   Currently sedated on ventilator P:   RASS goal: -1 Daily work-up assessment   FAMILY  - Updates: Family at bedside  - Inter-disciplinary family meet or Palliative Care meeting due by:  day 7   Steve Jabriel Vanduyne ACNP Maryanna Shape PCCM Pager [phone] till 1 pm If no answer page [phone] 01/16/2018, 8:31 AM '

## 2018-01-16 NOTE — ED Notes (Signed)
Date and time results received: 01/16/18 12:11 AM  (use smartphrase ".now" to insert current time)  Test: troponin  Critical Value: 0.27  Name of Provider Notified: delo  Orders Received? Or Actions Taken?:

## 2018-01-16 NOTE — Progress Notes (Addendum)
Pharmacy Antibiotic Note  [name] is a 84 y.o. male admitted on 01/15/2018 with pneumonia.    Plan: Cefepime 1 g q12h Flagyl 500 mg q8h Vanc 750 mg q24h Monitor renal fx cx vt prn  Height: 5\' 11"  (180.3 cm) Weight: 142 lb (64.4 kg) IBW/kg (Calculated) : 75.3  Temp (24hrs), Avg:98.6 F (37 C), Min:92.5 F (33.6 C), Max:100.8 F (38.2 C)  Recent Labs  Lab 01/12/18 0643 01/13/18 0135 01/15/18 2327 01/16/18 0009 01/16/18 0355  WBC 7.7 9.3 15.1*  --   --   CREATININE 1.24 1.84* 1.95*  --   --   LATICACIDVEN  --   --   --  4.24* 1.85    Estimated Creatinine Clearance: 23.9 mL/min (A) (by C-G formula based on SCr of 1.95 mg/dL (H)).    Allergies  Allergen Reactions  . Lipitor [Atorvastatin] Other (See Comments)    "feel bad"  . Doxycycline Rash    Redness to face and hands  . Penicillins Itching    Has patient had a PCN reaction causing immediate rash, facial/tongue/throat swelling, SOB or lightheadedness with hypotension: Yes Has patient had a PCN reaction causing severe rash involving mucus membranes or skin necrosis: No Has patient had a PCN reaction that required hospitalization No Has patient had a PCN reaction occurring within the last 10 years: No If all of the above answers are "NO", then may proceed with Cephalosporin use.    Levester Fresh, PharmD, BCPS, BCCCP Clinical Pharmacist Clinical phone for 01/16/2018 from 7a-3:30p: [phone] If after 3:30p, please call main pharmacy at: [phone] 01/16/2018 8:30 AM

## 2018-01-16 NOTE — Telephone Encounter (Signed)
Pt is in the ICU at [facility], will close encounter.

## 2018-01-16 NOTE — Procedures (Signed)
Central Venous Catheter Insertion Procedure Note [name] [id number] 1927/01/22  Procedure: Insertion of Central Venous Catheter Indications: Assessment of intravascular volume, Drug and/or fluid administration and Frequent blood sampling  Procedure Details Consent: Risks of procedure as well as the alternatives and risks of each were explained to the (patient/caregiver).  Consent for procedure obtained. Time Out: Verified patient identification, verified procedure, site/side was marked, verified correct patient position, special equipment/implants available, medications/allergies/relevent history reviewed, required imaging and test results available.  Performed  Maximum sterile technique was used including antiseptics, cap, gloves, gown, hand hygiene, mask and sheet. Skin prep: Chlorhexidine; local anesthetic administered A antimicrobial bonded/coated triple lumen catheter was placed in the left internal jugular vein using the Seldinger technique.  Evaluation Blood flow good Complications: No apparent complications Patient did tolerate procedure well. Chest X-ray ordered to verify placement.  CXR: pending   Central line placed with ultrasound guidance, under direct supervision of Noe Gens NP & Dr. Elsworth Soho.  Note; right IJ previously attempted and unable to pass guidewire.  See previous progress note.  Bradly Bienenstock 01/16/2018, 3:37 PM

## 2018-01-16 NOTE — ED Notes (Signed)
CRITICAL VALUE ALERT  Critical Value: Troponin 0.27  Date & Time Notied:  01/16/18 0005  Provider Notified: Delo EDP  Orders Received/Actions taken: No orders at this time

## 2018-01-17 ENCOUNTER — Inpatient Hospital Stay (HOSPITAL_COMMUNITY): Payer: Medicare HMO

## 2018-01-17 DIAGNOSIS — R935 Abnormal findings on diagnostic imaging of other abdominal regions, including retroperitoneum: Secondary | ICD-10-CM

## 2018-01-17 DIAGNOSIS — K449 Diaphragmatic hernia without obstruction or gangrene: Secondary | ICD-10-CM

## 2018-01-17 DIAGNOSIS — D62 Acute posthemorrhagic anemia: Secondary | ICD-10-CM

## 2018-01-17 DIAGNOSIS — L8990 Pressure ulcer of unspecified site, unspecified stage: Secondary | ICD-10-CM

## 2018-01-17 DIAGNOSIS — K920 Hematemesis: Secondary | ICD-10-CM

## 2018-01-17 DIAGNOSIS — K311 Adult hypertrophic pyloric stenosis: Secondary | ICD-10-CM

## 2018-01-17 DIAGNOSIS — K922 Gastrointestinal hemorrhage, unspecified: Secondary | ICD-10-CM

## 2018-01-17 LAB — CBC WITH DIFFERENTIAL/PLATELET
BASOS ABS: 0 10*3/uL (ref 0.0–0.1)
Band Neutrophils: 51 %
Basophils Relative: 0 %
EOS ABS: 0.1 10*3/uL (ref 0.0–0.7)
EOS PCT: 1 %
HCT: 32.5 % — ABNORMAL LOW (ref 39.0–52.0)
Hemoglobin: 10.6 g/dL — ABNORMAL LOW (ref 13.0–17.0)
LYMPHS ABS: 0.5 10*3/uL — AB (ref 0.7–4.0)
Lymphocytes Relative: 5 %
MCH: 30.7 pg (ref 26.0–34.0)
MCHC: 32.6 g/dL (ref 30.0–36.0)
MCV: 94.2 fL (ref 78.0–100.0)
Monocytes Absolute: 0.5 10*3/uL (ref 0.1–1.0)
Monocytes Relative: 5 %
NEUTROS ABS: 9.1 10*3/uL — AB (ref 1.7–7.7)
NEUTROS PCT: 38 %
PLATELETS: 212 10*3/uL (ref 150–400)
RBC: 3.45 MIL/uL — AB (ref 4.22–5.81)
RDW: 15.2 % (ref 11.5–15.5)
WBC Morphology: INCREASED
WBC: 10.2 10*3/uL (ref 4.0–10.5)

## 2018-01-17 LAB — GLUCOSE, CAPILLARY
GLUCOSE-CAPILLARY: 72 mg/dL (ref 65–99)
GLUCOSE-CAPILLARY: 92 mg/dL (ref 65–99)
Glucose-Capillary: 72 mg/dL (ref 65–99)
Glucose-Capillary: 85 mg/dL (ref 65–99)
Glucose-Capillary: 85 mg/dL (ref 65–99)
Glucose-Capillary: 97 mg/dL (ref 65–99)

## 2018-01-17 LAB — TYPE AND SCREEN
ABO/RH(D): AB NEG
ANTIBODY SCREEN: NEGATIVE
UNIT DIVISION: 0
Unit division: 0

## 2018-01-17 LAB — BASIC METABOLIC PANEL
Anion gap: 11 (ref 5–15)
BUN: 47 mg/dL — AB (ref 6–20)
CHLORIDE: 112 mmol/L — AB (ref 101–111)
CO2: 21 mmol/L — ABNORMAL LOW (ref 22–32)
CREATININE: 2.05 mg/dL — AB (ref 0.61–1.24)
Calcium: 7.7 mg/dL — ABNORMAL LOW (ref 8.9–10.3)
GFR, EST AFRICAN AMERICAN: 32 mL/min — AB (ref >60)
GFR, EST NON AFRICAN AMERICAN: 27 mL/min — AB (ref >60)
Glucose, Bld: 87 mg/dL (ref 65–99)
POTASSIUM: 3.2 mmol/L — AB (ref 3.5–5.1)
SODIUM: 144 mmol/L (ref 135–145)

## 2018-01-17 LAB — MAGNESIUM: MAGNESIUM: 1.8 mg/dL (ref 1.7–2.4)

## 2018-01-17 LAB — POCT I-STAT 3, ART BLOOD GAS (G3+)
ACID-BASE DEFICIT: 6 mmol/L — AB (ref 0.0–2.0)
Bicarbonate: 18.6 mmol/L — ABNORMAL LOW (ref 20.0–28.0)
O2 Saturation: 82 %
TCO2: 20 mmol/L — ABNORMAL LOW (ref 22–32)
pCO2 arterial: 35.2 mmHg (ref 32.0–48.0)
pH, Arterial: 7.336 — ABNORMAL LOW (ref 7.350–7.450)
pO2, Arterial: 52 mmHg — ABNORMAL LOW (ref 83.0–108.0)

## 2018-01-17 LAB — BPAM RBC
Blood Product Expiration Date: 201906032359
Blood Product Expiration Date: 201906032359
ISSUE DATE / TIME: 201905300415
ISSUE DATE / TIME: 201905300544
UNIT TYPE AND RH: 9500
Unit Type and Rh: 9500

## 2018-01-17 LAB — TROPONIN I: Troponin I: 0.32 ng/mL (ref <0.03)

## 2018-01-17 LAB — PATHOLOGIST SMEAR REVIEW

## 2018-01-17 LAB — URINE CULTURE: Culture: NO GROWTH

## 2018-01-17 LAB — PHOSPHORUS: PHOSPHORUS: 3.3 mg/dL (ref 2.5–4.6)

## 2018-01-17 MED ORDER — ORAL CARE MOUTH RINSE
15.0000 mL | OROMUCOSAL | Status: DC
  Administered 2018-01-17 – 2018-01-24 (×71): 15 mL via OROMUCOSAL

## 2018-01-17 MED ORDER — CHLORHEXIDINE GLUCONATE 0.12% ORAL RINSE (MEDLINE KIT)
15.0000 mL | Freq: Two times a day (BID) | OROMUCOSAL | Status: DC
  Administered 2018-01-17 – 2018-01-24 (×15): 15 mL via OROMUCOSAL

## 2018-01-17 MED ORDER — DILTIAZEM LOAD VIA INFUSION
15.0000 mg | Freq: Once | INTRAVENOUS | Status: AC
  Administered 2018-01-17: 15 mg via INTRAVENOUS
  Filled 2018-01-17: qty 15

## 2018-01-17 MED ORDER — SODIUM CHLORIDE 0.9 % IV SOLN
1.0000 g | INTRAVENOUS | Status: DC
  Administered 2018-01-17 – 2018-01-22 (×6): 1 g via INTRAVENOUS
  Filled 2018-01-17 (×6): qty 1

## 2018-01-17 MED ORDER — DEXTROSE-NACL 5-0.9 % IV SOLN
INTRAVENOUS | Status: DC
  Administered 2018-01-17 – 2018-01-18 (×2): via INTRAVENOUS
  Administered 2018-01-19: 75 mL/h via INTRAVENOUS

## 2018-01-17 MED ORDER — PRAMIPEXOLE DIHYDROCHLORIDE 1.5 MG PO TABS
1.5000 mg | ORAL_TABLET | Freq: Every day | ORAL | Status: DC
  Administered 2018-01-17 – 2018-01-23 (×7): 1.5 mg via ORAL
  Filled 2018-01-17 (×7): qty 1

## 2018-01-17 MED ORDER — DILTIAZEM HCL-DEXTROSE 100-5 MG/100ML-% IV SOLN (PREMIX)
5.0000 mg/h | INTRAVENOUS | Status: DC
  Administered 2018-01-17 (×2): 5 mg/h via INTRAVENOUS
  Administered 2018-01-17 (×2): 10 mg/h via INTRAVENOUS
  Administered 2018-01-18: 5 mg/h via INTRAVENOUS
  Filled 2018-01-17 (×6): qty 100

## 2018-01-17 MED ORDER — SODIUM CHLORIDE 0.9 % IV SOLN
0.0000 ug/min | INTRAVENOUS | Status: DC
  Administered 2018-01-17: 80 ug/min via INTRAVENOUS
  Administered 2018-01-18: 100 ug/min via INTRAVENOUS
  Administered 2018-01-18 (×2): 50 ug/min via INTRAVENOUS
  Filled 2018-01-17: qty 40
  Filled 2018-01-17: qty 4
  Filled 2018-01-17 (×2): qty 40

## 2018-01-17 MED ORDER — POTASSIUM CHLORIDE 10 MEQ/50ML IV SOLN
10.0000 meq | INTRAVENOUS | Status: AC
  Administered 2018-01-17 (×2): 10 meq via INTRAVENOUS
  Filled 2018-01-17 (×2): qty 50

## 2018-01-17 NOTE — H&P (View-Only) (Signed)
[facility] Consult: 3:41 PM 01/17/2018  LOS: 1 day    Referring Provider: Dr Elsworth Soho  Primary Care Physician:  Chipper Herb, MD Primary Gastroenterologist:  Dr Deatra Ina.  Last encounter was 03/2015.    Reason for Consultation: Paraesophageal hernia and dark emesis.   HPI: [name] is a 84 y.o. male.  Hx CAD.s/p CABG 2015.  Atrial fibrillation, on Eliquis.  CKD 3.  S/p left inguinal hernia repair with mesh 09/2016 and "multiple hernia repairs".  Anemia of chronic kidney disease.  GERD and paraesophageal hernia.  Cholangitis, E. coli sepsis/bacteremia, choledocholithiasis 03/2015. ERCP #1 04/04/15. Periampullary diverticulum prevented successful cannulation of CBD and PD.  ERCP #2 04/07/15: sphincterotomy, lithotripsy of large 12 to 15 mm CBD stone, balloon extraction of fragments.  Colonoscopy 01/2012: Nonbleeding, non-ablated cecal AVMs, left-sided and sigmoid diverticulosis. EGD 01/2012: Large hiatal hernia.  Admitted 5/26 to [facility] with nausea, vomiting, atypical chest pain.  CT abdomen pelvis 01/12/2018 revealed large hiatal hernia with gastric antrum and body and distal transverse colon in the chest associated with significant displacement of the pylorus to the level of the diaphragm causing gastric luminal narrowing.  Additionally noted:  right hip DJD with effusion, and iliopsoas bursitis.  Symptoms were attributed to large hiatal hernia and improved with reflux treatment.  Neither general surgery or gastroenterology consultation was obtained.  Although recommendation was made for him to follow-up with his general surgeon as an outpatient.  Discharged 5/28 on Omeprazole 40 mg daily (same as at admission), prn Rolaids.    Presented back to the ED 5/29 with recurrent vomiting and dyspnea.  Hypoxemia led  to intubation during which MD noted reflux of black, liquid emesis.  OG tube placed and greater than 1 L of black liquid suctioned.  A. fib with RVR noted, treated with Cardizem gtt.  He was hypotensive, supported with IV fluids, ultimately phenylephrine and received transfusion with 2 U PRBCs. Hgb over the last week has gone from 12.5 >> 13.6 >> 13.1 >> 13.7 >> 10.6.  MCV 94.  PT/INR normal. AKI. Elevated troponin.  0.07 - 0.32.   CXR shows multifocal pneumonia bilaterally.  NGT coiled in large HH.   On Protonix drip.       Past Medical History:  Diagnosis Date  . Arthritis   . Atrial fibrillation and flutter ([place])   . Choledocholithiasis   . CKD (chronic kidney disease) 08/2013   stage 3  . Coronary artery disease    CABG x 4 09/03/13 with LIMA to LAD, SVG to diag, SVG sequential to OM 1 and OM2  . Dysrhythmia    atrial fib  . GERD (gastroesophageal reflux disease)    Large HH with intrathoracic component.   . H/O hiatal hernia   . Hyperlipemia   . Hypertension   . Hypothyroidism   . Kidney disease, chronic, stage III (GFR 30-59 ml/min) ([place]) 04/11/2017  . Pneumonia ~ 1935  . Prostate cancer [facility])     Past Surgical History:  Procedure Laterality Date  . CATARACT EXTRACTION Bilateral   .  COLONOSCOPY  2003, 01/2012   non-bleeding cecal AVMs, descending and sigmoid tics.   . CORONARY ARTERY BYPASS GRAFT N/A 09/03/2013   Procedure: CORONARY ARTERY BYPASS GRAFTING times four using left internal mammary and bilateral saphenous vein.;  Surgeon: Ivin Poot, MD;  Location: [place];  Service: Open Heart Surgery;  Laterality: N/A;  . ERCP N/A 04/04/2015   Procedure: ENDOSCOPIC RETROGRADE CHOLANGIOPANCREATOGRAPHY (ERCP);  Surgeon: Inda Castle, MD;  Location: [place];  Service: Endoscopy;  Laterality: N/A;  . ERCP N/A 04/06/2015   Procedure: ENDOSCOPIC RETROGRADE CHOLANGIOPANCREATOGRAPHY (ERCP);  Surgeon: Inda Castle, MD;  Location: [facility];  Service: Endoscopy;   Laterality: N/A;  . ESOPHAGOGASTRODUODENOSCOPY  01/2012   11 cm sliding hiatal hernia.  [facility] EYE SURGERY     cataracts removed, ? IOL  . HERNIA REPAIR     abdominal?  . INGUINAL HERNIA REPAIR Left 10/02/2016  . INGUINAL HERNIA REPAIR Left 10/02/2016   Procedure: REPAIR LEFT INGUINAL HERNIA WITH MESH;  Surgeon: Georganna Skeans, MD;  Location: [place];  Service: General;  Laterality: Left;  . INSERTION OF MESH Left 10/02/2016   Procedure: INSERTION OF MESH;  Surgeon: Georganna Skeans, MD;  Location: [place];  Service: General;  Laterality: Left;  . INTRAOPERATIVE TRANSESOPHAGEAL ECHOCARDIOGRAM N/A 09/03/2013   Procedure: INTRAOPERATIVE TRANSESOPHAGEAL ECHOCARDIOGRAM;  Surgeon: Ivin Poot, MD;  Location: [place];  Service: Open Heart Surgery;  Laterality: N/A;  . LEFT HEART CATHETERIZATION WITH CORONARY ANGIOGRAM N/A 08/03/2013   Procedure: LEFT HEART CATHETERIZATION WITH CORONARY ANGIOGRAM;  Surgeon: Blane Ohara, MD;  Location: [facility] C[facility];  Service: Cardiovascular;  Laterality: N/A;  . PROSTATE SURGERY    . THORACOTOMY Right As a young child, ? 60 years old   for pneumonia   . TOTAL KNEE ARTHROPLASTY Right 03/11/2014   Procedure: TOTAL KNEE ARTHROPLASTY;  Surgeon: Hessie Dibble, MD;  Location: [place];  Service: Orthopedics;  Laterality: Right;  . TOTAL KNEE ARTHROPLASTY Left 09/20/2015   Procedure: TOTAL KNEE ARTHROPLASTY;  Surgeon: Melrose Nakayama, MD;  Location: [place];  Service: Orthopedics;  Laterality: Left;    Prior to Admission medications   Medication Sig Start Date End Date Taking? Authorizing Provider  acetaminophen (TYLENOL) 325 MG tablet Take 325-650 mg by mouth every 6 (six) hours as needed (for pain.).    [provider]  Ca Carbonate-Mag Hydroxide (ROLAIDS PO) Take 1-2 tablets by mouth 3 (three) times daily as needed (for heartburn/indigestion).    [provider]  carbamide peroxide (DEBROX) 6.5 % OTIC solution 5 drops 2 (two) times daily. To help clean out ears     [provider]  Cholecalciferol (VITAMIN D3) 1000 UNITS CAPS Take 1,000 Units by mouth every morning.     [provider]  diltiazem (CARDIZEM CD) 240 MG 24 hr capsule Take 1 capsule (240 mg total) by mouth daily. 08/30/17   Chipper Herb, MD  ELIQUIS 2.5 MG TABS tablet TAKE 1 TABLET TWICE DAILY 11/02/17   Chipper Herb, MD  fesoterodine (TOVIAZ) 4 MG TB24 tablet Take 1 tablet (4 mg total) by mouth daily. 08/30/17   Chipper Herb, MD  furosemide (LASIX) 40 MG tablet Take 1 tablet (40 mg total) by mouth daily. 01/15/18   Murlean Iba, MD  levothyroxine (SYNTHROID, LEVOTHROID) 25 MCG tablet Take 1 tablet (25 mcg total) by mouth daily before breakfast. 08/30/17   Chipper Herb, MD  nitroGLYCERIN (NITROSTAT) 0.4 MG SL tablet Place 1 tablet (0.4 mg  total) under the tongue every 5 (five) minutes as needed for chest pain. 06/06/16 09/20/17  Minus Breeding, MD  omeprazole (PRILOSEC) 40 MG capsule Take 1 capsule (40 mg total) by mouth daily. 08/30/17   Chipper Herb, MD  pramipexole (MIRAPEX) 1 MG tablet Take 1.5 tablets (1.5 mg total) by mouth at bedtime. 08/30/17   Chipper Herb, MD  pravastatin (PRAVACHOL) 40 MG tablet Take 1 tablet (40 mg total) by mouth daily. 08/30/17   Chipper Herb, MD  traMADol (ULTRAM) 50 MG tablet Take 1 tablet (50 mg total) by mouth every 6 (six) hours as needed for moderate pain. 10/03/16   Georganna Skeans, MD  traZODone (DESYREL) 100 MG tablet Take 1/2 tablet by mouth at bedtime if needed 01/01/18   Chipper Herb, MD    Scheduled Meds: . chlorhexidine gluconate (MEDLINE KIT)  15 mL Mouth Rinse BID  . ipratropium-albuterol  3 mL Nebulization Q6H  . mouth rinse  15 mL Mouth Rinse 10 times per day  . [START ON 01/19/2018] pantoprazole  40 mg Intravenous Q12H   Infusions: . sodium chloride    . sodium chloride    . ceFEPime (MAXIPIME) IV    . dextrose 5 % and 0.9% NaCl 50 mL/hr at 01/17/18 1500  . diltiazem (CARDIZEM) infusion 10 mg/hr  (01/17/18 1500)  . metronidazole Stopped (01/17/18 1249)  . pantoprozole (PROTONIX) infusion 8 mg/hr (01/17/18 1500)  . phenylephrine (NEO-SYNEPHRINE) Adult infusion 70 mcg/min (01/17/18 1500)  . propofol (DIPRIVAN) infusion Stopped (01/17/18 0830)   PRN Meds: sodium chloride, acetaminophen, fentaNYL (SUBLIMAZE) injection, fentaNYL (SUBLIMAZE) injection, midazolam   Allergies as of 01/15/2018 - Review Complete 01/15/2018  Allergen Reaction Noted  . Lipitor [atorvastatin] Other (See Comments) 01/15/2012  . Doxycycline Rash 12/17/2016  . Penicillins Itching 01/26/2015    Family History  Problem Relation Age of Onset  . Cancer Mother        bone marrow  . Cancer Father   . Cancer Brother        skin  . Healthy Son   . Colon cancer Neg Hx     Social History   Socioeconomic History  . Marital status: Widowed    Spouse name: Not on file  . Number of children: 1  . Years of education: Not on file  . Highest education level: Not on file  Occupational History  . Occupation: Retired   Scientific laboratory technician  . Financial resource strain: Not on file  . Food insecurity:    Worry: Not on file    Inability: Not on file  . Transportation needs:    Medical: Not on file    Non-medical: Not on file  Tobacco Use  . Smoking status: Former Smoker    Packs/day: 1.00    Types: Cigarettes    Last attempt to quit: 08/20/1978    Years since quitting: 39.4  . Smokeless tobacco: Never Used  Substance and Sexual Activity  . Alcohol use: No  . Drug use: No  . Sexual activity: Never  Lifestyle  . Physical activity:    Days per week: Not on file    Minutes per session: Not on file  . Stress: Not on file  Relationships  . Social connections:    Talks on phone: Not on file    Gets together: Not on file    Attends religious service: Not on file    Active member of club or organization: Not on file    Attends meetings of  clubs or organizations: Not on file    Relationship status: Not on file  .  Intimate partner violence:    Fear of current or ex partner: Not on file    Emotionally abused: Not on file    Physically abused: Not on file    Forced sexual activity: Not on file  Other Topics Concern  . Not on file  Social History Narrative   No caffeine     REVIEW OF SYSTEMS: Constitutional: Feeling weak and tired. ENT:  No nose bleeds Pulm: No cough currently. CV:  No palpitations, no LE edema.  No current chest pain. GU:  No hematuria, no frequency GI:  Per HPI Heme: No excessive or unusual bleeding/bruising. Transfusions: None. Neuro:  No headaches, no peripheral tingling or numbness.  No syncope. Derm:  No itching, no rash or sores.  Endocrine:  No sweats or chills.  No polyuria or dysuria Immunization: Did not ask him about recent or previous vaccinations. Travel:  None beyond local counties in last few months.    PHYSICAL EXAM: Vital signs in last 24 hours: Vitals:   01/17/18 1520 01/17/18 1530  BP: (!) 85/47 (!) 89/71  Pulse: (!) 103 94  Resp: (!) 28 (!) 23  Temp:    SpO2: 93% 93%   Wt Readings from Last 3 Encounters:  01/17/18 153 lb 10.6 oz (69.7 kg)  01/12/18 142 lb 3.2 oz (64.5 kg)  01/02/18 148 lb (67.1 kg)    General: Thin, aged, tired looking and somewhat frail appearing WM. Head: No signs of head trauma.  No facial asymmetry or swelling. Eyes: No scleral icterus.  Conjunctiva pink. Ears: Not hard of hearing. Nose: No discharge or congestion.  NG tube in place, dark but not bloody looking effluent in tubing and canister. Mouth: ET tube in place.  No blood in the mouth. Neck: No JVD Lungs: Clear bilaterally in front with good breath sounds.  No labored breathing on the vent. Heart:  RRR.  NSR in 80s on tele Abdomen: Not distended or tender.  Bowel sounds quiet.  No tinkling or tympanitic bowel sounds.  No HSM, no abdominal hernias..   Rectal: Deferred. Musc/Skeltl: Arthritic changes in the fingers. Extremities: No CCE.  Feet are warm.  Pedal  pulses 2-3+ Neurologic: Follows simple commands such as wiggling his fingers and toes.  Alert, trying to speak but unable to do so with ETT in place Skin: No open sores, rashes or significant purpura.   Intake/Output from previous day: 05/30 0701 - 05/31 0700 In: 5071.1 [I.V.:5071.1] Out: 1855 [Urine:1055; Emesis/NG output:800] Intake/Output this shift: Total I/O In: 1758.5 [I.V.:1558.5; IV Piggyback:200] Out: 460 [Urine:210; Emesis/NG output:250]  LAB RESULTS: Recent Labs    01/15/18 2327 01/16/18 1216 01/17/18 0500  WBC 15.1* 5.3 10.2  HGB 13.1 13.7 10.6*  HCT 39.8 42.0 32.5*  PLT 320 281 212   BMET Lab Results  Component Value Date   NA 144 01/17/2018   NA 142 01/15/2018   NA 146 (H) 01/13/2018   K 3.2 (L) 01/17/2018   K 3.5 01/15/2018   K 3.8 01/13/2018   CL 112 (H) 01/17/2018   CL 98 (L) 01/15/2018   CL 99 (L) 01/13/2018   CO2 21 (L) 01/17/2018   CO2 26 01/15/2018   CO2 34 (H) 01/13/2018   GLUCOSE 87 01/17/2018   GLUCOSE 116 (H) 01/15/2018   GLUCOSE 140 (H) 01/13/2018   BUN 47 (H) 01/17/2018   BUN 53 (H) 01/15/2018   BUN 27 (  H) 01/13/2018   CREATININE 2.05 (H) 01/17/2018   CREATININE 1.95 (H) 01/15/2018   CREATININE 1.84 (H) 01/13/2018   CALCIUM 7.7 (L) 01/17/2018   CALCIUM 10.3 01/15/2018   CALCIUM 10.4 (H) 01/13/2018   LFT Recent Labs    01/15/18 2327  PROT 7.2  ALBUMIN 3.6  AST 35  ALT 24  ALKPHOS 72  BILITOT 1.0   PT/INR Lab Results  Component Value Date   INR 1.06 07/18/2017   INR 1.08 10/02/2016   INR 1.20 09/13/2015   Hepatitis Panel No results for input(s): HEPBSAG, HCVAB, HEPAIGM, HEPBIGM in the last 72 hours. C-Diff No components found for: CDIFF Lipase     Component Value Date/Time   LIPASE 38 01/12/2018 0643    Drugs of Abuse  No results found for: LABOPIA, COCAINSCRNUR, LABBENZ, AMPHETMU, THCU, LABBARB   RADIOLOGY STUDIES: Dg Chest 1 View  Result Date: 01/16/2018 CLINICAL DATA:  Attempted central line right side.  EXAM: CHEST  1 VIEW COMPARISON:  01/16/2018.  CT 01/12/2018. FINDINGS: Endotracheal tube in stable position. NG tube again noted with coiled in a large hiatal hernia. Cardiomegaly. Unchanged right upper lobe and bibasilar infiltrates. No pleural effusion or pneumothorax. Air-filled loops of bowel noted. No acute bony abnormality. IMPRESSION: 1. Endotracheal tube and NG tube in stable position. NG tube is noted coiled in a hiatal hernia in stable position. 2. Persistent right upper lobe and bibasilar infiltrates. Similar finding noted on prior exam. Electronically Signed   By: Atkins   On: 01/16/2018 14:28   Dg Chest Port 1 View  Result Date: 01/17/2018 CLINICAL DATA:  Respiratory failure. EXAM: PORTABLE CHEST 1 VIEW COMPARISON:  Radiograph of Jan 16, 2018. FINDINGS: Stable cardiomediastinal silhouette. Status post coronary artery bypass graft. Endotracheal tube is in grossly good position. Left internal jugular catheter is unchanged. Distal tip of nasogastric tube is seen within hiatal hernia above the diaphragm. Stable bilateral lung opacities are noted, right greater than left concerning for multifocal pneumonia. No pneumothorax is noted. No significant pleural effusion is noted. Bony thorax is unremarkable. IMPRESSION: Distal tip of nasogastric tube is looped within hiatal hernia above the left hemidiaphragm. Stable bilateral lung opacities concerning for multifocal pneumonia. Electronically Signed   By: Marijo Conception, M.D.   On: 01/17/2018 07:58   Dg Chest Port 1 View  Result Date: 01/16/2018 CLINICAL DATA:  History of central venous line caught EXAM: PORTABLE CHEST 1 VIEW COMPARISON:  Portable chest x-ray of 01/16/2018, FINDINGS: The tip of the endotracheal tube is approximately 3.2 cm above the carina. Patchy airspace disease remains throughout the right lung and in the left upper lobe most consistent with multifocal pneumonia. Left central venous line tip overlies the upper SVC. No  pneumothorax is seen. Cardiomegaly is stable. Retrocardiac opacity is consistent with moderately large hiatal hernia or compared to CT of 01/12/2018. No bony abnormality is noted. IMPRESSION: 1. Patchy airspace disease right greater than left most consistent with multifocal pneumonia. 2. Tip of endotracheal tube 3.2 cm above the carina. 3. New left central venous line tip overlies the mid upper SVC. No pneumothorax. 4. Moderately large hiatal hernia. Electronically Signed   By: Ivar Drape M.D.   On: 01/16/2018 15:52   Dg Chest Port 1 View  Result Date: 01/16/2018 CLINICAL DATA:  Respiratory failure/hypoxia EXAM: PORTABLE CHEST 1 VIEW COMPARISON:  Study obtained earlier in the day FINDINGS: Endotracheal tube tip is 2.8 cm above the carina. Nasogastric tube tip and side port are and a  large paraesophageal hernia. No pneumothorax. There is increase in airspace consolidation in the right upper lobe. There is extensive airspace consolidation in the right base region. There is atelectatic change in the left base. There is cardiomegaly with pulmonary vascularity normal. Patient is status post coronary artery bypass grafting. There is aortic atherosclerosis. No evident bone lesions. No adenopathy by radiography apparent. IMPRESSION: Tube positions as described without pneumothorax. Note that the nasogastric tube tip and side port are in a large paraesophageal type hernia. Extensive airspace consolidation is noted throughout much of the right lung with increase in opacity in the right upper lobe compared to earlier in the day. Suspect progression of pneumonia in this area, although aspiration is a differential consideration. There is atelectatic change in the left lower lobe region, stable. There is stable cardiac prominence. There is aortic atherosclerosis. Aortic Atherosclerosis (ICD10-I70.0). Electronically Signed   By: Lowella Grip III M.D.   On: 01/16/2018 09:44   Dg Chest Port 1 View  Result Date:  01/16/2018 CLINICAL DATA:  84 y/o  M; ET tube placement. EXAM: PORTABLE CHEST 1 VIEW COMPARISON:  01/15/2018 chest radiograph. FINDINGS: Stable cardiomegaly given projection and technique. Enteric tube tip is coiling within a large hiatal hernia. Endotracheal tube tip projects 2.6 cm above the carina. Increased patchy consolidations of the mid and lower lung zones. Multilevel degenerative changes of the spine. No acute osseous abnormality is evident. IMPRESSION: 1. Endotracheal tube tip 2.6 cm above the carina. 2. Enteric tube tip coiling within large hiatal hernia. 3. Increased consolidation within mid and lower lung zones. Electronically Signed   By: Kristine Garbe M.D.   On: 01/16/2018 03:24   Dg Chest Port 1 View  Result Date: 01/15/2018 CLINICAL DATA:  Shortness of breath and vomiting. EXAM: PORTABLE CHEST 1 VIEW COMPARISON:  Jan 12, 2018 FINDINGS: A very large hiatal hernia is identified. The heart, hila, and mediastinum are unchanged. No pneumothorax. No focal infiltrate in the left lung. Mild opacity in the right mid and lower lung. No other acute abnormalities. IMPRESSION: 1. Large hiatal hernia. 2. Cardiomegaly. 3. Mild new opacity in the right mid and lower lung. Electronically Signed   By: Dorise Bullion III M.D   On: 01/15/2018 23:33     IMPRESSION:   *   Intrathoracic hiatal hernia leading to relative gastric outlet obstruction. Abundant, dark, gastric material seen at ET tube placement and suctioned via OG tube.   Protonix drip in place.  *    Normocytic anemia.  S/p 2 U PRBCs.    *     Multifocal, bilateral pneumonia.  Due to aspiration.  On cefepime, Flagyl.  *   AKI.    *   History of atrial fibrillation, on Eliquis.  A. fib with RVR on arrival yesterday currently in NSR.   PLAN:     *   EGD tomorrow.  Keep intubated for this.     Azucena Freed  01/17/2018, 3:41 PM Phone [phone]  GI ATTENDING  History, laboratories, x-rays personally reviewed.  Patient personally seen and examined. Several family members in room including son [name]. Agree with comprehensive consultation note as outlined above. Patient with complicated large diaphragmatic hernia encompassing stomach and transverse colon recent CT. Recent problems with significant nausea and vomiting. Admitted with aspiration associated respiratory compromise requiring mechanical intubation. Now in the intensive care unit. Has had coffee-ground material in the NG tube with drifting hemoglobin. As contacted by the pulmonary attending physician Dr. Elsworth Soho requesting evaluation.  The patient has been on Eliquis for a history of atrial fibrillation. This is being held. At this point, his problems are certainly secondary to his hernia. The only reasonable option is surgical repair. I am planning upper endoscopy tomorrow while he is still intubated to assess for any other significant concurrent pathology given the coffee-ground emesis. Patient is high-risk. I discussed this with the family. I do recommend general surgical consultation at this time.  Docia Chuck. Geri Seminole., M.D. [facility] Division of Gastroenterology

## 2018-01-17 NOTE — Progress Notes (Signed)
Initial Nutrition Assessment  DOCUMENTATION CODES:   Not applicable  INTERVENTION:   If unable to extubate patient within next 24 hours, consider starting TF if GI status allows:  Vital AF 1.2 at 65 ml/h (1560 ml per day)  Provides 1872 kcal, 117 gm protein, 1265 ml free water daily  NUTRITION DIAGNOSIS:   Inadequate oral intake related to inability to eat as evidenced by NPO status.  GOAL:   Patient will meet greater than or equal to 90% of their needs  MONITOR:   Vent status, Labs, I & O's  REASON FOR ASSESSMENT:   Ventilator    ASSESSMENT:   84 yo male with PMH of prostate cancer, GERD, HTN, HLD, CAD, hypothyroidism, CKD, and A fib & flutter who was admitted on 5/29 with respiratory failure, aspiration PNA, suspected GIB.  Discussed patient in ICU rounds and with RN today. Patient with coffee ground output from OG tube this morning. Plans to keep NPO today.   Patient is currently intubated on ventilator support MV: 14.8 L/min Temp (24hrs), Avg:100.4 F (38 C), Min:98.1 F (36.7 C), Max:100.9 F (38.3 C)  Propofol: off Labs reviewed. Potassium 3.2 (L) CBG's: 72-85-72 Medications reviewed.  NUTRITION - FOCUSED PHYSICAL EXAM:  unable to complete at this time  Diet Order:   Diet Order           Diet NPO time specified  Diet effective now          EDUCATION NEEDS:   No education needs have been identified at this time  Skin:  Skin Assessment: Skin Integrity Issues: Skin Integrity Issues:: Stage I Stage I: coccyx  Last BM:  unknown  Height:   Ht Readings from Last 1 Encounters:  01/15/18 5\' 11"  (1.803 m)    Weight:   Wt Readings from Last 1 Encounters:  01/17/18 153 lb 10.6 oz (69.7 kg)    Ideal Body Weight:  78.2 kg  BMI:  Body mass index is 21.43 kg/m.  Estimated Nutritional Needs:   Kcal:  1975  Protein:  100-115 gm  Fluid:  1.8-2 L    Molli Barrows, RD, LDN, CNSC Pager [phone] After Hours Pager [phone]

## 2018-01-17 NOTE — Progress Notes (Signed)
eLink Physician-Brief Progress Note Patient Name: [name] DOB: 1927-04-20 MRN: [id number]   Date of Service  01/17/2018  HPI/Events of Note  A fib with RVR.  Blood pressure okay.  eICU Interventions  Will add cardizem gtt.  AM labs pending.        [name] 01/17/2018, 2:38 AM

## 2018-01-17 NOTE — Progress Notes (Signed)
PULMONARY / CRITICAL CARE MEDICINE   Name: [name] MRN: [id number] DOB: 05-18-1927    ADMISSION DATE:  01/15/2018 CONSULTATION DATE:  01/16/2018  REFERRING MD:  Dr. Stark Jock EDP. Forestine Na  CHIEF COMPLAINT:  Respiratory failure  HISTORY OF PRESENT ILLNESS:  Patient is encephalopathic and/or intubated. Therefore history has been obtained from chart review. 84 year old male with PMH as below, which is significant for Afib on eliquis, CAD s/p CABG 2015, and GERD. He was admitted 5/26 to [facility] for complaints felt to be secondary to his large hiatal hernia. He was having nausea/vomiting and atypical chest pain. Symptoms resolved with reflux treatment and he was ultimately discharged to home 5/28. He presented again to the ED 5/29 with complaints of continued vomiting and dyspnea. Upon arrival to the ED he was hypoxemic ultimately requiring intubation. Intubatio was complicated by reflux of black liquid emesis. OG tube was placed and removed > 1000cc black liquid. He became more hypotensive and was given IVF and blood products (2 units PRBC) and eventually required phenylephrine infusion. PCCM accepting transfer for respiratory failure.   SIGNIFICANT EVENTS: 5/26>5/28 admit for chest pain and vomiting felt secondary to hiatal hernia.  5/30 admit to 2104 5/31 AF-RVR >> cardizem gtt  SUBJECTIVE:  Remains critically ill, intubated. On 60 mics of Neo-Synephrine. Urine output adequate  VITAL SIGNS: BP 113/67   Pulse 74   Temp 98.3 F (36.8 C) (Oral)   Resp (!) 26   Ht 5\' 11"  (1.803 m)   Wt 153 lb 10.6 oz (69.7 kg)   SpO2 95%   BMI 21.43 kg/m   HEMODYNAMICS: CVP:  [8 mmHg-12 mmHg] 8 mmHg  VENTILATOR SETTINGS: Vent Mode: PRVC FiO2 (%):  [40 %-60 %] 40 % Set Rate:  [18 bmp-20 bmp] 20 bmp Vt Set:  [600 mL] 600 mL PEEP:  [5 cmH20-8 cmH20] 8 cmH20 Plateau Pressure:  [22 cmH20-26 cmH20] 25 cmH20  INTAKE / OUTPUT: I/O last 3 completed shifts: In: 7197.7 [I.V.:4971.1; IV  Piggyback:2226.6] Out: 2755 [Urine:1055; Emesis/NG output:1700]  PHYSICAL EXAMINATION: Frail, elderly man, orally intubated, in no distress. Able to write to communicate. Mild pallor, no icterus, no JVD, dry mucosa. Decreased breath sounds bilateral, minimal secretions S1-S2 normal, sinus rhythm on monitor, no murmurs or rubs, on Cardizem drip Soft, nontender abdomen Grossly nonfocal, follows commands  LABS:  BMET Recent Labs  Lab 01/13/18 0135 01/15/18 2327 01/17/18 0500  NA 146* 142 144  K 3.8 3.5 3.2*  CL 99* 98* 112*  CO2 34* 26 21*  BUN 27* 53* 47*  CREATININE 1.84* 1.95* 2.05*  GLUCOSE 140* 116* 87    Electrolytes Recent Labs  Lab 01/13/18 0135 01/15/18 2327 01/17/18 0500  CALCIUM 10.4* 10.3 7.7*  MG 2.0  --  1.8  PHOS  --   --  3.3    CBC Recent Labs  Lab 01/15/18 2327 01/16/18 1216 01/17/18 0500  WBC 15.1* 5.3 10.2  HGB 13.1 13.7 10.6*  HCT 39.8 42.0 32.5*  PLT 320 281 212    Coag's No results for input(s): APTT, INR in the last 168 hours.  Sepsis Markers Recent Labs  Lab 01/16/18 0009 01/16/18 0355  LATICACIDVEN 4.24* 1.85    ABG Recent Labs  Lab 01/16/18 0250 01/16/18 1625 01/17/18 0353  PHART 7.298* 7.377 7.336*  PCO2ART 49.5* 36.4 35.2  PO2ART 98.1 56.3* 52.0*    Liver Enzymes Recent Labs  Lab 01/12/18 0643 01/13/18 0135 01/15/18 2327  AST 22 28 35  ALT 15* 17 24  ALKPHOS 84 87 72  BILITOT 0.9 1.0 1.0  ALBUMIN 3.8 3.6 3.6    Cardiac Enzymes Recent Labs  Lab 01/12/18 1917 01/13/18 0135 01/15/18 2327  TROPONINI 0.06* 0.07* 0.27*    Glucose Recent Labs  Lab 01/16/18 1614 01/16/18 1646 01/16/18 1926 01/16/18 2321 01/17/18 0408 01/17/18 0821  GLUCAP 54* 99 72 96 72 85    Imaging Dg Chest 1 View  Result Date: 01/16/2018 CLINICAL DATA:  Attempted central line right side. EXAM: CHEST  1 VIEW COMPARISON:  01/16/2018.  CT 01/12/2018. FINDINGS: Endotracheal tube in stable position. NG tube again noted with  coiled in a large hiatal hernia. Cardiomegaly. Unchanged right upper lobe and bibasilar infiltrates. No pleural effusion or pneumothorax. Air-filled loops of bowel noted. No acute bony abnormality. IMPRESSION: 1. Endotracheal tube and NG tube in stable position. NG tube is noted coiled in a hiatal hernia in stable position. 2. Persistent right upper lobe and bibasilar infiltrates. Similar finding noted on prior exam. Electronically Signed   By: [place]   On: 01/16/2018 14:28   Dg Chest Port 1 View  Result Date: 01/17/2018 CLINICAL DATA:  Respiratory failure. EXAM: PORTABLE CHEST 1 VIEW COMPARISON:  Radiograph of Jan 16, 2018. FINDINGS: Stable cardiomediastinal silhouette. Status post coronary artery bypass graft. Endotracheal tube is in grossly good position. Left internal jugular catheter is unchanged. Distal tip of nasogastric tube is seen within hiatal hernia above the diaphragm. Stable bilateral lung opacities are noted, right greater than left concerning for multifocal pneumonia. No pneumothorax is noted. No significant pleural effusion is noted. Bony thorax is unremarkable. IMPRESSION: Distal tip of nasogastric tube is looped within hiatal hernia above the left hemidiaphragm. Stable bilateral lung opacities concerning for multifocal pneumonia. Electronically Signed   By: Marijo Conception, M.D.   On: 01/17/2018 07:58   Dg Chest Port 1 View  Result Date: 01/16/2018 CLINICAL DATA:  History of central venous line caught EXAM: PORTABLE CHEST 1 VIEW COMPARISON:  Portable chest x-ray of 01/16/2018, FINDINGS: The tip of the endotracheal tube is approximately 3.2 cm above the carina. Patchy airspace disease remains throughout the right lung and in the left upper lobe most consistent with multifocal pneumonia. Left central venous line tip overlies the upper SVC. No pneumothorax is seen. Cardiomegaly is stable. Retrocardiac opacity is consistent with moderately large hiatal hernia or compared to CT of  01/12/2018. No bony abnormality is noted. IMPRESSION: 1. Patchy airspace disease right greater than left most consistent with multifocal pneumonia. 2. Tip of endotracheal tube 3.2 cm above the carina. 3. New left central venous line tip overlies the mid upper SVC. No pneumothorax. 4. Moderately large hiatal hernia. Electronically Signed   By: Ivar Drape M.D.   On: 01/16/2018 15:52   Dg Chest Port 1 View  Result Date: 01/16/2018 CLINICAL DATA:  Respiratory failure/hypoxia EXAM: PORTABLE CHEST 1 VIEW COMPARISON:  Study obtained earlier in the day FINDINGS: Endotracheal tube tip is 2.8 cm above the carina. Nasogastric tube tip and side port are and a large paraesophageal hernia. No pneumothorax. There is increase in airspace consolidation in the right upper lobe. There is extensive airspace consolidation in the right base region. There is atelectatic change in the left base. There is cardiomegaly with pulmonary vascularity normal. Patient is status post coronary artery bypass grafting. There is aortic atherosclerosis. No evident bone lesions. No adenopathy by radiography apparent. IMPRESSION: Tube positions as described without pneumothorax. Note that the nasogastric tube tip and  side port are in a large paraesophageal type hernia. Extensive airspace consolidation is noted throughout much of the right lung with increase in opacity in the right upper lobe compared to earlier in the day. Suspect progression of pneumonia in this area, although aspiration is a differential consideration. There is atelectatic change in the left lower lobe region, stable. There is stable cardiac prominence. There is aortic atherosclerosis. Aortic Atherosclerosis (ICD10-I70.0). Electronically Signed   By: Lowella Grip III M.D.   On: 01/16/2018 09:44     STUDIES:  5/26 CT abd > Progressive large hiatal hernia involving stomach and distal transverse colon. 3 areas of relative luminal narrowing in the stomach may result in a  degree of relative obstruction and account for the patient's symptoms. Advanced right hip DJD with effusion and iliopsoas bursitis with effusion. Sigmoid diverticulosis.  CULTURES:  12/20/2017 blood cultures x2>> 01/16/2018 sputum culture>> Urine strep ag neg  ANTIBIOTICS: Aztreonam , flagyl, and vancomycin given in ED 5/30  01/16/2018 vancomycin>>5/31 01/16/2018 cefepime>> 12/20/2017 Flagyl>>    LINES/TUBES: ETT 5/30 > 01/16/2018 NG tube>>  DISCUSSION:  Appears to be aspiration pneumonia due to large hiatal hernia and consequent sepsis. Concern for upper GI bleed   ASSESSMENT / PLAN:  PULMONARY A: Acute respiratory failure secondary to suspected aspiration during vomiting. COPD  P:   Vent settings reviewed and adjusted. Keep PEEP at 8 not ready for spontaneous breathing trials yet,  Ct Bronchodilators   CARDIOVASCULAR A:  Elevated troponins History of coronary artery disease status post CABG 2016 Preserved EF of the last 2D echo revealed EF of 65% with grade 1 diastolic dysfunction History of atrial fibrillation on Eliquis, RVR 5/31 converted back to nSR P:  Repeat troponin Hold Eliquis due to UGIB    RENAL A:   AKI on CKD 3 P:   Monitor creatinine Avoid nephrotoxins Replete hypokalemia   GASTROINTESTINAL A:   Nausea vomiting - large hiatal hernia UGI bleed with dark emesis P:   Protonix drip GI consult N.p.o.   HEMATOLOGIC Recent Labs    01/16/18 1216 01/17/18 0500  HGB 13.7 10.6*    A:   Acute blood loss anemia  P:  Transfuse for Hb <8  INFECTIOUS A:   Aspiration pneumonia P:   Dc vancomycin,  Ct Flagyl, cefepime Await culture  ENDOCRINE CBG (last 3)  Recent Labs    01/16/18 2321 01/17/18 0408 01/17/18 0821  GLUCAP 96 72 85     A:   No acute abnormalities P:   Monitor glucose  NEUROLOGIC A:   Currently sedated on ventilator P:   RASS goal: -1 WUA Propofol gtt + int fent   FAMILY  - Updates: Family at  bedside  - Inter-disciplinary family meet or Palliative Care meeting due by:  day 7  The patient is critically ill with multiple organ systems failure and requires high complexity decision making for assessment and support, frequent evaluation and titration of therapies, application of advanced monitoring technologies and extensive interpretation of multiple databases. Critical Care Time devoted to patient care services described in this note independent of APP/resident  time is 35 minutes.   Kara Mead MD. Shade Flood. [facility] Pager [phone] If no response call [phone]    01/17/2018, 9:36 AM '

## 2018-01-17 NOTE — Plan of Care (Signed)
Pt alert, communicates appropriately by writing and gestures. Neo and Cardizem are infusing. Family at the bedside and updated by the MD (CCM, GI). Education and emotional support was given.  NPO at this time due to gastric secretions.  Bed in low position, alarms are on, call bell in reach, continue to monitor.

## 2018-01-17 NOTE — Consult Note (Addendum)
[facility] Consult: 3:41 PM 01/17/2018  LOS: 1 day    Referring Provider: Dr Elsworth Soho  Primary Care Physician:  Chipper Herb, MD Primary Gastroenterologist:  Dr Deatra Ina.  Last encounter was 03/2015.    Reason for Consultation: Paraesophageal hernia and dark emesis.   HPI: [name] is a 84 y.o. male.  Hx CAD.s/p CABG 2015.  Atrial fibrillation, on Eliquis.  CKD 3.  S/p left inguinal hernia repair with mesh 09/2016 and "multiple hernia repairs".  Anemia of chronic kidney disease.  GERD and paraesophageal hernia.  Cholangitis, E. coli sepsis/bacteremia, choledocholithiasis 03/2015. ERCP #1 04/04/15. Periampullary diverticulum prevented successful cannulation of CBD and PD.  ERCP #2 04/07/15: sphincterotomy, lithotripsy of large 12 to 15 mm CBD stone, balloon extraction of fragments.  Colonoscopy 01/2012: Nonbleeding, non-ablated cecal AVMs, left-sided and sigmoid diverticulosis. EGD 01/2012: Large hiatal hernia.  Admitted 5/26 to [facility] with nausea, vomiting, atypical chest pain.  CT abdomen pelvis 01/12/2018 revealed large hiatal hernia with gastric antrum and body and distal transverse colon in the chest associated with significant displacement of the pylorus to the level of the diaphragm causing gastric luminal narrowing.  Additionally noted:  right hip DJD with effusion, and iliopsoas bursitis.  Symptoms were attributed to large hiatal hernia and improved with reflux treatment.  Neither general surgery or gastroenterology consultation was obtained.  Although recommendation was made for him to follow-up with his general surgeon as an outpatient.  Discharged 5/28 on Omeprazole 40 mg daily (same as at admission), prn Rolaids.    Presented back to the ED 5/29 with recurrent vomiting and dyspnea.  Hypoxemia led  to intubation during which MD noted reflux of black, liquid emesis.  OG tube placed and greater than 1 L of black liquid suctioned.  A. fib with RVR noted, treated with Cardizem gtt.  He was hypotensive, supported with IV fluids, ultimately phenylephrine and received transfusion with 2 U PRBCs. Hgb over the last week has gone from 12.5 >> 13.6 >> 13.1 >> 13.7 >> 10.6.  MCV 94.  PT/INR normal. AKI. Elevated troponin.  0.07 - 0.32.   CXR shows multifocal pneumonia bilaterally.  NGT coiled in large HH.   On Protonix drip.       Past Medical History:  Diagnosis Date  . Arthritis   . Atrial fibrillation and flutter ([place])   . Choledocholithiasis   . CKD (chronic kidney disease) 08/2013   stage 3  . Coronary artery disease    CABG x 4 09/03/13 with LIMA to LAD, SVG to diag, SVG sequential to OM 1 and OM2  . Dysrhythmia    atrial fib  . GERD (gastroesophageal reflux disease)    Large HH with intrathoracic component.   . H/O hiatal hernia   . Hyperlipemia   . Hypertension   . Hypothyroidism   . Kidney disease, chronic, stage III (GFR 30-59 ml/min) ([place]) 04/11/2017  . Pneumonia ~ 1935  . Prostate cancer [facility])     Past Surgical History:  Procedure Laterality Date  . CATARACT EXTRACTION Bilateral   .  COLONOSCOPY  2003, 01/2012   non-bleeding cecal AVMs, descending and sigmoid tics.   . CORONARY ARTERY BYPASS GRAFT N/A 09/03/2013   Procedure: CORONARY ARTERY BYPASS GRAFTING times four using left internal mammary and bilateral saphenous vein.;  Surgeon: Ivin Poot, MD;  Location: [place];  Service: Open Heart Surgery;  Laterality: N/A;  . ERCP N/A 04/04/2015   Procedure: ENDOSCOPIC RETROGRADE CHOLANGIOPANCREATOGRAPHY (ERCP);  Surgeon: Inda Castle, MD;  Location: [place];  Service: Endoscopy;  Laterality: N/A;  . ERCP N/A 04/06/2015   Procedure: ENDOSCOPIC RETROGRADE CHOLANGIOPANCREATOGRAPHY (ERCP);  Surgeon: Inda Castle, MD;  Location: [facility];  Service: Endoscopy;   Laterality: N/A;  . ESOPHAGOGASTRODUODENOSCOPY  01/2012   11 cm sliding hiatal hernia.  [facility] EYE SURGERY     cataracts removed, ? IOL  . HERNIA REPAIR     abdominal?  . INGUINAL HERNIA REPAIR Left 10/02/2016  . INGUINAL HERNIA REPAIR Left 10/02/2016   Procedure: REPAIR LEFT INGUINAL HERNIA WITH MESH;  Surgeon: Georganna Skeans, MD;  Location: [place];  Service: General;  Laterality: Left;  . INSERTION OF MESH Left 10/02/2016   Procedure: INSERTION OF MESH;  Surgeon: Georganna Skeans, MD;  Location: [place];  Service: General;  Laterality: Left;  . INTRAOPERATIVE TRANSESOPHAGEAL ECHOCARDIOGRAM N/A 09/03/2013   Procedure: INTRAOPERATIVE TRANSESOPHAGEAL ECHOCARDIOGRAM;  Surgeon: Ivin Poot, MD;  Location: [place];  Service: Open Heart Surgery;  Laterality: N/A;  . LEFT HEART CATHETERIZATION WITH CORONARY ANGIOGRAM N/A 08/03/2013   Procedure: LEFT HEART CATHETERIZATION WITH CORONARY ANGIOGRAM;  Surgeon: Blane Ohara, MD;  Location: [facility] C[facility] LAB;  Service: Cardiovascular;  Laterality: N/A;  . PROSTATE SURGERY    . THORACOTOMY Right As a young child, ? 60 years old   for pneumonia   . TOTAL KNEE ARTHROPLASTY Right 03/11/2014   Procedure: TOTAL KNEE ARTHROPLASTY;  Surgeon: Hessie Dibble, MD;  Location: [place];  Service: Orthopedics;  Laterality: Right;  . TOTAL KNEE ARTHROPLASTY Left 09/20/2015   Procedure: TOTAL KNEE ARTHROPLASTY;  Surgeon: Melrose Nakayama, MD;  Location: [place];  Service: Orthopedics;  Laterality: Left;    Prior to Admission medications   Medication Sig Start Date End Date Taking? Authorizing Provider  acetaminophen (TYLENOL) 325 MG tablet Take 325-650 mg by mouth every 6 (six) hours as needed (for pain.).    [provider]  Ca Carbonate-Mag Hydroxide (ROLAIDS PO) Take 1-2 tablets by mouth 3 (three) times daily as needed (for heartburn/indigestion).    [provider]  carbamide peroxide (DEBROX) 6.5 % OTIC solution 5 drops 2 (two) times daily. To help clean out ears     [provider]  Cholecalciferol (VITAMIN D3) 1000 UNITS CAPS Take 1,000 Units by mouth every morning.     [provider]  diltiazem (CARDIZEM CD) 240 MG 24 hr capsule Take 1 capsule (240 mg total) by mouth daily. 08/30/17   Chipper Herb, MD  ELIQUIS 2.5 MG TABS tablet TAKE 1 TABLET TWICE DAILY 11/02/17   Chipper Herb, MD  fesoterodine (TOVIAZ) 4 MG TB24 tablet Take 1 tablet (4 mg total) by mouth daily. 08/30/17   Chipper Herb, MD  furosemide (LASIX) 40 MG tablet Take 1 tablet (40 mg total) by mouth daily. 01/15/18   Murlean Iba, MD  levothyroxine (SYNTHROID, LEVOTHROID) 25 MCG tablet Take 1 tablet (25 mcg total) by mouth daily before breakfast. 08/30/17   Chipper Herb, MD  nitroGLYCERIN (NITROSTAT) 0.4 MG SL tablet Place 1 tablet (0.4 mg  total) under the tongue every 5 (five) minutes as needed for chest pain. 06/06/16 09/20/17  Minus Breeding, MD  omeprazole (PRILOSEC) 40 MG capsule Take 1 capsule (40 mg total) by mouth daily. 08/30/17   Chipper Herb, MD  pramipexole (MIRAPEX) 1 MG tablet Take 1.5 tablets (1.5 mg total) by mouth at bedtime. 08/30/17   Chipper Herb, MD  pravastatin (PRAVACHOL) 40 MG tablet Take 1 tablet (40 mg total) by mouth daily. 08/30/17   Chipper Herb, MD  traMADol (ULTRAM) 50 MG tablet Take 1 tablet (50 mg total) by mouth every 6 (six) hours as needed for moderate pain. 10/03/16   Georganna Skeans, MD  traZODone (DESYREL) 100 MG tablet Take 1/2 tablet by mouth at bedtime if needed 01/01/18   Chipper Herb, MD    Scheduled Meds: . chlorhexidine gluconate (MEDLINE KIT)  15 mL Mouth Rinse BID  . ipratropium-albuterol  3 mL Nebulization Q6H  . mouth rinse  15 mL Mouth Rinse 10 times per day  . [START ON 01/19/2018] pantoprazole  40 mg Intravenous Q12H   Infusions: . sodium chloride    . sodium chloride    . ceFEPime (MAXIPIME) IV    . dextrose 5 % and 0.9% NaCl 50 mL/hr at 01/17/18 1500  . diltiazem (CARDIZEM) infusion 10 mg/hr  (01/17/18 1500)  . metronidazole Stopped (01/17/18 1249)  . pantoprozole (PROTONIX) infusion 8 mg/hr (01/17/18 1500)  . phenylephrine (NEO-SYNEPHRINE) Adult infusion 70 mcg/min (01/17/18 1500)  . propofol (DIPRIVAN) infusion Stopped (01/17/18 0830)   PRN Meds: sodium chloride, acetaminophen, fentaNYL (SUBLIMAZE) injection, fentaNYL (SUBLIMAZE) injection, midazolam   Allergies as of 01/15/2018 - Review Complete 01/15/2018  Allergen Reaction Noted  . Lipitor [atorvastatin] Other (See Comments) 01/15/2012  . Doxycycline Rash 12/17/2016  . Penicillins Itching 01/26/2015    Family History  Problem Relation Age of Onset  . Cancer Mother        bone marrow  . Cancer Father   . Cancer Brother        skin  . Healthy Son   . Colon cancer Neg Hx     Social History   Socioeconomic History  . Marital status: Widowed    Spouse name: Not on file  . Number of children: 1  . Years of education: Not on file  . Highest education level: Not on file  Occupational History  . Occupation: Retired   Scientific laboratory technician  . Financial resource strain: Not on file  . Food insecurity:    Worry: Not on file    Inability: Not on file  . Transportation needs:    Medical: Not on file    Non-medical: Not on file  Tobacco Use  . Smoking status: Former Smoker    Packs/day: 1.00    Types: Cigarettes    Last attempt to quit: 08/20/1978    Years since quitting: 39.4  . Smokeless tobacco: Never Used  Substance and Sexual Activity  . Alcohol use: No  . Drug use: No  . Sexual activity: Never  Lifestyle  . Physical activity:    Days per week: Not on file    Minutes per session: Not on file  . Stress: Not on file  Relationships  . Social connections:    Talks on phone: Not on file    Gets together: Not on file    Attends religious service: Not on file    Active member of club or organization: Not on file    Attends meetings of  clubs or organizations: Not on file    Relationship status: Not on file  .  Intimate partner violence:    Fear of current or ex partner: Not on file    Emotionally abused: Not on file    Physically abused: Not on file    Forced sexual activity: Not on file  Other Topics Concern  . Not on file  Social History Narrative   No caffeine     REVIEW OF SYSTEMS: Constitutional: Feeling weak and tired. ENT:  No nose bleeds Pulm: No cough currently. CV:  No palpitations, no LE edema.  No current chest pain. GU:  No hematuria, no frequency GI:  Per HPI Heme: No excessive or unusual bleeding/bruising. Transfusions: None. Neuro:  No headaches, no peripheral tingling or numbness.  No syncope. Derm:  No itching, no rash or sores.  Endocrine:  No sweats or chills.  No polyuria or dysuria Immunization: Did not ask him about recent or previous vaccinations. Travel:  None beyond local counties in last few months.    PHYSICAL EXAM: Vital signs in last 24 hours: Vitals:   01/17/18 1520 01/17/18 1530  BP: (!) 85/47 (!) 89/71  Pulse: (!) 103 94  Resp: (!) 28 (!) 23  Temp:    SpO2: 93% 93%   Wt Readings from Last 3 Encounters:  01/17/18 153 lb 10.6 oz (69.7 kg)  01/12/18 142 lb 3.2 oz (64.5 kg)  01/02/18 148 lb (67.1 kg)    General: Thin, aged, tired looking and somewhat frail appearing WM. Head: No signs of head trauma.  No facial asymmetry or swelling. Eyes: No scleral icterus.  Conjunctiva pink. Ears: Not hard of hearing. Nose: No discharge or congestion.  NG tube in place, dark but not bloody looking effluent in tubing and canister. Mouth: ET tube in place.  No blood in the mouth. Neck: No JVD Lungs: Clear bilaterally in front with good breath sounds.  No labored breathing on the vent. Heart:  RRR.  NSR in 80s on tele Abdomen: Not distended or tender.  Bowel sounds quiet.  No tinkling or tympanitic bowel sounds.  No HSM, no abdominal hernias..   Rectal: Deferred. Musc/Skeltl: Arthritic changes in the fingers. Extremities: No CCE.  Feet are warm.  Pedal  pulses 2-3+ Neurologic: Follows simple commands such as wiggling his fingers and toes.  Alert, trying to speak but unable to do so with ETT in place Skin: No open sores, rashes or significant purpura.   Intake/Output from previous day: 05/30 0701 - 05/31 0700 In: 5071.1 [I.V.:5071.1] Out: 1855 [Urine:1055; Emesis/NG output:800] Intake/Output this shift: Total I/O In: 1758.5 [I.V.:1558.5; IV Piggyback:200] Out: 460 [Urine:210; Emesis/NG output:250]  LAB RESULTS: Recent Labs    01/15/18 2327 01/16/18 1216 01/17/18 0500  WBC 15.1* 5.3 10.2  HGB 13.1 13.7 10.6*  HCT 39.8 42.0 32.5*  PLT 320 281 212   BMET Lab Results  Component Value Date   NA 144 01/17/2018   NA 142 01/15/2018   NA 146 (H) 01/13/2018   K 3.2 (L) 01/17/2018   K 3.5 01/15/2018   K 3.8 01/13/2018   CL 112 (H) 01/17/2018   CL 98 (L) 01/15/2018   CL 99 (L) 01/13/2018   CO2 21 (L) 01/17/2018   CO2 26 01/15/2018   CO2 34 (H) 01/13/2018   GLUCOSE 87 01/17/2018   GLUCOSE 116 (H) 01/15/2018   GLUCOSE 140 (H) 01/13/2018   BUN 47 (H) 01/17/2018   BUN 53 (H) 01/15/2018   BUN 27 (  H) 01/13/2018   CREATININE 2.05 (H) 01/17/2018   CREATININE 1.95 (H) 01/15/2018   CREATININE 1.84 (H) 01/13/2018   CALCIUM 7.7 (L) 01/17/2018   CALCIUM 10.3 01/15/2018   CALCIUM 10.4 (H) 01/13/2018   LFT Recent Labs    01/15/18 2327  PROT 7.2  ALBUMIN 3.6  AST 35  ALT 24  ALKPHOS 72  BILITOT 1.0   PT/INR Lab Results  Component Value Date   INR 1.06 07/18/2017   INR 1.08 10/02/2016   INR 1.20 09/13/2015   Hepatitis Panel No results for input(s): HEPBSAG, HCVAB, HEPAIGM, HEPBIGM in the last 72 hours. C-Diff No components found for: CDIFF Lipase     Component Value Date/Time   LIPASE 38 01/12/2018 0643    Drugs of Abuse  No results found for: LABOPIA, COCAINSCRNUR, LABBENZ, AMPHETMU, THCU, LABBARB   RADIOLOGY STUDIES: Dg Chest 1 View  Result Date: 01/16/2018 CLINICAL DATA:  Attempted central line right side.  EXAM: CHEST  1 VIEW COMPARISON:  01/16/2018.  CT 01/12/2018. FINDINGS: Endotracheal tube in stable position. NG tube again noted with coiled in a large hiatal hernia. Cardiomegaly. Unchanged right upper lobe and bibasilar infiltrates. No pleural effusion or pneumothorax. Air-filled loops of bowel noted. No acute bony abnormality. IMPRESSION: 1. Endotracheal tube and NG tube in stable position. NG tube is noted coiled in a hiatal hernia in stable position. 2. Persistent right upper lobe and bibasilar infiltrates. Similar finding noted on prior exam. Electronically Signed   By: Atkins   On: 01/16/2018 14:28   Dg Chest Port 1 View  Result Date: 01/17/2018 CLINICAL DATA:  Respiratory failure. EXAM: PORTABLE CHEST 1 VIEW COMPARISON:  Radiograph of Jan 16, 2018. FINDINGS: Stable cardiomediastinal silhouette. Status post coronary artery bypass graft. Endotracheal tube is in grossly good position. Left internal jugular catheter is unchanged. Distal tip of nasogastric tube is seen within hiatal hernia above the diaphragm. Stable bilateral lung opacities are noted, right greater than left concerning for multifocal pneumonia. No pneumothorax is noted. No significant pleural effusion is noted. Bony thorax is unremarkable. IMPRESSION: Distal tip of nasogastric tube is looped within hiatal hernia above the left hemidiaphragm. Stable bilateral lung opacities concerning for multifocal pneumonia. Electronically Signed   By: Marijo Conception, M.D.   On: 01/17/2018 07:58   Dg Chest Port 1 View  Result Date: 01/16/2018 CLINICAL DATA:  History of central venous line caught EXAM: PORTABLE CHEST 1 VIEW COMPARISON:  Portable chest x-ray of 01/16/2018, FINDINGS: The tip of the endotracheal tube is approximately 3.2 cm above the carina. Patchy airspace disease remains throughout the right lung and in the left upper lobe most consistent with multifocal pneumonia. Left central venous line tip overlies the upper SVC. No  pneumothorax is seen. Cardiomegaly is stable. Retrocardiac opacity is consistent with moderately large hiatal hernia or compared to CT of 01/12/2018. No bony abnormality is noted. IMPRESSION: 1. Patchy airspace disease right greater than left most consistent with multifocal pneumonia. 2. Tip of endotracheal tube 3.2 cm above the carina. 3. New left central venous line tip overlies the mid upper SVC. No pneumothorax. 4. Moderately large hiatal hernia. Electronically Signed   By: Ivar Drape M.D.   On: 01/16/2018 15:52   Dg Chest Port 1 View  Result Date: 01/16/2018 CLINICAL DATA:  Respiratory failure/hypoxia EXAM: PORTABLE CHEST 1 VIEW COMPARISON:  Study obtained earlier in the day FINDINGS: Endotracheal tube tip is 2.8 cm above the carina. Nasogastric tube tip and side port are and a  large paraesophageal hernia. No pneumothorax. There is increase in airspace consolidation in the right upper lobe. There is extensive airspace consolidation in the right base region. There is atelectatic change in the left base. There is cardiomegaly with pulmonary vascularity normal. Patient is status post coronary artery bypass grafting. There is aortic atherosclerosis. No evident bone lesions. No adenopathy by radiography apparent. IMPRESSION: Tube positions as described without pneumothorax. Note that the nasogastric tube tip and side port are in a large paraesophageal type hernia. Extensive airspace consolidation is noted throughout much of the right lung with increase in opacity in the right upper lobe compared to earlier in the day. Suspect progression of pneumonia in this area, although aspiration is a differential consideration. There is atelectatic change in the left lower lobe region, stable. There is stable cardiac prominence. There is aortic atherosclerosis. Aortic Atherosclerosis (ICD10-I70.0). Electronically Signed   By: Lowella Grip III M.D.   On: 01/16/2018 09:44   Dg Chest Port 1 View  Result Date:  01/16/2018 CLINICAL DATA:  84 y/o  M; ET tube placement. EXAM: PORTABLE CHEST 1 VIEW COMPARISON:  01/15/2018 chest radiograph. FINDINGS: Stable cardiomegaly given projection and technique. Enteric tube tip is coiling within a large hiatal hernia. Endotracheal tube tip projects 2.6 cm above the carina. Increased patchy consolidations of the mid and lower lung zones. Multilevel degenerative changes of the spine. No acute osseous abnormality is evident. IMPRESSION: 1. Endotracheal tube tip 2.6 cm above the carina. 2. Enteric tube tip coiling within large hiatal hernia. 3. Increased consolidation within mid and lower lung zones. Electronically Signed   By: Kristine Garbe M.D.   On: 01/16/2018 03:24   Dg Chest Port 1 View  Result Date: 01/15/2018 CLINICAL DATA:  Shortness of breath and vomiting. EXAM: PORTABLE CHEST 1 VIEW COMPARISON:  Jan 12, 2018 FINDINGS: A very large hiatal hernia is identified. The heart, hila, and mediastinum are unchanged. No pneumothorax. No focal infiltrate in the left lung. Mild opacity in the right mid and lower lung. No other acute abnormalities. IMPRESSION: 1. Large hiatal hernia. 2. Cardiomegaly. 3. Mild new opacity in the right mid and lower lung. Electronically Signed   By: Dorise Bullion III M.D   On: 01/15/2018 23:33     IMPRESSION:   *   Intrathoracic hiatal hernia leading to relative gastric outlet obstruction. Abundant, dark, gastric material seen at ET tube placement and suctioned via OG tube.   Protonix drip in place.  *    Normocytic anemia.  S/p 2 U PRBCs.    *     Multifocal, bilateral pneumonia.  Due to aspiration.  On cefepime, Flagyl.  *   AKI.    *   History of atrial fibrillation, on Eliquis.  A. fib with RVR on arrival yesterday currently in NSR.   PLAN:     *   EGD tomorrow.  Keep intubated for this.     Azucena Freed  01/17/2018, 3:41 PM Phone [phone]  GI ATTENDING  History, laboratories, x-rays personally reviewed.  Patient personally seen and examined. Several family members in room including son [name]. Agree with comprehensive consultation note as outlined above. Patient with complicated large diaphragmatic hernia encompassing stomach and transverse colon recent CT. Recent problems with significant nausea and vomiting. Admitted with aspiration associated respiratory compromise requiring mechanical intubation. Now in the intensive care unit. Has had coffee-ground material in the NG tube with drifting hemoglobin. As contacted by the pulmonary attending physician Dr. Elsworth Soho requesting evaluation.  The patient has been on Eliquis for a history of atrial fibrillation. This is being held. At this point, his problems are certainly secondary to his hernia. The only reasonable option is surgical repair. I am planning upper endoscopy tomorrow while he is still intubated to assess for any other significant concurrent pathology given the coffee-ground emesis. Patient is high-risk. I discussed this with the family. I do recommend general surgical consultation at this time.  Docia Chuck. Geri Seminole., M.D. [facility] Division of Gastroenterology

## 2018-01-17 NOTE — Progress Notes (Signed)
Pharmacy Antibiotic Note  [name] is a 84 y.o. male admitted on 01/15/2018 with pneumonia.   Vancomycin has been discontinued. Currently WBC is wnl. SCr has increased at 2.05 with estimated CrCl ~ 25 mL/min.    Plan: Reduce Cefepime to 1 g q24h Continue Flagyl 500 mg q8h   Height: 5\' 11"  (180.3 cm) Weight: 153 lb 10.6 oz (69.7 kg) IBW/kg (Calculated) : 75.3  Temp (24hrs), Avg:100.4 F (38 C), Min:97.5 F (36.4 C), Max:100.9 F (38.3 C)  Recent Labs  Lab 01/12/18 0643 01/13/18 0135 01/15/18 2327 01/16/18 0009 01/16/18 0355 01/16/18 1216 01/17/18 0500  WBC 7.7 9.3 15.1*  --   --  5.3 10.2  CREATININE 1.24 1.84* 1.95*  --   --   --  2.05*  LATICACIDVEN  --   --   --  4.24* 1.85  --   --     Estimated Creatinine Clearance: 24.6 mL/min (A) (by C-G formula based on SCr of 2.05 mg/dL (H)).    Allergies  Allergen Reactions  . Lipitor [Atorvastatin] Other (See Comments)    "feel bad"  . Doxycycline Rash    Redness to face and hands  . Penicillins Itching    Has patient had a PCN reaction causing immediate rash, facial/tongue/throat swelling, SOB or lightheadedness with hypotension: Yes Has patient had a PCN reaction causing severe rash involving mucus membranes or skin necrosis: No Has patient had a PCN reaction that required hospitalization No Has patient had a PCN reaction occurring within the last 10 years: No If all of the above answers are "NO", then may proceed with Cephalosporin use.    Sloan Leiter, PharmD, BCPS, BCCCP Clinical Pharmacist Clinical phone 01/17/2018 until 3:30PM [phone] After hours, please call #[phone] 01/17/2018 10:21 AM

## 2018-01-18 ENCOUNTER — Inpatient Hospital Stay (HOSPITAL_COMMUNITY): Payer: Medicare HMO

## 2018-01-18 ENCOUNTER — Encounter (HOSPITAL_COMMUNITY): Admission: EM | Disposition: E | Payer: Self-pay | Source: Home / Self Care | Attending: Pulmonary Disease

## 2018-01-18 ENCOUNTER — Encounter (HOSPITAL_COMMUNITY): Payer: Self-pay | Admitting: Internal Medicine

## 2018-01-18 DIAGNOSIS — K2901 Acute gastritis with bleeding: Secondary | ICD-10-CM

## 2018-01-18 DIAGNOSIS — J690 Pneumonitis due to inhalation of food and vomit: Secondary | ICD-10-CM

## 2018-01-18 DIAGNOSIS — K210 Gastro-esophageal reflux disease with esophagitis: Secondary | ICD-10-CM

## 2018-01-18 DIAGNOSIS — K209 Esophagitis, unspecified without bleeding: Secondary | ICD-10-CM

## 2018-01-18 DIAGNOSIS — K920 Hematemesis: Secondary | ICD-10-CM | POA: Insufficient documentation

## 2018-01-18 HISTORY — PX: ESOPHAGOGASTRODUODENOSCOPY: SHX5428

## 2018-01-18 LAB — GLUCOSE, CAPILLARY
GLUCOSE-CAPILLARY: 80 mg/dL (ref 65–99)
GLUCOSE-CAPILLARY: 70 mg/dL (ref 65–99)
GLUCOSE-CAPILLARY: 104 mg/dL — AB (ref 65–99)
Glucose-Capillary: 94 mg/dL (ref 65–99)
Glucose-Capillary: 111 mg/dL — ABNORMAL HIGH (ref 65–99)
Glucose-Capillary: 64 mg/dL — ABNORMAL LOW (ref 65–99)
Glucose-Capillary: 103 mg/dL — ABNORMAL HIGH (ref 65–99)

## 2018-01-18 LAB — CBC
HCT: 30 % — ABNORMAL LOW (ref 39.0–52.0)
HEMOGLOBIN: 9.8 g/dL — AB (ref 13.0–17.0)
MCH: 31 pg (ref 26.0–34.0)
MCHC: 32.7 g/dL (ref 30.0–36.0)
MCV: 94.9 fL (ref 78.0–100.0)
Platelets: 177 10*3/uL (ref 150–400)
RBC: 3.16 MIL/uL — AB (ref 4.22–5.81)
RDW: 14.9 % (ref 11.5–15.5)
WBC: 12.4 10*3/uL — AB (ref 4.0–10.5)

## 2018-01-18 LAB — BASIC METABOLIC PANEL
ANION GAP: 6 (ref 5–15)
BUN: 40 mg/dL — ABNORMAL HIGH (ref 6–20)
CHLORIDE: 122 mmol/L — AB (ref 101–111)
CO2: 20 mmol/L — ABNORMAL LOW (ref 22–32)
Calcium: 7.7 mg/dL — ABNORMAL LOW (ref 8.9–10.3)
Creatinine, Ser: 1.67 mg/dL — ABNORMAL HIGH (ref 0.61–1.24)
GFR calc non Af Amer: 35 mL/min — ABNORMAL LOW (ref >60)
GFR, EST AFRICAN AMERICAN: 41 mL/min — AB (ref >60)
Glucose, Bld: 95 mg/dL (ref 65–99)
POTASSIUM: 3.1 mmol/L — AB (ref 3.5–5.1)
Sodium: 148 mmol/L — ABNORMAL HIGH (ref 135–145)

## 2018-01-18 LAB — CULTURE, RESPIRATORY W GRAM STAIN: Culture: NORMAL

## 2018-01-18 LAB — MAGNESIUM: MAGNESIUM: 1.8 mg/dL (ref 1.7–2.4)

## 2018-01-18 LAB — PHOSPHORUS: PHOSPHORUS: 2.3 mg/dL — AB (ref 2.5–4.6)

## 2018-01-18 LAB — CULTURE, RESPIRATORY

## 2018-01-18 SURGERY — EGD (ESOPHAGOGASTRODUODENOSCOPY)
Anesthesia: Moderate Sedation

## 2018-01-18 MED ORDER — MAGNESIUM SULFATE 2 GM/50ML IV SOLN
2.0000 g | Freq: Once | INTRAVENOUS | Status: AC
  Administered 2018-01-18: 2 g via INTRAVENOUS
  Filled 2018-01-18: qty 50

## 2018-01-18 MED ORDER — ACETYLCYSTEINE 20 % IN SOLN
4.0000 mL | Freq: Two times a day (BID) | RESPIRATORY_TRACT | Status: AC
  Administered 2018-01-18 – 2018-01-19 (×3): 4 mL via RESPIRATORY_TRACT
  Filled 2018-01-18 (×4): qty 4

## 2018-01-18 MED ORDER — DEXTROSE 50 % IV SOLN
INTRAVENOUS | Status: AC
  Filled 2018-01-18: qty 50

## 2018-01-18 MED ORDER — POTASSIUM CHLORIDE 10 MEQ/50ML IV SOLN
10.0000 meq | INTRAVENOUS | Status: AC
  Administered 2018-01-18 (×6): 10 meq via INTRAVENOUS
  Filled 2018-01-18 (×6): qty 50

## 2018-01-18 MED ORDER — SODIUM CHLORIDE 0.9 % IV BOLUS: 500.0000 mL | Freq: Once | INTRAVENOUS | Status: DC

## 2018-01-18 MED ORDER — FENTANYL 2500MCG IN NS 250ML (10MCG/ML) PREMIX INFUSION
0.0000 ug/h | INTRAVENOUS | Status: DC
  Administered 2018-01-18: 100 ug/h via INTRAVENOUS
  Administered 2018-01-19: 175 ug/h via INTRAVENOUS
  Filled 2018-01-18 (×2): qty 250

## 2018-01-18 MED ORDER — DEXTROSE 50 % IV SOLN
1.0000 | Freq: Once | INTRAVENOUS | Status: AC
  Administered 2018-01-18: 50 mL via INTRAVENOUS

## 2018-01-18 MED ORDER — DEXTROSE 50 % IV SOLN
INTRAVENOUS | Status: AC
  Administered 2018-01-18: 25 mL
  Filled 2018-01-18: qty 50

## 2018-01-18 NOTE — Interval H&P Note (Signed)
History and Physical Interval Note:  02/08/2018 1:45 PM  [name]  has presented today for surgery, with the diagnosis of Large hiatal hernia.  Dark gastric material  The various methods of treatment have been discussed with the patient and family. After consideration of risks, benefits and other options for treatment, the patient has consented to  Procedure(s): ESOPHAGOGASTRODUODENOSCOPY (EGD) (N/A) as a surgical intervention .  The patient's history has been reviewed, patient examined, no change in status, stable for surgery.  I have reviewed the patient's chart and labs.  Questions were answered to the patient's satisfaction.     [name]

## 2018-01-18 NOTE — Progress Notes (Signed)
This note also relates to the following rows which could not be included: SpO2 - Cannot attach notes to unvalidated device data  Patients sats dropping into 80's. FIO2 increased to 100% to get sats up. Vitals are stable and sats are 93%. RT will continue to monitor.

## 2018-01-18 NOTE — Progress Notes (Signed)
RT called to patient room due to patient having a decrease in sats to 80s.  Upon arrival, patient was getting an O2 breath and sats were reading 92%.  Auscultated patient and noted patient to have fine crackles in the upper lobes.  Attempted to suction patient and was not able to pass catheter all the way down.  Took patient off of ventilator and bag lavaged patient and was able to obtain a moderate amount of thick, tan mucus plugs.  Placed patient back on ventilator and increased FIO2 to 100%.  Patient sats currently 95%.  Will continue to monitor.

## 2018-01-18 NOTE — Progress Notes (Signed)
This note also relates to the following rows which could not be included: SpO2 - Cannot attach notes to unvalidated device data  RR changed to 14 and PEEP decreased to 5 per MD order. Vitals are stable. RT will continue to monitor.

## 2018-01-18 NOTE — Progress Notes (Signed)
Report called to Aniceto Boss, RN on [place].

## 2018-01-18 NOTE — Progress Notes (Signed)
eLink Physician-Brief Progress Note Patient Name: [name] DOB: [birth year] MRN: [id number]   Date of Service  02/06/2018  HPI/Events of Note  Hypokalemia and moderately low mag  eICU Interventions  Potassium and mag replaced     Intervention Category Intermediate Interventions: Electrolyte abnormality - evaluation and management  DETERDING,ELIZABETH 01/23/2018, 5:37 AM

## 2018-01-18 NOTE — Progress Notes (Addendum)
PULMONARY / CRITICAL CARE MEDICINE   Name: [name] MRN: [id number] DOB: April 17, 1927    ADMISSION DATE:  01/15/2018 CONSULTATION DATE:  01/16/2018  REFERRING MD:  Dr. Stark Jock  CHIEF COMPLAINT:  Acute respiratory failure  BRIEF HISTORY OF PRESENT ILLNESS:   84 y/o male who had recently been discahrged for complaints due to a hiatal hernia was admitted again on 5/29 in the setting of vomiting and aspiraiton pneumonia leading to respiratory failure.  He was noted to have gastritis after admission with bloody stomach secretions.   SUBJECTIVE:  Sedated on vent No acute events overnight  VITAL SIGNS: BP (!) 113/52   Pulse 71   Temp 99.2 F (37.3 C) (Axillary)   Resp (!) 28   Ht 5\' 11"  (1.803 m)   Wt 154 lb 8.7 oz (70.1 kg)   SpO2 98%   BMI 21.55 kg/m   HEMODYNAMICS: CVP:  [4 mmHg-12 mmHg] 4 mmHg  VENTILATOR SETTINGS: Vent Mode: PRVC FiO2 (%):  [40 %-100 %] 40 % Set Rate:  [20 bmp] 20 bmp Vt Set:  [600 mL] 600 mL PEEP:  [5 cmH20-8 cmH20] 5 cmH20 Plateau Pressure:  [25 cmH20-30 cmH20] 26 cmH20  INTAKE / OUTPUT: I/O last 3 completed shifts: In: 7912.8 [I.V.:7312.8; IV [id number]:789] Out: 2260 [Urine:1660; Emesis/NG output:600]  PHYSICAL EXAMINATION:  General:  In bed on vent HENT: NCAT ETT in place PULM: CTA B, vent supported breathing CV: RRR on my exam, systolic murmur noted GI: BS+, soft, nontender MSK: normal bulk and tone Neuro: sedated on vent but wakes, nods head appropriately    LABS:  BMET Recent Labs  Lab 01/15/18 2327 01/17/18 0500 02/10/2018 0345  NA 142 144 148*  K 3.5 3.2* 3.1*  CL 98* 112* 122*  CO2 26 21* 20*  BUN 53* 47* 40*  CREATININE 1.95* 2.05* 1.67*  GLUCOSE 116* 87 95    Electrolytes Recent Labs  Lab 01/13/18 0135 01/15/18 2327 01/17/18 0500 01/21/2018 0345  CALCIUM 10.4* 10.3 7.7* 7.7*  MG 2.0  --  1.8 1.8  PHOS  --   --  3.3 2.3*    CBC Recent Labs  Lab 01/15/18 2327 01/16/18 1216 01/17/18 0500  WBC 15.1* 5.3 10.2   HGB 13.1 13.7 10.6*  HCT 39.8 42.0 32.5*  PLT 320 281 212    Coag's No results for input(s): APTT, INR in the last 168 hours.  Sepsis Markers Recent Labs  Lab 01/16/18 0009 01/16/18 0355  LATICACIDVEN 4.24* 1.85    ABG Recent Labs  Lab 01/16/18 0250 01/16/18 1625 01/17/18 0353  PHART 7.298* 7.377 7.336*  PCO2ART 49.5* 36.4 35.2  PO2ART 98.1 56.3* 52.0*    Liver Enzymes Recent Labs  Lab 01/12/18 0643 01/13/18 0135 01/15/18 2327  AST 22 28 35  ALT 15* 17 24  ALKPHOS 84 87 72  BILITOT 0.9 1.0 1.0  ALBUMIN 3.8 3.6 3.6    Cardiac Enzymes Recent Labs  Lab 01/13/18 0135 01/15/18 2327 01/17/18 1128  TROPONINI 0.07* 0.27* 0.32*    Glucose Recent Labs  Lab 01/17/18 0821 01/17/18 1120 01/17/18 1509 01/17/18 1941 01/17/18 2347 01/20/2018 0331  GLUCAP 85 72 85 97 92 94    Imaging Dg Chest Port 1 View  Result Date: 01/19/2018 CLINICAL DATA:  Acute respiratory failure EXAM: PORTABLE CHEST 1 VIEW COMPARISON:  Chest x-rays dated 01/17/2018 and 01/16/2018. FINDINGS: Endotracheal tube appears well positioned with tip above the level of the carina. LEFT IJ central line is stable in position with tip at  the level of the upper SVC. Enteric tube has been retracted into the upper mediastinum. The airspace opacities throughout both lungs are not significantly changed compared to yesterday's exam, multifocal pneumonia versus pulmonary edema pattern. No pneumothorax seen. Heart size and mediastinal contours are stable. Large hiatal hernia better demonstrated on previous exams. IMPRESSION: 1. Nasogastric tube has been pulled back with tip now at the level of the upper mediastinum. Recommend repositioning or removal. 2. Remainder of the support apparatus appears appropriately positioned. 3. Airspace opacities throughout both lungs are not significantly changed compared to yesterday's exam, compatible with multifocal pneumonia and/or pulmonary edema. 4. Large hiatal hernia better  demonstrated on previous exams. These results will be called to the ordering clinician or representative by the Radiologist Assistant, and communication documented in the PACS or zVision Dashboard. Electronically Signed   By: Franki Cabot M.D.   On: 01/21/2018 07:26     STUDIES:  5/26 CT abd > Progressive large hiatal hernia involving stomach and distal transverse colon. 3 areas of relative luminal narrowing in the stomach may result in a degree of relative obstruction and account for the patient's symptoms. Advanced right hip DJD with effusion and iliopsoas bursitis with effusion. Sigmoid diverticulosis. 5/27 echo: LVEF 60-65%, mild AS, Moderate pulmonary hypertension, mild MR  CULTURES:  12/20/2017 blood cultures x2>> 01/16/2018 sputum culture>> gram neg rods Urine strep ag neg  ANTIBIOTICS: Aztreonam , flagyl, and vancomycin given in ED 5/30  01/16/2018 vancomycin>>5/31 01/16/2018 cefepime>> 12/20/2017 Flagyl>>   SIGNIFICANT EVENTS: 5/26>5/28 admit for chest pain and vomiting felt secondary to hiatal hernia.  5/30 admit to 2104 5/31 AF-RVR >> cardizem gtt  LINES/TUBES: ETT 5/30 > 01/16/2018 NG tube>>    DISCUSSION: 84 y/o male on Eliquis who has aspiration pneumonia and presumably gastritis leading to mild hemorrhagic anemia.    ASSESSMENT / PLAN:  PULMONARY A: Acute respiratory failure with hypoxemia Aspiration pneumonia P:   Full mechanical vent support until after EGD today then start weaning VAP prevention Daily WUA/SBT Decrease PEEP  CARDIOVASCULAR A:  Mild hypotension due to sedation Atrial fibrillation Mild aortic stenosis, mild MR Pulmonary hypertension Demand ischemia P:  Tele Hold anticoagulation with bleeding Wean off neo for MAP > 65 Continue cardizem for now, hopeful that we can stop propofol and BP will improve. If not will need to change to amiodarone  RENAL A:   Mild hypernatremia Hypokalemia AKI> better P:   Replace K Monitor BMET and  UOP Replace electrolytes as needed   GASTROINTESTINAL A:   Hiatal hernia GI bleeding, not brisk P:   NPO Continue PPI EGD today   HEMATOLOGIC A:   Hemorrhagic anemia P:  Repeat CBC now Transfuse for Hgb < 7gm/dL  INFECTIOUS A:   Aspiration pneumonia P:   Continue cefepime   ENDOCRINE A:   No acute issues  P:   Monitor glucose  NEUROLOGIC A:   Sedation needs for vent synchrony P:   RASS goal: -1 PAD protocol: propofol and prn fentanyl Consider stopping propofol after EGD  FAMILY  - Updates: none bedside  - Inter-disciplinary family meet or Palliative Care meeting due by:  Day 7  My cc time 35 minutes  Roselie Awkward, MD [facility] Pager: [phone] Cell: [phone] After 3pm or if no response, call [phone]    02/14/2018, 7:46 AM

## 2018-01-18 NOTE — Op Note (Signed)
[facility] Patient Name: [name] Procedure Date : 01/27/2018 MRN: [id number] Attending MD: Docia Chuck. Henrene Pastor , MD Date of Birth: 10/27/1926 CSN: [id number] Age: 84 Admit Type: Inpatient Procedure:                Upper GI endoscopy Indications:              Coffee-ground emesis, drifting hemoglobin, complex                            hiatal hernia on CT Providers:                Docia Chuck. Henrene Pastor, MD Referring MD:             critical care medicine Medicines:                Monitored Anesthesia Care Complications:            No immediate complications. Estimated Blood Loss:     Estimated blood loss: none. Procedure:                Pre-Anesthesia Assessment:                           - Prior to the procedure, a History and Physical                            was performed, and patient medications and                            allergies were reviewed. The patient's tolerance of                            previous anesthesia was also reviewed. The risks                            and benefits of the procedure and the sedation                            options and risks were discussed with the patient.                            All questions were answered, and informed consent                            was obtained. Prior Anticoagulants: The patient has                            taken Eliquis (apixaban), last dose was 3 days                            prior to procedure. ASA Grade Assessment: III - A                            patient with severe systemic disease. After  reviewing the risks and benefits, the patient was                            deemed in satisfactory condition to undergo the                            procedure.                           After obtaining informed consent, the endoscope was                            passed under direct vision. Throughout the                            procedure, the patient's blood pressure, pulse,  and                            oxygen saturations were monitored continuously. The                            Endoscope was introduced through the mouth, and                            advanced to the second part of duodenum. The upper                            GI endoscopy was accomplished without difficulty.                            The patient tolerated the procedure well. Findings:      Esophagitis, moderate reflux changes through the distal one half.      A large hiatal hernia with torsion but no high-grade obstruction was       present. The gastric mucosa was erythematous. No erosions, ulcers, or       cancers..      The examined duodenum was normal.      The cardia and gastric fundus were normal on retroflexion. Impression:               - Reflux esophagitis.                           - Large hiatal hernia. Complex.                           - Normal examined duodenum.                           - No specimens collected. Recommendation:           1. Continue PPI                           2. Consult general surgery regarding hernia repair  Discussed with critical care medicine attending and                            the patient's son [name]. Procedure Code(s):        --- Professional ---                           [id number], Esophagogastroduodenoscopy, flexible,                            transoral; diagnostic, including collection of                            specimen(s) by brushing or washing, when performed                            (separate procedure) Diagnosis Code(s):        --- Professional ---                           K21.0, Gastro-esophageal reflux disease with                            esophagitis                           K44.9, Diaphragmatic hernia without obstruction or                            gangrene                           K92.0, Hematemesis CPT copyright 2017 [employer]. All rights reserved. The codes documented  in this report are preliminary and upon coder review may  be revised to meet current compliance requirements. Docia Chuck. Henrene Pastor, MD 01/20/2018 2:49:17 PM This report has been signed electronically. Number of Addenda: 0

## 2018-01-18 NOTE — Op Note (Signed)
NOTE:  [facility] NOT AVAILABLE  PROCEDURE: EGD INDICATION: COFFEE GROUND EMESIS, DRIFTING HG, LARGE COMPLEX DIAPHRAGMATIC HERNIA ON CT   FINDINGS: 1. REFLUX ESOPHAGITIS - MODERATE 2. LARGE COMPLEX HIATAL HERNIA WITH TORSION BUT NO HIGH GRADE OBSTRUCTION 3. CHRONIC GASTRITIS WITHOUT ULCER OR BLEEDING 4. NORMAL DUODENUM   IMPRESSION 1. AS ABOVE  RECOMMENDATIONS; 1. CONTINUE PPI INDEFINITELY 2. GENERAL SURGICAL CONSULTATION RE COMPLEX HIATAL HERNIA REPAIR  DISCUSSED WITH CCM AS WELL SON WES (BY PHONE)   Docia Chuck. Geri Seminole., M.D. [facility] Division of Gastroenterology

## 2018-01-19 ENCOUNTER — Inpatient Hospital Stay (HOSPITAL_COMMUNITY): Payer: Medicare HMO

## 2018-01-19 DIAGNOSIS — J9602 Acute respiratory failure with hypercapnia: Secondary | ICD-10-CM

## 2018-01-19 DIAGNOSIS — K209 Esophagitis, unspecified: Secondary | ICD-10-CM

## 2018-01-19 LAB — GLUCOSE, CAPILLARY
GLUCOSE-CAPILLARY: 98 mg/dL (ref 65–99)
GLUCOSE-CAPILLARY: 79 mg/dL (ref 65–99)
GLUCOSE-CAPILLARY: 84 mg/dL (ref 65–99)
GLUCOSE-CAPILLARY: 78 mg/dL (ref 65–99)
Glucose-Capillary: 71 mg/dL (ref 65–99)
Glucose-Capillary: 80 mg/dL (ref 65–99)
Glucose-Capillary: 84 mg/dL (ref 65–99)
Glucose-Capillary: 86 mg/dL (ref 65–99)

## 2018-01-19 LAB — CBC
HEMATOCRIT: 34.4 % — AB (ref 39.0–52.0)
Hemoglobin: 10.7 g/dL — ABNORMAL LOW (ref 13.0–17.0)
MCH: 30.7 pg (ref 26.0–34.0)
MCHC: 31.1 g/dL (ref 30.0–36.0)
MCV: 98.6 fL (ref 78.0–100.0)
PLATELETS: 177 10*3/uL (ref 150–400)
RBC: 3.49 MIL/uL — ABNORMAL LOW (ref 4.22–5.81)
RDW: 15.3 % (ref 11.5–15.5)
WBC: 15.1 10*3/uL — AB (ref 4.0–10.5)

## 2018-01-19 LAB — BASIC METABOLIC PANEL
ANION GAP: 5 (ref 5–15)
BUN: 36 mg/dL — ABNORMAL HIGH (ref 6–20)
CHLORIDE: 125 mmol/L — AB (ref 101–111)
CO2: 22 mmol/L (ref 22–32)
CREATININE: 1.58 mg/dL — AB (ref 0.61–1.24)
Calcium: 8.3 mg/dL — ABNORMAL LOW (ref 8.9–10.3)
GFR calc non Af Amer: 37 mL/min — ABNORMAL LOW (ref >60)
GFR, EST AFRICAN AMERICAN: 43 mL/min — AB (ref >60)
Glucose, Bld: 94 mg/dL (ref 65–99)
POTASSIUM: 4.6 mmol/L (ref 3.5–5.1)
Sodium: 152 mmol/L — ABNORMAL HIGH (ref 135–145)

## 2018-01-19 MED ORDER — BISACODYL 10 MG RE SUPP
10.0000 mg | Freq: Every day | RECTAL | Status: DC | PRN
  Administered 2018-01-19 – 2018-01-23 (×2): 10 mg via RECTAL
  Filled 2018-01-19 (×2): qty 1

## 2018-01-19 MED ORDER — PRO-STAT SUGAR FREE PO LIQD
30.0000 mL | Freq: Two times a day (BID) | ORAL | Status: DC
  Administered 2018-01-19 – 2018-01-20 (×3): 30 mL
  Filled 2018-01-19 (×2): qty 30

## 2018-01-19 MED ORDER — AMIODARONE HCL IN DEXTROSE 360-4.14 MG/200ML-% IV SOLN
60.0000 mg/h | INTRAVENOUS | Status: AC
  Administered 2018-01-19 (×2): 60 mg/h via INTRAVENOUS
  Filled 2018-01-19: qty 200

## 2018-01-19 MED ORDER — AMIODARONE LOAD VIA INFUSION
150.0000 mg | Freq: Once | INTRAVENOUS | Status: AC
  Administered 2018-01-19: 150 mg via INTRAVENOUS
  Filled 2018-01-19: qty 83.34

## 2018-01-19 MED ORDER — ACETYLCYSTEINE 20 % IN SOLN
4.0000 mL | Freq: Two times a day (BID) | RESPIRATORY_TRACT | Status: AC
  Administered 2018-01-20 – 2018-01-22 (×6): 4 mL via RESPIRATORY_TRACT
  Filled 2018-01-19 (×6): qty 4

## 2018-01-19 MED ORDER — AMIODARONE HCL IN DEXTROSE 360-4.14 MG/200ML-% IV SOLN
30.0000 mg/h | INTRAVENOUS | Status: DC
  Administered 2018-01-19 – 2018-01-24 (×10): 30 mg/h via INTRAVENOUS
  Filled 2018-01-19 (×9): qty 200

## 2018-01-19 MED ORDER — FUROSEMIDE 10 MG/ML IJ SOLN
40.0000 mg | Freq: Four times a day (QID) | INTRAMUSCULAR | Status: AC
  Administered 2018-01-19 (×2): 40 mg via INTRAVENOUS
  Filled 2018-01-19 (×2): qty 4

## 2018-01-19 MED ORDER — VITAL HIGH PROTEIN PO LIQD
1000.0000 mL | ORAL | Status: DC
  Administered 2018-01-19: 1000 mL

## 2018-01-19 NOTE — Progress Notes (Signed)
Patient had episode of desaturation into the 80s. RT attempted recruitment maneuver, however, patient did not tolerate. FIO2 increased to 60% on the vent. Patient sats to 93%. RT will continue to monitor.

## 2018-01-19 NOTE — Progress Notes (Signed)
Recruitment maneuver performed at this time, pt tolerated well, no complications. RT to monitor as needed.

## 2018-01-19 NOTE — Progress Notes (Signed)
16mls of Fentanyl, from bag,  wasted in sink with Loma Sousa, Therapist, sports,

## 2018-01-19 NOTE — Progress Notes (Signed)
Concerned re:  Patient's increasing heartrate over past 5-6 hours.  Rate up to 140s with soft b/p of 08'X-44'Y systolic.  Dr. Lake Bells notified and order for Amiodarone gtt received.  Bolus dose with following maintenance IV gtt initiated.  Patient converted to NSR with rate in 70s approximately 40 minutes later.  Vital HP also initiated at 10 ml/hour due to placement of NG tube in fundus of large hiatal hernia as per MD.  Will continue to monitor.

## 2018-01-19 NOTE — Progress Notes (Signed)
Pharmacy Antibiotic Note  [name] is a 84 y.o. male admitted on 01/15/2018 with pneumonia.  Cx currently negative, still intubated  Plan: Cefepime 1 g q24 F/U LOT - consider 5 - 7 d  Height: 5\' 11"  (180.3 cm) Weight: 162 lb 14.7 oz (73.9 kg) IBW/kg (Calculated) : 75.3  Temp (24hrs), Avg:99.6 F (37.6 C), Min:98.1 F (36.7 C), Max:100.2 F (37.9 C)  Recent Labs  Lab 01/13/18 0135 01/15/18 2327 01/16/18 0009 01/16/18 0355 01/16/18 1216 01/17/18 0500 01/25/2018 0345 02/11/2018 0844 01/19/18 0419  WBC 9.3 15.1*  --   --  5.3 10.2  --  12.4* 15.1*  CREATININE 1.84* 1.95*  --   --   --  2.05* 1.67*  --   --   LATICACIDVEN  --   --  4.24* 1.85  --   --   --   --   --     Estimated Creatinine Clearance: 32 mL/min (A) (by C-G formula based on SCr of 1.67 mg/dL (H)).    Allergies  Allergen Reactions  . Lipitor [Atorvastatin] Other (See Comments)    "feel bad"  . Doxycycline Rash    Redness to face and hands  . Penicillins Itching    Has patient had a PCN reaction causing immediate rash, facial/tongue/throat swelling, SOB or lightheadedness with hypotension: Yes Has patient had a PCN reaction causing severe rash involving mucus membranes or skin necrosis: No Has patient had a PCN reaction that required hospitalization No Has patient had a PCN reaction occurring within the last 10 years: No If all of the above answers are "NO", then may proceed with Cephalosporin use.    Levester Fresh, PharmD, BCPS, BCCCP Clinical Pharmacist Clinical phone for 01/19/2018 from 0830 - 2100: [phone] If after 2100, please call main pharmacy at: [phone] 01/19/2018 9:50 AM

## 2018-01-19 NOTE — Progress Notes (Signed)
PULMONARY / CRITICAL CARE MEDICINE   Name: [name] MRN: [id number] DOB: 1927/02/02    ADMISSION DATE:  01/15/2018 CONSULTATION DATE:  01/16/2018  REFERRING MD:  Dr. Stark Jock  CHIEF COMPLAINT:  Acute respiratory failure  BRIEF HISTORY OF PRESENT ILLNESS:   84 y/o male who had recently been discahrged for complaints due to a hiatal hernia was admitted again on 5/29 in the setting of vomiting and aspiraiton pneumonia leading to respiratory failure.  He was noted to have gastritis after admission with bloody stomach secretions.   SUBJECTIVE:  Yesterday his EGD showed reflux esophagitis, large complex hiatal hernia with torsion but no high grade obstruction, chronic gastritis without ulcer or bleeding, normal duodenum  VITAL SIGNS: BP (!) 106/56   Pulse 71   Temp 99.3 F (37.4 C)   Resp 14   Ht 5\' 11"  (1.803 m)   Wt 162 lb 14.7 oz (73.9 kg)   SpO2 96%   BMI 22.72 kg/m   HEMODYNAMICS: CVP:  [1 mmHg-17 mmHg] 6 mmHg  VENTILATOR SETTINGS: Vent Mode: PRVC FiO2 (%):  [40 %-100 %] 70 % Set Rate:  [14 bmp] 14 bmp Vt Set:  [600 mL] 600 mL PEEP:  [5 cmH20] 5 cmH20 Plateau Pressure:  [20 cmH20-30 cmH20] 21 cmH20  INTAKE / OUTPUT: I/O last 3 completed shifts: In: 5136.2 [I.V.:4086.2; IV Piggyback:1050] Out: 1960 [Urine:1660; Emesis/NG output:300]  PHYSICAL EXAMINATION:  General:  In bed on vent HENT: NCAT ETT in place PULM: Rhonchi, crackles bases B, vent supported breathing CV: RRR, systolic murmur noted GI: BS+, soft, nontender Derm: anasarca noted Neuro: sedated on vent  LABS:  BMET Recent Labs  Lab 01/15/18 2327 01/17/18 0500 01/19/2018 0345  NA 142 144 148*  K 3.5 3.2* 3.1*  CL 98* 112* 122*  CO2 26 21* 20*  BUN 53* 47* 40*  CREATININE 1.95* 2.05* 1.67*  GLUCOSE 116* 87 95    Electrolytes Recent Labs  Lab 01/13/18 0135 01/15/18 2327 01/17/18 0500 01/28/2018 0345  CALCIUM 10.4* 10.3 7.7* 7.7*  MG 2.0  --  1.8 1.8  PHOS  --   --  3.3 2.3*     CBC Recent Labs  Lab 01/17/18 0500 02/16/2018 0844 01/19/18 0419  WBC 10.2 12.4* 15.1*  HGB 10.6* 9.8* 10.7*  HCT 32.5* 30.0* 34.4*  PLT 212 177 177    Coag's No results for input(s): APTT, INR in the last 168 hours.  Sepsis Markers Recent Labs  Lab 01/16/18 0009 01/16/18 0355  LATICACIDVEN 4.24* 1.85    ABG Recent Labs  Lab 01/16/18 0250 01/16/18 1625 01/17/18 0353  PHART 7.298* 7.377 7.336*  PCO2ART 49.5* 36.4 35.2  PO2ART 98.1 56.3* 52.0*    Liver Enzymes Recent Labs  Lab 01/13/18 0135 01/15/18 2327  AST 28 35  ALT 17 24  ALKPHOS 87 72  BILITOT 1.0 1.0  ALBUMIN 3.6 3.6    Cardiac Enzymes Recent Labs  Lab 01/13/18 0135 01/15/18 2327 01/17/18 1128  TROPONINI 0.07* 0.27* 0.32*    Glucose Recent Labs  Lab 01/25/2018 1931 02/05/2018 2051 02/12/2018 2328 01/19/18 0110 01/19/18 0314 01/19/18 0747  GLUCAP 70 104* 71 98 80 79    Imaging Dg Abd Portable 1v  Result Date: 01/19/2018 CLINICAL DATA:  OG tube placement. EXAM: PORTABLE ABDOMEN - 1 VIEW COMPARISON:  CT abdomen dated 01/12/2018. FINDINGS: The OG tube is loosely coiled within the patient's hiatal hernia. Stomach appears to be less distended than on the recent CT. Overall bowel gas pattern appears nonobstructive.  Moderate amount of stool within the RIGHT and LEFT colon. IMPRESSION: OG tube is loosely coiled within the patient's herniated stomach fundus (hiatal hernia). Stomach appears to be less distended than on the recent CT of 01/12/2018. Electronically Signed   By: Franki Cabot M.D.   On: 01/19/2018 08:30     STUDIES:  5/26 CT abd > Progressive large hiatal hernia involving stomach and distal transverse colon. 3 areas of relative luminal narrowing in the stomach may result in a degree of relative obstruction and account for the patient's symptoms. Advanced right hip DJD with effusion and iliopsoas bursitis with effusion. Sigmoid diverticulosis. 5/27 echo: LVEF 60-65%, mild AS, Moderate  pulmonary hypertension, mild MR 6/1 EGD: EGD showed reflux esophagitis, large complex hiatal hernia with torsion but no high grade obstruction, chronic gastritis without ulcer or bleeding, normal duodenum  CULTURES:  12/20/2017 blood cultures x2>> 01/16/2018 sputum culture>> gram pos rods Urine strep ag neg  ANTIBIOTICS: Aztreonam , flagyl, and vancomycin given in ED 5/30  01/16/2018 vancomycin>>5/31 01/16/2018 cefepime>> 12/20/2017 Flagyl>>   SIGNIFICANT EVENTS: 5/26>5/28 admit for chest pain and vomiting felt secondary to hiatal hernia.  5/30 admit to 2104 5/31 AF-RVR >> cardizem gtt  LINES/TUBES: ETT 5/30 > 01/16/2018 NG tube>>   DISCUSSION: 84 y/o male on Eliquis who has aspiration pneumonia and presumably gastritis leading to mild hemorrhagic anemia.  He was found to have a large complicated hiatal hernia on 6/2 which needs surgical correction after he recovers from his aspiration pneumonia.  ASSESSMENT / PLAN:  PULMONARY A: Acute respiratory failure with hypoxemia Aspiration pneumonia Likey acute pulmonary edema P:   Continue pulm toilette Lasix today Full mechanical vent support VAP prevention Daily WUA/SBT   CARDIOVASCULAR A:  Mild hypotension due to sedation Atrial fibrillation Mild aortic stenosis, mild MR Pulmonary hypertension Demand ischemia P:  Tele Wean off neo for map > 65 May need amiodarone if afib with RVR again  RENAL A:   Mild hypernatremia Hypokalemia AKI> better P:   Monitor BMET and UOP Replace electrolytes as needed  GASTROINTESTINAL A:   Hiatal hernia> large, complex, ideally needs surgical correction Gastritis, no bleeding or ulcer GI bleeding, not brisk P:   Will need general surgery consultation when aspiration pneumonia better Continue PPI Start tube feeding if not extubated today  HEMATOLOGIC A:   Hemorrhagic anemia P:  Monitor for bleeding Transfuse if Hgb < 7gm/dL  INFECTIOUS A:   Aspiration pneumonia P:    Continue Cefepime   ENDOCRINE A:   No acute issues  P:   Monitor glucose  NEUROLOGIC A:   Sedation needs for vent synchrony P:   RASS goal -1 PAD protocol: fentanyl infusion  FAMILY  - Updates: called son [name] and gave him an update today   - Inter-disciplinary family meet or Palliative Care meeting due by:  Day 7  My cc time 31 minutes  Roselie Awkward, MD [place] Pager: [phone] Cell: [phone] After 3pm or if no response, call [phone]    01/19/2018, 8:36 AM

## 2018-01-20 ENCOUNTER — Encounter (HOSPITAL_COMMUNITY): Payer: Self-pay | Admitting: Internal Medicine

## 2018-01-20 ENCOUNTER — Inpatient Hospital Stay (HOSPITAL_COMMUNITY): Payer: Medicare HMO

## 2018-01-20 LAB — CBC WITH DIFFERENTIAL/PLATELET
Basophils Absolute: 0 10*3/uL (ref 0.0–0.1)
Basophils Relative: 0 %
EOS ABS: 0 10*3/uL (ref 0.0–0.7)
Eosinophils Relative: 0 %
HEMATOCRIT: 38.4 % — AB (ref 39.0–52.0)
Hemoglobin: 11.6 g/dL — ABNORMAL LOW (ref 13.0–17.0)
LYMPHS ABS: 0.3 10*3/uL — AB (ref 0.7–4.0)
LYMPHS PCT: 2 %
MCH: 30.5 pg (ref 26.0–34.0)
MCHC: 30.2 g/dL (ref 30.0–36.0)
MCV: 101.1 fL — ABNORMAL HIGH (ref 78.0–100.0)
MONOS PCT: 2 %
Monocytes Absolute: 0.3 10*3/uL (ref 0.1–1.0)
NEUTROS ABS: 15 10*3/uL — AB (ref 1.7–7.7)
Neutrophils Relative %: 96 %
Platelets: 125 10*3/uL — ABNORMAL LOW (ref 150–400)
RBC: 3.8 MIL/uL — AB (ref 4.22–5.81)
RDW: 15.5 % (ref 11.5–15.5)
WBC: 15.6 10*3/uL — AB (ref 4.0–10.5)

## 2018-01-20 LAB — GLUCOSE, CAPILLARY
GLUCOSE-CAPILLARY: 89 mg/dL (ref 65–99)
GLUCOSE-CAPILLARY: 86 mg/dL (ref 65–99)
Glucose-Capillary: 87 mg/dL (ref 65–99)
Glucose-Capillary: 123 mg/dL — ABNORMAL HIGH (ref 65–99)
Glucose-Capillary: 102 mg/dL — ABNORMAL HIGH (ref 65–99)
Glucose-Capillary: 80 mg/dL (ref 65–99)

## 2018-01-20 LAB — BASIC METABOLIC PANEL
ANION GAP: 4 — AB (ref 5–15)
BUN: 52 mg/dL — ABNORMAL HIGH (ref 6–20)
CALCIUM: 8.7 mg/dL — AB (ref 8.9–10.3)
CO2: 23 mmol/L (ref 22–32)
Chloride: 127 mmol/L — ABNORMAL HIGH (ref 101–111)
Creatinine, Ser: 2.32 mg/dL — ABNORMAL HIGH (ref 0.61–1.24)
GFR calc Af Amer: 27 mL/min — ABNORMAL LOW (ref >60)
GFR calc non Af Amer: 23 mL/min — ABNORMAL LOW (ref >60)
GLUCOSE: 98 mg/dL (ref 65–99)
POTASSIUM: 4.6 mmol/L (ref 3.5–5.1)
SODIUM: 154 mmol/L — AB (ref 135–145)

## 2018-01-20 MED ORDER — SODIUM CHLORIDE 0.9 % IV BOLUS
1000.0000 mL | Freq: Once | INTRAVENOUS | Status: AC
  Administered 2018-01-20: 1000 mL via INTRAVENOUS

## 2018-01-20 MED ORDER — FREE WATER
200.0000 mL | Freq: Four times a day (QID) | Status: DC
  Administered 2018-01-20 – 2018-01-21 (×3): 200 mL

## 2018-01-20 MED ORDER — DILTIAZEM HCL 30 MG PO TABS
30.0000 mg | ORAL_TABLET | Freq: Four times a day (QID) | ORAL | Status: DC
  Administered 2018-01-20: 30 mg via ORAL
  Filled 2018-01-20 (×2): qty 1

## 2018-01-20 MED ORDER — MIDAZOLAM HCL 2 MG/2ML IJ SOLN
2.0000 mg | INTRAMUSCULAR | Status: DC | PRN
  Administered 2018-01-21: 2 mg via INTRAVENOUS
  Filled 2018-01-20: qty 2

## 2018-01-20 MED ORDER — VITAL AF 1.2 CAL PO LIQD
1000.0000 mL | ORAL | Status: DC
  Administered 2018-01-20 (×2): 1000 mL

## 2018-01-20 MED FILL — Medication: Qty: 1 | Status: AC

## 2018-01-20 NOTE — Progress Notes (Signed)
Pt. HR sustaining in the 130s and BP not consistent w/ SBP ranging from 70s-110s. Pt.'s BP increases upon arousal but decreases when sleeping. Dr. Lamonte Sakai called and updated about situation. Verbal orders given to discontinue PO cardizem.

## 2018-01-20 NOTE — Progress Notes (Signed)
eLink Physician-Brief Progress Note Patient Name: [name] DOB: September 22, 1926 MRN: [id number]   Date of Service  01/20/2018  HPI/Events of Note  Sinus Tachycardia - HR = 129.  LVEF = 60% to 65%  eICU Interventions  Will order: 1. Bolus with 0.9 NaCl 1 liter IV over 1 hour now.  2. Be sure that pain is well controlled and fever treated if appropriate.      Intervention Category Major Interventions: Arrhythmia - evaluation and management  Ashiah Karpowicz [place] 01/20/2018, 6:19 AM

## 2018-01-20 NOTE — Progress Notes (Signed)
Nutrition Follow-up / Consult  DOCUMENTATION CODES:   Not applicable  INTERVENTION:   Change TF regimen to better meet estimated nutrition needs:  Vital AF 1.2 at 20 ml/h, increase by 10 ml every 4 hours to goal rate of 60 ml/h (1440 ml per day)  Provides 1728 kcal, 108 gm protein, 1168 ml free water daily  NUTRITION DIAGNOSIS:   Inadequate oral intake related to inability to eat as evidenced by NPO status.  Ongoing  GOAL:   Patient will meet greater than or equal to 90% of their needs  Met with new TF regimen  MONITOR:   Vent status, TF tolerance, Labs, I & O's  REASON FOR ASSESSMENT:   Consult Enteral/tube feeding initiation and management  ASSESSMENT:   84 yo male with PMH of prostate cancer, GERD, HTN, HLD, CAD, hypothyroidism, CKD, and A fib & flutter who was admitted on 5/29 with respiratory failure, aspiration PNA, suspected GIB.  Discussed patient in ICU rounds and with RN today. S/P EGD 6/1. Found to have a large complicated hiatal hernia with torsion, but no obstruction that will eventually need surgical intervention when medical status allows. Received MD Consult for TF initiation and management. TF was initiated on 6/2. Currently receiving Vital High Protein via OGT at 10 ml/h (240 ml/day) to provide 240 kcals, 21 gm protein, 201 ml free water daily.  Free water flushes 200 ml eery 6 hours. Tolerating trickle TF well. Patient is currently intubated on ventilator support MV: 10.2 L/min Temp (24hrs), Avg:99 F (37.2 C), Min:98.2 F (36.8 C), Max:99.5 F (37.5 C)  Propofol: none Labs reviewed. Sodium 154 (H) CBG's: [id number] Medications reviewed and include neo-synephrine.   NUTRITION - FOCUSED PHYSICAL EXAM:    Most Recent Value  Orbital Region  No depletion  Upper Arm Region  No depletion  Thoracic and Lumbar Region  Unable to assess  Buccal Region  Unable to assess  Temple Region  Mild depletion  Clavicle Bone Region  Mild depletion   Clavicle and Acromion Bone Region  Moderate depletion  Scapular Bone Region  Unable to assess  Dorsal Hand  Unable to assess  Patellar Region  Mild depletion  Anterior Thigh Region  Mild depletion  Posterior Calf Region  Mild depletion  Edema (RD Assessment)  None  Hair  Reviewed  Eyes  Unable to assess  Mouth  Unable to assess  Skin  Reviewed  Nails  Unable to assess       Diet Order:   Diet Order           Diet NPO time specified  Diet effective now          EDUCATION NEEDS:   No education needs have been identified at this time  Skin:  Skin Assessment: Skin Integrity Issues: Skin Integrity Issues:: Stage I Stage I: coccyx  Last BM:  unknown  Height:   Ht Readings from Last 1 Encounters:  01/15/18 _0  (1.803 m)    Weight:   Wt Readings from Last 1 Encounters:  01/20/18 157 lb 6.5 oz (71.4 kg)    Ideal Body Weight:  78.2 kg  BMI:  Body mass index is 21.95 kg/m.  Estimated Nutritional Needs:   Kcal:  6438  Protein:  100-115 gm  Fluid:  1.8-2 L    Molli Barrows, RD, LDN, [place] Pager [phone] After Hours Pager [phone]

## 2018-01-20 NOTE — Progress Notes (Signed)
Pt. UOP around 81ml/hr. Dr. Lamonte Sakai made aware. No new orders at this time.

## 2018-01-20 NOTE — Progress Notes (Addendum)
PULMONARY / CRITICAL CARE MEDICINE   Name: [name] MRN: [id number] DOB: May 02, 1927    ADMISSION DATE:  01/15/2018 CONSULTATION DATE:  01/16/2018  REFERRING MD:  Dr. Stark Jock  CHIEF COMPLAINT:  Acute respiratory failure  BRIEF HISTORY OF PRESENT ILLNESS:   84 y/o male who had recently been discahrged for complaints due to a hiatal hernia was admitted again on 5/29 in the setting of vomiting and aspiraiton pneumonia leading to respiratory failure.  He was noted to have gastritis after admission with bloody stomach secretions.  Large complicated hiatal hernia noted on EGD, associated gastritis  SUBJECTIVE:  Desaturation overnight, required increase FiO2 to 60%  VITAL SIGNS: BP (!) 122/104   Pulse (!) 122   Temp 98.2 F (36.8 C)   Resp (!) 22   Ht 5\' 11"  (1.803 m)   Wt 71.4 kg (157 lb 6.5 oz)   SpO2 90%   BMI 21.95 kg/m    HEMODYNAMICS: CVP:  [3 mmHg-7 mmHg] 3 mmHg  VENTILATOR SETTINGS: Vent Mode: PRVC FiO2 (%):  [50 %-70 %] 60 % Set Rate:  [14 bmp] 14 bmp Vt Set:  [600 mL] 600 mL PEEP:  [5 cmH20] 5 cmH20 Plateau Pressure:  [22 cmH20-25 cmH20] 23 cmH20  INTAKE / OUTPUT: I/O last 3 completed shifts: In: 3077.9 [I.V.:2397.9; NG/GT:180; IV Piggyback:500] Out: 66 [Urine:1825; Emesis/NG output:50]  PHYSICAL EXAMINATION:  General: Elderly ill-appearing man, ventilated HENT: No oral lesions, endotracheal tube in place PULM: Some inspiratory rhonchi bilaterally at the bases, otherwise clear CV: Irregular, 3 out of 6 systolic murmur GI: Soft, nontender, positive bowel sounds Derm: No rash, bilateral lower extremity edema Neuro: Awake, nods to questions, follows commands  LABS:  BMET Recent Labs  Lab 01/22/2018 0345 01/19/18 0950 01/20/18 0441  NA 148* 152* 154*  K 3.1* 4.6 4.6  CL 122* 125* 127*  CO2 20* 22 23  BUN 40* 36* 52*  CREATININE 1.67* 1.58* 2.32*  GLUCOSE 95 94 98    Electrolytes Recent Labs  Lab 01/17/18 0500 01/23/2018 0345 01/19/18 0950  01/20/18 0441  CALCIUM 7.7* 7.7* 8.3* 8.7*  MG 1.8 1.8  --   --   PHOS 3.3 2.3*  --   --     CBC Recent Labs  Lab 02/14/2018 0844 01/19/18 0419 01/20/18 0441  WBC 12.4* 15.1* 15.6*  HGB 9.8* 10.7* 11.6*  HCT 30.0* 34.4* 38.4*  PLT 177 177 125*    Coag's No results for input(s): APTT, INR in the last 168 hours.  Sepsis Markers Recent Labs  Lab 01/16/18 0009 01/16/18 0355  LATICACIDVEN 4.24* 1.85    ABG Recent Labs  Lab 01/16/18 0250 01/16/18 1625 01/17/18 0353  PHART 7.298* 7.377 7.336*  PCO2ART 49.5* 36.4 35.2  PO2ART 98.1 56.3* 52.0*    Liver Enzymes Recent Labs  Lab 01/15/18 2327  AST 35  ALT 24  ALKPHOS 72  BILITOT 1.0  ALBUMIN 3.6    Cardiac Enzymes Recent Labs  Lab 01/15/18 2327 01/17/18 1128  TROPONINI 0.27* 0.32*    Glucose Recent Labs  Lab 01/19/18 1153 01/19/18 1549 01/19/18 1933 01/19/18 2347 01/20/18 0335 01/20/18 0718  GLUCAP 84 78 84 86 89 86    Imaging Dg Chest Port 1 View  Result Date: 01/20/2018 CLINICAL DATA:  Hypoxia EXAM: PORTABLE CHEST 1 VIEW COMPARISON:  January 18, 2018 FINDINGS: Endotracheal tube tip is 1.8 cm above the carina. Central catheter tip is in the superior vena cava. Nasogastric tube tip and side port are within a sizable  paraesophageal hernia. No pneumothorax. There is widespread interstitial and alveolar opacity bilaterally, slightly increased compared to most recent study. Mild cardiomegaly is stable. The pulmonary vascularity is within normal limits. No evident adenopathy. No bone lesions. IMPRESSION: Tube and catheter positions as described without pneumothorax. Widespread interstitial and alveolar opacity remains. Suspect a degree of pulmonary edema with likely superimposed pneumonia in the right upper and lower lobe regions as well as potentially in the left base. Large paraesophageal hernia again noted. Stable cardiac prominence. Electronically Signed   By: Lowella Grip III M.D.   On: 01/20/2018 07:04      STUDIES:  5/26 CT abd > Progressive large hiatal hernia involving stomach and distal transverse colon. 3 areas of relative luminal narrowing in the stomach may result in a degree of relative obstruction and account for the patient's symptoms. Advanced right hip DJD with effusion and iliopsoas bursitis with effusion. Sigmoid diverticulosis. 5/27 echo: LVEF 60-65%, mild AS, Moderate pulmonary hypertension, mild MR 6/1 EGD: EGD showed reflux esophagitis, large complex hiatal hernia with torsion but no high grade obstruction, chronic gastritis without ulcer or bleeding, normal duodenum  CULTURES:  12/20/2017 blood cultures x2>> 01/16/2018 sputum culture>> normal flora Urine strep ag >> neg  ANTIBIOTICS: Aztreonam , flagyl, and vancomycin given in ED 5/30  01/16/2018 vancomycin>>5/31 01/16/2018 cefepime>> 12/20/2017 Flagyl>>   SIGNIFICANT EVENTS: 5/26>5/28 admit for chest pain and vomiting felt secondary to hiatal hernia.  5/30 admit to 2104 5/31 AF-RVR >> cardizem gtt  LINES/TUBES: ETT 5/30 > 01/16/2018 NG tube>>   DISCUSSION: 84 y/o male on Eliquis who has aspiration pneumonia and presumably gastritis leading to mild hemorrhagic anemia.  He was found to have a large complicated hiatal hernia on 6/2 which needs surgical correction after he recovers from his aspiration pneumonia.  ASSESSMENT / PLAN:  PULMONARY A: Acute respiratory failure with hypoxemia Aspiration pneumonia Likely acute pulmonary edema, possible ALI P:   Work to wean FiO2 6/3 Would benefit from diuresis although note acute renal failure, hypernatremia preclude at this time, was given 6/2 Not in a position to perform spontaneous breathing trial given ventilator needs 6/3 VAP prevention   CARDIOVASCULAR A:  Mild hypotension due to sedation Atrial fibrillation Mild aortic stenosis, mild MR Pulmonary hypertension Demand ischemia P:  Telemetry Phenylephrine weaned to off as sedation lightened Amiodarone  drip initiated, continue He had been on a diltiazem drip earlier in the course, consider add back his enteral diltiazem slowly if we believe his blood pressure will tolerate   RENAL A:   Mild hypernatremia Hypokalemia AKI, progressive last 24 hours after diuresis P:   Follow urine output, BMP Defer diuresis on 6/3 and follow Replace electrolytes as indicated Start low-dose free water 6/3  GASTROINTESTINAL A:   Hiatal hernia> large, complex, ideally needs surgical correction Gastritis, no bleeding or ulcer GI bleeding, not brisk P:   He will require general surgery evaluation for more definitive therapy for his hiatal hernia.  Not in a position to do so right now, need to get him through this critical illness and assess his suitability for procedure. Continue PPI Continue tube feeding and add free water 6/3  HEMATOLOGIC A:   Hemorrhagic anemia P:  Follow CBC, watch for any evidence active bleeding from his gastritis Transfuse if Hgb < 7  INFECTIOUS A:   Aspiration pneumonia/ HCAP P:   Empiric cefepime, day #5 on 6/3   ENDOCRINE A:   No acute issues  P:   Follow glucose on tube feeding  NEUROLOGIC A:   Sedation needs for vent synchrony P:   RASS goal -1 PAD protocol, intermittent fentanyl, intermittent Versed  FAMILY  - Updates: son was called 6/2   Independent critical care time 29 minutes   Baltazar Apo, MD, PhD 01/20/2018, 9:16 AM [facility] [phone] or if no answer [phone]

## 2018-01-21 ENCOUNTER — Inpatient Hospital Stay (HOSPITAL_COMMUNITY): Payer: Medicare HMO

## 2018-01-21 ENCOUNTER — Ambulatory Visit: Payer: Medicare HMO | Admitting: Family Medicine

## 2018-01-21 ENCOUNTER — Telehealth: Payer: Self-pay | Admitting: Family Medicine

## 2018-01-21 LAB — COMPREHENSIVE METABOLIC PANEL WITH GFR
ALT: 13 U/L — ABNORMAL LOW (ref 17–63)
AST: 19 U/L (ref 15–41)
Albumin: 1.5 g/dL — ABNORMAL LOW (ref 3.5–5.0)
Alkaline Phosphatase: 52 U/L (ref 38–126)
Anion gap: 10 (ref 5–15)
BUN: 76 mg/dL — ABNORMAL HIGH (ref 6–20)
CO2: 21 mmol/L — ABNORMAL LOW (ref 22–32)
Calcium: 9 mg/dL (ref 8.9–10.3)
Chloride: 125 mmol/L — ABNORMAL HIGH (ref 101–111)
Creatinine, Ser: 2.94 mg/dL — ABNORMAL HIGH (ref 0.61–1.24)
GFR calc Af Amer: 20 mL/min — ABNORMAL LOW
GFR calc non Af Amer: 18 mL/min — ABNORMAL LOW
Glucose, Bld: 100 mg/dL — ABNORMAL HIGH (ref 65–99)
Potassium: 4.5 mmol/L (ref 3.5–5.1)
Sodium: 156 mmol/L — ABNORMAL HIGH (ref 135–145)
Total Bilirubin: 0.3 mg/dL (ref 0.3–1.2)
Total Protein: 4.1 g/dL — ABNORMAL LOW (ref 6.5–8.1)

## 2018-01-21 LAB — BASIC METABOLIC PANEL
Anion gap: 9 (ref 5–15)
BUN: 84 mg/dL — AB (ref 6–20)
CO2: 22 mmol/L (ref 22–32)
CREATININE: 3.04 mg/dL — AB (ref 0.61–1.24)
Calcium: 9 mg/dL (ref 8.9–10.3)
Chloride: 125 mmol/L — ABNORMAL HIGH (ref 101–111)
GFR calc Af Amer: 20 mL/min — ABNORMAL LOW (ref >60)
GFR, EST NON AFRICAN AMERICAN: 17 mL/min — AB (ref >60)
Glucose, Bld: 120 mg/dL — ABNORMAL HIGH (ref 65–99)
Potassium: 4.4 mmol/L (ref 3.5–5.1)
SODIUM: 156 mmol/L — AB (ref 135–145)

## 2018-01-21 LAB — GLUCOSE, CAPILLARY
GLUCOSE-CAPILLARY: 75 mg/dL (ref 65–99)
GLUCOSE-CAPILLARY: 76 mg/dL (ref 65–99)
GLUCOSE-CAPILLARY: 93 mg/dL (ref 65–99)
Glucose-Capillary: 97 mg/dL (ref 65–99)
Glucose-Capillary: 82 mg/dL (ref 65–99)
Glucose-Capillary: 98 mg/dL (ref 65–99)

## 2018-01-21 LAB — CBC
HCT: 35.1 % — ABNORMAL LOW (ref 39.0–52.0)
HEMOGLOBIN: 10.9 g/dL — AB (ref 13.0–17.0)
MCH: 30.6 pg (ref 26.0–34.0)
MCHC: 31.1 g/dL (ref 30.0–36.0)
MCV: 98.6 fL (ref 78.0–100.0)
PLATELETS: 99 10*3/uL — AB (ref 150–400)
RBC: 3.56 MIL/uL — ABNORMAL LOW (ref 4.22–5.81)
RDW: 15.6 % — ABNORMAL HIGH (ref 11.5–15.5)
WBC: 11.4 10*3/uL — ABNORMAL HIGH (ref 4.0–10.5)

## 2018-01-21 LAB — CULTURE, BLOOD (ROUTINE X 2)
CULTURE: NO GROWTH
Culture: NO GROWTH
Special Requests: ADEQUATE
Special Requests: ADEQUATE

## 2018-01-21 LAB — DIC (DISSEMINATED INTRAVASCULAR COAGULATION)PANEL
Fibrinogen: 704 mg/dL — ABNORMAL HIGH (ref 210–475)
INR: 1.49
Platelets: 102 10*3/uL — ABNORMAL LOW (ref 150–400)
Smear Review: NONE SEEN
aPTT: 36 seconds (ref 24–36)

## 2018-01-21 LAB — DIC (DISSEMINATED INTRAVASCULAR COAGULATION) PANEL
D DIMER QUANT: 10.7 ug{FEU}/mL — AB (ref 0.00–0.50)
PROTHROMBIN TIME: 17.9 s — AB (ref 11.4–15.2)

## 2018-01-21 MED ORDER — FREE WATER
300.0000 mL | Status: DC
  Administered 2018-01-21 – 2018-01-23 (×11): 300 mL

## 2018-01-21 MED ORDER — VITAL AF 1.2 CAL PO LIQD
1000.0000 mL | ORAL | Status: DC
  Administered 2018-01-22 – 2018-01-23 (×2): 1000 mL

## 2018-01-21 NOTE — Progress Notes (Signed)
PCCM Interval Note  Spoke with the patient's son [name] by phone this morning.  Reviewed his status, progressive renal dysfunction, increased FiO2 and secretions.  We agreed that we will continue all his current support, all medical interventions and attempt to given him opportunity to show [place] if he can stabilize and improve.  Ultimately the goal would be to improve enough to consider gastric surgery.  If he continues to decline, does not rebound then we will have to broach the subject of possible transition to comfort.  In the meantime I have recommended that we defer ACLS should the patient have an acute decompensation.  [name] agrees.  I will place limited code orders in the chart.  Continue to keep him updated daily.  Baltazar Apo, MD, PhD 01/21/2018, 10:36 AM [facility] [phone] or if no answer [phone]

## 2018-01-21 NOTE — Progress Notes (Signed)
Nutrition Follow-up  DOCUMENTATION CODES:   Not applicable  INTERVENTION:    Vital AF 1.2 at 20 ml/h to provide 576 kcal, 36 gm protein, 389 ml free water daily  Consider placing Cortrak tube, tip may be able to be placed post-pyloric; Cortrak service is available Monday, Wednesday, Friday, and Saturday.  NUTRITION DIAGNOSIS:   Inadequate oral intake related to inability to eat as evidenced by NPO status.  Ongoing  GOAL:   Patient will meet greater than or equal to 90% of their needs  Unmet, unable to advance TF to goal rate  MONITOR:   Vent status, TF tolerance, Labs, I & O's  ASSESSMENT:   84 yo male with PMH of prostate cancer, GERD, HTN, HLD, CAD, hypothyroidism, CKD, and A fib & flutter who was admitted on 5/29 with respiratory failure, aspiration PNA, suspected GIB.  Discussed patient in ICU rounds and with RN today. Patient is receiving TF at 20 ml/h, tolerating well. No plans to advance TF rate at this time due to OGT being coiled in upper part of stomach, not in a great location for feeding, and ileus.  Currently receiving Vital AF 1.2 at 20 ml/h; free water flushes 300 ml every 4 hours.  Patient remains intubated on ventilator support MV: 14.2 L/min Temp (24hrs), Avg:98.7 F (37.1 C), Min:87.6 F (30.9 C), Max:99.5 F (37.5 C)  Labs reviewed. Sodium 156 (H) Medications reviewed.   Diet Order:   Diet Order           Diet NPO time specified  Diet effective now          EDUCATION NEEDS:   No education needs have been identified at this time  Skin:  Skin Assessment: Skin Integrity Issues: Skin Integrity Issues:: Stage I, Stage II Stage I: coccyx Stage II: nose  Last BM:  unknown  Height:   Ht Readings from Last 1 Encounters:  01/15/18 5\' 11"  (1.803 m)    Weight:   Wt Readings from Last 1 Encounters:  01/21/18 168 lb 3.4 oz (76.3 kg)    Ideal Body Weight:  78.2 kg  BMI:  Body mass index is 23.46 kg/m.  Estimated Nutritional Needs:    Kcal:  8921  Protein:  100-115 gm  Fluid:  1.8-2 L    Molli Barrows, RD, LDN, [place] Pager [phone] After Hours Pager [phone]

## 2018-01-21 NOTE — Telephone Encounter (Signed)
Aware. 

## 2018-01-21 NOTE — Progress Notes (Signed)
PULMONARY / CRITICAL CARE MEDICINE   Name: [name] MRN: [id number] DOB: [birth year]    ADMISSION DATE:  01/15/2018 CONSULTATION DATE:  01/16/2018  REFERRING MD:  Dr. Stark Jock  CHIEF COMPLAINT:  Acute respiratory failure  BRIEF HISTORY OF PRESENT ILLNESS:   84 y/o male who had recently been discahrged for complaints due to a hiatal hernia was admitted again on 5/29 in the setting of vomiting and aspiraiton pneumonia leading to respiratory failure.  He was noted to have gastritis after admission with bloody stomach secretions.  Large complicated hiatal hernia noted on EGD, associated gastritis  SUBJECTIVE:  Desaturated overnight, currently on FiO2 70%, PEEP of 8 Note progressive thrombus cytopenia Note progressive hypernatremia Initially started enteral diltiazem, held 6/3 afternoon due to relative hypotension  VITAL SIGNS: BP 113/67   Pulse (!) 135   Temp 99.1 F (37.3 C)   Resp (!) 22   Ht 5\' 11"  (1.803 m)   Wt 76.3 kg (168 lb 3.4 oz)   SpO2 94%   BMI 23.46 kg/m   HEMODYNAMICS: CVP:  [6 mmHg-12 mmHg] 11 mmHg  VENTILATOR SETTINGS: Vent Mode: PRVC FiO2 (%):  [70 %-80 %] 70 % Set Rate:  [14 bmp] 14 bmp Vt Set:  [600 mL] 600 mL PEEP:  [8 cmH20] 8 cmH20 Plateau Pressure:  [25 cmH20-30 cmH20] 29 cmH20  INTAKE / OUTPUT: I/O last 3 completed shifts: In: 2370.6 [P.O.:17; I.V.:1358.8; NG/GT:894.8; IV Piggyback:100] Out: 728 [Urine:728]  PHYSICAL EXAMINATION:  General: Elderly, ill-appearing, ventilated HENT: Endotracheal tube in place, no oral lesions noted PULM: Coarse bilateral breath sounds CV: Irregularly irregular, heart rate 120s, 3 out of 6 systolic murmur GI: Soft, mildly distended, hypoactive bowel sounds Derm: Bilateral lower extremity edema Neuro: Awake, opens eyes, try to track, intermittently follows commands  LABS:  BMET Recent Labs  Lab 01/19/18 0950 01/20/18 0441 01/21/18 0509  NA 152* 154* 156*  K 4.6 4.6 4.5  CL 125* 127* 125*  CO2 22 23 21*   BUN 36* 52* 76*  CREATININE 1.58* 2.32* 2.94*  GLUCOSE 94 98 100*    Electrolytes Recent Labs  Lab 01/17/18 0500 01/28/2018 0345 01/19/18 0950 01/20/18 0441 01/21/18 0509  CALCIUM 7.7* 7.7* 8.3* 8.7* 9.0  MG 1.8 1.8  --   --   --   PHOS 3.3 2.3*  --   --   --     CBC Recent Labs  Lab 01/19/18 0419 01/20/18 0441 01/21/18 0509  WBC 15.1* 15.6* 11.4*  HGB 10.7* 11.6* 10.9*  HCT 34.4* 38.4* 35.1*  PLT 177 125* 99*    Coag's No results for input(s): APTT, INR in the last 168 hours.  Sepsis Markers Recent Labs  Lab 01/16/18 0009 01/16/18 0355  LATICACIDVEN 4.24* 1.85    ABG Recent Labs  Lab 01/16/18 0250 01/16/18 1625 01/17/18 0353  PHART 7.298* 7.377 7.336*  PCO2ART 49.5* 36.4 35.2  PO2ART 98.1 56.3* 52.0*    Liver Enzymes Recent Labs  Lab 01/15/18 2327 01/21/18 0509  AST 35 19  ALT 24 13*  ALKPHOS 72 52  BILITOT 1.0 0.3  ALBUMIN 3.6 1.5*    Cardiac Enzymes Recent Labs  Lab 01/15/18 2327 01/17/18 1128  TROPONINI 0.27* 0.32*    Glucose Recent Labs  Lab 01/20/18 1120 01/20/18 1540 01/20/18 1911 01/20/18 2310 01/21/18 0333 01/21/18 0714  GLUCAP 87 123* 102* 80 97 82    Imaging Dg Abd Portable 1v  Result Date: 01/21/2018 CLINICAL DATA:  Nasogastric tube placement EXAM: PORTABLE ABDOMEN -  1 VIEW COMPARISON:  January 19, 2018 FINDINGS: Nasogastric tube tip and side port are present within a sizable paraesophageal hernia. There are loops of mildly dilated bowel without air-fluid levels. No free air. There is moderate stool in the colon. There is atelectatic change in the lung bases. IMPRESSION: Nasogastric tube tip and side port are position within a sizable paraesophageal hernia. Loops of mildly dilated bowel raising question of ileus or potential enteritis. Bowel obstruction felt to be less likely. No evident free air. Electronically Signed   By: Lowella Grip III M.D.   On: 01/21/2018 08:08     STUDIES:  5/26 CT abd > Progressive large  hiatal hernia involving stomach and distal transverse colon. 3 areas of relative luminal narrowing in the stomach may result in a degree of relative obstruction and account for the patient's symptoms. Advanced right hip DJD with effusion and iliopsoas bursitis with effusion. Sigmoid diverticulosis. 5/27 echo: LVEF 60-65%, mild AS, Moderate pulmonary hypertension, mild MR 6/1 EGD: EGD showed reflux esophagitis, large complex hiatal hernia with torsion but no high grade obstruction, chronic gastritis without ulcer or bleeding, normal duodenum  CULTURES:  12/20/2017 blood cultures x2>> 01/16/2018 sputum culture>> normal flora Urine strep ag >> neg  ANTIBIOTICS: Aztreonam , flagyl, and vancomycin given in ED 5/30  01/16/2018 vancomycin>>5/31 01/16/2018 cefepime>> 01/16/2018 Flagyl>> 6/2   SIGNIFICANT EVENTS: 5/26>5/28 admit for chest pain and vomiting felt secondary to hiatal hernia.  5/30 admit to 2104 5/31 AF-RVR >> cardizem gtt  LINES/TUBES: ETT 5/30 > 01/16/2018 NG tube>>   DISCUSSION: 84 y/o male on Eliquis who has aspiration pneumonia and presumably gastritis leading to mild hemorrhagic anemia.  He was found to have a large complicated hiatal hernia on 6/2 which needs surgical correction after he recovers from his aspiration pneumonia.  ASSESSMENT / PLAN:  PULMONARY A: Acute respiratory failure with hypoxemia Aspiration pneumonia Likely acute pulmonary edema, possible ALI P:   Continue to push pulmonary hygiene, secretions appear to be an issue.  Continue acetylcysteine nebs for secretion clearance next 48 hours. Defer diuresis given his progressive renal failure, hypernatremia Not in a position to perform spontaneous breathing trials given his current vent needs.  Work on weaning FiO2 first, continue PEEP 8. Follow chest x-ray VAP prevention order set   CARDIOVASCULAR A:  Multifactorial hypotension, due to sedation, atrial fibrillation, volume status Atrial fibrillation,  heart rate 120s Mild aortic stenosis, mild MR Pulmonary hypertension Demand ischemia P:  Phenylephrine has been weaned to off Continue amiodarone drip Restart diltiazem per tube if blood pressure will tolerate.   RENAL A:   Hypernatremia Hypokalemia AKI, worse over the last 48 hours, now oliguric P:   Follow urine output, BMP Not in a position to diurese 6/4 Increase free water 6/4  GASTROINTESTINAL A:   Hiatal hernia> large, complex, ideally needs surgical correction Gastritis, no bleeding or ulcer GI bleeding, not brisk P:   He will require general surgery evaluation for more definitive therapy for his hiatal hernia.  Not in a position to do so right now, need to get him through this critical illness and assess his suitability for procedure. Continue PPI Continue tube feeding  HEMATOLOGIC A:   Hemorrhagic anemia Thrombocytopenia, progressing 6/4 P:  No clear medications that would cause his thrombocytopenia.  Check DIC panel 6/4 Follow CBC, watch for any evidence of active bleeding given his gastritis noted on EGD Transfuse if Hgb < 7  INFECTIOUS A:   Aspiration pneumonia/ HCAP P:   Empiric cefepime,  day #6 on 6/4 Flagyl stopped   ENDOCRINE A:   No acute issues  P:   Continue to follow glucose, add sliding scale insulin if greater than 180 consistently  NEUROLOGIC A:   Sedation needs for vent synchrony P:   RASS goal -1 Continue same PAD protocol, intermittent fentanyl, intermittent Versed  FAMILY  - Updates: Spoke with son at bedside on 6/3   Independent critical care time 31 minutes   Baltazar Apo, MD, PhD 01/21/2018, 8:31 AM [facility] [phone] or if no answer [phone]

## 2018-01-21 NOTE — Telephone Encounter (Signed)
Please let Lake Bells know that I am aware that his father is in the hospital after this major GI bleed and respiratory failure.  Please have him tell his father that we are keeping him in our prayers that he needs to stay strong and get better from all that has happened to him recently and that we know that he can do this.

## 2018-01-21 NOTE — Telephone Encounter (Signed)
Spoke with Lake Bells , son. He is concerned about his father and just wants to make sure that you know that he is in the hospital and that you're following his care.

## 2018-01-22 ENCOUNTER — Inpatient Hospital Stay (HOSPITAL_COMMUNITY): Payer: Medicare HMO

## 2018-01-22 ENCOUNTER — Ambulatory Visit: Payer: Medicare HMO | Admitting: Cardiology

## 2018-01-22 LAB — GLUCOSE, CAPILLARY
GLUCOSE-CAPILLARY: 77 mg/dL (ref 65–99)
GLUCOSE-CAPILLARY: 81 mg/dL (ref 65–99)
GLUCOSE-CAPILLARY: 81 mg/dL (ref 65–99)
GLUCOSE-CAPILLARY: 78 mg/dL (ref 65–99)
Glucose-Capillary: 85 mg/dL (ref 65–99)

## 2018-01-22 LAB — BASIC METABOLIC PANEL
Anion gap: 6 (ref 5–15)
BUN: 90 mg/dL — AB (ref 6–20)
CO2: 20 mmol/L — ABNORMAL LOW (ref 22–32)
CREATININE: 3.23 mg/dL — AB (ref 0.61–1.24)
Calcium: 9 mg/dL (ref 8.9–10.3)
Chloride: 128 mmol/L — ABNORMAL HIGH (ref 101–111)
GFR calc Af Amer: 18 mL/min — ABNORMAL LOW (ref >60)
GFR, EST NON AFRICAN AMERICAN: 16 mL/min — AB (ref >60)
Glucose, Bld: 92 mg/dL (ref 65–99)
Potassium: 4.5 mmol/L (ref 3.5–5.1)
SODIUM: 154 mmol/L — AB (ref 135–145)

## 2018-01-22 LAB — MAGNESIUM: MAGNESIUM: 2.3 mg/dL (ref 1.7–2.4)

## 2018-01-22 MED ORDER — FENTANYL 2500MCG IN NS 250ML (10MCG/ML) PREMIX INFUSION
25.0000 ug/h | INTRAVENOUS | Status: DC
  Administered 2018-01-22: 25 ug/h via INTRAVENOUS
  Filled 2018-01-22: qty 250

## 2018-01-22 MED ORDER — FENTANYL BOLUS VIA INFUSION
25.0000 ug | INTRAVENOUS | Status: DC | PRN
  Administered 2018-01-23 – 2018-01-24 (×4): 25 ug via INTRAVENOUS
  Filled 2018-01-22: qty 25

## 2018-01-22 MED ORDER — FENTANYL CITRATE (PF) 100 MCG/2ML IJ SOLN
50.0000 ug | Freq: Once | INTRAMUSCULAR | Status: AC
  Administered 2018-01-22: 50 ug via INTRAVENOUS
  Filled 2018-01-22: qty 2

## 2018-01-22 MED ORDER — DEXMEDETOMIDINE HCL IN NACL 400 MCG/100ML IV SOLN
0.0000 ug/kg/h | INTRAVENOUS | Status: DC
  Administered 2018-01-22: 0.4 ug/kg/h via INTRAVENOUS
  Filled 2018-01-22: qty 100

## 2018-01-22 NOTE — Progress Notes (Signed)
Pharmacy Antibiotic Note  AMELIO BROSKY is a 84 y.o. male admitted on 01/15/2018 with pneumonia.  Currently on D#7 of cefepime. WBC improved. Afebrile. Culture negative. SCr has worsened today. CrCl ~  15-20 mL/min  Plan: Cefepime 1 g q24. PCT tomorrow. If neg, consider d/c    Height: 5\' 11"  (180.3 cm) Weight: 178 lb 5.6 oz (80.9 kg) IBW/kg (Calculated) : 75.3  Temp (24hrs), Avg:98.9 F (37.2 C), Min:98.4 F (36.9 C), Max:99.1 F (37.3 C)  Recent Labs  Lab 01/16/18 0009 01/16/18 0355  01/17/18 0500  02/14/2018 0844 01/19/18 0419 01/19/18 0950 01/20/18 0441 01/21/18 0509 01/21/18 1740 01/22/18 0450  WBC  --   --    < > 10.2  --  12.4* 15.1*  --  15.6* 11.4*  --   --   CREATININE  --   --   --  2.05*   < >  --   --  1.58* 2.32* 2.94* 3.04* 3.23*  LATICACIDVEN 4.24* 1.85  --   --   --   --   --   --   --   --   --   --    < > = values in this interval not displayed.    Estimated Creatinine Clearance: 16.8 mL/min (A) (by C-G formula based on SCr of 3.23 mg/dL (H)).    Allergies  Allergen Reactions  . Lipitor [Atorvastatin] Other (See Comments)    "feel bad"  . Doxycycline Rash    Redness to face and hands  . Penicillins Itching    Has patient had a PCN reaction causing immediate rash, facial/tongue/throat swelling, SOB or lightheadedness with hypotension: Yes Has patient had a PCN reaction causing severe rash involving mucus membranes or skin necrosis: No Has patient had a PCN reaction that required hospitalization No Has patient had a PCN reaction occurring within the last 10 years: No If all of the above answers are "NO", then may proceed with Cephalosporin use.    Albertina Parr, PharmD., BCPS Clinical Pharmacist Clinical phone for 01/22/18 until 3:30pm: [phone] If after 3:30pm, please call main pharmacy at: [phone]

## 2018-01-22 NOTE — Progress Notes (Signed)
Cortrak Tube Team Note:  Consult received to place a Cortrak feeding tube.   A 10 F Cortrak tube was placed in the right nare and secured with nasal tape at 55 cm. Radiograph was obtained and it was determined that tube was coiled in the pt's esophageal hernia along with the OG tube. After discussion with radiologist and MD, it was felt there was very little change of post pyloric advancement of the Cortrak tube. Cortrak tube was removed per MD orders.   Koleen Distance MS, RD, LDN Pager #- [phone] Office#- [phone] After Hours Pager: [phone]

## 2018-01-22 NOTE — Progress Notes (Signed)
PULMONARY / CRITICAL CARE MEDICINE   Name: [name] MRN: [id number] DOB: 1927-08-17    ADMISSION DATE:  01/15/2018 CONSULTATION DATE:  01/16/2018  REFERRING MD:  Dr. Stark Jock  CHIEF COMPLAINT:  Acute respiratory failure  BRIEF HISTORY OF PRESENT ILLNESS:   84 y/o male who had recently been discahrged for complaints due to a hiatal hernia was admitted again on 5/29 in the setting of vomiting and aspiraiton pneumonia leading to respiratory failure.  He was noted to have gastritis after admission with bloody stomach secretions.  Large complicated hiatal hernia noted on EGD, associated gastritis  SUBJECTIVE:  No acute events overnight. Remains on vent with increased FiO2.   VITAL SIGNS: BP 100/65   Pulse (!) 112   Temp 98.8 F (37.1 C)   Resp (!) 29   Ht 5\' 11"  (1.803 m)   Wt 80.9 kg (178 lb 5.6 oz)   SpO2 93%   BMI 24.88 kg/m   HEMODYNAMICS: CVP:  [4 mmHg-6 mmHg] 4 mmHg  VENTILATOR SETTINGS: Vent Mode: PRVC FiO2 (%):  [60 %-70 %] 60 % Set Rate:  [14 bmp] 14 bmp Vt Set:  [600 mL] 600 mL PEEP:  [8 cmH20] 8 cmH20 Plateau Pressure:  [24 IRJ18-84 cmH20] 34 cmH20  INTAKE / OUTPUT: I/O last 3 completed shifts: In: 2377.6 [I.V.:1287.8; NG/GT:1089.8] Out: 945 [Urine:945]  PHYSICAL EXAMINATION:  General: Thin elderly male on vent.  HENT: Endotracheal tube in place, no oral lesions noted PULM: bilateral rhonchi CV: Irregularly irregular, heart rate 120s, 3/6 SEM GI: Soft, mildly distended, hypoactive bowel sounds Derm: Bilateral lower extremity edema Neuro: Awake, follows commands. Intermittent agitation.  LABS:  BMET Recent Labs  Lab 01/21/18 0509 01/21/18 1740 01/22/18 0450  NA 156* 156* 154*  K 4.5 4.4 4.5  CL 125* 125* 128*  CO2 21* 22 20*  BUN 76* 84* 90*  CREATININE 2.94* 3.04* 3.23*  GLUCOSE 100* 120* 92    Electrolytes Recent Labs  Lab 01/17/18 0500 01/31/2018 0345  01/21/18 0509 01/21/18 1740 01/22/18 0450  CALCIUM 7.7* 7.7*   < > 9.0 9.0 9.0  MG  1.8 1.8  --   --   --  2.3  PHOS 3.3 2.3*  --   --   --   --    < > = values in this interval not displayed.    CBC Recent Labs  Lab 01/19/18 0419 01/20/18 0441 01/21/18 0509 01/21/18 1112  WBC 15.1* 15.6* 11.4*  --   HGB 10.7* 11.6* 10.9*  --   HCT 34.4* 38.4* 35.1*  --   PLT 177 125* 99* 102*    Coag's Recent Labs  Lab 01/21/18 1112  APTT 36  INR 1.49    Sepsis Markers Recent Labs  Lab 01/16/18 0009 01/16/18 0355  LATICACIDVEN 4.24* 1.85    ABG Recent Labs  Lab 01/16/18 0250 01/16/18 1625 01/17/18 0353  PHART 7.298* 7.377 7.336*  PCO2ART 49.5* 36.4 35.2  PO2ART 98.1 56.3* 52.0*    Liver Enzymes Recent Labs  Lab 01/15/18 2327 01/21/18 0509  AST 35 19  ALT 24 13*  ALKPHOS 72 52  BILITOT 1.0 0.3  ALBUMIN 3.6 1.5*    Cardiac Enzymes Recent Labs  Lab 01/15/18 2327 01/17/18 1128  TROPONINI 0.27* 0.32*    Glucose Recent Labs  Lab 01/21/18 0714 01/21/18 1115 01/21/18 1552 01/21/18 1930 01/21/18 2340 01/22/18 0326  GLUCAP 82 76 75 98 93 85    Imaging Dg Chest Port 1 View  Result Date: 01/22/2018  CLINICAL DATA:  Hypoxia EXAM: PORTABLE CHEST 1 VIEW COMPARISON:  January 20, 2018 FINDINGS: Endotracheal tube tip is 2.7 cm above the carina. Nasogastric tube tip and side port are position within a sizable paraesophageal type hernia, stable. Central catheter tip is in superior vena cava. No pneumothorax. There is widespread interstitial and alveolar opacities bilaterally, stable. Heart is mildly prominent with pulmonary vascularity within normal limits. No adenopathy evident. No appreciable bone lesions. IMPRESSION: Widespread interstitial alveolar opacity. Suspect pulmonary edema with superimposed areas of pneumonia. Stable cardiac silhouette. Tube and catheter positions as described. No evident pneumothorax. No appreciable change from 2 days prior. Electronically Signed   By: Lowella Grip III M.D.   On: 01/22/2018 07:43   Dg Abd Portable  1v  Result Date: 01/21/2018 CLINICAL DATA:  Nasogastric tube placement EXAM: PORTABLE ABDOMEN - 1 VIEW COMPARISON:  January 19, 2018 FINDINGS: Nasogastric tube tip and side port are present within a sizable paraesophageal hernia. There are loops of mildly dilated bowel without air-fluid levels. No free air. There is moderate stool in the colon. There is atelectatic change in the lung bases. IMPRESSION: Nasogastric tube tip and side port are position within a sizable paraesophageal hernia. Loops of mildly dilated bowel raising question of ileus or potential enteritis. Bowel obstruction felt to be less likely. No evident free air. Electronically Signed   By: Lowella Grip III M.D.   On: 01/21/2018 08:08     STUDIES:  5/26 CT abd > Progressive large hiatal hernia involving stomach and distal transverse colon. 3 areas of relative luminal narrowing in the stomach may result in a degree of relative obstruction and account for the patient's symptoms. Advanced right hip DJD with effusion and iliopsoas bursitis with effusion. Sigmoid diverticulosis. 5/27 echo: LVEF 60-65%, mild AS, Moderate pulmonary hypertension, mild MR 6/1 EGD: EGD showed reflux esophagitis, large complex hiatal hernia with torsion but no high grade obstruction, chronic gastritis without ulcer or bleeding, normal duodenum  CULTURES:  12/20/2017 blood cultures x2>> 01/16/2018 sputum culture>> normal flora Urine strep ag >> neg  ANTIBIOTICS: Aztreonam , flagyl, and vancomycin given in ED 5/30  01/16/2018 vancomycin>>5/31 01/16/2018 cefepime>> 01/16/2018 Flagyl>> 6/2   SIGNIFICANT EVENTS: 5/26>5/28 admit for chest pain and vomiting felt secondary to hiatal hernia.  5/30 admit to 2104 5/31 AF-RVR >> cardizem gtt  LINES/TUBES: ETT 5/30 > 01/16/2018 NG tube>>   DISCUSSION: 84 y/o male on Eliquis who has aspiration pneumonia and presumably gastritis leading to mild hemorrhagic anemia.  He was found to have a large complicated hiatal  hernia on 6/2 which needs surgical correction after he recovers from his aspiration pneumonia. Course complicated by failure to wean from vent due to high O2 demands and renal failure.   ASSESSMENT / PLAN:  PULMONARY A: Acute respiratory failure with hypoxemia Aspiration pneumonia  : Complicated by secretions.  Likely acute pulmonary edema, possible ALI P:   Continue acetylcysteine nebs for secretion clearance next 24 hours. Defer diuresis given his progressive renal failure, hypernatremia. Not in a position to perform spontaneous breathing trials given his current vent needs.  Work on weaning FiO2 first, continue PEEP 8. Hopefully can get him more calm and reevaluate later today.  VAP prevention order set  CARDIOVASCULAR A:  Multifactorial hypotension, due to sedation, atrial fibrillation, volume status > resolved Atrial fibrillation, RVR Mild aortic stenosis, mild MR Pulmonary hypertension Demand ischemia P:  Phenylephrine has been weaned to off Continue amiodarone drip Restart diltiazem per tube if blood pressure will tolerate. Will  hold off for now and re-evaluate is BP once agitation s improved.  Cannot anticoagulate due to thrombocytopenia.   RENAL A:   Hypernatremia Hypokalemia AKI, worse over the last 48 hours, now oliguric P:   Follow urine output, BMP Not in a position to diurese 6/4 Continue free water May need nephrology consult, however, seems to be a poor candidate for HD.   GASTROINTESTINAL A:   Hiatal hernia> large, complex, ideally needs surgical correction Gastritis, no bleeding or ulcer GI bleeding, not brisk P:   He will require general surgery evaluation for more definitive therapy for his hiatal hernia.  Not in a position to do so right now, need to get him through this critical illness and assess his suitability for procedure. Continue PPI Continue tube feeding at trickle  HEMATOLOGIC A:   Hemorrhagic anemia Thrombocytopenia: DIC panel sent  6/4 > Fibrinogen high (704).  P:  No clear medications that would cause his thrombocytopenia.   Follow CBC, watch for any evidence of active bleeding given his gastritis noted on EGD Transfuse if Hgb < 7  INFECTIOUS A:   Aspiration pneumonia/ HCAP P:   Empiric cefepime, day #7 on 6/5. All cultures negative. Consider DC abx tomorrow. Will send PCT  ENDOCRINE A:   No acute issues  P:   Continue to follow glucose, add sliding scale insulin if greater than 180 consistently  NEUROLOGIC A:   Sedation needs for vent synchrony P:   RASS goal -1 Intermittent fentanyl If fentanyl not adequate may benefit from Precedex infusion.   FAMILY  - Updates: No family available 6/5  Murvin Gift, AGACNP-BC [facility] Pulmonology/Critical Care Pager [phone] or [phone]  01/22/2018 8:33 AM

## 2018-01-23 DIAGNOSIS — Z515 Encounter for palliative care: Secondary | ICD-10-CM | POA: Insufficient documentation

## 2018-01-23 DIAGNOSIS — N170 Acute kidney failure with tubular necrosis: Secondary | ICD-10-CM

## 2018-01-23 LAB — BASIC METABOLIC PANEL
ANION GAP: 8 (ref 5–15)
BUN: 99 mg/dL — ABNORMAL HIGH (ref 6–20)
CALCIUM: 9.5 mg/dL (ref 8.9–10.3)
CHLORIDE: 126 mmol/L — AB (ref 101–111)
CO2: 21 mmol/L — ABNORMAL LOW (ref 22–32)
CREATININE: 3.4 mg/dL — AB (ref 0.61–1.24)
GFR calc non Af Amer: 15 mL/min — ABNORMAL LOW (ref >60)
GFR, EST AFRICAN AMERICAN: 17 mL/min — AB (ref >60)
Glucose, Bld: 84 mg/dL (ref 65–99)
Potassium: 4.3 mmol/L (ref 3.5–5.1)
SODIUM: 155 mmol/L — AB (ref 135–145)

## 2018-01-23 LAB — CBC
HCT: 31.7 % — ABNORMAL LOW (ref 39.0–52.0)
HEMOGLOBIN: 10.4 g/dL — AB (ref 13.0–17.0)
MCH: 30.6 pg (ref 26.0–34.0)
MCHC: 32.8 g/dL (ref 30.0–36.0)
MCV: 93.2 fL (ref 78.0–100.0)
Platelets: 84 10*3/uL — ABNORMAL LOW (ref 150–400)
RBC: 3.4 MIL/uL — ABNORMAL LOW (ref 4.22–5.81)
RDW: 15.5 % (ref 11.5–15.5)
WBC: 9.6 10*3/uL (ref 4.0–10.5)

## 2018-01-23 LAB — GLUCOSE, CAPILLARY
GLUCOSE-CAPILLARY: 81 mg/dL (ref 65–99)
GLUCOSE-CAPILLARY: 98 mg/dL (ref 65–99)
Glucose-Capillary: 72 mg/dL (ref 65–99)
Glucose-Capillary: 65 mg/dL (ref 65–99)
Glucose-Capillary: 80 mg/dL (ref 65–99)
Glucose-Capillary: 73 mg/dL (ref 65–99)
Glucose-Capillary: 79 mg/dL (ref 65–99)

## 2018-01-23 LAB — MAGNESIUM: MAGNESIUM: 2.4 mg/dL (ref 1.7–2.4)

## 2018-01-23 LAB — PROCALCITONIN: PROCALCITONIN: 4.1 ng/mL

## 2018-01-23 MED ORDER — FUROSEMIDE 10 MG/ML IJ SOLN
60.0000 mg | Freq: Two times a day (BID) | INTRAMUSCULAR | Status: DC
  Administered 2018-01-23: 60 mg via INTRAVENOUS
  Filled 2018-01-23: qty 6

## 2018-01-23 MED ORDER — DEXTROSE 5 % IV SOLN
INTRAVENOUS | Status: DC
  Administered 2018-01-23 – 2018-01-24 (×2): via INTRAVENOUS

## 2018-01-23 MED ORDER — POTASSIUM CHLORIDE 20 MEQ/15ML (10%) PO SOLN
20.0000 meq | Freq: Two times a day (BID) | ORAL | Status: AC
  Administered 2018-01-23 (×2): 20 meq
  Filled 2018-01-23 (×2): qty 15

## 2018-01-23 MED ORDER — FUROSEMIDE 10 MG/ML IJ SOLN
80.0000 mg | Freq: Two times a day (BID) | INTRAMUSCULAR | Status: AC
  Administered 2018-01-23: 80 mg via INTRAVENOUS
  Filled 2018-01-23: qty 8

## 2018-01-23 MED ORDER — FREE WATER
400.0000 mL | Status: DC
  Administered 2018-01-23 – 2018-01-24 (×7): 400 mL

## 2018-01-23 MED ORDER — ALBUMIN HUMAN 5 % IV SOLN
12.5000 g | Freq: Once | INTRAVENOUS | Status: AC
  Administered 2018-01-23: 12.5 g via INTRAVENOUS
  Filled 2018-01-23: qty 250

## 2018-01-23 NOTE — Consult Note (Addendum)
Consultation Note Date: 01/23/2018   Patient Name: [name]  DOB: 07/29/1927  MRN: [id number]  Age / Sex: 84 y.o., male  PCP: Scott Herb, MD Referring Physician: Rigoberto Noel, MD  Reason for Consultation: Establishing goals of care and Psychosocial/spiritual support  HPI/Patient Profile: 84 y.o. male  with past medical history of prostate cancer, CKD 3, afib/CAD, arthritis, large complex hiatal hernia requiring surgery, and some degree of heart failure with mild valvular disease in 4 valves, and moderate pulmonary htn,  who was admitted on 01/15/2018 with vomiting.  He developed respiratory distress secondary to aspiration.  He has been hospitalized for 1 week.  He is currently vented with worsening respiratory status and worsening renal status.  Albumin has dropped to 1.5.  Per RN patient's BP has been in the 70s/40s.  Clinical Assessment and Goals of Care:  I have reviewed medical records including EPIC notes, labs and imaging, received report from CCM, assessed the patient and then met at the bedside along with his son [name] and grand daughter [name]  to discuss diagnosis prognosis, GOC, EOL wishes, disposition and options.  I introduced Palliative Medicine as specialized medical care for people living with serious illness. It focuses on providing relief from the symptoms and stress of a serious illness. The goal is to improve quality of life for both the patient and the family.  We discussed a brief life review of the patient. He is still an active man.  His wife died suddenly about 4 years ago.  Since he has lived alone and done well.  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  He cooks for himself and has remained independent.  He is a huge Paramedic, loves watching the news, eating sweets and listening to the beach boys.  We discussed his current illness and  what it means in the larger context of his on-going co-morbidities.  Natural disease trajectory and expectations at EOL were discussed.  Specifically - we discussed his worsening respiratory failure, kidney failure, tacked on to a very poor nutritional status and advanced age.  His odds of being able to recover are slim at this point.  Further if he were to be able to recover he is at very high risk of recurrence due to his large complex hiatal hernia that likely caused the vomiting and aspiration in the first place.  I attempted to elicit values and goals of care important to the patient.  Scott explained that his father needs to be up and moving about - productive.  He was very unhappy with the short stays he has spent in an SNF (after his CABG and after his knee replacement).  His father does not want to be dependent or live in a facility.  The difference between aggressive medical intervention and comfort care was considered in light of the patient's goals of care.  Scott listened carefully and thoughtfully.  He seemed to need a little time to process the information.  Changing to comfort measures was described.  No changes were made to goals of care at this point.  We agreed to touch base in the morning with CCM and reassess.       Primary Decision Maker:  NEXT OF KIN son, [name].    SUMMARY OF RECOMMENDATIONS    No changes to current treatment.    [name] is concerned that his father not suffer.  He does not want him to have a prolonged illness with little to no chance of recovery.  PMT will follow along with you and speak with the CCM team and Scott again on 6/7.  Code Status/Advance Care Planning:  Patient is currently intubated, but Scott and the CCM team early made him a No code.   Symptom Management:   Patient is very sensitive to medications (fentanyl) causes a drop in BP  Psycho-social/Spiritual:   Desire for further Chaplaincy support: yes  Prognosis: poor.  Likely days in  the setting of respiratory and renal failure.    Discharge Planning: Anticipated Hospital Death      Primary Diagnoses: Present on Admission: . GI bleed   I have reviewed the medical record, interviewed the patient and family, and examined the patient. The following aspects are pertinent.  Past Medical History:  Diagnosis Date  . Arthritis   . Atrial fibrillation and flutter ([place])   . Choledocholithiasis   . CKD (chronic kidney disease) 08/2013   stage 3  . Coronary artery disease    CABG x 4 09/03/13 with LIMA to LAD, SVG to diag, SVG sequential to OM 1 and OM2  . Dysrhythmia    atrial fib  . GERD (gastroesophageal reflux disease)    Large HH with intrathoracic component.   . H/O hiatal hernia   . Hyperlipemia   . Hypertension   . Hypothyroidism   . Kidney disease, chronic, stage III (GFR 30-59 ml/min) ([place]) 04/11/2017  . Pneumonia ~ 1935  . Prostate cancer [facility])    Social History   Socioeconomic History  . Marital status: Widowed    Spouse name: Not on file  . Number of children: 1  . Years of education: Not on file  . Highest education level: Not on file  Occupational History  . Occupation: Retired   Scientific laboratory technician  . Financial resource strain: Not on file  . Food insecurity:    Worry: Not on file    Inability: Not on file  . Transportation needs:    Medical: Not on file    Non-medical: Not on file  Tobacco Use  . Smoking status: Former Smoker    Packs/day: 1.00    Types: Cigarettes    Last attempt to quit: 08/20/1978    Years since quitting: 39.4  . Smokeless tobacco: Never Used  Substance and Sexual Activity  . Alcohol use: No  . Drug use: No  . Sexual activity: Never  Lifestyle  . Physical activity:    Days per week: Not on file    Minutes per session: Not on file  . Stress: Not on file  Relationships  . Social connections:    Talks on phone: Not on file    Gets together: Not on file    Attends religious service: Not on file    Active member  of club or organization: Not on file    Attends meetings of clubs or organizations: Not on file    Relationship status: Not on file  Other Topics Concern  . Not  on file  Social History Narrative   No caffeine    Family History  Problem Relation Age of Onset  . Cancer Mother        bone marrow  . Cancer Father   . Cancer Brother        skin  . Healthy Son   . Colon cancer Neg Hx    Scheduled Meds: . chlorhexidine gluconate (MEDLINE KIT)  15 mL Mouth Rinse BID  . [name] water  400 mL Per Tube Q4H  . furosemide  80 mg Intravenous Q12H  . ipratropium-albuterol  3 mL Nebulization Q6H  . mouth rinse  15 mL Mouth Rinse 10 times per day  . pantoprazole  40 mg Intravenous Q12H  . potassium chloride  20 mEq Per Tube BID  . pramipexole  1.5 mg Oral QHS   Continuous Infusions: . amiodarone 30 mg/hr (01/23/18 1400)  . dextrose 50 mL/hr at 01/23/18 1400  . feeding supplement (VITAL AF 1.2 CAL) 20 mL/hr at 01/23/18 1400  . fentaNYL infusion INTRAVENOUS Stopped (01/23/18 1042)  . sodium chloride     PRN Meds:.acetaminophen, bisacodyl, fentaNYL, midazolam Allergies  Allergen Reactions  . Lipitor [Atorvastatin] Other (See Comments)    "feel bad"  . Doxycycline Rash    Redness to face and hands  . Penicillins Itching    Has patient had a PCN reaction causing immediate rash, facial/tongue/throat swelling, SOB or lightheadedness with hypotension: Yes Has patient had a PCN reaction causing severe rash involving mucus membranes or skin necrosis: No Has patient had a PCN reaction that required hospitalization No Has patient had a PCN reaction occurring within the last 10 years: No If all of the above answers are "NO", then may proceed with Cephalosporin use.    Review of Systems intubated  Physical Exam  Well developed elderly male, awake intubated, arms moving in a non purposeful fashion.  Mitts in place CV brady, irregular with 3/6 murmur Resp intubated.  Abdomen soft, nt, nd  Vital  Signs: BP 90/80   Pulse (!) 51   Temp 97.8 F (36.6 C) (Oral)   Resp (!) 36   Ht 5' 11" (1.803 m)   Wt 82.5 kg (181 lb 14.1 oz)   SpO2 92%   BMI 25.37 kg/m  Pain Scale: CPOT   Pain Score: 0-No pain   SpO2: SpO2: 92 % O2 Device:SpO2: 92 % O2 Flow Rate: .O2 Flow Rate (L/min): 15 L/min  IO: Intake/output summary:   Intake/Output Summary (Last 24 hours) at 01/23/2018 1515 Last data filed at 01/23/2018 1400 Gross per 24 hour  Intake 1687.23 ml  Output 1015 ml  Net 672.23 ml    LBM: Last BM Date: (suppository given 01/19/2018) Baseline Weight: Weight: 64.4 kg (142 lb) Most recent weight: Weight: 82.5 kg (181 lb 14.1 oz)     Palliative Assessment/Data: 20%     Time In: 3:50 Time Out: 5:00 Time Total: 70 min. Greater than 50%  of this time was spent counseling and coordinating care related to the above assessment and plan.  Signed by: Florentina Jenny, PA-C Palliative Medicine Pager: [phone]  Please contact Palliative Medicine Team phone at [phone] for questions and concerns.  For individual provider: See Shea Evans

## 2018-01-23 NOTE — Progress Notes (Signed)
PULMONARY / CRITICAL CARE MEDICINE   Name: [name] MRN: [id number] DOB: 05-10-1927    ADMISSION DATE:  01/15/2018 CONSULTATION DATE:  01/16/2018  REFERRING MD:  Dr. Stark Jock  CHIEF COMPLAINT:  Acute respiratory failure  BRIEF HISTORY OF PRESENT ILLNESS:   84 y/o male who had recently been discahrged for complaints due to a hiatal hernia was admitted again on 5/29 in the setting of vomiting and aspiraiton pneumonia leading to respiratory failure.  He was noted to have gastritis after admission with bloody stomach secretions.  Large complicated hiatal hernia noted on EGD, associated gastritis  SUBJECTIVE:  No significant change today, Sodium about the same. Renal function somewhat worse, although making some more urine. Still requiring 60% and 8 PEEP.   VITAL SIGNS: BP (!) 85/54   Pulse (!) 105   Temp 98.3 F (36.8 C) (Axillary)   Resp (!) 29   Ht 5\' 11"  (1.803 m)   Wt 82.5 kg (181 lb 14.1 oz)   SpO2 94%   BMI 25.37 kg/m   HEMODYNAMICS: CVP:  [4 mmHg-53 mmHg] 11 mmHg  VENTILATOR SETTINGS: Vent Mode: PRVC FiO2 (%):  [60 %-80 %] 80 % Set Rate:  [14 bmp] 14 bmp Vt Set:  [600 mL] 600 mL PEEP:  [8 cmH20] 8 cmH20 Plateau Pressure:  [27 cmH20-31 cmH20] 29 cmH20  INTAKE / OUTPUT: I/O last 3 completed shifts: In: 2653.4 [I.V.:1653.4; NG/GT:1000] Out: 1270 [Urine:1270]  PHYSICAL EXAMINATION:   General: Thin elderly male on vent.  HENT: [place]/AT, PERRL, ETT in place PULM: Coarse bilaterally. High O2 vent support CV: Irregularly irregular, heart rate 120s, 3/6 SEM GI: Soft, mildly distended, hypoactive bowel sounds Derm: Edema in all extremities 2+ Neuro: Awake, follows commands. Intermittent agitation. RASS -1 to 0 range  LABS:  BMET Recent Labs  Lab 01/21/18 1740 01/22/18 0450 01/23/18 0601  NA 156* 154* 155*  K 4.4 4.5 4.3  CL 125* 128* 126*  CO2 22 20* 21*  BUN 84* 90* 99*  CREATININE 3.04* 3.23* 3.40*  GLUCOSE 120* 92 84    Electrolytes Recent Labs  Lab  01/17/18 0500 02/02/2018 0345  01/21/18 1740 01/22/18 0450 01/23/18 0601  CALCIUM 7.7* 7.7*   < > 9.0 9.0 9.5  MG 1.8 1.8  --   --  2.3 2.4  PHOS 3.3 2.3*  --   --   --   --    < > = values in this interval not displayed.    CBC Recent Labs  Lab 01/20/18 0441 01/21/18 0509 01/21/18 1112 01/23/18 0601  WBC 15.6* 11.4*  --  9.6  HGB 11.6* 10.9*  --  10.4*  HCT 38.4* 35.1*  --  31.7*  PLT 125* 99* 102* 84*    Coag's Recent Labs  Lab 01/21/18 1112  APTT 36  INR 1.49    Sepsis Markers No results for input(s): LATICACIDVEN, PROCALCITON, O2SATVEN in the last 168 hours.  ABG Recent Labs  Lab 01/16/18 1625 01/17/18 0353  PHART 7.377 7.336*  PCO2ART 36.4 35.2  PO2ART 56.3* 52.0*    Liver Enzymes Recent Labs  Lab 01/21/18 0509  AST 19  ALT 13*  ALKPHOS 52  BILITOT 0.3  ALBUMIN 1.5*    Cardiac Enzymes Recent Labs  Lab 01/17/18 1128  TROPONINI 0.32*    Glucose Recent Labs  Lab 01/22/18 1552 01/22/18 1952 01/22/18 2342 01/23/18 0350 01/23/18 0535 01/23/18 0722  GLUCAP 81 78 72 65 80 73    Imaging Dg Chest Port 1 [address]  Result Date: 01/22/2018 CLINICAL DATA:  Encounter for feeding tube placement. EXAM: PORTABLE CHEST 1 VIEW COMPARISON:  Portable film earlier in the day. FINDINGS: ETT centrally unchanged, 3.3 cm above carina. Prior CABG. Cardiomegaly. Retrocardiac hiatal hernia with a nasogastric tube coiled within it. Feeding tube has also been placed into the hiatal hernia, but does pass below the diaphragm. BILATERAL pulmonary opacities are redemonstrated, only partially visualized on the LEFT due to positioning, which could represent edema or pneumonia. No visible pneumothorax. Central venous catheter tip proximal SVC. Bowel gas pattern nonspecific. IMPRESSION: Nasogastric tube and feeding tube are coiled within a retrocardiac hiatal hernia. See discussion above. Electronically Signed   By: Staci Righter M.D.   On: 01/22/2018 15:18     STUDIES:   5/26 CT abd > Progressive large hiatal hernia involving stomach and distal transverse colon. 3 areas of relative luminal narrowing in the stomach may result in a degree of relative obstruction and account for the patient's symptoms. Advanced right hip DJD with effusion and iliopsoas bursitis with effusion. Sigmoid diverticulosis. 5/27 echo: LVEF 60-65%, mild AS, Moderate pulmonary hypertension, mild MR 6/1 EGD: EGD showed reflux esophagitis, large complex hiatal hernia with torsion but no high grade obstruction, chronic gastritis without ulcer or bleeding, normal duodenum  CULTURES: 01/16/2018 blood cultures x2> neg 01/16/2018 sputum culture>> normal flora Urine strep ag >> neg  ANTIBIOTICS: Aztreonam , flagyl, and vancomycin given in ED 5/30  01/16/2018 vancomycin > 5/31 01/16/2018 cefepime >  6/6 01/16/2018 Flagyl > 6/2   SIGNIFICANT EVENTS: 5/26>5/28 admit for chest pain and vomiting felt secondary to hiatal hernia.  5/30 admit to 2104 5/31 AF-RVR >> cardizem gtt  LINES/TUBES: ETT 5/30 > 01/16/2018 NG tube>>   DISCUSSION: 84 y/o male on Eliquis who has aspiration pneumonia and presumably gastritis leading to mild hemorrhagic anemia.  He was found to have a large complicated hiatal hernia on 6/2 which needs surgical correction after he recovers from his aspiration pneumonia. Course complicated by failure to wean from vent due to high O2 demands and renal failure with associated hypernatremia  ASSESSMENT / PLAN:  PULMONARY A: Acute respiratory failure with hypoxemia Aspiration pneumonia  : Complicated by secretions.  Likely acute pulmonary edema, possible ALI P:   Defer diuresis given his progressive renal failure, hypernatremia. Not in a position to attempt SBT today.  VAP prevention order set  CARDIOVASCULAR A:  Multifactorial hypotension, due to sedation, atrial fibrillation, volume status > resolved Atrial fibrillation, RVR Mild aortic stenosis, mild MR Pulmonary  hypertension Demand ischemia P:  Continue amiodarone drip BP unable to tolerate diltiazem infusion.  Cannot anticoagulate due to thrombocytopenia.  RENAL A:   Hypernatremia Hypokalemia AKI, worse again today by labs. UOP up somewhat yesterday.  P:   Follow urine output, BMP Will try to diurese with lasix Not responding to free water alone. Increase free water.  Will reluctantly start some D5W.  Not a good candidate for HD.   GASTROINTESTINAL A:   Hiatal hernia> large, complex, ideally needs surgical correction Gastritis, no bleeding or ulcer GI bleeding, not brisk P:   He will require general surgery evaluation for more definitive therapy for his hiatal hernia.  Not in a position to do so right now, need to get him through this critical illness and assess his suitability for procedure. Truthfully its starting to look like this may not be a realistic goal for him.  Continue PPI Continue tube feeding via OGT   HEMATOLOGIC A:   Hemorrhagic anemia Thrombocytopenia:  DIC panel sent 6/4 > Fibrinogen high (704). No clear medications that would cause his thrombocytopenia.   P:  Follow CBC, watch for any evidence of active bleeding given his gastritis noted on EGD Transfuse if Hgb < 7  INFECTIOUS A:   Aspiration pneumonia/ HCAP P:   7 days cefepime. DC abx today.   ENDOCRINE A:   No acute issues  P:   Continue to follow glucose, add sliding scale insulin if greater than 180 consistently  NEUROLOGIC A:   Sedation needs for vent synchrony P:   RASS goal -1 Did not tolerate precedex, so reluctantly transitioned to fentanyl infusion.   FAMILY  - Updates: Family updated 6/6. Long discussion confirming GOC. Continue aggressive medical interventions, but no CPR or shocks. Would need to discuss before starting pressors.   Georgann Housekeeper, AGACNP-BC [facility] Pulmonology/Critical Care Pager [phone] or [phone]  01/23/2018 7:48 AM

## 2018-01-24 DIAGNOSIS — Z515 Encounter for palliative care: Secondary | ICD-10-CM | POA: Insufficient documentation

## 2018-01-24 DIAGNOSIS — K922 Gastrointestinal hemorrhage, unspecified: Secondary | ICD-10-CM

## 2018-01-24 LAB — CBC
HEMATOCRIT: 29.5 % — AB (ref 39.0–52.0)
HEMOGLOBIN: 9.6 g/dL — AB (ref 13.0–17.0)
MCH: 30.2 pg (ref 26.0–34.0)
MCHC: 32.5 g/dL (ref 30.0–36.0)
MCV: 92.8 fL (ref 78.0–100.0)
Platelets: 104 10*3/uL — ABNORMAL LOW (ref 150–400)
RBC: 3.18 MIL/uL — AB (ref 4.22–5.81)
RDW: 15.7 % — ABNORMAL HIGH (ref 11.5–15.5)
WBC: 10.5 10*3/uL (ref 4.0–10.5)

## 2018-01-24 LAB — BASIC METABOLIC PANEL
ANION GAP: 7 (ref 5–15)
BUN: 108 mg/dL — ABNORMAL HIGH (ref 6–20)
CHLORIDE: 120 mmol/L — AB (ref 101–111)
CO2: 21 mmol/L — ABNORMAL LOW (ref 22–32)
Calcium: 9.3 mg/dL (ref 8.9–10.3)
Creatinine, Ser: 3.72 mg/dL — ABNORMAL HIGH (ref 0.61–1.24)
GFR calc Af Amer: 15 mL/min — ABNORMAL LOW (ref >60)
GFR calc non Af Amer: 13 mL/min — ABNORMAL LOW (ref >60)
GLUCOSE: 126 mg/dL — AB (ref 65–99)
POTASSIUM: 4.2 mmol/L (ref 3.5–5.1)
Sodium: 148 mmol/L — ABNORMAL HIGH (ref 135–145)

## 2018-01-24 LAB — PROCALCITONIN: Procalcitonin: 3.41 ng/mL

## 2018-01-24 LAB — GLUCOSE, CAPILLARY
GLUCOSE-CAPILLARY: 73 mg/dL (ref 65–99)
GLUCOSE-CAPILLARY: 104 mg/dL — AB (ref 65–99)
Glucose-Capillary: 87 mg/dL (ref 65–99)

## 2018-01-24 MED ORDER — BIOTENE DRY MOUTH MT LIQD: 15.0000 mL | OROMUCOSAL | Status: DC | PRN

## 2018-01-24 MED ORDER — POLYVINYL ALCOHOL 1.4 % OP SOLN: 1.0000 [drp] | Freq: Four times a day (QID) | OPHTHALMIC | Status: DC | PRN

## 2018-01-24 MED ORDER — AMIODARONE HCL 200 MG PO TABS
200.0000 mg | ORAL_TABLET | Freq: Two times a day (BID) | ORAL | Status: DC
  Administered 2018-01-24: 200 mg
  Filled 2018-01-24: qty 1

## 2018-01-24 MED ORDER — HYDROMORPHONE BOLUS VIA INFUSION: 0.5000 mg | INTRAVENOUS | Status: DC | PRN

## 2018-01-24 MED ORDER — HALOPERIDOL LACTATE 5 MG/ML IJ SOLN: 0.5000 mg | INTRAMUSCULAR | Status: DC | PRN

## 2018-01-24 MED ORDER — LORAZEPAM 2 MG/ML IJ SOLN: 2.0000 mg | INTRAMUSCULAR | Status: DC | PRN

## 2018-01-24 MED ORDER — GLYCOPYRROLATE 1 MG PO TABS: 1.0000 mg | ORAL_TABLET | ORAL | Status: DC | PRN

## 2018-01-24 MED ORDER — HALOPERIDOL 0.5 MG PO TABS: 0.5000 mg | ORAL_TABLET | ORAL | Status: DC | PRN

## 2018-01-24 MED ORDER — GLYCOPYRROLATE 0.2 MG/ML IJ SOLN: 0.2000 mg | INTRAMUSCULAR | Status: DC | PRN

## 2018-01-24 MED ORDER — IPRATROPIUM-ALBUTEROL 0.5-2.5 (3) MG/3ML IN SOLN
3.0000 mL | RESPIRATORY_TRACT | Status: DC | PRN
Start: 2018-01-24 — End: 2018-01-24

## 2018-01-24 MED ORDER — LORAZEPAM 2 MG/ML IJ SOLN
1.0000 mg | Freq: Once | INTRAMUSCULAR | Status: AC
  Administered 2018-01-24: 1 mg via INTRAVENOUS
  Filled 2018-01-24: qty 1

## 2018-01-24 MED ORDER — HALOPERIDOL LACTATE 2 MG/ML PO CONC: 0.5000 mg | ORAL | Status: DC | PRN

## 2018-01-24 MED ORDER — HYDROMORPHONE HCL PF 10 MG/ML IJ SOLN
1.0000 mg/h | INTRAMUSCULAR | Status: DC
  Administered 2018-01-24: 1 mg/h via INTRAVENOUS
  Filled 2018-01-24: qty 2.5

## 2018-01-24 MED ORDER — DEXTROSE 10 % IV SOLN
INTRAVENOUS | Status: DC
  Administered 2018-01-24: 11:00:00 via INTRAVENOUS

## 2018-01-24 MED ORDER — MORPHINE 100MG IN NS 100ML (1MG/ML) PREMIX INFUSION
2.0000 mg/h | INTRAVENOUS | Status: DC
  Filled 2018-01-24: qty 100

## 2018-01-24 MED ORDER — ONDANSETRON 4 MG PO TBDP
4.0000 mg | ORAL_TABLET | Freq: Four times a day (QID) | ORAL | Status: DC | PRN
  Filled 2018-01-24: qty 1

## 2018-01-24 MED ORDER — GLYCOPYRROLATE 0.2 MG/ML IJ SOLN
0.4000 mg | Freq: Three times a day (TID) | INTRAMUSCULAR | Status: DC
  Administered 2018-01-24: 0.4 mg via INTRAVENOUS
  Filled 2018-01-24: qty 2

## 2018-01-24 MED ORDER — HYDROMORPHONE BOLUS VIA INFUSION
0.5000 mg | INTRAVENOUS | Status: DC | PRN
  Administered 2018-01-24: 0.5 mg via INTRAVENOUS
  Filled 2018-01-24: qty 2

## 2018-01-24 MED ORDER — MORPHINE BOLUS VIA INFUSION
4.0000 mg | INTRAVENOUS | Status: DC | PRN
  Filled 2018-01-24: qty 4

## 2018-01-24 MED ORDER — SODIUM CHLORIDE 0.9 % IV SOLN
INTRAVENOUS | Status: DC
  Administered 2018-01-24: 15:00:00 via INTRAVENOUS

## 2018-01-24 MED ORDER — ONDANSETRON HCL 4 MG/2ML IJ SOLN: 4.0000 mg | Freq: Four times a day (QID) | INTRAMUSCULAR | Status: DC | PRN

## 2018-01-29 ENCOUNTER — Telehealth: Payer: Self-pay

## 2018-01-29 NOTE — Telephone Encounter (Signed)
On 01/29/18 I received a d/c from [facility] (original). The d/c is for burial.  The patient is a patient of Doctor Byrum.  The d/c will be taken to Pulmonary Unit @ [facility] for signature.  On 02/03/18 I received the d/c back from Doctor Byrum.  I got the d/c ready and mailed the d/c to [facility] [employer] per the funeral home request.

## 2018-02-13 DIAGNOSIS — J80 Acute respiratory distress syndrome: Secondary | ICD-10-CM | POA: Diagnosis present

## 2018-02-13 DIAGNOSIS — D62 Acute posthemorrhagic anemia: Secondary | ICD-10-CM

## 2018-02-13 DIAGNOSIS — N170 Acute kidney failure with tubular necrosis: Secondary | ICD-10-CM | POA: Diagnosis present

## 2018-02-13 DIAGNOSIS — I214 Non-ST elevation (NSTEMI) myocardial infarction: Secondary | ICD-10-CM

## 2018-02-13 DIAGNOSIS — J690 Pneumonitis due to inhalation of food and vomit: Secondary | ICD-10-CM

## 2018-02-13 DIAGNOSIS — R579 Shock, unspecified: Secondary | ICD-10-CM

## 2018-02-13 DIAGNOSIS — D696 Thrombocytopenia, unspecified: Secondary | ICD-10-CM

## 2018-02-17 NOTE — Progress Notes (Signed)
Wasted 100 mL fentanyl and 45 mL dilaudid down sink with Noberto Retort, Therapist, sports.

## 2018-02-17 NOTE — Progress Notes (Signed)
Called to patients room by palliative Florentina Jenny, PA.  Patient family at bedside.  Patient has no heart sounds, no breath sounds.  Pupils not reactive.  Comforted patient family.  ECG strip timed flat line for 1633.

## 2018-02-17 NOTE — Death Summary Note (Signed)
DEATH SUMMARY   Patient Details  Name: [name] MRN: [id number] DOB: [birth year]  Admission/Discharge Information   Admit Date:  February 06, 2018  Date of Death: Date of Death: 15-Feb-2018  Time of Death: Time of Death: 12/06/31  Length of Stay: 8  Referring Physician: Chipper Herb, MD   Reason(s) for Hospitalization  Acute respiratory failure  Diagnoses  Preliminary cause of death:  ARDS Secondary Diagnoses (including complications and co-morbidities):  Principal Problem:   ARDS (adult respiratory distress syndrome) ([place]) Active Problems:   Prostate cancer ([place])   Paraesophageal hiatal hernia   Hx of CABG   Acute respiratory failure with hypoxemia ([place])   Atrial fibrillation and flutter ([place])   Coronary artery disease   GI bleed   Pressure injury of skin   Acute esophagitis   Acute renal failure with tubular necrosis (HCC)   Acute GI bleeding   Aspiration pneumonia ([place])   Shock ([place])   Acute post-hemorrhagic anemia   Non-ST elevation MI (NSTEMI) ([place])   Acute renal failure due to tubular necrosis (HCC)   Thrombocytopenia [facility])   Brief Hospital Course (including significant findings, care, treatment, and services provided and events leading to death)  [name] was an 84 y/o male who had recently been discahrged for complaints due to a complicated hiatal hernia, also with a history of atrial fibrillation on Eliquis, mild aortic stenosis, pulmonary hypertension.  He was admitted again on 02/07/23 in the setting of vomiting and aspiraiton pneumonia leading to respiratory failure.  He was noted to have gastritis after admission with bloody stomach secretions.  Large complicated hiatal hernia noted on EGD, associated gastritis that was the presumed underlying cause of his aspiration.  He was supported on mechanical ventilation and started on antibiotics for an aspiration pneumonia.  Course complicated by progressive diffuse infiltrates consistent with an acute lung injury.  He had  atrial fibrillation with RVR, anticoagulation was held.  He received amiodarone.  Unfortunately he also developed acute renal failure with aggressive diuretics.  It was hoped that we could stabilize his respiratory and renal function adequately to allow for an extubation and then an evaluation of his hiatal hernia for possible repair.  He unfortunately did not show any improvement even with several days of mechanical ventilation.  His respiratory needs did not change in his renal function progressed.  Discussions were undertaken with his son and transition to comfort care was performed.  This was done on 2018/02/15.  Patient expired later that day.     Pertinent Labs and Studies  Significant Diagnostic Studies Dg Chest 1 View  Result Date: 01/16/2018 CLINICAL DATA:  Attempted central line right side. EXAM: CHEST  1 VIEW COMPARISON:  01/16/2018.  CT 01/12/2018. FINDINGS: Endotracheal tube in stable position. NG tube again noted with coiled in a large hiatal hernia. Cardiomegaly. Unchanged right upper lobe and bibasilar infiltrates. No pleural effusion or pneumothorax. Air-filled loops of bowel noted. No acute bony abnormality. IMPRESSION: 1. Endotracheal tube and NG tube in stable position. NG tube is noted coiled in a hiatal hernia in stable position. 2. Persistent right upper lobe and bibasilar infiltrates. Similar finding noted on prior exam. Electronically Signed   By: Terril   On: 01/16/2018 14:28   Dg Chest Port 1 View  Result Date: 01/22/2018 CLINICAL DATA:  Encounter for feeding tube placement. EXAM: PORTABLE CHEST 1 VIEW COMPARISON:  Portable film earlier in the day. FINDINGS: ETT centrally unchanged, 3.3 cm above carina. Prior CABG. Cardiomegaly. Retrocardiac  hiatal hernia with a nasogastric tube coiled within it. Feeding tube has also been placed into the hiatal hernia, but does pass below the diaphragm. BILATERAL pulmonary opacities are redemonstrated, only partially visualized on the  LEFT due to positioning, which could represent edema or pneumonia. No visible pneumothorax. Central venous catheter tip proximal SVC. Bowel gas pattern nonspecific. IMPRESSION: Nasogastric tube and feeding tube are coiled within a retrocardiac hiatal hernia. See discussion above. Electronically Signed   By: Staci Righter M.D.   On: 01/22/2018 15:18   Dg Chest Port 1 View  Result Date: 01/22/2018 CLINICAL DATA:  Hypoxia EXAM: PORTABLE CHEST 1 VIEW COMPARISON:  January 20, 2018 FINDINGS: Endotracheal tube tip is 2.7 cm above the carina. Nasogastric tube tip and side port are position within a sizable paraesophageal type hernia, stable. Central catheter tip is in superior vena cava. No pneumothorax. There is widespread interstitial and alveolar opacities bilaterally, stable. Heart is mildly prominent with pulmonary vascularity within normal limits. No adenopathy evident. No appreciable bone lesions. IMPRESSION: Widespread interstitial alveolar opacity. Suspect pulmonary edema with superimposed areas of pneumonia. Stable cardiac silhouette. Tube and catheter positions as described. No evident pneumothorax. No appreciable change from 2 days prior. Electronically Signed   By: Lowella Grip III M.D.   On: 01/22/2018 07:43   Dg Chest Port 1 View  Result Date: 01/20/2018 CLINICAL DATA:  Hypoxia EXAM: PORTABLE CHEST 1 VIEW COMPARISON:  January 18, 2018 FINDINGS: Endotracheal tube tip is 1.8 cm above the carina. Central catheter tip is in the superior vena cava. Nasogastric tube tip and side port are within a sizable paraesophageal hernia. No pneumothorax. There is widespread interstitial and alveolar opacity bilaterally, slightly increased compared to most recent study. Mild cardiomegaly is stable. The pulmonary vascularity is within normal limits. No evident adenopathy. No bone lesions. IMPRESSION: Tube and catheter positions as described without pneumothorax. Widespread interstitial and alveolar opacity remains. Suspect a  degree of pulmonary edema with likely superimposed pneumonia in the right upper and lower lobe regions as well as potentially in the left base. Large paraesophageal hernia again noted. Stable cardiac prominence. Electronically Signed   By: Lowella Grip III M.D.   On: 01/20/2018 07:04   Dg Chest Port 1 View  Result Date: 02/16/2018 CLINICAL DATA:  Acute respiratory failure EXAM: PORTABLE CHEST 1 VIEW COMPARISON:  Chest x-rays dated 01/17/2018 and 01/16/2018. FINDINGS: Endotracheal tube appears well positioned with tip above the level of the carina. LEFT IJ central line is stable in position with tip at the level of the upper SVC. Enteric tube has been retracted into the upper mediastinum. The airspace opacities throughout both lungs are not significantly changed compared to yesterday's exam, multifocal pneumonia versus pulmonary edema pattern. No pneumothorax seen. Heart size and mediastinal contours are stable. Large hiatal hernia better demonstrated on previous exams. IMPRESSION: 1. Nasogastric tube has been pulled back with tip now at the level of the upper mediastinum. Recommend repositioning or removal. 2. Remainder of the support apparatus appears appropriately positioned. 3. Airspace opacities throughout both lungs are not significantly changed compared to yesterday's exam, compatible with multifocal pneumonia and/or pulmonary edema. 4. Large hiatal hernia better demonstrated on previous exams. These results will be called to the ordering clinician or representative by the Radiologist Assistant, and communication documented in the PACS or zVision Dashboard. Electronically Signed   By: Franki Cabot M.D.   On: 02/04/2018 07:26   Dg Chest Port 1 View  Result Date: 01/17/2018 CLINICAL DATA:  Respiratory failure.  EXAM: PORTABLE CHEST 1 VIEW COMPARISON:  Radiograph of Jan 16, 2018. FINDINGS: Stable cardiomediastinal silhouette. Status post coronary artery bypass graft. Endotracheal tube is in grossly  good position. Left internal jugular catheter is unchanged. Distal tip of nasogastric tube is seen within hiatal hernia above the diaphragm. Stable bilateral lung opacities are noted, right greater than left concerning for multifocal pneumonia. No pneumothorax is noted. No significant pleural effusion is noted. Bony thorax is unremarkable. IMPRESSION: Distal tip of nasogastric tube is looped within hiatal hernia above the left hemidiaphragm. Stable bilateral lung opacities concerning for multifocal pneumonia. Electronically Signed   By: Marijo Conception, M.D.   On: 01/17/2018 07:58   Dg Chest Port 1 View  Result Date: 01/16/2018 CLINICAL DATA:  History of central venous line caught EXAM: PORTABLE CHEST 1 VIEW COMPARISON:  Portable chest x-ray of 01/16/2018, FINDINGS: The tip of the endotracheal tube is approximately 3.2 cm above the carina. Patchy airspace disease remains throughout the right lung and in the left upper lobe most consistent with multifocal pneumonia. Left central venous line tip overlies the upper SVC. No pneumothorax is seen. Cardiomegaly is stable. Retrocardiac opacity is consistent with moderately large hiatal hernia or compared to CT of 01/12/2018. No bony abnormality is noted. IMPRESSION: 1. Patchy airspace disease right greater than left most consistent with multifocal pneumonia. 2. Tip of endotracheal tube 3.2 cm above the carina. 3. New left central venous line tip overlies the mid upper SVC. No pneumothorax. 4. Moderately large hiatal hernia. Electronically Signed   By: Ivar Drape M.D.   On: 01/16/2018 15:52   Dg Chest Port 1 View  Result Date: 01/16/2018 CLINICAL DATA:  Respiratory failure/hypoxia EXAM: PORTABLE CHEST 1 VIEW COMPARISON:  Study obtained earlier in the day FINDINGS: Endotracheal tube tip is 2.8 cm above the carina. Nasogastric tube tip and side port are and a large paraesophageal hernia. No pneumothorax. There is increase in airspace consolidation in the right upper  lobe. There is extensive airspace consolidation in the right base region. There is atelectatic change in the left base. There is cardiomegaly with pulmonary vascularity normal. Patient is status post coronary artery bypass grafting. There is aortic atherosclerosis. No evident bone lesions. No adenopathy by radiography apparent. IMPRESSION: Tube positions as described without pneumothorax. Note that the nasogastric tube tip and side port are in a large paraesophageal type hernia. Extensive airspace consolidation is noted throughout much of the right lung with increase in opacity in the right upper lobe compared to earlier in the day. Suspect progression of pneumonia in this area, although aspiration is a differential consideration. There is atelectatic change in the left lower lobe region, stable. There is stable cardiac prominence. There is aortic atherosclerosis. Aortic Atherosclerosis (ICD10-I70.0). Electronically Signed   By: Lowella Grip III M.D.   On: 01/16/2018 09:44   Dg Chest Port 1 View  Result Date: 01/16/2018 CLINICAL DATA:  84 y/o  M; ET tube placement. EXAM: PORTABLE CHEST 1 VIEW COMPARISON:  01/15/2018 chest radiograph. FINDINGS: Stable cardiomegaly given projection and technique. Enteric tube tip is coiling within a large hiatal hernia. Endotracheal tube tip projects 2.6 cm above the carina. Increased patchy consolidations of the mid and lower lung zones. Multilevel degenerative changes of the spine. No acute osseous abnormality is evident. IMPRESSION: 1. Endotracheal tube tip 2.6 cm above the carina. 2. Enteric tube tip coiling within large hiatal hernia. 3. Increased consolidation within mid and lower lung zones. Electronically Signed   By: Edgardo Roys.D.  On: 01/16/2018 03:24   Dg Chest Port 1 View  Result Date: 01/15/2018 CLINICAL DATA:  Shortness of breath and vomiting. EXAM: PORTABLE CHEST 1 VIEW COMPARISON:  Jan 12, 2018 FINDINGS: A very large hiatal hernia is  identified. The heart, hila, and mediastinum are unchanged. No pneumothorax. No focal infiltrate in the left lung. Mild opacity in the right mid and lower lung. No other acute abnormalities. IMPRESSION: 1. Large hiatal hernia. 2. Cardiomegaly. 3. Mild new opacity in the right mid and lower lung. Electronically Signed   By: Dorise Bullion III M.D   On: 01/15/2018 23:33   Dg Abd Portable 1v  Result Date: 01/21/2018 CLINICAL DATA:  Nasogastric tube placement EXAM: PORTABLE ABDOMEN - 1 VIEW COMPARISON:  January 19, 2018 FINDINGS: Nasogastric tube tip and side port are present within a sizable paraesophageal hernia. There are loops of mildly dilated bowel without air-fluid levels. No free air. There is moderate stool in the colon. There is atelectatic change in the lung bases. IMPRESSION: Nasogastric tube tip and side port are position within a sizable paraesophageal hernia. Loops of mildly dilated bowel raising question of ileus or potential enteritis. Bowel obstruction felt to be less likely. No evident free air. Electronically Signed   By: Lowella Grip III M.D.   On: 01/21/2018 08:08   Dg Abd Portable 1v  Result Date: 01/19/2018 CLINICAL DATA:  OG tube placement. EXAM: PORTABLE ABDOMEN - 1 VIEW COMPARISON:  CT abdomen dated 01/12/2018. FINDINGS: The OG tube is loosely coiled within the patient's hiatal hernia. Stomach appears to be less distended than on the recent CT. Overall bowel gas pattern appears nonobstructive. Moderate amount of stool within the RIGHT and LEFT colon. IMPRESSION: OG tube is loosely coiled within the patient's herniated stomach fundus (hiatal hernia). Stomach appears to be less distended than on the recent CT of 01/12/2018. Electronically Signed   By: Franki Cabot M.D.   On: 01/19/2018 08:30    Microbiology No results found for this or any previous visit (from the past 240 hour(s)).  Lab Basic Metabolic Panel: No results for input(s): NA, K, CL, CO2, GLUCOSE, BUN, CREATININE,  CALCIUM, MG, PHOS in the last 168 hours. Liver Function Tests: No results for input(s): AST, ALT, ALKPHOS, BILITOT, PROT, ALBUMIN in the last 168 hours. No results for input(s): LIPASE, AMYLASE in the last 168 hours. No results for input(s): AMMONIA in the last 168 hours. CBC: No results for input(s): WBC, NEUTROABS, HGB, HCT, MCV, PLT in the last 168 hours. Cardiac Enzymes: No results for input(s): CKTOTAL, CKMB, CKMBINDEX, TROPONINI in the last 168 hours. Sepsis Labs: No results for input(s): PROCALCITON, WBC, LATICACIDVEN in the last 168 hours.   Baltazar Apo, MD, PhD 02/13/2018, 5:41 PM [facility] [phone] or if no answer [phone]

## 2018-02-17 NOTE — Progress Notes (Signed)
PULMONARY / CRITICAL CARE MEDICINE   Name: [name] MRN: [id number] DOB: 11/06/1926    ADMISSION DATE:  01/15/2018 CONSULTATION DATE:  01/16/2018  REFERRING MD:  Dr. Stark Jock  CHIEF COMPLAINT:  Acute respiratory failure  BRIEF HISTORY OF PRESENT ILLNESS:   84 y/o male who had recently been discahrged for complaints due to a hiatal hernia was admitted again on 5/29 in the setting of vomiting and aspiraiton pneumonia leading to respiratory failure.  He was noted to have gastritis after admission with bloody stomach secretions.  Large complicated hiatal hernia noted on EGD, associated gastritis  SUBJECTIVE:  On fentanyl drip but appears to be less comfortable today Labile blood pressures Increased urine output over the last 24 hours after Lasix given   VITAL SIGNS: BP (!) 93/52   Pulse 63   Temp (!) 97.3 F (36.3 C) (Core)   Resp (!) 0   Ht 5\' 11"  (1.803 m)   Wt 79 kg (174 lb 2.6 oz)   SpO2 90%   BMI 24.29 kg/m   HEMODYNAMICS: CVP:  [6 mmHg] 6 mmHg  VENTILATOR SETTINGS: Vent Mode: PRVC FiO2 (%):  [60 %-80 %] 80 % Set Rate:  [14 bmp] 14 bmp Vt Set:  [600 mL] 600 mL PEEP:  [8 cmH20] 8 cmH20 Plateau Pressure:  [26 cmH20-33 cmH20] 26 cmH20  INTAKE / OUTPUT: I/O last 3 completed shifts: In: 4179.7 [I.V.:2199.7; NG/GT:1980] Out: 2430 [Urine:2430]  PHYSICAL EXAMINATION:  General: Ill-appearing elderly man on mechanical ventilation, uncomfortable HENT: Endotracheal tube in place, oropharynx moist, no oral lesions PULM: Coarse bilateral breath sounds CV: Irregularly irregular, heart rate ranging 696-7 20, soft systolic murmur, bilateral 1+ pedal edema GI: Soft, mildly distended, hypoactive bowel sounds Derm: No rash Neuro: He does turn to voice, grimace with stimulation, did not follow commands today  LABS:  BMET Recent Labs  Lab 01/22/18 0450 01/23/18 0601 Feb 09, 2018 0416  NA 154* 155* 148*  K 4.5 4.3 4.2  CL 128* 126* 120*  CO2 20* 21* 21*  BUN 90* 99* 108*   CREATININE 3.23* 3.40* 3.72*  GLUCOSE 92 84 126*    Electrolytes Recent Labs  Lab 01/22/2018 0345  01/22/18 0450 01/23/18 0601 02/09/18 0416  CALCIUM 7.7*   < > 9.0 9.5 9.3  MG 1.8  --  2.3 2.4  --   PHOS 2.3*  --   --   --   --    < > = values in this interval not displayed.    CBC Recent Labs  Lab 01/21/18 0509 01/21/18 1112 01/23/18 0601 02-09-18 0416  WBC 11.4*  --  9.6 10.5  HGB 10.9*  --  10.4* 9.6*  HCT 35.1*  --  31.7* 29.5*  PLT 99* 102* 84* 104*    Coag's Recent Labs  Lab 01/21/18 1112  APTT 36  INR 1.49    Sepsis Markers Recent Labs  Lab 01/23/18 0601 Feb 09, 2018 0416  PROCALCITON 4.10 3.41    ABG No results for input(s): PHART, PCO2ART, PO2ART in the last 168 hours.  Liver Enzymes Recent Labs  Lab 01/21/18 0509  AST 19  ALT 13*  ALKPHOS 52  BILITOT 0.3  ALBUMIN 1.5*    Cardiac Enzymes Recent Labs  Lab 01/17/18 1128  TROPONINI 0.32*    Glucose Recent Labs  Lab 01/23/18 0722 01/23/18 1154 01/23/18 1938 01/23/18 2303 02/09/18 0336 2018-02-09 0758  GLUCAP 73 81 98 79 87 73    Imaging No results found.   STUDIES:  5/26 CT  abd > Progressive large hiatal hernia involving stomach and distal transverse colon. 3 areas of relative luminal narrowing in the stomach may result in a degree of relative obstruction and account for the patient's symptoms. Advanced right hip DJD with effusion and iliopsoas bursitis with effusion. Sigmoid diverticulosis. 5/27 echo: LVEF 60-65%, mild AS, Moderate pulmonary hypertension, mild MR 6/1 EGD: EGD showed reflux esophagitis, large complex hiatal hernia with torsion but no high grade obstruction, chronic gastritis without ulcer or bleeding, normal duodenum  CULTURES:  12/20/2017 blood cultures x2>> 01/16/2018 sputum culture>> normal flora Urine strep ag >> neg  ANTIBIOTICS: Aztreonam , flagyl, and vancomycin given in ED 5/30  01/16/2018 vancomycin>>5/31 01/16/2018 cefepime>> 01/16/2018 Flagyl>>  6/2   SIGNIFICANT EVENTS: 5/26>5/28 admit for chest pain and vomiting felt secondary to hiatal hernia.  5/30 admit to 2104 5/31 AF-RVR >> cardizem gtt  LINES/TUBES: ETT 5/30 > 01/16/2018 NG tube>>   DISCUSSION: 84 y/o male on Eliquis who has aspiration pneumonia and presumably gastritis leading to mild hemorrhagic anemia.  He was found to have a large complicated hiatal hernia on 6/2 which needs surgical correction after he recovers from his aspiration pneumonia. Course complicated by failure to wean from vent due to high O2 demands and renal failure.   ASSESSMENT / PLAN:  PULMONARY A: Acute respiratory failure with hypoxemia Aspiration pneumonia  : Complicated by secretions.  Likely acute pulmonary edema, possible ALI P:   Stop acetylcysteine nebs Increase diuresis last 24 hours but remains net positive Little progress with regard to his FiO2 and PEEP needs Not in a position to perform spontaneous breathing trials at this time. Unfortunately little progress made over the last 4 to 5 days.  Do not believe that this is recoverable.  Will discuss with his son the transition to comfort measures  CARDIOVASCULAR A:  Multifactorial hypotension, due to sedation, atrial fibrillation, volume status > resolved Atrial fibrillation, RVR Mild aortic stenosis, mild MR Pulmonary hypertension Demand ischemia P:  Currently off pressors Continue amiodarone Deferring anticoagulation.   RENAL A:   Hypernatremia Hypokalemia AKI, worse over the last 48 hours, now oliguric P:   Continue to follow UOP, BMP Hold off on any further diuretics.  Continue free water Not a good HD candidate   GASTROINTESTINAL A:   Hiatal hernia> large, complex, ideally needs surgical correction Gastritis, no bleeding or ulcer GI bleeding, not brisk P:   He would require general surgery evaluation for more definitive therapy for his hiatal hernia.  Not in a position to do so.  Do not believe that he will  ever be in a position to tolerate such a procedure. To new PPI, trickle tube feeds for now.  HEMATOLOGIC A:   Hemorrhagic anemia Thrombocytopenia: DIC panel sent 6/4 > Fibrinogen high (704).  P:  Follow platelets, stable.  No evidence of active bleeding Transfuse if Hgb < 7  INFECTIOUS A:   Aspiration pneumonia/ HCAP P:   Completed cefepime, day #7 on 6/5. All cultures negative.  Following clinically  ENDOCRINE A:   No acute issues  P:   Continue to follow glucose, add sliding scale insulin if greater than 180 consistently  NEUROLOGIC A:   Sedation needs for vent synchrony P:   RASS goal -1 Intermittent fentanyl If fentanyl not adequate may benefit from Precedex infusion.   FAMILY  - Updates: Spoke with patient's son Wes at bedside today.  I have recommended a transition to comfort given his failure to improve, the poor prognosis for meaningful survival  to tolerate an already difficult gastric surgery.  Wes understands.  He is going to speak with his children regarding possible timing for a formal withdrawal of care.  In the meantime I will increase the patient's pain control.  Indicate CODE STATUS as full DNR, defer pressors, invasive interventions, etc.  Independent CC time 35 minutes  Baltazar Apo, MD, PhD 2018/02/02, 10:46 AM [facility] [phone] or if no answer [phone]

## 2018-02-17 NOTE — Procedures (Signed)
Extubation Procedure Note  Patient Details:   Name: [name] DOB: 02/22/1927 MRN: [id number]   Airway Documentation:    Vent end date: 2018/02/10 Vent end time: 1611   Evaluation  O2 sats: stable throughout and currently acceptable Complications: No apparent complications Patient did tolerate procedure well. Bilateral Breath Sounds: Diminished, Rhonchi   No   PT terminally extubated with MD and RN at bedside.  Elwin Mocha February 10, 2018, 4:13 PM

## 2018-02-17 NOTE — Progress Notes (Signed)
Daily Progress Note   Patient Name: [name]       Date: 02-11-18 DOB: 01/19/1927  Age: 84 y.o. MRN#: [id number] Attending Physician: Collene Gobble, MD Primary Care Physician: Chipper Herb, MD Admit Date: 01/15/2018  Reason for Consultation/Follow-up: Establishing goals of care, Psychosocial/spiritual support and Terminal Care  Subjective: Met with son [name] and his family [name], and [name]).  We discussed his father's current situation - worsening renal failure and volume status.  Unfortunately it appears that [name] is dying.  Hypothetically even if he did improve his hiatal hernia would put him back in this position in a very short time period.  [name] and the family opted to shift to comfort measures only and allow his father to die with as much dignity and comfort as possible.  [name] asked to speak with [name] as well and he met with the family again after our conversation.   Assessment: 84 yo male with aspiration pneumonia after hematemesis from GI bleeding due to a large complex hiatal hernia.  He required ventilator support, but his respiratory status did not improve.  His stage 3 CKD worsened significantly and it has become evident that he is now dying.  Length of Stay: 8  Current Medications: Scheduled Meds:  . chlorhexidine gluconate (MEDLINE KIT)  15 mL Mouth Rinse BID  . glycopyrrolate  0.4 mg Intravenous TID  . LORazepam  1 mg Intravenous Once  . mouth rinse  15 mL Mouth Rinse 10 times per day    Continuous Infusions: . sodium chloride    . fentaNYL infusion INTRAVENOUS 50 mcg/hr (02/11/18 1244)  . morphine    . sodium chloride      PRN Meds: acetaminophen, antiseptic oral rinse, fentaNYL, glycopyrrolate **OR** glycopyrrolate **OR** glycopyrrolate,  haloperidol **OR** [DISCONTINUED] haloperidol **OR** haloperidol lactate, ipratropium-albuterol, LORazepam, midazolam, morphine, ondansetron **OR** ondansetron (ZOFRAN) IV, polyvinyl alcohol  Physical Exam        Elderly male, well developed, intubated and sleeping. CV irreg rhythm, murmur 3/6 (seems more pronounced today than yesterday) Resp decreased lung sounds, no distress Extremities 3+ swelling in arms, 2+ in feet bilaterally.  Vital Signs: BP (!) 101/49   Pulse 69   Temp (!) 97.1 F (36.2 C) (Axillary)   Resp 13   Ht '5\' 11"'  (1.803 m)  Wt 79 kg (174 lb 2.6 oz)   SpO2 91%   BMI 24.29 kg/m  SpO2: SpO2: 91 % O2 Device: O2 Device: Ventilator O2 Flow Rate: O2 Flow Rate (L/min): 15 L/min  Intake/output summary:   Intake/Output Summary (Last 24 hours) at 2018/02/15 1448 Last data filed at 02-15-2018 1400 Gross per 24 hour  Intake 3948.06 ml  Output 1425 ml  Net 2523.06 ml   LBM: Last BM Date: (PTA) Baseline Weight: Weight: 64.4 kg (142 lb) Most recent weight: Weight: 79 kg (174 lb 2.6 oz)       Palliative Assessment/Data: 10%    Flowsheet Rows     Most Recent Value  Intake Tab  Referral Department  Critical care  Palliative Care Primary Diagnosis  Cancer  Date Notified  01/23/18  Palliative Care Type  New Palliative care  Reason for referral  Clarify Goals of Care  Date of Admission  01/15/18  Date first seen by Palliative Care  01/23/18  # of days Palliative referral response time  0 Day(s)  # of days IP prior to Palliative referral  8  Clinical Assessment  Psychosocial & Spiritual Assessment  Palliative Care Outcomes      Patient Active Problem List   Diagnosis Date Noted  . Acute renal failure with tubular necrosis ([place])   . Palliative care encounter   . Hematemesis without nausea   . Acute esophagitis   . Pressure injury of skin 01/17/2018  . GI bleed 01/16/2018  . Atypical chest pain 01/12/2018  . GERD (gastroesophageal reflux disease) 01/12/2018    . Atrial fibrillation and flutter ([place]) 01/12/2018  . Coronary artery disease 01/12/2018  . Senile purpura ([place]) 01/02/2018  . Kidney disease, chronic, stage III (GFR 30-59 ml/min) ([place]) 04/11/2017  . Essential hypertension 04/11/2017  . S/P left inguinal hernia repair 10/02/2016  . Neuropathy 05/25/2016  . Aortic atherosclerosis ([place]) 05/25/2016  . Primary osteoarthritis of left knee 09/20/2015  . Anemia, chronic renal failure 04/15/2015  . Hypokalemia 04/15/2015  . Edema 04/12/2015  . Hyperbilirubinemia   . Choledocholithiasis 04/06/2015  . Hypoxia   . Abdominal pain, epigastric   . Abnormal liver enzymes   . Abnormal LFTs   . Biliary sepsis   . Acute respiratory failure with hypoxemia ([place])   . Large Hiatal hernia   . Incisional hernia, without obstruction or gangrene 08/17/2014  . Atrial flutter ([place]) 07/28/2014  . Palpitation 06/16/2014  . Right knee DJD 03/11/2014  . Hx of CABG 09/03/2013  . Insomnia 07/27/2013  . Testosterone deficiency 07/27/2013  . Other hyperlipidemia 06/10/2013  . Heart murmur 06/10/2013  . Dyspnea 06/10/2013  . Inguinal hernia, left. 04/04/2012  . Paraesophageal hiatal hernia 03/24/2012  . Prostate cancer ([place]) 01/15/2012  . Special screening for malignant neoplasms, colon 01/15/2012    Palliative Care Plan    Recommendations/Plan:  Will move forward with a shift to total comfort.  Orders have been adjusted.  Conferred with RN.  Anticipate that patient will pass away shortly in the ICU, however if he survives and stabilizes, consider transfer to 6N for family's comfort.  Goals of Care and Additional Recommendations:  Limitations on Scope of Treatment: Full Comfort Care  Code Status:  DNR  Prognosis:   minutes to hours.  Discharge Planning:  Anticipated Hospital Death  Care plan was discussed with CCM attending, RN, and family.  Thank you for allowing the Palliative Medicine Team to assist in the care of this  patient.  Total time spent:  60 min.     Greater than 50%  of this time was spent counseling and coordinating care related to the above assessment and plan.  Florentina Jenny, PA-C Palliative Medicine  Please contact Palliative MedicineTeam phone at [phone] for questions and concerns between 7 am - 7 pm.   Please see AMION for individual provider pager numbers.

## 2018-02-17 NOTE — Progress Notes (Signed)
   Jan 26, 2018 1600  Clinical Encounter Type  Visited With Patient  Visit Type Initial  Referral From Nurse  Consult/Referral To Chaplain  Spiritual Encounters  Spiritual Needs Emotional;Grief support  Stress Factors  Patient Stress Factors Exhausted  Family Stress Factors Exhausted   Pt was transitioning to comfort care but passed immediately. Family was on-site and tearful. Pt's son is Designer, jewellery. Chaplain provided compassionate presence.  Daizha Anand a Medical sales representative, [employer]

## 2018-02-17 DEATH — deceased

## 2018-03-17 ENCOUNTER — Ambulatory Visit: Payer: Medicare HMO | Admitting: *Deleted

## 2018-05-13 ENCOUNTER — Ambulatory Visit: Payer: Medicare HMO | Admitting: Family Medicine
